# Patient Record
Sex: Female | Born: 1940 | Race: White | Hispanic: No | Marital: Married | Smoking: Never smoker
Health system: Southern US, Community
[De-identification: ages and names within clinical notes are randomized; demographics above are authoritative.]

## PROBLEM LIST (undated history)

## (undated) DIAGNOSIS — K760 Fatty (change of) liver, not elsewhere classified: Secondary | ICD-10-CM

## (undated) DIAGNOSIS — D509 Iron deficiency anemia, unspecified: Secondary | ICD-10-CM

## (undated) DIAGNOSIS — Z86018 Personal history of other benign neoplasm: Secondary | ICD-10-CM

## (undated) DIAGNOSIS — Z87442 Personal history of urinary calculi: Secondary | ICD-10-CM

## (undated) DIAGNOSIS — E119 Type 2 diabetes mellitus without complications: Secondary | ICD-10-CM

## (undated) DIAGNOSIS — Z8639 Personal history of other endocrine, nutritional and metabolic disease: Secondary | ICD-10-CM

## (undated) DIAGNOSIS — G459 Transient cerebral ischemic attack, unspecified: Secondary | ICD-10-CM

## (undated) DIAGNOSIS — I10 Essential (primary) hypertension: Secondary | ICD-10-CM

## (undated) DIAGNOSIS — E559 Vitamin D deficiency, unspecified: Secondary | ICD-10-CM

## (undated) DIAGNOSIS — H9190 Unspecified hearing loss, unspecified ear: Secondary | ICD-10-CM

## (undated) DIAGNOSIS — K269 Duodenal ulcer, unspecified as acute or chronic, without hemorrhage or perforation: Secondary | ICD-10-CM

## (undated) DIAGNOSIS — Z8619 Personal history of other infectious and parasitic diseases: Secondary | ICD-10-CM

## (undated) DIAGNOSIS — A809 Acute poliomyelitis, unspecified: Secondary | ICD-10-CM

## (undated) DIAGNOSIS — K219 Gastro-esophageal reflux disease without esophagitis: Secondary | ICD-10-CM

## (undated) DIAGNOSIS — K259 Gastric ulcer, unspecified as acute or chronic, without hemorrhage or perforation: Secondary | ICD-10-CM

## (undated) DIAGNOSIS — Z9989 Dependence on other enabling machines and devices: Secondary | ICD-10-CM

## (undated) DIAGNOSIS — J302 Other seasonal allergic rhinitis: Secondary | ICD-10-CM

## (undated) DIAGNOSIS — M6281 Muscle weakness (generalized): Secondary | ICD-10-CM

## (undated) DIAGNOSIS — Z8673 Personal history of transient ischemic attack (TIA), and cerebral infarction without residual deficits: Secondary | ICD-10-CM

## (undated) DIAGNOSIS — E039 Hypothyroidism, unspecified: Secondary | ICD-10-CM

## (undated) DIAGNOSIS — B91 Sequelae of poliomyelitis: Secondary | ICD-10-CM

## (undated) DIAGNOSIS — E785 Hyperlipidemia, unspecified: Secondary | ICD-10-CM

## (undated) DIAGNOSIS — M1990 Unspecified osteoarthritis, unspecified site: Secondary | ICD-10-CM

## (undated) DIAGNOSIS — G4733 Obstructive sleep apnea (adult) (pediatric): Secondary | ICD-10-CM

## (undated) DIAGNOSIS — K2970 Gastritis, unspecified, without bleeding: Secondary | ICD-10-CM

## (undated) DIAGNOSIS — I444 Left anterior fascicular block: Secondary | ICD-10-CM

## (undated) DIAGNOSIS — I4510 Unspecified right bundle-branch block: Secondary | ICD-10-CM

## (undated) HISTORY — DX: Fatty (change of) liver, not elsewhere classified: K76.0

## (undated) HISTORY — DX: Vitamin D deficiency, unspecified: E55.9

## (undated) HISTORY — PX: BACK SURGERY: SHX140

## (undated) HISTORY — DX: Hyperlipidemia, unspecified: E78.5

## (undated) HISTORY — PX: OTHER SURGICAL HISTORY: SHX169

## (undated) HISTORY — PX: TONSILLECTOMY: SUR1361

---

## 1964-05-10 HISTORY — PX: OVARIAN CYST SURGERY: SHX726

## 1982-05-10 HISTORY — PX: TOTAL ABDOMINAL HYSTERECTOMY W/ BILATERAL SALPINGOOPHORECTOMY: SHX83

## 1983-05-11 HISTORY — PX: CHOLECYSTECTOMY OPEN: SUR202

## 1997-05-10 HISTORY — PX: KNEE ARTHROSCOPY: SUR90

## 1998-07-14 ENCOUNTER — Ambulatory Visit (HOSPITAL_COMMUNITY): Admission: RE | Admit: 1998-07-14 | Discharge: 1998-07-14 | Payer: Self-pay | Admitting: Obstetrics & Gynecology

## 1998-11-12 ENCOUNTER — Emergency Department (HOSPITAL_COMMUNITY): Admission: EM | Admit: 1998-11-12 | Discharge: 1998-11-12 | Payer: Self-pay | Admitting: *Deleted

## 1999-08-10 ENCOUNTER — Encounter: Admission: RE | Admit: 1999-08-10 | Discharge: 1999-08-10 | Payer: Self-pay | Admitting: Obstetrics and Gynecology

## 1999-08-10 ENCOUNTER — Encounter: Payer: Self-pay | Admitting: Obstetrics and Gynecology

## 2000-03-30 ENCOUNTER — Other Ambulatory Visit: Admission: RE | Admit: 2000-03-30 | Discharge: 2000-03-30 | Payer: Self-pay | Admitting: Obstetrics and Gynecology

## 2000-11-01 ENCOUNTER — Encounter: Payer: Self-pay | Admitting: Emergency Medicine

## 2000-11-01 ENCOUNTER — Observation Stay (HOSPITAL_COMMUNITY): Admission: EM | Admit: 2000-11-01 | Discharge: 2000-11-02 | Payer: Self-pay | Admitting: Emergency Medicine

## 2000-11-17 ENCOUNTER — Encounter: Admission: RE | Admit: 2000-11-17 | Discharge: 2000-11-17 | Payer: Self-pay | Admitting: Family Medicine

## 2001-01-13 ENCOUNTER — Encounter: Admission: RE | Admit: 2001-01-13 | Discharge: 2001-01-13 | Payer: Self-pay | Admitting: Obstetrics and Gynecology

## 2001-01-13 ENCOUNTER — Encounter: Payer: Self-pay | Admitting: Obstetrics and Gynecology

## 2001-06-26 ENCOUNTER — Observation Stay (HOSPITAL_COMMUNITY): Admission: EM | Admit: 2001-06-26 | Discharge: 2001-06-27 | Payer: Self-pay | Admitting: Emergency Medicine

## 2001-06-26 ENCOUNTER — Encounter: Payer: Self-pay | Admitting: Family Medicine

## 2001-06-26 ENCOUNTER — Encounter: Payer: Self-pay | Admitting: Emergency Medicine

## 2001-10-09 ENCOUNTER — Ambulatory Visit (HOSPITAL_BASED_OUTPATIENT_CLINIC_OR_DEPARTMENT_OTHER): Admission: RE | Admit: 2001-10-09 | Discharge: 2001-10-09 | Payer: Self-pay | Admitting: *Deleted

## 2001-10-09 ENCOUNTER — Encounter: Payer: Self-pay | Admitting: Internal Medicine

## 2001-12-05 ENCOUNTER — Encounter: Payer: Self-pay | Admitting: Pulmonary Disease

## 2002-04-20 ENCOUNTER — Encounter: Payer: Self-pay | Admitting: *Deleted

## 2002-04-20 ENCOUNTER — Encounter: Admission: RE | Admit: 2002-04-20 | Discharge: 2002-04-20 | Payer: Self-pay | Admitting: *Deleted

## 2002-04-30 ENCOUNTER — Ambulatory Visit (HOSPITAL_COMMUNITY): Admission: RE | Admit: 2002-04-30 | Discharge: 2002-04-30 | Payer: Self-pay | Admitting: Gastroenterology

## 2003-06-21 ENCOUNTER — Emergency Department (HOSPITAL_COMMUNITY): Admission: EM | Admit: 2003-06-21 | Discharge: 2003-06-21 | Payer: Self-pay | Admitting: Emergency Medicine

## 2004-04-13 ENCOUNTER — Encounter: Admission: RE | Admit: 2004-04-13 | Discharge: 2004-04-13 | Payer: Self-pay | Admitting: *Deleted

## 2005-06-23 ENCOUNTER — Encounter: Admission: RE | Admit: 2005-06-23 | Discharge: 2005-06-23 | Payer: Self-pay | Admitting: Family Medicine

## 2006-05-20 ENCOUNTER — Ambulatory Visit: Payer: Self-pay | Admitting: Pulmonary Disease

## 2006-07-07 ENCOUNTER — Encounter (INDEPENDENT_AMBULATORY_CARE_PROVIDER_SITE_OTHER): Payer: Self-pay | Admitting: Specialist

## 2006-07-07 ENCOUNTER — Ambulatory Visit (HOSPITAL_BASED_OUTPATIENT_CLINIC_OR_DEPARTMENT_OTHER): Admission: RE | Admit: 2006-07-07 | Discharge: 2006-07-07 | Payer: Self-pay | Admitting: Orthopedic Surgery

## 2006-10-27 ENCOUNTER — Encounter: Admission: RE | Admit: 2006-10-27 | Discharge: 2006-10-27 | Payer: Self-pay | Admitting: Family Medicine

## 2007-05-10 ENCOUNTER — Ambulatory Visit (HOSPITAL_BASED_OUTPATIENT_CLINIC_OR_DEPARTMENT_OTHER): Admission: RE | Admit: 2007-05-10 | Discharge: 2007-05-10 | Payer: Self-pay | Admitting: Orthopedic Surgery

## 2007-05-10 HISTORY — PX: CARPAL TUNNEL RELEASE: SHX101

## 2007-06-01 DIAGNOSIS — R079 Chest pain, unspecified: Secondary | ICD-10-CM | POA: Insufficient documentation

## 2007-06-01 DIAGNOSIS — R0602 Shortness of breath: Secondary | ICD-10-CM | POA: Insufficient documentation

## 2007-06-02 ENCOUNTER — Ambulatory Visit: Payer: Self-pay | Admitting: Pulmonary Disease

## 2007-11-23 ENCOUNTER — Encounter: Admission: RE | Admit: 2007-11-23 | Discharge: 2007-11-23 | Payer: Self-pay | Admitting: Family Medicine

## 2008-04-08 ENCOUNTER — Inpatient Hospital Stay (HOSPITAL_COMMUNITY): Admission: RE | Admit: 2008-04-08 | Discharge: 2008-04-11 | Payer: Self-pay | Admitting: Orthopedic Surgery

## 2008-04-08 HISTORY — PX: TOTAL KNEE ARTHROPLASTY: SHX125

## 2008-05-06 ENCOUNTER — Encounter: Admission: RE | Admit: 2008-05-06 | Discharge: 2008-05-09 | Payer: Self-pay | Admitting: Orthopedic Surgery

## 2008-05-10 ENCOUNTER — Encounter: Admission: RE | Admit: 2008-05-10 | Discharge: 2008-06-25 | Payer: Self-pay | Admitting: Orthopedic Surgery

## 2008-05-31 ENCOUNTER — Ambulatory Visit: Payer: Self-pay | Admitting: Pulmonary Disease

## 2008-05-31 DIAGNOSIS — N83209 Unspecified ovarian cyst, unspecified side: Secondary | ICD-10-CM | POA: Insufficient documentation

## 2008-05-31 DIAGNOSIS — Z9089 Acquired absence of other organs: Secondary | ICD-10-CM | POA: Insufficient documentation

## 2008-05-31 DIAGNOSIS — E0590 Thyrotoxicosis, unspecified without thyrotoxic crisis or storm: Secondary | ICD-10-CM | POA: Insufficient documentation

## 2008-05-31 DIAGNOSIS — E785 Hyperlipidemia, unspecified: Secondary | ICD-10-CM

## 2008-05-31 DIAGNOSIS — G4733 Obstructive sleep apnea (adult) (pediatric): Secondary | ICD-10-CM | POA: Insufficient documentation

## 2008-05-31 DIAGNOSIS — E1169 Type 2 diabetes mellitus with other specified complication: Secondary | ICD-10-CM | POA: Insufficient documentation

## 2008-11-25 ENCOUNTER — Encounter: Admission: RE | Admit: 2008-11-25 | Discharge: 2008-11-25 | Payer: Self-pay | Admitting: Family Medicine

## 2009-12-18 ENCOUNTER — Encounter: Admission: RE | Admit: 2009-12-18 | Discharge: 2009-12-18 | Payer: Self-pay | Admitting: Obstetrics and Gynecology

## 2010-03-20 ENCOUNTER — Encounter: Admission: RE | Admit: 2010-03-20 | Discharge: 2010-03-20 | Payer: Self-pay | Admitting: Family Medicine

## 2010-04-17 ENCOUNTER — Emergency Department (HOSPITAL_COMMUNITY)
Admission: EM | Admit: 2010-04-17 | Discharge: 2010-04-17 | Payer: Self-pay | Source: Home / Self Care | Admitting: Emergency Medicine

## 2010-04-30 ENCOUNTER — Encounter
Admission: RE | Admit: 2010-04-30 | Discharge: 2010-04-30 | Payer: Self-pay | Source: Home / Self Care | Attending: Neurosurgery | Admitting: Neurosurgery

## 2010-05-14 ENCOUNTER — Encounter
Admission: RE | Admit: 2010-05-14 | Discharge: 2010-05-14 | Payer: Self-pay | Source: Home / Self Care | Attending: Neurosurgery | Admitting: Neurosurgery

## 2010-06-09 NOTE — Assessment & Plan Note (Signed)
Summary: f/u osa   Chief Complaint:  follow up.  History of Present Illness: the patient comes in today for follow-up of her known obstructive sleep apnea.  She has been wearing her CPAP compliantly, and has no issues with her mask or pressure.  She is sleeping well, and has good daytime alertness. the one  Current Allergies: ! * OXYCODINE ! DOXYCYCLINE      Vital Signs:  Patient Profile:   70 Years Old Female Weight:      237.38 pounds O2 Sat:      97 % O2 treatment:    Room Air Temp:     97.7 degrees F oral Pulse rate:   84 / minute BP sitting:   120 / 76  (left arm)  Vitals Entered By: Cyndia Diver LPN (June 02, 2007 10:29 AM)             Comments yearly follow up. pt states she is using her cpap every night. pt denied any complaints. meds reviewed.      Physical Exam  General:     obese female in no acute distress Nose:     no skin breakdown or pressure necrosis from a CPAP mask     Impression & Recommendations:  Problem # 1:  SLEEP APNEA (ICD-780.57) obstructive sleep apnea which seems to be well controlled on CPAP.  The patient is having no difficulties with the device, but understands that she needs to work aggressively on weight loss.  Follow-up will be in one year or sooner if problems.   Medications Added to Medication List This Visit: 1)  Vitamin E 400 Unit Caps (Vitamin e) .... Take two tabs (total of 800mg ) by mouth once daily 2)  Tricor 145 Mg Tabs (Fenofibrate) .... Take 1 tablet by mouth once a day 3)  Bayer Aspirin 325 Mg Tabs (Aspirin) .... Take 1 tablet by mouth once a day 4)  Multivitamins Tabs (Multiple vitamin) .... Take 1 tablet by mouth once a day 5)  Osteo Bi-flex Regular Strength 250-200 Mg Tabs (Glucosamine-chondroitin) .... Take 1 tablet by mouth two times a day 6)  Mucinex 600 Mg Tb12 (Guaifenesin) .... Take 1 tablet by mouth once a day 7)  Vitamin B Complex Caps (B complex vitamins) .... Take 1 tablet by mouth once a day    Patient Instructions: 1)  Please schedule a follow-up appointment in 1 year.    ]

## 2010-06-17 ENCOUNTER — Other Ambulatory Visit: Payer: Self-pay | Admitting: Neurosurgery

## 2010-06-17 DIAGNOSIS — M549 Dorsalgia, unspecified: Secondary | ICD-10-CM

## 2010-06-17 DIAGNOSIS — N320 Bladder-neck obstruction: Secondary | ICD-10-CM

## 2010-06-17 DIAGNOSIS — M5410 Radiculopathy, site unspecified: Secondary | ICD-10-CM

## 2010-06-22 ENCOUNTER — Ambulatory Visit
Admission: RE | Admit: 2010-06-22 | Discharge: 2010-06-22 | Disposition: A | Discharge status: Home / Self Care | Payer: MEDICARE | Source: Ambulatory Visit | Attending: Neurosurgery | Admitting: Neurosurgery

## 2010-06-22 DIAGNOSIS — M5410 Radiculopathy, site unspecified: Secondary | ICD-10-CM

## 2010-06-22 DIAGNOSIS — M549 Dorsalgia, unspecified: Secondary | ICD-10-CM

## 2010-06-22 DIAGNOSIS — N320 Bladder-neck obstruction: Secondary | ICD-10-CM

## 2010-07-21 LAB — COMPREHENSIVE METABOLIC PANEL
ALT: 41 U/L — ABNORMAL HIGH (ref 0–35)
AST: 22 U/L (ref 0–37)
Albumin: 3.4 g/dL — ABNORMAL LOW (ref 3.5–5.2)
Alkaline Phosphatase: 54 U/L (ref 39–117)
BUN: 25 mg/dL — ABNORMAL HIGH (ref 6–23)
CO2: 24 mEq/L (ref 19–32)
Calcium: 9.5 mg/dL (ref 8.4–10.5)
Chloride: 107 mEq/L (ref 96–112)
Creatinine, Ser: 0.9 mg/dL (ref 0.4–1.2)
GFR calc Af Amer: >60 mL/min (ref >60)
GFR calc non Af Amer: >60 mL/min (ref >60)
Glucose, Bld: 91 mg/dL (ref 70–99)
Potassium: 3.8 mEq/L (ref 3.5–5.1)
Sodium: 140 mEq/L (ref 135–145)
Total Bilirubin: 1 mg/dL (ref 0.3–1.2)
Total Protein: 5.9 g/dL — ABNORMAL LOW (ref 6.0–8.3)

## 2010-07-21 LAB — CBC
HCT: 38.1 % (ref 36.0–46.0)
Hemoglobin: 12.5 g/dL (ref 12.0–15.0)
MCH: 30.6 pg (ref 26.0–34.0)
MCHC: 32.8 g/dL (ref 30.0–36.0)
MCV: 93.4 fL (ref 78.0–100.0)
Platelets: 332 10*3/uL (ref 150–400)
RBC: 4.08 MIL/uL (ref 3.87–5.11)
RDW: 13.7 % (ref 11.5–15.5)
WBC: 21.9 10*3/uL — ABNORMAL HIGH (ref 4.0–10.5)

## 2010-07-21 LAB — URINALYSIS, ROUTINE W REFLEX MICROSCOPIC
Bilirubin Urine: NEGATIVE
Glucose, UA: NEGATIVE mg/dL
Hgb urine dipstick: NEGATIVE
Ketones, ur: NEGATIVE mg/dL
Nitrite: NEGATIVE
Protein, ur: NEGATIVE mg/dL
Specific Gravity, Urine: 1.02 (ref 1.005–1.030)
Urobilinogen, UA: 1 mg/dL (ref 0.0–1.0)
pH: 6 (ref 5.0–8.0)

## 2010-07-21 LAB — URINE MICROSCOPIC-ADD ON

## 2010-07-21 LAB — DIFFERENTIAL
Basophils Absolute: 0 10*3/uL (ref 0.0–0.1)
Basophils Relative: 0 % (ref 0–1)
Eosinophils Absolute: 0.1 10*3/uL (ref 0.0–0.7)
Eosinophils Relative: 1 % (ref 0–5)
Lymphocytes Relative: 23 % (ref 12–46)
Lymphs Abs: 5.1 10*3/uL — ABNORMAL HIGH (ref 0.7–4.0)
Monocytes Absolute: 3.2 10*3/uL — ABNORMAL HIGH (ref 0.1–1.0)
Monocytes Relative: 15 % — ABNORMAL HIGH (ref 3–12)
Neutro Abs: 13.5 10*3/uL — ABNORMAL HIGH (ref 1.7–7.7)
Neutrophils Relative %: 62 % (ref 43–77)

## 2010-07-21 LAB — LIPASE, BLOOD: Lipase: 29 U/L (ref 11–59)

## 2010-08-28 ENCOUNTER — Ambulatory Visit (HOSPITAL_COMMUNITY)
Admission: RE | Admit: 2010-08-28 | Discharge: 2010-08-28 | Disposition: A | Discharge status: Home / Self Care | Payer: Medicare Other | Source: Ambulatory Visit | Attending: Neurosurgery | Admitting: Neurosurgery

## 2010-08-28 ENCOUNTER — Encounter (HOSPITAL_COMMUNITY)
Admission: RE | Admit: 2010-08-28 | Discharge: 2010-08-28 | Disposition: A | Discharge status: Home / Self Care | Payer: Medicare Other | Source: Ambulatory Visit | Attending: Neurosurgery | Admitting: Neurosurgery

## 2010-08-28 ENCOUNTER — Other Ambulatory Visit (HOSPITAL_COMMUNITY): Payer: Self-pay | Admitting: Neurosurgery

## 2010-08-28 DIAGNOSIS — Z01818 Encounter for other preprocedural examination: Secondary | ICD-10-CM | POA: Insufficient documentation

## 2010-08-28 DIAGNOSIS — Z01811 Encounter for preprocedural respiratory examination: Secondary | ICD-10-CM

## 2010-08-28 DIAGNOSIS — Z01812 Encounter for preprocedural laboratory examination: Secondary | ICD-10-CM | POA: Insufficient documentation

## 2010-08-28 DIAGNOSIS — Z01810 Encounter for preprocedural cardiovascular examination: Secondary | ICD-10-CM | POA: Insufficient documentation

## 2010-08-28 LAB — BASIC METABOLIC PANEL
BUN: 14 mg/dL (ref 6–23)
CO2: 27 mEq/L (ref 19–32)
Calcium: 10.4 mg/dL (ref 8.4–10.5)
Chloride: 108 mEq/L (ref 96–112)
Creatinine, Ser: 0.91 mg/dL (ref 0.4–1.2)
GFR calc Af Amer: >60 mL/min (ref >60)
GFR calc non Af Amer: >60 mL/min (ref >60)
Glucose, Bld: 103 mg/dL — ABNORMAL HIGH (ref 70–99)
Potassium: 4 mEq/L (ref 3.5–5.1)
Sodium: 140 mEq/L (ref 135–145)

## 2010-08-28 LAB — CBC
HCT: 39.3 % (ref 36.0–46.0)
Hemoglobin: 13.5 g/dL (ref 12.0–15.0)
MCH: 30.6 pg (ref 26.0–34.0)
MCHC: 34.4 g/dL (ref 30.0–36.0)
MCV: 89.1 fL (ref 78.0–100.0)
Platelets: 315 10*3/uL (ref 150–400)
RBC: 4.41 MIL/uL (ref 3.87–5.11)
RDW: 13.1 % (ref 11.5–15.5)
WBC: 6.6 10*3/uL (ref 4.0–10.5)

## 2010-08-28 LAB — SURGICAL PCR SCREEN
MRSA, PCR: NEGATIVE
Staphylococcus aureus: POSITIVE — AB

## 2010-09-01 ENCOUNTER — Inpatient Hospital Stay (HOSPITAL_COMMUNITY)
Admission: RE | Admit: 2010-09-01 | Discharge: 2010-09-07 | DRG: 460 | Disposition: A | Discharge status: Home Health | Payer: Medicare Other | Source: Ambulatory Visit | Attending: Neurosurgery | Admitting: Neurosurgery

## 2010-09-01 ENCOUNTER — Inpatient Hospital Stay (HOSPITAL_COMMUNITY): Payer: Medicare Other

## 2010-09-01 DIAGNOSIS — M5137 Other intervertebral disc degeneration, lumbosacral region: Secondary | ICD-10-CM | POA: Diagnosis present

## 2010-09-01 DIAGNOSIS — M51379 Other intervertebral disc degeneration, lumbosacral region without mention of lumbar back pain or lower extremity pain: Secondary | ICD-10-CM | POA: Diagnosis present

## 2010-09-01 DIAGNOSIS — Z7982 Long term (current) use of aspirin: Secondary | ICD-10-CM

## 2010-09-01 DIAGNOSIS — G4733 Obstructive sleep apnea (adult) (pediatric): Secondary | ICD-10-CM | POA: Diagnosis present

## 2010-09-01 DIAGNOSIS — M47817 Spondylosis without myelopathy or radiculopathy, lumbosacral region: Secondary | ICD-10-CM | POA: Diagnosis present

## 2010-09-01 DIAGNOSIS — Q762 Congenital spondylolisthesis: Principal | ICD-10-CM

## 2010-09-01 DIAGNOSIS — Z79899 Other long term (current) drug therapy: Secondary | ICD-10-CM

## 2010-09-01 DIAGNOSIS — I10 Essential (primary) hypertension: Secondary | ICD-10-CM | POA: Diagnosis present

## 2010-09-01 HISTORY — PX: LUMBAR SPINE SURGERY: SHX701

## 2010-09-01 LAB — TYPE AND SCREEN
ABO/RH(D): O POS
Antibody Screen: NEGATIVE

## 2010-09-01 LAB — ABO/RH: ABO/RH(D): O POS

## 2010-09-13 NOTE — Op Note (Signed)
NAMEAKHILA, MAHNKEN              ACCOUNT NO.:  [id number]  MEDICAL RECORD NO.:  [id number]           PATIENT TYPE:  I  LOCATION:  3014                         FACILITY:  [facility]  PHYSICIAN:  Hilda Lias, M.D.   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  L3-L4 foraminal stenosis, L5-S1 foraminal stenosis.  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  L3-L4 foraminal stenosis, L5-S1 foraminal stenosis.  PROCEDURE:  L4 Gill procedure with laminectomy, facetectomy, and bilateral L4-5 foraminotomy as well L3 laminectomy with bilateral L3-L4 and L5-S1 foraminotomy, decompression of the L4 and L5 nerve root required for interbody arthrodesis with microdissection, bilateral L4-5 interbody arthrodesis with cages 12 x 22, morselized autograft with Osteocel L4-5 screws, posterolateral arthrodesis with Osteocel and bone marrow aspirate.  Microscope.  SURGEON:  Hilda Lias, MD  ASSISTANT:  Hewitt Shorts, MD  CLINICAL HISTORY:  The patient was admitted because of back pain with radiation to both legs.  The pain is getting worse, is associated with walking.  Clinically, she had failed with conservative treatment.  She has femoral stretch maneuver positive bilaterally.  X-rays show grade 1 spondylolisthesis at the level of L4-L5, foraminal narrowing at the level of 3-4 and L5-S1.  Surgery was advised and the risks were explained to her and her family.  PROCEDURE:  The patient was taken to the OR and she was positioned in prone manner.  The back was cleaned with DuraPrep.  Drapes  were applied. Midline incision was made.  We were unable to feel any bony structure because of her obesity.  Incision was carried out, and muscles were retracted laterally.  We identified the L5-S1 space and we went all the way up to the L3-L4 space.  X-rays were taken which showed that indeed we were at the level of L4-L5.  From then on, we started doing the [place] procedure with removal of the spinous process of L4, the lamina, and the facet all the way laterally to allow [place] to get into the disk space.  The patient has quite a bit of thick yellow ligament.  When we looked at the space above L4, we found that the ligament continued and we decided to proceed with laminectomy of L3 and from then on we were able to do foraminotomy of L3-L4.  While we waited for the C-arm, we proceeded with laminotomy of L5-S1 with removal of part of the yellow ligament and foraminotomy bilaterally to decompress the S1 nerve root.  This procedure was done bilaterally.  Then after we did a facetectomy, we went straight and we opened the disk space first in the right side and then the left in the left side.  Incision was made in the annulus, and using microcurette as well as the pituitary rongeur total diskectomy was accomplished.  Then, we introduced shavers at 8, 10,  and up to 12 to be able to do a total diskectomy.  At the end, we have not only good decompression of the thecal sac but also good decompression of the L4-L5 nerve root.  Then, we retracted the thecal sac on one side and the other one in the end and later in the right side.  We introduced two PEEK implant with Osteocel and autograft inside.  AP and lateral showed good position of the graft.  Then using the C-arm first in AP view and then lateral view, we probed the pedicle of L4 and L5.  Then, we aspirated bone narrow at two levels and we used the bone marrow to mix with the Osteocel and autograft.  Then, we introduced four screws of 5.5 x 45. AP  and lateral showed good position of the pedicle screws.  Then using a rod and four caps, we kept the rod in place.  Once again, the x-rays were done and we had good decompression.  We brought the microscope into the area.  We investigated the L4 and L5 nerve root and we had plenty of space.  Valsalva maneuver was done up to 50 and was negative.  Then, we went laterally and we removed the periosteum of the facet of L4-L5, also part of the takeoff of the transverse ligament.  Then, a mix of autograft, Osteocel, and bone aspirate was used for postop arthrodesis. From then on, the wound was closed with three different layers of Vicryl and the skin with Steri-Strips.  Infiltration of the muscle was done with Sensorcaine 1%.  The patient is going to go to PACU.          ______________________________ Hilda Lias, M.D.     EB/MEDQ  D:  09/01/2010  T:  09/01/2010  Job:  [id number]  Electronically Signed by Hilda Lias M.D. on 09/13/2010 08:11:48 PM

## 2010-09-13 NOTE — Discharge Summary (Signed)
  NAMEKIRSTIE, Karen Potter              ACCOUNT NO.:  [id number]  MEDICAL RECORD NO.:  [id number]           PATIENT TYPE:  I  LOCATION:  3014                         FACILITY:  [facility]  PHYSICIAN:  Hilda Lias, M.D.   DATE OF BIRTH:  [birth year]  DATE OF ADMISSION:  09/01/2010 DATE OF DISCHARGE:  09/07/2010                              DISCHARGE SUMMARY   ADMISSION DIAGNOSIS:  L4-L5 spondylolisthesis with a lumbar stenosis L3- L4 and L4-L5.  FINAL DIAGNOSIS:  L4-L5 spondylolisthesis with a lumbar stenosis L3-L4 and L4-L5.  CLINICAL HISTORY:  The patient was admitted because of back pain with radiation to both legs.  X-ray showed grade 1 spondylolisthesis at the level of L4-L5 with foraminal narrowing at the level of L3-L4 and L5-S1. Surgery was advised.  Laboratory normal.  HOSPITAL COURSE:  The patient was taken to Surgery.  L4-L5 fusion, L3 laminectomy, and L5-S1 foraminotomy was accomplished.  Today, she is feeling better.  She is ambulating.  She had no fever.  The wound was healed.  She is being discharged to be followed by me in my office.  CONDITION ON DISCHARGE:  Improvement.  MEDICATION:  Percocet and diazepam.  DIET:  Regular.  ACTIVITY:  Not to drive.  He is not to do any heavy lifting.  FOLLOWUP:  To be seen by me in 3-4 weeks.          ______________________________ Hilda Lias, M.D.     EB/MEDQ  D:  09/07/2010  T:  09/07/2010  Job:  [id number]  Electronically Signed by Hilda Lias M.D. on 09/13/2010 08:11:50 PM

## 2010-09-22 NOTE — Discharge Summary (Signed)
NAMEGILLIE, Karen Potter              ACCOUNT NO.:  [id number]   MEDICAL RECORD NO.:  [id number]          PATIENT TYPE:  INP   LOCATION:  1620                         FACILITY:  [facility]   PHYSICIAN:  Ollen Gross, M.D.    DATE OF BIRTH:  05-28-1940   DATE OF ADMISSION:  04/08/2008  DATE OF DISCHARGE:  04/11/2008                               DISCHARGE SUMMARY   ADMISSION DIAGNOSES:  1. Osteoarthritis right knee.  2. History of polio left leg.  3. Migraines.  4. History of bronchitis.  5. Hypertension.  6. History of sleep apnea, uses continuous positive airway pressure.  7. Hypercholesterolemia.  8. History of childhood jaundice.  9. Urinary incontinence.  10.Hypothyroidism.  11.Childhood illnesses of measles and mumps.   DISCHARGE DIAGNOSES:  1. Osteoarthritis right knee status post right total knee replacement      arthroplasty.  2. Postoperative hyponatremia, improving.  3. Postoperative hypokalemia, improved.  4. History of polio left leg.  5. Migraines.  6. History of bronchitis.  7. Hypertension.  8. History of sleep apnea, uses continuous positive airway pressure.  9. Hypercholesterolemia.  10.History of childhood jaundice.  11.Urinary incontinence.  12.Hypothyroidism.  13.Childhood illnesses of measles and mumps.   PROCEDURE:  April 08, 2008, right total knee.   SURGEON:  Ollen Gross, M.D.   ASSISTANT:  Jamelle Rushing, P.A.   ANESTHESIA:  General.   TIME:  41 minutes.   CONSULTATIONS:  None.   BRIEF HISTORY:  Karen Potter is a 70 year old female with end-stage  arthritis of the right knee, progressive worsening pain and dysfunction,  failed outpatient management now presents for total knee.   LABORATORY DATA:  Preop CBC showed hemoglobin 13.4, hematocrit 38.9,  white cell count 7.1, platelets 273.  Postop hemoglobin 11.9, came back  to 12.8, last noted H&H 12.4 and 36.3.  PT/PTT preop 13.8 and 27  respectively.  INR 1.0.  Serial pro-times followed  with  Coumadin  protocol.  Last PT/INR 21.1 and 1.7.  Chem panel on admission all within  normal limits.  Serial BMETs were followed.  Sodium did drop from 139 to  132, back up to 133, potassium dropped from 3.5 to 3.3and  back up to  3.8, glucose went up from 104 to 156 and back down to 148.  Preop UA:  Small leukocyte esterase, few epithelials, only 3-6 white cells, only 0-  2 red cells, rare bacteria, otherwise negative.  Blood type O+.   DIAGNOSTICS:  Chest x-ray two-view April 01, 2008:  No active  cardiopulmonary disease.   HOSPITAL COURSE:  The patient admitted to [facility] and  tolerated the procedure well, later transferred to the recovery room on  the orthopedic floor.  Started on PCA and p.o. analgesics.  Given 24  hours postop IV antibiotics.  Started on Coumadin for DVT prophylaxis.  Doing pretty well on the morning of day 1 except for some upper  abdominal pain, leg was doing fine.  Sodium and potassium were a little  low.  Had a little epigastric discomfort.  Denied any chest pain or  shortness of breath.  Patient felt it was some reflux and heartburn, so  we started her on Protonix and provided other p.r.n. meds which did  resolve the issue.  Had excellent urinary output.  Started back on home  meds.  Gave a little potassium for the low potassium level and decreased  the fluids because of  hyponatremia.  By day 2, the patient was doing  well with walking, walking greater 60 feet and then up to 120 feet.  Pain was under better control.  The sodium and potassium had both  improved.  Dressing changed, incision looked good.  Continued to meet  goals with therapy and by April 11, 2008 the sodium was back up.  Hemoglobin was stable, tolerating meds, meeting goals with therapy and  was discharged home.   DISCHARGE/PLAN:  Patient discharged home on May 08, 2008.   DISCHARGE MEDICATIONS:  1. Dilaudid.  2. Robaxin.  3. Imitrex.  4. Coumadin.   DIET:   Heart-healthy diet.   ACTIVITY:  Weightbearing as tolerated total knee protocol.  Home health  PT with home health nursing.   DISPOSITION:  Home.   CONDITION ON DISCHARGE:  Improving.      Alexzandrew L. Perkins, P.A.C.      Ollen Gross, M.D.  Electronically Signed    ALP/MEDQ  D:  05/23/2008  T:  05/23/2008  Job:  [id number]   cc:   Ollen Gross, M.D.  Fax: [phone]   S. Kyra Manges, M.D.  Fax: [phone]   Bennie Pierini, MD

## 2010-09-22 NOTE — Op Note (Signed)
Karen Potter, Karen Potter              ACCOUNT NO.:  [id number]   MEDICAL RECORD NO.:  [id number]          PATIENT TYPE:  INP   LOCATION:  0003                         FACILITY:  [facility]   PHYSICIAN:  Ollen Gross, M.D.    DATE OF BIRTH:  [birth year]   DATE OF PROCEDURE:  04/08/2008  DATE OF DISCHARGE:                               OPERATIVE REPORT   PREOPERATIVE DIAGNOSIS:  Osteoarthritis, right knee.   POSTOPERATIVE DIAGNOSIS/:  Osteoarthritis, right knee.   PROCEDURE:  Right total knee arthroplasty.   SURGEON:  Ollen Gross, M.D.   ASSISTANT:  Jamelle Rushing, P.A.   ANESTHESIA:  General with postop Marcaine pain pump.   ESTIMATED BLOOD LOSS:  Minimal.   DRAINS:  None.   TOURNIQUET TIME:  41 minutes at 300 mmHg.   COMPLICATIONS:  None.   CONDITION:  Stable to recovery.   BRIEF CLINICAL NOTE:  Ms. Trindade is a 70 year old female with end-stage  arthritis to the right knee with progressively worsening pain and  dysfunction.  She has failed nonoperative management and presents for  total knee arthroplasty.   PROCEDURE IN DETAIL:  After successful administration of general  anesthetic a tourniquet was placed high on the right thigh and the right  lower extremity prepped and draped in the usual sterile fashion.  The  extremity was wrapped in an Esmarch, the knee flexed and tourniquet  inflated to 300 mmHg.  A midline incision was made with a 10 blade  through subcutaneous tissue to the level of the extensor mechanism.  A  fresh blade was used to make a medial parapatellar arthrotomy.  The soft  tissue over the proximal medial tibia was subperiosteally elevated to  the joint line with the knife and at the semimembranous bursa with a  Cobb elevator.  The soft tissue laterally was elevated with attention  being paid to avoid the patellar tendon on the tibial tubercle.  The  patella was subluxed laterally and the knee flexed 90 degrees and the  ACL and PCL were removed.  The  drill was used to create a starting hole  in the distal femur.  The canal was thoroughly irrigated.  The 5-degree  right valgus alignment guide was placed.  Referencing off the posterior  condyle rotation was marked and the block pinned to remove 11 mm of the  distal femur.  The distal femoral resection was made with an oscillating  saw.  The sizing block was placed and a size 4 was the most appropriate.  Rotation was marked at the epicondylar axis.  A size 4 cutting block was  placed and the anterior, posterior and chamfer cuts were made.   The tibia was subluxed forward and menisci removed.  The extramedullary  tibial alignment guide was placed, referencing proximally at the medial  aspect of the tibial tubercle and distally along the second metatarsal  axis of the tibial crest.  The block was pinned to remove 10 mm of the  nondeficient lateral side.  The tibial resection was made with an  oscillating saw.  The sizing block was placed.  A size  3 was most  appropriate.  The size 3 template was placed and then proximal tibia was  prepared with the modular drill and keel punch.  The femoral preparation  was completed with the intercondylar cut for the size 4.   A size 3 mobile-bearing tibial trial and a size 4 posterior stabilized  femoral trial and a 10 mm posterior stabilized rotating platform insert  trial were placed.  With the 10 there was a tiny bit of hyperextension,  so we went to 12.5 which allowed for full extension with excellent  varus, valgus, anterior and posterior balance throughout full range of  motion.  The patella was then everted and thickness measured to be 24  mm.  Free-hand resection was taken to 14 mm, a 38 template was placed,  lug holes were drilled, trial patella was placed and it tracks normally.  The osteophytes were removed at the posterior femur with the trial  placed.  All trials were removed and the cut bone surfaces were prepared  with pulsatile lavage.   Cement was mixed and once ready for implantation  the size 3 mobile-bearing tibial tray, size 4 posterior stabilized femur  and 38 patella were cemented into place and the patella was held with a  clamp.  The trial of 12.5 insert was placed with the knee in full  extension and all extruded cement removed.  When the cement was fully  hardened then the permanent 12.5 mm posterior stabilized rotating  platform insert was placed in the tibial tray.  The wound was copiously  irrigated with saline solution and FloSeal injected on the posterior  capsule, medial and lateral gutters and suprapatellar area.  A moist  sponge was placed and the tourniquet released with a total time of 41  minutes.  The sponge was held for 2 minutes and removed.  Minimal  bleeding was encountered.  The bleeding that was encountered was stopped  with electrocautery.  The wound was again irrigated and the arthrotomy  closed with interrupted #1 PDS.  Flexion against gravity was 140  degrees.  The subcutaneous was then closed with interrupted 2-0 Vicryl  and subcuticular running 4-0 Monocryl.  The catheter for the Marcaine  pain pump was placed and the pump was initiated.  Steri-Strips and a  bulky sterile dressing were applied and then she was placed into a knee  immobilizer, awakened and transported to recovery in stable condition.      Ollen Gross, M.D.  Electronically Signed     FA/MEDQ  D:  04/08/2008  T:  04/08/2008  Job:  [id number]

## 2010-09-22 NOTE — Op Note (Signed)
Karen Potter, Karen Potter              ACCOUNT NO.:  [id number]   MEDICAL RECORD NO.:  [id number]          PATIENT TYPE:  AMB   LOCATION:  DSC                          FACILITY:  [facility]   PHYSICIAN:  Cindee Salt, M.D.       DATE OF BIRTH:  [birth year]   DATE OF PROCEDURE:  05/10/2007  DATE OF DISCHARGE:                               OPERATIVE REPORT   PREOPERATIVE DIAGNOSIS:  Carpal tunnel syndrome, right hand.   POSTOPERATIVE DIAGNOSIS:  Carpal tunnel syndrome, right hand.   OPERATION:  Decompression of right median nerve.   SURGEON:  Kuzma   ASSISTANT:  __________   ANESTHESIA:  Forearm-based IV regional.   ANESTHESIOLOGIST:  Singer   HISTORY:  The patient is a 70 year old female with a history of carpal  tunnel syndrome, EMG nerve conduction positive, that has not responded  to conservative treatment.  She has elected to undergo surgical  decompression of this.  She is aware of risks and complications  including infection; recurrence; injury to arteries, nerves, or tendons;  incomplete relief of symptoms; and dystrophy.  Questions have been  encouraged and answered.  In the Preoperative Area the patient was seen  and the extremity marked by both the patient and surgeon.  Questions  were again encouraged and answered to her satisfaction.   PROCEDURE:  The patient was brought to the Operating Room, where a  forearm-based IV regional anesthetic was carried out without difficulty.  She was prepped using DuraPrep in the supine position with the right arm  free.  A longitudinal incision was made in the palm and carried down  through subcutaneous tissue.  Bleeders were electrocauterized.  The  palmar fascia was split, and the superficial palmar arch was identified.  The flexor tendon to the ring and little finger identified tothe ulnar  side of the median nerve.  The carpal retinaculum was incised with sharp  dissection, right angle and Sewall retractor placed between the skin and  forearm fascia.  The fascia was released for approximately 1.5 cm  proximal to the wrist crease under direct vision.  The canal was  explored.  Area of compression to the nerve was apparent.  No further  lesions were identified.  The wound was irrigated and the skin closed  with interrupted 5-0 Vicryl Rapide sutures.  A sterile compressive  dressing and splint were applied to the wrist with fingers free.  The  patient tolerated the procedure well and was taken to the Recovery Room  for observation in satisfactory condition.  She is discharged home.  Return to __________ in [place] in one week on [place] [address].          ______________________________  Cindee Salt, M.D.    GK/MEDQ  D:  05/10/2007  T:  05/10/2007  Job:  [id number]

## 2010-09-25 NOTE — H&P (Signed)
NAMEJAVAEH, Karen Potter              ACCOUNT NO.:  [id number]   MEDICAL RECORD NO.:  [id number]          PATIENT TYPE:  INP   LOCATION:  NA                           FACILITY:  [facility]   PHYSICIAN:  Ollen Gross, M.D.    DATE OF BIRTH:  31-Mar-1941   DATE OF ADMISSION:  04/08/2008  DATE OF DISCHARGE:                              HISTORY & PHYSICAL   CHIEF COMPLAINT:  Right knee pain.   HISTORY OF PRESENT ILLNESS:  The patient is a 70 year old female who has  seen by Dr. Lequita Halt for ongoing right knee pain for quite some time now.  She initially had a knee scope back in 1999 per Dr. Rennis Chris.  Did well  for that for about 3 or 4 years.  For the past couple of years though  the knee has been starting to get worse.  She has been seen in followup  by Dr. 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  Risks and benefits discussed.  The patient is  subsequently admitted to the hospital.   ALLERGIES:  OXYCODONE causes itching, and questionable reaction to  DOXYCYCLINE.   CURRENT MEDICATIONS:  1. Levothyroxine 150 mcg.  2. Indapamide 25 mg.  3. Tricor 145 mg.  4. Propionate ointment.  5. Aspirin.  6. Vitamin D.  7. Estra-C.  8. Vitamin E.  9. Slow magnesium.  10.Osteo Bi-Flex.  11.Multivitamin.  12.Potassium gluconate.  13.Mucinex.   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  Elevated cholesterol, history of childhood jaundice,  urinary incontinence, hypothyroidism, childhood illnesses of measles and  mumps.   PAST SURGICAL HISTORY:  Ovarian cyst surgery, hysterectomy, gallbladder,  heel surgery secondary to polio, ankle surgery, right knee surgery.   FAMILY HISTORY:  Father deceased age 53 with lung cancer.  Mother  deceased age 12 with breathing  problems and heart disease.  Brother is  living at 65 with lung cancer.   SOCIAL HISTORY:  Married, retired nonsmoker.  No alcohol.  Has 2 sons  and 2 adopted daughters.  Husband and daughter will be assisting with  care after surgery.  She does have a living will.   REVIEW OF SYSTEMS:  GENERAL:  No fevers, chills, or night sweats.  NEURO:  There is a little bit of hearing loss, balance problems.  No  seizures, syncope, or paralysis.  RESPIRATORY:  A little bit of  shortness breath on exertion.  No shortness of breath at rest.  No  productive cough or hemoptysis.  CARDIOVASCULAR:  No chest pain, angina,  or orthopnea.  GI:  No nausea, vomiting, diarrhea, or constipation.  GU:  No dysuria, hematuria, or discharge.  MUSCULOSKELETAL:  Knee pain and  swelling.   PHYSICAL EXAMINATION:  VITAL SIGNS:  Pulse 74, respiration 16, blood  pressure 140/84.  GENERAL:  A 70 year old white female well nourished, well developed,  overweight, no acute distress.  She is alert, oriented, and cooperative,  pleasant.  Good historian.  HEENT:  Normocephalic, atraumatic.  Pupils are reactive.  Oropharynx  clear.  EOMs intact.  NECK:  Supple.  CHEST:  Clear.  HEART:  Regular rate and rhythm.  No murmur.  ABDOMEN:  Protuberant abdomen.  Bowel sounds present, nontender.  RECTAL, BREASTS, GENITALIA:  Not done, not pertinent to present illness.  EXTREMITIES:  Right knee range of motion 5-120, varus deformity, tender  more medial than lateral.  No instability.   IMPRESSION:  Osteoarthritis right knee.   PLAN:  The patient will be admitted to [facility] to undergo a  right total knee surgery, will be performed by Dr. Ollen Gross.      Alexzandrew L. Perkins, P.A.C.      Ollen Gross, M.D.  Electronically Signed    ALP/MEDQ  D:  04/07/2008  T:  04/07/2008  Job:  [id number]   cc:   S. Kyra Manges, M.D.  Fax: [phone]   Bennie Pierini, M.D.

## 2010-11-23 ENCOUNTER — Ambulatory Visit: Payer: Medicare Other | Attending: Neurosurgery | Admitting: Physical Therapy

## 2010-11-23 DIAGNOSIS — IMO0001 Reserved for inherently not codable concepts without codable children: Secondary | ICD-10-CM | POA: Insufficient documentation

## 2010-11-23 DIAGNOSIS — M545 Low back pain, unspecified: Secondary | ICD-10-CM | POA: Insufficient documentation

## 2010-11-23 DIAGNOSIS — R5381 Other malaise: Secondary | ICD-10-CM | POA: Insufficient documentation

## 2010-11-25 ENCOUNTER — Ambulatory Visit: Payer: Medicare Other | Admitting: Physical Therapy

## 2010-12-30 ENCOUNTER — Other Ambulatory Visit: Payer: Self-pay | Admitting: Family Medicine

## 2010-12-30 DIAGNOSIS — Z139 Encounter for screening, unspecified: Secondary | ICD-10-CM

## 2011-01-14 ENCOUNTER — Ambulatory Visit (HOSPITAL_COMMUNITY)
Admission: RE | Admit: 2011-01-14 | Discharge: 2011-01-14 | Disposition: A | Discharge status: Home / Self Care | Payer: Medicare Other | Source: Ambulatory Visit | Attending: Family Medicine | Admitting: Family Medicine

## 2011-01-14 DIAGNOSIS — Z1231 Encounter for screening mammogram for malignant neoplasm of breast: Secondary | ICD-10-CM | POA: Insufficient documentation

## 2011-01-14 DIAGNOSIS — Z139 Encounter for screening, unspecified: Secondary | ICD-10-CM

## 2011-02-09 LAB — TYPE AND SCREEN
ABO/RH(D): O POS
Antibody Screen: NEGATIVE

## 2011-02-09 LAB — URINALYSIS, ROUTINE W REFLEX MICROSCOPIC
Bilirubin Urine: NEGATIVE
Glucose, UA: NEGATIVE mg/dL
Hgb urine dipstick: NEGATIVE
Ketones, ur: NEGATIVE mg/dL
Nitrite: NEGATIVE
Protein, ur: NEGATIVE mg/dL
Specific Gravity, Urine: 1.017 (ref 1.005–1.030)
Urobilinogen, UA: 1 mg/dL (ref 0.0–1.0)
pH: 5.5 (ref 5.0–8.0)

## 2011-02-09 LAB — CBC
HCT: 38.9 % (ref 36.0–46.0)
Hemoglobin: 13.4 g/dL (ref 12.0–15.0)
MCHC: 34.5 g/dL (ref 30.0–36.0)
MCV: 90.1 fL (ref 78.0–100.0)
Platelets: 273 10*3/uL (ref 150–400)
RBC: 4.32 MIL/uL (ref 3.87–5.11)
RDW: 13.9 % (ref 11.5–15.5)
WBC: 7.1 10*3/uL (ref 4.0–10.5)

## 2011-02-09 LAB — COMPREHENSIVE METABOLIC PANEL
ALT: 29 U/L (ref 0–35)
AST: 30 U/L (ref 0–37)
Albumin: 3.9 g/dL (ref 3.5–5.2)
Alkaline Phosphatase: 61 U/L (ref 39–117)
BUN: 12 mg/dL (ref 6–23)
CO2: 26 mEq/L (ref 19–32)
Calcium: 10 mg/dL (ref 8.4–10.5)
Chloride: 105 mEq/L (ref 96–112)
Creatinine, Ser: 0.88 mg/dL (ref 0.4–1.2)
GFR calc Af Amer: >60 mL/min (ref >60)
GFR calc non Af Amer: >60 mL/min (ref >60)
Glucose, Bld: 104 mg/dL — ABNORMAL HIGH (ref 70–99)
Potassium: 3.5 mEq/L (ref 3.5–5.1)
Sodium: 139 mEq/L (ref 135–145)
Total Bilirubin: 0.8 mg/dL (ref 0.3–1.2)
Total Protein: 7.5 g/dL (ref 6.0–8.3)

## 2011-02-09 LAB — URINE MICROSCOPIC-ADD ON

## 2011-02-09 LAB — ABO/RH: ABO/RH(D): O POS

## 2011-02-09 LAB — PROTIME-INR
INR: 1 (ref 0.00–1.49)
Prothrombin Time: 13.8 seconds (ref 11.6–15.2)

## 2011-02-09 LAB — APTT: aPTT: 27 seconds (ref 24–37)

## 2011-02-12 LAB — CBC
HCT: 35 % — ABNORMAL LOW (ref 36.0–46.0)
HCT: 36.3 % (ref 36.0–46.0)
HCT: 36.5 % (ref 36.0–46.0)
Hemoglobin: 11.9 g/dL — ABNORMAL LOW (ref 12.0–15.0)
Hemoglobin: 12.4 g/dL (ref 12.0–15.0)
Hemoglobin: 12.8 g/dL (ref 12.0–15.0)
MCHC: 34.1 g/dL (ref 30.0–36.0)
MCHC: 34.3 g/dL (ref 30.0–36.0)
MCHC: 35 g/dL (ref 30.0–36.0)
MCV: 91.3 fL (ref 78.0–100.0)
MCV: 91.5 fL (ref 78.0–100.0)
MCV: 92.3 fL (ref 78.0–100.0)
Platelets: 235 10*3/uL (ref 150–400)
Platelets: 245 10*3/uL (ref 150–400)
Platelets: 259 10*3/uL (ref 150–400)
RBC: 3.83 MIL/uL — ABNORMAL LOW (ref 3.87–5.11)
RBC: 3.93 MIL/uL (ref 3.87–5.11)
RBC: 3.99 MIL/uL (ref 3.87–5.11)
RDW: 13.6 % (ref 11.5–15.5)
RDW: 13.8 % (ref 11.5–15.5)
RDW: 13.9 % (ref 11.5–15.5)
WBC: 12.1 10*3/uL — ABNORMAL HIGH (ref 4.0–10.5)
WBC: 12.5 10*3/uL — ABNORMAL HIGH (ref 4.0–10.5)
WBC: 13.8 10*3/uL — ABNORMAL HIGH (ref 4.0–10.5)

## 2011-02-12 LAB — POCT HEMOGLOBIN-HEMACUE
Hemoglobin: 14.5
Operator id: 112821

## 2011-02-12 LAB — PROTIME-INR
INR: 1.2 (ref 0.00–1.49)
INR: 1.6 — ABNORMAL HIGH (ref 0.00–1.49)
INR: 1.7 — ABNORMAL HIGH (ref 0.00–1.49)
Prothrombin Time: 15.7 seconds — ABNORMAL HIGH (ref 11.6–15.2)
Prothrombin Time: 19.5 seconds — ABNORMAL HIGH (ref 11.6–15.2)
Prothrombin Time: 21.1 seconds — ABNORMAL HIGH (ref 11.6–15.2)

## 2011-02-12 LAB — BASIC METABOLIC PANEL
BUN: 6 mg/dL (ref 6–23)
BUN: 7 mg/dL (ref 6–23)
BUN: 11
CO2: 25 mEq/L (ref 19–32)
CO2: 25 mEq/L (ref 19–32)
CO2: 28
Calcium: 9.2 mg/dL (ref 8.4–10.5)
Calcium: 9.2 mg/dL (ref 8.4–10.5)
Calcium: 10
Chloride: 101 mEq/L (ref 96–112)
Chloride: 98 mEq/L (ref 96–112)
Chloride: 104
Creatinine, Ser: 0.84 mg/dL (ref 0.4–1.2)
Creatinine, Ser: 0.89 mg/dL (ref 0.4–1.2)
Creatinine, Ser: 0.94
GFR calc Af Amer: >60 mL/min (ref >60)
GFR calc Af Amer: >60 mL/min (ref >60)
GFR calc Af Amer: >60
GFR calc non Af Amer: >60 mL/min (ref >60)
GFR calc non Af Amer: >60 mL/min (ref >60)
GFR calc non Af Amer: 60 — ABNORMAL LOW
Glucose, Bld: 148 mg/dL — ABNORMAL HIGH (ref 70–99)
Glucose, Bld: 156 mg/dL — ABNORMAL HIGH (ref 70–99)
Glucose, Bld: 150 — ABNORMAL HIGH
Potassium: 3.3 mEq/L — ABNORMAL LOW (ref 3.5–5.1)
Potassium: 3.8 mEq/L (ref 3.5–5.1)
Potassium: 3.4 — ABNORMAL LOW
Sodium: 132 mEq/L — ABNORMAL LOW (ref 135–145)
Sodium: 133 mEq/L — ABNORMAL LOW (ref 135–145)
Sodium: 141

## 2012-03-15 ENCOUNTER — Other Ambulatory Visit: Payer: Self-pay | Admitting: Otolaryngology

## 2012-03-15 DIAGNOSIS — R430 Anosmia: Secondary | ICD-10-CM

## 2012-03-15 DIAGNOSIS — R442 Other hallucinations: Secondary | ICD-10-CM

## 2012-03-21 ENCOUNTER — Inpatient Hospital Stay: Admission: RE | Admit: 2012-03-21 | Payer: Medicare Other | Source: Ambulatory Visit

## 2012-03-21 ENCOUNTER — Ambulatory Visit
Admission: RE | Admit: 2012-03-21 | Discharge: 2012-03-21 | Disposition: A | Discharge status: Home / Self Care | Payer: Medicare Other | Source: Ambulatory Visit | Attending: Otolaryngology | Admitting: Otolaryngology

## 2012-03-21 DIAGNOSIS — R430 Anosmia: Secondary | ICD-10-CM

## 2012-03-21 DIAGNOSIS — R442 Other hallucinations: Secondary | ICD-10-CM

## 2012-03-21 MED ORDER — GADOBENATE DIMEGLUMINE 529 MG/ML IV SOLN
20.0000 mL | Freq: Once | INTRAVENOUS | Status: AC | PRN
  Administered 2012-03-21: 20 mL via INTRAVENOUS

## 2012-04-07 ENCOUNTER — Observation Stay (HOSPITAL_COMMUNITY)
Admission: EM | Admit: 2012-04-07 | Discharge: 2012-04-08 | Disposition: A | Discharge status: Home / Self Care | Payer: Medicare Other | Attending: Emergency Medicine | Admitting: Emergency Medicine

## 2012-04-07 ENCOUNTER — Encounter (HOSPITAL_COMMUNITY): Payer: Self-pay | Admitting: Emergency Medicine

## 2012-04-07 DIAGNOSIS — Z8612 Personal history of poliomyelitis: Secondary | ICD-10-CM | POA: Insufficient documentation

## 2012-04-07 DIAGNOSIS — G43909 Migraine, unspecified, not intractable, without status migrainosus: Secondary | ICD-10-CM | POA: Insufficient documentation

## 2012-04-07 DIAGNOSIS — Z7982 Long term (current) use of aspirin: Secondary | ICD-10-CM | POA: Insufficient documentation

## 2012-04-07 DIAGNOSIS — E039 Hypothyroidism, unspecified: Secondary | ICD-10-CM | POA: Insufficient documentation

## 2012-04-07 DIAGNOSIS — I10 Essential (primary) hypertension: Secondary | ICD-10-CM | POA: Insufficient documentation

## 2012-04-07 DIAGNOSIS — G459 Transient cerebral ischemic attack, unspecified: Principal | ICD-10-CM | POA: Diagnosis present

## 2012-04-07 DIAGNOSIS — R4701 Aphasia: Secondary | ICD-10-CM | POA: Diagnosis present

## 2012-04-07 DIAGNOSIS — D649 Anemia, unspecified: Secondary | ICD-10-CM | POA: Insufficient documentation

## 2012-04-07 DIAGNOSIS — Z79899 Other long term (current) drug therapy: Secondary | ICD-10-CM | POA: Insufficient documentation

## 2012-04-07 HISTORY — DX: Essential (primary) hypertension: I10

## 2012-04-07 HISTORY — DX: Other seasonal allergic rhinitis: J30.2

## 2012-04-07 HISTORY — DX: Acute poliomyelitis, unspecified: A80.9

## 2012-04-07 HISTORY — PX: TRANSTHORACIC ECHOCARDIOGRAM: SHX275

## 2012-04-07 LAB — GLUCOSE, CAPILLARY: Glucose-Capillary: 93 mg/dL (ref 70–99)

## 2012-04-07 NOTE — ED Notes (Signed)
Pt reports she was with family and had episode around 7pm of "not being able to get words out."  Pt thinks symptoms may have lasted approx 15 minutes but she is unsure.  Reports intermittent shooting pain to L side and back of head that started after that episode.  Denies pain at present.  Reports numbness to L side of head/ear.  Grips strong and equal.  No extremity weakness. No arm drift.  Speech clear.  No facial droop.  Reports history of amnesia "many years ago."

## 2012-04-08 ENCOUNTER — Emergency Department (HOSPITAL_COMMUNITY): Payer: Medicare Other

## 2012-04-08 ENCOUNTER — Encounter (HOSPITAL_COMMUNITY): Payer: Self-pay | Admitting: Neurology

## 2012-04-08 ENCOUNTER — Observation Stay (HOSPITAL_COMMUNITY): Payer: Medicare Other

## 2012-04-08 DIAGNOSIS — R4701 Aphasia: Secondary | ICD-10-CM

## 2012-04-08 DIAGNOSIS — G459 Transient cerebral ischemic attack, unspecified: Secondary | ICD-10-CM

## 2012-04-08 LAB — DIFFERENTIAL
Basophils Absolute: 0 10*3/uL (ref 0.0–0.1)
Basophils Relative: 0 % (ref 0–1)
Eosinophils Absolute: 0.4 10*3/uL (ref 0.0–0.7)
Eosinophils Relative: 5 % (ref 0–5)
Lymphocytes Relative: 44 % (ref 12–46)
Lymphs Abs: 3.3 10*3/uL (ref 0.7–4.0)
Monocytes Absolute: 1 10*3/uL (ref 0.1–1.0)
Monocytes Relative: 14 % — ABNORMAL HIGH (ref 3–12)
Neutro Abs: 2.7 10*3/uL (ref 1.7–7.7)
Neutrophils Relative %: 37 % — ABNORMAL LOW (ref 43–77)

## 2012-04-08 LAB — RAPID URINE DRUG SCREEN, HOSP PERFORMED
Amphetamines: NOT DETECTED
Barbiturates: NOT DETECTED
Benzodiazepines: NOT DETECTED
Cocaine: NOT DETECTED
Opiates: NOT DETECTED
Tetrahydrocannabinol: NOT DETECTED

## 2012-04-08 LAB — URINALYSIS, ROUTINE W REFLEX MICROSCOPIC
Bilirubin Urine: NEGATIVE
Glucose, UA: NEGATIVE mg/dL
Hgb urine dipstick: NEGATIVE
Ketones, ur: NEGATIVE mg/dL
Leukocytes, UA: NEGATIVE
Nitrite: NEGATIVE
Protein, ur: NEGATIVE mg/dL
Specific Gravity, Urine: 1.019 (ref 1.005–1.030)
Urobilinogen, UA: 0.2 mg/dL (ref 0.0–1.0)
pH: 5.5 (ref 5.0–8.0)

## 2012-04-08 LAB — CBC
HCT: 37.7 % (ref 36.0–46.0)
Hemoglobin: 12.9 g/dL (ref 12.0–15.0)
MCH: 31.2 pg (ref 26.0–34.0)
MCHC: 34.2 g/dL (ref 30.0–36.0)
MCV: 91.1 fL (ref 78.0–100.0)
Platelets: 284 10*3/uL (ref 150–400)
RBC: 4.14 MIL/uL (ref 3.87–5.11)
RDW: 13.6 % (ref 11.5–15.5)
WBC: 7.4 10*3/uL (ref 4.0–10.5)

## 2012-04-08 LAB — PROTIME-INR
INR: 1.14 (ref 0.00–1.49)
Prothrombin Time: 14.4 seconds (ref 11.6–15.2)

## 2012-04-08 LAB — LIPID PANEL
Cholesterol: 114 mg/dL (ref 0–200)
Cholesterol: 112 mg/dL (ref 0–200)
HDL: 30 mg/dL — ABNORMAL LOW (ref >39)
HDL: 32 mg/dL — ABNORMAL LOW (ref >39)
LDL Cholesterol: 42 mg/dL (ref 0–99)
LDL Cholesterol: 47 mg/dL (ref 0–99)
Total CHOL/HDL Ratio: 3.8 RATIO
Total CHOL/HDL Ratio: 3.5 RATIO
Triglycerides: 208 mg/dL — ABNORMAL HIGH (ref <150)
Triglycerides: 165 mg/dL — ABNORMAL HIGH (ref <150)
VLDL: 42 mg/dL — ABNORMAL HIGH (ref 0–40)
VLDL: 33 mg/dL (ref 0–40)

## 2012-04-08 LAB — COMPREHENSIVE METABOLIC PANEL
ALT: 41 U/L — ABNORMAL HIGH (ref 0–35)
AST: 28 U/L (ref 0–37)
Albumin: 3.8 g/dL (ref 3.5–5.2)
Alkaline Phosphatase: 85 U/L (ref 39–117)
BUN: 19 mg/dL (ref 6–23)
CO2: 27 mEq/L (ref 19–32)
Calcium: 10.9 mg/dL — ABNORMAL HIGH (ref 8.4–10.5)
Chloride: 107 mEq/L (ref 96–112)
Creatinine, Ser: 0.79 mg/dL (ref 0.50–1.10)
GFR calc Af Amer: >90 mL/min (ref >90)
GFR calc non Af Amer: 82 mL/min — ABNORMAL LOW (ref >90)
Glucose, Bld: 101 mg/dL — ABNORMAL HIGH (ref 70–99)
Potassium: 3.8 mEq/L (ref 3.5–5.1)
Sodium: 143 mEq/L (ref 135–145)
Total Bilirubin: 0.4 mg/dL (ref 0.3–1.2)
Total Protein: 7 g/dL (ref 6.0–8.3)

## 2012-04-08 LAB — POCT I-STAT TROPONIN I: Troponin i, poc: 0 ng/mL (ref 0.00–0.08)

## 2012-04-08 LAB — POCT I-STAT, CHEM 8
BUN: 19 mg/dL (ref 6–23)
Calcium, Ion: 1.41 mmol/L — ABNORMAL HIGH (ref 1.13–1.30)
Chloride: 107 mEq/L (ref 96–112)
Creatinine, Ser: 0.9 mg/dL (ref 0.50–1.10)
Glucose, Bld: 97 mg/dL (ref 70–99)
HCT: 39 % (ref 36.0–46.0)
Hemoglobin: 13.3 g/dL (ref 12.0–15.0)
Potassium: 3.7 mEq/L (ref 3.5–5.1)
Sodium: 144 mEq/L (ref 135–145)
TCO2: 25 mmol/L (ref 0–100)

## 2012-04-08 LAB — HEMOGLOBIN A1C
Hgb A1c MFr Bld: 5.7 % — ABNORMAL HIGH (ref <5.7)
Mean Plasma Glucose: 117 mg/dL — ABNORMAL HIGH (ref <117)

## 2012-04-08 LAB — TROPONIN I: Troponin I: <0.3 ng/mL (ref <0.30)

## 2012-04-08 LAB — APTT: aPTT: 32 seconds (ref 24–37)

## 2012-04-08 MED ORDER — LEVOTHYROXINE SODIUM 175 MCG PO TABS
175.0000 ug | ORAL_TABLET | ORAL | Status: DC
  Administered 2012-04-08: 175 ug via ORAL
  Filled 2012-04-08: qty 1

## 2012-04-08 MED ORDER — PANTOPRAZOLE SODIUM 40 MG PO TBEC
40.0000 mg | DELAYED_RELEASE_TABLET | Freq: Every day | ORAL | Status: DC
  Administered 2012-04-08: 40 mg via ORAL
  Filled 2012-04-08 (×2): qty 1

## 2012-04-08 MED ORDER — GADOBENATE DIMEGLUMINE 529 MG/ML IV SOLN
20.0000 mL | Freq: Once | INTRAVENOUS | Status: AC | PRN
  Administered 2012-04-08: 20 mL via INTRAVENOUS

## 2012-04-08 MED ORDER — ASPIRIN 325 MG PO TABS
325.0000 mg | ORAL_TABLET | Freq: Every day | ORAL | Status: DC
  Administered 2012-04-08: 325 mg via ORAL
  Filled 2012-04-08: qty 1

## 2012-04-08 MED ORDER — ASPIRIN 325 MG PO TABS: 325.0000 mg | ORAL_TABLET | Freq: Every day | ORAL | Status: DC

## 2012-04-08 MED ORDER — LOSARTAN POTASSIUM 50 MG PO TABS
100.0000 mg | ORAL_TABLET | Freq: Every day | ORAL | Status: DC
  Administered 2012-04-08: 100 mg via ORAL
  Filled 2012-04-08: qty 2

## 2012-04-08 NOTE — Procedures (Signed)
History: 71 yo F with 5 minute episode of confusion  Sedation: none  Background: There is a well defined posterior dominant rhythm of 9 Hz that attenuates with eye opening. There are some rhythmic temporal theta bursts of drowsiness(RTTBD, normal variant)  Photic stimulation: Physiologic driving is present  EEG Diagnosis: 1) Normal EEG  Clinical Interpretation: This normal EEG is recorded in the waking and drowsy state. There was no seizure or seizure predisposition recorded on this study.   Ritta Slot, MD [facility] [phone]  If 7pm- 7am, please page neurology on call at [phone].

## 2012-04-08 NOTE — ED Notes (Signed)
EKG given to Dr. Lars Mage

## 2012-04-08 NOTE — Progress Notes (Signed)
VASCULAR LAB PRELIMINARY  PRELIMINARY  PRELIMINARY  PRELIMINARY  Carotid Dopplers completed.    Preliminary report:  There is no ICA stenosis.  Right vertebral artery flow not insonated.  Left vertebral artery flow is antegrade.  Karen Potter, 04/08/2012, 8:55 AM

## 2012-04-08 NOTE — Consult Note (Signed)
Reason for Consult: TIA Referring Physician: ER  Primary MD: Dr. Leodis Sias  Karen Potter is an 71 y.o. female.  HPI:   Karen Potter is a 71 year old right-handed white female with a history of migraine headaches as a child, and a history of polio affecting mainly the left leg. The patient has been seen in the past for transient episodes of confusion or amnesia. The patient had an event tonight last about 5 minutes that began around 7 PM. The patient had onset of symptoms mid-sentence, and she began to feel confused, and was unable to speak clearly. The patient recalls events during this episode. The patient does not recall any visual change, or focal numbness or weakness on the face, arms, or legs. The patient did have a sharp jabbing headache pain on the left side the head that persisted after the event was over. This also was associated with a tingling or numbing sensation on the left. The patient this point feels "wiped out", but otherwise she feels normal. The patient initially did not wish to go to the emergency room, but eventually she decided to get an evaluation. The patient had a MRI the brain within the last 2 weeks for an evaluation of anosmia, and the study was unremarkable. The patient has not had a recent vascular workup. The patient is on aspirin, but she may have forgotten to take her dose last evening. The patient is not a TPA candidate secondary to minimal deficit. NIH stroke scale score is 0.  Past Medical History  Diagnosis Date  . Amnesia   . Hypothyroid   . Hypertension   . Seasonal allergies   . Polio   . Migraine     Past Surgical History  Procedure Date  . Abdominal hysterectomy   . Cholecystectomy   . Carpal tunnel release   . Back surgery   . Knee surgery     right    Family History  Problem Relation Age of Onset  . Heart failure Mother   . Cancer Father   . Cancer Brother     Social History:  reports that she has never smoked. She does not have any  smokeless tobacco history on file. She reports that she does not drink alcohol or use illicit drugs.  Allergies:  Allergies  Allergen Reactions  . Codeine Itching  . Other Itching    Most pain meds cause itching.  When has to take pain medication, she has been instructed to take Benadryl  . Doxycycline Rash    Medications: Prior to Admission:  (Not in a hospital admission) Scheduled:   Continuous:   PRN:    Results for orders placed during the hospital encounter of 04/07/12 (from the past 48 hour(s))  GLUCOSE, CAPILLARY     Status: Normal   Collection Time   04/08/12 12:00 AM      Component Value Range Comment   Glucose-Capillary 93  70 - 99 mg/dL    Comment 1 Notify RN     PROTIME-INR     Status: Normal   Collection Time   04/08/12 12:03 AM      Component Value Range Comment   Prothrombin Time 14.4  11.6 - 15.2 seconds    INR 1.14  0.00 - 1.49   APTT     Status: Normal   Collection Time   04/08/12 12:03 AM      Component Value Range Comment   aPTT 32  24 - 37 seconds   CBC  Status: Normal   Collection Time   04/08/12 12:03 AM      Component Value Range Comment   WBC 7.4  4.0 - 10.5 K/uL    RBC 4.14  3.87 - 5.11 MIL/uL    Hemoglobin 12.9  12.0 - 15.0 g/dL    HCT 16.1  09.6 - 04.5 %    MCV 91.1  78.0 - 100.0 fL    MCH 31.2  26.0 - 34.0 pg    MCHC 34.2  30.0 - 36.0 g/dL    RDW 40.9  81.1 - 91.4 %    Platelets 284  150 - 400 K/uL   DIFFERENTIAL     Status: Abnormal   Collection Time   04/08/12 12:03 AM      Component Value Range Comment   Neutrophils Relative 37 (*) 43 - 77 %    Neutro Abs 2.7  1.7 - 7.7 K/uL    Lymphocytes Relative 44  12 - 46 %    Lymphs Abs 3.3  0.7 - 4.0 K/uL    Monocytes Relative 14 (*) 3 - 12 %    Monocytes Absolute 1.0  0.1 - 1.0 K/uL    Eosinophils Relative 5  0 - 5 %    Eosinophils Absolute 0.4  0.0 - 0.7 K/uL    Basophils Relative 0  0 - 1 %    Basophils Absolute 0.0  0.0 - 0.1 K/uL   COMPREHENSIVE METABOLIC PANEL     Status:  Abnormal   Collection Time   04/08/12 12:03 AM      Component Value Range Comment   Sodium 143  135 - 145 mEq/L    Potassium 3.8  3.5 - 5.1 mEq/L    Chloride 107  96 - 112 mEq/L    CO2 27  19 - 32 mEq/L    Glucose, Bld 101 (*) 70 - 99 mg/dL    BUN 19  6 - 23 mg/dL    Creatinine, Ser 7.82  0.50 - 1.10 mg/dL    Calcium 95.6 (*) 8.4 - 10.5 mg/dL    Total Protein 7.0  6.0 - 8.3 g/dL    Albumin 3.8  3.5 - 5.2 g/dL    AST 28  0 - 37 U/L    ALT 41 (*) 0 - 35 U/L    Alkaline Phosphatase 85  39 - 117 U/L    Total Bilirubin 0.4  0.3 - 1.2 mg/dL    GFR calc non Af Amer 82 (*) >90 mL/min    GFR calc Af Amer >90  >90 mL/min   TROPONIN I     Status: Normal   Collection Time   04/08/12 12:04 AM      Component Value Range Comment   Troponin I <0.30  <0.30 ng/mL   POCT I-STAT TROPONIN I     Status: Normal   Collection Time   04/08/12 12:16 AM      Component Value Range Comment   Troponin i, poc 0.00  0.00 - 0.08 ng/mL    Comment 3            POCT I-STAT, CHEM 8     Status: Abnormal   Collection Time   04/08/12 12:18 AM      Component Value Range Comment   Sodium 144  135 - 145 mEq/L    Potassium 3.7  3.5 - 5.1 mEq/L    Chloride 107  96 - 112 mEq/L    BUN 19  6 -  23 mg/dL    Creatinine, Ser 1.61  0.50 - 1.10 mg/dL    Glucose, Bld 97  70 - 99 mg/dL    Calcium, Ion 0.96 (*) 1.13 - 1.30 mmol/L    TCO2 25  0 - 100 mmol/L    Hemoglobin 13.3  12.0 - 15.0 g/dL    HCT 04.5  40.9 - 81.1 %   URINALYSIS, ROUTINE W REFLEX MICROSCOPIC     Status: Normal   Collection Time   04/08/12 12:45 AM      Component Value Range Comment   Color, Urine YELLOW  YELLOW    APPearance CLEAR  CLEAR    Specific Gravity, Urine 1.019  1.005 - 1.030    pH 5.5  5.0 - 8.0    Glucose, UA NEGATIVE  NEGATIVE mg/dL    Hgb urine dipstick NEGATIVE  NEGATIVE    Bilirubin Urine NEGATIVE  NEGATIVE    Ketones, ur NEGATIVE  NEGATIVE mg/dL    Protein, ur NEGATIVE  NEGATIVE mg/dL    Urobilinogen, UA 0.2  0.0 - 1.0 mg/dL     Nitrite NEGATIVE  NEGATIVE    Leukocytes, UA NEGATIVE  NEGATIVE MICROSCOPIC NOT DONE ON URINES WITH NEGATIVE PROTEIN, BLOOD, LEUKOCYTES, NITRITE, OR GLUCOSE <1000 mg/dL.    Ct Head Wo Contrast  04/08/2012  *RADIOLOGY REPORT*  Clinical Data: Left-sided posterior headache and difficulty with speech.  CT HEAD WITHOUT CONTRAST  Technique:  Contiguous axial images were obtained from the base of the skull through the vertex without contrast.  Comparison: MRI of the brain dated 03/21/2012  Findings: The brain demonstrates no evidence of hemorrhage, infarction, edema, mass effect, extra-axial fluid collection, hydrocephalus or mass lesion.  The skull is unremarkable.  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  IMPRESSION: Normal head CT.   Original Report Authenticated By: Irish Lack, M.D.     @ROS @  The patient reports a slight left-sided headache.  The patient denies shortness of breath, chest pain.  The patient denies abdominal pain, nausea or vomiting.  The patient does report a feeling of generalized fatigue or weakness.  The patient denies any dizziness, loss of consciousness.  The patient denies any focal numbness or weakness. The patient denies visual changes.  The patient denies problems controlling the bowels or the bladder.   Blood pressure 141/81, pulse 70, temperature 98.6 F (37 C), temperature source Oral, resp. rate 18, SpO2 96.00%.  Physical Examination:  General:  The patient is alert and cooperative at the time of the examination. The patient is oriented x3.  Respiratory:  Lungs fields are clear to auscultation bilaterally.  Cardiovascular:  Examination reveals a regular rate and rhythm, no obvious murmurs or rubs are noted.  Abdominal Exam:  Abdomen is soft and nontender, positive bowel sounds are noted. No organomegaly is noted.  Extremities:  Extremities are without significant edema.    Neurologic Examination  Cranial Nerves:   Facial symmetry is present. Pupils are equal, round, and reactive to light. Visual fields are full. Speech is normal, no aphasia or dysarthria is noted. Pin prick sensation on the face is symmetric and normal.  Motor Examination: Motor testing of all 4 extremities reveals normal strength of the arms and the legs. Atrophy of the left calf is seen relative to the right.  Sensory Examination: Sensory examination of the arms and legs shows normal pinprick, soft touch, and vibration sensation throughout.  Cerebellar Examination: The patient has good finger-nose-finger and heel-to-shin bilaterally. Gait was not tested.  Deep Tendon Reflexes: Deep tendon reflexes are symmetric and normal throughout. Toes are downgoing bilaterally.  Interval: Baseline (11/30 0121) Level of Consciousness (1a. ): Alert, keenly responsive (11/30 0121) LOC Questions (1b. ): Answers both questions correctly (11/30 0121) LOC Commands (1c. ): Performs both tasks correctly (11/30 0121) Best Gaze (2. ): Normal (11/30 0121) Visual (3. ): No visual loss (11/30 0121) Facial Palsy (4. ): Normal symmetrical movements (11/30 0121) Motor Arm, Left (5a. ): No drift (11/30 0121) Motor Arm, Right (5b. ): No drift (11/30 0121) Motor Leg, Left (6a. ): No drift (11/30 0121) Motor Leg, Right (6b. ): No drift (11/30 0121) Limb Ataxia (7. ): Absent (11/30 0121) Sensory (8. ): Normal, no sensory loss (11/30 0121) Best Language (9. ): No aphasia (11/30 0121) Dysarthria (10. ): Normal (11/30 0121) Inattention/Extinction: No Abnormality (11/30 0121) Total: 0  (11/30 0121)     Assessment/Plan:  1. Transient confusion, possibly aphasia  2. History of migraine  3. History of amnesia in the past  The patient has had at least 2 events in the past of amnesia, with the last event occurring 15 years ago. The patient has had a brief episode of confusion that occurred rapidly, and lasted only about 5 minutes. This episode certainly  could have represented a TIA event. The patient will require a full vascular workup. The patient will undergo an EEG study looking for possible seizures, and the possibility of a migraine event does need to be considered.  -MRI the brain -MRA of the head and neck -Carotid Doppler study -2-D echocardiogram -EEG study -Aspirin therapy -Fasting lipid profile -Stroke team will follow    Tamea Bai KEITH 04/08/2012, 1:43 AM

## 2012-04-08 NOTE — ED Notes (Signed)
Pt transported to MRI and will then be taken for EEG.

## 2012-04-08 NOTE — Progress Notes (Signed)
EEG completed at bedside 

## 2012-04-08 NOTE — ED Provider Notes (Signed)
0900 patient here from [facility] where she has been on TIA protocol through the night.  Patient had a 5 minute episode of confusion and expressive aphasia yesterday around 730 pm.  PEARL, FOV normal, MAE=, RLE larger than LLE because of polio, pmh difficulty ambulating no change in her ambulation today.  pmh of amnesia. Sensation in tact except for R thigh with pmh of numbness from back surgery 11 years ago.  MRI , echocardiogram results pending .  EEG pending in 1 hour. VSS O2 sat 97% HR 73 NSR.  Diet ordered. Patient states that she is under a lot of stress.   11am  MRI/MRA of the neck and brian show no acute process.  Stable mucous retention cyst in the lft sphenoid sinus as on last MRI.  Patient will be discharged and follow up with pcp next week.  Follow up with Dr. Ranyia Witting Hahn for EEG results.  No complaints.  Patient is ready for discharge.      Remi Haggard, NP 04/09/12 [phone]

## 2012-04-08 NOTE — ED Provider Notes (Addendum)
History     CSN: [id number]  Arrival date & time 04/07/12  2330   First MD Initiated Contact with Patient 04/07/12 2337      Chief Complaint  Patient presents with  . Stroke Symptoms    (Consider location/radiation/quality/duration/timing/severity/associated sxs/prior treatment) HPI  Patient presents to the emergency department with her husband. She states "I'm having trouble with my mind" she states this evening about 7 PM she was talking to her children and states "I couldn't make sense of anything and I was talking in circles". She states she didn't have difficulty speaking but she just could not think of what she wanted to say. She states she feels better now. She denies numbness or tingling in her extremities or in her face. She states since this started she is having some left-sided sharp shooting stabbing head pains on the left side that are not constant. She denies any visual changes. She denies nausea, vomiting, chest pain, shortness of breath. She states she had something similar in the past about 10 years ago and was diagnosed with amnesia. She states she saw Dr. Anne Hahn for this and then has seen him since then for other problems. Husband cannot give a clear description of what happened when she had the amnesia. Patient's husband also does not seem to know anything about what happened tonight. He states she seems her usual self to him right now. He also reports they have been under a lot of stress this week indicating that their car transmission when out, he states part of the house has no electricity and the electrician cannot figure out why, he also states his  grandchildren and great-grandchildren came over to stay for Thanksgiving and there were hot wires exposed in the walls and he was concerned they would get shocked.    PCP Dr Rudi Heap  Past Medical History  Diagnosis Date  . Amnesia   . Hypothyroid   . Hypertension   . Seasonal allergies   . Polio   . Migraine     "amnesia" about 10 years ago  Past Surgical History  Procedure Date  . Abdominal hysterectomy   . Cholecystectomy   . Carpal tunnel release   . Back surgery   . Knee surgery     right    Family History  Problem Relation Age of Onset  . Heart failure Mother   . Cancer Father   . Cancer Brother     History  Substance Use Topics  . Smoking status: Never Smoker   . Smokeless tobacco: Not on file  . Alcohol Use: No  Lives at home Lives with spouse  OB History    Grav Para Term Preterm Abortions TAB SAB Ect Mult Living                  Review of Systems  All other systems reviewed and are negative.    Allergies  Codeine; Other; and Doxycycline  Home Medications   Current Outpatient Rx  Name  Route  Sig  Dispense  Refill  . ASPIRIN 325 MG PO TABS   Oral   Take 325 mg by mouth daily.         Marland Kitchen DHA COMPLETE PO   Oral   Take 1 tablet by mouth daily.         Marland Kitchen FEXOFENADINE HCL 180 MG PO TABS   Oral   Take 180 mg by mouth daily as needed. For allergies         .  LEVOTHYROXINE SODIUM 150 MCG PO TABS   Oral   Take 150 mcg by mouth every other day.         Marland Kitchen LEVOTHYROXINE SODIUM 175 MCG PO TABS   Oral   Take 175 mcg by mouth every other day.         . ADULT MULTIVITAMIN W/MINERALS CH   Oral   Take 1 tablet by mouth daily. Women's One A Day         . OMEPRAZOLE 20 MG PO CPDR   Oral   Take 20 mg by mouth daily.           BP 164/84  Pulse 73  Temp 98.6 F (37 C) (Oral)  Resp 18  SpO2 96%  Vital signs normal except hypertension   Physical Exam  Nursing note and vitals reviewed. Constitutional: She is oriented to person, place, and time. She appears well-developed and well-nourished.  Non-toxic appearance. She does not appear ill. No distress.  HENT:  Head: Normocephalic and atraumatic.  Right Ear: External ear normal.  Left Ear: External ear normal.  Nose: Nose normal. No mucosal edema or rhinorrhea.  Mouth/Throat: Oropharynx  is clear and moist and mucous membranes are normal. No dental abscesses or uvula swelling.  Eyes: Conjunctivae normal and EOM are normal. Pupils are equal, round, and reactive to light.  Neck: Normal range of motion and full passive range of motion without pain. Neck supple.  Cardiovascular: Normal rate, regular rhythm and normal heart sounds.  Exam reveals no gallop and no friction rub.   No murmur heard. Pulmonary/Chest: Effort normal and breath sounds normal. No respiratory distress. She has no wheezes. She has no rhonchi. She has no rales. She exhibits no tenderness and no crepitus.  Abdominal: Soft. Normal appearance and bowel sounds are normal. She exhibits no distension. There is no tenderness. There is no rebound and no guarding.  Musculoskeletal: Normal range of motion. She exhibits no edema and no tenderness.       Moves all extremities well.   Neurological: She is alert and oriented to person, place, and time. She has normal strength. No cranial nerve deficit.       Grips are equal bilaterally, no pronator drift present, no focal motor weakness noted, no sensory loss noted, patient is alert and oriented and her speech appears to be normal. Her husband states she seems her usual self to him.  Skin: Skin is warm and intact. No rash noted. She is not diaphoretic. No erythema. No pallor.  Psychiatric: She has a normal mood and affect. Her speech is normal and behavior is normal. Her mood appears not anxious.    ED Course  Procedures (including critical care time)  01:00 pt states she feels back to normal, still getting few sharp jabs in her left head.  01:08 Dr Anne Hahn, will come see patient.   01:35 Dr Anne Hahn feels she should stay and get further evaluation.   01:39 Orders placed for TIA protocol in CDU.   07:19 Remi Haggard, PA and Dr Preston Fleeting given report.  Results for orders placed during the hospital encounter of 04/07/12  GLUCOSE, CAPILLARY      Component Value Range    Glucose-Capillary 93  70 - 99 mg/dL   Comment 1 Notify RN    PROTIME-INR      Component Value Range   Prothrombin Time 14.4  11.6 - 15.2 seconds   INR 1.14  0.00 - 1.49  APTT  Component Value Range   aPTT 32  24 - 37 seconds  CBC      Component Value Range   WBC 7.4  4.0 - 10.5 K/uL   RBC 4.14  3.87 - 5.11 MIL/uL   Hemoglobin 12.9  12.0 - 15.0 g/dL   HCT 21.3  08.6 - 57.8 %   MCV 91.1  78.0 - 100.0 fL   MCH 31.2  26.0 - 34.0 pg   MCHC 34.2  30.0 - 36.0 g/dL   RDW 46.9  62.9 - 52.8 %   Platelets 284  150 - 400 K/uL  DIFFERENTIAL      Component Value Range   Neutrophils Relative 37 (*) 43 - 77 %   Neutro Abs 2.7  1.7 - 7.7 K/uL   Lymphocytes Relative 44  12 - 46 %   Lymphs Abs 3.3  0.7 - 4.0 K/uL   Monocytes Relative 14 (*) 3 - 12 %   Monocytes Absolute 1.0  0.1 - 1.0 K/uL   Eosinophils Relative 5  0 - 5 %   Eosinophils Absolute 0.4  0.0 - 0.7 K/uL   Basophils Relative 0  0 - 1 %   Basophils Absolute 0.0  0.0 - 0.1 K/uL  COMPREHENSIVE METABOLIC PANEL      Component Value Range   Sodium 143  135 - 145 mEq/L   Potassium 3.8  3.5 - 5.1 mEq/L   Chloride 107  96 - 112 mEq/L   CO2 27  19 - 32 mEq/L   Glucose, Bld 101 (*) 70 - 99 mg/dL   BUN 19  6 - 23 mg/dL   Creatinine, Ser 4.13  0.50 - 1.10 mg/dL   Calcium 24.4 (*) 8.4 - 10.5 mg/dL   Total Protein 7.0  6.0 - 8.3 g/dL   Albumin 3.8  3.5 - 5.2 g/dL   AST 28  0 - 37 U/L   ALT 41 (*) 0 - 35 U/L   Alkaline Phosphatase 85  39 - 117 U/L   Total Bilirubin 0.4  0.3 - 1.2 mg/dL   GFR calc non Af Amer 82 (*) >90 mL/min   GFR calc Af Amer >90  >90 mL/min  TROPONIN I      Component Value Range   Troponin I <0.30  <0.30 ng/mL  URINE RAPID DRUG SCREEN (HOSP PERFORMED)      Component Value Range   Opiates NONE DETECTED  NONE DETECTED   Cocaine NONE DETECTED  NONE DETECTED   Benzodiazepines NONE DETECTED  NONE DETECTED   Amphetamines NONE DETECTED  NONE DETECTED   Tetrahydrocannabinol NONE DETECTED  NONE DETECTED    Barbiturates NONE DETECTED  NONE DETECTED  URINALYSIS, ROUTINE W REFLEX MICROSCOPIC      Component Value Range   Color, Urine YELLOW  YELLOW   APPearance CLEAR  CLEAR   Specific Gravity, Urine 1.019  1.005 - 1.030   pH 5.5  5.0 - 8.0   Glucose, UA NEGATIVE  NEGATIVE mg/dL   Hgb urine dipstick NEGATIVE  NEGATIVE   Bilirubin Urine NEGATIVE  NEGATIVE   Ketones, ur NEGATIVE  NEGATIVE mg/dL   Protein, ur NEGATIVE  NEGATIVE mg/dL   Urobilinogen, UA 0.2  0.0 - 1.0 mg/dL   Nitrite NEGATIVE  NEGATIVE   Leukocytes, UA NEGATIVE  NEGATIVE  POCT I-STAT, CHEM 8      Component Value Range   Sodium 144  135 - 145 mEq/L   Potassium 3.7  3.5 - 5.1 mEq/L  Chloride 107  96 - 112 mEq/L   BUN 19  6 - 23 mg/dL   Creatinine, Ser 3.24  0.50 - 1.10 mg/dL   Glucose, Bld 97  70 - 99 mg/dL   Calcium, Ion 4.01 (*) 1.13 - 1.30 mmol/L   TCO2 25  0 - 100 mmol/L   Hemoglobin 13.3  12.0 - 15.0 g/dL   HCT 02.7  25.3 - 66.4 %  POCT I-STAT TROPONIN I      Component Value Range   Troponin i, poc 0.00  0.00 - 0.08 ng/mL   Comment 3           LIPID PANEL      Component Value Range   Cholesterol 114  0 - 200 mg/dL   Triglycerides 403 (*) <150 mg/dL   HDL 30 (*) >47 mg/dL   Total CHOL/HDL Ratio 3.8     VLDL 42 (*) 0 - 40 mg/dL   LDL Cholesterol 42  0 - 99 mg/dL  LIPID PANEL      Component Value Range   Cholesterol 112  0 - 200 mg/dL   Triglycerides 425 (*) <150 mg/dL   HDL 32 (*) >95 mg/dL   Total CHOL/HDL Ratio 3.5     VLDL 33  0 - 40 mg/dL   LDL Cholesterol 47  0 - 99 mg/dL   Laboratory interpretation all normal except mildly elevated calcium   Ct Head Wo Contrast  04/08/2012  *RADIOLOGY REPORT*  Clinical Data: Left-sided posterior headache and difficulty with speech.  CT HEAD WITHOUT CONTRAST  Technique:  Contiguous axial images were obtained from the base of the skull through the vertex without contrast.  Comparison: MRI of the brain dated 03/21/2012  Findings: The brain demonstrates no evidence of  hemorrhage, infarction, edema, mass effect, extra-axial fluid collection, hydrocephalus or mass lesion.  The skull is unremarkable.  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  IMPRESSION: Normal head CT.   Original Report Authenticated By: Irish Lack, M.D.     Mr Laqueta Jean GL Contrast  03/21/2012  *RADIOLOGY REPORT*  Clinical Data: Anosmia.  Loss of taste  MRI HEAD WITHOUT AND WITH CONTRAST  Technique:  Multiplanar, multiecho pulse sequences of the brain and surrounding structures were obtained according to standard protocol without and with intravenous contrast  Contrast: 20mL MULTIHANCE GADOBENATE DIMEGLUMINE 529 MG/ML IV SOLN  Comparison: None.  Findings: Ventricle size is normal.  Negative for acute or chronic ischemia.  Cerebral white matter is normal.  Brainstem and cerebellum are normal.  Negative for intracranial hemorrhage or mass.  No edema is present.  No enhancing lesions are seen following contrast administration. Normal enhancement is noted.  No evidence of mass along the olfactory nerve.  The ethmoid sinuses show mild mucosal edema but no mass lesion.  Polypoid lesion in the left sphenoid sinus measures 14 x 17 mm. This has homogeneous high signal on T1 and low signal on T2.  This is most likely a mucous retention cyst containing proteinaceous secretions.  Fat could also have this appearance but would be unusual in the sinus.  IMPRESSION: No significant intracranial abnormality.  Polypoid lesion in the left sphenoid sinus most likely a mucous retention cyst with proteinaceous secretions.   Original Report Authenticated By: Janeece Riggers, M.D.      Date: 04/08/2012  Rate: 73  Rhythm: normal sinus rhythm  QRS Axis: normal  Intervals: normal  ST/T Wave abnormalities: normal  Conduction Disutrbances:right bundle branch block and left  anterior fascicular block  Narrative Interpretation:   Old EKG Reviewed: unchanged from4/20/2012      1. TIA (transient ischemic attack)      Disposition will be determined by her tests results later today in CDU.  Devoria Albe, MD, FACEP   MDM          Ward Givens, MD 04/08/12 [id number]  Ward Givens, MD 04/08/12 [phone]

## 2012-04-08 NOTE — Progress Notes (Signed)
  Echocardiogram 2D Echocardiogram has been performed.  Karen Potter 04/08/2012, 8:53 AM

## 2012-04-08 NOTE — Progress Notes (Signed)
RT Note: placed pt on Auto CPAP with full face mask. Pt tolerating well, RT and RN will continue monitor.

## 2012-04-08 NOTE — ED Notes (Signed)
Pt's husband st's pt wears cpap at night.  Respiratory notified, they're bringing her a cpap machine.

## 2012-04-11 DIAGNOSIS — R4701 Aphasia: Secondary | ICD-10-CM | POA: Diagnosis present

## 2012-04-11 DIAGNOSIS — G459 Transient cerebral ischemic attack, unspecified: Secondary | ICD-10-CM | POA: Diagnosis present

## 2012-06-12 ENCOUNTER — Other Ambulatory Visit: Payer: Self-pay | Admitting: Gastroenterology

## 2012-07-31 ENCOUNTER — Other Ambulatory Visit: Payer: Self-pay | Admitting: Family Medicine

## 2012-10-03 ENCOUNTER — Ambulatory Visit (INDEPENDENT_AMBULATORY_CARE_PROVIDER_SITE_OTHER): Payer: Medicare Other | Admitting: Family Medicine

## 2012-10-03 ENCOUNTER — Encounter: Payer: Self-pay | Admitting: Family Medicine

## 2012-10-03 VITALS — BP 133/84 | HR 78 | Temp 97.9°F | Wt 238.8 lb

## 2012-10-03 DIAGNOSIS — R739 Hyperglycemia, unspecified: Secondary | ICD-10-CM

## 2012-10-03 DIAGNOSIS — E039 Hypothyroidism, unspecified: Secondary | ICD-10-CM

## 2012-10-03 DIAGNOSIS — I10 Essential (primary) hypertension: Secondary | ICD-10-CM

## 2012-10-03 DIAGNOSIS — E669 Obesity, unspecified: Secondary | ICD-10-CM

## 2012-10-03 DIAGNOSIS — E559 Vitamin D deficiency, unspecified: Secondary | ICD-10-CM

## 2012-10-03 DIAGNOSIS — R7309 Other abnormal glucose: Secondary | ICD-10-CM

## 2012-10-03 DIAGNOSIS — E785 Hyperlipidemia, unspecified: Secondary | ICD-10-CM

## 2012-10-03 LAB — COMPLETE METABOLIC PANEL WITH GFR
ALT: 49 U/L — ABNORMAL HIGH (ref 0–35)
AST: 33 U/L (ref 0–37)
Albumin: 4.1 g/dL (ref 3.5–5.2)
Alkaline Phosphatase: 98 U/L (ref 39–117)
BUN: 15 mg/dL (ref 6–23)
CO2: 27 mEq/L (ref 19–32)
Calcium: 10.6 mg/dL — ABNORMAL HIGH (ref 8.4–10.5)
Chloride: 105 mEq/L (ref 96–112)
Creat: 0.73 mg/dL (ref 0.50–1.10)
GFR, Est African American: >89 mL/min
GFR, Est Non African American: 84 mL/min
Glucose, Bld: 112 mg/dL — ABNORMAL HIGH (ref 70–99)
Potassium: 4.5 mEq/L (ref 3.5–5.3)
Sodium: 141 mEq/L (ref 135–145)
Total Bilirubin: 0.6 mg/dL (ref 0.3–1.2)
Total Protein: 7 g/dL (ref 6.0–8.3)

## 2012-10-03 LAB — POCT GLYCOSYLATED HEMOGLOBIN (HGB A1C): Hemoglobin A1C: 6.2

## 2012-10-03 LAB — TSH: TSH: 1.515 u[IU]/mL (ref 0.350–4.500)

## 2012-10-03 NOTE — Progress Notes (Signed)
Patient ID: Karen Potter, female   DOB: 05/08/1941, 72 y.o.   MRN: [id number] SUBJECTIVE: HPI: 3 months follow up: Major problem is allergies. Memory same , saw Dr Anne Hahn.in December.no more spells since the last visit. H/o TIA.  Patient is here for follow up of hyperlipidemia/obesity/family stresses/hypertension:  denies Headache;denies Chest Pain;denies weakness;denies Shortness of Breath and orthopnea;denies Visual changes;denies palpitations;denies cough;denies pedal edema;denies symptoms of TIA or stroke;deniesClaudication symptoms. admits to Compliance with medications; denies Problems with medications.  Has book on Eat to Live, not read it. Has other books on Alzheimer's disease  PMH/PSH: reviewed/updated in Epic  SH/FH: reviewed/updated in Epic  Allergies: reviewed/updated in Epic  Medications: reviewed/updated in Epic  Immunizations: reviewed/updated in Epic  ROS: As above in the HPI. All other systems are stable or negative.  OBJECTIVE: APPEARANCE:  Patient in no acute distress.The patient appeared well nourished and normally developed. Acyanotic. Waist: VITAL SIGNS:BP 133/84  Pulse 78  Temp(Src) 97.9 F (36.6 C) (Oral)  Wt 238 lb 12.8 oz (108.319 kg)   SKIN: warm and  Dry without overt rashes, tattoos and scars  HEAD and Neck: without JVD, Head and scalp: normal Eyes:No scleral icterus. Fundi normal, eye movements normal. Ears: Auricle normal, canal normal, Tympanic membranes normal, insufflation normal. Nose: normal Throat: normal Neck & thyroid: normal  CHEST & LUNGS: Chest wall: normal Lungs: Clear  CVS: Reveals the PMI to be normally located. Regular rhythm, First and Second Heart sounds are normal,  absence of murmurs, rubs or gallops. Peripheral vasculature: Radial pulses: normal Dorsal pedis pulses: normal Posterior pulses: normal  ABDOMEN:  Appearance: normal Benign,, no organomegaly, no masses, no Abdominal Aortic enlargement. No  Guarding , no rebound. No Bruits. Bowel sounds: normal  RECTAL: N/A GU: N/A  EXTREMETIES: nonedematous. Both Femoral and Pedal pulses are normal.  MUSCULOSKELETAL:  Spine: normal Joints: intact  NEUROLOGIC: oriented to time,place and person; nonfocal. Strength is normal Sensory is normal Reflexes are normal Cranial Nerves are normal.  ASSESSMENT: HTN (hypertension) - Plan: COMPLETE METABOLIC PANEL WITH GFR  HLD (hyperlipidemia) - Plan: COMPLETE METABOLIC PANEL WITH GFR, NMR Lipoprofile with Lipids  Obesity, unspecified  Vitamin D deficiency - Plan: Vitamin D 25 hydroxy  Unspecified hypothyroidism - Plan: TSH  Hyperglycemia - Plan: POCT glycosylated hemoglobin (Hb A1C)  PLAN: Orders Placed This Encounter  Procedures  . COMPLETE METABOLIC PANEL WITH GFR  . NMR Lipoprofile with Lipids  . TSH  . Vitamin D 25 hydroxy  . POCT glycosylated hemoglobin (Hb A1C)        Dr Woodroe Mode Recommendations  Diet and Exercise discussed with patient.  For nutrition information, I recommend books:  1).Eat to Live by Dr Monico Hoar. 2).Prevent and Reverse Heart Disease by Dr Suzzette Righter. 3) Dr Katherina Right Book: Reversing Diabetes  Exercise recommendations are:  If unable to walk, then the patient can exercise in a chair 3 times a day. By flapping arms like a bird gently and raising legs outwards to the front.  If ambulatory, the patient can go for walks for 30 minutes 3 times a week. Then increase the intensity and duration as tolerated.  Goal is to try to attain exercise frequency to 5 times a week.  If applicable: Best to perform resistance exercises (machines or weights) 2 days a week and cardio type exercises 3 days per week.   RTc 3 months.  Uzma Hellmer P. [place] Charon, M.D.

## 2012-10-03 NOTE — Patient Instructions (Addendum)
      Dr Jenesa Foresta's Recommendations  Diet and Exercise discussed with patient.  For nutrition information, I recommend books:  1).Eat to Live by Dr Joel Fuhrman. 2).Prevent and Reverse Heart Disease by Dr Caldwell Esselstyn. 3) Dr Neal Barnard's Book: Reversing Diabetes  Exercise recommendations are:  If unable to walk, then the patient can exercise in a chair 3 times a day. By flapping arms like a bird gently and raising legs outwards to the front.  If ambulatory, the patient can go for walks for 30 minutes 3 times a week. Then increase the intensity and duration as tolerated.  Goal is to try to attain exercise frequency to 5 times a week.  If applicable: Best to perform resistance exercises (machines or weights) 2 days a week and cardio type exercises 3 days per week.  

## 2012-10-04 LAB — VITAMIN D 25 HYDROXY (VIT D DEFICIENCY, FRACTURES): Vit D, 25-Hydroxy: 41 ng/mL (ref 30–89)

## 2012-10-05 LAB — NMR LIPOPROFILE WITH LIPIDS
Cholesterol, Total: 129 mg/dL (ref <200)
HDL Particle Number: 28.8 umol/L — ABNORMAL LOW (ref >=30.5)
HDL Size: 8.3 nm — ABNORMAL LOW (ref >=9.2)
HDL-C: 29 mg/dL — ABNORMAL LOW (ref >=40)
LDL (calc): 47 mg/dL (ref <100)
LDL Particle Number: 510 nmol/L (ref <1000)
LDL Size: 19.7 nm — ABNORMAL LOW (ref >20.5)
LP-IR Score: 86 — ABNORMAL HIGH (ref <=45)
Large HDL-P: <1.3 umol/L — ABNORMAL LOW (ref >=4.8)
Large VLDL-P: 15.9 nmol/L — ABNORMAL HIGH (ref <=2.7)
Small LDL Particle Number: 340 nmol/L (ref <=527)
Triglycerides: 264 mg/dL — ABNORMAL HIGH (ref <150)
VLDL Size: 55.7 nm — ABNORMAL HIGH (ref <=46.6)

## 2012-10-09 ENCOUNTER — Telehealth: Payer: Self-pay | Admitting: Family Medicine

## 2012-10-10 ENCOUNTER — Encounter: Payer: Self-pay | Admitting: Family Medicine

## 2012-10-11 ENCOUNTER — Telehealth: Payer: Self-pay | Admitting: Family Medicine

## 2012-10-11 NOTE — Telephone Encounter (Signed)
Discussed with patient dyslipidemia and her lab results. Need to reduce the high glycemic foods. Limit juices and breads and pastas and white potatoes. Vegetables, high fiber low glycemic foods. Exercise daily. We had  Reduced the fenofibrate  Recently due to the very low LDLc. Discussed the delicate balance between the different particles and levels. Patient comprehends what she needs to do. Ka Flammer P. [place] Charon, M.D.

## 2012-10-11 NOTE — Telephone Encounter (Signed)
dw wong called the pt and discussed labs with her

## 2012-10-23 DIAGNOSIS — J209 Acute bronchitis, unspecified: Secondary | ICD-10-CM | POA: Insufficient documentation

## 2012-10-23 DIAGNOSIS — J45909 Unspecified asthma, uncomplicated: Secondary | ICD-10-CM | POA: Insufficient documentation

## 2012-11-02 ENCOUNTER — Encounter: Payer: Self-pay | Admitting: Family Medicine

## 2012-11-02 ENCOUNTER — Ambulatory Visit (INDEPENDENT_AMBULATORY_CARE_PROVIDER_SITE_OTHER): Payer: Medicare Other | Admitting: Family Medicine

## 2012-11-02 VITALS — BP 133/81 | HR 73 | Temp 97.2°F | Wt 237.8 lb

## 2012-11-02 DIAGNOSIS — R05 Cough: Secondary | ICD-10-CM

## 2012-11-02 DIAGNOSIS — R059 Cough, unspecified: Secondary | ICD-10-CM

## 2012-11-02 DIAGNOSIS — J209 Acute bronchitis, unspecified: Secondary | ICD-10-CM

## 2012-11-02 MED ORDER — TRIAMCINOLONE ACETONIDE 40 MG/ML IJ SUSP
60.0000 mg | Freq: Once | INTRAMUSCULAR | Status: AC
  Administered 2012-11-02: 60 mg via INTRAMUSCULAR

## 2012-11-02 MED ORDER — BENZONATATE 100 MG PO CAPS: 100.0000 mg | ORAL_CAPSULE | Freq: Two times a day (BID) | ORAL | Status: DC | PRN

## 2012-11-02 MED ORDER — AZITHROMYCIN 250 MG PO TABS: ORAL_TABLET | ORAL | Status: DC

## 2012-11-02 NOTE — Progress Notes (Signed)
Tolerated injection well. 

## 2012-11-02 NOTE — Progress Notes (Signed)
  Subjective:    Patient ID: Karen Potter, female    DOB: Aug 13, 1940, 72 y.o.   MRN: [id number]  HPI  This 72 y.o. female presents for evaluation of cough and cold sx's for over 2 weeks.  Patient states she was seen over a week ago in [place] for bronchitis and she was tx'd with zpak and medrol dose pack for bronchits sx's.  She has been using ventolin MDI for wheezing and she is better but she is coughing a lot at night time and she is still wheezing.  She has been having these bronchitis flares whenever she goes to [place].  Review of Systems    No chest pain, SOB, HA, dizziness, vision change, N/V, diarrhea, constipation, dysuria, urinary urgency or frequency, myalgias, arthralgias or rash.  Objective:   Physical Exam  Vital signs noted  Well developed well nourished female.  HEENT - Head atraumatic Normocephalic                Eyes - PERRLA, Conjuctiva - clear Sclera- Clear EOMI                Ears - EAC's Wnl TM's Wnl Gross Hearing WNL                Nose - Nares patent                 Throat - oropharanx wnl Respiratory - Lungs CTA bilateral Cardiac - RRR S1 and S2 without murmur GI - Abdomen soft Nontender and bowel sounds active x 4 Extremities - No edema. Neuro - Grossly intact.      Assessment & Plan:  Acute bronchitis - Plan: triamcinolone acetonide (KENALOG-40) injection 60 mg, azithromycin (ZITHROMAX) 250 MG tablet, benzonatate (TESSALON) 100 MG capsule.  Symbicort 160/4.5mg  2 puffs bid #1 sample given, continue ventolin MDI for rescue and follow up in 2 weeks if not better or prn. Discussed importance of pushing po fluids, mucinex otc will also be helpful.  Cough - Plan: triamcinolone acetonide (KENALOG-40) injection 60 mg, azithromycin (ZITHROMAX) 250 MG tablet, benzonatate (TESSALON) 100 MG capsule

## 2012-11-02 NOTE — Patient Instructions (Addendum)
Bronchitis Bronchitis is the body's way of reacting to injury and/or infection (inflammation) of the bronchi. Bronchi are the air tubes that extend from the windpipe into the lungs. If the inflammation becomes severe, it may cause shortness of breath. CAUSES  Inflammation may be caused by:  A virus.  Germs (bacteria).  Dust.  Allergens.  Pollutants and many other irritants. The cells lining the bronchial tree are covered with tiny hairs (cilia). These constantly beat upward, away from the lungs, toward the mouth. This keeps the lungs free of pollutants. When these cells become too irritated and are unable to do their job, mucus begins to develop. This causes the characteristic cough of bronchitis. The cough clears the lungs when the cilia are unable to do their job. Without either of these protective mechanisms, the mucus would settle in the lungs. Then you would develop pneumonia. Smoking is a common cause of bronchitis and can contribute to pneumonia. Stopping this habit is the single most important thing you can do to help yourself. TREATMENT   Your caregiver may prescribe an antibiotic if the cough is caused by bacteria. Also, medicines that open up your airways make it easier to breathe. Your caregiver may also recommend or prescribe an expectorant. It will loosen the mucus to be coughed up. Only take over-the-counter or prescription medicines for pain, discomfort, or fever as directed by your caregiver.  Removing whatever causes the problem (smoking, for example) is critical to preventing the problem from getting worse.  Cough suppressants may be prescribed for relief of cough symptoms.  Inhaled medicines may be prescribed to help with symptoms now and to help prevent problems from returning.  For those with recurrent (chronic) bronchitis, there may be a need for steroid medicines. SEEK IMMEDIATE MEDICAL CARE IF:   During treatment, you develop more pus-like mucus (purulent  sputum).  You have a fever.  Your baby is older than 3 months with a rectal temperature of 102 F (38.9 C) or higher.  Your baby is 5 months old or younger with a rectal temperature of 100.4 F (38 C) or higher.  You become progressively more ill.  You have increased difficulty breathing, wheezing, or shortness of breath. It is necessary to seek immediate medical care if you are elderly or sick from any other disease. MAKE SURE YOU:   Understand these instructions.  Will watch your condition.  Will get help right away if you are not doing well or get worse. Document Released: 04/26/2005 Document Revised: 07/19/2011 Document Reviewed: 03/05/2008 [facility] Patient Information 2014 [place], [place]. Triamcinolone injection What is this medicine? TRIAMCINOLONE (trye am SIN [place]) is a corticosteroid. It helps to reduce swelling, redness, itching, and allergic reactions. This medicine is used to treat allergies, arthritis, asthma, skin problems, and many other conditions. This medicine may be used for other purposes; ask your health care provider or pharmacist if you have questions. What should I tell my health care provider before I take this medicine? They need to know if you have any of these conditions: -depression, anxiety, or psychotic disturbances -diabetes -infection, like tuberculosis, herpes, or fungal infection -liver disease -osteoporosis -previous heart attack -seizures -stomach or intestine disease -thyroid disease -an unusual or allergic reaction to triamcinolone, corticosteroids, benzyl alcohol, other medicines, foods, dyes, or preservatives -pregnant or trying to get pregnant -breast-feeding How should I use this medicine? This medicine is injected by a health care professional. After your dose follow your doctor's instructions for your care. Contact your pediatrician regarding  the use of this medicine in children. Special care may be needed. Overdosage: If you  think you have taken too much of this medicine contact a poison control center or emergency room at once. NOTE: This medicine is only for you. Do not share this medicine with others. What if I miss a dose? This does not apply. What may interact with this medicine? Do not take this medicine with any of the following medications: -mifepristone This medicine may also interact with the following medications: -aspirin -other steroid medicines -vaccines and other immunization products This list may not describe all possible interactions. Give your health care provider a list of all the medicines, herbs, non-prescription drugs, or dietary supplements you use. Also tell them if you smoke, drink alcohol, or use illegal drugs. Some items may interact with your medicine. What should I watch for while using this medicine? Visit your doctor or health care professional for regular checks on your progress. If you are diabetic, check your blood sugar as directed. If you are taking this medicine for a long time, carry an identification card with your name, the type and dose of medicine, and your doctor's name and address. You may need to be on a special diet while taking this medicine. Talk to your doctor. Do not come in contact with people who have chickenpox or the measles while you are taking this medicine. If you do, call your doctor right away. What side effects may I notice from receiving this medicine? Side effects that you should report to your doctor or health care professional as soon as possible: -allergic reactions like skin rash, itching or hives, swelling of the face, lips, or tongue -black, tarry stools -breathing problems -changes in vision -confusion, depression, excitement, mood swings -dizziness -fever, infection, sores that do not heal -frequent passing of urine -high blood pressure -increased thirst -lumpy, thin skin at site where injected -menstrual problems -pain in back, hips,  shoulders, ribs -rounding of face -seizures -stomach pain -swelling of feet, hands -unusual bruising or red pinpoint spots on the skin -unusually weak or tired Side effects that usually do not require medical attention (report to your doctor or health care professional if they continue or are bothersome): -headache -increased sweating -trouble sleeping -unusual increased growth of hair on the face or body -upset stomach, nausea This list may not describe all possible side effects. Call your doctor for medical advice about side effects. You may report side effects to FDA at 1-800-FDA-1088. Where should I keep my medicine? Keep out of the reach of children. Store at room temperature between 15 and 30 degrees C (59 and 86 degrees F). Do not freeze. Protect from light. Throw away any unused medicine after the expiration date. NOTE: This sheet is a summary. It may not cover all possible information. If you have questions about this medicine, talk to your doctor, pharmacist, or health care provider.  2013, [employer]. (07/07/2007 4:14:48 PM)

## 2012-11-08 ENCOUNTER — Other Ambulatory Visit: Payer: Self-pay | Admitting: Family Medicine

## 2012-12-16 ENCOUNTER — Other Ambulatory Visit: Payer: Self-pay | Admitting: Nurse Practitioner

## 2012-12-21 ENCOUNTER — Other Ambulatory Visit: Payer: Self-pay

## 2012-12-21 MED ORDER — LEVOTHYROXINE SODIUM 150 MCG PO TABS: 150.0000 ug | ORAL_TABLET | Freq: Every day | ORAL | Status: DC

## 2013-01-04 ENCOUNTER — Encounter: Payer: Self-pay | Admitting: Family Medicine

## 2013-01-04 ENCOUNTER — Ambulatory Visit (INDEPENDENT_AMBULATORY_CARE_PROVIDER_SITE_OTHER): Payer: Medicare Other | Admitting: Family Medicine

## 2013-01-04 VITALS — BP 148/93 | HR 67 | Temp 97.7°F | Wt 243.2 lb

## 2013-01-04 DIAGNOSIS — E1159 Type 2 diabetes mellitus with other circulatory complications: Secondary | ICD-10-CM | POA: Insufficient documentation

## 2013-01-04 DIAGNOSIS — R7309 Other abnormal glucose: Secondary | ICD-10-CM

## 2013-01-04 DIAGNOSIS — R209 Unspecified disturbances of skin sensation: Secondary | ICD-10-CM

## 2013-01-04 DIAGNOSIS — R413 Other amnesia: Secondary | ICD-10-CM | POA: Insufficient documentation

## 2013-01-04 DIAGNOSIS — G43909 Migraine, unspecified, not intractable, without status migrainosus: Secondary | ICD-10-CM | POA: Insufficient documentation

## 2013-01-04 DIAGNOSIS — E559 Vitamin D deficiency, unspecified: Secondary | ICD-10-CM | POA: Insufficient documentation

## 2013-01-04 DIAGNOSIS — I10 Essential (primary) hypertension: Secondary | ICD-10-CM | POA: Insufficient documentation

## 2013-01-04 DIAGNOSIS — G459 Transient cerebral ischemic attack, unspecified: Secondary | ICD-10-CM

## 2013-01-04 DIAGNOSIS — R202 Paresthesia of skin: Secondary | ICD-10-CM

## 2013-01-04 DIAGNOSIS — R739 Hyperglycemia, unspecified: Secondary | ICD-10-CM | POA: Insufficient documentation

## 2013-01-04 DIAGNOSIS — I152 Hypertension secondary to endocrine disorders: Secondary | ICD-10-CM | POA: Insufficient documentation

## 2013-01-04 DIAGNOSIS — G4733 Obstructive sleep apnea (adult) (pediatric): Secondary | ICD-10-CM

## 2013-01-04 DIAGNOSIS — A809 Acute poliomyelitis, unspecified: Secondary | ICD-10-CM | POA: Insufficient documentation

## 2013-01-04 DIAGNOSIS — E785 Hyperlipidemia, unspecified: Secondary | ICD-10-CM

## 2013-01-04 DIAGNOSIS — E039 Hypothyroidism, unspecified: Secondary | ICD-10-CM

## 2013-01-04 DIAGNOSIS — J302 Other seasonal allergic rhinitis: Secondary | ICD-10-CM | POA: Insufficient documentation

## 2013-01-04 DIAGNOSIS — E669 Obesity, unspecified: Secondary | ICD-10-CM | POA: Insufficient documentation

## 2013-01-04 LAB — POCT GLYCOSYLATED HEMOGLOBIN (HGB A1C): Hemoglobin A1C: 5.7

## 2013-01-04 MED ORDER — LOSARTAN POTASSIUM 100 MG PO TABS: ORAL_TABLET | ORAL | Status: DC

## 2013-01-04 NOTE — Patient Instructions (Signed)
      Dr Zafirah Vanzee's Recommendations  For nutrition information, I recommend books:  1).Eat to Live by Dr Joel Fuhrman. 2).Prevent and Reverse Heart Disease by Dr Caldwell Esselstyn. 3) Dr Neal Barnard's Book:  Program to Reverse Diabetes  Exercise recommendations are:  If unable to walk, then the patient can exercise in a chair 3 times a day. By flapping arms like a bird gently and raising legs outwards to the front.  If ambulatory, the patient can go for walks for 30 minutes 3 times a week. Then increase the intensity and duration as tolerated.  Goal is to try to attain exercise frequency to 5 times a week.  If applicable: Best to perform resistance exercises (machines or weights) 2 days a week and cardio type exercises 3 days per week.  

## 2013-01-04 NOTE — Progress Notes (Signed)
Patient ID: Karen Potter, female   DOB: [birth year], 71 y.o.   MRN: [id number] SUBJECTIVE: CC: Chief Complaint  Patient presents with  . Follow-up    3 month follow up c/o twinges in legs     HPI: Patient is here for follow up of hypertension: denies Headache;deniesChest Pain;denies weakness;denies Shortness of Breath or Orthopnea;denies Visual changes;denies palpitations;denies cough;denies pedal edema;denies symptoms of TIA or stroke; admits to Compliance with medications. denies Problems with medications.  Breakfast: cereal,go lean, milk 2% Lunch: chicken tenders, slaw 1/2 a baked potato Supper last night 1/2 banana sandwich.  Exercise: not much.  Not following the dietary recommendations: Eat to LIve. She has however cut down on the sodas.  Past Medical History  Diagnosis Date  . Amnesia   . Hypothyroid   . Hypertension   . Seasonal allergies   . Polio   . Migraine    Past Surgical History  Procedure Laterality Date  . Abdominal hysterectomy    . Cholecystectomy    . Carpal tunnel release    . Back surgery    . Knee surgery      right   History   Social History  . Marital Status: Married    Spouse Name: N/A    Number of Children: N/A  . Years of Education: N/A   Occupational History  . Not on file.   Social History Main Topics  . Smoking status: Never Smoker   . Smokeless tobacco: Not on file  . Alcohol Use: No  . Drug Use: No  . Sexual Activity: Not on file   Other Topics Concern  . Not on file   Social History Narrative  . No narrative on file   Family History  Problem Relation Age of Onset  . Heart failure Mother   . Cancer Father   . Cancer Brother    Current Outpatient Prescriptions on File Prior to Visit  Medication Sig Dispense Refill  . Casanthranol-Docusate Sodium 30-100 MG CAPS Take by mouth.      . clobetasol ointment (TEMOVATE) 0.05 % Apply 1 application topically 2 (two) times daily.      . clotrimazole-betamethasone  (LOTRISONE) cream Apply 1 application topically 2 (two) times daily.      . Docosahexaenoic Acid (DHA COMPLETE PO) Take 1 tablet by mouth daily.      Marland Kitchen docusate sodium (COLACE) 100 MG capsule Take 100 mg by mouth 2 (two) times daily.      . fenofibrate 54 MG tablet TAKE ONE TABLET BY MOUTH ONCE DAILY  30 tablet  4  . levothyroxine (SYNTHROID, LEVOTHROID) 150 MCG tablet Take 1 tablet (150 mcg total) by mouth daily.  30 tablet  8  . levothyroxine (SYNTHROID, LEVOTHROID) 175 MCG tablet Take 175 mcg by mouth every other day.      . losartan (COZAAR) 100 MG tablet TAKE ONE TABLET BY MOUTH EVERY DAY  90 tablet  1  . Magnesium Chloride (SLOW-MAG PO) Take 1 tablet by mouth.      . Multiple Vitamin (MULTIVITAMIN WITH MINERALS) TABS Take 1 tablet by mouth daily. Women's One A Day      . omeprazole (PRILOSEC) 20 MG capsule Take 20 mg by mouth daily.      . VENTOLIN HFA 108 (90 BASE) MCG/ACT inhaler       . aspirin 325 MG tablet Take 325 mg by mouth daily.      . benzonatate (TESSALON) 100 MG capsule Take 1 capsule (100 mg total)  by mouth 2 (two) times daily as needed for cough.  20 capsule  1   No current facility-administered medications on file prior to visit.   Allergies  Allergen Reactions  . Codeine Itching  . Other Itching    Most pain meds cause itching.  When has to take pain medication, she has been instructed to take Benadryl  . Doxycycline Rash   Immunization History  Administered Date(s) Administered  . Pneumococcal Polysaccharide 02/07/2009  . Tdap 05/10/2010  . Zoster 05/10/2010   Prior to Admission medications   Medication Sig Start Date End Date Taking? Authorizing Provider  Casanthranol-Docusate Sodium 30-100 MG CAPS Take by mouth.   Yes Historical Provider, MD  clobetasol ointment (TEMOVATE) 0.05 % Apply 1 application topically 2 (two) times daily.   Yes Historical Provider, MD  clotrimazole-betamethasone (LOTRISONE) cream Apply 1 application topically 2 (two) times daily.    Yes Historical Provider, MD  Docosahexaenoic Acid (DHA COMPLETE PO) Take 1 tablet by mouth daily.   Yes Historical Provider, MD  docusate sodium (COLACE) 100 MG capsule Take 100 mg by mouth 2 (two) times daily.   Yes Historical Provider, MD  fenofibrate 54 MG tablet TAKE ONE TABLET BY MOUTH ONCE DAILY 11/08/12  Yes Ileana Ladd, MD  guaiFENesin (MUCINEX) 600 MG 12 hr tablet Take 1,200 mg by mouth.   Yes Historical Provider, MD  levothyroxine (SYNTHROID, LEVOTHROID) 150 MCG tablet Take 1 tablet (150 mcg total) by mouth daily. 12/21/12  Yes Deatra Canter, FNP  levothyroxine (SYNTHROID, LEVOTHROID) 175 MCG tablet Take 175 mcg by mouth every other day.   Yes Historical Provider, MD  losartan (COZAAR) 100 MG tablet TAKE ONE TABLET BY MOUTH EVERY DAY 07/31/12  Yes Ileana Ladd, MD  Magnesium Chloride (SLOW-MAG PO) Take 1 tablet by mouth.   Yes Historical Provider, MD  Multiple Vitamin (MULTIVITAMIN WITH MINERALS) TABS Take 1 tablet by mouth daily. Women's One A Day   Yes Historical Provider, MD  omeprazole (PRILOSEC) 20 MG capsule Take 20 mg by mouth daily.   Yes Historical Provider, MD  VENTOLIN HFA 108 (90 BASE) MCG/ACT inhaler  10/23/12  Yes Historical Provider, MD  aspirin 325 MG tablet Take 325 mg by mouth daily.    Historical Provider, MD  benzonatate (TESSALON) 100 MG capsule Take 1 capsule (100 mg total) by mouth 2 (two) times daily as needed for cough. 11/02/12   Deatra Canter, FNP     ROS: As above in the HPI. All other systems are stable or negative.  OBJECTIVE: APPEARANCE:  Patient in no acute distress.The patient appeared well nourished and normally developed. Acyanotic. Waist: VITAL SIGNS:BP 148/93  Pulse 67  Temp(Src) 97.7 F (36.5 C) (Oral)  Wt 243 lb 3.2 oz (110.315 kg) Obese WM Weight increased  SKIN: warm and  Dry without overt rashes, tattoos and scars  HEAD and Neck: without JVD, Head and scalp: normal Eyes:No scleral icterus. Fundi normal, eye movements  normal. Ears: Auricle normal, canal normal, Tympanic membranes normal, insufflation normal. Nose: normal Throat: normal Neck & thyroid: normal  CHEST & LUNGS: Chest wall: normal Lungs: Clear  CVS: Reveals the PMI to be normally located. Regular rhythm, First and Second Heart sounds are normal,  absence of murmurs, rubs or gallops. Peripheral vasculature: Radial pulses: normal Dorsal pedis pulses: normal Posterior pulses: normal  ABDOMEN:  Appearance: Obese Benign, no organomegaly, no masses, no Abdominal Aortic enlargement. No Guarding , no rebound. No Bruits. Bowel sounds: normal  RECTAL: N/A  GU: N/A  EXTREMETIES: nonedematous.  MUSCULOSKELETAL:  Spine: normal Joints: intact  NEUROLOGIC: oriented to time,place and person; nonfocal. Strength is normal Sensory is normal Reflexes are normal Cranial Nerves are normal.  ASSESSMENT: Hyperglycemia - Plan: POCT glycosylated hemoglobin (Hb A1C)  Hyperlipidemia - Plan: CMP14+EGFR, NMR, lipoprofile  HYPERTENSION - Plan: CMP14+EGFR, losartan (COZAAR) 100 MG tablet  Hypothyroid - Plan: TSH  Obesity  OBSTRUCTIVE SLEEP APNEA  TIA (transient ischemic attack)  Vitamin D deficiency - Plan: Vit D  25 hydroxy (rtn osteoporosis monitoring)   PLAN:      Dr Woodroe Mode Recommendations  For nutrition information, I recommend books:  1).Eat to Live by Dr Monico Hoar. 2).Prevent and Reverse Heart Disease by Dr Suzzette Righter. 3) Dr Katherina Right Book:  Program to Reverse Diabetes  Exercise recommendations are:  If unable to walk, then the patient can exercise in a chair 3 times a day. By flapping arms like a bird gently and raising legs outwards to the front.  If ambulatory, the patient can go for walks for 30 minutes 3 times a week. Then increase the intensity and duration as tolerated.  Goal is to try to attain exercise frequency to 5 times a week.  If applicable: Best to perform resistance exercises  (machines or weights) 2 days a week and cardio type exercises 3 days per week.   Encouraged the need to change recipes for meals to include lots of vegetables and include fruits in the diet and avoid processed foods.  Orders Placed This Encounter  Procedures  . CMP14+EGFR  . NMR, lipoprofile  . TSH  . Vit D  25 hydroxy (rtn osteoporosis monitoring)  . POCT glycosylated hemoglobin (Hb A1C)    Meds ordered this encounter  Medications  . guaiFENesin (MUCINEX) 600 MG 12 hr tablet    Sig: Take 1,200 mg by mouth.  . losartan (COZAAR) 100 MG tablet    Sig: TAKE ONE TABLET BY MOUTH EVERY DAY    Dispense:  90 tablet    Refill:  3    Return in about 3 months (around 04/06/2013) for Recheck medical problems.  Karen Potter, M.D.

## 2013-01-06 LAB — NMR, LIPOPROFILE
Cholesterol: 101 mg/dL (ref <200)
HDL Cholesterol by NMR: 38 mg/dL — ABNORMAL LOW (ref >=40)
HDL Particle Number: 35.4 umol/L (ref >=30.5)
LDL Particle Number: 580 nmol/L (ref <1000)
LDL Size: 21 nm (ref >20.5)
LDLC SERPL CALC-MCNC: 37 mg/dL (ref <100)
LP-IR Score: 79 — ABNORMAL HIGH (ref <=45)
Small LDL Particle Number: 330 nmol/L (ref <=527)
Triglycerides by NMR: 128 mg/dL (ref <150)

## 2013-01-06 LAB — CMP14+EGFR
ALT: 48 IU/L — ABNORMAL HIGH (ref 0–32)
AST: 34 IU/L (ref 0–40)
Albumin/Globulin Ratio: 1.8 (ref 1.1–2.5)
Albumin: 4.5 g/dL (ref 3.5–4.8)
Alkaline Phosphatase: 96 IU/L (ref 39–117)
BUN/Creatinine Ratio: 17 (ref 11–26)
BUN: 15 mg/dL (ref 8–27)
CO2: 24 mmol/L (ref 18–29)
Calcium: 10.7 mg/dL — ABNORMAL HIGH (ref 8.6–10.2)
Chloride: 104 mmol/L (ref 97–108)
Creatinine, Ser: 0.88 mg/dL (ref 0.57–1.00)
GFR calc Af Amer: 76 mL/min/{1.73_m2} (ref >59)
GFR calc non Af Amer: 66 mL/min/{1.73_m2} (ref >59)
Globulin, Total: 2.5 g/dL (ref 1.5–4.5)
Glucose: 111 mg/dL — ABNORMAL HIGH (ref 65–99)
Potassium: 4.4 mmol/L (ref 3.5–5.2)
Sodium: 142 mmol/L (ref 134–144)
Total Bilirubin: 0.3 mg/dL (ref 0.0–1.2)
Total Protein: 7 g/dL (ref 6.0–8.5)

## 2013-01-06 LAB — VITAMIN D 25 HYDROXY (VIT D DEFICIENCY, FRACTURES): Vit D, 25-Hydroxy: 38.9 ng/mL (ref 30.0–100.0)

## 2013-01-06 LAB — TSH: TSH: 4.81 u[IU]/mL — ABNORMAL HIGH (ref 0.450–4.500)

## 2013-01-06 LAB — VITAMIN B12: Vitamin B-12: 492 pg/mL (ref 211–946)

## 2013-01-06 LAB — FOLATE: Folate: >19.9 ng/mL (ref >3.0)

## 2013-01-10 ENCOUNTER — Other Ambulatory Visit: Payer: Self-pay | Admitting: Family Medicine

## 2013-01-10 DIAGNOSIS — E785 Hyperlipidemia, unspecified: Secondary | ICD-10-CM

## 2013-01-10 MED ORDER — FENOFIBRATE 48 MG PO TABS: ORAL_TABLET | ORAL | Status: DC

## 2013-02-08 ENCOUNTER — Other Ambulatory Visit: Payer: Self-pay | Admitting: Family Medicine

## 2013-02-08 DIAGNOSIS — Z139 Encounter for screening, unspecified: Secondary | ICD-10-CM

## 2013-02-15 ENCOUNTER — Ambulatory Visit (HOSPITAL_COMMUNITY)
Admission: RE | Admit: 2013-02-15 | Discharge: 2013-02-15 | Disposition: A | Discharge status: Home / Self Care | Payer: Medicare Other | Source: Ambulatory Visit | Attending: Family Medicine | Admitting: Family Medicine

## 2013-02-15 DIAGNOSIS — Z1231 Encounter for screening mammogram for malignant neoplasm of breast: Secondary | ICD-10-CM | POA: Insufficient documentation

## 2013-02-15 DIAGNOSIS — Z139 Encounter for screening, unspecified: Secondary | ICD-10-CM

## 2013-03-15 ENCOUNTER — Other Ambulatory Visit: Payer: Self-pay

## 2013-03-20 ENCOUNTER — Ambulatory Visit (INDEPENDENT_AMBULATORY_CARE_PROVIDER_SITE_OTHER): Payer: Medicare Other | Admitting: Family Medicine

## 2013-03-20 ENCOUNTER — Encounter: Payer: Self-pay | Admitting: Family Medicine

## 2013-03-20 VITALS — BP 140/82 | HR 85 | Temp 98.2°F | Ht 64.5 in | Wt 246.8 lb

## 2013-03-20 DIAGNOSIS — J069 Acute upper respiratory infection, unspecified: Secondary | ICD-10-CM

## 2013-03-20 MED ORDER — AZITHROMYCIN 250 MG PO TABS: ORAL_TABLET | ORAL | Status: DC

## 2013-03-20 NOTE — Patient Instructions (Signed)

## 2013-03-20 NOTE — Progress Notes (Signed)
  Subjective:    Patient ID: Karen Potter, female    DOB: 1941-02-06, 72 y.o.   MRN: [id number]  HPI  This 72 y.o. female presents for evaluation of uri sx's for over a week.  Review of Systems C/o URI sx's   No chest pain, SOB, HA, dizziness, vision change, N/V, diarrhea, constipation, dysuria, urinary urgency or frequency, myalgias, arthralgias or rash.  Objective:   Physical Exam Vital signs noted  Well developed well nourished female.  HEENT - Head atraumatic Normocephalic                Eyes - PERRLA, Conjuctiva - clear Sclera- Clear EOMI                Ears - EAC's Wnl TM's Wnl Gross Hearing WNL                Nose - Nares patent                 Throat - oropharanx wnl Respiratory - Lungs CTA bilateral Cardiac - RRR S1 and S2 without murmur GI - Abdomen soft Nontender and bowel sounds active x 4 Extremities - No edema. Neuro - Grossly intact.       Assessment & Plan:  URI, acute - Plan: azithromycin (ZITHROMAX) 250 MG tablet Push po fluids,rest, tylenol and motrin otc prn.  Deatra Canter FNP

## 2013-04-10 ENCOUNTER — Ambulatory Visit (INDEPENDENT_AMBULATORY_CARE_PROVIDER_SITE_OTHER): Payer: Medicare Other | Admitting: Family Medicine

## 2013-04-10 ENCOUNTER — Encounter: Payer: Self-pay | Admitting: Family Medicine

## 2013-04-10 VITALS — BP 159/92 | HR 82 | Temp 97.8°F | Ht 64.5 in | Wt 246.6 lb

## 2013-04-10 DIAGNOSIS — G459 Transient cerebral ischemic attack, unspecified: Secondary | ICD-10-CM

## 2013-04-10 DIAGNOSIS — G43909 Migraine, unspecified, not intractable, without status migrainosus: Secondary | ICD-10-CM

## 2013-04-10 DIAGNOSIS — R413 Other amnesia: Secondary | ICD-10-CM

## 2013-04-10 DIAGNOSIS — J309 Allergic rhinitis, unspecified: Secondary | ICD-10-CM

## 2013-04-10 DIAGNOSIS — Z9089 Acquired absence of other organs: Secondary | ICD-10-CM

## 2013-04-10 DIAGNOSIS — E785 Hyperlipidemia, unspecified: Secondary | ICD-10-CM

## 2013-04-10 DIAGNOSIS — I10 Essential (primary) hypertension: Secondary | ICD-10-CM

## 2013-04-10 DIAGNOSIS — E559 Vitamin D deficiency, unspecified: Secondary | ICD-10-CM

## 2013-04-10 DIAGNOSIS — R350 Frequency of micturition: Secondary | ICD-10-CM

## 2013-04-10 DIAGNOSIS — IMO0001 Reserved for inherently not codable concepts without codable children: Secondary | ICD-10-CM

## 2013-04-10 DIAGNOSIS — E039 Hypothyroidism, unspecified: Secondary | ICD-10-CM

## 2013-04-10 DIAGNOSIS — R7309 Other abnormal glucose: Secondary | ICD-10-CM

## 2013-04-10 DIAGNOSIS — A809 Acute poliomyelitis, unspecified: Secondary | ICD-10-CM

## 2013-04-10 DIAGNOSIS — R739 Hyperglycemia, unspecified: Secondary | ICD-10-CM

## 2013-04-10 DIAGNOSIS — E669 Obesity, unspecified: Secondary | ICD-10-CM

## 2013-04-10 DIAGNOSIS — J302 Other seasonal allergic rhinitis: Secondary | ICD-10-CM

## 2013-04-10 DIAGNOSIS — R4701 Aphasia: Secondary | ICD-10-CM

## 2013-04-10 DIAGNOSIS — G4733 Obstructive sleep apnea (adult) (pediatric): Secondary | ICD-10-CM

## 2013-04-10 LAB — POCT URINALYSIS DIPSTICK
Bilirubin, UA: NEGATIVE
Glucose, UA: NEGATIVE
Ketones, UA: NEGATIVE
Nitrite, UA: POSITIVE
Protein, UA: NEGATIVE
Spec Grav, UA: 1.01
Urobilinogen, UA: NEGATIVE
pH, UA: 6.5

## 2013-04-10 LAB — POCT GLYCOSYLATED HEMOGLOBIN (HGB A1C): Hemoglobin A1C: 5.7

## 2013-04-10 LAB — POCT UA - MICROSCOPIC ONLY
Casts, Ur, LPF, POC: NEGATIVE
Crystals, Ur, HPF, POC: NEGATIVE
Mucus, UA: NEGATIVE
Yeast, UA: NEGATIVE

## 2013-04-10 NOTE — Patient Instructions (Signed)
      Dr Magnum Lunde's Recommendations  For nutrition information, I recommend books:  1).Eat to Live by Dr Joel Fuhrman. 2).Prevent and Reverse Heart Disease by Dr Caldwell Esselstyn. 3) Dr Neal Barnard's Book:  Program to Reverse Diabetes  Exercise recommendations are:  If unable to walk, then the patient can exercise in a chair 3 times a day. By flapping arms like a bird gently and raising legs outwards to the front.  If ambulatory, the patient can go for walks for 30 minutes 3 times a week. Then increase the intensity and duration as tolerated.  Goal is to try to attain exercise frequency to 5 times a week.  If applicable: Best to perform resistance exercises (machines or weights) 2 days a week and cardio type exercises 3 days per week.  

## 2013-04-10 NOTE — Progress Notes (Signed)
Patient ID: Karen Potter, female   DOB: 15-Mar-1941, 72 y.o.   MRN: [id number] SUBJECTIVE: CC: Chief Complaint  Patient presents with  . Follow-up    3 MONTH FOLLOW UP    HPI: Patient is here for follow up of hyperlipidemia/HTN/Obesity: denies Headache;denies Chest Pain;denies weakness;denies Shortness of Breath and orthopnea;denies Visual changes;denies palpitations;denies cough;denies pedal edema;denies symptoms of TIA or stroke;deniesClaudication symptoms. admits to Compliance with medications; denies Problems with medications.  Breakfast: oatmeal or bacon/sausage and eggs., grits, gravy Lunch: pot roast, salad today dinner : Eating out tonight. Last time fish , sweet potato and salad.  Exercise: just moving around.   Past Medical History  Diagnosis Date  . Amnesia   . Polio   . Migraine   . Hyperglycemia   . Hyperlipidemia   . Hypertension   . Hypothyroid   . Seasonal allergies   . Vitamin D deficiency   . Obesity    Past Surgical History  Procedure Laterality Date  . Abdominal hysterectomy    . Cholecystectomy    . Carpal tunnel release    . Back surgery    . Knee surgery      right   History   Social History  . Marital Status: Married    Spouse Name: N/A    Number of Children: N/A  . Years of Education: N/A   Occupational History  . Not on file.   Social History Main Topics  . Smoking status: Never Smoker   . Smokeless tobacco: Not on file  . Alcohol Use: No  . Drug Use: No  . Sexual Activity: Not on file   Other Topics Concern  . Not on file   Social History Narrative  . No narrative on file   Family History  Problem Relation Age of Onset  . Heart failure Mother   . Cancer Father   . Cancer Brother    Current Outpatient Prescriptions on File Prior to Visit  Medication Sig Dispense Refill  . aspirin 325 MG tablet Take 325 mg by mouth daily.      Jennette Banker Sodium 30-100 MG CAPS Take by mouth.      . clobetasol ointment  (TEMOVATE) 0.05 % Apply 1 application topically 2 (two) times daily.      . clotrimazole-betamethasone (LOTRISONE) cream Apply 1 application topically 2 (two) times daily.      . Docosahexaenoic Acid (DHA COMPLETE PO) Take 1 tablet by mouth daily.      Marland Kitchen docusate sodium (COLACE) 100 MG capsule Take 100 mg by mouth 2 (two) times daily.      . fenofibrate (TRICOR) 48 MG tablet TAKE ONE TABLET BY MOUTH ONCE DAILY  30 tablet  4  . guaiFENesin (MUCINEX) 600 MG 12 hr tablet Take 1,200 mg by mouth.      . levothyroxine (SYNTHROID, LEVOTHROID) 175 MCG tablet Take 175 mcg by mouth every other day. 5 days a week.      . losartan (COZAAR) 100 MG tablet TAKE ONE TABLET BY MOUTH EVERY DAY  90 tablet  3  . Multiple Vitamin (MULTIVITAMIN WITH MINERALS) TABS Take 1 tablet by mouth daily. Women's One A Day      . VENTOLIN HFA 108 (90 BASE) MCG/ACT inhaler       . Magnesium Chloride (SLOW-MAG PO) Take 1 tablet by mouth.      Marland Kitchen omeprazole (PRILOSEC) 20 MG capsule Take 20 mg by mouth daily.       No current facility-administered  medications on file prior to visit.   Allergies  Allergen Reactions  . Codeine Itching  . Other Itching    Most pain meds cause itching.  When has to take pain medication, she has been instructed to take Benadryl  . Doxycycline Rash   Immunization History  Administered Date(s) Administered  . Influenza, High Dose Seasonal PF 03/20/2013  . Pneumococcal Polysaccharide-23 02/07/2009  . Tdap 05/10/2010  . Zoster 05/10/2010   Prior to Admission medications   Medication Sig Start Date End Date Taking? Authorizing Provider  aspirin 325 MG tablet Take 325 mg by mouth daily.   Yes Historical Provider, MD  benzonatate (TESSALON) 200 MG capsule Take 200 mg by mouth 3 (three) times daily as needed for cough.   Yes Historical Provider, MD  Casanthranol-Docusate Sodium 30-100 MG CAPS Take by mouth.   Yes Historical Provider, MD  Cholecalciferol (VITAMIN D3) 2000 UNITS capsule Take 2,000  Units by mouth daily.   Yes Historical Provider, MD  clobetasol ointment (TEMOVATE) 0.05 % Apply 1 application topically 2 (two) times daily.   Yes Historical Provider, MD  clotrimazole-betamethasone (LOTRISONE) cream Apply 1 application topically 2 (two) times daily.   Yes Historical Provider, MD  dextromethorphan (DELSYM) 30 MG/5ML liquid Take by mouth as needed for cough.   Yes Historical Provider, MD  diphenoxylate-atropine (LOMOTIL) 2.5-0.025 MG per tablet Take by mouth 4 (four) times daily as needed for diarrhea or loose stools.   Yes Historical Provider, MD  Docosahexaenoic Acid (DHA COMPLETE PO) Take 1 tablet by mouth daily.   Yes Historical Provider, MD  docusate sodium (COLACE) 100 MG capsule Take 100 mg by mouth 2 (two) times daily.   Yes Historical Provider, MD  fenofibrate (TRICOR) 48 MG tablet TAKE ONE TABLET BY MOUTH ONCE DAILY 01/10/13  Yes Ileana Ladd, MD  guaiFENesin (MUCINEX) 600 MG 12 hr tablet Take 1,200 mg by mouth.   Yes Historical Provider, MD  levothyroxine (SYNTHROID, LEVOTHROID) 150 MCG tablet Take 1 tablet (150 mcg total) by mouth daily. 12/21/12  Yes Deatra Canter, FNP  levothyroxine (SYNTHROID, LEVOTHROID) 175 MCG tablet Take 175 mcg by mouth every other day.   Yes Historical Provider, MD  losartan (COZAAR) 100 MG tablet TAKE ONE TABLET BY MOUTH EVERY DAY 01/04/13  Yes Ileana Ladd, MD  Multiple Vitamin (MULTIVITAMIN WITH MINERALS) TABS Take 1 tablet by mouth daily. Women's One A Day   Yes Historical Provider, MD  VENTOLIN HFA 108 (90 BASE) MCG/ACT inhaler  10/23/12  Yes Historical Provider, MD  Magnesium Chloride (SLOW-MAG PO) Take 1 tablet by mouth.    Historical Provider, MD  omeprazole (PRILOSEC) 20 MG capsule Take 20 mg by mouth daily.    Historical Provider, MD     ROS: As above in the HPI. All other systems are stable or negative.  OBJECTIVE: APPEARANCE:  Patient in no acute distress.The patient appeared well nourished and normally developed.  Acyanotic. Waist: VITAL SIGNS:BP 159/92  Pulse 82  Temp(Src) 97.8 F (36.6 C) (Oral)  Ht 5' 4.5" (1.638 m)  Wt 246 lb 9.6 oz (111.857 kg)  BMI 41.69 kg/m2  Obesity WF  SKIN: warm and  Dry without overt rashes, tattoos and scars  HEAD and Neck: without JVD, Head and scalp: normal Eyes:No scleral icterus. Fundi normal, eye movements normal. Ears: Auricle normal, canal normal, Tympanic membranes normal, insufflation normal. Nose: normal Throat: normal Neck & thyroid: normal  CHEST & LUNGS: Chest wall: normal Lungs: Clear  CVS: Reveals the PMI  to be normally located. Regular rhythm, First and Second Heart sounds are normal,  absence of murmurs, rubs or gallops. Peripheral vasculature: Radial pulses: normal Dorsal pedis pulses: normal Posterior pulses: normal  ABDOMEN:  Appearance: obese Benign, no organomegaly, no masses, no Abdominal Aortic enlargement. No Guarding , no rebound. No Bruits. Bowel sounds: normal  RECTAL: N/A GU: N/A  EXTREMETIES: nonedematous.  MUSCULOSKELETAL:  Spine: normal Joints: reduced ROM and crepitus both knees  NEUROLOGIC: oriented to time,place and person; nonfocal. Strength is normal Sensory is normal Reflexes are normal Cranial Nerves are normal.  ASSESSMENT:  Frequency - Plan: POCT urinalysis dipstick, POCT UA - Microscopic Only  Hyperglycemia - Plan: POCT glycosylated hemoglobin (Hb A1C)  Hyperlipidemia - Plan: CMP14+EGFR, NMR, lipoprofile  HYPERTENSION - Plan: CMP14+EGFR  Hypothyroid - Plan: TSH  Obesity  OBSTRUCTIVE SLEEP APNEA  TIA (transient ischemic attack)  Amnesia  Aphasia  Migraine  Polio  Vitamin D deficiency  CHOLECYSTECTOMY, HX OF  Seasonal allergies  PLAN:       Dr Woodroe Mode Recommendations  For nutrition information, I recommend books:  1).Eat to Live by Dr Monico Hoar. 2).Prevent and Reverse Heart Disease by Dr Suzzette Righter. 3) Dr Katherina Right Book:  Program to  Reverse Diabetes  Exercise recommendations are:  If unable to walk, then the patient can exercise in a chair 3 times a day. By flapping arms like a bird gently and raising legs outwards to the front.  If ambulatory, the patient can go for walks for 30 minutes 3 times a week. Then increase the intensity and duration as tolerated.  Goal is to try to attain exercise frequency to 5 times a week.  If applicable: Best to perform resistance exercises (machines or weights) 2 days a week and cardio type exercises 3 days per week.   Counseled on the need for weight loss and diet and exercise. Orders Placed This Encounter  Procedures  . CMP14+EGFR  . NMR, lipoprofile  . TSH  . POCT urinalysis dipstick  . POCT UA - Microscopic Only  . POCT glycosylated hemoglobin (Hb A1C)   Meds ordered this encounter  Medications  . benzonatate (TESSALON) 200 MG capsule    Sig: Take 200 mg by mouth 3 (three) times daily as needed for cough.  . dextromethorphan (DELSYM) 30 MG/5ML liquid    Sig: Take by mouth as needed for cough.  . diphenoxylate-atropine (LOMOTIL) 2.5-0.025 MG per tablet    Sig: Take by mouth 4 (four) times daily as needed for diarrhea or loose stools.  . Cholecalciferol (VITAMIN D3) 2000 UNITS capsule    Sig: Take 2,000 Units by mouth daily.  Marland Kitchen levothyroxine (SYNTHROID, LEVOTHROID) 150 MCG tablet    Sig: Take 150 mcg by mouth daily. Monday and Friday   Medications Discontinued During This Encounter  Medication Reason  . azithromycin (ZITHROMAX) 250 MG tablet Completed Course  . levothyroxine (SYNTHROID, LEVOTHROID) 150 MCG tablet    Return in about 4 weeks (around 05/08/2013) for recheck BP.  Criss Pallone P. Modesto Charon, M.D.

## 2013-04-12 LAB — NMR, LIPOPROFILE
Cholesterol: 112 mg/dL (ref <200)
HDL Cholesterol by NMR: 39 mg/dL — ABNORMAL LOW (ref >=40)
HDL Particle Number: 34 umol/L (ref >=30.5)
LDL Particle Number: 549 nmol/L (ref <1000)
LDL Size: 21.2 nm (ref >20.5)
LDLC SERPL CALC-MCNC: 37 mg/dL (ref <100)
LP-IR Score: 75 — ABNORMAL HIGH (ref <=45)
Small LDL Particle Number: 258 nmol/L (ref <=527)
Triglycerides by NMR: 181 mg/dL — ABNORMAL HIGH (ref <150)

## 2013-04-12 LAB — CMP14+EGFR
ALT: 53 IU/L — ABNORMAL HIGH (ref 0–32)
AST: 30 IU/L (ref 0–40)
Albumin/Globulin Ratio: 1.6 (ref 1.1–2.5)
Albumin: 4.4 g/dL (ref 3.5–4.8)
Alkaline Phosphatase: 97 IU/L (ref 39–117)
BUN/Creatinine Ratio: 21 (ref 11–26)
BUN: 15 mg/dL (ref 8–27)
CO2: 25 mmol/L (ref 18–29)
Calcium: 10.7 mg/dL — ABNORMAL HIGH (ref 8.6–10.2)
Chloride: 102 mmol/L (ref 97–108)
Creatinine, Ser: 0.73 mg/dL (ref 0.57–1.00)
GFR calc Af Amer: 96 mL/min/{1.73_m2} (ref >59)
GFR calc non Af Amer: 83 mL/min/{1.73_m2} (ref >59)
Globulin, Total: 2.7 g/dL (ref 1.5–4.5)
Glucose: 107 mg/dL — ABNORMAL HIGH (ref 65–99)
Potassium: 4.3 mmol/L (ref 3.5–5.2)
Sodium: 142 mmol/L (ref 134–144)
Total Bilirubin: 0.4 mg/dL (ref 0.0–1.2)
Total Protein: 7.1 g/dL (ref 6.0–8.5)

## 2013-04-12 LAB — TSH: TSH: 2.99 u[IU]/mL (ref 0.450–4.500)

## 2013-04-13 ENCOUNTER — Other Ambulatory Visit: Payer: Self-pay | Admitting: Family Medicine

## 2013-04-13 DIAGNOSIS — N390 Urinary tract infection, site not specified: Secondary | ICD-10-CM

## 2013-04-13 MED ORDER — CIPROFLOXACIN HCL 500 MG PO TABS: 500.0000 mg | ORAL_TABLET | Freq: Two times a day (BID) | ORAL | Status: DC

## 2013-04-13 NOTE — Progress Notes (Signed)
Quick Note:  Call Patient  Labs abnormal: Prediabetic Calcium persistently elevated UTI LDLc is too low and the triglyceride is too high  Recommendations: Needs to be on an antibiotic for UTI.ordered in Epic Needs additional labs to evaluate the elevated calcium.ordered in EPIC. Needs to be on a plant based Diet with low glycemic foods to lower the triglycerides and avoid Diabetes. Also, this will rebalance the triglycerides and the LDLc. Keep the follow up in 4 weeks.     ______

## 2013-04-30 ENCOUNTER — Telehealth: Payer: Self-pay | Admitting: *Deleted

## 2013-05-13 ENCOUNTER — Other Ambulatory Visit: Payer: Self-pay | Admitting: Family Medicine

## 2013-05-14 ENCOUNTER — Encounter: Payer: Self-pay | Admitting: Family Medicine

## 2013-05-14 ENCOUNTER — Ambulatory Visit (INDEPENDENT_AMBULATORY_CARE_PROVIDER_SITE_OTHER): Payer: Medicare Other | Admitting: Family Medicine

## 2013-05-14 VITALS — BP 147/82 | HR 84 | Temp 97.3°F | Ht 64.5 in | Wt 242.6 lb

## 2013-05-14 DIAGNOSIS — J302 Other seasonal allergic rhinitis: Secondary | ICD-10-CM

## 2013-05-14 DIAGNOSIS — R740 Nonspecific elevation of levels of transaminase and lactic acid dehydrogenase [LDH]: Secondary | ICD-10-CM

## 2013-05-14 DIAGNOSIS — R7309 Other abnormal glucose: Secondary | ICD-10-CM

## 2013-05-14 DIAGNOSIS — I10 Essential (primary) hypertension: Secondary | ICD-10-CM

## 2013-05-14 DIAGNOSIS — G43909 Migraine, unspecified, not intractable, without status migrainosus: Secondary | ICD-10-CM

## 2013-05-14 DIAGNOSIS — J309 Allergic rhinitis, unspecified: Secondary | ICD-10-CM

## 2013-05-14 DIAGNOSIS — G4733 Obstructive sleep apnea (adult) (pediatric): Secondary | ICD-10-CM

## 2013-05-14 DIAGNOSIS — R739 Hyperglycemia, unspecified: Secondary | ICD-10-CM

## 2013-05-14 DIAGNOSIS — N951 Menopausal and female climacteric states: Secondary | ICD-10-CM

## 2013-05-14 DIAGNOSIS — R232 Flushing: Secondary | ICD-10-CM | POA: Insufficient documentation

## 2013-05-14 DIAGNOSIS — Z9089 Acquired absence of other organs: Secondary | ICD-10-CM

## 2013-05-14 DIAGNOSIS — R7402 Elevation of levels of lactic acid dehydrogenase (LDH): Secondary | ICD-10-CM

## 2013-05-14 DIAGNOSIS — E785 Hyperlipidemia, unspecified: Secondary | ICD-10-CM

## 2013-05-14 DIAGNOSIS — E039 Hypothyroidism, unspecified: Secondary | ICD-10-CM

## 2013-05-14 DIAGNOSIS — R748 Abnormal levels of other serum enzymes: Secondary | ICD-10-CM | POA: Insufficient documentation

## 2013-05-14 DIAGNOSIS — R7401 Elevation of levels of liver transaminase levels: Secondary | ICD-10-CM

## 2013-05-14 DIAGNOSIS — A809 Acute poliomyelitis, unspecified: Secondary | ICD-10-CM

## 2013-05-14 DIAGNOSIS — E669 Obesity, unspecified: Secondary | ICD-10-CM

## 2013-05-14 DIAGNOSIS — G459 Transient cerebral ischemic attack, unspecified: Secondary | ICD-10-CM

## 2013-05-14 DIAGNOSIS — E559 Vitamin D deficiency, unspecified: Secondary | ICD-10-CM

## 2013-05-14 MED ORDER — METOPROLOL TARTRATE 25 MG PO TABS: 50.0000 mg | ORAL_TABLET | Freq: Two times a day (BID) | ORAL | Status: DC

## 2013-05-14 NOTE — Progress Notes (Signed)
Patient ID: Karen Potter, female   DOB: Sep 30, 1940, 73 y.o.   MRN: [id number] SUBJECTIVE: CC: Chief Complaint  Patient presents with  . Follow-up    concerned about LFT rising and her calcium level  . Medication Refill    refill all meds except thyroid 132mg  would like 90 day supply    HPI: Her for follow up of Calcium. Also, she chronically has a mildly elevated ALT. Also obese  Patient is here for follow up of hypertension: denies Headache;deniesChest Pain;denies weakness;denies Shortness of Breath or Orthopnea;denies Visual changes;denies palpitations;denies cough;denies pedal edema;denies symptoms of TIA or stroke; BPs has been [id number] 1833Psystolic admits to Compliance with medications. denies Problems with medications.  Has hot flushes and the bP goes up. She feels like rosacea flares with the BP elevation.   Past Medical History  Diagnosis Date  . Amnesia   . Polio   . Migraine   . Hyperglycemia   . Hyperlipidemia   . Hypertension   . Hypothyroid   . Seasonal allergies   . Vitamin D deficiency   . Obesity    Past Surgical History  Procedure Laterality Date  . Abdominal hysterectomy    . Cholecystectomy    . Carpal tunnel release    . Back surgery    . Knee surgery      right   History   Social History  . Marital Status: Married    Spouse Name: N/A    Number of Children: N/A  . Years of Education: N/A   Occupational History  . Not on file.   Social History Main Topics  . Smoking status: Never Smoker   . Smokeless tobacco: Not on file  . Alcohol Use: No  . Drug Use: No  . Sexual Activity: Not on file   Other Topics Concern  . Not on file   Social History Narrative  . No narrative on file   Family History  Problem Relation Age of Onset  . Heart failure Mother   . Cancer Father   . Cancer Brother    Current Outpatient Prescriptions on File Prior to Visit  Medication Sig Dispense Refill  . aspirin 325 MG tablet Take 325 mg by mouth  daily.      .Sarajane MarekSodium 30-100 MG CAPS Take by mouth.      . Cholecalciferol (VITAMIN D3) 2000 UNITS capsule Take 2,000 Units by mouth daily.      . clobetasol ointment (TEMOVATE) 08.25% Apply 1 application topically 2 (two) times daily.      . clotrimazole-betamethasone (LOTRISONE) cream Apply 1 application topically 2 (two) times daily.      . Docosahexaenoic Acid (DHA COMPLETE PO) Take 1 tablet by mouth daily.      .Marland Kitchendocusate sodium (COLACE) 100 MG capsule Take 100 mg by mouth 2 (two) times daily.      . fenofibrate (TRICOR) 48 MG tablet TAKE ONE TABLET BY MOUTH ONCE DAILY  30 tablet  4  . levothyroxine (SYNTHROID, LEVOTHROID) 150 MCG tablet Take 150 mcg by mouth daily. Monday and Friday      . levothyroxine (SYNTHROID, LEVOTHROID) 175 MCG tablet Take 175 mcg by mouth every other day. 5 days a week.      . losartan (COZAAR) 100 MG tablet TAKE ONE TABLET BY MOUTH EVERY DAY  90 tablet  3  . Magnesium Chloride (SLOW-MAG PO) Take 1 tablet by mouth.      . Multiple Vitamin (MULTIVITAMIN WITH  MINERALS) TABS Take 1 tablet by mouth daily. Women's One A Day      . omeprazole (PRILOSEC) 20 MG capsule Take 20 mg by mouth daily.      . benzonatate (TESSALON) 200 MG capsule Take 200 mg by mouth 3 (three) times daily as needed for cough.      . ciprofloxacin (CIPRO) 500 MG tablet Take 1 tablet (500 mg total) by mouth 2 (two) times daily.  20 tablet  0  . dextromethorphan (DELSYM) 30 MG/5ML liquid Take by mouth as needed for cough.      . diphenoxylate-atropine (LOMOTIL) 2.5-0.025 MG per tablet Take by mouth 4 (four) times daily as needed for diarrhea or loose stools.      Marland Kitchen guaiFENesin (MUCINEX) 600 MG 12 hr tablet Take 1,200 mg by mouth.      . VENTOLIN HFA 108 (90 BASE) MCG/ACT inhaler        No current facility-administered medications on file prior to visit.   Allergies  Allergen Reactions  . Codeine Itching  . Other Itching    Most pain meds cause itching.  When has to take  pain medication, she has been instructed to take Benadryl  . Doxycycline Rash   Immunization History  Administered Date(s) Administered  . Influenza, High Dose Seasonal PF 03/20/2013  . Pneumococcal Polysaccharide-23 02/07/2009  . Tdap 05/10/2010  . Zoster 05/10/2010   Prior to Admission medications   Medication Sig Start Date End Date Taking? Authorizing Provider  aspirin 325 MG tablet Take 325 mg by mouth daily.    Historical Provider, MD  benzonatate (TESSALON) 200 MG capsule Take 200 mg by mouth 3 (three) times daily as needed for cough.    Historical Provider, MD  Casanthranol-Docusate Sodium 30-100 MG CAPS Take by mouth.    Historical Provider, MD  Cholecalciferol (VITAMIN D3) 2000 UNITS capsule Take 2,000 Units by mouth daily.    Historical Provider, MD  ciprofloxacin (CIPRO) 500 MG tablet Take 1 tablet (500 mg total) by mouth 2 (two) times daily. 04/13/13   Vernie Shanks, MD  clobetasol ointment (TEMOVATE) 7.93 % Apply 1 application topically 2 (two) times daily.    Historical Provider, MD  clotrimazole-betamethasone (LOTRISONE) cream Apply 1 application topically 2 (two) times daily.    Historical Provider, MD  dextromethorphan (DELSYM) 30 MG/5ML liquid Take by mouth as needed for cough.    Historical Provider, MD  diphenoxylate-atropine (LOMOTIL) 2.5-0.025 MG per tablet Take by mouth 4 (four) times daily as needed for diarrhea or loose stools.    Historical Provider, MD  Docosahexaenoic Acid (DHA COMPLETE PO) Take 1 tablet by mouth daily.    Historical Provider, MD  docusate sodium (COLACE) 100 MG capsule Take 100 mg by mouth 2 (two) times daily.    Historical Provider, MD  fenofibrate (TRICOR) 48 MG tablet TAKE ONE TABLET BY MOUTH ONCE DAILY 01/10/13   Vernie Shanks, MD  guaiFENesin (MUCINEX) 600 MG 12 hr tablet Take 1,200 mg by mouth.    Historical Provider, MD  levothyroxine (SYNTHROID, LEVOTHROID) 150 MCG tablet Take 150 mcg by mouth daily. Monday and Friday 12/21/12   Lysbeth Penner, FNP  levothyroxine (SYNTHROID, LEVOTHROID) 175 MCG tablet Take 175 mcg by mouth every other day. 5 days a week.    Historical Provider, MD  losartan (COZAAR) 100 MG tablet TAKE ONE TABLET BY MOUTH EVERY DAY 01/04/13   Vernie Shanks, MD  Magnesium Chloride (SLOW-MAG PO) Take 1 tablet by mouth.  Historical Provider, MD  Multiple Vitamin (MULTIVITAMIN WITH MINERALS) TABS Take 1 tablet by mouth daily. Women's One A Day    Historical Provider, MD  omeprazole (PRILOSEC) 20 MG capsule Take 20 mg by mouth daily.    Historical Provider, MD  VENTOLIN HFA 108 (90 BASE) MCG/ACT inhaler  10/23/12   Historical Provider, MD     ROS: As above in the HPI. All other systems are stable or negative.  OBJECTIVE: APPEARANCE:  Patient in no acute distress.The patient appeared well nourished and normally developed. Acyanotic. Waist: VITAL SIGNS:BP 147/82  Pulse 84  Temp(Src) 97.3 F (36.3 C) (Oral)  Ht 5' 4.5" (1.638 m)  Wt 242 lb 9.6 oz (110.043 kg)  BMI 41.01 kg/m2  Morbidly obese WF SKIN: warm and  Dry without overt rashes, tattoos and scars  HEAD and Neck: without JVD, Head and scalp: normal Eyes:No scleral icterus. Fundi normal, eye movements normal. Ears: Auricle normal, canal normal, Tympanic membranes normal, insufflation normal. Nose: normal Throat: normal Neck & thyroid: normal  CHEST & LUNGS: Chest wall: normal Lungs: Clear  CVS: Reveals the PMI to be normally located. Regular rhythm, First and Second Heart sounds are normal,  absence of murmurs, rubs or gallops. Peripheral vasculature: Radial pulses: normal Dorsal pedis pulses: normal Posterior pulses: normal  ABDOMEN:  Appearance: Obese Benign, no organomegaly, no masses, no Abdominal Aortic enlargement. No Guarding , no rebound. No Bruits. Bowel sounds: normal  RECTAL: N/A GU: N/A  EXTREMETIES: nonedematous.  MUSCULOSKELETAL:  Spine: normal Joints: intact  NEUROLOGIC: oriented to time,place and person;  no changes Results for orders placed in visit on 04/10/13  CMP14+EGFR      Result Value Range   Glucose 107 (*) 65 - 99 mg/dL   BUN 15  8 - 27 mg/dL   Creatinine, Ser 0.73  0.57 - 1.00 mg/dL   GFR calc non Af Amer 83  >59 mL/min/1.73   GFR calc Af Amer 96  >59 mL/min/1.73   BUN/Creatinine Ratio 21  11 - 26   Sodium 142  134 - 144 mmol/L   Potassium 4.3  3.5 - 5.2 mmol/L   Chloride 102  97 - 108 mmol/L   CO2 25  18 - 29 mmol/L   Calcium 10.7 (*) 8.6 - 10.2 mg/dL   Total Protein 7.1  6.0 - 8.5 g/dL   Albumin 4.4  3.5 - 4.8 g/dL   Globulin, Total 2.7  1.5 - 4.5 g/dL   Albumin/Globulin Ratio 1.6  1.1 - 2.5   Total Bilirubin 0.4  0.0 - 1.2 mg/dL   Alkaline Phosphatase 97  39 - 117 IU/L   AST 30  0 - 40 IU/L   ALT 53 (*) 0 - 32 IU/L  NMR, LIPOPROFILE      Result Value Range   LDL Particle Number 549  <1000 nmol/L   LDLC SERPL CALC-MCNC 37  <100 mg/dL   HDL Cholesterol by NMR 39 (*) >=40 mg/dL   Triglycerides by NMR 181 (*) <150 mg/dL   Cholesterol 112  <200 mg/dL   HDL Particle Number 34.0  >=30.5 umol/L   Small LDL Particle Number 258  <=527 nmol/L   LDL Size 21.2  >20.5 nm   LP-IR Score 75 (*) <=45  TSH      Result Value Range   TSH 2.990  0.450 - 4.500 uIU/mL  POCT URINALYSIS DIPSTICK      Result Value Range   Color, UA yellow     Clarity, UA cloudy  Glucose, UA neg     Bilirubin, UA neg     Ketones, UA neg     Spec Grav, UA 1.010     Blood, UA mod     pH, UA 6.5     Protein, UA neg     Urobilinogen, UA negative     Nitrite, UA pos     Leukocytes, UA moderate (2+)    POCT UA - MICROSCOPIC ONLY      Result Value Range   WBC, Ur, HPF, POC 56-10 w/ clues     RBC, urine, microscopic 1-5     Bacteria, U Microscopic mod     Mucus, UA neg     Epithelial cells, urine per micros mod     Crystals, Ur, HPF, POC neg     Casts, Ur, LPF, POC neg     Yeast, UA neg    POCT GLYCOSYLATED HEMOGLOBIN (HGB A1C)      Result Value Range   Hemoglobin A1C 5.7        ASSESSMENT:   Hyperglycemia  Hyperlipidemia  HYPERTENSION - Plan: Metanephrines, Pheochromocyt, metoprolol (LOPRESSOR) 25 MG tablet, US Abdomen Complete  Hypothyroid  Obesity  OBSTRUCTIVE SLEEP APNEA  TIA (transient ischemic attack)  Migraine  Polio  Vitamin D deficiency  CHOLECYSTECTOMY, HX OF  Seasonal allergies  Abnormal transaminases - Plan: Hepatitis panel, acute, ANA, Ferritin, Alpha-1-Antitrypsin, Ceruloplasmin, US Abdomen Complete  Hypercalcemia - Plan: US Abdomen Complete  Hot flashes - Plan: Metanephrines, Pheochromocyt, US Abdomen Complete  PLAN:      Dr Paula Libra Recommendations  For nutrition information, I recommend books:  1).Eat to Live by Dr Excell Seltzer. 2).Prevent and Reverse Heart Disease by Dr Karl Luke. 3) Dr Janene Harvey Book:  Program to Reverse Diabetes  Exercise recommendations are:  If unable to walk, then the patient can exercise in a chair 3 times a day. By flapping arms like a bird gently and raising legs outwards to the front.  If ambulatory, the patient can go for walks for 30 minutes 3 times a week. Then increase the intensity and duration as tolerated.  Goal is to try to attain exercise frequency to 5 times a week.  If applicable: Best to perform resistance exercises (machines or weights) 2 days a week and cardio type exercises 3 days per week.  Orders Placed This Encounter  Procedures  . US Abdomen Complete    Standing Status: Future     Number of Occurrences:      Standing Expiration Date: 07/13/2014    Order Specific Question:  Reason for Exam (SYMPTOM  OR DIAGNOSIS REQUIRED)    Answer:  hot flashes with elevated BP, elevated transaminases,    Order Specific Question:  Preferred imaging location?    Answer:  [facility]  . Hepatitis panel, acute  . ANA  . Ferritin  . Alpha-1-Antitrypsin  . Ceruloplasmin  . Metanephrines, Pheochromocyt   Meds ordered this encounter  Medications  .  metoprolol (LOPRESSOR) 25 MG tablet    Sig: Take 2 tablets (50 mg total) by mouth 2 (two) times daily.    Dispense:  60 tablet    Refill:  3   There are no discontinued medications. Return in about 2 weeks (around 05/28/2013) for Recheck medical problems.  Kyrstan Gotwalt P. Jacelyn Grip, M.D.

## 2013-05-14 NOTE — Patient Instructions (Signed)
      Dr Mikko Lewellen's Recommendations  For nutrition information, I recommend books:  1).Eat to Live by Dr Joel Fuhrman. 2).Prevent and Reverse Heart Disease by Dr Caldwell Esselstyn. 3) Dr Neal Barnard's Book:  Program to Reverse Diabetes  Exercise recommendations are:  If unable to walk, then the patient can exercise in a chair 3 times a day. By flapping arms like a bird gently and raising legs outwards to the front.  If ambulatory, the patient can go for walks for 30 minutes 3 times a week. Then increase the intensity and duration as tolerated.  Goal is to try to attain exercise frequency to 5 times a week.  If applicable: Best to perform resistance exercises (machines or weights) 2 days a week and cardio type exercises 3 days per week.  

## 2013-05-15 ENCOUNTER — Other Ambulatory Visit: Payer: Medicare Other

## 2013-05-15 LAB — HEPATITIS PANEL, ACUTE
Hep A IgM: NEGATIVE
Hep B C IgM: NEGATIVE
Hep C Virus Ab: <0.1 s/co ratio (ref 0.0–0.9)
Hepatitis B Surface Ag: NEGATIVE

## 2013-05-15 LAB — FERRITIN: Ferritin: 51 ng/mL (ref 15–150)

## 2013-05-15 LAB — ANA: Anti Nuclear Antibody(ANA): NEGATIVE

## 2013-05-15 LAB — ALPHA-1-ANTITRYPSIN: A-1 Antitrypsin: 148 mg/dL (ref 90–200)

## 2013-05-15 LAB — CERULOPLASMIN: Ceruloplasmin: 22.4 mg/dL (ref 16.0–45.0)

## 2013-05-15 NOTE — Progress Notes (Signed)
PATIENT CAME IN FOR LABS ONLY 

## 2013-05-16 ENCOUNTER — Other Ambulatory Visit: Payer: Self-pay | Admitting: Family Medicine

## 2013-05-16 DIAGNOSIS — I10 Essential (primary) hypertension: Secondary | ICD-10-CM

## 2013-05-16 MED ORDER — AMLODIPINE BESYLATE 2.5 MG PO TABS: 2.5000 mg | ORAL_TABLET | Freq: Every day | ORAL | Status: DC

## 2013-05-17 ENCOUNTER — Telehealth: Payer: Self-pay | Admitting: Family Medicine

## 2013-05-17 LAB — METANEPHRINES, PHEOCHROMOCYT
Creatinine, Random U: 152.6 mg/dL (ref 15.0–278.0)
Metaneph Total, Ur: 60 ug/L
Metaneph/Creat Ratio: 0.3 UG/MG Creat (ref 0.0–1.0)
Normetanephrine, Ur: 363 ug/L

## 2013-05-17 NOTE — Telephone Encounter (Signed)
PATIENTS LABS NOT REVIEWED BY PROVIDER YET WILL CALL BACK WHEN READY

## 2013-05-19 ENCOUNTER — Encounter: Payer: Self-pay | Admitting: Family Medicine

## 2013-05-19 NOTE — Progress Notes (Signed)
Quick Note:  Call patient. Special Labs for the liver enzyme elevation were normal. No change in plans. ______

## 2013-05-21 NOTE — Progress Notes (Signed)
Quick Note:  Call patient. Labs normal.no evidence of an adrenaline tumor. No change in plan. ______

## 2013-05-22 ENCOUNTER — Ambulatory Visit (HOSPITAL_COMMUNITY)
Admission: RE | Admit: 2013-05-22 | Discharge: 2013-05-22 | Disposition: A | Discharge status: Home / Self Care | Payer: Medicare Other | Source: Ambulatory Visit | Attending: Family Medicine | Admitting: Family Medicine

## 2013-05-22 DIAGNOSIS — N281 Cyst of kidney, acquired: Secondary | ICD-10-CM | POA: Insufficient documentation

## 2013-05-22 DIAGNOSIS — R7402 Elevation of levels of lactic acid dehydrogenase (LDH): Secondary | ICD-10-CM | POA: Insufficient documentation

## 2013-05-22 DIAGNOSIS — R740 Nonspecific elevation of levels of transaminase and lactic acid dehydrogenase [LDH]: Principal | ICD-10-CM

## 2013-05-22 DIAGNOSIS — R748 Abnormal levels of other serum enzymes: Secondary | ICD-10-CM

## 2013-05-22 DIAGNOSIS — Z9089 Acquired absence of other organs: Secondary | ICD-10-CM | POA: Insufficient documentation

## 2013-05-22 DIAGNOSIS — I10 Essential (primary) hypertension: Secondary | ICD-10-CM

## 2013-05-22 DIAGNOSIS — R7401 Elevation of levels of liver transaminase levels: Secondary | ICD-10-CM | POA: Insufficient documentation

## 2013-05-22 DIAGNOSIS — R232 Flushing: Secondary | ICD-10-CM

## 2013-05-25 ENCOUNTER — Other Ambulatory Visit: Payer: Self-pay | Admitting: Family Medicine

## 2013-05-25 DIAGNOSIS — R9389 Abnormal findings on diagnostic imaging of other specified body structures: Secondary | ICD-10-CM

## 2013-06-01 ENCOUNTER — Ambulatory Visit (INDEPENDENT_AMBULATORY_CARE_PROVIDER_SITE_OTHER): Payer: Medicare Other | Admitting: Family Medicine

## 2013-06-01 ENCOUNTER — Encounter: Payer: Self-pay | Admitting: Family Medicine

## 2013-06-01 VITALS — BP 162/87 | HR 75 | Temp 97.1°F | Ht 64.0 in | Wt 247.6 lb

## 2013-06-01 DIAGNOSIS — K7689 Other specified diseases of liver: Secondary | ICD-10-CM

## 2013-06-01 DIAGNOSIS — IMO0001 Reserved for inherently not codable concepts without codable children: Secondary | ICD-10-CM

## 2013-06-01 DIAGNOSIS — E039 Hypothyroidism, unspecified: Secondary | ICD-10-CM

## 2013-06-01 DIAGNOSIS — G4733 Obstructive sleep apnea (adult) (pediatric): Secondary | ICD-10-CM

## 2013-06-01 DIAGNOSIS — K760 Fatty (change of) liver, not elsewhere classified: Secondary | ICD-10-CM | POA: Insufficient documentation

## 2013-06-01 DIAGNOSIS — E669 Obesity, unspecified: Secondary | ICD-10-CM

## 2013-06-01 DIAGNOSIS — E559 Vitamin D deficiency, unspecified: Secondary | ICD-10-CM

## 2013-06-01 DIAGNOSIS — R7309 Other abnormal glucose: Secondary | ICD-10-CM

## 2013-06-01 DIAGNOSIS — G459 Transient cerebral ischemic attack, unspecified: Secondary | ICD-10-CM

## 2013-06-01 DIAGNOSIS — R350 Frequency of micturition: Secondary | ICD-10-CM

## 2013-06-01 DIAGNOSIS — R739 Hyperglycemia, unspecified: Secondary | ICD-10-CM

## 2013-06-01 DIAGNOSIS — E785 Hyperlipidemia, unspecified: Secondary | ICD-10-CM

## 2013-06-01 DIAGNOSIS — I10 Essential (primary) hypertension: Secondary | ICD-10-CM

## 2013-06-01 LAB — POCT UA - MICROSCOPIC ONLY
Bacteria, U Microscopic: NEGATIVE
Casts, Ur, LPF, POC: NEGATIVE
Crystals, Ur, HPF, POC: NEGATIVE
Mucus, UA: NEGATIVE
RBC, urine, microscopic: NEGATIVE
Yeast, UA: NEGATIVE

## 2013-06-01 LAB — POCT URINALYSIS DIPSTICK
Bilirubin, UA: NEGATIVE
Blood, UA: NEGATIVE
Glucose, UA: NEGATIVE
Ketones, UA: NEGATIVE
Nitrite, UA: NEGATIVE
Protein, UA: NEGATIVE
Spec Grav, UA: 1.01
Urobilinogen, UA: NEGATIVE
pH, UA: 6

## 2013-06-01 NOTE — Progress Notes (Signed)
Patient ID: Karen Potter, female   DOB: [birth year], 72 y.o.   MRN: [id number] SUBJECTIVE: CC: Chief Complaint  Patient presents with  . Follow-up    2 week follow up     HPI: 1) abnormal U/S:wants to discuss this, fatty liver and dilated bilary duct. Has an appointment scheduled with Dr Watt Climes. 2) hypercalcemia: did not get the PTH done. Will need to do today 3) obesity: has not changed her diet to a plant based  Diet. Does not exercise 4) follow up on UTI .  Past Medical History  Diagnosis Date  . Amnesia   . Polio   . Migraine   . Hyperglycemia   . Hyperlipidemia   . Hypertension   . Hypothyroid   . Seasonal allergies   . Vitamin D deficiency   . Obesity   . Fatty liver    Past Surgical History  Procedure Laterality Date  . Abdominal hysterectomy    . Cholecystectomy    . Carpal tunnel release    . Back surgery    . Knee surgery      right   History   Social History  . Marital Status: Married    Spouse Name: N/A    Number of Children: N/A  . Years of Education: N/A   Occupational History  . Not on file.   Social History Main Topics  . Smoking status: Never Smoker   . Smokeless tobacco: Not on file  . Alcohol Use: No  . Drug Use: No  . Sexual Activity: Not on file   Other Topics Concern  . Not on file   Social History Narrative  . No narrative on file   Family History  Problem Relation Age of Onset  . Heart failure Mother   . Cancer Father   . Cancer Brother    Current Outpatient Prescriptions on File Prior to Visit  Medication Sig Dispense Refill  . aspirin 325 MG tablet Take 325 mg by mouth daily.      Sarajane Marek Sodium 30-100 MG CAPS Take by mouth.      . Cholecalciferol (VITAMIN D3) 2000 UNITS capsule Take 2,000 Units by mouth daily.      . clobetasol ointment (TEMOVATE) [id number] % Apply 1 application topically 2 (two) times daily.      . clotrimazole-betamethasone (LOTRISONE) cream Apply 1 application topically 2 (two) times  daily.      . diphenoxylate-atropine (LOMOTIL) 2.5-0.025 MG per tablet Take by mouth 4 (four) times daily as needed for diarrhea or loose stools.      . Docosahexaenoic Acid (DHA COMPLETE PO) Take 1 tablet by mouth daily.      Marland Kitchen docusate sodium (COLACE) 100 MG capsule Take 100 mg by mouth 2 (two) times daily.      . fenofibrate (TRICOR) 48 MG tablet TAKE ONE TABLET BY MOUTH ONCE DAILY  30 tablet  4  . guaiFENesin (MUCINEX) 600 MG 12 hr tablet Take 1,200 mg by mouth.      . levothyroxine (SYNTHROID, LEVOTHROID) 150 MCG tablet Take 150 mcg by mouth daily. Monday and Friday      . levothyroxine (SYNTHROID, LEVOTHROID) 175 MCG tablet TAKE ONE TABLET BY MOUTH EVERY DAY  30 tablet  10  . losartan (COZAAR) 100 MG tablet TAKE ONE TABLET BY MOUTH EVERY DAY  90 tablet  3  . Magnesium Chloride (SLOW-MAG PO) Take 1 tablet by mouth.      . Multiple Vitamin (MULTIVITAMIN WITH MINERALS) TABS Take  1 tablet by mouth daily. Women's One A Day      . omeprazole (PRILOSEC) 20 MG capsule Take 20 mg by mouth daily.      . VENTOLIN HFA 108 (90 BASE) MCG/ACT inhaler       . benzonatate (TESSALON) 200 MG capsule Take 200 mg by mouth 3 (three) times daily as needed for cough.      . ciprofloxacin (CIPRO) 500 MG tablet Take 1 tablet (500 mg total) by mouth 2 (two) times daily.  20 tablet  0  . dextromethorphan (DELSYM) 30 MG/5ML liquid Take by mouth as needed for cough.       No current facility-administered medications on file prior to visit.   Allergies  Allergen Reactions  . Codeine Itching  . Other Itching    Most pain meds cause itching.  When has to take pain medication, she has been instructed to take Benadryl  . Doxycycline Rash   Immunization History  Administered Date(s) Administered  . Influenza, High Dose Seasonal PF 03/20/2013  . Pneumococcal Polysaccharide-23 02/07/2009  . Tdap 05/10/2010  . Zoster 05/10/2010   Prior to Admission medications   Medication Sig Start Date End Date Taking?  Authorizing Provider  amLODipine (NORVASC) 2.5 MG tablet Take 1 tablet (2.5 mg total) by mouth daily. 05/16/13  Yes Vernie Shanks, MD  aspirin 325 MG tablet Take 325 mg by mouth daily.   Yes Historical Provider, MD  Casanthranol-Docusate Sodium 30-100 MG CAPS Take by mouth.   Yes Historical Provider, MD  Cholecalciferol (VITAMIN D3) 2000 UNITS capsule Take 2,000 Units by mouth daily.   Yes Historical Provider, MD  clobetasol ointment (TEMOVATE) 1.61 % Apply 1 application topically 2 (two) times daily.   Yes Historical Provider, MD  clotrimazole-betamethasone (LOTRISONE) cream Apply 1 application topically 2 (two) times daily.   Yes Historical Provider, MD  diphenoxylate-atropine (LOMOTIL) 2.5-0.025 MG per tablet Take by mouth 4 (four) times daily as needed for diarrhea or loose stools.   Yes Historical Provider, MD  Docosahexaenoic Acid (DHA COMPLETE PO) Take 1 tablet by mouth daily.   Yes Historical Provider, MD  docusate sodium (COLACE) 100 MG capsule Take 100 mg by mouth 2 (two) times daily.   Yes Historical Provider, MD  fenofibrate (TRICOR) 48 MG tablet TAKE ONE TABLET BY MOUTH ONCE DAILY 01/10/13  Yes Vernie Shanks, MD  guaiFENesin (MUCINEX) 600 MG 12 hr tablet Take 1,200 mg by mouth.   Yes Historical Provider, MD  levothyroxine (SYNTHROID, LEVOTHROID) 150 MCG tablet Take 150 mcg by mouth daily. Monday and Friday 12/21/12  Yes Lysbeth Penner, FNP  levothyroxine (SYNTHROID, LEVOTHROID) 175 MCG tablet TAKE ONE TABLET BY MOUTH EVERY DAY 05/13/13  Yes Vernie Shanks, MD  losartan (COZAAR) 100 MG tablet TAKE ONE TABLET BY MOUTH EVERY DAY 01/04/13  Yes Vernie Shanks, MD  Magnesium Chloride (SLOW-MAG PO) Take 1 tablet by mouth.   Yes Historical Provider, MD  metoprolol tartrate (LOPRESSOR) 25 MG tablet  05/14/13  Yes Historical Provider, MD  Multiple Vitamin (MULTIVITAMIN WITH MINERALS) TABS Take 1 tablet by mouth daily. Women's One A Day   Yes Historical Provider, MD  omeprazole (PRILOSEC) 20 MG capsule  Take 20 mg by mouth daily.   Yes Historical Provider, MD  VENTOLIN HFA 108 (90 BASE) MCG/ACT inhaler  10/23/12  Yes Historical Provider, MD  benzonatate (TESSALON) 200 MG capsule Take 200 mg by mouth 3 (three) times daily as needed for cough.    Historical Provider,  MD  ciprofloxacin (CIPRO) 500 MG tablet Take 1 tablet (500 mg total) by mouth 2 (two) times daily. 04/13/13   Vernie Shanks, MD  dextromethorphan (DELSYM) 30 MG/5ML liquid Take by mouth as needed for cough.    Historical Provider, MD     ROS: As above in the HPI. All other systems are stable or negative.  OBJECTIVE: APPEARANCE:  Patient in no acute distress.The patient appeared well nourished and normally developed. Acyanotic. Waist: VITAL SIGNS:BP 162/87  Pulse 75  Temp(Src) 97.1 F (36.2 C) (Oral)  Ht 5\' 4"  (1.626 m)  Wt 247 lb 9.6 oz (112.311 kg)  BMI 42.48 kg/m2 Morbidly obese WF   SKIN: warm and  Dry without overt rashes, tattoos and scars  HEAD and Neck: without JVD, Head and scalp: normal Eyes:No scleral icterus. Fundi normal, eye movements normal. Ears: Auricle normal, canal normal, Tympanic membranes normal, insufflation normal. Nose: normal Throat: normal Neck & thyroid: normal  CHEST & LUNGS: Chest wall: normal Lungs: Clear  CVS: Reveals the PMI to be normally located. Regular rhythm, First and Second Heart sounds are normal,  absence of murmurs, rubs or gallops. Peripheral vasculature: Radial pulses: normal Dorsal pedis pulses: normal Posterior pulses: normal  ABDOMEN:  Appearance: obese Benign, no organomegaly, no masses, no Abdominal Aortic enlargement. No Guarding , no rebound. No Bruits. Bowel sounds: normal  RECTAL: N/A GU: N/A  EXTREMETIES: nonedematous.  MUSCULOSKELETAL:  Spine: normal Joints: intact  NEUROLOGIC: oriented to time,place and person; nonfocal.  Results for orders placed in visit on 06/01/13  POCT UA - MICROSCOPIC ONLY      Result Value Range   WBC, Ur,  HPF, POC 5-7     RBC, urine, microscopic neg     Bacteria, U Microscopic neg     Mucus, UA neg     Epithelial cells, urine per micros occ     Crystals, Ur, HPF, POC neg     Casts, Ur, LPF, POC neg     Yeast, UA neg    POCT URINALYSIS DIPSTICK      Result Value Range   Color, UA yellow     Clarity, UA clear     Glucose, UA neg     Bilirubin, UA neg     Ketones, UA neg     Spec Grav, UA 1.010     Blood, UA neg     pH, UA 6.0     Protein, UA neg     Urobilinogen, UA negative     Nitrite, UA neg     Leukocytes, UA Trace      ASSESSMENT: Frequency - Plan: POCT UA - Microscopic Only, POCT urinalysis dipstick, Urine culture  Fatty liver  HTN (hypertension) - Plan: amLODipine (NORVASC) 2.5 MG tablet  Hypercalcemia - Plan: PTH, Intact and Calcium, Calcium, ionized  TIA (transient ischemic attack)  OBSTRUCTIVE SLEEP APNEA  Obesity  Hypothyroid  HYPERTENSION  Hyperlipidemia  Hyperglycemia  Vitamin D deficiency  PLAN:      Dr Paula Libra Recommendations  For nutrition information, I recommend books:  1).Eat to Live by Dr Excell Seltzer. 2).Prevent and Reverse Heart Disease by Dr Karl Luke. 3) Dr Janene Harvey Book:  Program to Reverse Diabetes  Exercise recommendations are:  If unable to walk, then the patient can exercise in a chair 3 times a day. By flapping arms like a bird gently and raising legs outwards to the front.  If ambulatory, the patient can go for walks for 30 minutes 3  times a week. Then increase the intensity and duration as tolerated.  Goal is to try to attain exercise frequency to 5 times a week.  If applicable: Best to perform resistance exercises (machines or weights) 2 days a week and cardio type exercises 3 days per week.   counselled on LTC and  Diet and exercise.  Orders Placed This Encounter  Procedures  . Urine culture  . PTH, Intact and Calcium  . Calcium, ionized  . POCT UA - Microscopic Only  . POCT urinalysis  dipstick   Meds ordered this encounter  Medications  . metoprolol tartrate (LOPRESSOR) 25 MG tablet    Sig:   . amLODipine (NORVASC) 2.5 MG tablet    Sig: Take 2 tablets (5 mg total) by mouth daily.    Dispense:  30 tablet    Refill:  5   Medications Discontinued During This Encounter  Medication Reason  . amLODipine (NORVASC) 2.5 MG tablet Reorder   Return in about 2 weeks (around 06/15/2013) for recheck BP.  Ayeisha Lindenberger P. Jacelyn Grip, M.D.

## 2013-06-01 NOTE — Patient Instructions (Signed)
      Dr Chad Tiznado's Recommendations  For nutrition information, I recommend books:  1).Eat to Live by Dr Joel Fuhrman. 2).Prevent and Reverse Heart Disease by Dr Caldwell Esselstyn. 3) Dr Neal Barnard's Book:  Program to Reverse Diabetes  Exercise recommendations are:  If unable to walk, then the patient can exercise in a chair 3 times a day. By flapping arms like a bird gently and raising legs outwards to the front.  If ambulatory, the patient can go for walks for 30 minutes 3 times a week. Then increase the intensity and duration as tolerated.  Goal is to try to attain exercise frequency to 5 times a week.  If applicable: Best to perform resistance exercises (machines or weights) 2 days a week and cardio type exercises 3 days per week.  

## 2013-06-02 ENCOUNTER — Telehealth: Payer: Self-pay | Admitting: Family Medicine

## 2013-06-03 ENCOUNTER — Other Ambulatory Visit: Payer: Self-pay | Admitting: Family Medicine

## 2013-06-03 DIAGNOSIS — I10 Essential (primary) hypertension: Secondary | ICD-10-CM

## 2013-06-03 LAB — URINE CULTURE

## 2013-06-03 MED ORDER — AMLODIPINE BESYLATE 5 MG PO TABS: 5.0000 mg | ORAL_TABLET | Freq: Every day | ORAL | Status: DC

## 2013-06-05 LAB — PTH, INTACT AND CALCIUM
Calcium: 10.4 mg/dL — ABNORMAL HIGH (ref 8.7–10.3)
PTH: 38 pg/mL (ref 15–65)

## 2013-06-05 LAB — CALCIUM, IONIZED: Calcium, Ion: 5.8 mg/dL — ABNORMAL HIGH (ref 4.5–5.6)

## 2013-06-19 ENCOUNTER — Other Ambulatory Visit: Payer: Self-pay | Admitting: Family Medicine

## 2013-06-22 ENCOUNTER — Encounter: Payer: Self-pay | Admitting: Family Medicine

## 2013-06-22 ENCOUNTER — Ambulatory Visit (INDEPENDENT_AMBULATORY_CARE_PROVIDER_SITE_OTHER): Payer: Medicare Other | Admitting: Family Medicine

## 2013-06-22 VITALS — BP 147/88 | HR 89 | Temp 97.7°F | Ht 64.0 in | Wt 249.4 lb

## 2013-06-22 DIAGNOSIS — R7309 Other abnormal glucose: Secondary | ICD-10-CM

## 2013-06-22 DIAGNOSIS — E669 Obesity, unspecified: Secondary | ICD-10-CM

## 2013-06-22 DIAGNOSIS — E785 Hyperlipidemia, unspecified: Secondary | ICD-10-CM

## 2013-06-22 DIAGNOSIS — I10 Essential (primary) hypertension: Secondary | ICD-10-CM

## 2013-06-22 DIAGNOSIS — R739 Hyperglycemia, unspecified: Secondary | ICD-10-CM

## 2013-06-22 DIAGNOSIS — E559 Vitamin D deficiency, unspecified: Secondary | ICD-10-CM

## 2013-06-22 NOTE — Progress Notes (Signed)
Patient ID: Karen Potter, female   DOB: Apr 06, 1941, 73 y.o.   MRN: [id number] SUBJECTIVE: CC: Chief Complaint  Patient presents with  . Follow-up    2 WK FOLLOW UP BP  STATES MORE SWELLING NOTED IN ANKLES    HPI: BP is better. However the increased amlodipine resulted in puffy legs. No chest pain, no SOB Hasn't commited to change diet. Eating more veggies and fruits but eating fruit sallads and some veggies with dressing.  Patient is here for follow up of hypertension: denies Headache;deniesChest Pain;denies weakness;denies Shortness of Breath or Orthopnea;denies Visual changes;denies palpitations;denies cough;  pedal edema legs puffy as stated above ;denies symptoms of TIA or stroke; admits to Compliance with medications. denies Problems with medications otherwise.   Past Medical History  Diagnosis Date  . Amnesia   . Polio   . Migraine   . Hyperglycemia   . Hyperlipidemia   . Hypertension   . Hypothyroid   . Seasonal allergies   . Vitamin D deficiency   . Obesity   . Fatty liver    Past Surgical History  Procedure Laterality Date  . Abdominal hysterectomy    . Cholecystectomy    . Carpal tunnel release    . Back surgery    . Knee surgery      right   History   Social History  . Marital Status: Married    Spouse Name: N/A    Number of Children: N/A  . Years of Education: N/A   Occupational History  . Not on file.   Social History Main Topics  . Smoking status: Never Smoker   . Smokeless tobacco: Not on file  . Alcohol Use: No  . Drug Use: No  . Sexual Activity: Not on file   Other Topics Concern  . Not on file   Social History Narrative  . No narrative on file   Family History  Problem Relation Age of Onset  . Heart failure Mother   . Cancer Father   . Cancer Brother    Current Outpatient Prescriptions on File Prior to Visit  Medication Sig Dispense Refill  . amLODipine (NORVASC) 5 MG tablet Take 1 tablet (5 mg total) by mouth daily.  30  tablet  5  . aspirin 325 MG tablet Take 325 mg by mouth daily.      . benzonatate (TESSALON) 200 MG capsule Take 200 mg by mouth 3 (three) times daily as needed for cough.      Sarajane Marek Sodium 30-100 MG CAPS Take by mouth.      . Cholecalciferol (VITAMIN D3) 2000 UNITS capsule Take 2,000 Units by mouth daily.      . clobetasol ointment (TEMOVATE) 5.27 % Apply 1 application topically 2 (two) times daily.      . clotrimazole-betamethasone (LOTRISONE) cream Apply 1 application topically 2 (two) times daily.      . diphenoxylate-atropine (LOMOTIL) 2.5-0.025 MG per tablet Take by mouth 4 (four) times daily as needed for diarrhea or loose stools.      . Docosahexaenoic Acid (DHA COMPLETE PO) Take 1 tablet by mouth daily.      Marland Kitchen docusate sodium (COLACE) 100 MG capsule Take 100 mg by mouth 2 (two) times daily.      . fenofibrate (TRICOR) 48 MG tablet TAKE ONE TABLET BY MOUTH ONCE DAILY  30 tablet  3  . guaiFENesin (MUCINEX) 600 MG 12 hr tablet Take 1,200 mg by mouth.      . levothyroxine (SYNTHROID,  LEVOTHROID) 150 MCG tablet Take 150 mcg by mouth daily. Monday and Friday      . levothyroxine (SYNTHROID, LEVOTHROID) 175 MCG tablet TAKE ONE TABLET BY MOUTH EVERY DAY  30 tablet  10  . losartan (COZAAR) 100 MG tablet TAKE ONE TABLET BY MOUTH EVERY DAY  90 tablet  3  . metoprolol tartrate (LOPRESSOR) 25 MG tablet       . omeprazole (PRILOSEC) 20 MG capsule Take 20 mg by mouth daily.      . VENTOLIN HFA 108 (90 BASE) MCG/ACT inhaler       . ciprofloxacin (CIPRO) 500 MG tablet Take 1 tablet (500 mg total) by mouth 2 (two) times daily.  20 tablet  0  . dextromethorphan (DELSYM) 30 MG/5ML liquid Take by mouth as needed for cough.      . Magnesium Chloride (SLOW-MAG PO) Take 1 tablet by mouth.      . Multiple Vitamin (MULTIVITAMIN WITH MINERALS) TABS Take 1 tablet by mouth daily. Women's One A Day       No current facility-administered medications on file prior to visit.   Allergies  Allergen  Reactions  . Codeine Itching  . Other Itching    Most pain meds cause itching.  When has to take pain medication, she has been instructed to take Benadryl  . Doxycycline Rash   Immunization History  Administered Date(s) Administered  . Influenza, High Dose Seasonal PF 03/20/2013  . Pneumococcal Polysaccharide-23 02/07/2009  . Tdap 05/10/2010  . Zoster 05/10/2010   Prior to Admission medications   Medication Sig Start Date End Date Taking? Authorizing Provider  amLODipine (NORVASC) 5 MG tablet Take 1 tablet (5 mg total) by mouth daily. 06/03/13  Yes Vernie Shanks, MD  aspirin 325 MG tablet Take 325 mg by mouth daily.   Yes Historical Provider, MD  benzonatate (TESSALON) 200 MG capsule Take 200 mg by mouth 3 (three) times daily as needed for cough.   Yes Historical Provider, MD  Casanthranol-Docusate Sodium 30-100 MG CAPS Take by mouth.   Yes Historical Provider, MD  Cholecalciferol (VITAMIN D3) 2000 UNITS capsule Take 2,000 Units by mouth daily.   Yes Historical Provider, MD  clobetasol ointment (TEMOVATE) 2.44 % Apply 1 application topically 2 (two) times daily.   Yes Historical Provider, MD  clotrimazole-betamethasone (LOTRISONE) cream Apply 1 application topically 2 (two) times daily.   Yes Historical Provider, MD  diphenoxylate-atropine (LOMOTIL) 2.5-0.025 MG per tablet Take by mouth 4 (four) times daily as needed for diarrhea or loose stools.   Yes Historical Provider, MD  Docosahexaenoic Acid (DHA COMPLETE PO) Take 1 tablet by mouth daily.   Yes Historical Provider, MD  docusate sodium (COLACE) 100 MG capsule Take 100 mg by mouth 2 (two) times daily.   Yes Historical Provider, MD  fenofibrate (TRICOR) 48 MG tablet TAKE ONE TABLET BY MOUTH ONCE DAILY 06/19/13  Yes Vernie Shanks, MD  guaiFENesin (MUCINEX) 600 MG 12 hr tablet Take 1,200 mg by mouth.   Yes Historical Provider, MD  levothyroxine (SYNTHROID, LEVOTHROID) 150 MCG tablet Take 150 mcg by mouth daily. Monday and Friday 12/21/12   Yes Lysbeth Penner, FNP  levothyroxine (SYNTHROID, LEVOTHROID) 175 MCG tablet TAKE ONE TABLET BY MOUTH EVERY DAY 05/13/13  Yes Vernie Shanks, MD  losartan (COZAAR) 100 MG tablet TAKE ONE TABLET BY MOUTH EVERY DAY 01/04/13  Yes Vernie Shanks, MD  metoprolol tartrate (LOPRESSOR) 25 MG tablet  05/14/13  Yes Historical Provider, MD  omeprazole (  PRILOSEC) 20 MG capsule Take 20 mg by mouth daily.   Yes Historical Provider, MD  VENTOLIN HFA 108 (90 BASE) MCG/ACT inhaler  10/23/12  Yes Historical Provider, MD  ciprofloxacin (CIPRO) 500 MG tablet Take 1 tablet (500 mg total) by mouth 2 (two) times daily. 04/13/13   Vernie Shanks, MD  dextromethorphan (DELSYM) 30 MG/5ML liquid Take by mouth as needed for cough.    Historical Provider, MD  Magnesium Chloride (SLOW-MAG PO) Take 1 tablet by mouth.    Historical Provider, MD  Multiple Vitamin (MULTIVITAMIN WITH MINERALS) TABS Take 1 tablet by mouth daily. Women's One A Day    Historical Provider, MD     ROS: As above in the HPI. All other systems are stable or negative.  OBJECTIVE: APPEARANCE:  Patient in no acute distress.The patient appeared well nourished and normally developed. Acyanotic. Waist: VITAL SIGNS:BP 147/88  Pulse 89  Temp(Src) 97.7 F (36.5 C) (Oral)  Ht 5\' 4"  (1.626 m)  Wt 249 lb 6.4 oz (113.127 kg)  BMI 42.79 kg/m2  Obese WF Recheck BP 140/85  Reviewed her BP readings and the vary from the 120s to 140s/ 70s to 80s  SKIN: warm and  Dry without overt rashes, tattoos and scars  HEAD and Neck: without JVD, Head and scalp: normal Eyes:No scleral icterus. Fundi normal, eye movements normal. Ears: Auricle normal, canal normal, Tympanic membranes normal, insufflation normal. Nose: normal Throat: normal Neck & thyroid: normal  CHEST & LUNGS: Chest wall: normal Lungs: Clear  CVS: Reveals the PMI to be normally located. Regular rhythm, First and Second Heart sounds are normal,  absence of murmurs, rubs or gallops. Peripheral  vasculature: Radial pulses: normal Dorsal pedis pulses: normal Posterior pulses: normal  ABDOMEN:  Appearance: morbidly obese Benign, no organomegaly, no masses, no Abdominal Aortic enlargement. No Guarding , no rebound. No Bruits. Bowel sounds: normal  RECTAL: N/A GU: N/A  EXTREMETIES: 1+edematous.  MUSCULOSKELETAL:  Spine: normal Joints: intact  NEUROLOGIC: oriented to time,place and person; nonfocal. Results for orders placed in visit on 06/01/13  URINE CULTURE      Result Value Ref Range   Urine Culture, Routine Final report     Result 1 Comment    PTH, INTACT AND CALCIUM      Result Value Ref Range   Calcium 10.4 (*) 8.7 - 10.3 mg/dL   PTH 38  15 - 65 pg/mL   PTH Comment    CALCIUM, IONIZED      Result Value Ref Range   Calcium, Ion 5.8 (*) 4.5 - 5.6 mg/dL  POCT UA - MICROSCOPIC ONLY      Result Value Ref Range   WBC, Ur, HPF, POC 5-7     RBC, urine, microscopic neg     Bacteria, U Microscopic neg     Mucus, UA neg     Epithelial cells, urine per micros occ     Crystals, Ur, HPF, POC neg     Casts, Ur, LPF, POC neg     Yeast, UA neg    POCT URINALYSIS DIPSTICK      Result Value Ref Range   Color, UA yellow     Clarity, UA clear     Glucose, UA neg     Bilirubin, UA neg     Ketones, UA neg     Spec Grav, UA 1.010     Blood, UA neg     pH, UA 6.0     Protein, UA neg  Urobilinogen, UA negative     Nitrite, UA neg     Leukocytes, UA Trace      ASSESSMENT:  HYPERTENSION  Obesity  Hyperlipidemia  Vitamin D deficiency  Hyperglycemia  Poorly motivated to diet and exercise. She has purchased the books on nutrition, looked at them but did not use them.  Makes her own recipes which are high in sugars.  PLAN:      Dr Paula Libra Recommendations  For nutrition information, I recommend books:  1).Eat to Live by Dr Excell Seltzer. 2).Prevent and Reverse Heart Disease by Dr Karl Luke. 3) Dr Janene Harvey Book:  Program to Reverse  Diabetes  Recommend that the patient makes an effort to start using the books for guidancwe.  Exercise recommendations are:  If unable to walk, then the patient can exercise in a chair 3 times a day. By flapping arms like a bird gently and raising legs outwards to the front.  If ambulatory, the patient can go for walks for 30 minutes 3 times a week. Then increase the intensity and duration as tolerated.  Goal is to try to attain exercise frequency to 5 times a week.  If applicable: Best to perform resistance exercises (machines or weights) 2 days a week and cardio type exercises 3 days per week.  Continue present medications.   No orders of the defined types were placed in this encounter.   No orders of the defined types were placed in this encounter.   There are no discontinued medications. Return in about 6 weeks (around 08/03/2013) for Recheck medical problems.  Marjorie Deprey P. Jacelyn Grip, M.D.

## 2013-06-22 NOTE — Patient Instructions (Addendum)
      Dr Paula Libra Recommendations  For nutrition information, I recommend books:  1).Eat to Live by Dr Excell Seltzer. 2).Prevent and Reverse Heart Disease by Dr Karl Luke. 3) Dr Janene Harvey Book:  Program to Reverse Diabetes   You need to read and use the books.  Exercise recommendations are:  If unable to walk, then the patient can exercise in a chair 3 times a day. By flapping arms like a bird gently and raising legs outwards to the front.  If ambulatory, the patient can go for walks for 30 minutes 3 times a week. Then increase the intensity and duration as tolerated.  Goal is to try to attain exercise frequency to 5 times a week.  If applicable: Best to perform resistance exercises (machines or weights) 2 days a week and cardio type exercises 3 days per week.

## 2013-07-13 ENCOUNTER — Other Ambulatory Visit: Payer: Self-pay | Admitting: Gastroenterology

## 2013-07-13 DIAGNOSIS — K838 Other specified diseases of biliary tract: Secondary | ICD-10-CM

## 2013-07-19 ENCOUNTER — Ambulatory Visit
Admission: RE | Admit: 2013-07-19 | Discharge: 2013-07-19 | Disposition: A | Discharge status: Home / Self Care | Payer: Medicare Other | Source: Ambulatory Visit | Attending: Gastroenterology | Admitting: Gastroenterology

## 2013-07-19 DIAGNOSIS — K838 Other specified diseases of biliary tract: Secondary | ICD-10-CM

## 2013-07-19 MED ORDER — GADOBENATE DIMEGLUMINE 529 MG/ML IV SOLN
20.0000 mL | Freq: Once | INTRAVENOUS | Status: AC | PRN
  Administered 2013-07-19: 20 mL via INTRAVENOUS

## 2013-07-25 ENCOUNTER — Telehealth: Payer: Self-pay | Admitting: Family Medicine

## 2013-07-25 NOTE — Telephone Encounter (Signed)
Vomiting and diarrhea. She is having severe diarrhea and has not tried anything otc i advised her to try some immodium and see if that helped and if not to call us back

## 2013-08-02 ENCOUNTER — Ambulatory Visit (INDEPENDENT_AMBULATORY_CARE_PROVIDER_SITE_OTHER): Payer: Medicare Other | Admitting: Family Medicine

## 2013-08-02 ENCOUNTER — Encounter: Payer: Self-pay | Admitting: Family Medicine

## 2013-08-02 VITALS — BP 151/88 | HR 69 | Temp 97.2°F | Ht 64.0 in | Wt 246.4 lb

## 2013-08-02 DIAGNOSIS — R4701 Aphasia: Secondary | ICD-10-CM

## 2013-08-02 DIAGNOSIS — E039 Hypothyroidism, unspecified: Secondary | ICD-10-CM

## 2013-08-02 DIAGNOSIS — G459 Transient cerebral ischemic attack, unspecified: Secondary | ICD-10-CM

## 2013-08-02 DIAGNOSIS — R739 Hyperglycemia, unspecified: Secondary | ICD-10-CM

## 2013-08-02 DIAGNOSIS — E669 Obesity, unspecified: Secondary | ICD-10-CM

## 2013-08-02 DIAGNOSIS — R7309 Other abnormal glucose: Secondary | ICD-10-CM

## 2013-08-02 DIAGNOSIS — E559 Vitamin D deficiency, unspecified: Secondary | ICD-10-CM

## 2013-08-02 DIAGNOSIS — K7689 Other specified diseases of liver: Secondary | ICD-10-CM

## 2013-08-02 DIAGNOSIS — G43909 Migraine, unspecified, not intractable, without status migrainosus: Secondary | ICD-10-CM

## 2013-08-02 DIAGNOSIS — A809 Acute poliomyelitis, unspecified: Secondary | ICD-10-CM

## 2013-08-02 DIAGNOSIS — G4733 Obstructive sleep apnea (adult) (pediatric): Secondary | ICD-10-CM

## 2013-08-02 DIAGNOSIS — E785 Hyperlipidemia, unspecified: Secondary | ICD-10-CM

## 2013-08-02 DIAGNOSIS — K760 Fatty (change of) liver, not elsewhere classified: Secondary | ICD-10-CM

## 2013-08-02 DIAGNOSIS — I10 Essential (primary) hypertension: Secondary | ICD-10-CM

## 2013-08-02 LAB — POCT CBC
Granulocyte percent: 50.9 %G (ref 37–80)
HCT, POC: 41.7 % (ref 37.7–47.9)
Hemoglobin: 13.3 g/dL (ref 12.2–16.2)
Lymph, poc: 2.4 (ref 0.6–3.4)
MCH, POC: 28.7 pg (ref 27–31.2)
MCHC: 31.8 g/dL (ref 31.8–35.4)
MCV: 90.2 fL (ref 80–97)
MPV: 7.9 fL (ref 0–99.8)
POC Granulocyte: 3.1 (ref 2–6.9)
POC LYMPH PERCENT: 39.4 %L (ref 10–50)
Platelet Count, POC: 327 10*3/uL (ref 142–424)
RBC: 4.6 M/uL (ref 4.04–5.48)
RDW, POC: 13.9 %
WBC: 6 10*3/uL (ref 4.6–10.2)

## 2013-08-02 NOTE — Progress Notes (Signed)
Patient ID: Karen Potter, female   DOB: 11-Oct-1940, 73 y.o.   MRN: [id number] SUBJECTIVE: CC: Chief Complaint  Patient presents with  . Follow-up    6 wk follow up c/o toes ache . dr Watt Climes wants labs ordered see note    HPI: Patient is here for follow up of hypertension/Obese/HLD: denies Headache;deniesChest Pain;denies weakness;denies Shortness of Breath or Orthopnea;denies Visual changes;denies palpitations;denies cough;denies pedal edema;denies symptoms of TIA or stroke; admits to Compliance with medications. denies Problems with medications. BPs has been 110-120/80s at home with a new BP machine.  Fatty Liver. Sees Dr Watt Climes and he requires special tests as part of her work up.  Past Medical History  Diagnosis Date  . Amnesia   . Polio   . Migraine   . Hyperglycemia   . Hyperlipidemia   . Hypertension   . Hypothyroid   . Seasonal allergies   . Vitamin D deficiency   . Obesity   . Fatty liver    Past Surgical History  Procedure Laterality Date  . Abdominal hysterectomy    . Cholecystectomy    . Carpal tunnel release    . Back surgery    . Knee surgery      right   History   Social History  . Marital Status: Married    Spouse Name: N/A    Number of Children: N/A  . Years of Education: N/A   Occupational History  . Not on file.   Social History Main Topics  . Smoking status: Never Smoker   . Smokeless tobacco: Not on file  . Alcohol Use: No  . Drug Use: No  . Sexual Activity: Not on file   Other Topics Concern  . Not on file   Social History Narrative  . No narrative on file   Family History  Problem Relation Age of Onset  . Heart failure Mother   . Cancer Father   . Cancer Brother    Current Outpatient Prescriptions on File Prior to Visit  Medication Sig Dispense Refill  . amLODipine (NORVASC) 5 MG tablet Take 1 tablet (5 mg total) by mouth daily.  30 tablet  5  . aspirin 325 MG tablet Take 325 mg by mouth daily.      . Cholecalciferol  (VITAMIN D3) 2000 UNITS capsule Take 2,000 Units by mouth daily.      . fenofibrate (TRICOR) 48 MG tablet TAKE ONE TABLET BY MOUTH ONCE DAILY  30 tablet  3  . guaiFENesin (MUCINEX) 600 MG 12 hr tablet Take 1,200 mg by mouth.      . levothyroxine (SYNTHROID, LEVOTHROID) 150 MCG tablet Take 150 mcg by mouth daily. Monday and Friday      . levothyroxine (SYNTHROID, LEVOTHROID) 175 MCG tablet TAKE ONE TABLET BY MOUTH EVERY DAY  30 tablet  10  . losartan (COZAAR) 100 MG tablet TAKE ONE TABLET BY MOUTH EVERY DAY  90 tablet  3  . metoprolol tartrate (LOPRESSOR) 25 MG tablet       . benzonatate (TESSALON) 200 MG capsule Take 200 mg by mouth 3 (three) times daily as needed for cough.      Sarajane Marek Sodium 30-100 MG CAPS Take by mouth.      . ciprofloxacin (CIPRO) 500 MG tablet Take 1 tablet (500 mg total) by mouth 2 (two) times daily.  20 tablet  0  . clobetasol ointment (TEMOVATE) 1.24 % Apply 1 application topically 2 (two) times daily.      Marland Kitchen  clotrimazole-betamethasone (LOTRISONE) cream Apply 1 application topically 2 (two) times daily.      Marland Kitchen dextromethorphan (DELSYM) 30 MG/5ML liquid Take by mouth as needed for cough.      . diphenoxylate-atropine (LOMOTIL) 2.5-0.025 MG per tablet Take by mouth 4 (four) times daily as needed for diarrhea or loose stools.      . Docosahexaenoic Acid (DHA COMPLETE PO) Take 1 tablet by mouth daily.      Marland Kitchen docusate sodium (COLACE) 100 MG capsule Take 100 mg by mouth 2 (two) times daily.      . Magnesium Chloride (SLOW-MAG PO) Take 1 tablet by mouth.      . Multiple Vitamin (MULTIVITAMIN WITH MINERALS) TABS Take 1 tablet by mouth daily. Women's One A Day      . omeprazole (PRILOSEC) 20 MG capsule Take 20 mg by mouth daily.      . VENTOLIN HFA 108 (90 BASE) MCG/ACT inhaler        No current facility-administered medications on file prior to visit.   Allergies  Allergen Reactions  . Codeine Itching  . Other Itching    Most pain meds cause itching.  When  has to take pain medication, she has been instructed to take Benadryl  . Doxycycline Rash   Immunization History  Administered Date(s) Administered  . Influenza, High Dose Seasonal PF 03/20/2013  . Pneumococcal Polysaccharide-23 02/07/2009  . Tdap 05/10/2010  . Zoster 05/10/2010   Prior to Admission medications   Medication Sig Start Date End Date Taking? Authorizing Provider  amLODipine (NORVASC) 5 MG tablet Take 1 tablet (5 mg total) by mouth daily. 06/03/13  Yes Vernie Shanks, MD  aspirin 325 MG tablet Take 325 mg by mouth daily.   Yes Historical Provider, MD  Cholecalciferol (VITAMIN D3) 2000 UNITS capsule Take 2,000 Units by mouth daily.   Yes Historical Provider, MD  fenofibrate (TRICOR) 48 MG tablet TAKE ONE TABLET BY MOUTH ONCE DAILY 06/19/13  Yes Vernie Shanks, MD  guaiFENesin (MUCINEX) 600 MG 12 hr tablet Take 1,200 mg by mouth.   Yes Historical Provider, MD  levothyroxine (SYNTHROID, LEVOTHROID) 150 MCG tablet Take 150 mcg by mouth daily. Monday and Friday 12/21/12  Yes Lysbeth Penner, FNP  levothyroxine (SYNTHROID, LEVOTHROID) 175 MCG tablet TAKE ONE TABLET BY MOUTH EVERY DAY 05/13/13  Yes Vernie Shanks, MD  losartan (COZAAR) 100 MG tablet TAKE ONE TABLET BY MOUTH EVERY DAY 01/04/13  Yes Vernie Shanks, MD  metoprolol tartrate (LOPRESSOR) 25 MG tablet  05/14/13  Yes Historical Provider, MD  benzonatate (TESSALON) 200 MG capsule Take 200 mg by mouth 3 (three) times daily as needed for cough.    Historical Provider, MD  Casanthranol-Docusate Sodium 30-100 MG CAPS Take by mouth.    Historical Provider, MD  ciprofloxacin (CIPRO) 500 MG tablet Take 1 tablet (500 mg total) by mouth 2 (two) times daily. 04/13/13   Vernie Shanks, MD  clobetasol ointment (TEMOVATE) 4.65 % Apply 1 application topically 2 (two) times daily.    Historical Provider, MD  clotrimazole-betamethasone (LOTRISONE) cream Apply 1 application topically 2 (two) times daily.    Historical Provider, MD  dextromethorphan  (DELSYM) 30 MG/5ML liquid Take by mouth as needed for cough.    Historical Provider, MD  diphenoxylate-atropine (LOMOTIL) 2.5-0.025 MG per tablet Take by mouth 4 (four) times daily as needed for diarrhea or loose stools.    Historical Provider, MD  Docosahexaenoic Acid (DHA COMPLETE PO) Take 1 tablet by mouth daily.  Historical Provider, MD  docusate sodium (COLACE) 100 MG capsule Take 100 mg by mouth 2 (two) times daily.    Historical Provider, MD  Magnesium Chloride (SLOW-MAG PO) Take 1 tablet by mouth.    Historical Provider, MD  Multiple Vitamin (MULTIVITAMIN WITH MINERALS) TABS Take 1 tablet by mouth daily. Women's One A Day    Historical Provider, MD  omeprazole (PRILOSEC) 20 MG capsule Take 20 mg by mouth daily.    Historical Provider, MD  VENTOLIN HFA 108 (90 BASE) MCG/ACT inhaler  10/23/12   Historical Provider, MD     ROS: As above in the HPI. All other systems are stable or negative.  OBJECTIVE: APPEARANCE:  Patient in no acute distress.The patient appeared well nourished and normally developed. Acyanotic. Waist: VITAL SIGNS:BP 151/88  Pulse 69  Temp(Src) 97.2 F (36.2 C) (Oral)  Ht '5\' 4"'  (1.626 m)  Wt 246 lb 6.4 oz (111.766 kg)  BMI 42.27 kg/m2  WF obese  SKIN: warm and  Dry without overt rashes, tattoos and scars  HEAD and Neck: without JVD, Head and scalp: normal Eyes:No scleral icterus. Fundi normal, eye movements normal. Ears: Auricle normal, canal normal, Tympanic membranes normal, insufflation normal. Nose: normal Throat: normal Neck & thyroid: normal  CHEST & LUNGS: Chest wall: normal Lungs: Clear  CVS: Reveals the PMI to be normally located. Regular rhythm, First and Second Heart sounds are normal,  absence of murmurs, rubs or gallops. Peripheral vasculature: Radial pulses: normal Dorsal pedis pulses: normal Posterior pulses: normal  ABDOMEN:  Appearance: obese Benign, no organomegaly, no masses, no Abdominal Aortic enlargement. No Guarding  , no rebound. No Bruits. Bowel sounds: normal  RECTAL: N/A GU: N/A  EXTREMETIES: nonedematous.  MUSCULOSKELETAL:  Spine: normal Joints: intact. Little toes medially deviated. Sensory intact. No ulcerations ,  NEUROLOGIC: oriented to time,place and person; nonfocal. Cranial Nerves are normal. Results for orders placed in visit on 08/02/13  POCT CBC      Result Value Ref Range   WBC 6.0  4.6 - 10.2 K/uL   Lymph, poc 2.4  0.6 - 3.4   POC LYMPH PERCENT 39.4  10 - 50 %L   MID (cbc)    0 - 0.9   POC MID %    0 - 12 %M   POC Granulocyte 3.1  2 - 6.9   Granulocyte percent 50.9  37 - 80 %G   RBC 4.6  4.04 - 5.48 M/uL   Hemoglobin 13.3  12.2 - 16.2 g/dL   HCT, POC 41.7  37.7 - 47.9 %   MCV 90.2  80 - 97 fL   MCH, POC 28.7  27 - 31.2 pg   MCHC 31.8  31.8 - 35.4 g/dL   RDW, POC 13.9     Platelet Count, POC 327.0  142 - 424 K/uL   MPV 7.9  0 - 99.8 fL    ASSESSMENT:  TIA (transient ischemic attack) - Plan: Protime-INR, Anti-Smooth Muscle Antibody, IGG  OBSTRUCTIVE SLEEP APNEA  Obesity  Hypothyroid - Plan: TSH  HYPERTENSION - Plan: CMP14+EGFR  Hyperlipidemia - Plan: CMP14+EGFR, Lipid panel  Hyperglycemia  Vitamin D deficiency  Polio  Migraine  Fatty liver - Plan: POCT CBC, CMP14+EGFR, Lipase, CANCELED: POCT INR, CANCELED: Anti-smooth muscle antibody, IgG  Aphasia - Plan: Protime-INR, Anti-Smooth Muscle Antibody, IGG  PLAN:  reviewed my previous recommendations.      Dr Paula Libra Recommendations  For nutrition information, I recommend books:  1).Eat to Live by Dr Excell Seltzer.  2).Prevent and Reverse Heart Disease by Dr Karl Luke. 3) Dr Janene Harvey Book:  Program to Reverse Diabetes  Exercise recommendations are:  If unable to walk, then the patient can exercise in a chair 3 times a day. By flapping arms like a bird gently and raising legs outwards to the front.  If ambulatory, the patient can go for walks for 30 minutes 3 times a week.  Then increase the intensity and duration as tolerated.  Goal is to try to attain exercise frequency to 5 times a week.  If applicable: Best to perform resistance exercises (machines or weights) 2 days a week and cardio type exercises 3 days per week.  Orders Placed This Encounter  Procedures  . CMP14+EGFR  . Lipid panel  . Lipase  . TSH  . Protime-INR  . Anti-Smooth Muscle Antibody, IGG  . POCT CBC   No orders of the defined types were placed in this encounter.   There are no discontinued medications. Return in about 3 months (around 11/02/2013) for Recheck medical problems.  Sladen Plancarte P. Jacelyn Grip, M.D.

## 2013-08-03 LAB — CMP14+EGFR
ALT: 64 IU/L — ABNORMAL HIGH (ref 0–32)
AST: 39 IU/L (ref 0–40)
Albumin/Globulin Ratio: 1.6 (ref 1.1–2.5)
Albumin: 4.4 g/dL (ref 3.5–4.8)
Alkaline Phosphatase: 118 IU/L — ABNORMAL HIGH (ref 39–117)
BUN/Creatinine Ratio: 14 (ref 11–26)
BUN: 10 mg/dL (ref 8–27)
CO2: 25 mmol/L (ref 18–29)
Calcium: 10.4 mg/dL — ABNORMAL HIGH (ref 8.7–10.3)
Chloride: 102 mmol/L (ref 97–108)
Creatinine, Ser: 0.74 mg/dL (ref 0.57–1.00)
GFR calc Af Amer: 94 mL/min/{1.73_m2} (ref >59)
GFR calc non Af Amer: 82 mL/min/{1.73_m2} (ref >59)
Globulin, Total: 2.7 g/dL (ref 1.5–4.5)
Glucose: 104 mg/dL — ABNORMAL HIGH (ref 65–99)
Potassium: 4.4 mmol/L (ref 3.5–5.2)
Sodium: 143 mmol/L (ref 134–144)
Total Bilirubin: 0.6 mg/dL (ref 0.0–1.2)
Total Protein: 7.1 g/dL (ref 6.0–8.5)

## 2013-08-03 LAB — LIPID PANEL
Chol/HDL Ratio: 3.1 ratio units (ref 0.0–4.4)
Cholesterol, Total: 105 mg/dL (ref 100–199)
HDL: 34 mg/dL — ABNORMAL LOW (ref >39)
LDL Calculated: 34 mg/dL (ref 0–99)
Triglycerides: 187 mg/dL — ABNORMAL HIGH (ref 0–149)
VLDL Cholesterol Cal: 37 mg/dL (ref 5–40)

## 2013-08-03 LAB — TSH: TSH: 6.94 u[IU]/mL — ABNORMAL HIGH (ref 0.450–4.500)

## 2013-08-03 LAB — PROTIME-INR
INR: 1.1 (ref 0.8–1.2)
Prothrombin Time: 11.3 s (ref 9.1–12.0)

## 2013-08-03 LAB — LIPASE: Lipase: 42 U/L (ref 0–59)

## 2013-08-05 ENCOUNTER — Other Ambulatory Visit: Payer: Self-pay | Admitting: Family Medicine

## 2013-08-05 DIAGNOSIS — E039 Hypothyroidism, unspecified: Secondary | ICD-10-CM

## 2013-08-06 LAB — ANTI-SMOOTH MUSCLE ANTIBODY, IGG: Smooth Muscle Ab: 6 U (ref <20)

## 2013-08-06 NOTE — Progress Notes (Signed)
Quick Note:  Call patient. Labs normal.smoothe muscle antibody test was normal. Copy to Dr Watt Climes. No change in plan. ______

## 2013-08-07 ENCOUNTER — Telehealth: Payer: Self-pay | Admitting: Family Medicine

## 2013-08-07 NOTE — Telephone Encounter (Signed)
Message copied by Waverly Ferrari on Tue Aug 07, 2013 10:21 AM ------      Message from: Vernie Shanks      Created: Mon Aug 06, 2013  5:38 PM       Call patient.      Labs normal.smoothe muscle antibody test was normal.      Copy to Dr Watt Climes.      No change in plan. ------

## 2013-08-07 NOTE — Telephone Encounter (Signed)
Patient aware.

## 2013-08-16 ENCOUNTER — Telehealth: Payer: Self-pay | Admitting: *Deleted

## 2013-08-16 ENCOUNTER — Encounter: Payer: Self-pay | Admitting: *Deleted

## 2013-08-16 NOTE — Telephone Encounter (Signed)
Spoke with pt and she states her legs very swollen and she has had them elevated . Coughing clear sputum and states no moving around much due to gets SOB and BP up.  Spoke with [facility] and advised to go to [facility] for evaluation  Pt verbalized understanding.

## 2013-08-16 NOTE — Telephone Encounter (Signed)
Patient was put on amlodipine by Dr. Jacelyn Grip and he told her that it could possibly cause swelling and her legs are really swollen today. She has not taken her Amlodipine today. Her Bp right not was 170/93. I advised patient to hold medication until she heard from one of [place]

## 2013-08-16 NOTE — Telephone Encounter (Signed)
Spoke with pt and she states she took bp 9min ago it was 148/84 and she stated she would hold off on going to hosp Pt was advised if symptoms such as shortness of breath or difficulty breathing to proceed to the nearest ER Pt was informed Dr Jacelyn Grip would be back tomorrow and would discuss with him tomorrow

## 2013-08-16 NOTE — Progress Notes (Signed)
Quick Note:  Copy of labs sent to patient ______ 

## 2013-08-17 ENCOUNTER — Ambulatory Visit (INDEPENDENT_AMBULATORY_CARE_PROVIDER_SITE_OTHER): Payer: Medicare Other

## 2013-08-17 ENCOUNTER — Encounter: Payer: Self-pay | Admitting: Family Medicine

## 2013-08-17 ENCOUNTER — Ambulatory Visit (INDEPENDENT_AMBULATORY_CARE_PROVIDER_SITE_OTHER): Payer: Medicare Other | Admitting: Family Medicine

## 2013-08-17 VITALS — BP 182/86 | HR 86 | Temp 97.7°F | Ht 64.0 in | Wt 247.8 lb

## 2013-08-17 DIAGNOSIS — R059 Cough, unspecified: Secondary | ICD-10-CM

## 2013-08-17 DIAGNOSIS — J209 Acute bronchitis, unspecified: Secondary | ICD-10-CM

## 2013-08-17 DIAGNOSIS — R05 Cough: Secondary | ICD-10-CM

## 2013-08-17 DIAGNOSIS — R0602 Shortness of breath: Secondary | ICD-10-CM

## 2013-08-17 DIAGNOSIS — I10 Essential (primary) hypertension: Secondary | ICD-10-CM

## 2013-08-17 MED ORDER — LOSARTAN POTASSIUM-HCTZ 100-12.5 MG PO TABS: 1.0000 | ORAL_TABLET | Freq: Every day | ORAL | Status: DC

## 2013-08-17 MED ORDER — METHYLPREDNISOLONE ACETATE 80 MG/ML IJ SUSP
80.0000 mg | Freq: Once | INTRAMUSCULAR | Status: AC
  Administered 2013-08-17: 80 mg via INTRAMUSCULAR

## 2013-08-17 MED ORDER — AMOXICILLIN 875 MG PO TABS: 875.0000 mg | ORAL_TABLET | Freq: Two times a day (BID) | ORAL | Status: DC

## 2013-08-17 MED ORDER — CARVEDILOL 6.25 MG PO TABS: 6.2500 mg | ORAL_TABLET | Freq: Two times a day (BID) | ORAL | Status: DC

## 2013-08-17 MED ORDER — ALBUTEROL SULFATE HFA 108 (90 BASE) MCG/ACT IN AERS: 2.0000 | INHALATION_SPRAY | RESPIRATORY_TRACT | Status: DC | PRN

## 2013-08-17 MED ORDER — METHYLPREDNISOLONE (PAK) 4 MG PO TABS: ORAL_TABLET | ORAL | Status: DC

## 2013-08-17 NOTE — Progress Notes (Signed)
   Subjective:    Patient ID: Karen Potter, female    DOB: 1940/12/26, 73 y.o.   MRN: [id number]  HPI This 73 y.o. female presents for evaluation of uri sx's and cough.   Review of Systems No chest pain, SOB, HA, dizziness, vision change, N/V, diarrhea, constipation, dysuria, urinary urgency or frequency, myalgias, arthralgias or rash.     Objective:   Physical Exam Vital signs noted  Well developed well nourished female.  HEENT - Head atraumatic Normocephalic                Eyes - PERRLA, Conjuctiva - clear Sclera- Clear EOMI                Ears - EAC's Wnl TM's Wnl Gross Hearing WNL                Throat - oropharanx wnl Respiratory - Lungs CTA bilateral Cardiac - RRR S1 and S2 without murmur GI - Abdomen soft Nontender and bowel sounds active x 4 Extremities - No edema. Neuro - Grossly intact.       Assessment & Plan:  Cough - Plan: DG Chest 2 View, albuterol (VENTOLIN HFA) 108 (90 BASE) MCG/ACT inhaler, amoxicillin (AMOXIL) 875 MG tablet, methylPREDNIsolone (MEDROL DOSPACK) 4 MG tablet, methylPREDNISolone acetate (DEPO-MEDROL) injection 80 mg, carvedilol (COREG) 6.25 MG tablet, losartan-hydrochlorothiazide (HYZAAR) 100-12.5 MG per tablet  SOB (shortness of breath) - Plan: DG Chest 2 View, albuterol (VENTOLIN HFA) 108 (90 BASE) MCG/ACT inhaler, amoxicillin (AMOXIL) 875 MG tablet, methylPREDNIsolone (MEDROL DOSPACK) 4 MG tablet, methylPREDNISolone acetate (DEPO-MEDROL) injection 80 mg, carvedilol (COREG) 6.25 MG tablet, losartan-hydrochlorothiazide (HYZAAR) 100-12.5 MG per tablet  Acute bronchitis - Plan: albuterol (VENTOLIN HFA) 108 (90 BASE) MCG/ACT inhaler, amoxicillin (AMOXIL) 875 MG tablet, methylPREDNIsolone (MEDROL DOSPACK) 4 MG tablet, methylPREDNISolone acetate (DEPO-MEDROL) injection 80 mg, carvedilol (COREG) 6.25 MG tablet, losartan-hydrochlorothiazide (HYZAAR) 100-12.5 MG per tablet  Essential hypertension, benign - Plan: carvedilol (COREG) 6.25 MG tablet,  losartan-hydrochlorothiazide (HYZAAR) 100-12.5 MG per tablet  Lysbeth Penner FNP

## 2013-08-17 NOTE — Telephone Encounter (Signed)
Pt was seen today by Dietrich Pates and evaluated

## 2013-08-20 ENCOUNTER — Telehealth: Payer: Self-pay | Admitting: Family Medicine

## 2013-08-20 NOTE — Telephone Encounter (Signed)
Appt changed per patients request

## 2013-08-31 ENCOUNTER — Ambulatory Visit: Payer: Medicare Other | Admitting: Family Medicine

## 2013-09-03 ENCOUNTER — Ambulatory Visit (INDEPENDENT_AMBULATORY_CARE_PROVIDER_SITE_OTHER): Payer: Medicare Other | Admitting: Family Medicine

## 2013-09-03 ENCOUNTER — Encounter: Payer: Self-pay | Admitting: Family Medicine

## 2013-09-03 VITALS — BP 143/84 | HR 70 | Temp 97.7°F | Ht 64.0 in | Wt 245.2 lb

## 2013-09-03 DIAGNOSIS — R7309 Other abnormal glucose: Secondary | ICD-10-CM

## 2013-09-03 DIAGNOSIS — E039 Hypothyroidism, unspecified: Secondary | ICD-10-CM

## 2013-09-03 DIAGNOSIS — J209 Acute bronchitis, unspecified: Secondary | ICD-10-CM

## 2013-09-03 DIAGNOSIS — I10 Essential (primary) hypertension: Secondary | ICD-10-CM

## 2013-09-03 DIAGNOSIS — R739 Hyperglycemia, unspecified: Secondary | ICD-10-CM

## 2013-09-03 DIAGNOSIS — E785 Hyperlipidemia, unspecified: Secondary | ICD-10-CM

## 2013-09-03 DIAGNOSIS — E669 Obesity, unspecified: Secondary | ICD-10-CM

## 2013-09-03 DIAGNOSIS — G4733 Obstructive sleep apnea (adult) (pediatric): Secondary | ICD-10-CM

## 2013-09-03 DIAGNOSIS — R062 Wheezing: Secondary | ICD-10-CM

## 2013-09-03 DIAGNOSIS — E559 Vitamin D deficiency, unspecified: Secondary | ICD-10-CM

## 2013-09-03 MED ORDER — BECLOMETHASONE DIPROPIONATE 40 MCG/ACT IN AERS: 2.0000 | INHALATION_SPRAY | Freq: Two times a day (BID) | RESPIRATORY_TRACT | Status: DC

## 2013-09-03 NOTE — Progress Notes (Signed)
Patient ID: Karen Potter, female   DOB: 1941-01-16, 73 y.o.   MRN: [id number] SUBJECTIVE: CC: Chief Complaint  Patient presents with  . Follow-up    reck bronchitis per [facility]   doing better congestion lingering   . Gastrophageal Reflux    HPI: Here for follow up on Asthmatic bronchitis. Treated 2 weeks ago and her for follow up. She feels tremendously better. Only an occasional cough and still has a residual wheeze. Doesn't want to get addicted to albuterol inhaler so she hasn't used it until she is wheezing bad which is every other day. No chest pain, no fever , no orthopnea, no exertional dyspnea.the pedal edema has resolved.    Past Medical History  Diagnosis Date  . Amnesia   . Polio   . Migraine   . Hyperglycemia   . Hyperlipidemia   . Hypertension   . Hypothyroid   . Seasonal allergies   . Vitamin D deficiency   . Obesity   . Fatty liver    Past Surgical History  Procedure Laterality Date  . Abdominal hysterectomy    . Cholecystectomy    . Carpal tunnel release    . Back surgery    . Knee surgery      right   History   Social History  . Marital Status: Married    Spouse Name: N/A    Number of Children: N/A  . Years of Education: N/A   Occupational History  . Not on file.   Social History Main Topics  . Smoking status: Never Smoker   . Smokeless tobacco: Not on file  . Alcohol Use: No  . Drug Use: No  . Sexual Activity: Not on file   Other Topics Concern  . Not on file   Social History Narrative  . No narrative on file   Family History  Problem Relation Age of Onset  . Heart failure Mother   . Cancer Father   . Cancer Brother    Current Outpatient Prescriptions on File Prior to Visit  Medication Sig Dispense Refill  . carvedilol (COREG) 6.25 MG tablet Take 1 tablet (6.25 mg total) by mouth 2 (two) times daily with a meal.  60 tablet  3  . Cholecalciferol (VITAMIN D3) 2000 UNITS capsule Take 2,000 Units by mouth daily.      .  diphenoxylate-atropine (LOMOTIL) 2.5-0.025 MG per tablet Take by mouth 4 (four) times daily as needed for diarrhea or loose stools.      . Docosahexaenoic Acid (DHA COMPLETE PO) Take 1 tablet by mouth daily.      . fenofibrate (TRICOR) 48 MG tablet TAKE ONE TABLET BY MOUTH ONCE DAILY  30 tablet  3  . guaiFENesin (MUCINEX) 600 MG 12 hr tablet Take 1,200 mg by mouth.      . levothyroxine (SYNTHROID, LEVOTHROID) 150 MCG tablet Take 150 mcg by mouth daily. Monday and Friday      . levothyroxine (SYNTHROID, LEVOTHROID) 175 MCG tablet TAKE ONE TABLET BY MOUTH EVERY DAY  30 tablet  10  . losartan-hydrochlorothiazide (HYZAAR) 100-12.5 MG per tablet Take 1 tablet by mouth daily.  90 tablet  3  . omeprazole (PRILOSEC) 20 MG capsule Take 20 mg by mouth daily.      Marland Kitchen amoxicillin (AMOXIL) 875 MG tablet Take 1 tablet (875 mg total) by mouth 2 (two) times daily.  20 tablet  0  . aspirin 325 MG tablet Take 325 mg by mouth daily.      Marland Kitchen  benzonatate (TESSALON) 200 MG capsule Take 200 mg by mouth 3 (three) times daily as needed for cough.      Sarajane Marek Sodium 30-100 MG CAPS Take by mouth.      . ciprofloxacin (CIPRO) 500 MG tablet Take 1 tablet (500 mg total) by mouth 2 (two) times daily.  20 tablet  0  . clobetasol ointment (TEMOVATE) 7.62 % Apply 1 application topically 2 (two) times daily.      . clotrimazole-betamethasone (LOTRISONE) cream Apply 1 application topically 2 (two) times daily.      Marland Kitchen dextromethorphan (DELSYM) 30 MG/5ML liquid Take by mouth as needed for cough.      . docusate sodium (COLACE) 100 MG capsule Take 100 mg by mouth 2 (two) times daily.      . Magnesium Chloride (SLOW-MAG PO) Take 1 tablet by mouth.      . methylPREDNIsolone (MEDROL DOSPACK) 4 MG tablet follow package directions  21 tablet  0  . metoprolol tartrate (LOPRESSOR) 25 MG tablet       . Multiple Vitamin (MULTIVITAMIN WITH MINERALS) TABS Take 1 tablet by mouth daily. Women's One A Day       No current  facility-administered medications on file prior to visit.   Allergies  Allergen Reactions  . Codeine Itching  . Other Itching    Most pain meds cause itching.  When has to take pain medication, she has been instructed to take Benadryl  . Doxycycline Rash   Immunization History  Administered Date(s) Administered  . Influenza, High Dose Seasonal PF 03/20/2013  . Pneumococcal Polysaccharide-23 02/07/2009  . Tdap 05/10/2010  . Zoster 05/10/2010   Prior to Admission medications   Medication Sig Start Date End Date Taking? Authorizing Provider  carvedilol (COREG) 6.25 MG tablet Take 1 tablet (6.25 mg total) by mouth 2 (two) times daily with a meal. 08/17/13  Yes Lysbeth Penner, FNP  Cholecalciferol (VITAMIN D3) 2000 UNITS capsule Take 2,000 Units by mouth daily.   Yes Historical Provider, MD  diphenoxylate-atropine (LOMOTIL) 2.5-0.025 MG per tablet Take by mouth 4 (four) times daily as needed for diarrhea or loose stools.   Yes Historical Provider, MD  Docosahexaenoic Acid (DHA COMPLETE PO) Take 1 tablet by mouth daily.   Yes Historical Provider, MD  fenofibrate (TRICOR) 48 MG tablet TAKE ONE TABLET BY MOUTH ONCE DAILY 06/19/13  Yes Vernie Shanks, MD  guaiFENesin (MUCINEX) 600 MG 12 hr tablet Take 1,200 mg by mouth.   Yes Historical Provider, MD  levothyroxine (SYNTHROID, LEVOTHROID) 150 MCG tablet Take 150 mcg by mouth daily. Monday and Friday 12/21/12  Yes Lysbeth Penner, FNP  levothyroxine (SYNTHROID, LEVOTHROID) 175 MCG tablet TAKE ONE TABLET BY MOUTH EVERY DAY 05/13/13  Yes Vernie Shanks, MD  losartan-hydrochlorothiazide (HYZAAR) 100-12.5 MG per tablet Take 1 tablet by mouth daily. 08/17/13  Yes Lysbeth Penner, FNP  omeprazole (PRILOSEC) 20 MG capsule Take 20 mg by mouth daily.   Yes Historical Provider, MD  albuterol (PROVENTIL HFA;VENTOLIN HFA) 108 (90 BASE) MCG/ACT inhaler Inhale 2 puffs into the lungs every 6 (six) hours as needed for wheezing or shortness of breath. 08/17/13    Lysbeth Penner, FNP  amoxicillin (AMOXIL) 875 MG tablet Take 1 tablet (875 mg total) by mouth 2 (two) times daily. 08/17/13   Lysbeth Penner, FNP  aspirin 325 MG tablet Take 325 mg by mouth daily.    Historical Provider, MD  beclomethasone (QVAR) 40 MCG/ACT inhaler Inhale 2 puffs into  the lungs 2 (two) times daily. 09/03/13   Vernie Shanks, MD  benzonatate (TESSALON) 200 MG capsule Take 200 mg by mouth 3 (three) times daily as needed for cough.    Historical Provider, MD  Casanthranol-Docusate Sodium 30-100 MG CAPS Take by mouth.    Historical Provider, MD  ciprofloxacin (CIPRO) 500 MG tablet Take 1 tablet (500 mg total) by mouth 2 (two) times daily. 04/13/13   Vernie Shanks, MD  clobetasol ointment (TEMOVATE) 5.63 % Apply 1 application topically 2 (two) times daily.    Historical Provider, MD  clotrimazole-betamethasone (LOTRISONE) cream Apply 1 application topically 2 (two) times daily.    Historical Provider, MD  dextromethorphan (DELSYM) 30 MG/5ML liquid Take by mouth as needed for cough.    Historical Provider, MD  docusate sodium (COLACE) 100 MG capsule Take 100 mg by mouth 2 (two) times daily.    Historical Provider, MD  Magnesium Chloride (SLOW-MAG PO) Take 1 tablet by mouth.    Historical Provider, MD  methylPREDNIsolone (MEDROL DOSPACK) 4 MG tablet follow package directions 08/17/13   Lysbeth Penner, FNP  metoprolol tartrate (LOPRESSOR) 25 MG tablet  05/14/13   Historical Provider, MD  Multiple Vitamin (MULTIVITAMIN WITH MINERALS) TABS Take 1 tablet by mouth daily. Women's One A Day    Historical Provider, MD     ROS: As above in the HPI. All other systems are stable or negative.  OBJECTIVE: APPEARANCE:  Patient in no acute distress.The patient appeared well nourished and normally developed. Acyanotic. Waist: VITAL SIGNS:BP 143/84  Pulse 70  Temp(Src) 97.7 F (36.5 C) (Oral)  Ht $R'5\' 4"'wV$  (1.626 m)  Wt 245 lb 3.2 oz (111.222 kg)  BMI 42.07 kg/m2  SpO2 98%  Short stature  Obese WF  SKIN: warm and  Dry without overt rashes, tattoos and scars  HEAD and Neck: without JVD, Head and scalp: normal Eyes:No scleral icterus. Fundi normal, eye movements normal. Ears: Auricle normal, canal normal, Tympanic membranes normal, insufflation normal. Nose: normal Throat: normal Neck & thyroid: normal  CHEST & LUNGS: Chest wall: normal Lungs: good air entry. End expiratory wheezes in the lower half of the lungs. No Rales.  CVS: Reveals the PMI to be normally located. Regular rhythm, First and Second Heart sounds are normal,  absence of murmurs, rubs or gallops. Peripheral vasculature: Radial pulses: normal Dorsal pedis pulses: normal Posterior pulses: normal  ABDOMEN:  Appearance: Obese Benign, no organomegaly, no masses, no Abdominal Aortic enlargement. No Guarding , no rebound. No Bruits. Bowel sounds: normal  RECTAL: N/A GU: N/A  EXTREMETIES: nonedematous.  MUSCULOSKELETAL:  Spine: normal Joints: intact  NEUROLOGIC: oriented to time,place and person; nonfocal. Cranial Nerves are normal. Results for orders placed in visit on 08/02/13  CMP14+EGFR      Result Value Ref Range   Glucose 104 (*) 65 - 99 mg/dL   BUN 10  8 - 27 mg/dL   Creatinine, Ser 0.74  0.57 - 1.00 mg/dL   GFR calc non Af Amer 82  >59 mL/min/1.73   GFR calc Af Amer 94  >59 mL/min/1.73   BUN/Creatinine Ratio 14  11 - 26   Sodium 143  134 - 144 mmol/L   Potassium 4.4  3.5 - 5.2 mmol/L   Chloride 102  97 - 108 mmol/L   CO2 25  18 - 29 mmol/L   Calcium 10.4 (*) 8.7 - 10.3 mg/dL   Total Protein 7.1  6.0 - 8.5 g/dL   Albumin 4.4  3.5 -  4.8 g/dL   Globulin, Total 2.7  1.5 - 4.5 g/dL   Albumin/Globulin Ratio 1.6  1.1 - 2.5   Total Bilirubin 0.6  0.0 - 1.2 mg/dL   Alkaline Phosphatase 118 (*) 39 - 117 IU/L   AST 39  0 - 40 IU/L   ALT 64 (*) 0 - 32 IU/L  LIPID PANEL      Result Value Ref Range   Cholesterol, Total 105  100 - 199 mg/dL   Triglycerides 187 (*) 0 - 149 mg/dL   HDL  34 (*) >39 mg/dL   VLDL Cholesterol Cal 37  5 - 40 mg/dL   LDL Calculated 34  0 - 99 mg/dL   Chol/HDL Ratio 3.1  0.0 - 4.4 ratio units  LIPASE      Result Value Ref Range   Lipase 42  0 - 59 U/L  TSH      Result Value Ref Range   TSH 6.940 (*) 0.450 - 4.500 uIU/mL  PROTIME-INR      Result Value Ref Range   INR 1.1  0.8 - 1.2   Prothrombin Time 11.3  9.1 - 12.0 sec  ANTI-SMOOTH MUSCLE ANTIBODY, IGG      Result Value Ref Range   Smooth Muscle Ab 6  <20 U  POCT CBC      Result Value Ref Range   WBC 6.0  4.6 - 10.2 K/uL   Lymph, poc 2.4  0.6 - 3.4   POC LYMPH PERCENT 39.4  10 - 50 %L   MID (cbc)    0 - 0.9   POC MID %    0 - 12 %M   POC Granulocyte 3.1  2 - 6.9   Granulocyte percent 50.9  37 - 80 %G   RBC 4.6  4.04 - 5.48 M/uL   Hemoglobin 13.3  12.2 - 16.2 g/dL   HCT, POC 41.7  37.7 - 47.9 %   MCV 90.2  80 - 97 fL   MCH, POC 28.7  27 - 31.2 pg   MCHC 31.8  31.8 - 35.4 g/dL   RDW, POC 13.9     Platelet Count, POC 327.0  142 - 424 K/uL   MPV 7.9  0 - 99.8 fL    ASSESSMENT:  OBSTRUCTIVE SLEEP APNEA  Obesity  Hypothyroid  HYPERTENSION  Hyperglycemia  Hyperlipidemia  Vitamin D deficiency  Acute bronchitis  Wheezing - Plan: beclomethasone (QVAR) 40 MCG/ACT inhaler  Patient experiencing allergy related asthma. Moderate intermittent. Will add a steroid inhaler  PLAN: Handout on Asthma in the AVS   No orders of the defined types were placed in this encounter.   Meds ordered this encounter  Medications  . DISCONTD: amLODipine (NORVASC) 5 MG tablet    Sig:   . albuterol (PROVENTIL HFA;VENTOLIN HFA) 108 (90 BASE) MCG/ACT inhaler    Sig: Inhale 2 puffs into the lungs every 6 (six) hours as needed for wheezing or shortness of breath.  . beclomethasone (QVAR) 40 MCG/ACT inhaler    Sig: Inhale 2 puffs into the lungs 2 (two) times daily.    Dispense:  1 Inhaler    Refill:  3   Medications Discontinued During This Encounter  Medication Reason  . amLODipine  (NORVASC) 5 MG tablet Discontinued by provider  . albuterol (VENTOLIN HFA) 108 (90 BASE) MCG/ACT inhaler    Return in about 2 months (around 11/03/2013) for Recheck medical problems. consider PFTs if indicated.compliance and use of the albuterol inhaler.  Dub Mikes  Darylene Price, M.D.

## 2013-09-03 NOTE — Patient Instructions (Signed)
Asthma, Adult Asthma is a recurring condition in which the airways tighten and narrow. Asthma can make it difficult to breathe. It can cause coughing, wheezing, and shortness of breath. Asthma episodes (also called asthma attacks) range from minor to life-threatening. Asthma cannot be cured, but medicines and lifestyle changes can help control it. CAUSES Asthma is believed to be caused by inherited (genetic) and environmental factors, but its exact cause is unknown. Asthma may be triggered by allergens, lung infections, or irritants in the air. Asthma triggers are different for each person. Common triggers include:   Animal dander.  Dust mites.  Cockroaches.  Pollen from trees or grass.  Mold.  Smoke.  Air pollutants such as dust, household cleaners, hair sprays, aerosol sprays, paint fumes, strong chemicals, or strong odors.  Cold air, weather changes, and winds (which increase molds and pollens in the air).  Strong emotional expressions such as crying or laughing hard.  Stress.  Certain medicines (such as aspirin) or types of drugs (such as beta-blockers).  Sulfites in foods and drinks. Foods and drinks that may contain sulfites include dried fruit, potato chips, and sparkling grape juice.  Infections or inflammatory conditions such as the flu, a cold, or an inflammation of the nasal membranes (rhinitis).  Gastroesophageal reflux disease (GERD).  Exercise or strenuous activity. SYMPTOMS Symptoms may occur immediately after asthma is triggered or many hours later. Symptoms include:  Wheezing.  Excessive nighttime or early morning coughing.  Frequent or severe coughing with a common cold.  Chest tightness.  Shortness of breath. DIAGNOSIS  The diagnosis of asthma is made by a review of your medical history and a physical exam. Tests may also be performed. These may include:  Lung function studies. These tests show how much air you breath in and out.  Allergy  tests.  Imaging tests such as X-rays. TREATMENT  Asthma cannot be cured, but it can usually be controlled. Treatment involves identifying and avoiding your asthma triggers. It also involves medicines. There are 2 classes of medicine used for asthma treatment:   Controller medicines. These prevent asthma symptoms from occurring. They are usually taken every day.  Reliever or rescue medicines. These quickly relieve asthma symptoms. They are used as needed and provide short-term relief. Your health care provider will help you create an asthma action plan. An asthma action plan is a written plan for managing and treating your asthma attacks. It includes a list of your asthma triggers and how they may be avoided. It also includes information on when medicines should be taken and when their dosage should be changed. An action plan may also involve the use of a device called a peak flow meter. A peak flow meter measures how well the lungs are working. It helps you monitor your condition. HOME CARE INSTRUCTIONS   Take medicine as directed by your health care provider. Speak with your health care provider if you have questions about how or when to take the medicines.  Use a peak flow meter as directed by your health care provider. Record and keep track of readings.  Understand and use the action plan to help minimize or stop an asthma attack without needing to seek medical care.  Control your home environment in the following ways to help prevent asthma attacks:  Do not smoke. Avoid being exposed to secondhand smoke.  Change your heating and air conditioning filter regularly.  Limit your use of fireplaces and wood stoves.  Get rid of pests (such as roaches and   mice) and their droppings.  Throw away plants if you see mold on them.  Clean your floors and dust regularly. Use unscented cleaning products.  Try to have someone else vacuum for you regularly. Stay out of rooms while they are being  vacuumed and for a short while afterward. If you vacuum, use a dust mask from a hardware store, a double-layered or microfilter vacuum cleaner bag, or a vacuum cleaner with a HEPA filter.  Replace carpet with wood, tile, or vinyl flooring. Carpet can trap dander and dust.  Use allergy-proof pillows, mattress covers, and box spring covers.  Wash bed sheets and blankets every week in hot water and dry them in a dryer.  Use blankets that are made of polyester or cotton.  Clean bathrooms and kitchens with bleach. If possible, have someone repaint the walls in these rooms with mold-resistant paint. Keep out of the rooms that are being cleaned and painted.  Wash hands frequently. SEEK MEDICAL CARE IF:   You have wheezing, shortness of breath, or a cough even if taking medicine to prevent attacks.  The colored mucus you cough up (sputum) is thicker than usual.  Your sputum changes from clear or white to yellow, green, gray, or bloody.  You have any problems that may be related to the medicines you are taking (such as a rash, itching, swelling, or trouble breathing).  You are using a reliever medicine more than 2 3 times per week.  Your peak flow is still at 50 79% of you personal best after following your action plan for 1 hour. SEEK IMMEDIATE MEDICAL CARE IF:   You seem to be getting worse and are unresponsive to treatment during an asthma attack.  You are short of breath even at rest.  You get short of breath when doing very little physical activity.  You have difficulty eating, drinking, or talking due to asthma symptoms.  You develop chest pain.  You develop a fast heartbeat.  You have a bluish color to your lips or fingernails.  You are lightheaded, dizzy, or faint.  Your peak flow is less than 50% of your personal best.  You have a fever or persistent symptoms for more than 2 3 days.  You have a fever and symptoms suddenly get worse. MAKE SURE YOU:   Understand these  instructions.  Will watch your condition.  Will get help right away if you are not doing well or get worse. Document Released: 04/26/2005 Document Revised: 12/27/2012 Document Reviewed: 11/23/2012 [employer] Patient Information 2014 [employer].  

## 2013-10-23 ENCOUNTER — Other Ambulatory Visit: Payer: Self-pay | Admitting: *Deleted

## 2013-10-23 MED ORDER — FENOFIBRATE 48 MG PO TABS
48.0000 mg | ORAL_TABLET | Freq: Every day | ORAL | Status: DC
Start: 2013-10-23 — End: 2014-02-19

## 2013-10-30 ENCOUNTER — Other Ambulatory Visit: Payer: Self-pay | Admitting: Family Medicine

## 2013-11-05 ENCOUNTER — Encounter: Payer: Self-pay | Admitting: Family

## 2013-11-05 ENCOUNTER — Ambulatory Visit (INDEPENDENT_AMBULATORY_CARE_PROVIDER_SITE_OTHER): Payer: Medicare Other | Admitting: Family

## 2013-11-05 VITALS — BP 142/88 | HR 68 | Temp 98.0°F | Ht 64.0 in | Wt 250.0 lb

## 2013-11-05 DIAGNOSIS — K625 Hemorrhage of anus and rectum: Secondary | ICD-10-CM

## 2013-11-05 DIAGNOSIS — E785 Hyperlipidemia, unspecified: Secondary | ICD-10-CM

## 2013-11-05 DIAGNOSIS — E559 Vitamin D deficiency, unspecified: Secondary | ICD-10-CM

## 2013-11-05 DIAGNOSIS — E038 Other specified hypothyroidism: Secondary | ICD-10-CM

## 2013-11-05 DIAGNOSIS — K219 Gastro-esophageal reflux disease without esophagitis: Secondary | ICD-10-CM | POA: Insufficient documentation

## 2013-11-05 DIAGNOSIS — I10 Essential (primary) hypertension: Secondary | ICD-10-CM

## 2013-11-05 MED ORDER — CLOTRIMAZOLE-BETAMETHASONE 1-0.05 % EX CREA: 1.0000 "application " | TOPICAL_CREAM | Freq: Two times a day (BID) | CUTANEOUS | Status: DC

## 2013-11-05 MED ORDER — LEVOTHYROXINE SODIUM 175 MCG PO TABS: ORAL_TABLET | ORAL | Status: DC

## 2013-11-05 MED ORDER — LOSARTAN POTASSIUM-HCTZ 100-25 MG PO TABS: 1.0000 | ORAL_TABLET | Freq: Every day | ORAL | Status: DC

## 2013-11-05 NOTE — Progress Notes (Signed)
Subjective:    Patient ID: Karen Potter, female    DOB: [birth year], 73 y.o.   MRN: [id number]  Rectal Bleeding  The current episode started more than 2 weeks ago. The onset was sudden. The problem occurs frequently. The problem has been unchanged. The patient is experiencing no pain. The stool is described as soft. Prior successful therapies include stool softeners. Associated symptoms include diarrhea. Pertinent negatives include no anorexia, no abdominal pain, no vomiting, no chest pain and no headaches.  Hyperlipidemia This is a chronic problem. The current episode started more than 1 year ago. The problem is controlled. Recent lipid tests were reviewed and are high. Exacerbating diseases include hypothyroidism and obesity. She has no history of diabetes. Factors aggravating her hyperlipidemia include fatty foods. Associated symptoms include shortness of breath. Pertinent negatives include no chest pain, leg pain or myalgias. Current antihyperlipidemic treatment includes fibric acid derivatives. The current treatment provides mild improvement of lipids. Risk factors for coronary artery disease include diabetes mellitus, dyslipidemia, hypertension, obesity, post-menopausal and family history.  Hypertension This is a chronic problem. The current episode started more than 1 year ago. The problem has been waxing and waning since onset. The problem is uncontrolled. Associated symptoms include shortness of breath. Pertinent negatives include no chest pain, headaches, palpitations or peripheral edema. Risk factors for coronary artery disease include diabetes mellitus, dyslipidemia, family history, obesity and post-menopausal state. Past treatments include diuretics and beta blockers. The current treatment provides moderate improvement. Hypertensive end-organ damage includes a thyroid problem. There is no history of kidney disease, CAD/MI or heart failure.  Thyroid Problem Presents for follow-up visit.  Symptoms include diarrhea and fatigue. Patient reports no anxiety, constipation, hair loss or palpitations. The symptoms have been stable. Past treatments include levothyroxine. The treatment provided significant relief. Her past medical history is significant for hyperlipidemia. There is no history of diabetes or heart failure.  Gastrophageal Reflux She reports no abdominal pain, no chest pain, no choking, no heartburn or no sore throat. This is a chronic problem. The current episode started more than 1 year ago. The problem occurs rarely. The problem has been resolved. The symptoms are aggravated by certain foods. Associated symptoms include fatigue. She has tried a PPI for the symptoms. The treatment provided significant relief.   *Pt states she is not a diabetic, but has hyperglycemia occasionally and checks her blood sugars at home.     Review of Systems  Constitutional: Positive for fatigue.  HENT: Negative.  Negative for sore throat.   Respiratory: Positive for shortness of breath. Negative for choking.   Cardiovascular: Negative.  Negative for chest pain and palpitations.  Gastrointestinal: Positive for diarrhea and hematochezia. Negative for heartburn, vomiting, abdominal pain, constipation and anorexia.  Genitourinary: Negative.   Musculoskeletal: Negative.  Negative for myalgias.  Neurological: Negative for headaches.  Hematological: Negative.   All other systems reviewed and are negative.      Objective:   Physical Exam  Vitals reviewed. Constitutional: She is oriented to person, place, and time. She appears well-developed and well-nourished. No distress.  HENT:  Head: Normocephalic and atraumatic.  Right Ear: External ear normal.  Mouth/Throat: Oropharynx is clear and moist.  Eyes: Pupils are equal, round, and reactive to light.  Neck: Normal range of motion. Neck supple. No thyromegaly present.  Cardiovascular: Normal rate, regular rhythm, normal heart sounds and intact  distal pulses.   No murmur heard. Pulmonary/Chest: Effort normal and breath sounds normal. No respiratory distress. She has no  wheezes.  Abdominal: Soft. Bowel sounds are normal. She exhibits no distension. There is no tenderness.  Musculoskeletal: Normal range of motion. She exhibits no edema and no tenderness.  Neurological: She is alert and oriented to person, place, and time. She has normal reflexes. No cranial nerve deficit.  Skin: Skin is warm and dry.  Psychiatric: She has a normal mood and affect. Her behavior is normal. Judgment and thought content normal.      BP 142/88  Pulse 68  Temp(Src) 98 F (36.7 C) (Oral)  Ht '5\' 4"'  (1.626 m)  Wt 250 lb (113.399 kg)  BMI 42.89 kg/m2     Assessment & Plan:  1. HYPERTENSION -Losartan-hydrochlorothiazide increased to 100-7m from 100-12.5 mg - CMP14+EGFR - losartan-hydrochlorothiazide (HYZAAR) 100-25 MG per tablet; Take 1 tablet by mouth daily.  Dispense: 90 tablet; Refill: 3  2. Other specified hypothyroidism - Thyroid Panel With TSH - levothyroxine (SYNTHROID, LEVOTHROID) 175 MCG tablet; TAKE ONE TABLET BY MOUTH EVERY DAY  Dispense: 90 tablet; Refill: 3  3. Hyperlipidemia - Lipid panel  4. Vitamin D deficiency - Vit D  25 hydroxy (rtn osteoporosis monitoring)  5. Gastroesophageal reflux disease without esophagitis  6. Rectal bleeding - Anemia Profile B - Ambulatory referral to Gastroenterology   Continue all meds Labs pending Health Maintenance reviewed Diet and exercise encouraged RTO 3 months  CEvelina Dun FNP

## 2013-11-05 NOTE — Patient Instructions (Signed)
Rectal Bleeding °Rectal bleeding is when blood passes out of the anus. It is usually a sign that something is wrong. It may not be serious, but it should always be evaluated. Rectal bleeding may present as bright red blood or extremely dark stools. The color may range from dark red or maroon to black (like tar). It is important that the cause of rectal bleeding be identified so treatment can be started and the problem corrected. °CAUSES  °· Hemorrhoids. These are enlarged (dilated) blood vessels or veins in the anal or rectal area. °· Fistulas. These are abnormal, burrowing channels that usually run from inside the rectum to the skin around the anus. They can bleed. °· Anal fissures. This is a tear in the tissue of the anus. Bleeding occurs with bowel movements. °· Diverticulosis. This is a condition in which pockets or sacs project from the bowel wall. Occasionally, the sacs can bleed. °· Diverticulitis. This is an infection involving diverticulosis of the colon. °· Proctitis and colitis. These are conditions in which the rectum, colon, or both, can become inflamed and pitted (ulcerated). °· Polyps and cancer. Polyps are non-cancerous (benign) growths in the colon that may bleed. Certain types of polyps turn into cancer. °· Protrusion of the rectum. Part of the rectum can project from the anus and bleed. °· Certain medicines. °· Intestinal infections. °· Blood vessel abnormalities. °HOME CARE INSTRUCTIONS °· Eat a high-fiber diet to keep your stool soft. °· Limit activity. °· Drink enough fluids to keep your urine clear or pale yellow. °· Warm baths may be useful to soothe rectal pain. °· Follow up with your caregiver as directed. °SEEK IMMEDIATE MEDICAL CARE IF: °· You develop increased bleeding. °· You have black or dark red stools. °· You vomit blood or material that looks like coffee grounds. °· You have abdominal pain or tenderness. °· You have a fever. °· You feel weak, nauseous, or you faint. °· You have  severe rectal pain or you are unable to have a bowel movement. °MAKE SURE YOU: °· Understand these instructions. °· Will watch your condition. °· Will get help right away if you are not doing well or get worse. °Document Released: 10/16/2001 Document Revised: 07/19/2011 Document Reviewed: 10/11/2010 °ExitCare® Patient Information ©2015 [employer]. This information is not intended to replace advice given to you by your health care provider. Make sure you discuss any questions you have with your health care provider. ° °

## 2013-11-06 LAB — ANEMIA PROFILE B
Basophils Absolute: 0.1 10*3/uL (ref 0.0–0.2)
Basos: 1 %
Eos: 4 %
Eosinophils Absolute: 0.2 10*3/uL (ref 0.0–0.4)
Ferritin: 67 ng/mL (ref 15–150)
Folate: 13.5 ng/mL (ref >3.0)
HCT: 40.8 % (ref 34.0–46.6)
Hemoglobin: 13.3 g/dL (ref 11.1–15.9)
Immature Grans (Abs): 0 10*3/uL (ref 0.0–0.1)
Immature Granulocytes: 0 %
Iron Saturation: 32 % (ref 15–55)
Iron: 115 ug/dL (ref 35–155)
Lymphocytes Absolute: 2.3 10*3/uL (ref 0.7–3.1)
Lymphs: 43 %
MCH: 29.7 pg (ref 26.6–33.0)
MCHC: 32.6 g/dL (ref 31.5–35.7)
MCV: 91 fL (ref 79–97)
Monocytes Absolute: 0.8 10*3/uL (ref 0.1–0.9)
Monocytes: 16 %
Neutrophils Absolute: 1.9 10*3/uL (ref 1.4–7.0)
Neutrophils Relative %: 36 %
Platelets: 276 10*3/uL (ref 150–379)
RBC: 4.48 x10E6/uL (ref 3.77–5.28)
RDW: 14.2 % (ref 12.3–15.4)
Retic Ct Pct: 1.1 % (ref 0.6–2.6)
TIBC: 360 ug/dL (ref 250–450)
UIBC: 245 ug/dL (ref 150–375)
Vitamin B-12: 298 pg/mL (ref 211–946)
WBC: 5.3 10*3/uL (ref 3.4–10.8)

## 2013-11-06 LAB — CMP14+EGFR
ALT: 56 IU/L — ABNORMAL HIGH (ref 0–32)
AST: 35 IU/L (ref 0–40)
Albumin/Globulin Ratio: 1.8 (ref 1.1–2.5)
Albumin: 4.3 g/dL (ref 3.5–4.8)
Alkaline Phosphatase: 86 IU/L (ref 39–117)
BUN/Creatinine Ratio: 17 (ref 11–26)
BUN: 14 mg/dL (ref 8–27)
CO2: 24 mmol/L (ref 18–29)
Calcium: 10.7 mg/dL — ABNORMAL HIGH (ref 8.7–10.3)
Chloride: 101 mmol/L (ref 97–108)
Creatinine, Ser: 0.84 mg/dL (ref 0.57–1.00)
GFR calc Af Amer: 81 mL/min/{1.73_m2} (ref >59)
GFR calc non Af Amer: 70 mL/min/{1.73_m2} (ref >59)
Globulin, Total: 2.4 g/dL (ref 1.5–4.5)
Glucose: 122 mg/dL — ABNORMAL HIGH (ref 65–99)
Potassium: 4.3 mmol/L (ref 3.5–5.2)
Sodium: 141 mmol/L (ref 134–144)
Total Bilirubin: 0.5 mg/dL (ref 0.0–1.2)
Total Protein: 6.7 g/dL (ref 6.0–8.5)

## 2013-11-06 LAB — LIPID PANEL
Chol/HDL Ratio: 4.3 ratio units (ref 0.0–4.4)
Cholesterol, Total: 139 mg/dL (ref 100–199)
HDL: 32 mg/dL — ABNORMAL LOW (ref >39)
LDL Calculated: 53 mg/dL (ref 0–99)
Triglycerides: 271 mg/dL — ABNORMAL HIGH (ref 0–149)
VLDL Cholesterol Cal: 54 mg/dL — ABNORMAL HIGH (ref 5–40)

## 2013-11-06 LAB — THYROID PANEL WITH TSH
Free Thyroxine Index: 2.5 (ref 1.2–4.9)
T3 Uptake Ratio: 25 % (ref 24–39)
T4, Total: 10.1 ug/dL (ref 4.5–12.0)
TSH: 1.4 u[IU]/mL (ref 0.450–4.500)

## 2013-11-06 LAB — VITAMIN D 25 HYDROXY (VIT D DEFICIENCY, FRACTURES): Vit D, 25-Hydroxy: 36.5 ng/mL (ref 30.0–100.0)

## 2013-11-07 ENCOUNTER — Other Ambulatory Visit: Payer: Self-pay | Admitting: Family

## 2013-11-14 ENCOUNTER — Telehealth: Payer: Self-pay | Admitting: Family Medicine

## 2013-11-14 NOTE — Telephone Encounter (Signed)
Her symptoms were better and she did not feel that she needed to be seen. She is having very little rectal bleeding now. She is having blood only when she wipes. She wants to know about the fish oil that Caryl Pina told her to take. She had been told in the past not to take it but she does not remember why. Please call her back about this issue. Call her cell [phone].

## 2013-11-14 NOTE — Telephone Encounter (Signed)
Please find out why the patient canceled the appointment

## 2013-12-21 ENCOUNTER — Other Ambulatory Visit: Payer: Self-pay | Admitting: Family Medicine

## 2014-02-01 ENCOUNTER — Other Ambulatory Visit: Payer: Self-pay | Admitting: Family

## 2014-02-05 ENCOUNTER — Ambulatory Visit: Payer: Medicare Other | Admitting: Family Medicine

## 2014-02-19 ENCOUNTER — Ambulatory Visit (INDEPENDENT_AMBULATORY_CARE_PROVIDER_SITE_OTHER): Payer: Medicare Other | Admitting: Family Medicine

## 2014-02-19 ENCOUNTER — Encounter: Payer: Self-pay | Admitting: Family Medicine

## 2014-02-19 VITALS — BP 129/75 | HR 66 | Temp 97.1°F | Ht 64.0 in | Wt 250.0 lb

## 2014-02-19 DIAGNOSIS — E038 Other specified hypothyroidism: Secondary | ICD-10-CM

## 2014-02-19 DIAGNOSIS — I10 Essential (primary) hypertension: Secondary | ICD-10-CM

## 2014-02-19 DIAGNOSIS — R739 Hyperglycemia, unspecified: Secondary | ICD-10-CM

## 2014-02-19 DIAGNOSIS — Z23 Encounter for immunization: Secondary | ICD-10-CM

## 2014-02-19 LAB — POCT GLYCOSYLATED HEMOGLOBIN (HGB A1C): Hemoglobin A1C: 6.4

## 2014-02-19 NOTE — Progress Notes (Signed)
   Subjective:    Patient ID: Karen Potter, female    DOB: 1941/01/03, 73 y.o.   MRN: [id number]  HPI  73 year old female here to followup hypertension hypothyroidism, sleep apnea, and hyperglycemia. Her asthma seems to come and go she does not use an inhaler on a regular basis. Current Lopressor pills include carvedilol and losartan. His single work well without side effects. Reviewing her labs from last visit, her triglycerides are elevated but she was taken off of fibratesdue to liver enzyme elevation.    Review of Systems  Respiratory: Positive for cough and shortness of breath.   Gastrointestinal: Positive for abdominal pain.  Musculoskeletal: Positive for back pain and gait problem.  All other systems reviewed and are negative.      Objective:   Physical Exam  Constitutional: She is oriented to person, place, and time. She appears well-developed and well-nourished.  HENT:  Head: Normocephalic.  Eyes: Pupils are equal, round, and reactive to light.  Cardiovascular: Normal rate and regular rhythm.   Pulmonary/Chest: Effort normal. She has wheezes.  Wheezes or upper airway as opposed to lower  Abdominal: Soft.  Musculoskeletal: Normal range of motion.  Neurological: She is alert and oriented to person, place, and time. She has normal reflexes.  Psychiatric: She has a normal mood and affect.   BP 129/75  Pulse 66  Temp(Src) 97.1 F (36.2 C) (Oral)  Ht _0  (1.626 m)  Wt 250 lb (113.399 kg)  BMI 42.89 kg/m2       Assessment & Plan:  1. Essential hypertension  - BMP8+EGFR  2. Other specified hypothyroidism   3. Hypercalcemia   4. Hyperglycemia  - POCT glycosylated hemoglobin (Hb A1C) If FBS is elevated, need to consider metformin  Wardell Honour MD

## 2014-02-19 NOTE — Progress Notes (Signed)
Subjective:    Patient ID: Karen Potter, female    DOB: 22-Sep-1940, 73 y.o.   MRN: [id number]  HPI   Patient Active Problem List   Diagnosis Date Noted  . GERD (gastroesophageal reflux disease) 11/05/2013  . Fatty liver 06/01/2013  . Hypercalcemia 05/14/2013  . Abnormal transaminases 05/14/2013  . Hot flashes 05/14/2013  . Hyperglycemia   . Hyperlipidemia   . Hypertension   . Hypothyroid   . Migraine   . Polio   . Amnesia   . Seasonal allergies   . Vitamin D deficiency   . Obesity   . Aphasia 04/11/2012  . TIA (transient ischemic attack) 04/11/2012  . 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 OBSTRUCTIVE SLEEP APNEA 05/31/2008  . HYPERTENSION 05/31/2008  . OVARIAN CYST 05/31/2008  . CHOLECYSTECTOMY, HX OF 05/31/2008  . DYSPNEA 06/01/2007  . CHEST PAIN 06/01/2007   Outpatient Encounter Prescriptions as of 02/19/2014  Medication Sig  . aspirin 325 MG tablet Take 325 mg by mouth daily.  . carvedilol (COREG) 6.25 MG tablet TAKE ONE TABLET BY MOUTH TWICE DAILY WITH A MEAL  . Cholecalciferol (VITAMIN D3) 2000 UNITS capsule Take 2,000 Units by mouth daily.  . clotrimazole-betamethasone (LOTRISONE) cream Apply 1 application topically 2 (two) times daily.  . Docosahexaenoic Acid (DHA PO) Take 1 capsule by mouth daily.  Marland Kitchen guaiFENesin (MUCINEX) 600 MG 12 hr tablet Take 600 mg by mouth 2 (two) times daily as needed.  . Lancets Misc. (ONE TOUCH SURESOFT) MISC ONE  EVERY DAY  . levothyroxine (SYNTHROID, LEVOTHROID) 150 MCG tablet Take 150 mcg by mouth once a week. Friday  . levothyroxine (SYNTHROID, LEVOTHROID) 175 MCG tablet Take 175 mcg by mouth daily before breakfast. Saturday through Thursday  . losartan-hydrochlorothiazide (HYZAAR) 100-25 MG per tablet Take 1 tablet by mouth daily.  Marland Kitchen omeprazole (PRILOSEC) 20 MG capsule Take 20 mg by mouth daily.  Glory Rosebush VERIO test strip USE ONE  EVERY DAY TO CHECK BLOOD SUGAR LEVELS  . Probiotic Product (RESTORA PO) Take 1 capsule by mouth daily.    . [DISCONTINUED] levothyroxine (SYNTHROID, LEVOTHROID) 175 MCG tablet TAKE ONE TABLET BY MOUTH EVERY DAY  . [DISCONTINUED] Multiple Vitamin (MULTIVITAMIN WITH MINERALS) TABS Take 1 tablet by mouth daily. Women's One A Day  . albuterol (PROVENTIL HFA;VENTOLIN HFA) 108 (90 BASE) MCG/ACT inhaler Inhale 2 puffs into the lungs every 6 (six) hours as needed for wheezing or shortness of breath.  . beclomethasone (QVAR) 40 MCG/ACT inhaler Inhale 2 puffs into the lungs 2 (two) times daily.  . [DISCONTINUED] Casanthranol-Docusate Sodium 30-100 MG CAPS Take by mouth.  . [DISCONTINUED] clobetasol ointment (TEMOVATE) 7.37 % Apply 1 application topically 2 (two) times daily.  . [DISCONTINUED] dextromethorphan (DELSYM) 30 MG/5ML liquid Take by mouth as needed for cough.  . [DISCONTINUED] diphenoxylate-atropine (LOMOTIL) 2.5-0.025 MG per tablet Take by mouth 4 (four) times daily as needed for diarrhea or loose stools.  . [DISCONTINUED] Docosahexaenoic Acid (DHA COMPLETE PO) Take 1 tablet by mouth daily.  . [DISCONTINUED] fenofibrate (TRICOR) 48 MG tablet Take 1 tablet (48 mg total) by mouth daily.  . [DISCONTINUED] Magnesium Chloride (SLOW-MAG PO) Take 1 tablet by mouth.  . [DISCONTINUED] metoprolol tartrate (LOPRESSOR) 25 MG tablet      Review of Systems     Objective:   Physical Exam BP 129/75  Pulse 66  Temp(Src) 97.1 F (36.2 C) (Oral)  Ht 5\' 4"  (1.626 m)  Wt 250 lb (113.399 kg)  BMI 42.89 kg/m2  Assessment & Plan:

## 2014-02-20 LAB — BMP8+EGFR
BUN/Creatinine Ratio: 20 (ref 11–26)
BUN: 16 mg/dL (ref 8–27)
CO2: 27 mmol/L (ref 18–29)
Calcium: 10.8 mg/dL — ABNORMAL HIGH (ref 8.7–10.3)
Chloride: 102 mmol/L (ref 97–108)
Creatinine, Ser: 0.82 mg/dL (ref 0.57–1.00)
GFR calc Af Amer: 83 mL/min/{1.73_m2} (ref >59)
GFR calc non Af Amer: 72 mL/min/{1.73_m2} (ref >59)
Glucose: 152 mg/dL — ABNORMAL HIGH (ref 65–99)
Potassium: 5.1 mmol/L (ref 3.5–5.2)
Sodium: 142 mmol/L (ref 134–144)

## 2014-02-23 ENCOUNTER — Other Ambulatory Visit: Payer: Self-pay | Admitting: Family

## 2014-04-02 ENCOUNTER — Encounter (HOSPITAL_COMMUNITY): Payer: Self-pay | Admitting: Emergency Medicine

## 2014-04-02 ENCOUNTER — Observation Stay (HOSPITAL_COMMUNITY)
Admission: EM | Admit: 2014-04-02 | Discharge: 2014-04-03 | Disposition: A | Discharge status: Home / Self Care | Payer: Medicare Other | Attending: Family Medicine | Admitting: Family Medicine

## 2014-04-02 ENCOUNTER — Emergency Department (HOSPITAL_COMMUNITY): Payer: Medicare Other

## 2014-04-02 DIAGNOSIS — Z7982 Long term (current) use of aspirin: Secondary | ICD-10-CM | POA: Diagnosis not present

## 2014-04-02 DIAGNOSIS — R079 Chest pain, unspecified: Secondary | ICD-10-CM | POA: Diagnosis not present

## 2014-04-02 DIAGNOSIS — G43909 Migraine, unspecified, not intractable, without status migrainosus: Secondary | ICD-10-CM | POA: Insufficient documentation

## 2014-04-02 DIAGNOSIS — Z8619 Personal history of other infectious and parasitic diseases: Secondary | ICD-10-CM | POA: Insufficient documentation

## 2014-04-02 DIAGNOSIS — Z79899 Other long term (current) drug therapy: Secondary | ICD-10-CM | POA: Insufficient documentation

## 2014-04-02 DIAGNOSIS — E669 Obesity, unspecified: Secondary | ICD-10-CM | POA: Insufficient documentation

## 2014-04-02 DIAGNOSIS — E039 Hypothyroidism, unspecified: Secondary | ICD-10-CM | POA: Diagnosis not present

## 2014-04-02 DIAGNOSIS — E559 Vitamin D deficiency, unspecified: Secondary | ICD-10-CM | POA: Insufficient documentation

## 2014-04-02 DIAGNOSIS — E785 Hyperlipidemia, unspecified: Secondary | ICD-10-CM | POA: Insufficient documentation

## 2014-04-02 DIAGNOSIS — R0602 Shortness of breath: Secondary | ICD-10-CM | POA: Insufficient documentation

## 2014-04-02 DIAGNOSIS — I10 Essential (primary) hypertension: Secondary | ICD-10-CM | POA: Insufficient documentation

## 2014-04-02 DIAGNOSIS — R05 Cough: Secondary | ICD-10-CM | POA: Insufficient documentation

## 2014-04-02 DIAGNOSIS — K760 Fatty (change of) liver, not elsewhere classified: Secondary | ICD-10-CM | POA: Insufficient documentation

## 2014-04-02 DIAGNOSIS — I152 Hypertension secondary to endocrine disorders: Secondary | ICD-10-CM | POA: Diagnosis present

## 2014-04-02 DIAGNOSIS — E1169 Type 2 diabetes mellitus with other specified complication: Secondary | ICD-10-CM | POA: Diagnosis present

## 2014-04-02 DIAGNOSIS — R739 Hyperglycemia, unspecified: Secondary | ICD-10-CM | POA: Diagnosis present

## 2014-04-02 DIAGNOSIS — R059 Cough, unspecified: Secondary | ICD-10-CM

## 2014-04-02 DIAGNOSIS — K219 Gastro-esophageal reflux disease without esophagitis: Secondary | ICD-10-CM | POA: Diagnosis present

## 2014-04-02 DIAGNOSIS — E1159 Type 2 diabetes mellitus with other circulatory complications: Secondary | ICD-10-CM | POA: Diagnosis present

## 2014-04-02 LAB — CBC WITH DIFFERENTIAL/PLATELET
Basophils Absolute: 0 10*3/uL (ref 0.0–0.1)
Basophils Relative: 0 % (ref 0–1)
Eosinophils Absolute: 0.3 10*3/uL (ref 0.0–0.7)
Eosinophils Relative: 3 % (ref 0–5)
HCT: 38.8 % (ref 36.0–46.0)
Hemoglobin: 13.2 g/dL (ref 12.0–15.0)
Lymphocytes Relative: 26 % (ref 12–46)
Lymphs Abs: 2.9 10*3/uL (ref 0.7–4.0)
MCH: 30.6 pg (ref 26.0–34.0)
MCHC: 34 g/dL (ref 30.0–36.0)
MCV: 90 fL (ref 78.0–100.0)
Monocytes Absolute: 1.5 10*3/uL — ABNORMAL HIGH (ref 0.1–1.0)
Monocytes Relative: 13 % — ABNORMAL HIGH (ref 3–12)
Neutro Abs: 6.3 10*3/uL (ref 1.7–7.7)
Neutrophils Relative %: 58 % (ref 43–77)
Platelets: 262 10*3/uL (ref 150–400)
RBC: 4.31 MIL/uL (ref 3.87–5.11)
RDW: 13.9 % (ref 11.5–15.5)
WBC: 10.9 10*3/uL — ABNORMAL HIGH (ref 4.0–10.5)

## 2014-04-02 LAB — BASIC METABOLIC PANEL
Anion gap: 12 (ref 5–15)
BUN: 15 mg/dL (ref 6–23)
CO2: 27 mEq/L (ref 19–32)
Calcium: 10.5 mg/dL (ref 8.4–10.5)
Chloride: 102 mEq/L (ref 96–112)
Creatinine, Ser: 0.81 mg/dL (ref 0.50–1.10)
GFR calc Af Amer: 82 mL/min — ABNORMAL LOW (ref >90)
GFR calc non Af Amer: 71 mL/min — ABNORMAL LOW (ref >90)
Glucose, Bld: 155 mg/dL — ABNORMAL HIGH (ref 70–99)
Potassium: 4.3 mEq/L (ref 3.7–5.3)
Sodium: 141 mEq/L (ref 137–147)

## 2014-04-02 LAB — TROPONIN I: Troponin I: <0.3 ng/mL (ref <0.30)

## 2014-04-02 LAB — D-DIMER, QUANTITATIVE (NOT AT ARMC): D-Dimer, Quant: 0.44 ug/mL-FEU (ref 0.00–0.48)

## 2014-04-02 MED ORDER — NITROGLYCERIN 2 % TD OINT
1.0000 [in_us] | TOPICAL_OINTMENT | Freq: Once | TRANSDERMAL | Status: AC
  Administered 2014-04-02: 1 [in_us] via TOPICAL
  Filled 2014-04-02: qty 1

## 2014-04-02 NOTE — ED Notes (Signed)
Pt c/o increased sob with chest pain when she takes a deep breath. Pt states she has been coughing.

## 2014-04-02 NOTE — ED Provider Notes (Signed)
CSN: [id number]     Arrival date & time 04/02/14  1949 History   First MD Initiated Contact with Patient 04/02/14 2121     Chief Complaint  Patient presents with  . Shortness of Breath  . Chest Pain     (Consider location/radiation/quality/duration/timing/severity/associated sxs/prior Treatment) HPI  This is a 73 year old female with a history of hyperlipidemia, hypertension, obesity who presents with chest pain and shortness of breath. Onset of symptoms after 4 PM. Prior to that she was feeling fine. She reports chest "discomfort" increased shortness of breath, the discomfort radiates upward.  Current pain is 3 out of 10.  She states the pain is worse when she breathes through her mouth and is somewhat pleuritic in nature and denies any association with exertion. She took a full dose aspirin at home. She reports dry cough but no fevers. Prior to 4 4 PM she felt well. Denies any recent hospitalization or surgery. She did go on a cruise early in November. She reports bilateral leg swelling and takes a diuretic. No known history of coronary artery disease but does have a history of stroke.  Past Medical History  Diagnosis Date  . Amnesia   . Polio   . Migraine   . Hyperglycemia   . Hyperlipidemia   . Hypertension   . Hypothyroid   . Seasonal allergies   . Vitamin D deficiency   . Obesity   . Fatty liver    Past Surgical History  Procedure Laterality Date  . Abdominal hysterectomy    . Cholecystectomy    . Carpal tunnel release    . Back surgery    . Knee surgery      right  . Bladder surgery     Family History  Problem Relation Age of Onset  . Heart failure Mother   . Cancer Father   . Cancer Brother    History  Substance Use Topics  . Smoking status: Never Smoker   . Smokeless tobacco: Not on file  . Alcohol Use: No   OB History    No data available     Review of Systems  Constitutional: Negative for fever.  Respiratory: Positive for cough, chest tightness and  shortness of breath.   Cardiovascular: Positive for chest pain.  Gastrointestinal: Negative for nausea, vomiting and abdominal pain.  Genitourinary: Negative for dysuria.  Musculoskeletal: Negative for back pain.  Skin: Negative for wound.  Neurological: Negative for headaches.  Psychiatric/Behavioral: Negative for confusion.  All other systems reviewed and are negative.     Allergies  Codeine; Other; and Doxycycline  Home Medications   Prior to Admission medications   Medication Sig Start Date End Date Taking? Authorizing Provider  albuterol (PROVENTIL HFA;VENTOLIN HFA) 108 (90 BASE) MCG/ACT inhaler Inhale 2 puffs into the lungs every 6 (six) hours as needed for wheezing or shortness of breath. 08/17/13   Lysbeth Penner, FNP  aspirin 325 MG tablet Take 325 mg by mouth daily.    Historical Provider, MD  beclomethasone (QVAR) 40 MCG/ACT inhaler Inhale 2 puffs into the lungs 2 (two) times daily. 09/03/13   Vernie Shanks, MD  carvedilol (COREG) 6.25 MG tablet TAKE ONE TABLET BY MOUTH TWICE DAILY WITH A MEAL 02/04/14   Sharion Balloon, FNP  carvedilol (COREG) 6.25 MG tablet TAKE ONE TABLET BY MOUTH TWICE DAILY WITH A MEAL 02/25/14   Chipper Herb, MD  Cholecalciferol (VITAMIN D3) 2000 UNITS capsule Take 2,000 Units by mouth daily.  Historical Provider, MD  clotrimazole-betamethasone (LOTRISONE) cream Apply 1 application topically 2 (two) times daily. 11/05/13   Sharion Balloon, FNP  Docosahexaenoic Acid (DHA PO) Take 1 capsule by mouth daily.    Historical Provider, MD  guaiFENesin (MUCINEX) 600 MG 12 hr tablet Take 600 mg by mouth 2 (two) times daily as needed.    Historical Provider, MD  Lancets Misc. ([place]) [place] W Moore, MD  levothyroxine (SYNTHROID, LEVOTHROID) 150 MCG tablet Take 150 mcg by mouth once a week. Friday 12/21/12   Lysbeth Penner, FNP  levothyroxine (SYNTHROID, LEVOTHROID) 175 MCG tablet Take 175 mcg by mouth daily before  breakfast. Saturday through Thursday 11/05/13   Sharion Balloon, FNP  losartan-hydrochlorothiazide (HYZAAR) 100-25 MG per tablet Take 1 tablet by mouth daily. 11/05/13   Sharion Balloon, FNP  omeprazole (PRILOSEC) 20 MG capsule Take 20 mg by mouth daily.    Historical Provider, MD  [facility] VERIO test strip USE ONE  EVERY DAY TO CHECK BLOOD SUGAR LEVELS    Chipper Herb, MD  Probiotic Product (RESTORA PO) Take 1 capsule by mouth daily.    Historical Provider, MD   BP 125/73 mmHg  Pulse 79  Temp(Src) 98.9 F (37.2 C) (Oral)  Resp 18  Ht 5' 4.5" (1.638 m)  Wt 250 lb (113.399 kg)  BMI 42.27 kg/m2  SpO2 99% Physical Exam  Constitutional: She is oriented to person, place, and time. No distress.  Obese  HENT:  Head: Normocephalic and atraumatic.  Mouth/Throat: Oropharynx is clear and moist.  Eyes: Pupils are equal, round, and reactive to light.  Neck: Neck supple.  Cardiovascular: Normal rate, regular rhythm and normal heart sounds.   No murmur heard. Pulmonary/Chest: Effort normal. No respiratory distress. She has no wheezes. She exhibits no tenderness.  Abdominal: Soft. Bowel sounds are normal. There is no tenderness. There is no rebound.  Musculoskeletal: She exhibits edema.  Trace bilateral lower extremity edema  Neurological: She is alert and oriented to person, place, and time.  Skin: Skin is warm and dry.  Psychiatric: She has a normal mood and affect.  Nursing note and vitals reviewed.   ED Course  Procedures (including critical care time) Labs Review Labs Reviewed  CBC WITH DIFFERENTIAL - Abnormal; Notable for the following:    WBC 10.9 (*)    Monocytes Relative 13 (*)    Monocytes Absolute 1.5 (*)    All other components within normal limits  BASIC METABOLIC PANEL - Abnormal; Notable for the following:    Glucose, Bld 155 (*)    GFR calc non Af Amer 71 (*)    GFR calc Af Amer 82 (*)    All other components within normal limits  TROPONIN I  D-DIMER, QUANTITATIVE     Imaging Review Dg Chest 2 View  04/02/2014   CLINICAL DATA:  Chest pain  EXAM: CHEST  2 VIEW  COMPARISON:  08/17/2013  FINDINGS: Normal heart size. No pleural effusion or edema. No airspace consolidation. Scar versus platelike atelectasis noted at the left base. There is asymmetric elevation of the right hemidiaphragm. Mild spondylosis identified within the thoracic spine.  IMPRESSION: 1. Scar versus platelike atelectasis in the left base. 2. Asymmetric elevation of the right hemidiaphragm.   Electronically Signed   By: Kerby Moors M.D.   On: 04/02/2014 21:19     EKG Interpretation EKG independently reviewed by myself: Normal sinus rhythm with a rate of  80, right bundle branch block and left anterior fascicular block. No significant change from prior.    MDM   Final diagnoses:  Chest pain  Cough    Patient presents with chest pain and shortness of breath. Onset at 4:00 this afternoon. She is taking a full dose aspirin. Chest pain shortness of breath is pleuritic in nature and somewhat atypical for ACS; however, patient does have multiple risk factors and a heart score of 4. She is nontoxic on exam. There is no reproducible tenderness.  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 X-ray shows no evidence of pneumonia. Troponin and d-dimer initially negative. Patient reports some improvement of chest pain with placement of nitroglycerin paste. Given this and her ACS risk factors, feel it is reasonable to admit for chest pain rule out.  Primary is Redge Gainer.   Merryl Hacker, MD 04/02/14 [phone]

## 2014-04-03 DIAGNOSIS — R079 Chest pain, unspecified: Secondary | ICD-10-CM

## 2014-04-03 DIAGNOSIS — E785 Hyperlipidemia, unspecified: Secondary | ICD-10-CM

## 2014-04-03 DIAGNOSIS — E039 Hypothyroidism, unspecified: Secondary | ICD-10-CM

## 2014-04-03 DIAGNOSIS — K219 Gastro-esophageal reflux disease without esophagitis: Secondary | ICD-10-CM

## 2014-04-03 DIAGNOSIS — I10 Essential (primary) hypertension: Secondary | ICD-10-CM

## 2014-04-03 DIAGNOSIS — R739 Hyperglycemia, unspecified: Secondary | ICD-10-CM

## 2014-04-03 LAB — CBC
HCT: 37.7 % (ref 36.0–46.0)
Hemoglobin: 12.8 g/dL (ref 12.0–15.0)
MCH: 30.5 pg (ref 26.0–34.0)
MCHC: 34 g/dL (ref 30.0–36.0)
MCV: 89.8 fL (ref 78.0–100.0)
Platelets: 254 10*3/uL (ref 150–400)
RBC: 4.2 MIL/uL (ref 3.87–5.11)
RDW: 13.8 % (ref 11.5–15.5)
WBC: 10.1 10*3/uL (ref 4.0–10.5)

## 2014-04-03 LAB — COMPREHENSIVE METABOLIC PANEL
ALT: 49 U/L — ABNORMAL HIGH (ref 0–35)
AST: 31 U/L (ref 0–37)
Albumin: 3.5 g/dL (ref 3.5–5.2)
Alkaline Phosphatase: 88 U/L (ref 39–117)
Anion gap: 15 (ref 5–15)
BUN: 15 mg/dL (ref 6–23)
CO2: 22 mEq/L (ref 19–32)
Calcium: 10.2 mg/dL (ref 8.4–10.5)
Chloride: 101 mEq/L (ref 96–112)
Creatinine, Ser: 0.65 mg/dL (ref 0.50–1.10)
GFR calc Af Amer: >90 mL/min (ref >90)
GFR calc non Af Amer: 87 mL/min — ABNORMAL LOW (ref >90)
Glucose, Bld: 166 mg/dL — ABNORMAL HIGH (ref 70–99)
Potassium: 3.9 mEq/L (ref 3.7–5.3)
Sodium: 138 mEq/L (ref 137–147)
Total Bilirubin: 0.5 mg/dL (ref 0.3–1.2)
Total Protein: 7 g/dL (ref 6.0–8.3)

## 2014-04-03 LAB — HEMOGLOBIN A1C
Hgb A1c MFr Bld: 7.3 % — ABNORMAL HIGH (ref <5.7)
Mean Plasma Glucose: 163 mg/dL — ABNORMAL HIGH (ref <117)

## 2014-04-03 LAB — TROPONIN I
Troponin I: <0.3 ng/mL (ref <0.30)
Troponin I: <0.3 ng/mL (ref <0.30)

## 2014-04-03 MED ORDER — LEVOTHYROXINE SODIUM 75 MCG PO TABS
175.0000 ug | ORAL_TABLET | Freq: Every day | ORAL | Status: DC
  Administered 2014-04-03: 175 ug via ORAL
  Filled 2014-04-03 (×2): qty 1

## 2014-04-03 MED ORDER — HYDROCHLOROTHIAZIDE 25 MG PO TABS
25.0000 mg | ORAL_TABLET | Freq: Every day | ORAL | Status: DC
  Administered 2014-04-03: 25 mg via ORAL
  Filled 2014-04-03: qty 1

## 2014-04-03 MED ORDER — CARVEDILOL 3.125 MG PO TABS
6.2500 mg | ORAL_TABLET | Freq: Two times a day (BID) | ORAL | Status: DC
  Administered 2014-04-03: 6.25 mg via ORAL
  Filled 2014-04-03: qty 2

## 2014-04-03 MED ORDER — ONDANSETRON HCL 4 MG/2ML IJ SOLN: 4.0000 mg | Freq: Four times a day (QID) | INTRAMUSCULAR | Status: DC | PRN

## 2014-04-03 MED ORDER — ONDANSETRON HCL 4 MG PO TABS: 4.0000 mg | ORAL_TABLET | Freq: Four times a day (QID) | ORAL | Status: DC | PRN

## 2014-04-03 MED ORDER — SODIUM CHLORIDE 0.9 % IJ SOLN
3.0000 mL | Freq: Two times a day (BID) | INTRAMUSCULAR | Status: DC
  Administered 2014-04-03: 3 mL via INTRAVENOUS

## 2014-04-03 MED ORDER — SODIUM CHLORIDE 0.9 % IV SOLN: 250.0000 mL | INTRAVENOUS | Status: DC | PRN

## 2014-04-03 MED ORDER — ACETAMINOPHEN 650 MG RE SUPP: 650.0000 mg | Freq: Four times a day (QID) | RECTAL | Status: DC | PRN

## 2014-04-03 MED ORDER — LOSARTAN POTASSIUM 50 MG PO TABS
100.0000 mg | ORAL_TABLET | Freq: Every day | ORAL | Status: DC
Start: 2014-04-03 — End: 2014-04-03
  Administered 2014-04-03: 100 mg via ORAL
  Filled 2014-04-03: qty 2

## 2014-04-03 MED ORDER — SODIUM CHLORIDE 0.9 % IJ SOLN: 3.0000 mL | INTRAMUSCULAR | Status: DC | PRN

## 2014-04-03 MED ORDER — ALBUTEROL SULFATE (2.5 MG/3ML) 0.083% IN NEBU: 3.0000 mL | INHALATION_SOLUTION | Freq: Four times a day (QID) | RESPIRATORY_TRACT | Status: DC | PRN

## 2014-04-03 MED ORDER — PANTOPRAZOLE SODIUM 40 MG PO TBEC
40.0000 mg | DELAYED_RELEASE_TABLET | Freq: Every day | ORAL | Status: DC
Start: 2014-04-03 — End: 2014-04-03
  Administered 2014-04-03: 40 mg via ORAL
  Filled 2014-04-03: qty 1

## 2014-04-03 MED ORDER — ACETAMINOPHEN 325 MG PO TABS: 650.0000 mg | ORAL_TABLET | Freq: Four times a day (QID) | ORAL | Status: DC | PRN

## 2014-04-03 MED ORDER — LOSARTAN POTASSIUM-HCTZ 100-25 MG PO TABS: 1.0000 | ORAL_TABLET | Freq: Every day | ORAL | Status: DC

## 2014-04-03 MED ORDER — ENOXAPARIN SODIUM 40 MG/0.4ML ~~LOC~~ SOLN
40.0000 mg | SUBCUTANEOUS | Status: DC
  Administered 2014-04-03: 40 mg via SUBCUTANEOUS
  Filled 2014-04-03: qty 0.4

## 2014-04-03 MED ORDER — ASPIRIN 325 MG PO TABS
325.0000 mg | ORAL_TABLET | Freq: Every day | ORAL | Status: DC
  Administered 2014-04-03: 325 mg via ORAL
  Filled 2014-04-03: qty 1

## 2014-04-03 NOTE — Discharge Summary (Signed)
Physician Discharge Summary  ATIYA YERA Potter:[id number] DOB: 09/23/1940 DOA: 04/02/2014  PCP: Redge Gainer, MD  Admit date: 04/02/2014 Discharge date: 04/03/2014  Time spent: > 35 minutes  Recommendations for Outpatient Follow-up:  1. Recommend patient follow-up with primary care physician for further evaluation recommendations as listed below. 2. Patient has hyperglycemia please evaluate further. We'll recommend diabetic diet on discharge 3. Please assess fasting lipid profile and decide whether or not patient will require statin or other lipid-lowering medication.  Discharge Diagnoses:    Discharge Condition: Stable  Diet recommendation: Diabetic diet/low carb  Filed Weights   04/02/14 1956 04/03/14 0100  Weight: 113.399 kg (250 lb) 113.8 kg (250 lb 14.1 oz)    History of present illness:  Patient is a 73 year old with history of essential hypertension, GERD, TIA on aspirin 325 mg, hypothyroidism. Who presented complaining of chest discomfort.  Hospital Course:  Principal Problem:   Chest pain - Resolved on day of discharge. -Troponins 3 negative  Active Problems:   Essential hypertension - Patient to continue home regimen low-salt diet  TIA -No new focal neurological symptoms reported. Continue aspirin, blood pressure medication for secondary stroke prevention    Hyperglycemia - Recommend diabetic diet on discharge    Hyperlipidemia - Will need fasting lipid profile    Hypothyroid - Continue home regimen    GERD (gastroesophageal reflux disease) - continue PPI  Procedures:  None  Consultations:  None  Discharge Exam: Filed Vitals:   04/03/14 1023  BP: 115/75  Pulse:   Temp:   Resp:     General: Pt in nad, alert and awake Cardiovascular: rrr, no mrg Respiratory: cta bl, no wheezes  Discharge Instructions You were cared for by a hospitalist during your hospital stay. If you have any questions about your discharge medications or the  care you received while you were in the hospital after you are discharged, you can call the unit and asked to speak with the hospitalist on call if the hospitalist that took care of you is not available. Once you are discharged, your primary care physician will handle any further medical issues. Please note that NO REFILLS for any discharge medications will be authorized once you are discharged, as it is imperative that you return to your primary care physician (or establish a relationship with a primary care physician if you do not have one) for your aftercare needs so that they can reassess your need for medications and monitor your lab values.  Discharge Instructions    Call MD for:  severe uncontrolled pain    Complete by:  As directed      Call MD for:  temperature >100.4    Complete by:  As directed      Diet - low sodium heart healthy    Complete by:  As directed      Discharge instructions    Complete by:  As directed   Please be sure to follow-up with her primary care physician for further evaluation and recommendations.     Increase activity slowly    Complete by:  As directed           Current Discharge Medication List    CONTINUE these medications which have NOT CHANGED   Details  albuterol (PROVENTIL HFA;VENTOLIN HFA) 108 (90 BASE) MCG/ACT inhaler Inhale 2 puffs into the lungs every 6 (six) hours as needed for wheezing or shortness of breath.    aspirin 325 MG tablet Take 325 mg by mouth daily.  carvedilol (COREG) 6.25 MG tablet TAKE ONE TABLET BY MOUTH TWICE DAILY WITH A MEAL Qty: 60 tablet, Refills: 0    Cholecalciferol (VITAMIN D3) 2000 UNITS capsule Take 2,000 Units by mouth daily.    clotrimazole-betamethasone (LOTRISONE) cream Apply 1 application topically 2 (two) times daily. Qty: 30 g, Refills: 11    Docosahexaenoic Acid (DHA PO) Take 1 capsule by mouth 2 (two) times daily.     guaiFENesin (MUCINEX) 600 MG 12 hr tablet Take 600 mg by mouth 2 (two) times daily  as needed.    !! levothyroxine (SYNTHROID, LEVOTHROID) 150 MCG tablet Take 150 mcg by mouth once a week. Friday    !! levothyroxine (SYNTHROID, LEVOTHROID) 175 MCG tablet Take 175 mcg by mouth daily before breakfast. Saturday through Thursday    losartan-hydrochlorothiazide (HYZAAR) 100-25 MG per tablet Take 1 tablet by mouth daily. Qty: 90 tablet, Refills: 3   Associated Diagnoses: Unspecified essential hypertension    omeprazole (PRILOSEC) 20 MG capsule Take 20 mg by mouth daily.     !! - Potential duplicate medications found. Please discuss with provider.     Allergies  Allergen Reactions  . Codeine Itching  . Other Itching    Most pain meds cause itching.  When has to take pain medication, she has been instructed to take Benadryl  . Doxycycline Rash      The results of significant diagnostics from this hospitalization (including imaging, microbiology, ancillary and laboratory) are listed below for reference.    Significant Diagnostic Studies: Dg Chest 2 View  04/02/2014   CLINICAL DATA:  Chest pain  EXAM: CHEST  2 VIEW  COMPARISON:  08/17/2013  FINDINGS: Normal heart size. No pleural effusion or edema. No airspace consolidation. Scar versus platelike atelectasis noted at the left base. There is asymmetric elevation of the right hemidiaphragm. Mild spondylosis identified within the thoracic spine.  IMPRESSION: 1. Scar versus platelike atelectasis in the left base. 2. Asymmetric elevation of the right hemidiaphragm.   Electronically Signed   By: Kerby Moors M.D.   On: 04/02/2014 21:19    Microbiology: No results found for this or any previous visit (from the past 240 hour(s)).   Labs: Basic Metabolic Panel:  Recent Labs Lab 04/02/14 2043 04/03/14 0229  NA 141 138  K 4.3 3.9  CL 102 101  CO2 27 22  GLUCOSE 155* 166*  BUN 15 15  CREATININE 0.81 0.65  CALCIUM 10.5 10.2   Liver Function Tests:  Recent Labs Lab 04/03/14 0229  AST 31  ALT 49*  ALKPHOS 88   BILITOT 0.5  PROT 7.0  ALBUMIN 3.5   No results for input(s): LIPASE, AMYLASE in the last 168 hours. No results for input(s): AMMONIA in the last 168 hours. CBC:  Recent Labs Lab 04/02/14 2043 04/03/14 0229  WBC 10.9* 10.1  NEUTROABS 6.3  --   HGB 13.2 12.8  HCT 38.8 37.7  MCV 90.0 89.8  PLT 262 254   Cardiac Enzymes:  Recent Labs Lab 04/02/14 2157 04/03/14 0229 04/03/14 0824  TROPONINI <0.30 <0.30 <0.30   BNP: BNP (last 3 results) No results for input(s): PROBNP in the last 8760 hours. CBG: No results for input(s): GLUCAP in the last 168 hours.     Signed:  Velvet Bathe  [facility] 04/03/2014, 12:02 PM

## 2014-04-03 NOTE — Progress Notes (Signed)
Patient received discharge instructions and had no further questions or concerns.  Patient's IV was removed and was clean, dry, and intact at removal.  Patient was in stable condition at discharge.  Patient was escorted to vehicle via wheelchair by patient advocate.   Patient was able to teach back about low sodium diet.

## 2014-04-03 NOTE — Care Management Note (Signed)
    Page 1 of 1   04/03/2014     11:22:08 AM CARE MANAGEMENT NOTE 04/03/2014  Patient:  Karen Potter, Karen Potter   Account Number:  [id number]  Date Initiated:  04/03/2014  Documentation initiated by:  Jolene Provost  Subjective/Objective Assessment:   Pt from home.     Action/Plan:   Pt will discharge home. No CM needs identified.   Anticipated DC Date:  04/04/2014   Anticipated DC Plan:  [place]  CM consult      Choice offered to / List presented to:             Status of service:  Completed, signed off Medicare Important Message given?   (If response is "NO", the following Medicare IM given date fields will be blank) Date Medicare IM given:   Medicare IM given by:   Date Additional Medicare IM given:   Additional Medicare IM given by:    Discharge Disposition:  HOME/SELF CARE  Per UR Regulation:    If discussed at Long Length of Stay Meetings, dates discussed:    Comments:  04/03/2014 [place], RN, MSN, [facility]

## 2014-04-03 NOTE — H&P (Signed)
PCP:   Redge Gainer, MD   Chief Complaint:  Chest pain  HPI:  73 year old female who  has a past medical history of Amnesia; Polio; Migraine; Hyperglycemia; Hyperlipidemia; Hypertension; Hypothyroid; Seasonal allergies; Vitamin D deficiency; Obesity; and Fatty liver. today came to the ED after patient started having chest pain around 4 PM yesterday. Patient says that the pain was worse on deep breathing, there was no relieving factors. Pain was radiating towards the throat. She denies shortness of breath, she does have history of sleep apnea and is using C Pap at home. She denies nausea vomiting or diarrhea. He does have a history of GERD. Currently patient has no pain. She has no history of CAD but has a history of TIA in the past.   Allergies:   Allergies  Allergen Reactions  . Codeine Itching  . Other Itching    Most pain meds cause itching.  When has to take pain medication, she has been instructed to take Benadryl  . Doxycycline Rash      Past Medical History  Diagnosis Date  . Amnesia   . Polio   . Migraine   . Hyperglycemia   . Hyperlipidemia   . Hypertension   . Hypothyroid   . Seasonal allergies   . Vitamin D deficiency   . Obesity   . Fatty liver     Past Surgical History  Procedure Laterality Date  . Abdominal hysterectomy    . Cholecystectomy    . Carpal tunnel release    . Back surgery    . Knee surgery      right  . Bladder surgery      Prior to Admission medications   Medication Sig Start Date End Date Taking? Authorizing Provider  albuterol (PROVENTIL HFA;VENTOLIN HFA) 108 (90 BASE) MCG/ACT inhaler Inhale 2 puffs into the lungs every 6 (six) hours as needed for wheezing or shortness of breath. 08/17/13   Lysbeth Penner, FNP  aspirin 325 MG tablet Take 325 mg by mouth daily.    Historical Provider, MD  beclomethasone (QVAR) 40 MCG/ACT inhaler Inhale 2 puffs into the lungs 2 (two) times daily. 09/03/13   Vernie Shanks, MD  carvedilol (COREG)  6.25 MG tablet TAKE ONE TABLET BY MOUTH TWICE DAILY WITH A MEAL 02/04/14   Sharion Balloon, FNP  carvedilol (COREG) 6.25 MG tablet TAKE ONE TABLET BY MOUTH TWICE DAILY WITH A MEAL 02/25/14   Chipper Herb, MD  Cholecalciferol (VITAMIN D3) 2000 UNITS capsule Take 2,000 Units by mouth daily.    Historical Provider, MD  clotrimazole-betamethasone (LOTRISONE) cream Apply 1 application topically 2 (two) times daily. 11/05/13   Sharion Balloon, FNP  Docosahexaenoic Acid (DHA PO) Take 1 capsule by mouth daily.    Historical Provider, MD  guaiFENesin (MUCINEX) 600 MG 12 hr tablet Take 600 mg by mouth 2 (two) times daily as needed.    Historical Provider, MD  Lancets Misc. ([place]) [place] W Moore, MD  levothyroxine (SYNTHROID, LEVOTHROID) 150 MCG tablet Take 150 mcg by mouth once a week. Friday 12/21/12   Lysbeth Penner, FNP  levothyroxine (SYNTHROID, LEVOTHROID) 175 MCG tablet Take 175 mcg by mouth daily before breakfast. Saturday through Thursday 11/05/13   Sharion Balloon, FNP  losartan-hydrochlorothiazide (HYZAAR) 100-25 MG per tablet Take 1 tablet by mouth daily. 11/05/13   Sharion Balloon, FNP  omeprazole (PRILOSEC) 20 MG capsule Take 20 mg by mouth  daily.    Historical Provider, MD  [facility] VERIO test strip USE ONE  EVERY DAY TO CHECK BLOOD SUGAR LEVELS    Chipper Herb, MD  Probiotic Product (RESTORA PO) Take 1 capsule by mouth daily.    Historical Provider, MD    Social History:  reports that she has never smoked. She does not have any smokeless tobacco history on file. She reports that she does not drink alcohol or use illicit drugs.  Family History  Problem Relation Age of Onset  . Heart failure Mother   . Cancer Father   . Cancer Brother      All the positives are listed in BOLD  Review of Systems:  HEENT: Headache, blurred vision, runny nose, sore throat Neck: Hypothyroidism, hyperthyroidism,,lymphadenopathy Chest : Shortness of breath, history of  COPD, Asthma Heart : Chest pain, history of coronary arterey disease GI:  Nausea, vomiting, diarrhea, constipation, GERD GU: Dysuria, urgency, frequency of urination, hematuria Neuro: Stroke, seizures, syncope Psych: Depression, anxiety, hallucinations   Physical Exam: Blood pressure 130/65, pulse 78, temperature 98.7 F (37.1 C), temperature source Oral, resp. rate 16, height 5\' 4"  (1.626 m), weight 113.8 kg (250 lb 14.1 oz), SpO2 96 %. Constitutional:   Patient is a well-developed and well-nourished female* in no acute distress and cooperative with exam. Head: Normocephalic and atraumatic Mouth: Mucus membranes moist Eyes: PERRL, EOMI, conjunctivae normal Neck: Supple, No Thyromegaly Cardiovascular: RRR, S1 normal, S2 normal Pulmonary/Chest: CTAB, no wheezes, rales, or rhonchi Abdominal: Soft. Non-tender, non-distended, bowel sounds are normal, no masses, organomegaly, or guarding present.  Neurological: A&O x3, Strength is normal and symmetric bilaterally, cranial nerve II-XII are grossly intact, no focal motor deficit, sensory intact to light touch bilaterally.  Extremities : No Cyanosis, Clubbing , trace edema of the lower extremities  Labs on Admission:  Basic Metabolic Panel:  Recent Labs Lab 04/02/14 2043  NA 141  K 4.3  CL 102  CO2 27  GLUCOSE 155*  BUN 15  CREATININE 0.81  CALCIUM 10.5   Liver Function Tests: No results for input(s): AST, ALT, ALKPHOS, BILITOT, PROT, ALBUMIN in the last 168 hours. No results for input(s): LIPASE, AMYLASE in the last 168 hours. No results for input(s): AMMONIA in the last 168 hours. CBC:  Recent Labs Lab 04/02/14 2043  WBC 10.9*  NEUTROABS 6.3  HGB 13.2  HCT 38.8  MCV 90.0  PLT 262   Cardiac Enzymes:  Recent Labs Lab 04/02/14 2157  TROPONINI <0.30    BNP (last 3 results) No results for input(s): PROBNP in the last 8760 hours. CBG: No results for input(s): GLUCAP in the last 168 hours.  Radiological Exams on  Admission: Dg Chest 2 View  04/02/2014   CLINICAL DATA:  Chest pain  EXAM: CHEST  2 VIEW  COMPARISON:  08/17/2013  FINDINGS: Normal heart size. No pleural effusion or edema. No airspace consolidation. Scar versus platelike atelectasis noted at the left base. There is asymmetric elevation of the right hemidiaphragm. Mild spondylosis identified within the thoracic spine.  IMPRESSION: 1. Scar versus platelike atelectasis in the left base. 2. Asymmetric elevation of the right hemidiaphragm.   Electronically Signed   By: Kerby Moors M.D.   On: 04/02/2014 21:19    EKG: . Normal sinus rhythm   Assessment/Plan Principal Problem:   Chest pain Active Problems:   Essential hypertension   Hyperglycemia   Hyperlipidemia   Hypothyroid   GERD (gastroesophageal reflux disease)  Chest pain Patient has significant risk factors  including hyperlipidemia, hypertension, obesity. Will admit the patient in telemetry and obtain serial cardiac enzymes. First set of troponin is negative in the ED.  Hyperglycemia Patient has blood glucose 155, will obtain hemoglobin A1c as patient has a history of hypoglycemia but was never formally diagnosed with diabetes mellitus.  Essential hypertension Continue home medications Hyzaar  GERD Continue PPI  DVT prophylaxis Lovenox  Code status: Patient is full code  Family discussion: No family at bedside   Time Spent on Admission: 60 minutes  [facility] Pager: [phone] 04/03/2014, 2:12 AM  If 7PM-7AM, please contact night-coverage  [email or web address]  Password [email or web address]

## 2014-04-11 ENCOUNTER — Telehealth: Payer: Self-pay | Admitting: *Deleted

## 2014-04-11 NOTE — Telephone Encounter (Signed)
Pt out of town until week of Dec, 14. Appt made for 12/18 to followup for elevated BS and elevated AIC.

## 2014-04-11 NOTE — Telephone Encounter (Signed)
Assess lipids as well per hospital note. Pt will fast for 5-6 hours before appt.

## 2014-04-11 NOTE — Telephone Encounter (Signed)
Pt needs appt for hospital  followup.

## 2014-04-26 ENCOUNTER — Ambulatory Visit (INDEPENDENT_AMBULATORY_CARE_PROVIDER_SITE_OTHER): Payer: Medicare Other | Admitting: Family Medicine

## 2014-04-26 ENCOUNTER — Encounter: Payer: Self-pay | Admitting: Family Medicine

## 2014-04-26 VITALS — BP 145/88 | HR 73 | Temp 98.1°F | Ht 64.0 in | Wt 248.6 lb

## 2014-04-26 DIAGNOSIS — E119 Type 2 diabetes mellitus without complications: Secondary | ICD-10-CM

## 2014-04-26 MED ORDER — METFORMIN HCL 500 MG PO TABS: 500.0000 mg | ORAL_TABLET | Freq: Two times a day (BID) | ORAL | Status: DC

## 2014-04-26 NOTE — Patient Instructions (Signed)

## 2014-04-26 NOTE — Progress Notes (Signed)
   Subjective:    Patient ID: Karen Potter, female    DOB: 1941/04/03, 73 y.o.   MRN: [id number]  HPI 73 year old female here as a follow-up to hospitalization for chest pain. Airway she had cardiac enzymes failed to document any injury and discharge diagnosis was reflux, despite the fact that she takes omeprazole. She is not had recurrent pain since her discharge about a month ago. In the hospital she was found to be hyperglycemic and we have noted this for some time but have not begun any treatment. She monitors her sugars at home as well since her husband is diabetic and they have been steadily increasing. She has insight as to diabetes and role of diet.    Review of Systems  Constitutional: Negative.   HENT: Negative.   Eyes: Negative.   Respiratory: Positive for chest tightness.   Cardiovascular: Negative.   Gastrointestinal: Negative.   Endocrine: Positive for polydipsia and polyuria.  Genitourinary: Negative.   Hematological: Negative.   Psychiatric/Behavioral: Negative.        Objective:   Physical Exam  Constitutional: She appears well-developed and well-nourished.  Cardiovascular: Normal rate.   Pulmonary/Chest: Effort normal and breath sounds normal.  Abdominal: Soft.    BP 145/88 mmHg  Pulse 73  Temp(Src) 98.1 F (36.7 C) (Oral)  Ht 5\' 4"  (1.626 m)  Wt 248 lb 9.6 oz (112.764 kg)  BMI 42.65 kg/m2      Assessment & Plan:  1. Type 2 diabetes mellitus without complication Initiate treatment with metformin 500 mg BID Re-check sugars at home and here in 3 months de[ending home readings  Wardell Honour MD

## 2014-05-08 ENCOUNTER — Other Ambulatory Visit: Payer: Self-pay | Admitting: Family Medicine

## 2014-06-11 DIAGNOSIS — M25561 Pain in right knee: Secondary | ICD-10-CM | POA: Diagnosis not present

## 2014-06-12 ENCOUNTER — Other Ambulatory Visit (HOSPITAL_COMMUNITY): Payer: Self-pay | Admitting: Orthopedic Surgery

## 2014-06-12 DIAGNOSIS — M25561 Pain in right knee: Secondary | ICD-10-CM

## 2014-06-13 DIAGNOSIS — G4733 Obstructive sleep apnea (adult) (pediatric): Secondary | ICD-10-CM | POA: Diagnosis not present

## 2014-06-18 ENCOUNTER — Encounter (HOSPITAL_COMMUNITY)
Admission: RE | Admit: 2014-06-18 | Discharge: 2014-06-18 | Disposition: A | Discharge status: Home / Self Care | Payer: Medicare Other | Source: Ambulatory Visit | Attending: Orthopedic Surgery | Admitting: Orthopedic Surgery

## 2014-06-18 DIAGNOSIS — M25561 Pain in right knee: Secondary | ICD-10-CM | POA: Diagnosis not present

## 2014-06-18 DIAGNOSIS — M7989 Other specified soft tissue disorders: Secondary | ICD-10-CM | POA: Diagnosis not present

## 2014-06-18 DIAGNOSIS — M25569 Pain in unspecified knee: Secondary | ICD-10-CM | POA: Diagnosis not present

## 2014-06-18 MED ORDER — TECHNETIUM TC 99M MEDRONATE IV KIT
25.0000 | PACK | Freq: Once | INTRAVENOUS | Status: AC | PRN
  Administered 2014-06-18: 25 via INTRAVENOUS

## 2014-06-27 DIAGNOSIS — Z471 Aftercare following joint replacement surgery: Secondary | ICD-10-CM | POA: Diagnosis not present

## 2014-06-27 DIAGNOSIS — Z96651 Presence of right artificial knee joint: Secondary | ICD-10-CM | POA: Diagnosis not present

## 2014-06-27 DIAGNOSIS — M25561 Pain in right knee: Secondary | ICD-10-CM | POA: Diagnosis not present

## 2014-08-05 ENCOUNTER — Encounter: Payer: Self-pay | Admitting: Family Medicine

## 2014-08-05 ENCOUNTER — Ambulatory Visit (INDEPENDENT_AMBULATORY_CARE_PROVIDER_SITE_OTHER): Payer: Medicare Other | Admitting: Family Medicine

## 2014-08-05 VITALS — BP 115/71 | HR 68 | Temp 98.3°F | Ht 64.0 in | Wt 243.0 lb

## 2014-08-05 DIAGNOSIS — E119 Type 2 diabetes mellitus without complications: Secondary | ICD-10-CM

## 2014-08-05 DIAGNOSIS — E039 Hypothyroidism, unspecified: Secondary | ICD-10-CM

## 2014-08-05 DIAGNOSIS — Z23 Encounter for immunization: Secondary | ICD-10-CM | POA: Diagnosis not present

## 2014-08-05 DIAGNOSIS — E785 Hyperlipidemia, unspecified: Secondary | ICD-10-CM

## 2014-08-05 DIAGNOSIS — E1169 Type 2 diabetes mellitus with other specified complication: Secondary | ICD-10-CM | POA: Insufficient documentation

## 2014-08-05 DIAGNOSIS — I10 Essential (primary) hypertension: Secondary | ICD-10-CM | POA: Diagnosis not present

## 2014-08-05 LAB — POCT GLYCOSYLATED HEMOGLOBIN (HGB A1C): Hemoglobin A1C: 5.9

## 2014-08-05 MED ORDER — METFORMIN HCL 500 MG PO TABS: 500.0000 mg | ORAL_TABLET | Freq: Two times a day (BID) | ORAL | Status: DC

## 2014-08-05 NOTE — Patient Instructions (Signed)

## 2014-08-05 NOTE — Progress Notes (Signed)
Subjective:    Patient ID: Karen Potter, female    DOB: February 21, 1941, 74 y.o.   MRN: [id number]  HPI 74 year old female here to follow-up her diabetes. She was placed on metformin 500 mg twice a day 3 months ago and has seen her sugars at home come down from 190 range to about 120. Her weight has also decreased about 6 pounds but she is having problems with chronic diarrhea. She says that the metformin does not seem to have affected the diarrhea. She has seen gastroenterologists in the past without specific diagnosis. It sounds like she may have diarrhea secondary to IBS.  Patient Active Problem List   Diagnosis Date Noted  . Chest pain 04/02/2014  . GERD (gastroesophageal reflux disease) 11/05/2013  . Fatty liver 06/01/2013  . Hypercalcemia 05/14/2013  . Abnormal transaminases 05/14/2013  . Hot flashes 05/14/2013  . Hyperglycemia   . Hyperlipidemia   . Hypertension   . Hypothyroid   . Migraine   . Polio   . Amnesia   . Seasonal allergies   . Vitamin D deficiency   . Obesity   . Aphasia 04/11/2012  . TIA (transient ischemic attack) 04/11/2012  . 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 OBSTRUCTIVE SLEEP APNEA 05/31/2008  . Essential hypertension 05/31/2008  . OVARIAN CYST 05/31/2008  . CHOLECYSTECTOMY, HX OF 05/31/2008  . DYSPNEA 06/01/2007  . CHEST PAIN 06/01/2007   Outpatient Encounter Prescriptions as of 08/05/2014  Medication Sig  . albuterol (PROVENTIL HFA;VENTOLIN HFA) 108 (90 BASE) MCG/ACT inhaler Inhale 2 puffs into the lungs every 6 (six) hours as needed for wheezing or shortness of breath.  Marland Kitchen aspirin 325 MG tablet Take 325 mg by mouth daily.  . carvedilol (COREG) 6.25 MG tablet TAKE ONE TABLET BY MOUTH TWICE DAILY WITH A MEAL  . Cholecalciferol (VITAMIN D3) 2000 UNITS capsule Take 2,000 Units by mouth daily.  . clotrimazole-betamethasone (LOTRISONE) cream Apply 1 application topically 2 (two) times daily.  . Docosahexaenoic Acid (DHA PO) Take 1 capsule by mouth 2 (two) times  daily.   Marland Kitchen guaiFENesin (MUCINEX) 600 MG 12 hr tablet Take 600 mg by mouth 2 (two) times daily as needed.  Marland Kitchen levothyroxine (SYNTHROID, LEVOTHROID) 150 MCG tablet Take 150 mcg by mouth once a week. Friday  . levothyroxine (SYNTHROID, LEVOTHROID) 150 MCG tablet TAKE ONE TABLET BY MOUTH ONCE DAILY  . levothyroxine (SYNTHROID, LEVOTHROID) 175 MCG tablet Take 175 mcg by mouth daily before breakfast. Saturday through Thursday  . losartan-hydrochlorothiazide (HYZAAR) 100-25 MG per tablet Take 1 tablet by mouth daily.  . metFORMIN (GLUCOPHAGE) 500 MG tablet Take 1 tablet (500 mg total) by mouth 2 (two) times daily with a meal.  . omeprazole (PRILOSEC) 20 MG capsule Take 20 mg by mouth daily.      Review of Systems  Constitutional: Negative.   Respiratory: Negative.   Cardiovascular: Negative.   Gastrointestinal: Positive for diarrhea.  Neurological: Negative.        Objective:   Physical Exam  Constitutional: She is oriented to person, place, and time. She appears well-developed and well-nourished.  Eyes: Conjunctivae and EOM are normal.  Neck: Normal range of motion. Neck supple.  Cardiovascular: Normal rate, regular rhythm and normal heart sounds.   Pulmonary/Chest: Effort normal and breath sounds normal.  Abdominal: Soft. Bowel sounds are normal.  Musculoskeletal: Normal range of motion.  Neurological: She is alert and oriented to person, place, and time. She has normal reflexes.  Skin: Skin is warm and dry.  Psychiatric: She has  a normal mood and affect. Her behavior is normal. Thought content normal.     BP 115/71 mmHg  Pulse 68  Temp(Src) 98.3 F (36.8 C) (Oral)  Ht 5\' 4"  (1.626 m)  Wt 243 lb (110.224 kg)  BMI 41.69 kg/m2         Assessment & Plan:  1. Hypothyroidism, unspecified hypothyroidism type Thyroid TSH was checked in late June and will be due 1 year  2. Essential hypertension Blood pressure is controlled on 3 drug regimen including Hyzaar, carvedilol, and  amlodipine.  3. Hyperlipidemia Lipids will be checked at next visit  4. Diabetes mellitus without complication E1Y has decreased from 7.4 - 5.9 - Microalbumin, urine - POCT glycosylated hemoglobin (Hb A1C)  Wardell Honour MD

## 2014-08-06 ENCOUNTER — Telehealth: Payer: Self-pay | Admitting: Family Medicine

## 2014-08-06 LAB — MICROALBUMIN, URINE: Microalbumin, Urine: 27.6 ug/mL — ABNORMAL HIGH (ref 0.0–17.0)

## 2014-08-06 NOTE — Telephone Encounter (Signed)
Pt aware of Hgb A1c results

## 2014-08-06 NOTE — Telephone Encounter (Signed)
-----   Message from Wardell Honour, MD sent at 08/06/2014  8:02 AM EDT ----- Hemoglobin A1c is excellent plan I would continue whatever she is doing

## 2014-09-23 ENCOUNTER — Other Ambulatory Visit: Payer: Self-pay | Admitting: Family Medicine

## 2014-09-30 ENCOUNTER — Telehealth: Payer: Self-pay | Admitting: Family Medicine

## 2014-10-01 MED ORDER — GLIMEPIRIDE 2 MG PO TABS: 2.0000 mg | ORAL_TABLET | Freq: Every day | ORAL | Status: DC

## 2014-10-01 NOTE — Telephone Encounter (Signed)
Patient is taking with food so I am going to go ahead and send in rx for amaryl as Dr. Sabra Heck stated. Patient aware

## 2014-10-01 NOTE — Telephone Encounter (Signed)
Patient says that the diarrhea has gotten worse since starting the metformin.

## 2014-10-01 NOTE — Telephone Encounter (Signed)
First be sure she is taking metformin with food if she is an still having diarrhea discontinue metformin and begin Amaryl 2 mg daily

## 2014-10-13 DIAGNOSIS — Z90710 Acquired absence of both cervix and uterus: Secondary | ICD-10-CM | POA: Diagnosis not present

## 2014-10-13 DIAGNOSIS — Z79899 Other long term (current) drug therapy: Secondary | ICD-10-CM | POA: Diagnosis not present

## 2014-10-13 DIAGNOSIS — N201 Calculus of ureter: Secondary | ICD-10-CM | POA: Diagnosis not present

## 2014-10-13 DIAGNOSIS — N23 Unspecified renal colic: Secondary | ICD-10-CM | POA: Diagnosis not present

## 2014-10-13 DIAGNOSIS — Z9889 Other specified postprocedural states: Secondary | ICD-10-CM | POA: Diagnosis not present

## 2014-10-13 DIAGNOSIS — E039 Hypothyroidism, unspecified: Secondary | ICD-10-CM | POA: Diagnosis not present

## 2014-10-13 DIAGNOSIS — Z8612 Personal history of poliomyelitis: Secondary | ICD-10-CM | POA: Diagnosis not present

## 2014-10-13 DIAGNOSIS — E119 Type 2 diabetes mellitus without complications: Secondary | ICD-10-CM | POA: Diagnosis not present

## 2014-10-13 DIAGNOSIS — Z8673 Personal history of transient ischemic attack (TIA), and cerebral infarction without residual deficits: Secondary | ICD-10-CM | POA: Diagnosis not present

## 2014-10-13 DIAGNOSIS — Z6841 Body Mass Index (BMI) 40.0 and over, adult: Secondary | ICD-10-CM | POA: Diagnosis not present

## 2014-10-13 DIAGNOSIS — N1330 Unspecified hydronephrosis: Secondary | ICD-10-CM | POA: Diagnosis not present

## 2014-10-13 DIAGNOSIS — N134 Hydroureter: Secondary | ICD-10-CM | POA: Diagnosis not present

## 2014-10-13 DIAGNOSIS — Z9089 Acquired absence of other organs: Secondary | ICD-10-CM | POA: Diagnosis not present

## 2014-10-13 DIAGNOSIS — R1030 Lower abdominal pain, unspecified: Secondary | ICD-10-CM | POA: Diagnosis not present

## 2014-10-13 DIAGNOSIS — Z96651 Presence of right artificial knee joint: Secondary | ICD-10-CM | POA: Diagnosis not present

## 2014-10-13 DIAGNOSIS — I10 Essential (primary) hypertension: Secondary | ICD-10-CM | POA: Diagnosis not present

## 2014-10-13 DIAGNOSIS — Z888 Allergy status to other drugs, medicaments and biological substances status: Secondary | ICD-10-CM | POA: Diagnosis not present

## 2014-10-13 DIAGNOSIS — M1990 Unspecified osteoarthritis, unspecified site: Secondary | ICD-10-CM | POA: Diagnosis not present

## 2014-10-13 DIAGNOSIS — G4733 Obstructive sleep apnea (adult) (pediatric): Secondary | ICD-10-CM | POA: Diagnosis not present

## 2014-10-13 DIAGNOSIS — E785 Hyperlipidemia, unspecified: Secondary | ICD-10-CM | POA: Diagnosis not present

## 2014-10-13 DIAGNOSIS — D72829 Elevated white blood cell count, unspecified: Secondary | ICD-10-CM | POA: Diagnosis not present

## 2014-10-13 DIAGNOSIS — Z801 Family history of malignant neoplasm of trachea, bronchus and lung: Secondary | ICD-10-CM | POA: Diagnosis not present

## 2014-10-13 DIAGNOSIS — N132 Hydronephrosis with renal and ureteral calculous obstruction: Secondary | ICD-10-CM | POA: Diagnosis not present

## 2014-10-13 DIAGNOSIS — Z9049 Acquired absence of other specified parts of digestive tract: Secondary | ICD-10-CM | POA: Diagnosis not present

## 2014-10-13 DIAGNOSIS — Z8709 Personal history of other diseases of the respiratory system: Secondary | ICD-10-CM | POA: Diagnosis not present

## 2014-10-13 DIAGNOSIS — R1031 Right lower quadrant pain: Secondary | ICD-10-CM | POA: Diagnosis not present

## 2014-10-13 DIAGNOSIS — Z825 Family history of asthma and other chronic lower respiratory diseases: Secondary | ICD-10-CM | POA: Diagnosis not present

## 2014-10-13 DIAGNOSIS — Z881 Allergy status to other antibiotic agents status: Secondary | ICD-10-CM | POA: Diagnosis not present

## 2014-10-14 DIAGNOSIS — I10 Essential (primary) hypertension: Secondary | ICD-10-CM | POA: Diagnosis not present

## 2014-10-14 DIAGNOSIS — N23 Unspecified renal colic: Secondary | ICD-10-CM | POA: Diagnosis not present

## 2014-10-14 DIAGNOSIS — N201 Calculus of ureter: Secondary | ICD-10-CM | POA: Diagnosis not present

## 2014-10-14 DIAGNOSIS — N390 Urinary tract infection, site not specified: Secondary | ICD-10-CM | POA: Diagnosis not present

## 2014-10-14 DIAGNOSIS — E119 Type 2 diabetes mellitus without complications: Secondary | ICD-10-CM | POA: Diagnosis not present

## 2014-10-14 DIAGNOSIS — D72829 Elevated white blood cell count, unspecified: Secondary | ICD-10-CM | POA: Diagnosis not present

## 2014-10-14 DIAGNOSIS — N1330 Unspecified hydronephrosis: Secondary | ICD-10-CM | POA: Diagnosis not present

## 2014-10-14 DIAGNOSIS — E780 Pure hypercholesterolemia: Secondary | ICD-10-CM | POA: Diagnosis not present

## 2014-10-14 DIAGNOSIS — N134 Hydroureter: Secondary | ICD-10-CM | POA: Diagnosis not present

## 2014-10-14 DIAGNOSIS — N200 Calculus of kidney: Secondary | ICD-10-CM | POA: Diagnosis not present

## 2014-10-15 ENCOUNTER — Telehealth: Payer: Self-pay | Admitting: Family Medicine

## 2014-10-15 DIAGNOSIS — N132 Hydronephrosis with renal and ureteral calculous obstruction: Secondary | ICD-10-CM | POA: Diagnosis not present

## 2014-10-15 DIAGNOSIS — N200 Calculus of kidney: Secondary | ICD-10-CM | POA: Diagnosis not present

## 2014-10-15 DIAGNOSIS — I10 Essential (primary) hypertension: Secondary | ICD-10-CM | POA: Diagnosis not present

## 2014-10-15 DIAGNOSIS — D72829 Elevated white blood cell count, unspecified: Secondary | ICD-10-CM | POA: Diagnosis not present

## 2014-10-15 NOTE — Telephone Encounter (Signed)
Please set up with [facility]

## 2014-10-15 NOTE — Telephone Encounter (Signed)
Please review and advise.

## 2014-10-16 NOTE — Telephone Encounter (Signed)
Pt notified appt will be set up with [facility]

## 2014-10-23 DIAGNOSIS — R3915 Urgency of urination: Secondary | ICD-10-CM | POA: Diagnosis not present

## 2014-10-23 DIAGNOSIS — N201 Calculus of ureter: Secondary | ICD-10-CM | POA: Diagnosis not present

## 2014-10-29 ENCOUNTER — Other Ambulatory Visit: Payer: Self-pay

## 2014-10-29 DIAGNOSIS — Z1231 Encounter for screening mammogram for malignant neoplasm of breast: Secondary | ICD-10-CM

## 2014-11-04 ENCOUNTER — Other Ambulatory Visit: Payer: Self-pay

## 2014-11-07 DIAGNOSIS — E119 Type 2 diabetes mellitus without complications: Secondary | ICD-10-CM | POA: Diagnosis not present

## 2014-11-07 LAB — HM DIABETES EYE EXAM

## 2014-11-08 ENCOUNTER — Other Ambulatory Visit: Payer: Self-pay | Admitting: Family

## 2014-11-08 NOTE — Telephone Encounter (Signed)
Last seen 08/05/14 Dr Sabra Heck  Last thyroid 11/05/13

## 2014-11-16 ENCOUNTER — Other Ambulatory Visit: Payer: Self-pay | Admitting: Family Medicine

## 2014-11-16 ENCOUNTER — Other Ambulatory Visit: Payer: Self-pay | Admitting: Family

## 2014-11-22 ENCOUNTER — Encounter: Payer: Self-pay | Admitting: Family Medicine

## 2014-11-26 DIAGNOSIS — N201 Calculus of ureter: Secondary | ICD-10-CM | POA: Diagnosis not present

## 2014-11-26 DIAGNOSIS — N200 Calculus of kidney: Secondary | ICD-10-CM | POA: Diagnosis not present

## 2014-11-28 ENCOUNTER — Ambulatory Visit
Admission: RE | Admit: 2014-11-28 | Discharge: 2014-11-28 | Disposition: A | Discharge status: Home / Self Care | Payer: Medicare Other | Source: Ambulatory Visit

## 2014-11-28 DIAGNOSIS — Z1231 Encounter for screening mammogram for malignant neoplasm of breast: Secondary | ICD-10-CM

## 2014-12-02 DIAGNOSIS — N200 Calculus of kidney: Secondary | ICD-10-CM | POA: Diagnosis not present

## 2014-12-31 ENCOUNTER — Telehealth: Payer: Self-pay | Admitting: Family Medicine

## 2014-12-31 MED ORDER — GLUCOSE BLOOD VI STRP: ORAL_STRIP | Status: DC

## 2014-12-31 MED ORDER — ONETOUCH LANCETS MISC: Status: DC

## 2014-12-31 NOTE — Telephone Encounter (Signed)
Patient has lost her BS machine. A new meter an rx has been given.

## 2015-01-10 ENCOUNTER — Telehealth: Payer: Self-pay | Admitting: Family Medicine

## 2015-01-10 ENCOUNTER — Other Ambulatory Visit: Payer: Self-pay | Admitting: Surgery

## 2015-01-10 DIAGNOSIS — E213 Hyperparathyroidism, unspecified: Secondary | ICD-10-CM

## 2015-01-10 NOTE — Telephone Encounter (Signed)
Pt to hav eDEXA before her next appointment with Dr. Sabra Heck

## 2015-01-17 ENCOUNTER — Other Ambulatory Visit: Payer: Self-pay | Admitting: Family

## 2015-01-17 ENCOUNTER — Telehealth (HOSPITAL_COMMUNITY): Payer: Self-pay | Admitting: Surgery

## 2015-01-17 MED ORDER — CARVEDILOL 6.25 MG PO TABS: 6.2500 mg | ORAL_TABLET | Freq: Two times a day (BID) | ORAL | Status: DC

## 2015-01-20 ENCOUNTER — Encounter (HOSPITAL_COMMUNITY): Payer: Medicare Other

## 2015-01-28 ENCOUNTER — Other Ambulatory Visit: Payer: Self-pay | Admitting: Surgery

## 2015-01-28 ENCOUNTER — Encounter (HOSPITAL_COMMUNITY): Payer: Medicare Other

## 2015-01-28 ENCOUNTER — Encounter (HOSPITAL_COMMUNITY)
Admission: RE | Admit: 2015-01-28 | Discharge: 2015-01-28 | Disposition: A | Discharge status: Home / Self Care | Payer: Medicare Other | Source: Ambulatory Visit | Attending: Surgery | Admitting: Surgery

## 2015-01-28 DIAGNOSIS — E213 Hyperparathyroidism, unspecified: Secondary | ICD-10-CM | POA: Insufficient documentation

## 2015-01-28 MED ORDER — TECHNETIUM TC 99M SESTAMIBI - CARDIOLITE
25.1000 | Freq: Once | INTRAVENOUS | Status: AC | PRN
  Administered 2015-01-28: 25.1 via INTRAVENOUS

## 2015-01-29 ENCOUNTER — Encounter: Payer: Self-pay | Admitting: Family Medicine

## 2015-01-29 ENCOUNTER — Other Ambulatory Visit: Payer: Self-pay | Admitting: Family Medicine

## 2015-01-29 DIAGNOSIS — Z780 Asymptomatic menopausal state: Secondary | ICD-10-CM

## 2015-01-29 NOTE — Telephone Encounter (Signed)
Spoke with pt and let her know that we would do this before appointment and it was documented on her appointment notes, She understands now

## 2015-02-03 ENCOUNTER — Other Ambulatory Visit: Payer: Self-pay | Admitting: Family Medicine

## 2015-02-04 ENCOUNTER — Other Ambulatory Visit: Payer: Self-pay | Admitting: Surgery

## 2015-02-04 DIAGNOSIS — D351 Benign neoplasm of parathyroid gland: Secondary | ICD-10-CM

## 2015-02-06 ENCOUNTER — Other Ambulatory Visit: Payer: Self-pay

## 2015-02-06 ENCOUNTER — Ambulatory Visit (INDEPENDENT_AMBULATORY_CARE_PROVIDER_SITE_OTHER): Payer: Medicare Other | Admitting: Family Medicine

## 2015-02-06 ENCOUNTER — Ambulatory Visit (INDEPENDENT_AMBULATORY_CARE_PROVIDER_SITE_OTHER): Payer: Medicare Other

## 2015-02-06 ENCOUNTER — Encounter: Payer: Self-pay | Admitting: Family Medicine

## 2015-02-06 VITALS — BP 113/71 | HR 70 | Temp 97.7°F | Ht 64.0 in | Wt 240.0 lb

## 2015-02-06 DIAGNOSIS — E119 Type 2 diabetes mellitus without complications: Secondary | ICD-10-CM | POA: Diagnosis not present

## 2015-02-06 DIAGNOSIS — I10 Essential (primary) hypertension: Secondary | ICD-10-CM | POA: Diagnosis not present

## 2015-02-06 DIAGNOSIS — Z780 Asymptomatic menopausal state: Secondary | ICD-10-CM | POA: Diagnosis not present

## 2015-02-06 DIAGNOSIS — E785 Hyperlipidemia, unspecified: Secondary | ICD-10-CM | POA: Diagnosis not present

## 2015-02-06 DIAGNOSIS — K219 Gastro-esophageal reflux disease without esophagitis: Secondary | ICD-10-CM | POA: Diagnosis not present

## 2015-02-06 DIAGNOSIS — Z23 Encounter for immunization: Secondary | ICD-10-CM | POA: Diagnosis not present

## 2015-02-06 DIAGNOSIS — E559 Vitamin D deficiency, unspecified: Secondary | ICD-10-CM

## 2015-02-06 DIAGNOSIS — E039 Hypothyroidism, unspecified: Secondary | ICD-10-CM

## 2015-02-06 LAB — POCT GLYCOSYLATED HEMOGLOBIN (HGB A1C): Hemoglobin A1C: 6.1

## 2015-02-06 MED ORDER — AMITRIPTYLINE HCL 25 MG PO TABS: 25.0000 mg | ORAL_TABLET | Freq: Every day | ORAL | Status: DC

## 2015-02-06 NOTE — Patient Instructions (Signed)
Medicare Annual Wellness Visit  [facility] and the medical providers at [facility] strive to bring you the best medical care.  In doing so we not only want to address your current medical conditions and concerns but also to detect new conditions early and prevent illness, disease and health-related problems.    Medicare offers a yearly Wellness Visit which allows our clinical staff to assess your need for preventative services including immunizations, lifestyle education, counseling to decrease risk of preventable diseases and screening for fall risk and other medical concerns.    This visit is provided free of charge (no copay) for all Medicare recipients. The clinical pharmacists at [facility] have begun to conduct these Wellness Visits which will also include a thorough review of all your medications.    As you primary medical provider recommend that you make an appointment for your Annual Wellness Visit if you have not done so already this year.  You may set up this appointment before you leave today or you may call back ([phone]) and schedule an appointment.  Please make sure when you call that you mention that you are scheduling your Annual Wellness Visit with the clinical pharmacist so that the appointment may be made for the proper length of time.     Continue current medications. Continue good therapeutic lifestyle changes which include good diet and exercise. Fall precautions discussed with patient. If an FOBT was given today- please return it to our front desk. If you are over 50 years old - you may need Prevnar 13 or the adult Pneumonia vaccine.  **Flu shots will be available soon--- please call and schedule a FLU-CLINIC appointment**  After your visit with us today you will receive a survey in the mail or online from [employer] regarding your care with us. Please take a moment to fill this out. Your feedback is  very important to us as you can help us better understand your patient needs as well as improve your experience and satisfaction. WE CARE ABOUT YOU!!!   **Please join us SEPT.[email or web address], 2016 from 5:00 to 7:00pm for our OPEN HOUSE! Come out and meet our NEW providers**  

## 2015-02-06 NOTE — Progress Notes (Signed)
Subjective:    Patient ID: Karen Potter, female    DOB: April 26, 1941, 74 y.o.   MRN: [id number]  HPI Pt here for follow up and management of chronic medical problems which includes hypertension, hyperlipidemia, hypothyroid, and diabetes. She is taking medications regularly. She is seeing urology for kidney stones and has ultrasound scheduled tomorrow kidney stones are thought to be related to her hypercalcemia which has a chronic problem. Neurology has also set her to general surgery to workup parathyroid disease.  Today she also has some back pain with radiation to her leg on the right. She has a previous history of back pain and disc surgery at L4-5 level. The pain in her leg now radiates to the anterior thigh and groin and does not go down to the foot. It is worse with straight leg raising. When asked she says the pain is more in her leg that her back.  At her last visit her A1c was 5.9 and she had been doing better since increasing her diabetic meds. She has gained 10 pounds since that visit she denies any burning or tingling in the feet. She denies chest pain       Patient Active Problem List   Diagnosis Date Noted  . Diabetes mellitus without complication   . Chest pain 04/02/2014  . GERD (gastroesophageal reflux disease) 11/05/2013  . Fatty liver 06/01/2013  . Hypercalcemia 05/14/2013  . Abnormal transaminases 05/14/2013  . Hot flashes 05/14/2013  . Hyperglycemia   . Hyperlipidemia   . Hypertension   . Hypothyroid   . Migraine   . Polio   . Amnesia   . Seasonal allergies   . Vitamin D deficiency   . Obesity   . Aphasia 04/11/2012  . TIA (transient ischemic attack) 04/11/2012  . 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 OBSTRUCTIVE SLEEP APNEA 05/31/2008  . Essential hypertension 05/31/2008  . OVARIAN CYST 05/31/2008  . CHOLECYSTECTOMY, HX OF 05/31/2008  . DYSPNEA 06/01/2007  . CHEST PAIN 06/01/2007   Outpatient Encounter Prescriptions as of 02/06/2015  Medication Sig  .  aspirin 325 MG tablet Take 325 mg by mouth daily.  . calcium citrate-vitamin D (CITRACAL+D) 315-200 MG-UNIT tablet Take 1 tablet by mouth daily.  . carvedilol (COREG) 6.25 MG tablet Take 1 tablet (6.25 mg total) by mouth 2 (two) times daily with a meal.  . Cholecalciferol (VITAMIN D3) 2000 UNITS capsule Take 2,000 Units by mouth daily.  . clotrimazole-betamethasone (LOTRISONE) cream Apply 1 application topically 2 (two) times daily.  . Docosahexaenoic Acid (DHA PO) Take 1 capsule by mouth 2 (two) times daily.   Marland Kitchen glimepiride (AMARYL) 2 MG tablet TAKE ONE TABLET BY MOUTH ONCE DAILY BEFORE BREAKFAST  . glucose blood test strip Test BS 1 time a day  . guaiFENesin (MUCINEX) 600 MG 12 hr tablet Take 600 mg by mouth 2 (two) times daily as needed.  Marland Kitchen levothyroxine (SYNTHROID, LEVOTHROID) 150 MCG tablet TAKE ONE TABLET BY MOUTH ONCE DAILY  . levothyroxine (SYNTHROID, LEVOTHROID) 175 MCG tablet TAKE ONE TABLET BY MOUTH ONCE DAILY  . losartan-hydrochlorothiazide (HYZAAR) 100-25 MG per tablet Take 1 tablet by mouth daily.  Marland Kitchen omeprazole (PRILOSEC) 20 MG capsule Take 20 mg by mouth daily.  Marland Kitchen ONE TOUCH LANCETS MISC Patient to check BS once daily  . [DISCONTINUED] albuterol (PROVENTIL HFA;VENTOLIN HFA) 108 (90 BASE) MCG/ACT inhaler Inhale 2 puffs into the lungs every 6 (six) hours as needed for wheezing or shortness of breath.  . [DISCONTINUED] carvedilol (COREG) 6.25 MG tablet  TAKE ONE TABLET BY MOUTH TWICE DAILY WITH MEALS  . [DISCONTINUED] levothyroxine (SYNTHROID, LEVOTHROID) 150 MCG tablet Take 150 mcg by mouth once a week. Friday  . [DISCONTINUED] levothyroxine (SYNTHROID, LEVOTHROID) 175 MCG tablet Take 175 mcg by mouth daily before breakfast. Saturday through Thursday   No facility-administered encounter medications on file as of 02/06/2015.     Review of Systems  Constitutional: Negative.   HENT: Negative.   Eyes: Negative.   Respiratory: Negative.   Cardiovascular: Negative.   Gastrointestinal:  Negative.   Endocrine: Negative.   Genitourinary: Negative.        Kidney stones  Musculoskeletal: Positive for back pain and arthralgias (right leg pain and hip    and    left lower leg pain and redness recently).  Skin: Negative.   Allergic/Immunologic: Negative.   Neurological: Negative.   Hematological: Negative.   Psychiatric/Behavioral: Negative.        Depression        Objective:   Physical Exam  Constitutional: She is oriented to person, place, and time. She appears well-developed and well-nourished.  Cardiovascular: Normal rate, regular rhythm and normal heart sounds.   Pulmonary/Chest: Effort normal and breath sounds normal.  Musculoskeletal:  Back: She does have pain with straight leg raising on the right. I cannot check reflexes and she's had knee replacement on that side.  Neurological: She is alert and oriented to person, place, and time.     BP 113/71 mmHg  Pulse 70  Temp(Src) 97.7 F (36.5 C) (Oral)  Ht '5\' 4"'  (1.626 m)  Wt 240 lb (108.863 kg)  BMI 41.18 kg/m2      Assessment & Plan:  1. Hypothyroidism, unspecified hypothyroidism type TSH was normal 1 year ago but needs to be checked today - TSH  2. Hyperlipidemia LDL was at goal. That also will be checked today - Lipid panel  3. Essential hypertension Blood pressure is good at 113/71. She takes carvedilol for blood pressure. We may need to look at getting her on an ace or arb - CMP14+EGFR  4. Type 2 diabetes mellitus without complication Last O6Z was 5.9 6 months ago. Based on her weight gain and other history I would expect it may have, gone up. Did add amitriptyline 25 mg for her neuropathy which I think is probably related more to her back and possibly disc disease. - POCT glycosylated hemoglobin (Hb A1C)  5. Vitamin D deficiency Vitamin D level not check today  6. Gastroesophageal reflux disease without esophagitis No complaints related to reflux  Wardell Honour MD

## 2015-02-07 ENCOUNTER — Ambulatory Visit
Admission: RE | Admit: 2015-02-07 | Discharge: 2015-02-07 | Disposition: A | Discharge status: Home / Self Care | Payer: Medicare Other | Source: Ambulatory Visit | Attending: Surgery | Admitting: Surgery

## 2015-02-07 DIAGNOSIS — E041 Nontoxic single thyroid nodule: Secondary | ICD-10-CM | POA: Diagnosis not present

## 2015-02-07 LAB — CMP14+EGFR
ALT: 52 IU/L — ABNORMAL HIGH (ref 0–32)
AST: 36 IU/L (ref 0–40)
Albumin/Globulin Ratio: 1.3 (ref 1.1–2.5)
Albumin: 4 g/dL (ref 3.5–4.8)
Alkaline Phosphatase: 77 IU/L (ref 39–117)
BUN/Creatinine Ratio: 21 (ref 11–26)
BUN: 14 mg/dL (ref 8–27)
Bilirubin Total: 0.3 mg/dL (ref 0.0–1.2)
CO2: 24 mmol/L (ref 18–29)
Calcium: 10.8 mg/dL — ABNORMAL HIGH (ref 8.7–10.3)
Chloride: 101 mmol/L (ref 97–108)
Creatinine, Ser: 0.66 mg/dL (ref 0.57–1.00)
GFR calc Af Amer: 101 mL/min/{1.73_m2} (ref >59)
GFR calc non Af Amer: 88 mL/min/{1.73_m2} (ref >59)
Globulin, Total: 3.2 g/dL (ref 1.5–4.5)
Glucose: 118 mg/dL — ABNORMAL HIGH (ref 65–99)
Potassium: 4.2 mmol/L (ref 3.5–5.2)
Sodium: 141 mmol/L (ref 134–144)
Total Protein: 7.2 g/dL (ref 6.0–8.5)

## 2015-02-07 LAB — TSH: TSH: 0.773 u[IU]/mL (ref 0.450–4.500)

## 2015-02-07 LAB — LIPID PANEL
Chol/HDL Ratio: 4.3 ratio units (ref 0.0–4.4)
Cholesterol, Total: 129 mg/dL (ref 100–199)
HDL: 30 mg/dL — ABNORMAL LOW (ref >39)
LDL Calculated: 53 mg/dL (ref 0–99)
Triglycerides: 231 mg/dL — ABNORMAL HIGH (ref 0–149)
VLDL Cholesterol Cal: 46 mg/dL — ABNORMAL HIGH (ref 5–40)

## 2015-02-10 ENCOUNTER — Other Ambulatory Visit: Payer: Self-pay | Admitting: Family

## 2015-02-10 ENCOUNTER — Encounter: Payer: Self-pay | Admitting: Family Medicine

## 2015-02-10 ENCOUNTER — Other Ambulatory Visit: Payer: Self-pay | Admitting: Nurse Practitioner

## 2015-02-12 NOTE — Progress Notes (Signed)
Patient has been informed of results. 

## 2015-02-17 ENCOUNTER — Ambulatory Visit: Payer: Self-pay | Admitting: Surgery

## 2015-02-21 ENCOUNTER — Other Ambulatory Visit: Payer: Self-pay | Admitting: *Deleted

## 2015-02-21 ENCOUNTER — Encounter: Payer: Self-pay | Admitting: *Deleted

## 2015-02-25 ENCOUNTER — Other Ambulatory Visit: Payer: Self-pay | Admitting: Family Medicine

## 2015-02-28 ENCOUNTER — Other Ambulatory Visit: Payer: Self-pay | Admitting: Family Medicine

## 2015-04-25 NOTE — Patient Instructions (Addendum)
Karen Potter  04/25/2015   Your procedure is scheduled on: 05-06-15  Report to [facility] Main  Entrance take [facility]  elevators to 3rd floor to  [place] at 5:30 AM.  Call this number if you have problems the morning of surgery [phone]   Remember: ONLY 1 PERSON MAY GO WITH YOU TO SHORT STAY TO GET  READY MORNING OF [place].  Do not eat food or drink liquids :After Midnight.               Bring in mask and tubing from c-pap machine     Take these medicines the morning of surgery with A SIP OF WATER: carvedilol (coreg), Levothyroxine (Synthroid), Omeprazole (Prilosec) DO NOT TAKE ANY DIABETIC MEDICATIONS DAY OF YOUR SURGERY                               You may not have any metal on your body including hair pins and              piercings  Do not wear jewelry, make-up, lotions, powders or perfumes, deodorant             Do not wear nail polish.  Do not shave  48 hours prior to surgery.            Do not bring valuables to the hospital. [place].  Contacts, dentures or bridgework may not be worn into surgery.  Leave suitcase in the car. After surgery it may be brought to your room.      Special Instructions: coughing and deep breathing exercises, leg exercises              Please read over the following fact sheets you were given: _____________________________________________________________________             [facility] [place] - Preparing for Surgery Before surgery, you can play an important role.  Because skin is not sterile, your skin needs to be as free of germs as possible.  You can reduce the number of germs on your skin by washing with CHG (chlorahexidine gluconate) soap before surgery.  CHG is an antiseptic cleaner which kills germs and bonds with the skin to continue killing germs even after washing. Please DO NOT use if you have an allergy to CHG or antibacterial soaps.  If your  skin becomes reddened/irritated stop using the CHG and inform your nurse when you arrive at Short Stay. Do not shave (including legs and underarms) for at least 48 hours prior to the first CHG shower.  You may shave your face/neck. Please follow these instructions carefully:  1.  Shower with CHG Soap the night before surgery and the  morning of Surgery.  2.  If you choose to wash your hair, wash your hair first as usual with your  normal  shampoo.  3.  After you shampoo, rinse your hair and body thoroughly to remove the  shampoo.                           4.  Use CHG as you would any other liquid soap.  You can apply chg directly  to the skin and wash  Gently with a scrungie or clean washcloth.  5.  Apply the CHG Soap to your body ONLY FROM THE NECK DOWN.   Do not use on face/ open                           Wound or open sores. Avoid contact with eyes, ears mouth and genitals (private parts).                       Wash face,  Genitals (private parts) with your normal soap.             6.  Wash thoroughly, paying special attention to the area where your surgery  will be performed.  7.  Thoroughly rinse your body with warm water from the neck down.  8.  DO NOT shower/wash with your normal soap after using and rinsing off  the CHG Soap.                9.  Pat yourself dry with a clean towel.            10.  Wear clean pajamas.            11.  Place clean sheets on your bed the night of your first shower and do not  sleep with pets. Day of Surgery : Do not apply any lotions/deodorants the morning of surgery.  Please wear clean clothes to the hospital/surgery center.  FAILURE TO FOLLOW THESE INSTRUCTIONS MAY RESULT IN THE CANCELLATION OF YOUR SURGERY PATIENT SIGNATURE_________________________________  NURSE SIGNATURE__________________________________  ________________________________________________________________________

## 2015-04-26 ENCOUNTER — Other Ambulatory Visit: Payer: Self-pay | Admitting: Family Medicine

## 2015-04-28 ENCOUNTER — Encounter (HOSPITAL_COMMUNITY)
Admission: RE | Admit: 2015-04-28 | Discharge: 2015-04-28 | Disposition: A | Discharge status: Home / Self Care | Payer: Medicare Other | Source: Ambulatory Visit | Attending: Surgery | Admitting: Surgery

## 2015-04-28 ENCOUNTER — Encounter (HOSPITAL_COMMUNITY): Payer: Self-pay

## 2015-04-28 DIAGNOSIS — Z01812 Encounter for preprocedural laboratory examination: Secondary | ICD-10-CM | POA: Diagnosis not present

## 2015-04-28 DIAGNOSIS — Z01810 Encounter for preprocedural cardiovascular examination: Secondary | ICD-10-CM | POA: Insufficient documentation

## 2015-04-28 DIAGNOSIS — E210 Primary hyperparathyroidism: Secondary | ICD-10-CM | POA: Diagnosis not present

## 2015-04-28 HISTORY — DX: Transient cerebral ischemic attack, unspecified: G45.9

## 2015-04-28 HISTORY — DX: Gastro-esophageal reflux disease without esophagitis: K21.9

## 2015-04-28 LAB — CBC
HCT: 39.7 % (ref 36.0–46.0)
Hemoglobin: 13 g/dL (ref 12.0–15.0)
MCH: 30.2 pg (ref 26.0–34.0)
MCHC: 32.7 g/dL (ref 30.0–36.0)
MCV: 92.1 fL (ref 78.0–100.0)
Platelets: 263 10*3/uL (ref 150–400)
RBC: 4.31 MIL/uL (ref 3.87–5.11)
RDW: 13.8 % (ref 11.5–15.5)
WBC: 7.3 10*3/uL (ref 4.0–10.5)

## 2015-04-28 LAB — BASIC METABOLIC PANEL
Anion gap: 5 (ref 5–15)
BUN: 18 mg/dL (ref 6–20)
CO2: 28 mmol/L (ref 22–32)
Calcium: 10.2 mg/dL (ref 8.9–10.3)
Chloride: 108 mmol/L (ref 101–111)
Creatinine, Ser: 0.84 mg/dL (ref 0.44–1.00)
GFR calc Af Amer: >60 mL/min (ref >60)
GFR calc non Af Amer: >60 mL/min (ref >60)
Glucose, Bld: 146 mg/dL — ABNORMAL HIGH (ref 65–99)
Potassium: 4 mmol/L (ref 3.5–5.1)
Sodium: 141 mmol/L (ref 135–145)

## 2015-04-28 NOTE — Progress Notes (Signed)
04-28-15 - Showed Dr. Smith Robert (anesthesia) pts. EKG from 03-23-14 ([facility]) and EKG from preop appt. (04-28-15).  Blood pressure today was 118/58, pt. With hx. Of TIA.  Pt. With no cardiac symptoms today.  No further instructions given.

## 2015-04-28 NOTE — Progress Notes (Addendum)
02-06-15 - LOV - Dr. Sabra Heck (fam.med.) - EPIC  04-02-14- EKG - EPIC

## 2015-04-29 LAB — HEMOGLOBIN A1C
Hgb A1c MFr Bld: 6.3 % — ABNORMAL HIGH (ref 4.8–5.6)
Mean Plasma Glucose: 134 mg/dL

## 2015-04-29 NOTE — Progress Notes (Signed)
HgA1C done 04/28/15 routed via EPIC to Dr Harlow Asa.

## 2015-05-05 ENCOUNTER — Encounter (HOSPITAL_COMMUNITY): Payer: Self-pay | Admitting: Surgery

## 2015-05-05 DIAGNOSIS — K759 Inflammatory liver disease, unspecified: Secondary | ICD-10-CM | POA: Diagnosis not present

## 2015-05-05 DIAGNOSIS — N200 Calculus of kidney: Secondary | ICD-10-CM | POA: Diagnosis not present

## 2015-05-05 DIAGNOSIS — E210 Primary hyperparathyroidism: Secondary | ICD-10-CM | POA: Diagnosis not present

## 2015-05-05 DIAGNOSIS — E119 Type 2 diabetes mellitus without complications: Secondary | ICD-10-CM | POA: Diagnosis not present

## 2015-05-05 DIAGNOSIS — Z8673 Personal history of transient ischemic attack (TIA), and cerebral infarction without residual deficits: Secondary | ICD-10-CM | POA: Diagnosis not present

## 2015-05-05 DIAGNOSIS — G4730 Sleep apnea, unspecified: Secondary | ICD-10-CM | POA: Diagnosis not present

## 2015-05-05 DIAGNOSIS — Z7984 Long term (current) use of oral hypoglycemic drugs: Secondary | ICD-10-CM | POA: Diagnosis not present

## 2015-05-05 DIAGNOSIS — Z79899 Other long term (current) drug therapy: Secondary | ICD-10-CM | POA: Diagnosis not present

## 2015-05-05 DIAGNOSIS — I10 Essential (primary) hypertension: Secondary | ICD-10-CM | POA: Diagnosis not present

## 2015-05-05 DIAGNOSIS — K219 Gastro-esophageal reflux disease without esophagitis: Secondary | ICD-10-CM | POA: Diagnosis not present

## 2015-05-05 DIAGNOSIS — E039 Hypothyroidism, unspecified: Secondary | ICD-10-CM | POA: Diagnosis not present

## 2015-05-05 DIAGNOSIS — J45909 Unspecified asthma, uncomplicated: Secondary | ICD-10-CM | POA: Diagnosis not present

## 2015-05-05 NOTE — H&P (Signed)
General Surgery [facility]  Jari Sportsman DOB: 17-Jun-1940 Married / Language: English / Race: White Female  History of Present Illness  The patient is a 74 year old female who presents with a parathyroid neoplasm. Patient is referred by Dr. Rosie Fate for evaluation of hypercalcemia and suspected primary hyperparathyroidism. Patient has recently developed nephrolithiasis. She also notes chronic fatigue. Patient has laboratory studies dating back to 2008 showing serum calcium levels ranging from 10.0-10.8. Intact PTH levels ranged from 38-43. Patient has had a bone density scan which does not show osteoporosis. Patient is referred for possible primary hyperparathyroidism. Patient has had no prior head or neck surgery. She does have hypothyroidism and takes levothyroxine daily. There is no family history of parathyroid disease and no family history of other endocrine neoplasms.  Other Problems Asthma Back Pain Cerebrovascular Accident Chest pain Cholelithiasis Diabetes Mellitus Gastroesophageal Reflux Disease General anesthesia - complications Hepatitis High blood pressure Kidney Stone Migraine Headache Oophorectomy Bilateral. Sleep Apnea Thyroid Disease  Past Surgical History Colon Polyp Removal - Colonoscopy Foot Surgery Left. Gallbladder Surgery - Open Hysterectomy (not due to cancer) - Complete Knee Surgery Left. Spinal Surgery - Lower Back Tonsillectomy  Diagnostic Studies History Colonoscopy 1-5 years ago Mammogram within last year Pap Smear >5 years ago  Allergies Codeine Phosphate *ANALGESICS - OPIOID* Doxycycline *DERMATOLOGICALS*  Medication History Levothyroxine Sodium (150MCG Tablet, Oral) Active. Levothyroxine Sodium (175MCG Tablet, Oral) Active. Losartan Potassium-HCTZ (100-25MG  Tablet, Oral) Active. MetFORMIN HCl (500MG  Tablet, Oral) Active. Omeprazole (20MG  Capsule DR, Oral)  Active. Glimepiride (2MG  Tablet, Oral) Active. Carvedilol (6.25MG  Tablet, Oral) Active. OneTouch Verio (In Vitro) Active. OneTouch Delica Lancets [id number] Active. Medications Reconciled  Social History Caffeine use Carbonated beverages. No alcohol use No drug use Tobacco use Never smoker.  Family History Alcohol Abuse Daughter, Father. Bleeding disorder Mother. Breast Cancer Family Members In [place], Father. Colon Cancer Family Members In General. Diabetes Mellitus Son. Heart Disease Mother. Heart disease in female family member before age 28 Respiratory Condition Mother.  Pregnancy / Birth History Age at menarche 89 years. Gravida 2 Maternal age 73-25 Para 2  Review of Systems Skin Present- New Lesions and Rash. Not Present- Change in Wart/Mole, Dryness, Hives, Jaundice, Non-Healing Wounds and Ulcer. HEENT Present- Hearing Loss and Wears glasses/contact lenses. Not Present- Earache, Hoarseness, Nose Bleed, Oral Ulcers, Ringing in the Ears, Seasonal Allergies, Sinus Pain, Sore Throat, Visual Disturbances and Yellow Eyes. Respiratory Present- Difficulty Breathing. Not Present- Bloody sputum, Chronic Cough, Snoring and Wheezing. Breast Not Present- Breast Mass, Breast Pain, Nipple Discharge and Skin Changes. Cardiovascular Present- Shortness of Breath and Swelling of Extremities. Not Present- Chest Pain, Difficulty Breathing Lying Down, Leg Cramps, Palpitations and Rapid Heart Rate. Gastrointestinal Present- Bloating, Chronic diarrhea and Excessive gas. Not Present- Abdominal Pain, Bloody Stool, Change in Bowel Habits, Constipation, Difficulty Swallowing, Gets full quickly at meals, Hemorrhoids, Indigestion, Nausea, Rectal Pain and Vomiting. Musculoskeletal Present- Back Pain and Joint Stiffness. Not Present- Joint Pain, Muscle Pain, Muscle Weakness and Swelling of Extremities. Neurological Present- Trouble walking. Not Present- Decreased Memory,  Fainting, Headaches, Numbness, Seizures, Tingling, Tremor and Weakness.  Vitals Weight: 238 lb Height: 64in Body Surface Area: 2.21 m Body Mass Index: 40.85 kg/m  Temp.: 72F(Temporal)  Pulse: 78 (Regular)  BP: 130/80 (Sitting, Left Arm, Standard)  Physical Exam   General - appears comfortable, no distress; not diaphorectic  HEENT - normocephalic; sclerae clear, gaze conjugate; mucous membranes moist, dentition good; voice normal  Neck - symmetric on extension;  no palpable anterior or posterior cervical adenopathy; no palpable masses in the thyroid bed  Chest - clear bilaterally without rhonchi, rales, or wheeze  Cor - regular rhythm with normal rate; no significant murmur  Ext - non-tender without significant edema or lymphedema  Neuro - grossly intact; no tremor  Assessment & Plan  HYPERCALCEMIA (275.42  E83.52)  Patient presents with hypercalcemia. Calcium levels have ranged from 10.0-10.8 dating back to 2008. Intact PTH levels have ranged from 38-43 but may be inappropriately in the upper range of normal given her serum calcium level.  I would like to repeat her calcium level and intact PTH level at [facility]. We will also obtain a 25-hydroxy vitamin D level and a 24-hour urine collection for calcium. Once the studies are completed, I will contact the patient with the results.  If the studies are indicative of primary hyperparathyroidism, we will then proceed with imaging modalities beginning with a nuclear medicine parathyroid scan.  Patient and I discussed the strategy. I provided her with written literature on parathyroid disease to review at home. She agrees to proceed.  Nuclear scan negative for adenoma.  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  Plan right inferior minimally invasive parathyroidectomy.  The risks and benefits of the procedure have been discussed at length with the patient.  The patient understands the proposed procedure,  potential alternative treatments, and the course of recovery to be expected.  All of the patient's questions have been answered at this time.  The patient wishes to proceed with surgery.  Earnstine Regal, MD, [facility] Office: [phone]

## 2015-05-05 NOTE — Anesthesia Preprocedure Evaluation (Addendum)
Anesthesia Evaluation  Patient identified by MRN, date of birth, ID band Patient awake, Patient confused and Patient unresponsive    Reviewed: Allergy & Precautions, NPO status , Patient's Chart, lab work & pertinent test results  Airway Mallampati: II  TM Distance: >3 FB Neck ROM: Full    Dental  (+) Missing,    Pulmonary sleep apnea and Continuous Positive Airway Pressure Ventilation ,    breath sounds clear to auscultation       Cardiovascular hypertension, Pt. on medications  Rhythm:Regular Rate:Normal     Neuro/Psych  Headaches, TIAnegative psych ROS   GI/Hepatic GERD  Medicated,(+) Hepatitis -, Unspecified  Endo/Other  diabetesHypothyroidism   Renal/GU   negative genitourinary   Musculoskeletal negative musculoskeletal ROS (+)   Abdominal   Peds negative pediatric ROS (+)  Hematology negative hematology ROS (+)   Anesthesia Other Findings   Reproductive/Obstetrics negative OB ROS                            Lab Results  Component Value Date   WBC 7.3 04/28/2015   HGB 13.0 04/28/2015   HCT 39.7 04/28/2015   MCV 92.1 04/28/2015   PLT 263 04/28/2015   Lab Results  Component Value Date   CREATININE 0.84 04/28/2015   BUN 18 04/28/2015   NA 141 04/28/2015   K 4.0 04/28/2015   CL 108 04/28/2015   CO2 28 04/28/2015   Lab Results  Component Value Date   INR 1.1 08/02/2013   INR 1.14 04/08/2012   INR 1.7* 04/11/2008   04/2015: EKG: NSR, RBBB  Anesthesia Physical Anesthesia Plan  ASA: III  Anesthesia Plan: General   Post-op Pain Management:    Induction: Intravenous  Airway Management Planned: Oral ETT  Additional Equipment:   Intra-op Plan:   Post-operative Plan: Extubation in OR  Informed Consent: I have reviewed the patients History and Physical, chart, labs and discussed the procedure including the risks, benefits and alternatives for the proposed anesthesia  with the patient or authorized representative who has indicated his/her understanding and acceptance.   Dental advisory given  Plan Discussed with: CRNA  Anesthesia Plan Comments:         Anesthesia Quick Evaluation

## 2015-05-06 ENCOUNTER — Observation Stay (HOSPITAL_COMMUNITY)
Admission: RE | Admit: 2015-05-06 | Discharge: 2015-05-07 | Disposition: A | Discharge status: Home / Self Care | Payer: Medicare Other | Source: Ambulatory Visit | Attending: Surgery | Admitting: Surgery

## 2015-05-06 ENCOUNTER — Ambulatory Visit (HOSPITAL_COMMUNITY): Payer: Medicare Other | Admitting: Certified Registered Nurse Anesthetist

## 2015-05-06 ENCOUNTER — Encounter (HOSPITAL_COMMUNITY): Payer: Self-pay | Admitting: *Deleted

## 2015-05-06 ENCOUNTER — Encounter (HOSPITAL_COMMUNITY)
Admission: RE | Disposition: A | Discharge status: Home / Self Care | Payer: Self-pay | Source: Ambulatory Visit | Attending: Surgery

## 2015-05-06 DIAGNOSIS — E669 Obesity, unspecified: Secondary | ICD-10-CM | POA: Diagnosis not present

## 2015-05-06 DIAGNOSIS — N200 Calculus of kidney: Secondary | ICD-10-CM | POA: Diagnosis not present

## 2015-05-06 DIAGNOSIS — E210 Primary hyperparathyroidism: Principal | ICD-10-CM | POA: Insufficient documentation

## 2015-05-06 DIAGNOSIS — E039 Hypothyroidism, unspecified: Secondary | ICD-10-CM | POA: Insufficient documentation

## 2015-05-06 DIAGNOSIS — E119 Type 2 diabetes mellitus without complications: Secondary | ICD-10-CM | POA: Insufficient documentation

## 2015-05-06 DIAGNOSIS — I10 Essential (primary) hypertension: Secondary | ICD-10-CM | POA: Diagnosis not present

## 2015-05-06 DIAGNOSIS — Z79899 Other long term (current) drug therapy: Secondary | ICD-10-CM | POA: Diagnosis not present

## 2015-05-06 DIAGNOSIS — G4733 Obstructive sleep apnea (adult) (pediatric): Secondary | ICD-10-CM | POA: Diagnosis not present

## 2015-05-06 DIAGNOSIS — Z7984 Long term (current) use of oral hypoglycemic drugs: Secondary | ICD-10-CM | POA: Insufficient documentation

## 2015-05-06 DIAGNOSIS — K759 Inflammatory liver disease, unspecified: Secondary | ICD-10-CM | POA: Insufficient documentation

## 2015-05-06 DIAGNOSIS — Z8673 Personal history of transient ischemic attack (TIA), and cerebral infarction without residual deficits: Secondary | ICD-10-CM | POA: Insufficient documentation

## 2015-05-06 DIAGNOSIS — K219 Gastro-esophageal reflux disease without esophagitis: Secondary | ICD-10-CM | POA: Insufficient documentation

## 2015-05-06 DIAGNOSIS — J45909 Unspecified asthma, uncomplicated: Secondary | ICD-10-CM | POA: Diagnosis not present

## 2015-05-06 DIAGNOSIS — G4730 Sleep apnea, unspecified: Secondary | ICD-10-CM | POA: Insufficient documentation

## 2015-05-06 HISTORY — PX: PARATHYROIDECTOMY: SHX19

## 2015-05-06 LAB — GLUCOSE, CAPILLARY
Glucose-Capillary: 115 mg/dL — ABNORMAL HIGH (ref 65–99)
Glucose-Capillary: 136 mg/dL — ABNORMAL HIGH (ref 65–99)
Glucose-Capillary: 155 mg/dL — ABNORMAL HIGH (ref 65–99)
Glucose-Capillary: 162 mg/dL — ABNORMAL HIGH (ref 65–99)
Glucose-Capillary: 177 mg/dL — ABNORMAL HIGH (ref 65–99)
Glucose-Capillary: 171 mg/dL — ABNORMAL HIGH (ref 65–99)

## 2015-05-06 SURGERY — PARATHYROIDECTOMY
Anesthesia: General

## 2015-05-06 MED ORDER — BUPIVACAINE HCL 0.25 % IJ SOLN: INTRAMUSCULAR | Status: DC | PRN

## 2015-05-06 MED ORDER — ACETAMINOPHEN 650 MG RE SUPP: 650.0000 mg | Freq: Four times a day (QID) | RECTAL | Status: DC | PRN

## 2015-05-06 MED ORDER — SUGAMMADEX SODIUM 200 MG/2ML IV SOLN
INTRAVENOUS | Status: AC
  Filled 2015-05-06: qty 2

## 2015-05-06 MED ORDER — HYDROMORPHONE HCL 1 MG/ML IJ SOLN
1.0000 mg | INTRAMUSCULAR | Status: DC | PRN
  Administered 2015-05-06: 1 mg via INTRAVENOUS
  Filled 2015-05-06: qty 1

## 2015-05-06 MED ORDER — ACETAMINOPHEN 325 MG PO TABS
650.0000 mg | ORAL_TABLET | Freq: Four times a day (QID) | ORAL | Status: DC | PRN
  Administered 2015-05-07: 650 mg via ORAL
  Filled 2015-05-06: qty 2

## 2015-05-06 MED ORDER — FENTANYL CITRATE (PF) 250 MCG/5ML IJ SOLN
INTRAMUSCULAR | Status: AC
  Filled 2015-05-06: qty 5

## 2015-05-06 MED ORDER — LIDOCAINE HCL (CARDIAC) 20 MG/ML IV SOLN
INTRAVENOUS | Status: DC | PRN
  Administered 2015-05-06: 100 mg via INTRAVENOUS

## 2015-05-06 MED ORDER — EPHEDRINE SULFATE 50 MG/ML IJ SOLN
INTRAMUSCULAR | Status: AC
  Filled 2015-05-06: qty 1

## 2015-05-06 MED ORDER — ONDANSETRON HCL 4 MG/2ML IJ SOLN
INTRAMUSCULAR | Status: DC | PRN
  Administered 2015-05-06: 4 mg via INTRAVENOUS

## 2015-05-06 MED ORDER — ONDANSETRON HCL 4 MG/2ML IJ SOLN: 4.0000 mg | Freq: Four times a day (QID) | INTRAMUSCULAR | Status: DC | PRN

## 2015-05-06 MED ORDER — 0.9 % SODIUM CHLORIDE (POUR BTL) OPTIME
TOPICAL | Status: DC | PRN
  Administered 2015-05-06: 100 mL

## 2015-05-06 MED ORDER — SUCCINYLCHOLINE CHLORIDE 20 MG/ML IJ SOLN
INTRAMUSCULAR | Status: DC | PRN
  Administered 2015-05-06: 140 mg via INTRAVENOUS
  Administered 2015-05-06: 60 mg via INTRAVENOUS

## 2015-05-06 MED ORDER — PROPOFOL 10 MG/ML IV BOLUS
INTRAVENOUS | Status: AC
  Filled 2015-05-06: qty 20

## 2015-05-06 MED ORDER — EPHEDRINE SULFATE 50 MG/ML IJ SOLN
INTRAMUSCULAR | Status: DC | PRN
  Administered 2015-05-06: 10 mg via INTRAVENOUS
  Administered 2015-05-06: 5 mg via INTRAVENOUS

## 2015-05-06 MED ORDER — LIDOCAINE HCL (CARDIAC) 20 MG/ML IV SOLN
INTRAVENOUS | Status: AC
  Filled 2015-05-06: qty 5

## 2015-05-06 MED ORDER — PROPOFOL 10 MG/ML IV BOLUS
INTRAVENOUS | Status: DC | PRN
  Administered 2015-05-06: 30 mg via INTRAVENOUS
  Administered 2015-05-06: 170 mg via INTRAVENOUS

## 2015-05-06 MED ORDER — LACTATED RINGERS IV SOLN
INTRAVENOUS | Status: DC | PRN
  Administered 2015-05-06 (×2): via INTRAVENOUS

## 2015-05-06 MED ORDER — FENTANYL CITRATE (PF) 100 MCG/2ML IJ SOLN: 25.0000 ug | INTRAMUSCULAR | Status: DC | PRN

## 2015-05-06 MED ORDER — ONDANSETRON HCL 4 MG/2ML IJ SOLN
INTRAMUSCULAR | Status: AC
  Filled 2015-05-06: qty 2

## 2015-05-06 MED ORDER — BUPIVACAINE HCL (PF) 0.25 % IJ SOLN
INTRAMUSCULAR | Status: AC
  Filled 2015-05-06: qty 30

## 2015-05-06 MED ORDER — MEPERIDINE HCL 50 MG/ML IJ SOLN: 6.2500 mg | INTRAMUSCULAR | Status: DC | PRN

## 2015-05-06 MED ORDER — PANTOPRAZOLE SODIUM 40 MG PO TBEC
40.0000 mg | DELAYED_RELEASE_TABLET | Freq: Every day | ORAL | Status: DC
  Filled 2015-05-06: qty 1

## 2015-05-06 MED ORDER — SODIUM CHLORIDE 0.9 % IJ SOLN
INTRAMUSCULAR | Status: AC
  Filled 2015-05-06: qty 10

## 2015-05-06 MED ORDER — ONDANSETRON 4 MG PO TBDP: 4.0000 mg | ORAL_TABLET | Freq: Four times a day (QID) | ORAL | Status: DC | PRN

## 2015-05-06 MED ORDER — DEXAMETHASONE SODIUM PHOSPHATE 10 MG/ML IJ SOLN
INTRAMUSCULAR | Status: DC | PRN
  Administered 2015-05-06: 10 mg via INTRAVENOUS

## 2015-05-06 MED ORDER — FENTANYL CITRATE (PF) 100 MCG/2ML IJ SOLN
INTRAMUSCULAR | Status: DC | PRN
  Administered 2015-05-06 (×4): 50 ug via INTRAVENOUS

## 2015-05-06 MED ORDER — LACTATED RINGERS IV SOLN: INTRAVENOUS | Status: DC

## 2015-05-06 MED ORDER — SUGAMMADEX SODIUM 200 MG/2ML IV SOLN
INTRAVENOUS | Status: DC | PRN
  Administered 2015-05-06: 200 mg via INTRAVENOUS

## 2015-05-06 MED ORDER — CARVEDILOL 6.25 MG PO TABS
6.2500 mg | ORAL_TABLET | Freq: Two times a day (BID) | ORAL | Status: DC
  Administered 2015-05-06 – 2015-05-07 (×2): 6.25 mg via ORAL
  Filled 2015-05-06 (×4): qty 1

## 2015-05-06 MED ORDER — CEFAZOLIN SODIUM-DEXTROSE 2-3 GM-% IV SOLR
2.0000 g | INTRAVENOUS | Status: AC
  Administered 2015-05-06: 2 g via INTRAVENOUS

## 2015-05-06 MED ORDER — LOSARTAN POTASSIUM-HCTZ 100-25 MG PO TABS: 1.0000 | ORAL_TABLET | Freq: Every day | ORAL | Status: DC

## 2015-05-06 MED ORDER — HYDROCHLOROTHIAZIDE 25 MG PO TABS
25.0000 mg | ORAL_TABLET | Freq: Every day | ORAL | Status: DC
  Administered 2015-05-06: 25 mg via ORAL
  Filled 2015-05-06 (×2): qty 1

## 2015-05-06 MED ORDER — KCL IN DEXTROSE-NACL 20-5-0.45 MEQ/L-%-% IV SOLN
INTRAVENOUS | Status: DC
  Administered 2015-05-06 – 2015-05-07 (×2): via INTRAVENOUS
  Filled 2015-05-06 (×2): qty 1000

## 2015-05-06 MED ORDER — ROCURONIUM BROMIDE 100 MG/10ML IV SOLN
INTRAVENOUS | Status: DC | PRN
  Administered 2015-05-06: 40 mg via INTRAVENOUS

## 2015-05-06 MED ORDER — CEFAZOLIN SODIUM-DEXTROSE 2-3 GM-% IV SOLR
INTRAVENOUS | Status: AC
  Filled 2015-05-06: qty 50

## 2015-05-06 MED ORDER — HYDROCODONE-ACETAMINOPHEN 5-325 MG PO TABS: 1.0000 | ORAL_TABLET | ORAL | Status: DC | PRN

## 2015-05-06 MED ORDER — GLIMEPIRIDE 2 MG PO TABS
2.0000 mg | ORAL_TABLET | Freq: Every day | ORAL | Status: DC
  Administered 2015-05-07: 2 mg via ORAL
  Filled 2015-05-06 (×2): qty 1

## 2015-05-06 MED ORDER — DEXAMETHASONE SODIUM PHOSPHATE 10 MG/ML IJ SOLN
INTRAMUSCULAR | Status: AC
  Filled 2015-05-06: qty 1

## 2015-05-06 MED ORDER — GLYCOPYRROLATE 0.2 MG/ML IJ SOLN
INTRAMUSCULAR | Status: AC
  Filled 2015-05-06: qty 3

## 2015-05-06 MED ORDER — INSULIN ASPART 100 UNIT/ML ~~LOC~~ SOLN
0.0000 [IU] | Freq: Three times a day (TID) | SUBCUTANEOUS | Status: DC
  Administered 2015-05-06: 3 [IU] via SUBCUTANEOUS
  Administered 2015-05-07: 2 [IU] via SUBCUTANEOUS

## 2015-05-06 MED ORDER — NEOSTIGMINE METHYLSULFATE 10 MG/10ML IV SOLN
INTRAVENOUS | Status: AC
  Filled 2015-05-06: qty 1

## 2015-05-06 MED ORDER — PHENYLEPHRINE 40 MCG/ML (10ML) SYRINGE FOR IV PUSH (FOR BLOOD PRESSURE SUPPORT)
PREFILLED_SYRINGE | INTRAVENOUS | Status: AC
  Filled 2015-05-06: qty 10

## 2015-05-06 MED ORDER — LOSARTAN POTASSIUM 50 MG PO TABS
100.0000 mg | ORAL_TABLET | Freq: Every day | ORAL | Status: DC
  Administered 2015-05-06: 100 mg via ORAL
  Filled 2015-05-06 (×2): qty 2

## 2015-05-06 MED ORDER — LEVOTHYROXINE SODIUM 150 MCG PO TABS
150.0000 ug | ORAL_TABLET | Freq: Every day | ORAL | Status: DC
  Administered 2015-05-06 – 2015-05-07 (×2): 150 ug via ORAL
  Filled 2015-05-06 (×3): qty 1

## 2015-05-06 MED ORDER — PHENYLEPHRINE HCL 10 MG/ML IJ SOLN
INTRAMUSCULAR | Status: DC | PRN
  Administered 2015-05-06: 40 ug via INTRAVENOUS
  Administered 2015-05-06 (×3): 80 ug via INTRAVENOUS

## 2015-05-06 MED ORDER — ROCURONIUM BROMIDE 100 MG/10ML IV SOLN
INTRAVENOUS | Status: AC
  Filled 2015-05-06: qty 1

## 2015-05-06 MED ORDER — ATROPINE SULFATE 0.4 MG/ML IJ SOLN
INTRAMUSCULAR | Status: AC
  Filled 2015-05-06: qty 1

## 2015-05-06 SURGICAL SUPPLY — 38 items
APL SKNCLS STERI-STRIP NONHPOA (GAUZE/BANDAGES/DRESSINGS) ×1
ATTRACTOMAT 16X20 MAGNETIC DRP (DRAPES) ×2 IMPLANT
BENZOIN TINCTURE PRP APPL 2/3 (GAUZE/BANDAGES/DRESSINGS) ×1 IMPLANT
BLADE HEX COATED 2.75 (ELECTRODE) ×2 IMPLANT
BLADE SURG 15 STRL LF DISP TIS (BLADE) ×1 IMPLANT
BLADE SURG 15 STRL SS (BLADE) ×2
CHLORAPREP W/TINT 26ML (MISCELLANEOUS) ×2 IMPLANT
CLIP TI MEDIUM 6 (CLIP) ×3 IMPLANT
CLIP TI WIDE RED SMALL 6 (CLIP) ×4 IMPLANT
COVER SURGICAL LIGHT HANDLE (MISCELLANEOUS) ×2 IMPLANT
DRAPE LAPAROTOMY T 98X78 PEDS (DRAPES) ×2 IMPLANT
DRESSING SURGICEL FIBRLLR 1X2 (HEMOSTASIS) ×1 IMPLANT
DRSG SURGICEL FIBRILLAR 1X2 (HEMOSTASIS) ×2
ELECT PENCIL ROCKER SW 15FT (MISCELLANEOUS) ×2 IMPLANT
ELECT REM PT RETURN 9FT ADLT (ELECTROSURGICAL) ×2
ELECTRODE REM PT RTRN 9FT ADLT (ELECTROSURGICAL) ×1 IMPLANT
GAUZE SPONGE 4X4 12PLY STRL (GAUZE/BANDAGES/DRESSINGS) ×1 IMPLANT
GAUZE SPONGE 4X4 16PLY XRAY LF (GAUZE/BANDAGES/DRESSINGS) ×2 IMPLANT
GLOVE SURG ORTHO 8.0 STRL STRW (GLOVE) ×2 IMPLANT
GOWN STRL REUS W/TWL XL LVL3 (GOWN DISPOSABLE) ×6 IMPLANT
KIT BASIN OR (CUSTOM PROCEDURE TRAY) ×2 IMPLANT
LIQUID BAND (GAUZE/BANDAGES/DRESSINGS) IMPLANT
NDL HYPO 25X1 1.5 SAFETY (NEEDLE) ×1 IMPLANT
NEEDLE HYPO 25X1 1.5 SAFETY (NEEDLE) ×2 IMPLANT
PACK BASIC VI WITH GOWN DISP (CUSTOM PROCEDURE TRAY) ×2 IMPLANT
STAPLER VISISTAT 35W (STAPLE) ×2 IMPLANT
STRIP CLOSURE SKIN 1/2X4 (GAUZE/BANDAGES/DRESSINGS) ×1 IMPLANT
SUT MNCRL AB 4-0 PS2 18 (SUTURE) ×2 IMPLANT
SUT SILK 2 0 (SUTURE)
SUT SILK 2-0 18XBRD TIE 12 (SUTURE) IMPLANT
SUT SILK 3 0 (SUTURE)
SUT SILK 3-0 18XBRD TIE 12 (SUTURE) IMPLANT
SUT VIC AB 3-0 SH 18 (SUTURE) ×3 IMPLANT
SYR BULB IRRIGATION 50ML (SYRINGE) ×2 IMPLANT
SYR CONTROL 10ML LL (SYRINGE) ×2 IMPLANT
TOWEL OR 17X26 10 PK STRL BLUE (TOWEL DISPOSABLE) ×2 IMPLANT
TOWEL OR NON WOVEN STRL DISP B (DISPOSABLE) ×2 IMPLANT
YANKAUER SUCT BULB TIP 10FT TU (MISCELLANEOUS) ×2 IMPLANT

## 2015-05-06 NOTE — Progress Notes (Signed)
RT placed patient on CPAP. Patient home setting is 14 cmH2O. Sterile water added to water chamber for humidification. Patient is tolerating well. RT will continue to monitor.

## 2015-05-06 NOTE — Brief Op Note (Signed)
05/06/2015  9:13 AM  PATIENT:  Karen Potter  74 y.o. female  PRE-OPERATIVE DIAGNOSIS:  Primary Hyperparathyroidism  POST-OPERATIVE DIAGNOSIS:  Primary Hyperparathyroidism  PROCEDURE:  Neck exploration and left superior parathyroidectomy  SURGEON:  Surgeon(s) and Role:    * Armandina Gemma, MD - Primary  ANESTHESIA:   general  EBL:  Total I/O In: 1000 [I.V.:1000] Out: -   BLOOD ADMINISTERED:none  DRAINS: none   LOCAL MEDICATIONS USED:  NONE  SPECIMEN:  Excision  DISPOSITION OF SPECIMEN:  PATHOLOGY  COUNTS:  YES  TOURNIQUET:  * No tourniquets in log *  DICTATION: .Other Dictation: Dictation Number [id number]  PLAN OF CARE: Admit for overnight observation  PATIENT DISPOSITION:  PACU - hemodynamically stable.   Delay start of Pharmacological VTE agent (>24hrs) due to surgical blood loss or risk of bleeding: yes  Earnstine Regal, MD, [facility] Office: [phone]

## 2015-05-06 NOTE — Anesthesia Procedure Notes (Signed)
Procedure Name: Intubation Date/Time: 05/06/2015 7:33 AM Performed by: Montel Clock Pre-anesthesia Checklist: Patient identified, Emergency Drugs available, Suction available, Patient being monitored and Timeout performed Patient Re-evaluated:Patient Re-evaluated prior to inductionOxygen Delivery Method: Circle system utilized Preoxygenation: Pre-oxygenation with 100% oxygen Intubation Type: IV induction Ventilation: Oral airway inserted - appropriate to patient size, Mask ventilation with difficulty and Two handed mask ventilation required Laryngoscope Size: Glidescope and 4 Grade View: Grade I Tube type: Oral Tube size: 7.5 mm Number of attempts: 3 Airway Equipment and Method: Stylet and Video-laryngoscopy Placement Confirmation: ETT inserted through vocal cords under direct vision,  positive ETCO2 and breath sounds checked- equal and bilateral Secured at: 21 cm Tube secured with: Tape Dental Injury: Teeth and Oropharynx as per pre-operative assessment  Comments: Attempt x1 MAC 3, grade 3 view, unable to pass ETT. Attempt x2 MAC3, unable to pass bougie. Attempt x 3 with Glidescope 4 blade, grade 1 view, ETT passed easily through cords. Required 2 hand mask ventilation.

## 2015-05-06 NOTE — Op Note (Signed)
NAMEBREEZIE, Karen Potter              ACCOUNT NO.:  [id number]  MEDICAL RECORD NO.:  [id number]  LOCATION:  44                         FACILITY:  [facility]  PHYSICIAN:  Earnstine Regal, MD      DATE OF BIRTH:  Dec 05, 1940  DATE OF PROCEDURE:  05/06/2015                              OPERATIVE REPORT   PREOPERATIVE DIAGNOSIS:  Primary hyperparathyroidism.  POSTOPERATIVE DIAGNOSIS:  Primary hyperparathyroidism.  PROCEDURE:  Neck exploration with left superior parathyroidectomy.  SURGEON:  Earnstine Regal, MD, FACS  ANESTHESIA:  General.  ESTIMATED BLOOD LOSS:  Minimal.  PREPARATION:  ChloraPrep.  COMPLICATIONS:  None.  INDICATIONS:  The patient is a 74 year old female, referred by Dr. Nicolette Bang from Urology for hypercalcemia and suspected primary hyperparathyroidism.  The patient has a history of nephrolithiasis. Calcium levels have been elevated dating back to 2008 with the highest level being 10.8.  Intact PTH levels ranged from 38 to 43.  The patient underwent nuclear medicine parathyroid scan, which failed to identify a parathyroid adenoma.  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  The patient now comes to Surgery for exploration.  BODY OF REPORT:  Procedure was done in OR #2 at the [facility].  The patient was brought to the operating room, placed in supine position on the operating room table.  Following administration of general anesthesia, the patient was positioned and then prepped and draped in the usual aseptic fashion.  After ascertaining that an adequate level of anesthesia had been achieved, a right anterior neck incision was made with a #15 blade.  Dissection was carried through subcutaneous tissues.  Platysma was divided with the electrocautery.  Skin flaps were developed circumferentially and a Weitlaner retractor placed for exposure.  Strap muscles were incised in the midline and reflected  to the right exposing the inferior pole of the right thyroid lobe.  Right thyroid lobe was small and atrophic. Dissection allows for mobilization of the right lobe.  The thyroid thymic tract was explored and there was no evidence of adenoma. Ultrasound suggests the adenoma to be present behind the right carotid artery.  Palpation in this area does show a nodular mass measuring approximately 1.5 cm in size.  This was explored.  A fatty lobule of tissue was dissected out.  Vascular tributaries were divided between Ligaclips and the nodule was excised in its entirety.  It was sectioned on the table, but appears to be mainly adipose tissue or possibly a lymph node.  It is submitted to Pathology for frozen section, which confirms adipose tissue with some lymphocytic infiltrate.  There was no parathyroid tissue present.  Decision was made to proceed with full neck exploration.  Incision was extended across the midline to the left.  Subplatysmal flaps were elevated cephalad and caudad, and a [place]ner self-retaining retractor was placed for exposure.  Strap muscles were incised in the midline. Right lobe was then completely mobilized and right superior pole completely dissected out.  Dissection was carried back to the spine. The esophagus was dissected out.  Recurrent laryngeal nerve was preserved.  Exploration reveals no sign of parathyroid adenoma on the right side.  Dry pack was  placed in the right neck.  Next, we turned our attention to the left neck.  Left thyroid lobe was also atrophic.  It was gently mobilized.  Exploration inferiorly in the thyrothymic tract shows no evidence of parathyroid adenoma.  The lateral neck is mobilized down to the spine.  Exploration posterior to the superior pole reveals an enlarged parathyroid gland measuring approximately 1 cm in diameter.  This was gently dissected out. Vascular structures divided between small Ligaclips.  The gland was excised.  It was  enlarged.  It has the morphologic appearance of a small adenoma.  It was submitted to Pathology where the pathologist confirms parathyroid tissue, which appears hypercellular and weighs 325 mg.  Further neck exploration was unrevealing.  Good hemostasis was obtained throughout the neck.  Neck was irrigated with warm saline and evacuated. Fibrillar was placed throughout the operative field.  Strap muscles were reapproximated in the midline with interrupted 3-0 Vicryl sutures. Platysma was closed with interrupted 3-0 Vicryl sutures.  Skin was closed with a running 4-0 Monocryl subcuticular suture.  Wound was washed and dried, and benzoin and Steri-Strips were applied.  Sterile dressings were applied.  The patient was awakened from anesthesia and brought to the recovery room.  The patient tolerated the procedure well.  Earnstine Regal, MD, [facility] Office: [phone]   TMG/MEDQ  D:  05/06/2015  T:  05/06/2015  Job:  [id number]  cc:   Nicolette Bang, MD [facility]

## 2015-05-06 NOTE — Anesthesia Postprocedure Evaluation (Signed)
Anesthesia Post Note  Patient: Karen Potter  Procedure(s) Performed: Procedure(s) (LRB): PARATHYROIDECTOMY (N/A)  Patient location during evaluation: PACU Anesthesia Type: General Level of consciousness: awake and alert Pain management: pain level controlled Vital Signs Assessment: post-procedure vital signs reviewed and stable Respiratory status: spontaneous breathing, nonlabored ventilation, respiratory function stable and patient connected to nasal cannula oxygen Cardiovascular status: blood pressure returned to baseline and stable Postop Assessment: no signs of nausea or vomiting Anesthetic complications: no    Last Vitals:  Filed Vitals:   05/06/15 1130 05/06/15 1230  BP: 129/62 127/70  Pulse: 83 84  Temp: 36.8 C 36.4 C  Resp: 18 16    Last Pain:  Filed Vitals:   05/06/15 1240  PainSc: 0-No pain                 Effie Berkshire

## 2015-05-06 NOTE — Interval H&P Note (Signed)
History and Physical Interval Note:  05/06/2015 6:59 AM  Karen Potter  has presented today for surgery, with the diagnosis of Primary Hyperparathyroidism.  The various methods of treatment have been discussed with the patient and family. After consideration of risks, benefits and other options for treatment, the patient has consented to    Procedure(s): PARATHYROIDECTOMY (N/A) as a surgical intervention .    The patient's history has been reviewed, patient examined, no change in status, stable for surgery.  I have reviewed the patient's chart and labs.  Questions were answered to the patient's satisfaction.    Earnstine Regal, MD, [facility] Office: [place]

## 2015-05-06 NOTE — Transfer of Care (Signed)
Immediate Anesthesia Transfer of Care Note  Patient: Karen Potter  Procedure(s) Performed: Procedure(s): PARATHYROIDECTOMY (N/A)  Patient Location: PACU  Anesthesia Type:General  Level of Consciousness:  sedated, patient cooperative and responds to stimulation  Airway & Oxygen Therapy:Patient Spontanous Breathing and Patient connected to face mask oxgen  Post-op Assessment:  Report given to PACU RN and Post -op Vital signs reviewed and stable  Post vital signs:  Reviewed and stable  Last Vitals:  Filed Vitals:   05/06/15 0527  BP: 115/60  Pulse: 68  Temp: 36.4 C  Resp: 18    Complications: No apparent anesthesia complications

## 2015-05-07 DIAGNOSIS — K759 Inflammatory liver disease, unspecified: Secondary | ICD-10-CM | POA: Diagnosis not present

## 2015-05-07 DIAGNOSIS — K219 Gastro-esophageal reflux disease without esophagitis: Secondary | ICD-10-CM | POA: Diagnosis not present

## 2015-05-07 DIAGNOSIS — E039 Hypothyroidism, unspecified: Secondary | ICD-10-CM | POA: Diagnosis not present

## 2015-05-07 DIAGNOSIS — G4730 Sleep apnea, unspecified: Secondary | ICD-10-CM | POA: Diagnosis not present

## 2015-05-07 DIAGNOSIS — E210 Primary hyperparathyroidism: Secondary | ICD-10-CM | POA: Diagnosis not present

## 2015-05-07 DIAGNOSIS — Z79899 Other long term (current) drug therapy: Secondary | ICD-10-CM | POA: Diagnosis not present

## 2015-05-07 DIAGNOSIS — E119 Type 2 diabetes mellitus without complications: Secondary | ICD-10-CM | POA: Diagnosis not present

## 2015-05-07 DIAGNOSIS — J45909 Unspecified asthma, uncomplicated: Secondary | ICD-10-CM | POA: Diagnosis not present

## 2015-05-07 DIAGNOSIS — I10 Essential (primary) hypertension: Secondary | ICD-10-CM | POA: Diagnosis not present

## 2015-05-07 DIAGNOSIS — Z8673 Personal history of transient ischemic attack (TIA), and cerebral infarction without residual deficits: Secondary | ICD-10-CM | POA: Diagnosis not present

## 2015-05-07 DIAGNOSIS — Z7984 Long term (current) use of oral hypoglycemic drugs: Secondary | ICD-10-CM | POA: Diagnosis not present

## 2015-05-07 DIAGNOSIS — N200 Calculus of kidney: Secondary | ICD-10-CM | POA: Diagnosis not present

## 2015-05-07 LAB — BASIC METABOLIC PANEL
Anion gap: 11 (ref 5–15)
BUN: 19 mg/dL (ref 6–20)
CO2: 26 mmol/L (ref 22–32)
Calcium: 9 mg/dL (ref 8.9–10.3)
Chloride: 102 mmol/L (ref 101–111)
Creatinine, Ser: 0.82 mg/dL (ref 0.44–1.00)
GFR calc Af Amer: >60 mL/min (ref >60)
GFR calc non Af Amer: >60 mL/min (ref >60)
Glucose, Bld: 140 mg/dL — ABNORMAL HIGH (ref 65–99)
Potassium: 3.9 mmol/L (ref 3.5–5.1)
Sodium: 139 mmol/L (ref 135–145)

## 2015-05-07 LAB — GLUCOSE, CAPILLARY: Glucose-Capillary: 150 mg/dL — ABNORMAL HIGH (ref 65–99)

## 2015-05-07 MED ORDER — HYDROCODONE-ACETAMINOPHEN 5-325 MG PO TABS: 1.0000 | ORAL_TABLET | ORAL | Status: DC | PRN

## 2015-05-07 NOTE — Progress Notes (Signed)
Patient alert and oriented, with pain controlled. Patient given discharge instructions and prescriptions. All questions answered. MD called to clarify orders for synthroid order. Continue as prescribed at home.

## 2015-05-07 NOTE — Care Management Note (Signed)
Case Management Note  Patient Details  Name: Karen Potter MRN: [id number] Date of Birth: 1940/08/02  Subjective/Objective:     S/p neck exploration and parathyroidectomy               Action/Plan: Discharge planning, no HH needs identified  Expected Discharge Date:  05/07/15               Expected Discharge Plan:  Home/Self Care  In-House Referral:  NA  Discharge planning Services  CM Consult  Post Acute Care Choice:  NA Choice offered to:  NA  DME Arranged:  N/A DME Agency:  NA  HH Arranged:  NA HH Agency:  NA  Status of Service:  Completed, signed off  Medicare Important Message Given:    Date Medicare IM Given:    Medicare IM give by:    Date Additional Medicare IM Given:    Additional Medicare Important Message give by:     If discussed at [place] of Stay Meetings, dates discussed:    Additional Comments:  Guadalupe Maple, RN 05/07/2015, 11:04 AM

## 2015-05-07 NOTE — Discharge Summary (Signed)
Physician Discharge Summary [facility]  Patient ID: Karen Potter MRN: [id number] DOB/AGE: April 03, 1941 74 y.o.  Admit date: 05/06/2015 Discharge date: 05/07/2015  Admission Diagnoses:  Primary hyperparathyroidism  Discharge Diagnoses:  Principal Problem:   Hyperparathyroidism, primary (Early) Active Problems:   Primary hyperparathyroidism [facility])   Discharged Condition: good  Hospital Course: Patient was admitted for observation following parathyroid surgery.  Post op course was uncomplicated.  Pain was well controlled.  Tolerated diet.  Post op calcium level on morning following surgery was 9.0 mg/dl.  Patient was prepared for discharge home on POD#1.  Consults: None  Treatments: surgery: neck exploration and parathyroidectomy  Discharge Exam: Blood pressure 113/62, pulse 66, temperature 98 F (36.7 C), temperature source Oral, resp. rate 16, height 5' 4.5" (1.638 m), weight 111.131 kg (245 lb), SpO2 96 %. HEENT - clear Neck - wound dry and intact; mild STS; voice slightly hoarse  Disposition: Home  Discharge Instructions    Apply dressing    Complete by:  As directed   Apply light gauze dressing to wound before discharge home today.     Diet - low sodium heart healthy    Complete by:  As directed      Discharge instructions    Complete by:  As directed   [facility]  THYROID & PARATHYROID SURGERY:  POST-OP INSTRUCTIONS  Always review your discharge instruction sheet from the facility where your surgery was performed.  A prescription for pain medication may be given to you upon discharge.  Take your pain medication as prescribed.  If narcotic pain medicine is not needed, then you may take acetaminophen (Tylenol) or ibuprofen (Advil) as needed.  Take your usually prescribed medications unless otherwise directed.  If you need a refill on your pain medication, please contact your pharmacy. They will contact our office to request  authorization.  Prescriptions will not be processed by our office after 5 pm or on weekends.  Start with a light diet upon arrival home, such as soup and crackers or toast.  Be sure to drink plenty of fluids daily.  Resume your normal diet the day after surgery.  Most patients will experience some swelling and bruising on the chest and neck area.  Ice packs will help.  Swelling and bruising can take several days to resolve.   It is common to experience some constipation after surgery.  Increasing fluid intake and taking a stool softener will usually help or prevent this problem.  A mild laxative (Milk of Magnesia or Miralax) should be taken according to package directions if there has been no bowel movement after 48 hours.  You have steri-strips and a gauze dressing over your incision.  You may remove the gauze bandage on the second day after surgery, and you may shower at that time.  Leave your steri-strips (small skin tapes) in place directly over the incision.  These strips should remain on the skin for 5-7 days and then be removed.  You may get them wet in the shower and pat them dry.  You may resume regular (light) daily activities beginning the next day - such as daily self-care, walking, climbing stairs - gradually increasing activities as tolerated.  You may have sexual intercourse when it is comfortable.  Refrain from any heavy lifting or straining until approved by your doctor.  You may drive when you no longer are taking prescription pain medication, you can comfortably wear a seatbelt, and you can safely maneuver your  car and apply brakes.  You should see your doctor in the office for a follow-up appointment approximately two to three weeks after your surgery.  Make sure that you call for this appointment within a day or two after you arrive home to insure a convenient appointment time.  WHEN TO CALL YOUR DOCTOR: -- Fever greater than 101.5 -- Inability to urinate -- Nausea and/or  vomiting - persistent -- Extreme swelling or bruising -- Continued bleeding from incision -- Increased pain, redness, or drainage from the incision -- Difficulty swallowing or breathing -- Muscle cramping or spasms -- Numbness or tingling in hands or around lips  The clinic staff is available to answer your questions during regular business hours.  Please don't hesitate to call and ask to speak to one of the nurses if you have concerns.  Earnstine Regal, MD, [facility] Office: [phone]  Website: [email or web address]     Ice pack    Complete by:  As directed      Increase activity slowly    Complete by:  As directed      Remove dressing in 24 hours    Complete by:  As directed             Medication List    TAKE these medications        aspirin EC 325 MG tablet  Take 325 mg by mouth daily.     CALCIUM + D PO  Take 1-2 tablets by mouth 2 (two) times daily. Take 2 tablets in the morning and 1 tablet at night     carvedilol 6.25 MG tablet  Commonly known as:  COREG  TAKE ONE TABLET BY MOUTH TWICE DAILY WITH A MEAL     clobetasol cream 0.05 %  Commonly known as:  TEMOVATE  Apply 1 application topically daily as needed (for skinning of skin). app to vaginal area     clotrimazole-betamethasone cream  Commonly known as:  LOTRISONE  Apply 1 application topically 2 (two) times daily.     DHA ALGAL-900 PO  Take 1 tablet by mouth 2 (two) times daily.     FISH OIL TRIPLE STRENGTH 1400 MG Caps  Take 1,400 mg by mouth daily.     glimepiride 2 MG tablet  Commonly known as:  AMARYL  TAKE ONE TABLET BY MOUTH ONCE DAILY BEFORE BREAKFAST     glucose blood test strip  Test BS 1 time a day     guaiFENesin 600 MG 12 hr tablet  Commonly known as:  MUCINEX  Take 600 mg by mouth 2 (two) times daily as needed for cough or to loosen phlegm.     HYDROcodone-acetaminophen 5-325 MG tablet  Commonly known as:   NORCO/VICODIN  Take 1-2 tablets by mouth every 4 (four) hours as needed for moderate pain.     levothyroxine 150 MCG tablet  Commonly known as:  SYNTHROID, LEVOTHROID  TAKE ONE TABLET BY MOUTH ONCE DAILY     levothyroxine 175 MCG tablet  Commonly known as:  SYNTHROID, LEVOTHROID  TAKE ONE TABLET BY MOUTH ONCE DAILY     losartan-hydrochlorothiazide 100-25 MG tablet  Commonly known as:  HYZAAR  TAKE ONE TABLET BY MOUTH ONCE DAILY     Magnesium 250 MG Tabs  Take 250 mg by mouth daily.     omeprazole 20 MG capsule  Commonly known as:  PRILOSEC  Take 20 mg by mouth daily.     ONE  TOUCH LANCETS Misc  Patient to check BS once daily     Vitamin D3 2000 units capsule  Take 2,000 Units by mouth daily.           Follow-up Information    Follow up with Earnstine Regal, MD. Schedule an appointment as soon as possible for a visit in 3 weeks.   Specialty:  General Surgery   Why:  For wound re-check   Contact information:   [place] [address] [phone]       Earnstine Regal, MD, [facility] Office: [phone]   Signed: Earnstine Regal 05/07/2015, 7:48 AM

## 2015-05-07 NOTE — Care Management Obs Status (Signed)
[facility] NOTIFICATION   Patient Details  Name: ALVERTA MCGORTY MRN: [id number] Date of Birth: [birth year]   Medicare Observation Status Notification Given:  Yes    Guadalupe Maple, RN 05/07/2015, 11:10 AM

## 2015-05-08 ENCOUNTER — Other Ambulatory Visit (INDEPENDENT_AMBULATORY_CARE_PROVIDER_SITE_OTHER): Payer: Medicare Other

## 2015-05-08 NOTE — Progress Notes (Signed)
Lab only 

## 2015-05-09 LAB — CALCIUM: Calcium: 8.9 mg/dL (ref 8.7–10.3)

## 2015-05-13 ENCOUNTER — Telehealth: Payer: Self-pay | Admitting: Family Medicine

## 2015-05-13 NOTE — Telephone Encounter (Signed)
It is hard to diagnose a rash over the phone. It is also difficult to tell if she has a strep throat without checking her. Tell her to take extra strength Tylenol every 4-6 hours if she is not better tomorrow have her seen tomorrow by a provider

## 2015-05-13 NOTE — Telephone Encounter (Signed)
As per note

## 2015-05-13 NOTE — Telephone Encounter (Signed)
Patient given appointment for tomorrow with Dettinger.

## 2015-05-13 NOTE — Telephone Encounter (Signed)
Since surgery her hands and feet have been really cold, she has had a rash since Friday and a sore throat. She has not taken any medications since surgery. She stated that last night she was having some chest discomfort. She states that she took an ASA and went to bed and the pain went to bed. She is not having any chest pain at this time. Please advise. Patient states that she called surgeons office and they stated to follow up with [place]. We have no available appointments left and would like to see what you recommend.

## 2015-05-14 ENCOUNTER — Encounter: Payer: Self-pay | Admitting: Family Medicine

## 2015-05-14 ENCOUNTER — Ambulatory Visit (INDEPENDENT_AMBULATORY_CARE_PROVIDER_SITE_OTHER): Payer: Medicare Other | Admitting: Family Medicine

## 2015-05-14 VITALS — BP 114/71 | HR 67 | Temp 97.0°F | Ht 64.5 in | Wt 238.6 lb

## 2015-05-14 DIAGNOSIS — L259 Unspecified contact dermatitis, unspecified cause: Secondary | ICD-10-CM

## 2015-05-14 DIAGNOSIS — J029 Acute pharyngitis, unspecified: Secondary | ICD-10-CM | POA: Diagnosis not present

## 2015-05-14 LAB — POCT RAPID STREP A (OFFICE): Rapid Strep A Screen: NEGATIVE

## 2015-05-14 MED ORDER — TRIAMCINOLONE ACETONIDE 0.1 % EX CREA: 1.0000 "application " | TOPICAL_CREAM | Freq: Two times a day (BID) | CUTANEOUS | Status: DC

## 2015-05-14 MED ORDER — PHENOL-GLYCERIN 1.5-33 % MT LIQD: 1.0000 | Freq: Three times a day (TID) | OROMUCOSAL | Status: DC | PRN

## 2015-05-14 NOTE — Progress Notes (Signed)
BP 114/71 mmHg  Pulse 67  Temp(Src) 97 F (36.1 C) (Oral)  Ht 5' 4.5" (1.638 m)  Wt 238 lb 9.6 oz (108.228 kg)  BMI 40.34 kg/m2   Subjective:    Patient ID: Karen Potter, female    DOB: November 26, 1940, 75 y.o.   MRN: [id number]  HPI: Karen Potter is a 75 y.o. female presenting on 05/14/2015 for Sore Throat and Rash on back and shoulders   HPI Sore throat Patient has been having a sore throat for the past week. She denies any cough, fevers, chills, nasal congestion, sinus congestion or postnasal drainage. She just did have a surgical procedure about a week ago with a removed one of her parathyroid glands when she was intubated. She denies any chest congestion or wheezing or shortness of breath.  Rash Also for the past week she has had a rash on the back of her neck and upper part of her shoulders and upper back that is small fine bumps. The rash has not traveled anywhere else. The rash has not changed or worsened but also not improved over the past week. The rash was more pruritic at first but is less so now. There is no drainage from the rash or pain associated with it.  Relevant past medical, surgical, family and social history reviewed and updated as indicated. Interim medical history since our last visit reviewed. Allergies and medications reviewed and updated.  Review of Systems  Constitutional: Negative for fever and chills.  HENT: Positive for sore throat. Negative for congestion, ear discharge, ear pain, mouth sores, postnasal drip, rhinorrhea and sinus pressure.   Eyes: Negative for redness and visual disturbance.  Respiratory: Negative for cough, chest tightness, shortness of breath and wheezing.   Cardiovascular: Negative for chest pain, palpitations and leg swelling.  Genitourinary: Negative for dysuria and difficulty urinating.  Musculoskeletal: Negative for back pain and gait problem.  Skin: Positive for rash.  Neurological: Negative for light-headedness and  headaches.  Psychiatric/Behavioral: Negative for behavioral problems and agitation.  All other systems reviewed and are negative.   Per HPI unless specifically indicated above     Medication List       This list is accurate as of: 05/14/15 10:33 AM.  Always use your most recent med list.               aspirin EC 325 MG tablet  Take 325 mg by mouth daily.     CALCIUM + D PO  Take 1-2 tablets by mouth 2 (two) times daily. Take 2 tablets in the morning and 1 tablet at night     carvedilol 6.25 MG tablet  Commonly known as:  COREG  TAKE ONE TABLET BY MOUTH TWICE DAILY WITH A MEAL     clobetasol cream 0.05 %  Commonly known as:  TEMOVATE  Apply 1 application topically daily as needed (for skinning of skin). app to vaginal area     clotrimazole-betamethasone cream  Commonly known as:  LOTRISONE  Apply 1 application topically 2 (two) times daily.     DHA ALGAL-900 PO  Take 1 tablet by mouth 2 (two) times daily.     FISH OIL TRIPLE STRENGTH 1400 MG Caps  Take 1,400 mg by mouth daily.     glimepiride 2 MG tablet  Commonly known as:  AMARYL  TAKE ONE TABLET BY MOUTH ONCE DAILY BEFORE BREAKFAST     glucose blood test strip  Test BS 1 time a day  guaiFENesin 600 MG 12 hr tablet  Commonly known as:  MUCINEX  Take 600 mg by mouth 2 (two) times daily as needed for cough or to loosen phlegm.     HYDROcodone-acetaminophen 5-325 MG tablet  Commonly known as:  NORCO/VICODIN  Take 1-2 tablets by mouth every 4 (four) hours as needed for moderate pain.     levothyroxine 150 MCG tablet  Commonly known as:  SYNTHROID, LEVOTHROID  TAKE ONE TABLET BY MOUTH ONCE DAILY     levothyroxine 175 MCG tablet  Commonly known as:  SYNTHROID, LEVOTHROID  TAKE ONE TABLET BY MOUTH ONCE DAILY     losartan-hydrochlorothiazide 100-25 MG tablet  Commonly known as:  HYZAAR  TAKE ONE TABLET BY MOUTH ONCE DAILY     Magnesium 250 MG Tabs  Take 250 mg by mouth daily.     ONE TOUCH LANCETS  Misc  Patient to check BS once daily     Phenol-Glycerin 1.5-33 % Liqd  Commonly known as:  CHLORASEPTIC MAX SORE THROAT  Use as directed 1 spray in the mouth or throat 3 (three) times daily as needed.     triamcinolone cream 0.1 %  Commonly known as:  KENALOG  Apply 1 application topically 2 (two) times daily.     Vitamin D3 2000 units capsule  Take 2,000 Units by mouth daily.           Objective:    BP 114/71 mmHg  Pulse 67  Temp(Src) 97 F (36.1 C) (Oral)  Ht 5' 4.5" (1.638 m)  Wt 238 lb 9.6 oz (108.228 kg)  BMI 40.34 kg/m2  Wt Readings from Last 3 Encounters:  05/14/15 238 lb 9.6 oz (108.228 kg)  05/06/15 245 lb (111.131 kg)  04/28/15 245 lb (111.131 kg)    Physical Exam  Constitutional: She is oriented to person, place, and time. She appears well-developed and well-nourished. No distress.  HENT:  Right Ear: Tympanic membrane and ear canal normal.  Left Ear: Tympanic membrane and ear canal normal.  Nose: Right sinus exhibits no maxillary sinus tenderness and no frontal sinus tenderness. Left sinus exhibits maxillary sinus tenderness. Left sinus exhibits no frontal sinus tenderness.  Mouth/Throat: Uvula is midline and mucous membranes are normal. Posterior oropharyngeal edema present. No oropharyngeal exudate, posterior oropharyngeal erythema or tonsillar abscesses.  Eyes: Conjunctivae and EOM are normal. Pupils are equal, round, and reactive to light.  Neck: Neck supple. No thyromegaly present.  Cardiovascular: Normal rate, regular rhythm, normal heart sounds and intact distal pulses.   No murmur heard. Pulmonary/Chest: Effort normal and breath sounds normal. No respiratory distress. She has no wheezes.  Musculoskeletal: Normal range of motion. She exhibits no edema or tenderness.  Lymphadenopathy:    She has no cervical adenopathy.  Neurological: She is alert and oriented to person, place, and time. Coordination normal.  Skin: Skin is warm and dry. No rash noted.  She is not diaphoretic.  Psychiatric: She has a normal mood and affect. Her behavior is normal.  Nursing note and vitals reviewed.   Results for orders placed or performed in visit on 05/14/15  POCT rapid strep A  Result Value Ref Range   Rapid Strep A Screen Negative Negative      Assessment & Plan:   Problem List Items Addressed This Visit    None    Visit Diagnoses    Sore throat    -  Primary    Strep is negative, I think from tube that was in her throat  for surgery, use Chloraseptic Spray and should improve in a week or so    Relevant Orders    POCT rapid strep A (Completed)    Contact dermatitis        Likely came in contact with something through the surgery just in her neck region. Treat with triamcinolone    Relevant Medications    triamcinolone cream (KENALOG) 0.1 %        Follow up plan: Return if symptoms worsen or fail to improve.  Counseling provided for all of the vaccine components Orders Placed This Encounter  Procedures  . POCT rapid strep A    Caryl Pina, MD [facility] 05/14/2015, 10:33 AM

## 2015-05-15 ENCOUNTER — Other Ambulatory Visit: Payer: Self-pay

## 2015-05-15 MED ORDER — OMEPRAZOLE 20 MG PO CPDR: 20.0000 mg | DELAYED_RELEASE_CAPSULE | Freq: Every day | ORAL | Status: DC

## 2015-05-15 NOTE — Telephone Encounter (Signed)
Last seen 05/14/15 Dr Dettinger  This med not on EPIC list

## 2015-05-23 ENCOUNTER — Encounter: Payer: Self-pay | Admitting: Family Medicine

## 2015-05-23 ENCOUNTER — Ambulatory Visit (INDEPENDENT_AMBULATORY_CARE_PROVIDER_SITE_OTHER): Payer: Medicare Other | Admitting: Family Medicine

## 2015-05-23 VITALS — BP 105/59 | HR 72 | Temp 97.4°F | Ht 64.5 in | Wt 243.0 lb

## 2015-05-23 DIAGNOSIS — J209 Acute bronchitis, unspecified: Secondary | ICD-10-CM | POA: Diagnosis not present

## 2015-05-23 MED ORDER — BUDESONIDE 32 MCG/ACT NA SUSP: 2.0000 | Freq: Every day | NASAL | Status: DC

## 2015-05-23 MED ORDER — ALBUTEROL SULFATE HFA 108 (90 BASE) MCG/ACT IN AERS: 2.0000 | INHALATION_SPRAY | Freq: Four times a day (QID) | RESPIRATORY_TRACT | Status: DC | PRN

## 2015-05-23 MED ORDER — AZITHROMYCIN 250 MG PO TABS
ORAL_TABLET | ORAL | Status: DC
Start: 2015-05-23 — End: 2015-06-12

## 2015-05-23 NOTE — Progress Notes (Signed)
BP 105/59 mmHg  Pulse 72  Temp(Src) 97.4 F (36.3 C) (Oral)  Ht 5' 4.5" (1.638 m)  Wt 243 lb (110.224 kg)  BMI 41.08 kg/m2   Subjective:    Patient ID: Karen Potter, female    DOB: 11-06-1940, 75 y.o.   MRN: [id number]  HPI: Karen Potter is a 75 y.o. female presenting on 05/23/2015 for Cough and Nasal Congestion   HPI Cough and nasal congestion Patient has been having 2 days of cough and nasal congestion and postnasal drainage and sinus pressure. Her cough is nonproductive and she denies any fevers or chills or shortness of breath or wheezing. She has tried over-the-counter Delsym and Mucinex but does not feel like they're working just yet. Her basic complaint is a dry cough that she is having that is been very persistent for her.  Relevant past medical, surgical, family and social history reviewed and updated as indicated. Interim medical history since our last visit reviewed. Allergies and medications reviewed and updated.  Review of Systems  Constitutional: Negative for fever and chills.  HENT: Positive for congestion, postnasal drip, rhinorrhea, sinus pressure and sore throat. Negative for ear discharge, ear pain and sneezing.   Eyes: Negative for pain, redness and visual disturbance.  Respiratory: Positive for cough. Negative for chest tightness and shortness of breath.   Cardiovascular: Negative for chest pain and leg swelling.  Genitourinary: Negative for dysuria and difficulty urinating.  Musculoskeletal: Negative for back pain and gait problem.  Skin: Negative for rash.  Neurological: Negative for light-headedness and headaches.  Psychiatric/Behavioral: Negative for behavioral problems and agitation.  All other systems reviewed and are negative.   Per HPI unless specifically indicated above     Medication List       This list is accurate as of: 05/23/15 12:49 PM.  Always use your most recent med list.               albuterol 108 (90 Base) MCG/ACT  inhaler  Commonly known as:  PROVENTIL HFA;VENTOLIN HFA  Inhale 2 puffs into the lungs every 6 (six) hours as needed for wheezing or shortness of breath.     aspirin EC 325 MG tablet  Take 325 mg by mouth daily.     azithromycin 250 MG tablet  Commonly known as:  ZITHROMAX  Take 2 the first day and then one each day after.     budesonide 32 MCG/ACT nasal spray  Commonly known as:  RHINOCORT AQUA  Place 2 sprays into both nostrils daily.     CALCIUM + D PO  Take 1-2 tablets by mouth 2 (two) times daily. Take 2 tablets in the morning and 1 tablet at night     carvedilol 6.25 MG tablet  Commonly known as:  COREG  TAKE ONE TABLET BY MOUTH TWICE DAILY WITH A MEAL     clobetasol cream 0.05 %  Commonly known as:  TEMOVATE  Apply 1 application topically daily as needed (for skinning of skin). app to vaginal area     clotrimazole-betamethasone cream  Commonly known as:  LOTRISONE  Apply 1 application topically 2 (two) times daily.     DHA ALGAL-900 PO  Take 1 tablet by mouth 2 (two) times daily.     FISH OIL TRIPLE STRENGTH 1400 MG Caps  Take 1,400 mg by mouth daily.     glimepiride 2 MG tablet  Commonly known as:  AMARYL  TAKE ONE TABLET BY MOUTH ONCE DAILY BEFORE BREAKFAST  glucose blood test strip  Test BS 1 time a day     guaiFENesin 600 MG 12 hr tablet  Commonly known as:  MUCINEX  Take 600 mg by mouth 2 (two) times daily as needed for cough or to loosen phlegm.     HYDROcodone-acetaminophen 5-325 MG tablet  Commonly known as:  NORCO/VICODIN  Take 1-2 tablets by mouth every 4 (four) hours as needed for moderate pain.     levothyroxine 150 MCG tablet  Commonly known as:  SYNTHROID, LEVOTHROID  TAKE ONE TABLET BY MOUTH ONCE DAILY     levothyroxine 175 MCG tablet  Commonly known as:  SYNTHROID, LEVOTHROID  TAKE ONE TABLET BY MOUTH ONCE DAILY     losartan-hydrochlorothiazide 100-25 MG tablet  Commonly known as:  HYZAAR  TAKE ONE TABLET BY MOUTH ONCE DAILY      Magnesium 250 MG Tabs  Take 250 mg by mouth daily.     omeprazole 20 MG capsule  Commonly known as:  PRILOSEC  Take 1 capsule (20 mg total) by mouth daily.     ONE TOUCH LANCETS Misc  Patient to check BS once daily     Phenol-Glycerin 1.5-33 % Liqd  Commonly known as:  CHLORASEPTIC MAX SORE THROAT  Use as directed 1 spray in the mouth or throat 3 (three) times daily as needed.     triamcinolone cream 0.1 %  Commonly known as:  KENALOG  Apply 1 application topically 2 (two) times daily.     Vitamin D3 2000 units capsule  Take 2,000 Units by mouth daily.           Objective:    BP 105/59 mmHg  Pulse 72  Temp(Src) 97.4 F (36.3 C) (Oral)  Ht 5' 4.5" (1.638 m)  Wt 243 lb (110.224 kg)  BMI 41.08 kg/m2  Wt Readings from Last 3 Encounters:  05/23/15 243 lb (110.224 kg)  05/14/15 238 lb 9.6 oz (108.228 kg)  05/06/15 245 lb (111.131 kg)    Physical Exam  Constitutional: She is oriented to person, place, and time. She appears well-developed and well-nourished. No distress.  HENT:  Right Ear: Tympanic membrane, external ear and ear canal normal.  Left Ear: Tympanic membrane, external ear and ear canal normal.  Nose: Mucosal edema and rhinorrhea present. No epistaxis. Right sinus exhibits no maxillary sinus tenderness and no frontal sinus tenderness. Left sinus exhibits no maxillary sinus tenderness and no frontal sinus tenderness.  Mouth/Throat: Uvula is midline and mucous membranes are normal. Posterior oropharyngeal edema and posterior oropharyngeal erythema present. No oropharyngeal exudate or tonsillar abscesses.  Eyes: Conjunctivae and EOM are normal.  Neck: Neck supple. No thyromegaly present.  Cardiovascular: Normal rate, regular rhythm, normal heart sounds and intact distal pulses.   No murmur heard. Pulmonary/Chest: Effort normal and breath sounds normal. No respiratory distress. She has no wheezes.  Musculoskeletal: Normal range of motion. She exhibits no edema or  tenderness.  Lymphadenopathy:    She has no cervical adenopathy.  Neurological: She is alert and oriented to person, place, and time. Coordination normal.  Skin: Skin is warm and dry. No rash noted. She is not diaphoretic.  Psychiatric: She has a normal mood and affect. Her behavior is normal.  Nursing note and vitals reviewed.   Results for orders placed or performed in visit on 05/14/15  POCT rapid strep A  Result Value Ref Range   Rapid Strep A Screen Negative Negative      Assessment & Plan:   Problem  List Items Addressed This Visit    None    Visit Diagnoses    Acute bronchitis, unspecified organism    -  Primary    Patient will try Flonase and albuterol inhaler and Mucinex and antihistamine first if not better in 2 or 3 days then will pick up azithromycin    Relevant Medications    budesonide (RHINOCORT AQUA) 32 MCG/ACT nasal spray    albuterol (PROVENTIL HFA;VENTOLIN HFA) 108 (90 Base) MCG/ACT inhaler    azithromycin (ZITHROMAX) 250 MG tablet        Follow up plan: Return if symptoms worsen or fail to improve.  Counseling provided for all of the vaccine components No orders of the defined types were placed in this encounter.    Caryl Pina, MD [facility] 05/23/2015, 12:49 PM

## 2015-05-25 ENCOUNTER — Other Ambulatory Visit: Payer: Self-pay | Admitting: Family Medicine

## 2015-05-27 ENCOUNTER — Other Ambulatory Visit: Payer: Medicare Other

## 2015-05-28 LAB — CALCIUM: Calcium: 9.6 mg/dL (ref 8.7–10.3)

## 2015-05-28 LAB — PARATHYROID HORMONE, INTACT (NO CA): PTH: 14 pg/mL — ABNORMAL LOW (ref 15–65)

## 2015-05-29 ENCOUNTER — Telehealth: Payer: Self-pay

## 2015-05-29 MED ORDER — ALBUTEROL SULFATE HFA 108 (90 BASE) MCG/ACT IN AERS: 2.0000 | INHALATION_SPRAY | Freq: Four times a day (QID) | RESPIRATORY_TRACT | Status: DC | PRN

## 2015-05-29 NOTE — Telephone Encounter (Signed)
Patient aware.

## 2015-05-29 NOTE — Telephone Encounter (Signed)
Go ahead and send the patient pro-air instead of Proventil, I don't get why they don't automatically substitute. The Rhinocort or budesonide is over-the-counter so if her insurance doesn't pay for it she can pick it up over-the-counter. Caryl Pina, MD [facility] 05/29/2015, 11:23 AM

## 2015-05-29 NOTE — Telephone Encounter (Signed)
Insurance denied prior authorization for Proventil HFA  Must first try and fail Proair HFA or [place] denied prior authorization for Budesonide  Denied due to not being supported by [employer] and Drug administration for your medical condition

## 2015-06-02 DIAGNOSIS — N200 Calculus of kidney: Secondary | ICD-10-CM | POA: Diagnosis not present

## 2015-06-02 DIAGNOSIS — R3915 Urgency of urination: Secondary | ICD-10-CM | POA: Diagnosis not present

## 2015-06-02 DIAGNOSIS — Z0000 Encounter for general adult medical examination without abnormal findings: Secondary | ICD-10-CM | POA: Diagnosis not present

## 2015-06-12 ENCOUNTER — Encounter: Payer: Self-pay | Admitting: Family Medicine

## 2015-06-12 ENCOUNTER — Ambulatory Visit (INDEPENDENT_AMBULATORY_CARE_PROVIDER_SITE_OTHER): Payer: Medicare Other | Admitting: Family Medicine

## 2015-06-12 VITALS — BP 115/66 | HR 69 | Temp 97.2°F | Ht 64.5 in | Wt 244.0 lb

## 2015-06-12 DIAGNOSIS — E119 Type 2 diabetes mellitus without complications: Secondary | ICD-10-CM | POA: Diagnosis not present

## 2015-06-12 DIAGNOSIS — E559 Vitamin D deficiency, unspecified: Secondary | ICD-10-CM

## 2015-06-12 DIAGNOSIS — E785 Hyperlipidemia, unspecified: Secondary | ICD-10-CM

## 2015-06-12 DIAGNOSIS — K219 Gastro-esophageal reflux disease without esophagitis: Secondary | ICD-10-CM

## 2015-06-12 DIAGNOSIS — I10 Essential (primary) hypertension: Secondary | ICD-10-CM

## 2015-06-12 DIAGNOSIS — E039 Hypothyroidism, unspecified: Secondary | ICD-10-CM | POA: Diagnosis not present

## 2015-06-12 LAB — POCT GLYCOSYLATED HEMOGLOBIN (HGB A1C): Hemoglobin A1C: 6.1

## 2015-06-12 MED ORDER — OMEPRAZOLE 20 MG PO CPDR: 20.0000 mg | DELAYED_RELEASE_CAPSULE | Freq: Every day | ORAL | Status: DC

## 2015-06-12 NOTE — Progress Notes (Signed)
Subjective:    Patient ID: Karen Potter, female    DOB: Jan 19, 1941, 75 y.o.   MRN: [id number]  HPI Pt here for follow up and management of chronic medical problems which includes diabetes, hypothyroid, and hyperlipidemia. She is taking medications regularly. She had surgery to remove her parathyroid glands a little over one month ago. Her calcium and PTH have normalized. She is having some swallowing difficulties that seems to be improving. She had exploration of her neck to locate parathyroid glands and I suspect that has something to do with her dysphagia. It is improving by her history. We reviewed her medicines and I think she probably could leave off magnesium and possibly calcium. There've been some recent reports a calcium supplements may contribute to cardiac coronary calcifications and it might be better to get calcium through food intake rather than supplements.      Patient Active Problem List   Diagnosis Date Noted  . Primary hyperparathyroidism ([place]) 05/06/2015  . Hyperparathyroidism, primary ([place]) 05/05/2015  . Diabetes mellitus without complication ([place])   . Chest pain 04/02/2014  . GERD (gastroesophageal reflux disease) 11/05/2013  . Fatty liver 06/01/2013  . Hypercalcemia 05/14/2013  . Abnormal transaminases 05/14/2013  . Hot flashes 05/14/2013  . Hyperglycemia   . Hyperlipidemia   . Hypertension   . Hypothyroid   . Migraine   . Polio   . Amnesia   . Seasonal allergies   . Vitamin D deficiency   . Obesity   . Aphasia 04/11/2012  . TIA (transient ischemic attack) 04/11/2012  . 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 OBSTRUCTIVE SLEEP APNEA 05/31/2008  . Essential hypertension 05/31/2008  . OVARIAN CYST 05/31/2008  . CHOLECYSTECTOMY, HX OF 05/31/2008  . DYSPNEA 06/01/2007  . CHEST PAIN 06/01/2007   Outpatient Encounter Prescriptions as of 06/12/2015  Medication Sig  . albuterol (PROVENTIL HFA;VENTOLIN HFA) 108 (90 Base) MCG/ACT inhaler Inhale 2 puffs into the  lungs every 6 (six) hours as needed for wheezing or shortness of breath.  Marland Kitchen aspirin EC 325 MG tablet Take 325 mg by mouth daily.  . budesonide (RHINOCORT AQUA) 32 MCG/ACT nasal spray Place 2 sprays into both nostrils daily.  . Calcium Citrate-Vitamin D (CALCIUM + D PO) Take 1-2 tablets by mouth 2 (two) times daily. Take 2 tablets in the morning and 1 tablet at night  . carvedilol (COREG) 6.25 MG tablet TAKE ONE TABLET BY MOUTH TWICE DAILY WITH  A  MEAL  . Cholecalciferol (VITAMIN D3) 2000 UNITS capsule Take 2,000 Units by mouth daily.  . clobetasol cream (TEMOVATE) 2.54 % Apply 1 application topically daily as needed (for skinning of skin). app to vaginal area  . clotrimazole-betamethasone (LOTRISONE) cream Apply 1 application topically 2 (two) times daily. (Patient taking differently: Apply 1 application topically 3 (three) times daily as needed (for yeast infection; apply under breast and groin areas). )  . Docosahexaenoic Acid (DHA ALGAL-900 PO) Take 1 tablet by mouth 2 (two) times daily.  Marland Kitchen glimepiride (AMARYL) 2 MG tablet TAKE ONE TABLET BY MOUTH ONCE DAILY BEFORE BREAKFAST  . glucose blood test strip Test BS 1 time a day  . guaiFENesin (MUCINEX) 600 MG 12 hr tablet Take 600 mg by mouth 2 (two) times daily as needed for cough or to loosen phlegm.   Marland Kitchen HYDROcodone-acetaminophen (NORCO/VICODIN) 5-325 MG tablet Take 1-2 tablets by mouth every 4 (four) hours as needed for moderate pain.  Marland Kitchen levothyroxine (SYNTHROID, LEVOTHROID) 150 MCG tablet TAKE ONE TABLET BY MOUTH ONCE DAILY (Patient  taking differently: TAKE ONE TABLET BY MOUTH ONCE DAILY on Fridays)  . levothyroxine (SYNTHROID, LEVOTHROID) 175 MCG tablet TAKE ONE TABLET BY MOUTH ONCE DAILY (Patient taking differently: TAKE ONE TABLET BY MOUTH ONCE DAILY Sunday - Thursday (take 160mg on Friday))  . losartan-hydrochlorothiazide (HYZAAR) 100-25 MG tablet TAKE ONE TABLET BY MOUTH ONCE DAILY  . Magnesium 250 MG TABS Take 250 mg by mouth daily.   .  Omega-3 Fatty Acids (FISH OIL TRIPLE STRENGTH) 1400 MG CAPS Take 1,400 mg by mouth daily.   .Marland Kitchenomeprazole (PRILOSEC) 20 MG capsule Take 1 capsule (20 mg total) by mouth daily.  .Marland KitchenONE TOUCH LANCETS MISC Patient to check BS once daily  . [DISCONTINUED] azithromycin (ZITHROMAX) 250 MG tablet Take 2 the first day and then one each day after.  . [DISCONTINUED] Phenol-Glycerin (CHLORASEPTIC MAX SORE THROAT) 1.5-33 % LIQD Use as directed 1 spray in the mouth or throat 3 (three) times daily as needed.  . [DISCONTINUED] triamcinolone cream (KENALOG) 0.1 % Apply 1 application topically 2 (two) times daily.   No facility-administered encounter medications on file as of 06/12/2015.      Review of Systems  Constitutional: Negative.   HENT: Negative.   Eyes: Negative.   Respiratory: Negative.   Cardiovascular: Negative.   Gastrointestinal: Negative.   Endocrine: Negative.   Genitourinary: Negative.   Musculoskeletal: Negative.   Skin: Negative.   Allergic/Immunologic: Negative.   Neurological: Negative.   Hematological: Negative.   Psychiatric/Behavioral: Negative.        Objective:   Physical Exam  Constitutional: She is oriented to person, place, and time. She appears well-developed and well-nourished.  Cardiovascular: Normal rate and regular rhythm.   Pulmonary/Chest: Effort normal and breath sounds normal.  Neurological: She is alert and oriented to person, place, and time.  Psychiatric: She has a normal mood and affect. Her behavior is normal.   BP 115/66 mmHg  Pulse 69  Temp(Src) 97.2 F (36.2 C) (Oral)  Ht 5' 4.5" (1.638 m)  Wt 244 lb (110.678 kg)  BMI 41.25 kg/m2        Assessment & Plan:  1. Vitamin D deficiency She is to continue taking just routine supplements  2. Essential hypertension Pressure is well managed at 115/66 on carvedilol and losartan - CMP14+EGFR  3. Gastroesophageal reflux disease without esophagitis She has seen GI before. Problems postoperatively  seem to be improving  4. Type 2 diabetes mellitus without complication, without long-term current use of insulin (HCC) A1c was good at 6.3. Will wait until next visit to recheck - POCT glycosylated hemoglobin (Hb A1C) - CMP14+EGFR  5. Hypothyroidism, unspecified hypothyroidism type She is on 2 different thyroid supplements need to monitor TSH - TSH  6. Hyperlipidemia Pins were at goal in September. - Lipid panel \\Elma Limas MLoleta ChanceMD

## 2015-06-12 NOTE — Patient Instructions (Signed)
Medicare Annual Wellness Visit  [place] and the medical providers at [facility] strive to bring you the best medical care.  In doing so we not only want to address your current medical conditions and concerns but also to detect new conditions early and prevent illness, disease and health-related problems.    Medicare offers a yearly Wellness Visit which allows our clinical staff to assess your need for preventative services including immunizations, lifestyle education, counseling to decrease risk of preventable diseases and screening for fall risk and other medical concerns.    This visit is provided free of charge (no copay) for all Medicare recipients. The clinical pharmacists at [facility] have begun to conduct these Wellness Visits which will also include a thorough review of all your medications.    As you primary medical provider recommend that you make an appointment for your Annual Wellness Visit if you have not done so already this year.  You may set up this appointment before you leave today or you may call back ([phone]) and schedule an appointment.  Please make sure when you call that you mention that you are scheduling your Annual Wellness Visit with the clinical pharmacist so that the appointment may be made for the proper length of time.     Continue current medications. Continue good therapeutic lifestyle changes which include good diet and exercise. Fall precautions discussed with patient. If an FOBT was given today- please return it to our front desk. If you are over 50 years old - you may need Prevnar 13 or the adult Pneumonia vaccine.  **Flu shots are available--- please call and schedule a FLU-CLINIC appointment**  After your visit with us today you will receive a survey in the mail or online from [employer] regarding your care with us. Please take a moment to fill this out. Your feedback is very  important to us as you can help us better understand your patient needs as well as improve your experience and satisfaction. WE CARE ABOUT YOU!!!    

## 2015-06-13 LAB — CMP14+EGFR
ALT: 42 IU/L — ABNORMAL HIGH (ref 0–32)
AST: 31 IU/L (ref 0–40)
Albumin/Globulin Ratio: 1.3 (ref 1.1–2.5)
Albumin: 3.9 g/dL (ref 3.5–4.8)
Alkaline Phosphatase: 80 IU/L (ref 39–117)
BUN/Creatinine Ratio: 20 (ref 11–26)
BUN: 15 mg/dL (ref 8–27)
Bilirubin Total: 0.3 mg/dL (ref 0.0–1.2)
CO2: 22 mmol/L (ref 18–29)
Calcium: 9.5 mg/dL (ref 8.7–10.3)
Chloride: 99 mmol/L (ref 96–106)
Creatinine, Ser: 0.76 mg/dL (ref 0.57–1.00)
GFR calc Af Amer: 90 mL/min/{1.73_m2} (ref >59)
GFR calc non Af Amer: 78 mL/min/{1.73_m2} (ref >59)
Globulin, Total: 3 g/dL (ref 1.5–4.5)
Glucose: 140 mg/dL — ABNORMAL HIGH (ref 65–99)
Potassium: 4 mmol/L (ref 3.5–5.2)
Sodium: 140 mmol/L (ref 134–144)
Total Protein: 6.9 g/dL (ref 6.0–8.5)

## 2015-06-13 LAB — TSH: TSH: 0.847 u[IU]/mL (ref 0.450–4.500)

## 2015-06-13 LAB — LIPID PANEL
Chol/HDL Ratio: 5.8 ratio units — ABNORMAL HIGH (ref 0.0–4.4)
Cholesterol, Total: 161 mg/dL (ref 100–199)
HDL: 28 mg/dL — ABNORMAL LOW (ref >39)
Triglycerides: 555 mg/dL (ref 0–149)

## 2015-08-16 ENCOUNTER — Other Ambulatory Visit: Payer: Self-pay | Admitting: Family Medicine

## 2015-08-27 ENCOUNTER — Telehealth: Payer: Self-pay | Admitting: Family Medicine

## 2015-08-27 NOTE — Telephone Encounter (Signed)
Patient called stating tat she has had multiple black stool with red blood when wiping for 5 days.  Appt made for tomorrow 4/20

## 2015-08-28 ENCOUNTER — Ambulatory Visit (INDEPENDENT_AMBULATORY_CARE_PROVIDER_SITE_OTHER): Payer: Medicare Other | Admitting: Family

## 2015-08-28 ENCOUNTER — Encounter: Payer: Self-pay | Admitting: Family

## 2015-08-28 VITALS — BP 122/72 | HR 80 | Temp 98.8°F | Ht 64.5 in | Wt 248.0 lb

## 2015-08-28 DIAGNOSIS — R5383 Other fatigue: Secondary | ICD-10-CM

## 2015-08-28 DIAGNOSIS — R195 Other fecal abnormalities: Secondary | ICD-10-CM | POA: Diagnosis not present

## 2015-08-28 LAB — FINGERSTICK HEMOGLOBIN: Hemoglobin: 8.8 g/dL — ABNORMAL LOW (ref 11.1–15.9)

## 2015-08-28 NOTE — Patient Instructions (Signed)
Fatigue  Fatigue is feeling tired all of the time, a lack of energy, or a lack of motivation. Occasional or mild fatigue is often a normal response to activity or life in general. However, long-lasting (chronic) or extreme fatigue may indicate an underlying medical condition.  HOME CARE INSTRUCTIONS   Watch your fatigue for any changes. The following actions may help to lessen any discomfort you are feeling:  · Talk to your health care provider about how much sleep you need each night. Try to get the required amount every night.  · Take medicines only as directed by your health care provider.  · Eat a healthy and nutritious diet. Ask your health care provider if you need help changing your diet.  · Drink enough fluid to keep your urine clear or pale yellow.  · Practice ways of relaxing, such as yoga, meditation, massage therapy, or acupuncture.  · Exercise regularly.    · Change situations that cause you stress. Try to keep your work and personal routine reasonable.  · Do not abuse illegal drugs.  · Limit alcohol intake to no more than 1 drink per day for nonpregnant women and 2 drinks per day for men. One drink equals 12 ounces of beer, 5 ounces of wine, or 1½ ounces of hard liquor.  · Take a multivitamin, if directed by your health care provider.  SEEK MEDICAL CARE IF:   · Your fatigue does not get better.  · You have a fever.    · You have unintentional weight loss or gain.  · You have headaches.    · You have difficulty:      Falling asleep.    Sleeping throughout the night.  · You feel angry, guilty, anxious, or sad.     · You are unable to have a bowel movement (constipation).    · You skin is dry.     · Your legs or another part of your body is swollen.    SEEK IMMEDIATE MEDICAL CARE IF:   · You feel confused.    · Your vision is blurry.  · You feel faint or pass out.    · You have a severe headache.    · You have severe abdominal, pelvic, or back pain.    · You have chest pain, shortness of breath, or an  irregular or fast heartbeat.    · You are unable to urinate or you urinate less than normal.    · You develop abnormal bleeding, such as bleeding from the rectum, vagina, nose, lungs, or nipples.  · You vomit blood.     · You have thoughts about harming yourself or committing suicide.    · You are worried that you might harm someone else.       This information is not intended to replace advice given to you by your health care provider. Make sure you discuss any questions you have with your health care provider.     Document Released: 02/21/2007 Document Revised: 05/17/2014 Document Reviewed: 08/28/2013  Elsevier Interactive Patient Education ©2016 [employer] Inc[employer]

## 2015-08-28 NOTE — Progress Notes (Signed)
   Subjective:    Patient ID: Karen Potter, female    DOB: May 19, 1940, 75 y.o.   MRN: [id number]  HPI Pt presents to the office today with dark/black stools for the last week. PT states she thought this was related to eating "black jelly beans". Pt states she is very fatigue and weak, and SOB. Pt denies any chest pain, or hematuria.    Review of Systems  Constitutional: Positive for fatigue.  Respiratory: Positive for shortness of breath.   All other systems reviewed and are negative.      Objective:   Physical Exam  Constitutional: She is oriented to person, place, and time. She appears well-developed and well-nourished. No distress.  HENT:  Head: Normocephalic.  Eyes: Pupils are equal, round, and reactive to light.  Neck: Normal range of motion. Neck supple. No thyromegaly present.  Cardiovascular: Normal rate, regular rhythm, normal heart sounds and intact distal pulses.   No murmur heard. Pulmonary/Chest: Effort normal and breath sounds normal. No respiratory distress. She has no wheezes.  Abdominal: Soft. Bowel sounds are normal. She exhibits no distension. There is no tenderness.  Musculoskeletal: Normal range of motion. She exhibits no edema or tenderness.  Neurological: She is alert and oriented to person, place, and time.  Skin: Skin is warm and dry.  Psychiatric: She has a normal mood and affect. Her behavior is normal. Judgment and thought content normal.  Vitals reviewed.   BP 122/72 mmHg  Pulse 80  Temp(Src) 98.8 F (37.1 C) (Oral)  Ht 5' 4.5" (1.638 m)  Wt 248 lb (112.492 kg)  BMI 41.93 kg/m2       Assessment & Plan:  1. Dark stools -Labs pending -Increase fluid - Anemia Profile B - Fingerstick Hemoglobin - Fecal occult blood, imunochemical; Future  2. Other fatigue - Anemia Profile B - Fingerstick Hemoglobin - Fecal occult blood, imunochemical; Future - Thyroid Panel With TSH  Evelina Dun, FNP

## 2015-08-29 ENCOUNTER — Encounter (HOSPITAL_COMMUNITY): Payer: Self-pay

## 2015-08-29 ENCOUNTER — Encounter (HOSPITAL_COMMUNITY)
Admission: EM | Disposition: A | Discharge status: Home / Self Care | Payer: Self-pay | Source: Home / Self Care | Attending: Emergency Medicine

## 2015-08-29 ENCOUNTER — Emergency Department (HOSPITAL_COMMUNITY)
Admission: EM | Admit: 2015-08-29 | Discharge: 2015-08-29 | Disposition: A | Discharge status: Home / Self Care | Payer: Medicare Other | Attending: Emergency Medicine | Admitting: Emergency Medicine

## 2015-08-29 DIAGNOSIS — Z6841 Body Mass Index (BMI) 40.0 and over, adult: Secondary | ICD-10-CM | POA: Diagnosis not present

## 2015-08-29 DIAGNOSIS — E669 Obesity, unspecified: Secondary | ICD-10-CM | POA: Diagnosis not present

## 2015-08-29 DIAGNOSIS — Z8612 Personal history of poliomyelitis: Secondary | ICD-10-CM | POA: Insufficient documentation

## 2015-08-29 DIAGNOSIS — G4730 Sleep apnea, unspecified: Secondary | ICD-10-CM | POA: Insufficient documentation

## 2015-08-29 DIAGNOSIS — K269 Duodenal ulcer, unspecified as acute or chronic, without hemorrhage or perforation: Secondary | ICD-10-CM | POA: Diagnosis not present

## 2015-08-29 DIAGNOSIS — E785 Hyperlipidemia, unspecified: Secondary | ICD-10-CM | POA: Insufficient documentation

## 2015-08-29 DIAGNOSIS — K219 Gastro-esophageal reflux disease without esophagitis: Secondary | ICD-10-CM | POA: Insufficient documentation

## 2015-08-29 DIAGNOSIS — E119 Type 2 diabetes mellitus without complications: Secondary | ICD-10-CM | POA: Diagnosis not present

## 2015-08-29 DIAGNOSIS — E039 Hypothyroidism, unspecified: Secondary | ICD-10-CM | POA: Diagnosis not present

## 2015-08-29 DIAGNOSIS — E559 Vitamin D deficiency, unspecified: Secondary | ICD-10-CM | POA: Insufficient documentation

## 2015-08-29 DIAGNOSIS — K760 Fatty (change of) liver, not elsewhere classified: Secondary | ICD-10-CM | POA: Diagnosis not present

## 2015-08-29 DIAGNOSIS — K3189 Other diseases of stomach and duodenum: Secondary | ICD-10-CM | POA: Diagnosis not present

## 2015-08-29 DIAGNOSIS — K259 Gastric ulcer, unspecified as acute or chronic, without hemorrhage or perforation: Secondary | ICD-10-CM | POA: Diagnosis not present

## 2015-08-29 DIAGNOSIS — Z8619 Personal history of other infectious and parasitic diseases: Secondary | ICD-10-CM | POA: Insufficient documentation

## 2015-08-29 DIAGNOSIS — K921 Melena: Secondary | ICD-10-CM | POA: Diagnosis present

## 2015-08-29 DIAGNOSIS — I10 Essential (primary) hypertension: Secondary | ICD-10-CM | POA: Diagnosis not present

## 2015-08-29 DIAGNOSIS — G43909 Migraine, unspecified, not intractable, without status migrainosus: Secondary | ICD-10-CM | POA: Diagnosis not present

## 2015-08-29 DIAGNOSIS — K2960 Other gastritis without bleeding: Secondary | ICD-10-CM | POA: Insufficient documentation

## 2015-08-29 DIAGNOSIS — K922 Gastrointestinal hemorrhage, unspecified: Secondary | ICD-10-CM

## 2015-08-29 DIAGNOSIS — Z8673 Personal history of transient ischemic attack (TIA), and cerebral infarction without residual deficits: Secondary | ICD-10-CM | POA: Diagnosis not present

## 2015-08-29 DIAGNOSIS — Z7982 Long term (current) use of aspirin: Secondary | ICD-10-CM | POA: Diagnosis not present

## 2015-08-29 HISTORY — PX: ESOPHAGOGASTRODUODENOSCOPY: SHX5428

## 2015-08-29 LAB — ANEMIA PROFILE B
Basophils Absolute: 0 10*3/uL (ref 0.0–0.2)
Basos: 0 %
EOS (ABSOLUTE): 0.3 10*3/uL (ref 0.0–0.4)
Eos: 3 %
Ferritin: 37 ng/mL (ref 15–150)
Folate: 6.7 ng/mL (ref >3.0)
Hematocrit: 25.7 % — ABNORMAL LOW (ref 34.0–46.6)
Hemoglobin: 8.8 g/dL — CL (ref 11.1–15.9)
Immature Grans (Abs): 0 10*3/uL (ref 0.0–0.1)
Immature Granulocytes: 1 %
Iron Saturation: 11 % — ABNORMAL LOW (ref 15–55)
Iron: 36 ug/dL (ref 27–139)
Lymphocytes Absolute: 3 10*3/uL (ref 0.7–3.1)
Lymphs: 41 %
MCH: 30.8 pg (ref 26.6–33.0)
MCHC: 34.2 g/dL (ref 31.5–35.7)
MCV: 90 fL (ref 79–97)
Monocytes Absolute: 1 10*3/uL — ABNORMAL HIGH (ref 0.1–0.9)
Monocytes: 13 %
Neutrophils Absolute: 3.2 10*3/uL (ref 1.4–7.0)
Neutrophils: 42 %
Platelets: 317 10*3/uL (ref 150–379)
RBC: 2.86 x10E6/uL — ABNORMAL LOW (ref 3.77–5.28)
RDW: 14.9 % (ref 12.3–15.4)
Retic Ct Pct: 4.3 % — ABNORMAL HIGH (ref 0.6–2.6)
Total Iron Binding Capacity: 326 ug/dL (ref 250–450)
UIBC: 290 ug/dL (ref 118–369)
Vitamin B-12: 262 pg/mL (ref 211–946)
WBC: 7.5 10*3/uL (ref 3.4–10.8)

## 2015-08-29 LAB — CBC WITH DIFFERENTIAL/PLATELET
Basophils Absolute: 0 10*3/uL (ref 0.0–0.1)
Basophils Relative: 0 %
Eosinophils Absolute: 0.2 10*3/uL (ref 0.0–0.7)
Eosinophils Relative: 3 %
HCT: 26.2 % — ABNORMAL LOW (ref 36.0–46.0)
Hemoglobin: 9 g/dL — ABNORMAL LOW (ref 12.0–15.0)
Lymphocytes Relative: 40 %
Lymphs Abs: 2.6 10*3/uL (ref 0.7–4.0)
MCH: 30.9 pg (ref 26.0–34.0)
MCHC: 34.4 g/dL (ref 30.0–36.0)
MCV: 90 fL (ref 78.0–100.0)
Monocytes Absolute: 0.8 10*3/uL (ref 0.1–1.0)
Monocytes Relative: 12 %
Neutro Abs: 3 10*3/uL (ref 1.7–7.7)
Neutrophils Relative %: 46 %
Platelets: 317 10*3/uL (ref 150–400)
RBC: 2.91 MIL/uL — ABNORMAL LOW (ref 3.87–5.11)
RDW: 14.8 % (ref 11.5–15.5)
WBC: 6.7 10*3/uL (ref 4.0–10.5)

## 2015-08-29 LAB — SAMPLE TO BLOOD BANK

## 2015-08-29 LAB — BASIC METABOLIC PANEL
Anion gap: 9 (ref 5–15)
BUN: 17 mg/dL (ref 6–20)
CO2: 25 mmol/L (ref 22–32)
Calcium: 9 mg/dL (ref 8.9–10.3)
Chloride: 108 mmol/L (ref 101–111)
Creatinine, Ser: 0.82 mg/dL (ref 0.44–1.00)
GFR calc Af Amer: >60 mL/min (ref >60)
GFR calc non Af Amer: >60 mL/min (ref >60)
Glucose, Bld: 120 mg/dL — ABNORMAL HIGH (ref 65–99)
Potassium: 3.7 mmol/L (ref 3.5–5.1)
Sodium: 142 mmol/L (ref 135–145)

## 2015-08-29 LAB — POC OCCULT BLOOD, ED: Fecal Occult Bld: NEGATIVE

## 2015-08-29 LAB — THYROID PANEL WITH TSH
Free Thyroxine Index: 2.8 (ref 1.2–4.9)
T3 Uptake Ratio: 26 % (ref 24–39)
T4, Total: 10.7 ug/dL (ref 4.5–12.0)
TSH: 0.738 u[IU]/mL (ref 0.450–4.500)

## 2015-08-29 SURGERY — EGD (ESOPHAGOGASTRODUODENOSCOPY)
Anesthesia: Moderate Sedation

## 2015-08-29 MED ORDER — MIDAZOLAM HCL 5 MG/5ML IJ SOLN
INTRAMUSCULAR | Status: DC | PRN
  Administered 2015-08-29 (×2): 2 mg via INTRAVENOUS

## 2015-08-29 MED ORDER — DIPHENHYDRAMINE HCL 50 MG/ML IJ SOLN
INTRAMUSCULAR | Status: DC | PRN
  Administered 2015-08-29: 12.5 mg via INTRAVENOUS

## 2015-08-29 MED ORDER — MEPERIDINE HCL 100 MG/ML IJ SOLN
INTRAMUSCULAR | Status: DC
Start: 2015-08-29 — End: 2015-08-29
  Filled 2015-08-29: qty 2

## 2015-08-29 MED ORDER — OMEPRAZOLE 20 MG PO CPDR: 20.0000 mg | DELAYED_RELEASE_CAPSULE | Freq: Two times a day (BID) | ORAL | Status: DC

## 2015-08-29 MED ORDER — STERILE WATER FOR IRRIGATION IR SOLN
Status: DC | PRN
  Administered 2015-08-29: 15:00:00

## 2015-08-29 MED ORDER — SODIUM CHLORIDE 0.9 % IV SOLN
80.0000 mg | Freq: Once | INTRAVENOUS | Status: AC
  Administered 2015-08-29: 80 mg via INTRAVENOUS
  Filled 2015-08-29: qty 80

## 2015-08-29 MED ORDER — LIDOCAINE VISCOUS 2 % MT SOLN
OROMUCOSAL | Status: AC
  Filled 2015-08-29: qty 15

## 2015-08-29 MED ORDER — MEPERIDINE HCL 100 MG/ML IJ SOLN
INTRAMUSCULAR | Status: DC | PRN
  Administered 2015-08-29 (×2): 25 mg via INTRAVENOUS

## 2015-08-29 MED ORDER — DIPHENHYDRAMINE HCL 50 MG/ML IJ SOLN
INTRAMUSCULAR | Status: AC
  Filled 2015-08-29: qty 1

## 2015-08-29 MED ORDER — SODIUM CHLORIDE 0.9 % IV SOLN
INTRAVENOUS | Status: DC | PRN
  Administered 2015-08-29: 1000 mL via INTRAVENOUS

## 2015-08-29 MED ORDER — SODIUM CHLORIDE 0.9 % IV SOLN: INTRAVENOUS | Status: DC

## 2015-08-29 MED ORDER — MIDAZOLAM HCL 5 MG/5ML IJ SOLN
INTRAMUSCULAR | Status: AC
  Filled 2015-08-29: qty 10

## 2015-08-29 NOTE — ED Notes (Signed)
Pt reports has been having dark stools x 1 week.  Pt saw pcp yesterday and was told her hemoglobin was 8.8.  Pt reports feels very weak all over.

## 2015-08-29 NOTE — Consult Note (Addendum)
Referring Provider: No ref. provider found Primary Care Physician:  Wardell Honour, MD Primary Gastroenterologist:  Barney Drain  Reason for Consultation:  melena   Impression: PT CAME TO ED WITH DROP IN Hb ASSOCIATED WITH BLACK TARRY STOOLS LIKELY DUE TO PUD, LESS LIKELY AVM OR DIEULAFOY'S LESION.  Plan: 1. EGD TODAY.DISCUSSED PROCEDURE, BENEFITS, & RISKS: < 1% chance of medication reaction, PERFORATION, OR bleeding. 2. PROTONIX 80 MG IV NOW 3. TYPE AND SCREEN NOW      HPI:  PT TOOK ASA FOR YEARS DUE TO SPELLS OF AMNESIA. ON ASA 325 MG DAILY. STOPPED PRILOSEC BECAUSE SHE HEARD IT COULD CAUSE PROBLEMS. SHE STARTED SEEING LARGE AMOUNT OF BLACK TARRY STOOLS LAST WEEK. ASSOCIATED WITH FATIGUE AND LETHARGY. LAST SAW BLACK CLOTS THIS AM. HAD MILD EPIGASTRIC PAIN LAST WEEK AND TOOK TWO PRILOSEC. [place] PL[place] IN THE [place]. PT DENIES FEVER, CHILLS, HEMATOCHEZIA, HEMATEMESIS, nausea, vomiting, diarrhea, CHEST PAIN, SHORTNESS OF BREATH, CHANGE IN BOWEL IN HABITS, constipation, problems swallowing, problems with sedation, OR heartburn. Last meal yesterday. Last TCS YEARS AGO.   Past Medical History  Diagnosis Date  . Amnesia   . Polio   . Migraine   . Hyperglycemia   . Hyperlipidemia   . Hypertension   . Hypothyroid   . Seasonal allergies   . Vitamin D deficiency   . Obesity   . Fatty liver   . Diabetes mellitus without complication ([place])   . Sleep apnea     uses c-pap machine since 2003  . TIA (transient ischemic attack)   . Shortness of breath dyspnea   . Chronic kidney disease     kidney stones  . GERD (gastroesophageal reflux disease)   . 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 Hepatitis     hx of Hep B    Past Surgical History  Procedure Laterality Date  . Abdominal hysterectomy    . Cholecystectomy    . Carpal tunnel release    . Back surgery    . Knee surgery      right  . Bladder surgery    . Ovarian cyst;  polio (3 surgeries)    .  Parathyroidectomy N/A 05/06/2015    Procedure: PARATHYROIDECTOMY;  Surgeon: Armandina Gemma, MD;  Location: [facility];  Service: General;  Laterality: N/A;    Prior to Admission medications   Medication Sig Start Date End Date Taking? Authorizing Provider  albuterol (PROVENTIL HFA;VENTOLIN HFA) 108 (90 Base) MCG/ACT inhaler Inhale 2 puffs into the lungs every 6 (six) hours as needed for wheezing or shortness of breath. 05/29/15  Yes Fransisca Kaufmann Dettinger, MD  aspirin EC 325 MG tablet Take 325 mg by mouth daily.   Yes Historical Provider, MD  budesonide (RHINOCORT AQUA) 32 MCG/ACT nasal spray Place 2 sprays into both nostrils daily. 05/23/15  Yes Fransisca Kaufmann Dettinger, MD  carvedilol (COREG) 6.25 MG tablet TAKE ONE TABLET BY MOUTH TWICE DAILY WITH  A  MEAL 05/26/15  Yes Wardell Honour, MD  Cholecalciferol (VITAMIN D3) 2000 UNITS capsule Take 2,000 Units by mouth daily.   Yes Historical Provider, MD  clobetasol cream (TEMOVATE) [id number] % Apply 1 application topically daily as needed (for skinning of skin). Reported on 08/28/2015   Yes Historical Provider, MD  clotrimazole-betamethasone (LOTRISONE) cream Apply 1 application topically 2 (two) times daily. 11/05/13  Yes Sharion Balloon, FNP  Docosahexaenoic Acid (DHA ALGAL-900 PO) Take 1 tablet by mouth 2 (two) times  daily.   Yes Historical Provider, MD  glimepiride (AMARYL) 2 MG tablet TAKE ONE TABLET BY MOUTH ONCE DAILY BEFORE BREAKFAST 08/18/15  Yes Wardell Honour, MD  glucose blood test strip Test BS 1 time a day 12/31/14  Yes Wardell Honour, MD  guaiFENesin (MUCINEX) 600 MG 12 hr tablet Take 600 mg by mouth 2 (two) times daily as needed for cough or to loosen phlegm.    Yes Historical Provider, MD  levothyroxine (SYNTHROID, LEVOTHROID) 150 MCG tablet TAKE ONE TABLET BY MOUTH ONCE DAILY Patient taking differently: TAKE ONE TABLET BY MOUTH ONCE DAILY on Fridays 05/08/14  Yes Wardell Honour, MD  levothyroxine (SYNTHROID, LEVOTHROID) 175 MCG tablet TAKE ONE TABLET  BY MOUTH ONCE DAILY Patient taking differently: TAKE ONE TABLET BY MOUTH ONCE DAILY Sunday - Thursday (take 110mcg on Friday) 02/10/15  Yes Wardell Honour, MD  losartan-hydrochlorothiazide (HYZAAR) 100-25 MG tablet TAKE ONE TABLET BY MOUTH ONCE DAILY 08/18/15  Yes Wardell Honour, MD  Omega-3 Fatty Acids (FISH OIL TRIPLE STRENGTH) 1400 MG CAPS Take 1,400 mg by mouth daily.    Yes Historical Provider, MD  omeprazole (PRILOSEC) 20 MG capsule Take 1 capsule (20 mg total) by mouth daily. Patient taking differently: Take 20 mg by mouth daily. Patient takes several a month 06/12/15  Yes Wardell Honour, MD  ONE TOUCH LANCETS MISC Patient to check BS once daily 12/31/14  Yes Wardell Honour, MD    No current facility-administered medications for this encounter.   Current Outpatient Prescriptions  Medication Sig Dispense Refill  . albuterol (PROVENTIL HFA;VENTOLIN HFA) 108 (90 Base) MCG/ACT inhaler Inhale 2 puffs into the lungs every 6 (six) hours as needed for wheezing or shortness of breath. 1 Inhaler 0  . aspirin EC 325 MG tablet Take 325 mg by mouth daily.    . budesonide (RHINOCORT AQUA) 32 MCG/ACT nasal spray Place 2 sprays into both nostrils daily. 8.6 mL 2  . carvedilol (COREG) 6.25 MG tablet TAKE ONE TABLET BY MOUTH TWICE DAILY WITH  A  MEAL 60 tablet 2  . Cholecalciferol (VITAMIN D3) 2000 UNITS capsule Take 2,000 Units by mouth daily.    . clobetasol cream (TEMOVATE) [id number] % Apply 1 application topically daily as needed (for skinning of skin). Reported on 08/28/2015    . clotrimazole-betamethasone (LOTRISONE) cream Apply 1 application topically 2 (two) times daily. 30 g 11  . Docosahexaenoic Acid (DHA ALGAL-900 PO) Take 1 tablet by mouth 2 (two) times daily.    Marland Kitchen glimepiride (AMARYL) 2 MG tablet TAKE ONE TABLET BY MOUTH ONCE DAILY BEFORE BREAKFAST 30 tablet 0  . glucose blood test strip Test BS 1 time a day 100 each 12  . guaiFENesin (MUCINEX) 600 MG 12 hr tablet Take 600 mg by mouth 2 (two)  times daily as needed for cough or to loosen phlegm.     Marland Kitchen levothyroxine (SYNTHROID, LEVOTHROID) 150 MCG tablet TAKE ONE TABLET BY MOUTH ONCE DAILY (Patient taking differently: TAKE ONE TABLET BY MOUTH ONCE DAILY on Fridays) 30 tablet 5  . levothyroxine (SYNTHROID, LEVOTHROID) 175 MCG tablet TAKE ONE TABLET BY MOUTH ONCE DAILY (Patient taking differently: TAKE ONE TABLET BY MOUTH ONCE DAILY Sunday - Thursday (take 149mcg on Friday)) 90 tablet 3  . losartan-hydrochlorothiazide (HYZAAR) 100-25 MG tablet TAKE ONE TABLET BY MOUTH ONCE DAILY 90 tablet 0  . Omega-3 Fatty Acids (FISH OIL TRIPLE STRENGTH) 1400 MG CAPS Take 1,400 mg by mouth daily.     Marland Kitchen omeprazole (  PRILOSEC) 20 MG capsule Take 1 capsule (20 mg total) by mouth daily. (Patient taking differently: Take 20 mg by mouth daily. Patient takes several a month) 90 capsule 3  . ONE TOUCH LANCETS MISC Patient to check BS once daily 200 each 0    Allergies as of 08/29/2015 - Review Complete 08/29/2015  Allergen Reaction Noted  . Codeine Itching 06/22/2010  . Other Itching 04/08/2012  . Doxycycline Rash 06/01/2007    Family History  Problem Relation Age of Onset  . Heart failure Mother   . Cancer Father   . Cancer Brother      Social History   Social History  . Marital Status: Married    Spouse Name: N/A  . Number of Children: N/A  . Years of Education: N/A   Occupational History  . Not on file.   Social History Main Topics  . Smoking status: Never Smoker   . Smokeless tobacco: Never Used  . Alcohol Use: No  . Drug Use: No  . Sexual Activity: Not on file   Other Topics Concern  . Not on file   Social History Narrative    Review of Systems: PER HPI OTHERWISE ALL SYSTEMS ARE NEGATIVE.   Vitals: Blood pressure 111/60, pulse 73, temperature 98.1 F (36.7 C), temperature source Oral, resp. rate 16, height 5\' 4"  (1.626 m), weight 245 lb (111.131 kg), SpO2 99 %.  Physical Exam: General:   Alert,  Well-developed,  well-nourished, pleasant and cooperative in NAD Head:  Normocephalic and atraumatic. Eyes:  Sclera clear, no icterus.   Conjunctiva pink. Mouth:  No lesions, dentition normal. Neck:  Supple; no masses. Lungs:  Clear throughout to auscultation.   No wheezes. No acute distress. Heart:  Regular rate and rhythm; no murmurs, clicks, rubs,  or gallops. Abdomen:  Soft, nontender and nondistended. No masses noted. Normal bowel sounds, without guarding, and without rebound.   Msk:  Symmetrical without gross deformities. Normal posture. Extremities:  Without edema. Neurologic:  Alert and  oriented x4;  grossly normal neurologically. Cervical Nodes:  No significant cervical adenopathy. Psych:  Alert and cooperative. Normal mood and affect.  Lab Results:  Recent Labs  08/28/15 1449 08/29/15 1142  WBC 7.5 6.7  HGB  --  9.0*  HCT 25.7* 26.2*  PLT 317 317   BMET  Recent Labs  08/29/15 1142  NA 142  K 3.7  CL 108  CO2 25  GLUCOSE 120*  BUN 17  CREATININE 0.82  CALCIUM 9.0   LFT No results for input(s): PROT, ALBUMIN, AST, ALT, ALKPHOS, BILITOT, BILIDIR, IBILI in the last 72 hours.   Studies/Results: NONE     Sandi Fields  08/29/2015, 1:14 PM

## 2015-08-29 NOTE — ED Notes (Signed)
MD at bedside. 

## 2015-08-29 NOTE — Discharge Instructions (Signed)
YOUR upper ABDOMINAL PAIN AND BLACK STOOLS ARE DUE TO ULCERS FROM ASPIRIN. YOU HAVE ulcers in your stomach AMD SMALL BOWEL. I BIOPSIED YOUR STOMACH.   STRICTLY AVOID ASPIRIN, BC/GOODY POWDERS, IBUPROFEN/MOTRIN, OR NAPROXEN/ALEVE FOR 7 DAYS BECAUSE YOU HAVE A ULCERS IN YOUR STOMACH.  USE TYLENOL AS NEEDED FOR PAIN.   CONTINUE OMEPRAZOLE.  INCREASE TO TWICE DAILY FOR 3 MOS THE ONCE DAILY FOR AS LONG AS YOU TAKE ASPIRIN.  FOLLOW A LOW FAT DIET. SEE INFO BELOW.  REPEAT UPPER ENDOSCOPY IN 3 MOS.   FOLLOW UP IN 4 MOS.     UPPER ENDOSCOPY AFTER CARE Read the instructions outlined below and refer to this sheet in the next week. These discharge instructions provide you with general information on caring for yourself after you leave the hospital. While your treatment has been planned according to the most current medical practices available, unavoidable complications occasionally occur. If you have any problems or questions after discharge, call DR. Najib Colmenares, [phone].  ACTIVITY  You may resume your regular activity, but move at a slower pace for the next 24 hours.   Take frequent rest periods for the next 24 hours.   Walking will help get rid of the air and reduce the bloated feeling in your belly (abdomen).   No driving for 24 hours (because of the medicine (anesthesia) used during the test).   You may shower.   Do not sign any important legal documents or operate any machinery for 24 hours (because of the anesthesia used during the test).    NUTRITION  Drink plenty of fluids.   You may resume your normal diet as instructed by your doctor.   Begin with a light meal and progress to your normal diet. Heavy or fried foods are harder to digest and may make you feel sick to your stomach (nauseated).   Avoid alcoholic beverages for 24 hours or as instructed.    MEDICATIONS  You may resume your normal medications.   WHAT YOU CAN EXPECT TODAY  Some feelings of bloating in the  abdomen.   Passage of more gas than usual.    IF YOU HAD A BIOPSY TAKEN DURING THE UPPER ENDOSCOPY:    Eat a soft diet IF YOU HAVE NAUSEA, BLOATING, ABDOMINAL PAIN, OR VOMITING.    FINDING OUT THE RESULTS OF YOUR TEST Not all test results are available during your visit. DR. Oneida Alar WILL CALL YOU WITHIN 14 DAYS OF YOUR PROCEDUE WITH YOUR RESULTS. Do not assume everything is normal if you have not heard from DR. Koby Hartfield, CALL HER OFFICE AT [phone].  SEEK IMMEDIATE MEDICAL ATTENTION AND CALL THE OFFICE: [phone] IF:  You have more than a spotting of blood in your stool.   Your belly is swollen (abdominal distention).   You are nauseated or vomiting.   You have a temperature over 101F.   You have abdominal pain or discomfort that is severe or gets worse throughout the day.   Ulcer Disease (Peptic Ulcer, Gastric Ulcer, Duodenal Ulcer) You have an ulcer. This may be in your stomach (gastric ulcer) or in the first part of your small bowel, the duodenum (duodenal ulcer). An ulcer is a break in the lining of the stomach or duodenum. The ulcer causes erosion into the deeper tissue.  CAUSES The stomach has a lining to protect itself from the acid that digests food. The lining can be damaged in two main ways:  The Helico Pylori bacteria (H. Pyolori) can infect the lining of  the stomach and cause ulcers.   Nonsteroidal, anti-inflammatory medications (NSAIDS) can cause gastric ulcerations.   Smoking tobacco can increase the acid in the stomach. This can lead to ulcers, and will impair healing of ulcers.    SYMPTOMS The problems (symptoms) of ulcer disease are usually a burning or gnawing of the mid-upper belly (abdomen). This is often worse on an empty stomach and may get better with food. This may be associated with feeling sick to your stomach (nausea), bloating, and vomiting.  HOME CARE INSTRUCTIONS   Avoid tobacco, alcohol, and caffeine. Tobacco use will decrease and  slow the rates of healing.   Avoid foods that seem to aggravate or cause discomfort.    Low-Fat Diet BREADS, CEREALS, PASTA, RICE, DRIED PEAS, AND BEANS These products are high in carbohydrates and most are low in fat. Therefore, they can be increased in the diet as substitutes for fatty foods. They too, however, contain calories and should not be eaten in excess. Cereals can be eaten for snacks as well as for breakfast.   FRUITS AND VEGETABLES It is good to eat fruits and vegetables. Besides being sources of fiber, both are rich in vitamins and some minerals. They help you get the daily allowances of these nutrients. Fruits and vegetables can be used for snacks and desserts.  MEATS Limit lean meat, chicken, [place], and fish to no more than 6 ounces per day. Beef, Pork, and Lamb Use lean cuts of beef, pork, and lamb. Lean cuts include:  Extra-lean ground beef.  Arm roast.  Sirloin tip.  Center-cut ham.  Round steak.  Loin chops.  Rump roast.  Tenderloin.  Trim all fat off the outside of meats before cooking. It is not necessary to severely decrease the intake of red meat, but lean choices should be made. Lean meat is rich in protein and contains a highly absorbable form of iron. Premenopausal women, in particular, should avoid reducing lean red meat because this could increase the risk for low red blood cells (iron-deficiency anemia).  Chicken and [place] These are good sources of protein. The fat of poultry can be reduced by removing the skin and underlying fat layers before cooking. Chicken and [place] can be substituted for lean red meat in the diet. Poultry should not be fried or covered with high-fat sauces. Fish and Shellfish Fish is a good source of protein. Shellfish contain cholesterol, but they usually are low in saturated fatty acids. The preparation of fish is important. Like chicken and [place], they should not be fried or covered with high-fat sauces. EGGS Egg whites contain  no fat or cholesterol. They can be eaten often. Try 1 to 2 egg whites instead of whole eggs in recipes or use egg substitutes that do not contain yolk. MILK AND DAIRY PRODUCTS Use skim or 1% milk instead of 2% or whole milk. Decrease whole milk, natural, and processed cheeses. Use nonfat or low-fat (2%) cottage cheese or low-fat cheeses made from vegetable oils. Choose nonfat or low-fat (1 to 2%) yogurt. Experiment with evaporated skim milk in recipes that call for heavy cream. Substitute low-fat yogurt or low-fat cottage cheese for sour cream in dips and salad dressings. Have at least 2 servings of low-fat dairy products, such as 2 glasses of skim (or 1%) milk each day to help get your daily calcium intake. FATS AND OILS Reduce the total intake of fats, especially saturated fat. Butterfat, lard, and beef fats are high in saturated fat and cholesterol. These should be  avoided as much as possible. Vegetable fats do not contain cholesterol, but certain vegetable fats, such as coconut oil, palm oil, and palm kernel oil are very high in saturated fats. These should be limited. These fats are often used in bakery goods, processed foods, popcorn, oils, and nondairy creamers. Vegetable shortenings and some peanut butters contain hydrogenated oils, which are also saturated fats. Read the labels on these foods and check for saturated vegetable oils. Unsaturated vegetable oils and fats do not raise blood cholesterol. However, they should be limited because they are fats and are high in calories. Total fat should still be limited to 30% of your daily caloric intake. Desirable liquid vegetable oils are corn oil, cottonseed oil, olive oil, canola oil, safflower oil, soybean oil, and sunflower oil. Peanut oil is not as good, but small amounts are acceptable. Buy a heart-healthy tub margarine that has no partially hydrogenated oils in the ingredients. Mayonnaise and salad dressings often are made from unsaturated fats, but  they should also be limited because of their high calorie and fat content. Seeds, nuts, peanut butter, olives, and avocados are high in fat, but the fat is mainly the unsaturated type. These foods should be limited mainly to avoid excess calories and fat. OTHER EATING TIPS Snacks  Most sweets should be limited as snacks. They tend to be rich in calories and fats, and their caloric content outweighs their nutritional value. Some good choices in snacks are graham crackers, melba toast, soda crackers, bagels (no egg), English muffins, fruits, and vegetables. These snacks are preferable to snack crackers, [place] fries, TORTILLA CHIPS, and POTATO chips. Popcorn should be air-popped or cooked in small amounts of liquid vegetable oil. Desserts Eat fruit, low-fat yogurt, and fruit ices instead of pastries, cake, and cookies. Sherbet, angel food cake, gelatin dessert, frozen low-fat yogurt, or other frozen products that do not contain saturated fat (pure fruit juice bars, frozen ice pops) are also acceptable.  COOKING METHODS Choose those methods that use little or no fat. They include: Poaching.  Braising.  Steaming.  Grilling.  Baking.  Stir-frying.  Broiling.  Microwaving.  Foods can be cooked in a nonstick pan without added fat, or use a nonfat cooking spray in regular cookware. Limit fried foods and avoid frying in saturated fat. Add moisture to lean meats by using water, broth, cooking wines, and other nonfat or low-fat sauces along with the cooking methods mentioned above. Soups and stews should be chilled after cooking. The fat that forms on top after a few hours in the refrigerator should be skimmed off. When preparing meals, avoid using excess salt. Salt can contribute to raising blood pressure in some people.  EATING AWAY FROM HOME Order entres, potatoes, and vegetables without sauces or butter. When meat exceeds the size of a deck of cards (3 to 4 ounces), the rest can be taken home for  another meal. Choose vegetable or fruit salads and ask for low-calorie salad dressings to be served on the side. Use dressings sparingly. Limit high-fat toppings, such as bacon, crumbled eggs, cheese, sunflower seeds, and olives. Ask for heart-healthy tub margarine instead of butter.

## 2015-08-29 NOTE — ED Provider Notes (Signed)
CSN: [id number]     Arrival date & time 08/29/15  1104 History  By signing my name below, I, Karen Potter, attest that this documentation has been prepared under the direction and in the presence of Karen Fraise, MD. Electronically Signed: Terressa Potter, ED Scribe. 08/29/2015. 11:48 AM  Chief Complaint  Patient presents with  . GI Bleeding   The history is provided by the patient. No language interpreter was used.   PCP: Wardell Honour, MD HPI Comments: Karen Potter is a 75 y.o. female, with PMHx noted below, who presents to the Emergency Department complaining of acute dark/black onset one week ago. Associated Sx include generalized weakness and nausea. Pt notes she had a BM this morning that was mostly brown stool with some black stool mixed in. Pt reports she was seen for the same by [facility] yesterday whereby labs were completed that showed hemoglobin of 8.8; pt denies a guaiac test was completed during her visit. Pt denies vomiting, abd pain, chest pain, back pain, cough (mild cough at baseline). Pt denies chronic blood thinner use. Pt denies excessive ibuprofen or aspirin use (notes she takes a daily 325mg  tablet of aspirin at baseline). Pt denies taking iron supplements.   Past Medical History  Diagnosis Date  . Amnesia   . Polio   . Migraine   . Hyperglycemia   . Hyperlipidemia   . Hypertension   . Hypothyroid   . Seasonal allergies   . Vitamin D deficiency   . Obesity   . Fatty liver   . Diabetes mellitus without complication ([place])   . Sleep apnea     uses c-pap machine since 2003  . TIA (transient ischemic attack)   . Shortness of breath dyspnea   . Chronic kidney disease     kidney stones  . GERD (gastroesophageal reflux disease)   . 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 Hepatitis     hx of Hep B   Past Surgical History  Procedure Laterality Date  . Abdominal hysterectomy    . Cholecystectomy    . Carpal tunnel release     . Back surgery    . Knee surgery      right  . Bladder surgery    . Ovarian cyst;  polio (3 surgeries)    . Parathyroidectomy N/A 05/06/2015    Procedure: PARATHYROIDECTOMY;  Surgeon: Armandina Gemma, MD;  Location: [facility];  Service: General;  Laterality: N/A;   Family History  Problem Relation Age of Onset  . Heart failure Mother   . Cancer Father   . Cancer Brother    Social History  Substance Use Topics  . Smoking status: Never Smoker   . Smokeless tobacco: Never Used  . Alcohol Use: No   OB History    No data available     Review of Systems  Respiratory: Negative for cough (intermittent cough at baseline).   Cardiovascular: Negative for chest pain.  Gastrointestinal: Positive for nausea and blood in stool. Negative for vomiting and abdominal pain.  Musculoskeletal: Negative for back pain.  Neurological: Positive for weakness.  Hematological: Does not bruise/bleed easily.  All other systems reviewed and are negative.     Allergies  Codeine; Other; and Doxycycline  Home Medications   Prior to Admission medications   Medication Sig Start Date End Date Taking? Authorizing Provider  albuterol (PROVENTIL HFA;VENTOLIN HFA) 108 (90 Base) MCG/ACT inhaler Inhale 2 puffs into the lungs  every 6 (six) hours as needed for wheezing or shortness of breath. 05/29/15   Fransisca Kaufmann Dettinger, MD  aspirin EC 325 MG tablet Take 325 mg by mouth daily.    Historical Provider, MD  budesonide (RHINOCORT AQUA) 32 MCG/ACT nasal spray Place 2 sprays into both nostrils daily. 05/23/15   Fransisca Kaufmann Dettinger, MD  Calcium Citrate-Vitamin D (CALCIUM + D PO) Take 1-2 tablets by mouth 2 (two) times daily. Take 2 tablets in the morning and 1 tablet at night    Historical Provider, MD  carvedilol (COREG) 6.25 MG tablet TAKE ONE TABLET BY MOUTH TWICE DAILY WITH  A  MEAL 05/26/15   Wardell Honour, MD  Cholecalciferol (VITAMIN D3) 2000 UNITS capsule Take 2,000 Units by mouth daily.    Historical Provider, MD   clobetasol cream (TEMOVATE) [id number] % Apply 1 application topically daily as needed (for skinning of skin). Reported on 08/28/2015    Historical Provider, MD  clotrimazole-betamethasone (LOTRISONE) cream Apply 1 application topically 2 (two) times daily. 11/05/13   Sharion Balloon, FNP  Docosahexaenoic Acid (DHA ALGAL-900 PO) Take 1 tablet by mouth 2 (two) times daily.    Historical Provider, MD  glimepiride (AMARYL) 2 MG tablet TAKE ONE TABLET BY MOUTH ONCE DAILY BEFORE BREAKFAST 08/18/15   Wardell Honour, MD  glucose blood test strip Test BS 1 time a day 12/31/14   Wardell Honour, MD  guaiFENesin (MUCINEX) 600 MG 12 hr tablet Take 600 mg by mouth 2 (two) times daily as needed for cough or to loosen phlegm.     Historical Provider, MD  levothyroxine (SYNTHROID, LEVOTHROID) 150 MCG tablet TAKE ONE TABLET BY MOUTH ONCE DAILY Patient taking differently: TAKE ONE TABLET BY MOUTH ONCE DAILY on Fridays 05/08/14   Wardell Honour, MD  levothyroxine (SYNTHROID, LEVOTHROID) 175 MCG tablet TAKE ONE TABLET BY MOUTH ONCE DAILY Patient taking differently: TAKE ONE TABLET BY MOUTH ONCE DAILY Sunday - Thursday (take 167mcg on Friday) 02/10/15   Wardell Honour, MD  losartan-hydrochlorothiazide (HYZAAR) 100-25 MG tablet TAKE ONE TABLET BY MOUTH ONCE DAILY 08/18/15   Wardell Honour, MD  Omega-3 Fatty Acids (FISH OIL TRIPLE STRENGTH) 1400 MG CAPS Take 1,400 mg by mouth daily.     Historical Provider, MD  omeprazole (PRILOSEC) 20 MG capsule Take 1 capsule (20 mg total) by mouth daily. Patient taking differently: Take 20 mg by mouth daily. Patient takes several a month 06/12/15   Wardell Honour, MD  ONE TOUCH LANCETS MISC Patient to check BS once daily 12/31/14   Wardell Honour, MD   Triage Vitals: BP 111/60 mmHg  Pulse 73  Temp(Src) 98.1 F (36.7 C) (Oral)  Resp 16  Ht 5\' 4"  (1.626 m)  Wt 245 lb (111.131 kg)  BMI 42.03 kg/m2  SpO2 99% Physical Exam CONSTITUTIONAL: Well developed/well nourished HEAD:  Normocephalic/atraumatic EYES: EOMI/PERRL ENMT: Mucous membranes moist NECK: supple no meningeal signs SPINE/BACK:entire spine nontender CV: S1/S2 noted, no murmurs/rubs/gallops noted LUNGS: Lungs are clear to auscultation bilaterally, no apparent distress ABDOMEN: soft, nontender, no rebound or guarding, bowel sounds noted throughout abdomen GU: rectal stool color brown, no melena or blood, hemoccult negative, nurse chaperone present  NEURO: Pt is awake/alert/appropriate, moves all extremitiesx4.  No facial droop.   EXTREMITIES: pulses normal/equal, full ROM SKIN: warm, color normal PSYCH: no abnormalities of mood noted, alert and oriented to situation  ED Course  Procedures  DIAGNOSTIC STUDIES: Oxygen Saturation is 99% on ra, nl by my  interpretation.    COORDINATION OF CARE: 11:36 AM: Discussed treatment plan which includes rectal exam, labs with pt at bedside; patient verbalizes understanding and agrees with treatment plan.  Labs Review Labs Reviewed  BASIC METABOLIC PANEL - Abnormal; Notable for the following:    Glucose, Bld 120 (*)    All other components within normal limits  CBC WITH DIFFERENTIAL/PLATELET - Abnormal; Notable for the following:    RBC 2.91 (*)    Hemoglobin 9.0 (*)    HCT 26.2 (*)    All other components within normal limits  POC OCCULT BLOOD, ED  SAMPLE TO BLOOD BANK  TYPE AND SCREEN    I have personally reviewed and evaluated these lab results as part of my medical decision-making.  1:47 PM Pt stable No acute drop in HGB Seen by Dr Oneida Alar with GI She will take patient to endoscopy  MDM   Final diagnoses:  Gastrointestinal hemorrhage, unspecified gastritis, unspecified gastrointestinal hemorrhage type    Nursing notes including past medical history and social history reviewed and considered in documentation Labs/vital reviewed myself and considered during evaluation    I personally performed the services described in this documentation,  which was scribed in my presence. The recorded information has been reviewed and is accurate.       Karen Fraise, MD 08/29/15 [phone]

## 2015-08-29 NOTE — Op Note (Signed)
[facility] Patient Name: Karen Potter Procedure Date: 08/29/2015 2:19 PM MRN: [id number] Date of Birth: [birth year] Attending MD: Barney Drain , MD CSN: [id number] Age: 75 Admit Type: Outpatient Procedure:                Upper GI endoscopy Indications:              Melena-taking asa without a ppt becasue she heard                            it caused harm. Providers:                Barney Drain, MD, Janeece Riggers, RN, Isabella Stalling,                            Technician Referring MD:              Medicines:                Diphenhydramine 12.5 mg IV, Meperidine 50 mg IV,                            Midazolam 4 mg IV Complications:            No immediate complications. Estimated Blood Loss:     Estimated blood loss was minimal. Procedure:                Pre-Anesthesia Assessment:                           - Prior to the procedure, a History and Physical                            was performed, and patient medications and                            allergies were reviewed. The patient's tolerance of                            previous anesthesia was also reviewed. The risks                            and benefits of the procedure and the sedation                            options and risks were discussed with the patient.                            All questions were answered, and informed consent                            was obtained. Prior Anticoagulants: The patient has                            taken aspirin, last dose was day of procedure. ASA  Grade Assessment: II - A patient with mild systemic                            disease. After reviewing the risks and benefits,                            the patient was deemed in satisfactory condition to                            undergo the procedure.                           After obtaining informed consent, the endoscope was                            passed under direct vision. Throughout the                          procedure, the patient's blood pressure, pulse, and                            oxygen saturations were monitored continuously. The                            [id number]) scope was introduced through the                            mouth, and advanced to the second part of duodenum.                            The upper GI endoscopy was accomplished with ease.                            The patient tolerated the procedure well. Scope In: 2:55:10 PM Scope Out: 3:05:04 PM Total Procedure Duration: 0 hours 9 minutes 54 seconds  Findings:      The examined esophagus was normal.      Many non-bleeding cratered gastric ulcers with no stigmata of bleeding       were found in the prepyloric region of the stomach. Biopsies were taken       with a cold forceps for Helicobacter pylori testing.      One non-bleeding cratered duodenal ulcer with a clean ulcer base       (Forrest Class III) was found in the first portion of the duodenum. The       lesion was 6 mm in largest dimension.      GI BLEED DUE TO GASTRIC AND DUODENAL ULCER(S) Impression:               - Normal esophagus.                           - Non-bleeding gastric ulcers with no stigmata of                            bleeding. Biopsied.                           -  One non-bleeding duodenal ulcer with a clean                            ulcer base (Forrest Class III). Moderate Sedation:      Moderate (conscious) sedation was administered by the endoscopy nurse       and supervised by the endoscopist. The following parameters were       monitored: oxygen saturation, heart rate, blood pressure, and response       to care. Total physician intraservice time was 22 minutes. Recommendation:           - Patient has a contact number available for                            emergencies. The signs and symptoms of potential                            delayed complications were discussed with the                             patient. Return to normal activities tomorrow.                            Written discharge instructions were provided to the                            patient.                           - Low fat diet.                           - No aspirin, ibuprofen, naproxen, or other                            non-steroidal anti-inflammatory drugs for 7 days                            after biopsy.                           - Use Prilosec (omeprazole) 20 mg PO BID.                           - Await pathology results.                           - Repeat upper endoscopy in 3 months for                            surveillance of ulcers.                           - Return to GI office in 4 months. Procedure Code(s):        --- Professional ---  [id number], Esophagogastroduodenoscopy, flexible,                            transoral; with biopsy, single or multiple                           G0500, Moderate sedation services provided by the                            same physician or other qualified health care                            professional performing a gastrointestinal                            endoscopic service that sedation supports,                            requiring the presence of an independent trained                            observer to assist in the monitoring of the                            patient's level of consciousness and physiological                            status; initial 15 minutes of intra-service time;                            patient age 90 years or older (additional time may                            be reported with [phone], as appropriate) Diagnosis Code(s):        --- Professional ---                           K25.9, Gastric ulcer, unspecified as acute or                            chronic, without hemorrhage or perforation                           K26.9, Duodenal ulcer, unspecified as acute or                            chronic, without  hemorrhage or perforation                           K92.1, Melena (includes Hematochezia) CPT copyright 2016 American [employer] Association. All rights reserved. The codes documented in this report are preliminary and upon coder review may  be revised to meet current compliance requirements. Barney Drain, MD Barney Drain, MD 08/29/2015 3:22:13 PM This report has been signed electronically. Number of Addenda: 0

## 2015-09-01 LAB — CBG MONITORING, ED: Glucose-Capillary: 82 mg/dL (ref 65–99)

## 2015-09-01 LAB — TYPE AND SCREEN
ABO/RH(D): O POS
Antibody Screen: NEGATIVE

## 2015-09-09 ENCOUNTER — Other Ambulatory Visit: Payer: Self-pay | Admitting: Family Medicine

## 2015-09-12 ENCOUNTER — Encounter (HOSPITAL_COMMUNITY): Payer: Self-pay | Admitting: Gastroenterology

## 2015-10-15 ENCOUNTER — Encounter: Payer: Self-pay | Admitting: Gastroenterology

## 2015-10-16 ENCOUNTER — Encounter: Payer: Self-pay | Admitting: Family Medicine

## 2015-10-16 ENCOUNTER — Ambulatory Visit (INDEPENDENT_AMBULATORY_CARE_PROVIDER_SITE_OTHER): Payer: Medicare Other | Admitting: Family Medicine

## 2015-10-16 VITALS — BP 120/78 | HR 71 | Temp 97.8°F | Ht 64.5 in | Wt 248.6 lb

## 2015-10-16 DIAGNOSIS — K264 Chronic or unspecified duodenal ulcer with hemorrhage: Secondary | ICD-10-CM | POA: Diagnosis not present

## 2015-10-16 DIAGNOSIS — I10 Essential (primary) hypertension: Secondary | ICD-10-CM | POA: Diagnosis not present

## 2015-10-16 DIAGNOSIS — E785 Hyperlipidemia, unspecified: Secondary | ICD-10-CM

## 2015-10-16 DIAGNOSIS — E119 Type 2 diabetes mellitus without complications: Secondary | ICD-10-CM | POA: Diagnosis not present

## 2015-10-16 DIAGNOSIS — R0602 Shortness of breath: Secondary | ICD-10-CM

## 2015-10-16 DIAGNOSIS — N200 Calculus of kidney: Secondary | ICD-10-CM | POA: Diagnosis not present

## 2015-10-16 LAB — BAYER DCA HB A1C WAIVED: HB A1C (BAYER DCA - WAIVED): 6.2 % (ref <7.0)

## 2015-10-16 MED ORDER — GLIMEPIRIDE 2 MG PO TABS: ORAL_TABLET | ORAL | Status: DC

## 2015-10-16 MED ORDER — LEVOTHYROXINE SODIUM 175 MCG PO TABS: ORAL_TABLET | ORAL | Status: DC

## 2015-10-16 MED ORDER — LOSARTAN POTASSIUM-HCTZ 100-25 MG PO TABS: 1.0000 | ORAL_TABLET | Freq: Every day | ORAL | Status: DC

## 2015-10-16 NOTE — Progress Notes (Signed)
Subjective:    Patient ID: Karen Potter, female    DOB: [birth year], 75 y.o.   MRN: [id number]  HPI 75 year old female here to follow-up diabetes hypercalcemia lipids. Her chief complaint today is malaise and fatigue. She is unable to do a lot of what she would like to do. She is in the process of trying to get some tomatoes planted but energy or lack of prohibits her from doing that. It is hard for her to declare if it is more a breathing problem or weakness problem. She has no history of COPD. Diabetes has been pretty well controlled with last A1c of 6.1. Today A1c is 6.2. Blood pressures are well managed with losartan and carvedilol. We could possibly reduce the dose of carvedilol as regards her energy levels.  Patient Active Problem List   Diagnosis Date Noted  . Bleeding gastrointestinal   . Primary hyperparathyroidism ([place]) 05/06/2015  . Hyperparathyroidism, primary ([place]) 05/05/2015  . Diabetes mellitus without complication ([place])   . Chest pain 04/02/2014  . GERD (gastroesophageal reflux disease) 11/05/2013  . Fatty liver 06/01/2013  . Hypercalcemia 05/14/2013  . Abnormal transaminases 05/14/2013  . Hot flashes 05/14/2013  . Hyperglycemia   . Hyperlipidemia   . Hypertension   . Hypothyroid   . Migraine   . Polio   . Amnesia   . Seasonal allergies   . Vitamin D deficiency   . Obesity   . Aphasia 04/11/2012  . TIA (transient ischemic attack) 04/11/2012  . 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 OBSTRUCTIVE SLEEP APNEA 05/31/2008  . Essential hypertension 05/31/2008  . OVARIAN CYST 05/31/2008  . CHOLECYSTECTOMY, HX OF 05/31/2008  . DYSPNEA 06/01/2007  . CHEST PAIN 06/01/2007   Outpatient Encounter Prescriptions as of 10/16/2015  Medication Sig  . aspirin 325 MG tablet Take 325 mg by mouth daily.  . budesonide (RHINOCORT AQUA) 32 MCG/ACT nasal spray Place 2 sprays into both nostrils daily.  . carvedilol (COREG) 6.25 MG tablet TAKE ONE TABLET BY MOUTH TWICE DAILY WITH  A  MEAL  .  Cholecalciferol (VITAMIN D3) 2000 UNITS capsule Take 2,000 Units by mouth daily.  . clobetasol cream (TEMOVATE) 2.59 % Apply 1 application topically daily as needed (for skinning of skin). Reported on 08/28/2015  . clotrimazole-betamethasone (LOTRISONE) cream Apply 1 application topically 2 (two) times daily.  . Docosahexaenoic Acid (DHA ALGAL-900 PO) Take 1 tablet by mouth 2 (two) times daily.  Marland Kitchen glimepiride (AMARYL) 2 MG tablet TAKE ONE TABLET BY MOUTH ONCE DAILY BEFORE BREAKFAST  . glucose blood test strip Test BS 1 time a day  . levothyroxine (SYNTHROID, LEVOTHROID) 150 MCG tablet TAKE ONE TABLET BY MOUTH ONCE DAILY  . levothyroxine (SYNTHROID, LEVOTHROID) 175 MCG tablet TAKE ONE TABLET BY MOUTH ONCE DAILY (Patient taking differently: TAKE ONE TABLET BY MOUTH ONCE DAILY Sunday - Thursday (take 190mg on Friday))  . losartan-hydrochlorothiazide (HYZAAR) 100-25 MG tablet TAKE ONE TABLET BY MOUTH ONCE DAILY  . Omega-3 Fatty Acids (FISH OIL TRIPLE STRENGTH) 1400 MG CAPS Take 1,400 mg by mouth daily.   .Marland Kitchenomeprazole (PRILOSEC) 20 MG capsule Take 1 capsule (20 mg total) by mouth 2 (two) times daily before a meal.  . ONE TOUCH LANCETS MISC Patient to check BS once daily  . albuterol (PROVENTIL HFA;VENTOLIN HFA) 108 (90 Base) MCG/ACT inhaler Inhale 2 puffs into the lungs every 6 (six) hours as needed for wheezing or shortness of breath. (Patient not taking: Reported on 10/16/2015)  . guaiFENesin (MUCINEX) 600 MG 12 hr tablet  Take 600 mg by mouth 2 (two) times daily as needed for cough or to loosen phlegm. Reported on 10/16/2015   No facility-administered encounter medications on file as of 10/16/2015.      Review of Systems  Constitutional: Positive for activity change and fatigue.  Respiratory: Positive for shortness of breath.   Cardiovascular: Negative.   Neurological: Positive for weakness.  Psychiatric/Behavioral: Negative.        Objective:   Physical Exam  Constitutional: She is oriented to  person, place, and time. She appears well-developed and well-nourished.  Cardiovascular: Normal rate, regular rhythm and normal heart sounds.  Exam reveals no gallop.   Pulmonary/Chest: Effort normal and breath sounds normal.  Pulmonary function studies show FEV1 of 75% consistent with COPD  Neurological: She is alert and oriented to person, place, and time.   BP 120/78 mmHg  Pulse 71  Temp(Src) 97.8 F (36.6 C) (Oral)  Ht 5' 4.5" (1.638 m)  Wt 248 lb 9.6 oz (112.764 kg)  BMI 42.03 kg/m2        Assessment & Plan:  1. Diabetes mellitus without complication ([place]) C continues to show good control. Continue with glimepiride as before - Bayer DCA Hb A1c Waived - Microalbumin / creatinine urine ratio  2. Essential hypertension Blood pressures are good. Consider reducing the dose of carvedilol - CMP14+EGFR  3. Hyperlipidemia Last LDL nine months ago showed 53 with total cholesterol of 161 - Lipid panel  4. SOB (shortness of breath) She may have some early COPD based on FEV1 today. This would seem to correspond to a GOL DOB 2  Will empirically try inhaler. Given anoro sample - CBC with Differential/Platelet - PR BREATHING CAPACITY TEST  5. Gastrointestinal hemorrhage associated with duodenal ulcer Hemoglobin had gotten down to 9. Need to follow-up today as this may be part of her weakness and fatigability  Wardell Honour MD

## 2015-10-17 ENCOUNTER — Other Ambulatory Visit: Payer: Self-pay | Admitting: Urology

## 2015-10-17 LAB — CBC WITH DIFFERENTIAL/PLATELET
Basophils Absolute: 0 10*3/uL (ref 0.0–0.2)
Basos: 1 %
EOS (ABSOLUTE): 0.3 10*3/uL (ref 0.0–0.4)
Eos: 4 %
Hematocrit: 33.1 % — ABNORMAL LOW (ref 34.0–46.6)
Hemoglobin: 10.3 g/dL — ABNORMAL LOW (ref 11.1–15.9)
Immature Grans (Abs): 0 10*3/uL (ref 0.0–0.1)
Immature Granulocytes: 0 %
Lymphocytes Absolute: 3.1 10*3/uL (ref 0.7–3.1)
Lymphs: 44 %
MCH: 26.4 pg — ABNORMAL LOW (ref 26.6–33.0)
MCHC: 31.1 g/dL — ABNORMAL LOW (ref 31.5–35.7)
MCV: 85 fL (ref 79–97)
Monocytes Absolute: 1 10*3/uL — ABNORMAL HIGH (ref 0.1–0.9)
Monocytes: 14 %
Neutrophils Absolute: 2.6 10*3/uL (ref 1.4–7.0)
Neutrophils: 37 %
Platelets: 362 10*3/uL (ref 150–379)
RBC: 3.9 x10E6/uL (ref 3.77–5.28)
RDW: 15.6 % — ABNORMAL HIGH (ref 12.3–15.4)
WBC: 7 10*3/uL (ref 3.4–10.8)

## 2015-10-17 LAB — CMP14+EGFR
ALT: 30 IU/L (ref 0–32)
AST: 28 IU/L (ref 0–40)
Albumin/Globulin Ratio: 1.2 (ref 1.2–2.2)
Albumin: 4.1 g/dL (ref 3.5–4.8)
Alkaline Phosphatase: 82 IU/L (ref 39–117)
BUN/Creatinine Ratio: 21 (ref 12–28)
BUN: 18 mg/dL (ref 8–27)
Bilirubin Total: 0.3 mg/dL (ref 0.0–1.2)
CO2: 24 mmol/L (ref 18–29)
Calcium: 9.2 mg/dL (ref 8.7–10.3)
Chloride: 102 mmol/L (ref 96–106)
Creatinine, Ser: 0.87 mg/dL (ref 0.57–1.00)
GFR calc Af Amer: 76 mL/min/{1.73_m2} (ref >59)
GFR calc non Af Amer: 66 mL/min/{1.73_m2} (ref >59)
Globulin, Total: 3.3 g/dL (ref 1.5–4.5)
Glucose: 132 mg/dL — ABNORMAL HIGH (ref 65–99)
Potassium: 4 mmol/L (ref 3.5–5.2)
Sodium: 142 mmol/L (ref 134–144)
Total Protein: 7.4 g/dL (ref 6.0–8.5)

## 2015-10-17 LAB — LIPID PANEL
Chol/HDL Ratio: 4.2 ratio units (ref 0.0–4.4)
Cholesterol, Total: 129 mg/dL (ref 100–199)
HDL: 31 mg/dL — ABNORMAL LOW (ref >39)
LDL Calculated: 51 mg/dL (ref 0–99)
Triglycerides: 236 mg/dL — ABNORMAL HIGH (ref 0–149)
VLDL Cholesterol Cal: 47 mg/dL — ABNORMAL HIGH (ref 5–40)

## 2015-10-17 LAB — MICROALBUMIN / CREATININE URINE RATIO
Creatinine, Urine: 116.5 mg/dL
MICROALB/CREAT RATIO: 6.2 mg/g creat (ref 0.0–30.0)
Microalbumin, Urine: 7.2 ug/mL

## 2015-11-05 ENCOUNTER — Encounter: Payer: Self-pay | Admitting: Gastroenterology

## 2015-11-06 ENCOUNTER — Encounter (HOSPITAL_BASED_OUTPATIENT_CLINIC_OR_DEPARTMENT_OTHER): Payer: Self-pay | Admitting: *Deleted

## 2015-11-06 DIAGNOSIS — H35032 Hypertensive retinopathy, left eye: Secondary | ICD-10-CM | POA: Diagnosis not present

## 2015-11-06 DIAGNOSIS — E119 Type 2 diabetes mellitus without complications: Secondary | ICD-10-CM | POA: Diagnosis not present

## 2015-11-06 LAB — HM DIABETES EYE EXAM

## 2015-11-06 NOTE — Progress Notes (Signed)
NPO AFTER MN.  ARRIVE AT 0945.  NEEDS ISTAT 8 .   CURRENT EKG IN CHART AND EPIC.  WILL TAKE AM MEDS DOS W/ SIPS OF WATER WITH EXCEPTION NO HYZAAR/ AMARYL.

## 2015-11-12 ENCOUNTER — Telehealth: Payer: Self-pay | Admitting: General Practice

## 2015-11-12 NOTE — Telephone Encounter (Signed)
-----   Message from Cesar Chavez sent at 11/12/2015  2:48 PM EDT ----- PATIENT CALLED INQUIRING ABOUT HER RECORDS SHE SIGNED A RELEASE FOR.  THEY WERE TO GO TO ANOTHER PHYSICIAN AND IN THE SYSTEM I SEE THE RELEASE WHERE TAMARA GOT IT. THE PATIENT CALLED [facility] AND SHE   STATED THAT THEY DID NOT KNOW WHO THE PATIENT WAS AND DID NOT KNOW ANYTHING ABOUT OUR OFFICE.

## 2015-11-12 NOTE — Telephone Encounter (Signed)
I tried to call the patient, however I was unable to reach her. We need to see if the patient has an appt with Dr. Watt Climes if so we can fax the records to his office directly so we can ensure good continuity of care.  Beckie Salts please ask the patient

## 2015-11-13 NOTE — Telephone Encounter (Signed)
SPOKE TO PATIENT AND SHE STATED THAT SHE DOES NOT HAVE AN APPOINTMENT YET WITH DR. Watt Climes.  HIS OFFICE IS WAITING ON HER PAPERS FROM THIS OFFICE BEFORE SCHEDULING.

## 2015-11-13 NOTE — Telephone Encounter (Signed)
PRINTED THE OP NOTE AND THE PATIENT WILL PICK UP HERE AND TAKE WITH HER.

## 2015-11-17 ENCOUNTER — Ambulatory Visit (HOSPITAL_BASED_OUTPATIENT_CLINIC_OR_DEPARTMENT_OTHER): Payer: Medicare Other | Admitting: Anesthesiology

## 2015-11-17 ENCOUNTER — Encounter (HOSPITAL_BASED_OUTPATIENT_CLINIC_OR_DEPARTMENT_OTHER): Payer: Self-pay | Admitting: *Deleted

## 2015-11-17 ENCOUNTER — Ambulatory Visit (HOSPITAL_BASED_OUTPATIENT_CLINIC_OR_DEPARTMENT_OTHER)
Admission: RE | Admit: 2015-11-17 | Discharge: 2015-11-17 | Disposition: A | Discharge status: Home / Self Care | Payer: Medicare Other | Source: Ambulatory Visit | Attending: Urology | Admitting: Urology

## 2015-11-17 ENCOUNTER — Encounter (HOSPITAL_BASED_OUTPATIENT_CLINIC_OR_DEPARTMENT_OTHER)
Admission: RE | Disposition: A | Discharge status: Home / Self Care | Payer: Self-pay | Source: Ambulatory Visit | Attending: Urology

## 2015-11-17 DIAGNOSIS — B91 Sequelae of poliomyelitis: Secondary | ICD-10-CM | POA: Diagnosis not present

## 2015-11-17 DIAGNOSIS — K219 Gastro-esophageal reflux disease without esophagitis: Secondary | ICD-10-CM | POA: Diagnosis not present

## 2015-11-17 DIAGNOSIS — Z87442 Personal history of urinary calculi: Secondary | ICD-10-CM | POA: Diagnosis not present

## 2015-11-17 DIAGNOSIS — Z7984 Long term (current) use of oral hypoglycemic drugs: Secondary | ICD-10-CM | POA: Diagnosis not present

## 2015-11-17 DIAGNOSIS — N200 Calculus of kidney: Secondary | ICD-10-CM | POA: Insufficient documentation

## 2015-11-17 DIAGNOSIS — Z7982 Long term (current) use of aspirin: Secondary | ICD-10-CM | POA: Insufficient documentation

## 2015-11-17 DIAGNOSIS — Z8673 Personal history of transient ischemic attack (TIA), and cerebral infarction without residual deficits: Secondary | ICD-10-CM | POA: Insufficient documentation

## 2015-11-17 DIAGNOSIS — I10 Essential (primary) hypertension: Secondary | ICD-10-CM | POA: Insufficient documentation

## 2015-11-17 DIAGNOSIS — D509 Iron deficiency anemia, unspecified: Secondary | ICD-10-CM | POA: Insufficient documentation

## 2015-11-17 DIAGNOSIS — E119 Type 2 diabetes mellitus without complications: Secondary | ICD-10-CM | POA: Insufficient documentation

## 2015-11-17 DIAGNOSIS — E039 Hypothyroidism, unspecified: Secondary | ICD-10-CM | POA: Diagnosis not present

## 2015-11-17 DIAGNOSIS — G4733 Obstructive sleep apnea (adult) (pediatric): Secondary | ICD-10-CM | POA: Diagnosis not present

## 2015-11-17 DIAGNOSIS — J449 Chronic obstructive pulmonary disease, unspecified: Secondary | ICD-10-CM | POA: Diagnosis not present

## 2015-11-17 DIAGNOSIS — Z79899 Other long term (current) drug therapy: Secondary | ICD-10-CM | POA: Diagnosis not present

## 2015-11-17 HISTORY — DX: Obstructive sleep apnea (adult) (pediatric): G47.33

## 2015-11-17 HISTORY — DX: Type 2 diabetes mellitus without complications: E11.9

## 2015-11-17 HISTORY — DX: Left anterior fascicular block: I44.4

## 2015-11-17 HISTORY — DX: Sequelae of poliomyelitis: M62.81

## 2015-11-17 HISTORY — DX: Hypothyroidism, unspecified: E03.9

## 2015-11-17 HISTORY — DX: Unspecified hearing loss, unspecified ear: H91.90

## 2015-11-17 HISTORY — DX: Personal history of other endocrine, nutritional and metabolic disease: Z86.39

## 2015-11-17 HISTORY — DX: Personal history of transient ischemic attack (TIA), and cerebral infarction without residual deficits: Z86.73

## 2015-11-17 HISTORY — PX: CYSTOSCOPY/RETROGRADE/URETEROSCOPY/STONE EXTRACTION WITH BASKET: SHX5317

## 2015-11-17 HISTORY — DX: Personal history of other infectious and parasitic diseases: Z86.19

## 2015-11-17 HISTORY — DX: Iron deficiency anemia, unspecified: D50.9

## 2015-11-17 HISTORY — DX: Gastric ulcer, unspecified as acute or chronic, without hemorrhage or perforation: K25.9

## 2015-11-17 HISTORY — DX: Duodenal ulcer, unspecified as acute or chronic, without hemorrhage or perforation: K26.9

## 2015-11-17 HISTORY — PX: HOLMIUM LASER APPLICATION: SHX5852

## 2015-11-17 HISTORY — DX: Unspecified right bundle-branch block: I45.10

## 2015-11-17 HISTORY — DX: Obstructive sleep apnea (adult) (pediatric): Z99.89

## 2015-11-17 HISTORY — DX: Gastritis, unspecified, without bleeding: K29.70

## 2015-11-17 HISTORY — DX: Personal history of urinary calculi: Z87.442

## 2015-11-17 HISTORY — DX: Unspecified osteoarthritis, unspecified site: M19.90

## 2015-11-17 HISTORY — DX: Sequelae of poliomyelitis: B91

## 2015-11-17 HISTORY — DX: Personal history of other benign neoplasm: Z86.018

## 2015-11-17 LAB — POCT I-STAT, CHEM 8
BUN: 17 mg/dL (ref 6–20)
Calcium, Ion: 1.24 mmol/L — ABNORMAL HIGH (ref 1.12–1.23)
Chloride: 104 mmol/L (ref 101–111)
Creatinine, Ser: 0.8 mg/dL (ref 0.44–1.00)
Glucose, Bld: 114 mg/dL — ABNORMAL HIGH (ref 65–99)
HCT: 40 % (ref 36.0–46.0)
Hemoglobin: 13.6 g/dL (ref 12.0–15.0)
Potassium: 3.9 mmol/L (ref 3.5–5.1)
Sodium: 144 mmol/L (ref 135–145)
TCO2: 26 mmol/L (ref 0–100)

## 2015-11-17 LAB — GLUCOSE, CAPILLARY: Glucose-Capillary: 109 mg/dL — ABNORMAL HIGH (ref 65–99)

## 2015-11-17 SURGERY — CYSTOSCOPY, WITH CALCULUS REMOVAL USING BASKET
Anesthesia: General | Laterality: Right

## 2015-11-17 MED ORDER — OXYCODONE-ACETAMINOPHEN 5-325 MG PO TABS
ORAL_TABLET | ORAL | Status: AC
Start: 2015-11-17 — End: 2015-11-17
  Filled 2015-11-17: qty 1

## 2015-11-17 MED ORDER — DEXAMETHASONE SODIUM PHOSPHATE 10 MG/ML IJ SOLN
INTRAMUSCULAR | Status: DC | PRN
  Administered 2015-11-17: 4 mg via INTRAVENOUS

## 2015-11-17 MED ORDER — CEFAZOLIN SODIUM-DEXTROSE 2-4 GM/100ML-% IV SOLN
INTRAVENOUS | Status: AC
  Filled 2015-11-17: qty 100

## 2015-11-17 MED ORDER — FENTANYL CITRATE (PF) 100 MCG/2ML IJ SOLN
25.0000 ug | INTRAMUSCULAR | Status: DC | PRN
  Filled 2015-11-17: qty 1

## 2015-11-17 MED ORDER — PROPOFOL 10 MG/ML IV BOLUS
INTRAVENOUS | Status: DC | PRN
  Administered 2015-11-17: 200 mg via INTRAVENOUS

## 2015-11-17 MED ORDER — DIPHENHYDRAMINE HCL 25 MG PO CAPS
ORAL_CAPSULE | ORAL | Status: AC
Start: 2015-11-17 — End: 2015-11-17
  Filled 2015-11-17: qty 1

## 2015-11-17 MED ORDER — LIDOCAINE HCL (CARDIAC) 20 MG/ML IV SOLN
INTRAVENOUS | Status: DC | PRN
  Administered 2015-11-17: 30 mg via INTRAVENOUS

## 2015-11-17 MED ORDER — OXYCODONE-ACETAMINOPHEN 5-325 MG PO TABS
1.0000 | ORAL_TABLET | ORAL | Status: DC | PRN
  Administered 2015-11-17: 1 via ORAL
  Filled 2015-11-17: qty 1

## 2015-11-17 MED ORDER — SODIUM CHLORIDE 0.9 % IR SOLN
Status: DC | PRN
  Administered 2015-11-17: 3000 mL via INTRAVESICAL
  Administered 2015-11-17: 1000 mL via INTRAVESICAL

## 2015-11-17 MED ORDER — LACTATED RINGERS IV SOLN
INTRAVENOUS | Status: DC
  Administered 2015-11-17: 11:00:00 via INTRAVENOUS
  Filled 2015-11-17: qty 1000

## 2015-11-17 MED ORDER — SULFAMETHOXAZOLE-TRIMETHOPRIM 800-160 MG PO TABS: 1.0000 | ORAL_TABLET | Freq: Two times a day (BID) | ORAL | Status: DC

## 2015-11-17 MED ORDER — ONDANSETRON HCL 4 MG/2ML IJ SOLN
INTRAMUSCULAR | Status: DC | PRN
  Administered 2015-11-17: 4 mg via INTRAVENOUS

## 2015-11-17 MED ORDER — OXYCODONE-ACETAMINOPHEN 5-325 MG PO TABS: 1.0000 | ORAL_TABLET | ORAL | Status: DC | PRN

## 2015-11-17 MED ORDER — DIAZEPAM 5 MG PO TABS
ORAL_TABLET | ORAL | Status: AC
  Filled 2015-11-17: qty 1

## 2015-11-17 MED ORDER — FENTANYL CITRATE (PF) 100 MCG/2ML IJ SOLN
INTRAMUSCULAR | Status: DC | PRN
  Administered 2015-11-17: 100 ug via INTRAVENOUS

## 2015-11-17 MED ORDER — PROMETHAZINE HCL 25 MG/ML IJ SOLN
6.2500 mg | INTRAMUSCULAR | Status: DC | PRN
  Filled 2015-11-17: qty 1

## 2015-11-17 MED ORDER — BELLADONNA ALKALOIDS-OPIUM 16.2-60 MG RE SUPP
1.0000 | Freq: Every day | RECTAL | Status: DC
  Administered 2015-11-17: 1 via RECTAL
  Filled 2015-11-17: qty 1

## 2015-11-17 MED ORDER — CEFAZOLIN IN D5W 1 GM/50ML IV SOLN
1.0000 g | INTRAVENOUS | Status: DC
  Filled 2015-11-17: qty 50

## 2015-11-17 MED ORDER — DIATRIZOATE MEGLUMINE 30 % UR SOLN
URETHRAL | Status: DC | PRN
  Administered 2015-11-17: 10 mL via URETHRAL

## 2015-11-17 MED ORDER — DIAZEPAM 5 MG PO TABS
5.0000 mg | ORAL_TABLET | Freq: Once | ORAL | Status: AC
  Administered 2015-11-17: 5 mg via ORAL
  Filled 2015-11-17: qty 1

## 2015-11-17 MED ORDER — PHENYLEPHRINE HCL 10 MG/ML IJ SOLN
INTRAMUSCULAR | Status: DC | PRN
  Administered 2015-11-17: 40 ug via INTRAVENOUS

## 2015-11-17 MED ORDER — DIPHENHYDRAMINE HCL 25 MG PO CAPS
25.0000 mg | ORAL_CAPSULE | Freq: Once | ORAL | Status: AC
  Administered 2015-11-17: 25 mg via ORAL
  Filled 2015-11-17: qty 1

## 2015-11-17 MED ORDER — BELLADONNA ALKALOIDS-OPIUM 16.2-60 MG RE SUPP
RECTAL | Status: AC
  Filled 2015-11-17: qty 1

## 2015-11-17 MED ORDER — STERILE WATER FOR IRRIGATION IR SOLN
Status: DC | PRN
  Administered 2015-11-17: 500 mL

## 2015-11-17 MED ORDER — CEFAZOLIN SODIUM-DEXTROSE 2-4 GM/100ML-% IV SOLN
2.0000 g | INTRAVENOUS | Status: AC
  Administered 2015-11-17: 2 g via INTRAVENOUS
  Filled 2015-11-17: qty 100

## 2015-11-17 MED FILL — OXYCODONE/APAP 5/325MG: 5-325 | 3 days supply | Qty: 15 | Fill #0

## 2015-11-17 MED FILL — SULFAMETHOXAZOLE-TMP DS TAB: 800-160 | 3 days supply | Qty: 6 | Fill #0

## 2015-11-17 SURGICAL SUPPLY — 32 items
BAG DRAIN URO-CYSTO SKYTR STRL (DRAIN) ×2 IMPLANT
BAG DRN UROCATH (DRAIN) ×1
BASKET DAKOTA 1.9FR 11X120 (BASKET) IMPLANT
BASKET LASER NITINOL 1.9FR (BASKET) IMPLANT
BASKET ZERO TIP NITINOL 2.4FR (BASKET) IMPLANT
BSKT STON RTRVL 120 1.9FR (BASKET)
BSKT STON RTRVL ZERO TP 2.4FR (BASKET)
CANISTER SUCT LVC 12 LTR MEDI- (MISCELLANEOUS) IMPLANT
CATH INTERMIT  6FR 70CM (CATHETERS) IMPLANT
CLOTH BEACON ORANGE TIMEOUT ST (SAFETY) ×2 IMPLANT
EVACUATOR MICROVAS BLADDER (UROLOGICAL SUPPLIES) IMPLANT
FIBER LASER FLEXIVA 1000 (UROLOGICAL SUPPLIES) IMPLANT
FIBER LASER FLEXIVA 200 (UROLOGICAL SUPPLIES) ×1 IMPLANT
FIBER LASER FLEXIVA 365 (UROLOGICAL SUPPLIES) IMPLANT
FIBER LASER FLEXIVA 550 (UROLOGICAL SUPPLIES) IMPLANT
FIBER LASER TRAC TIP (UROLOGICAL SUPPLIES) IMPLANT
GLOVE BIO SURGEON STRL SZ8 (GLOVE) ×2 IMPLANT
GOWN STRL REUS W/ TWL LRG LVL3 (GOWN DISPOSABLE) ×1 IMPLANT
GOWN STRL REUS W/ TWL XL LVL3 (GOWN DISPOSABLE) ×1 IMPLANT
GOWN STRL REUS W/TWL LRG LVL3 (GOWN DISPOSABLE) ×2
GOWN STRL REUS W/TWL XL LVL3 (GOWN DISPOSABLE) ×2
GUIDEWIRE ANG ZIPWIRE 038X150 (WIRE) ×2 IMPLANT
GUIDEWIRE STR DUAL SENSOR (WIRE) ×1 IMPLANT
IV NS IRRIG 3000ML ARTHROMATIC (IV SOLUTION) ×2 IMPLANT
KIT ROOM TURNOVER WOR (KITS) ×2 IMPLANT
MANIFOLD NEPTUNE II (INSTRUMENTS) ×1 IMPLANT
PACK CYSTO (CUSTOM PROCEDURE TRAY) ×2 IMPLANT
SHEATH ACCESS URETERAL 38CM (SHEATH) ×1 IMPLANT
STENT URET 6FRX26 CONTOUR (STENTS) ×1 IMPLANT
SYRINGE 10CC LL (SYRINGE) ×2 IMPLANT
TUBE CONNECTING 12X1/4 (SUCTIONS) ×1 IMPLANT
TUBE FEEDING 8FR 16IN STR KANG (MISCELLANEOUS) IMPLANT

## 2015-11-17 NOTE — Progress Notes (Signed)
C/o sharp cramping pain r flank 7/10. Pt takes 1 tablet benadryl w all narcotics.  Dr. Gifford Shave paged and informed of hx OSA w CPAP, pt uses benadryl w all narcotics.  Voiced concern re: dosage of benadryl (s/p anesthesia ) at home w percocet 5/325.  Orders obtained.  Pt aware that she will need to stay 1 hr longer .  Pt and spouse verbalized their understanding.

## 2015-11-17 NOTE — Discharge Instructions (Signed)
Post Anesthesia Home Care Instructions  Activity: Get plenty of rest for the remainder of the day. A responsible adult should stay with you for 24 hours following the procedure.  For the next 24 hours, DO NOT: -Drive a car -Advertising copywriter -Drink alcoholic beverages -Take any medication unless instructed by your physician -Make any legal decisions or sign important papers.  Meals: Start with liquid foods such as gelatin or soup. Progress to regular foods as tolerated. Avoid greasy, spicy, heavy foods. If nausea and/or vomiting occur, drink only clear liquids until the nausea and/or vomiting subsides. Call your physician if vomiting continues.  Special Instructions/Symptoms: Your throat may feel dry or sore from the anesthesia or the breathing tube placed in your throat during surgery. If this causes discomfort, gargle with warm salt water. The discomfort should disappear within 24 hours.  If you had a scopolamine patch placed behind your ear for the management of post- operative nausea and/or vomiting:  1. The medication in the patch is effective for 72 hours, after which it should be removed.  Wrap patch in a tissue and discard in the trash. Wash hands thoroughly with soap and water. 2. You may remove the patch earlier than 72 hours if you experience unpleasant side effects which may include dry mouth, dizziness or visual disturbances. 3. Avoid touching the patch. Wash your hands with soap and water after contact with the patch.   [facility] [phone] Post Ureteroscopy With or Without Stent Instructions  Definitions:  Ureter: The duct that transports urine from the kidney to the bladder. Stent:   A plastic hollow tube that is placed into the ureter, from the kidney to the bladder to prevent the ureter from swelling shut.  You may remove the stent Friday, 11/21/2015  GENERAL INSTRUCTIONS:  Despite the fact that no skin incisions were used, the area around  the ureter and bladder is raw and irritated. The stent is a foreign body which will further irritate the bladder wall. This irritation is manifested by increased frequency of urination, both day and night, and by an increase in the urge to urinate. In some, the urge to urinate is present almost always. Sometimes the urge is strong enough that you may not be able to stop yourself from urinating. The only real cure is to remove the stent and then give time for the bladder wall to heal which can't be done until the danger of the ureter swelling shut has passed, which varies.  You may see some blood in your urine while the stent is in place and a few days afterwards. Do not be alarmed, even if the urine was clear for a while. Get off your feet and drink lots of fluids until clearing occurs. If you start to pass clots or don't improve, call us.  DIET: You may return to your normal diet immediately. Because of the raw surface of your bladder, alcohol, spicy foods, acid type foods and drinks with caffeine may cause irritation or frequency and should be used in moderation. To keep your urine flowing freely and to avoid constipation, drink plenty of fluids during the day ( 8-10 glasses ). Tip: Avoid cranberry juice because it is very acidic.  ACTIVITY: Your physical activity doesn't need to be restricted. However, if you are very active, you may see some blood in your urine. We suggest that you reduce your activity under these circumstances until the bleeding has stopped.  BOWELS: It is important to keep your bowels regular  during the postoperative period. Straining with bowel movements can cause bleeding. A bowel movement every other day is reasonable. Use a mild laxative if needed, such as Milk of Magnesia 2-3 tablespoons, or 2 Dulcolax tablets. Call if you continue to have problems. If you have been taking narcotics for pain, before, during or after your surgery, you may be constipated. Take a laxative if  necessary.   MEDICATION: You should resume your pre-surgery medications unless told not to. In addition you will often be given an antibiotic to prevent infection. These should be taken as prescribed until the bottles are finished unless you are having an unusual reaction to one of the drugs.  PROBLEMS YOU SHOULD REPORT TO [place]:  Fevers over 100.5 Fahrenheit.  Heavy bleeding, or clots ( See above notes about blood in urine ).  Inability to urinate.  Drug reactions ( hives, rash, nausea, vomiting, diarrhea ).  Severe burning or pain with urination that is not improving.  FOLLOW-UP: You will need a follow-up appointment to monitor your progress. Call for this appointment at the number listed above. Usually the first appointment will be about three to fourteen days after your surgery.

## 2015-11-17 NOTE — Anesthesia Preprocedure Evaluation (Signed)
Anesthesia Evaluation  Patient identified by MRN, date of birth, ID band Patient awake, Patient confused and Patient unresponsive    Reviewed: Allergy & Precautions, NPO status , Patient's Chart, lab work & pertinent test results  Airway Mallampati: II  TM Distance: >3 FB Neck ROM: Full    Dental  (+) Missing,    Pulmonary sleep apnea and Continuous Positive Airway Pressure Ventilation ,    breath sounds clear to auscultation       Cardiovascular hypertension, Pt. on medications  Rhythm:Regular Rate:Normal     Neuro/Psych  Headaches, TIAnegative psych ROS   GI/Hepatic GERD  Medicated,(+) Hepatitis -, Unspecified  Endo/Other  diabetesHypothyroidism   Renal/GU   negative genitourinary   Musculoskeletal negative musculoskeletal ROS (+)   Abdominal   Peds negative pediatric ROS (+)  Hematology negative hematology ROS (+)   Anesthesia Other Findings   Reproductive/Obstetrics negative OB ROS                             Lab Results  Component Value Date   WBC 7.0 10/16/2015   HGB 9.0* 08/29/2015   HCT 33.1* 10/16/2015   MCV 85 10/16/2015   PLT 362 10/16/2015   Lab Results  Component Value Date   CREATININE 0.87 10/16/2015   BUN 18 10/16/2015   NA 142 10/16/2015   K 4.0 10/16/2015   CL 102 10/16/2015   CO2 24 10/16/2015   Lab Results  Component Value Date   INR 1.1 08/02/2013   INR 1.14 04/08/2012   INR 1.7* 04/11/2008   04/2015: EKG: NSR, RBBB  Anesthesia Physical  Anesthesia Plan  ASA: III  Anesthesia Plan: General   Post-op Pain Management:    Induction: Intravenous  Airway Management Planned: LMA  Additional Equipment:   Intra-op Plan:   Post-operative Plan: Extubation in OR  Informed Consent: I have reviewed the patients History and Physical, chart, labs and discussed the procedure including the risks, benefits and alternatives for the proposed anesthesia  with the patient or authorized representative who has indicated his/her understanding and acceptance.   Dental advisory given  Plan Discussed with: CRNA  Anesthesia Plan Comments:         Anesthesia Quick Evaluation

## 2015-11-17 NOTE — Transfer of Care (Signed)
Immediate Anesthesia Transfer of Care Note  Patient: JACOBA BELKEN  Procedure(s) Performed: Procedure(s): CYSTOSCOPY/RETROGRADE PYELOGRAM RIGHT/DIAGNOSTIC RIGHT URETEROSCOPY/LASER RENAL CALCIFICATION/RIGHT STENT PLACEMENT (Right) HOLMIUM LASER APPLICATION (Right)  Patient Location: PACU  Anesthesia Type:General  Level of Consciousness: awake, alert , oriented and patient cooperative  Airway & Oxygen Therapy: Patient Spontanous Breathing and Patient connected to nasal cannula oxygen  Post-op Assessment: Report given to RN and Post -op Vital signs reviewed and stable  Post vital signs: Reviewed and stable  Last Vitals:  Filed Vitals:   11/17/15 0957 11/17/15 1224  BP: 139/70 130/75  Pulse: 67 74  Temp: 36.8 C 36.8 C  Resp: 16 15    Last Pain: There were no vitals filed for this visit.    Patients Stated Pain Goal: 7 ([id number])  Complications: No apparent anesthesia complications

## 2015-11-17 NOTE — Brief Op Note (Signed)
11/17/2015  12:09 PM  PATIENT:  Karen Potter  75 y.o. female  PRE-OPERATIVE DIAGNOSIS:  RIGHT RENAL CALCULI  POST-OPERATIVE DIAGNOSIS:  RIGHT RENAL CALCULI  PROCEDURE:  Procedure(s): CYSTOSCOPY/RETROGRADE/URETEROSCOPY/STONE EXTRACTION WITH BASKET (Right) HOLMIUM LASER APPLICATION (Right)  SURGEON:  Surgeon(s) and Role:    * Cleon Gustin, MD - Primary  PHYSICIAN ASSISTANT:   ASSISTANTS: none   ANESTHESIA:   general  EBL:     BLOOD ADMINISTERED:none  DRAINS: right 6x26 JJ ureteral stent with tether  LOCAL MEDICATIONS USED:  NONE  SPECIMEN:  No Specimen  DISPOSITION OF SPECIMEN:  NA  COUNTS:  YES  TOURNIQUET:  * No tourniquets in log *  DICTATION: .Note written in EPIC  PLAN OF CARE: Discharge to home after PACU  PATIENT DISPOSITION:  PACU - hemodynamically stable.   Delay start of Pharmacological VTE agent (>24hrs) due to surgical blood loss or risk of bleeding: not applicable

## 2015-11-17 NOTE — Anesthesia Procedure Notes (Signed)
Procedure Name: LMA Insertion Date/Time: 11/17/2015 11:37 AM Performed by: Gilmer Mor R Pre-anesthesia Checklist: Patient identified, Patient being monitored, Emergency Drugs available, Timeout performed and Suction available Patient Re-evaluated:Patient Re-evaluated prior to inductionOxygen Delivery Method: Circle System Utilized Preoxygenation: Pre-oxygenation with 100% oxygen Intubation Type: IV induction Ventilation: Mask ventilation without difficulty LMA: LMA inserted LMA Size: 4.0 Number of attempts: 1 Placement Confirmation: positive ETCO2 and breath sounds checked- equal and bilateral

## 2015-11-17 NOTE — Anesthesia Postprocedure Evaluation (Signed)
Anesthesia Post Note  Patient: Karen Potter  Procedure(s) Performed: Procedure(s) (LRB): CYSTOSCOPY/RETROGRADE PYELOGRAM RIGHT/DIAGNOSTIC RIGHT URETEROSCOPY/LASER RENAL CALCIFICATION/RIGHT STENT PLACEMENT (Right) HOLMIUM LASER APPLICATION (Right)  Patient location during evaluation: PACU Anesthesia Type: General Level of consciousness: awake and alert Pain management: pain level controlled Vital Signs Assessment: post-procedure vital signs reviewed and stable Respiratory status: spontaneous breathing, nonlabored ventilation, respiratory function stable and patient connected to nasal cannula oxygen Cardiovascular status: blood pressure returned to baseline and stable Postop Assessment: no signs of nausea or vomiting Anesthetic complications: no    Last Vitals:  Filed Vitals:   11/17/15 1300 11/17/15 1315  BP: 136/61 138/75  Pulse: 75 74  Temp:    Resp: 17 17    Last Pain: There were no vitals filed for this visit.               Tiajuana Amass

## 2015-11-17 NOTE — H&P (Signed)
Urology Admission H&P  Chief Complaint: right flank pain  History of Present Illness:  Karen Potter is a 75yo with a hx of nephrolothiasis here for right URS. She hs intermittent right flank pain. She denies any LUTS  Past Medical History  Diagnosis Date  . 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 Hyperlipidemia   . Hypertension   . Seasonal allergies   . Vitamin D deficiency   . Fatty liver   . GERD (gastroesophageal reflux disease)   . Post-polio limb muscle weakness     left leg  w/ 3 repair surgery's  . OSA on CPAP     severe per study 2003  . History of TIA (transient ischemic attack)     03-23-2014  w/ episode temporay amnesia  . Hypothyroidism   . Type 2 diabetes mellitus ([place])   . Gastric ulcer without hemorrhage or perforation     08-29-2015 per EGD report  . Duodenal ulcer without hemorrhage or perforation     08-29-2015 per EGD reort  . History of kidney stones   . Renal calculi     right side  . OA (osteoarthritis)   . Gastritis     per EGD 08-29-2015  . RBBB (right bundle branch block)   . LAFB (left anterior fascicular block)   . COPD, mild ([place])   . Dyspnea on exertion   . 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 Wears glasses   . Hearing loss     no hearing aids  . Iron deficiency anemia   . History of benign parathyroid tumor     s/p  left superior parathyroidecotmy  05-06-2015   Past Surgical History  Procedure Laterality Date  . Carpal tunnel release Right 05-10-2007  . Parathyroidectomy N/A 05/06/2015    Procedure: PARATHYROIDECTOMY;  Surgeon: Armandina Gemma, MD;  Location: [facility];  Service: General;  Laterality: N/A;   left superior  . Esophagogastroduodenoscopy N/A 08/29/2015    Procedure: ESOPHAGOGASTRODUODENOSCOPY (EGD);  Surgeon: Danie Binder, MD;  Location: [facility];  Service: Endoscopy;  Laterality: N/A;  . Ovarian cyst surgery Right  1966  . Total abdominal hysterectomy w/ bilateral salpingoophorectomy  1984  . Total knee arthroplasty Right 04-08-2008  . Knee arthroscopy Right 1999  . Lumbar spine surgery  09-01-2010    L4 -L5 fusion,  L3 laminectomy,  L5 - S1 foraminotomy  . Transthoracic echocardiogram  04-07-2012    grade 1 diastolic function,  ef [id number]  mild AV calcification without stenosis/  trivial MR  . Tonsillectomy  as child  . Cholecystectomy open  1985    and Appendectomy  . Left heel repair surgery  1954;  1985;  1995    polio  . Cysto/  right ureteroscopic stone extracton/  stent placement  06/ 2016   in [place] Medications:  Prescriptions prior to admission  Medication Sig Dispense Refill Last Dose  . aspirin 325 MG tablet Take 325 mg by mouth every evening.    Past Month at Unknown time  . carvedilol (COREG) 6.25 MG tablet TAKE ONE TABLET BY MOUTH TWICE DAILY WITH  A  MEAL 60 tablet 5 11/17/2015 at 0730  . Cholecalciferol (VITAMIN D3) 2000 UNITS capsule Take 2,000 Units by mouth daily.   Taking  .  clotrimazole-betamethasone (LOTRISONE) cream Apply 1 application topically 2 (two) times daily. (Patient taking differently: Apply 1 application topically 2 (two) times daily as needed. ) 30 g 11 Past Month at Unknown time  . Docosahexaenoic Acid (DHA ALGAL-900 PO) Take 1 tablet by mouth 2 (two) times daily.   11/17/2015 at 0730  . ferrous sulfate 325 (65 FE) MG EC tablet Take 325 mg by mouth 2 (two) times daily.   11/16/2015 at Unknown time  . glimepiride (AMARYL) 2 MG tablet TAKE ONE TABLET BY MOUTH ONCE DAILY BEFORE BREAKFAST 90 tablet 3 11/16/2015 at Unknown time  . glucose blood test strip Test BS 1 time a day 100 each 12 11/16/2015 at Unknown time  . levothyroxine (SYNTHROID, LEVOTHROID) 150 MCG tablet TAKE ONE TABLET BY MOUTH ONCE DAILY (Patient taking differently: TAKE ONE TABLET BY MOUTH ONCE DAILY-  takes only on friday  in am) 30 tablet 11 Past Week at Unknown time  . levothyroxine (SYNTHROID,  LEVOTHROID) 175 MCG tablet TAKE ONE TABLET BY MOUTH ONCE DAILY Sunday - Thursday (take 146mcg on Friday) (Patient taking differently: Take 175 mcg by mouth daily before breakfast. TAKE ONE TABLET BY MOUTH ONCE DAILY Saturday - Thursday (take 135mcg on Friday)) 90 tablet 3 11/17/2015 at 0730  . losartan-hydrochlorothiazide (HYZAAR) 100-25 MG tablet Take 1 tablet by mouth daily. (Patient taking differently: Take 1 tablet by mouth every morning. ) 90 tablet 3 11/16/2015 at Unknown time  . Omega-3 Fatty Acids (FISH OIL TRIPLE STRENGTH) 1400 MG CAPS Take 1,400 mg by mouth daily.    Past Month at Unknown time  . omeprazole (PRILOSEC) 20 MG capsule Take 1 capsule (20 mg total) by mouth 2 (two) times daily before a meal. 180 capsule 3 11/17/2015 at 0730  . ONE TOUCH LANCETS MISC Patient to check BS once daily 200 each 0 11/16/2015 at Unknown time  . umeclidinium-vilanterol (ANORO ELLIPTA) 62.5-25 MCG/INH AEPB Inhale 1 puff into the lungs every morning.   Past Week at Unknown time  . budesonide (RHINOCORT AQUA) 32 MCG/ACT nasal spray Place 2 sprays into both nostrils daily. (Patient taking differently: Place 2 sprays into both nostrils daily as needed. ) 8.6 mL 2 More than a month at Unknown time  . clobetasol cream (TEMOVATE) AB-123456789 % Apply 1 application topically daily as needed (for skinning of skin). Reported on 08/28/2015   More than a month at Unknown time  . guaiFENesin (MUCINEX) 600 MG 12 hr tablet Take 600 mg by mouth 2 (two) times daily as needed for cough or to loosen phlegm. Reported on 10/16/2015   More than a month at Unknown time   Allergies:  Allergies  Allergen Reactions  . Codeine Itching  . Dilaudid [Hydromorphone] Other (See Comments)    Mouth blisters  . Other Itching    Most pain meds cause itching.  When has to take pain medication, she has been instructed to take Benadryl  . Doxycycline Rash    Family History  Problem Relation Age of Onset  . Heart failure Mother   . Cancer Father   .  Cancer Brother    Social History:  reports that she has never smoked. She has never used smokeless tobacco. She reports that she does not drink alcohol or use illicit drugs.  Review of Systems  Genitourinary: Positive for flank pain.  All other systems reviewed and are negative.   Physical Exam:  Vital signs in last 24 hours: Temp:  [98.3 F (36.8 C)] 98.3 F (36.8 C) (  07/10 0957) Pulse Rate:  [67] 67 (07/10 0957) Resp:  [16] 16 (07/10 0957) BP: (139)/(70) 139/70 mmHg (07/10 0957) SpO2:  [97 %] 97 % (07/10 0957) Weight:  [113.172 kg (249 lb 8 oz)] 113.172 kg (249 lb 8 oz) (07/10 0957) Physical Exam  Constitutional: She is oriented to person, place, and time. She appears well-developed and well-nourished.  HENT:  Head: Normocephalic and atraumatic.  Eyes: EOM are normal. Pupils are equal, round, and reactive to light.  Neck: Normal range of motion. No thyromegaly present.  Cardiovascular: Normal rate and regular rhythm.   Respiratory: Effort normal. No respiratory distress.  GI: Soft. She exhibits no distension.  Musculoskeletal: Normal range of motion.  Neurological: She is alert and oriented to person, place, and time.  Skin: Skin is warm and dry.  Psychiatric: She has a normal mood and affect. Her behavior is normal. Judgment and thought content normal.    Laboratory Data:  Results for orders placed or performed during the hospital encounter of 11/17/15 (from the past 24 hour(s))  I-STAT, chem 8     Status: Abnormal   Collection Time: 11/17/15 10:49 AM  Result Value Ref Range   Sodium 144 135 - 145 mmol/L   Potassium 3.9 3.5 - 5.1 mmol/L   Chloride 104 101 - 111 mmol/L   BUN 17 6 - 20 mg/dL   Creatinine, Ser 0.80 0.44 - 1.00 mg/dL   Glucose, Bld 114 (H) 65 - 99 mg/dL   Calcium, Ion 1.24 (H) 1.12 - 1.23 mmol/L   TCO2 26 0 - 100 mmol/L   Hemoglobin 13.6 12.0 - 15.0 g/dL   HCT 40.0 36.0 - 46.0 %   No results found for this or any previous visit (from the past 240  hour(s)). Creatinine:  Recent Labs  11/17/15 1049  CREATININE 0.80   Baseline Creatinine: 0.8  Impression/Assessment:  74yo with right renal calculi  Plan:  The risks/beenfits/alternatives to right ureteroscopic stone extraction was explained to the patient and she understands and wishes to proceed with surgery  Nicolette Bang 11/17/2015, 11:22 AM

## 2015-11-20 ENCOUNTER — Telehealth: Payer: Self-pay | Admitting: Gastroenterology

## 2015-11-20 NOTE — Telephone Encounter (Signed)
Patient called stating that she had gotten a recall letter to schedule an appointment, but she has been seeing a physician in  for several years that can treat the same thing so she is going to continue going there.  But if she decides she is no longer happy with them she wants to see Dr. Oneida Alar again because she really liked her.  Should I take her off the recall list for future procedures and appointments? Please advise.

## 2015-11-21 ENCOUNTER — Encounter (HOSPITAL_BASED_OUTPATIENT_CLINIC_OR_DEPARTMENT_OTHER): Payer: Self-pay | Admitting: Urology

## 2015-11-25 ENCOUNTER — Encounter: Payer: Self-pay | Admitting: Family Medicine

## 2015-11-25 NOTE — Op Note (Signed)
Preoperative diagnosis: Right renal stone  Postoperative diagnosis: Same  Procedure: 1 cystoscopy 2. right retrograde pyelography 3.  Intraoperative fluoroscopy, under one hour, with interpretation 4.  Right ureteroscopic stone manipulation with laser lithotripsy 5.  Right 6 x 26 JJ stent placement  Attending: Rosie Fate  Anesthesia: General  Estimated blood loss: None  Drains: Right 6 x 26 JJ ureteral stent with tether  Specimens: none  Antibiotics: ancef  Findings: Right upper pole calculus. No hydronephrosis. No masses/lesions in the bladder. Ureteral orifices in normal anatomic location.  Indications: Patient is a 75 year old female with a history of right renal stone and who has intermittent right flank pain.  After discussing treatment options, she decided proceed with right ureteroscopic stone manipulation.  Procedure her in detail: The patient was brought to the operating room and a brief timeout was done to ensure correct patient, correct procedure, correct site.  General anesthesia was administered patient was placed in dorsal lithotomy position.  Her genitalia was then prepped and draped in usual sterile fashion.  A rigid 34 French cystoscope was passed in the urethra and the bladder.  Bladder was inspected free masses or lesions.  the right ureteral orifices were in the normal orthotopic locations.  a 6 french ureteral catheter was then instilled into the right ureter orifice.  a gentle retrograde was obtained and findings noted above.  we then placed a zip wire through the ureteral catheter and advanced up to the renal pelvis.  we then removed the cystoscope and cannulated the right ureteral orifice with a semirigid ureteroscope.  No stone was found in the ureter. Once we reached the UPJ a sensor wire was advanced in to the renal pelvis. We then removed the ureteroscope and advanced a flexible ureteroscope over the sensor wire. We encountered the stone in the upper pole.   using using a 200 nm laser fiber and dusted. we then placed a 6 x 26 double-j ureteral stent over the original zip wire. We removed the wire and good coil was noted in the the renal pelvis under fluoroscopy and the bladder under direct vision.     the bladder was then drained and this concluded the procedure which was well tolerated by patient.  Complications: None  Condition: Stable, extubated, transferred to PACU  Plan: Patient is to be discharged home as to follow-up in one week. She is to remove her stent by pulling the tether in 48 hours

## 2015-11-25 NOTE — Telephone Encounter (Signed)
REVIEWED. AGREE. PT SEES DR. MAGOD IN [place]. CANCEL AL FUTURE APPTS.

## 2015-12-02 ENCOUNTER — Encounter: Payer: Self-pay | Admitting: Family Medicine

## 2015-12-04 DIAGNOSIS — N200 Calculus of kidney: Secondary | ICD-10-CM | POA: Diagnosis not present

## 2015-12-05 DIAGNOSIS — K250 Acute gastric ulcer with hemorrhage: Secondary | ICD-10-CM | POA: Diagnosis not present

## 2015-12-05 DIAGNOSIS — K921 Melena: Secondary | ICD-10-CM | POA: Diagnosis not present

## 2015-12-18 DIAGNOSIS — K921 Melena: Secondary | ICD-10-CM | POA: Diagnosis not present

## 2015-12-19 ENCOUNTER — Other Ambulatory Visit: Payer: Self-pay | Admitting: Gastroenterology

## 2015-12-19 DIAGNOSIS — K253 Acute gastric ulcer without hemorrhage or perforation: Secondary | ICD-10-CM | POA: Diagnosis not present

## 2015-12-19 DIAGNOSIS — K259 Gastric ulcer, unspecified as acute or chronic, without hemorrhage or perforation: Secondary | ICD-10-CM | POA: Diagnosis not present

## 2015-12-19 DIAGNOSIS — K3189 Other diseases of stomach and duodenum: Secondary | ICD-10-CM | POA: Diagnosis not present

## 2015-12-19 DIAGNOSIS — K449 Diaphragmatic hernia without obstruction or gangrene: Secondary | ICD-10-CM | POA: Diagnosis not present

## 2015-12-21 ENCOUNTER — Other Ambulatory Visit: Payer: Self-pay | Admitting: Family

## 2016-02-10 ENCOUNTER — Other Ambulatory Visit: Payer: Self-pay | Admitting: Family Medicine

## 2016-02-10 DIAGNOSIS — Z1231 Encounter for screening mammogram for malignant neoplasm of breast: Secondary | ICD-10-CM

## 2016-02-19 ENCOUNTER — Ambulatory Visit: Payer: Medicare Other | Admitting: Family Medicine

## 2016-02-23 ENCOUNTER — Ambulatory Visit
Admission: RE | Admit: 2016-02-23 | Discharge: 2016-02-23 | Disposition: A | Discharge status: Home / Self Care | Payer: Medicare Other | Source: Ambulatory Visit | Attending: Family Medicine | Admitting: Family Medicine

## 2016-02-23 DIAGNOSIS — Z1231 Encounter for screening mammogram for malignant neoplasm of breast: Secondary | ICD-10-CM | POA: Diagnosis not present

## 2016-02-24 ENCOUNTER — Encounter: Payer: Self-pay | Admitting: Family Medicine

## 2016-02-24 ENCOUNTER — Ambulatory Visit (INDEPENDENT_AMBULATORY_CARE_PROVIDER_SITE_OTHER): Payer: Medicare Other | Admitting: Family Medicine

## 2016-02-24 VITALS — BP 123/72 | HR 68 | Temp 97.5°F | Ht 64.5 in | Wt 249.0 lb

## 2016-02-24 DIAGNOSIS — K219 Gastro-esophageal reflux disease without esophagitis: Secondary | ICD-10-CM

## 2016-02-24 DIAGNOSIS — E559 Vitamin D deficiency, unspecified: Secondary | ICD-10-CM | POA: Diagnosis not present

## 2016-02-24 DIAGNOSIS — E7800 Pure hypercholesterolemia, unspecified: Secondary | ICD-10-CM

## 2016-02-24 DIAGNOSIS — E119 Type 2 diabetes mellitus without complications: Secondary | ICD-10-CM

## 2016-02-24 DIAGNOSIS — Z23 Encounter for immunization: Secondary | ICD-10-CM | POA: Diagnosis not present

## 2016-02-24 DIAGNOSIS — I10 Essential (primary) hypertension: Secondary | ICD-10-CM

## 2016-02-24 LAB — BAYER DCA HB A1C WAIVED: HB A1C (BAYER DCA - WAIVED): 6.3 % (ref <7.0)

## 2016-02-24 NOTE — Progress Notes (Signed)
Subjective:    Patient ID: Karen Potter, female    DOB: [birth year], 75 y.o.   MRN: [id number]  HPI Pt here for follow up and management of chronic medical problems which includes diabetes and hyperlipidemia. She is taking medications regularly. Her concern today is right shoulder pain. There is been no injury or trauma. The pain is intermittent. Since her last visit her malaise and fatigue has improved. She does complain of some chronic cough. She denies postnasal drainage. She is on PPI once a day.   Patient Active Problem List   Diagnosis Date Noted  . Bleeding gastrointestinal   . Primary hyperparathyroidism ([place]) 05/06/2015  . Hyperparathyroidism, primary ([place]) 05/05/2015  . Diabetes mellitus without complication ([place])   . Chest pain 04/02/2014  . GERD (gastroesophageal reflux disease) 11/05/2013  . Fatty liver 06/01/2013  . Hypercalcemia 05/14/2013  . Abnormal transaminases 05/14/2013  . Hot flashes 05/14/2013  . Hyperglycemia   . Hyperlipidemia   . Hypertension   . Hypothyroid   . Migraine   . Polio   . Amnesia   . Seasonal allergies   . Vitamin D deficiency   . Obesity   . Aphasia 04/11/2012  . TIA (transient ischemic attack) 04/11/2012  . 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 OBSTRUCTIVE SLEEP APNEA 05/31/2008  . Essential hypertension 05/31/2008  . OVARIAN CYST 05/31/2008  . CHOLECYSTECTOMY, HX OF 05/31/2008  . DYSPNEA 06/01/2007  . CHEST PAIN 06/01/2007   Outpatient Encounter Prescriptions as of 02/24/2016  Medication Sig  . aspirin 325 MG tablet Take 325 mg by mouth every evening.   . carvedilol (COREG) 6.25 MG tablet TAKE ONE TABLET BY MOUTH TWICE DAILY WITH  A  MEAL  . Cholecalciferol (VITAMIN D3) 2000 UNITS capsule Take 2,000 Units by mouth daily.  . clobetasol cream (TEMOVATE) AB-123456789 % Apply 1 application topically daily as needed (for skinning of skin). Reported on 08/28/2015  . clotrimazole-betamethasone (LOTRISONE) cream APPLY TOPICALLY TWICE DAILY  .  Docosahexaenoic Acid (DHA ALGAL-900 PO) Take 1 tablet by mouth 2 (two) times daily.  . ferrous sulfate 325 (65 FE) MG EC tablet Take 325 mg by mouth daily with breakfast.   . glimepiride (AMARYL) 2 MG tablet TAKE ONE TABLET BY MOUTH ONCE DAILY BEFORE BREAKFAST  . glucose blood test strip Test BS 1 time a day  . levothyroxine (SYNTHROID, LEVOTHROID) 150 MCG tablet TAKE ONE TABLET BY MOUTH ONCE DAILY (Patient taking differently: TAKE ONE TABLET BY MOUTH ONCE DAILY-  takes only on friday  in am)  . levothyroxine (SYNTHROID, LEVOTHROID) 175 MCG tablet TAKE ONE TABLET BY MOUTH ONCE DAILY Sunday - Thursday (take 162mcg on Friday) (Patient taking differently: Take 175 mcg by mouth daily before breakfast. TAKE ONE TABLET BY MOUTH ONCE DAILY Saturday - Thursday (take 122mcg on Friday))  . losartan-hydrochlorothiazide (HYZAAR) 100-25 MG tablet Take 1 tablet by mouth daily. (Patient taking differently: Take 1 tablet by mouth every morning. )  . Omega-3 Fatty Acids (FISH OIL TRIPLE STRENGTH) 1400 MG CAPS Take 1,400 mg by mouth daily.   Marland Kitchen omeprazole (PRILOSEC) 20 MG capsule Take 1 capsule (20 mg total) by mouth 2 (two) times daily before a meal.  . ONE TOUCH LANCETS MISC Patient to check BS once daily  . guaiFENesin (MUCINEX) 600 MG 12 hr tablet Take 600 mg by mouth 2 (two) times daily as needed for cough or to loosen phlegm. Reported on 10/16/2015  . oxyCODONE-acetaminophen (ROXICET) 5-325 MG tablet Take 1 tablet by mouth every 4 (four)  hours as needed for severe pain. (Patient not taking: Reported on 02/24/2016)  . umeclidinium-vilanterol (ANORO ELLIPTA) 62.5-25 MCG/INH AEPB Inhale 1 puff into the lungs every morning.  . [DISCONTINUED] budesonide (RHINOCORT AQUA) 32 MCG/ACT nasal spray Place 2 sprays into both nostrils daily. (Patient not taking: Reported on 02/24/2016)  . [DISCONTINUED] sulfamethoxazole-trimethoprim (BACTRIM DS,SEPTRA DS) 800-160 MG tablet Take 1 tablet by mouth 2 (two) times daily.   No  facility-administered encounter medications on file as of 02/24/2016.       Review of Systems  Constitutional: Negative.   HENT: Negative.   Eyes: Negative.   Respiratory: Negative.   Cardiovascular: Negative.   Gastrointestinal: Negative.   Endocrine: Negative.   Genitourinary: Negative.   Musculoskeletal: Positive for arthralgias (right shoulder at times).  Skin: Negative.   Allergic/Immunologic: Negative.   Neurological: Negative.   Hematological: Negative.   Psychiatric/Behavioral: Negative.        Objective:   Physical Exam  Constitutional: She is oriented to person, place, and time. She appears well-developed and well-nourished.  HENT:  Head: Normocephalic.  Mouth/Throat: Oropharynx is clear and moist.  Eyes: Pupils are equal, round, and reactive to light.  Cardiovascular: Normal rate and normal heart sounds.   Pulmonary/Chest: Effort normal and breath sounds normal.  Musculoskeletal:  Right shoulder: Normal range of motion and strength there is some tenderness just inferior to the acromion injection with Depo-Medrol and Marcaine given in this area of maximal tenderness. This is probably consistent with some bursitis.  Neurological: She is alert and oriented to person, place, and time.   BP 123/72 (BP Location: Left Arm)   Pulse 68   Temp 97.5 F (36.4 C) (Oral)   Ht 5' 4.5" (1.638 m)   Wt 249 lb (112.9 kg)   BMI 42.08 kg/m         Assessment & Plan:  1. Pure hypercholesterolemia Lipids were last assessed 4 months ago and were at goal   2. Diabetes mellitus without complication (HCC) 123456 was 6. 6 months ago - Bayer DCA Hb A1c Waived  3. Essential hypertension Blood pressure well controlled 123/72  4. Vitamin D deficiency   5. Gastroesophageal reflux disease without esophagitis Having been on 2 per day previously She is down to 1 omeprazole per day  6. Encounter for immunization  - Flu vaccine HIGH DOSE PF  Wardell Honour MD

## 2016-02-24 NOTE — Patient Instructions (Signed)
Medicare Annual Wellness Visit  [facility] and the medical providers at [facility] strive to bring you the best medical care.  In doing so we not only want to address your current medical conditions and concerns but also to detect new conditions early and prevent illness, disease and health-related problems.    Medicare offers a yearly Wellness Visit which allows our clinical staff to assess your need for preventative services including immunizations, lifestyle education, counseling to decrease risk of preventable diseases and screening for fall risk and other medical concerns.    This visit is provided free of charge (no copay) for all Medicare recipients. The clinical pharmacists at [facility] have begun to conduct these Wellness Visits which will also include a thorough review of all your medications.    As you primary medical provider recommend that you make an appointment for your Annual Wellness Visit if you have not done so already this year.  You may set up this appointment before you leave today or you may call back ([phone]) and schedule an appointment.  Please make sure when you call that you mention that you are scheduling your Annual Wellness Visit with the clinical pharmacist so that the appointment may be made for the proper length of time.     Continue current medications. Continue good therapeutic lifestyle changes which include good diet and exercise. Fall precautions discussed with patient. If an FOBT was given today- please return it to our front desk. If you are over 50 years old - you may need Prevnar 13 or the adult Pneumonia vaccine.  **Flu shots are available--- please call and schedule a FLU-CLINIC appointment**  After your visit with us today you will receive a survey in the mail or online from [employer] regarding your care with us. Please take a moment to fill this out. Your feedback is very  important to us as you can help us better understand your patient needs as well as improve your experience and satisfaction. WE CARE ABOUT YOU!!!    

## 2016-03-21 ENCOUNTER — Other Ambulatory Visit: Payer: Self-pay | Admitting: Family

## 2016-04-09 DIAGNOSIS — K921 Melena: Secondary | ICD-10-CM | POA: Diagnosis not present

## 2016-04-09 DIAGNOSIS — A09 Infectious gastroenteritis and colitis, unspecified: Secondary | ICD-10-CM | POA: Diagnosis not present

## 2016-05-22 DIAGNOSIS — G4733 Obstructive sleep apnea (adult) (pediatric): Secondary | ICD-10-CM | POA: Diagnosis not present

## 2016-06-29 ENCOUNTER — Ambulatory Visit: Payer: Medicare Other | Admitting: Family Medicine

## 2016-07-09 ENCOUNTER — Ambulatory Visit: Payer: Medicare Other | Admitting: Family Medicine

## 2016-07-09 ENCOUNTER — Encounter: Payer: Self-pay | Admitting: Family Medicine

## 2016-07-09 NOTE — Telephone Encounter (Signed)
Tell pt. To try taking glimepiride 1/2 tab daily instead of the whole pill

## 2016-07-13 ENCOUNTER — Other Ambulatory Visit: Payer: Self-pay | Admitting: Family Medicine

## 2016-07-16 ENCOUNTER — Ambulatory Visit (INDEPENDENT_AMBULATORY_CARE_PROVIDER_SITE_OTHER): Payer: Medicare Other | Admitting: Family

## 2016-07-16 ENCOUNTER — Encounter: Payer: Self-pay | Admitting: Family

## 2016-07-16 ENCOUNTER — Ambulatory Visit (INDEPENDENT_AMBULATORY_CARE_PROVIDER_SITE_OTHER): Payer: Medicare Other

## 2016-07-16 VITALS — BP 136/80 | HR 64 | Temp 97.0°F | Ht 64.5 in | Wt 249.4 lb

## 2016-07-16 DIAGNOSIS — M25551 Pain in right hip: Secondary | ICD-10-CM

## 2016-07-16 DIAGNOSIS — M5441 Lumbago with sciatica, right side: Secondary | ICD-10-CM

## 2016-07-16 MED ORDER — KETOROLAC TROMETHAMINE 60 MG/2ML IM SOLN
30.0000 mg | Freq: Once | INTRAMUSCULAR | Status: AC
  Administered 2016-07-16: 30 mg via INTRAMUSCULAR

## 2016-07-16 MED ORDER — METHYLPREDNISOLONE ACETATE 80 MG/ML IJ SUSP
40.0000 mg | Freq: Once | INTRAMUSCULAR | Status: AC
  Administered 2016-07-16: 40 mg via INTRAMUSCULAR

## 2016-07-16 NOTE — Progress Notes (Addendum)
   Subjective:    Patient ID: Karen Potter, female    DOB: 07-26-1940, 76 y.o.   MRN: [id number]  HPI PT presents to the office today right groin/hip, leg, and back pain that started last week that has become worse. Pt denies any injury that she is aware of and denies any urinary symptoms. The pain is aching intermittent of 5 out 10. Pt states the pain is 0 when siting, but worsens when she stands and the pain is a 10 if she bends down to pick up something.    Review of Systems  Musculoskeletal: Positive for arthralgias, back pain and joint swelling.  All other systems reviewed and are negative.      Objective:   Physical Exam  Constitutional: She is oriented to person, place, and time. She appears well-developed and well-nourished. No distress.  HENT:  Head: Normocephalic and atraumatic.  Eyes: Pupils are equal, round, and reactive to light.  Neck: Normal range of motion. Neck supple. No thyromegaly present.  Cardiovascular: Normal rate, regular rhythm, normal heart sounds and intact distal pulses.   No murmur heard. Pulmonary/Chest: Effort normal and breath sounds normal. No respiratory distress. She has no wheezes.  Abdominal: Soft. Bowel sounds are normal. She exhibits no distension. There is no tenderness.  Musculoskeletal: Normal range of motion. She exhibits no edema or tenderness.  Full ROM, pain with right hip with external rotation and standing   Neurological: She is alert and oriented to person, place, and time.  Skin: Skin is warm and dry.  Psychiatric: She has a normal mood and affect. Her behavior is normal. Judgment and thought content normal.  Vitals reviewed.     BP 136/80   Pulse 64   Temp 97 F (36.1 C) (Oral)   Ht 5' 4.5" (1.638 m)   Wt 249 lb 6.4 oz (113.1 kg)   BMI 42.15 kg/m        Assessment & Plan:  1. Acute right-sided low back pain with right-sided sciatica - ketorolac (TORADOL) injection 30 mg; Inject 1 mL (30 mg total) into the muscle  once. - methylPREDNISolone acetate (DEPO-MEDROL) injection 40 mg; Inject 0.5 mLs (40 mg total) into the muscle once. - DG HIP UNILAT W OR W/O PELVIS 2-3 VIEWS RIGHT; Future  2. Right hip pain - ketorolac (TORADOL) injection 30 mg; Inject 1 mL (30 mg total) into the muscle once. - methylPREDNISolone acetate (DEPO-MEDROL) injection 40 mg; Inject 0.5 mLs (40 mg total) into the muscle once. - DG HIP UNILAT W OR W/O PELVIS 2-3 VIEWS RIGHT; Future  Rest Ice and heat ROM exercises discussed Strict low carb diet RTO prn   Evelina Dun, FNP

## 2016-07-16 NOTE — Patient Instructions (Signed)
Sciatica Rehab Ask your health care provider which exercises are safe for you. Do exercises exactly as told by your health care provider and adjust them as directed. It is normal to feel mild stretching, pulling, tightness, or discomfort as you do these exercises, but you should stop right away if you feel sudden pain or your pain gets worse.Do not begin these exercises until told by your health care provider. Stretching and range of motion exercises These exercises warm up your muscles and joints and improve the movement and flexibility of your hips and your back. These exercises also help to relieve pain, numbness, and tingling. Exercise A: Sciatic nerve glide  1. Sit in a chair with your head facing down toward your chest. Place your hands behind your back. Let your shoulders slump forward. 2. Slowly straighten one of your knees while you tilt your head back as if you are looking toward the ceiling. Only straighten your leg as far as you can without making your symptoms worse. 3. Hold for __________ seconds. 4. Slowly return to the starting position. 5. Repeat with your other leg. Repeat __________ times. Complete this exercise __________ times a day. Exercise B: Knee to chest with hip adduction and internal rotation    1. Lie on your back on a firm surface with both legs straight. 2. Bend one of your knees and move it up toward your chest until you feel a gentle stretch in your lower back and buttock. Then, move your knee toward the shoulder that is on the opposite side from your leg. ? Hold your leg in this position by holding onto the front of your knee. 3. Hold for __________ seconds. 4. Slowly return to the starting position. 5. Repeat with your other leg. Repeat __________ times. Complete this exercise __________ times a day. Exercise C: Prone extension on elbows    1. Lie on your abdomen on a firm surface. A bed may be too soft for this exercise. 2. Prop yourself up on your  elbows. 3. Use your arms to help lift your chest up until you feel a gentle stretch in your abdomen and your lower back. ? This will place some of your body weight on your elbows. If this is uncomfortable, try stacking pillows under your chest. ? Your hips should stay down, against the surface that you are lying on. Keep your hip and back muscles relaxed. 4. Hold for __________ seconds. 5. Slowly relax your upper body and return to the starting position. Repeat __________ times. Complete this exercise __________ times a day. Strengthening exercises These exercises build strength and endurance in your back. Endurance is the ability to use your muscles for a long time, even after they get tired. Exercise D: Pelvic tilt  1. Lie on your back on a firm surface. Bend your knees and keep your feet flat. 2. Tense your abdominal muscles. Tip your pelvis up toward the ceiling and flatten your lower back into the floor. ? To help with this exercise, you may place a small towel under your lower back and try to push your back into the towel. 3. Hold for __________ seconds. 4. Let your muscles relax completely before you repeat this exercise. Repeat __________ times. Complete this exercise __________ times a day. Exercise E: Alternating arm and leg raises    1. Get on your hands and knees on a firm surface. If you are on a hard floor, you may want to use padding to cushion your knees, such as   an exercise mat. 2. Line up your arms and legs. Your hands should be below your shoulders, and your knees should be below your hips. 3. Lift your left leg behind you. At the same time, raise your right arm and straighten it in front of you. ? Do not lift your leg higher than your hip. ? Do not lift your arm higher than your shoulder. ? Keep your abdominal and back muscles tight. ? Keep your hips facing the ground. ? Do not arch your back. ? Keep your balance carefully, and do not hold your breath. 4. Hold for  __________ seconds. 5. Slowly return to the starting position and repeat with your right leg and your left arm. Repeat __________ times. Complete this exercise __________ times a day. Posture and body mechanics    Body mechanics refers to the movements and positions of your body while you do your daily activities. Posture is part of body mechanics. Good posture and healthy body mechanics can help to relieve stress in your body's tissues and joints. Good posture means that your spine is in its natural S-curve position (your spine is neutral), your shoulders are pulled back slightly, and your head is not tipped forward. The following are general guidelines for applying improved posture and body mechanics to your everyday activities. Standing     When standing, keep your spine neutral and your feet about hip-width apart. Keep a slight bend in your knees. Your ears, shoulders, and hips should line up.  When you do a task in which you stand in one place for a long time, place one foot up on a stable object that is 2-4 inches (5-10 cm) high, such as a footstool. This helps keep your spine neutral. Sitting     When sitting, keep your spine neutral and keep your feet flat on the floor. Use a footrest, if necessary, and keep your thighs parallel to the floor. Avoid rounding your shoulders, and avoid tilting your head forward.  When working at a desk or a computer, keep your desk at a height where your hands are slightly lower than your elbows. Slide your chair under your desk so you are close enough to maintain good posture.  When working at a computer, place your monitor at a height where you are looking straight ahead and you do not have to tilt your head forward or downward to look at the screen. Resting     When lying down and resting, avoid positions that are most painful for you.  If you have pain with activities such as sitting, bending, stooping, or squatting (flexion-based  activities), lie in a position in which your body does not bend very much. For example, avoid curling up on your side with your arms and knees near your chest (fetal position).  If you have pain with activities such as standing for a long time or reaching with your arms (extension-based activities), lie with your spine in a neutral position and bend your knees slightly. Try the following positions: ? Lying on your side with a pillow between your knees. ? Lying on your back with a pillow under your knees. Lifting     When lifting objects, keep your feet at least shoulder-width apart and tighten your abdominal muscles.  Bend your knees and hips and keep your spine neutral. It is important to lift using the strength of your legs, not your back. Do not lock your knees straight out.  Always ask for help to lift heavy   or awkward objects. This information is not intended to replace advice given to you by your health care provider. Make sure you discuss any questions you have with your health care provider. Document Released: 04/26/2005 Document Revised: 01/01/2016 Document Reviewed: 01/10/2015 Elsevier Interactive Patient Education  2017 [employer]          

## 2016-07-22 ENCOUNTER — Ambulatory Visit: Payer: Medicare Other | Admitting: Family Medicine

## 2016-07-23 ENCOUNTER — Encounter: Payer: Self-pay | Admitting: Family Medicine

## 2016-07-23 ENCOUNTER — Ambulatory Visit (INDEPENDENT_AMBULATORY_CARE_PROVIDER_SITE_OTHER): Payer: Medicare Other | Admitting: Family Medicine

## 2016-07-23 VITALS — BP 117/57 | HR 63 | Temp 97.4°F | Ht 64.5 in | Wt 243.0 lb

## 2016-07-23 DIAGNOSIS — E7800 Pure hypercholesterolemia, unspecified: Secondary | ICD-10-CM | POA: Diagnosis not present

## 2016-07-23 DIAGNOSIS — E119 Type 2 diabetes mellitus without complications: Secondary | ICD-10-CM

## 2016-07-23 DIAGNOSIS — K219 Gastro-esophageal reflux disease without esophagitis: Secondary | ICD-10-CM

## 2016-07-23 DIAGNOSIS — I10 Essential (primary) hypertension: Secondary | ICD-10-CM | POA: Diagnosis not present

## 2016-07-23 DIAGNOSIS — E559 Vitamin D deficiency, unspecified: Secondary | ICD-10-CM | POA: Diagnosis not present

## 2016-07-23 LAB — BAYER DCA HB A1C WAIVED: HB A1C (BAYER DCA - WAIVED): 6.4 % (ref <7.0)

## 2016-07-23 NOTE — Patient Instructions (Signed)
Medicare Annual Wellness Visit   and the medical providers at [facility] strive to bring you the best medical care.  In doing so we not only want to address your current medical conditions and concerns but also to detect new conditions early and prevent illness, disease and health-related problems.    Medicare offers a yearly Wellness Visit which allows our clinical staff to assess your need for preventative services including immunizations, lifestyle education, counseling to decrease risk of preventable diseases and screening for fall risk and other medical concerns.    This visit is provided free of charge (no copay) for all Medicare recipients. The clinical pharmacists at [facility] have begun to conduct these Wellness Visits which will also include a thorough review of all your medications.    As you primary medical provider recommend that you make an appointment for your Annual Wellness Visit if you have not done so already this year.  You may set up this appointment before you leave today or you may call back ([phone]) and schedule an appointment.  Please make sure when you call that you mention that you are scheduling your Annual Wellness Visit with the clinical pharmacist so that the appointment may be made for the proper length of time.     Continue current medications. Continue good therapeutic lifestyle changes which include good diet and exercise. Fall precautions discussed with patient. If an FOBT was given today- please return it to our front desk. If you are over 50 years old - you may need Prevnar 13 or the adult Pneumonia vaccine.  **Flu shots are available--- please call and schedule a FLU-CLINIC appointment**  After your visit with us today you will receive a survey in the mail or online from [employer] regarding your care with us. Please take a moment to fill this out. Your feedback is very  important to us as you can help us better understand your patient needs as well as improve your experience and satisfaction. WE CARE ABOUT YOU!!!    

## 2016-07-23 NOTE — Progress Notes (Signed)
 Subjective:    Patient ID: Karen Potter, female    DOB: 04/11/1941, 75 y.o.   MRN: [id number]  HPI Pt here for follow up and management of chronic medical problems which includes hyperlipidemia and diabetes. She is taking medication regularly. Patient has been having some sugars in the 60s and A few below 60 on a sulfonylurea. She has had the dose and she may need to happen again if sugars continue to low. At the same time she is unable diet to lose weight.    Patient Active Problem List   Diagnosis Date Noted  . Bleeding gastrointestinal   . Primary hyperparathyroidism (HCC) 05/06/2015  . Hyperparathyroidism, primary (HCC) 05/05/2015  . Diabetes mellitus without complication (HCC)   . Chest pain 04/02/2014  . GERD (gastroesophageal reflux disease) 11/05/2013  . Fatty liver 06/01/2013  . Hypercalcemia 05/14/2013  . Abnormal transaminases 05/14/2013  . Hot flashes 05/14/2013  . Hyperglycemia   . Hyperlipidemia   . Hypertension   . Hypothyroid   . Migraine   . Polio   . Amnesia   . Seasonal allergies   . Vitamin D deficiency   . Obesity   . Aphasia 04/11/2012  . TIA (transient ischemic attack) 04/11/2012  . 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 OBSTRUCTIVE SLEEP APNEA 05/31/2008  . Essential hypertension 05/31/2008  . OVARIAN CYST 05/31/2008  . CHOLECYSTECTOMY, HX OF 05/31/2008  . DYSPNEA 06/01/2007  . CHEST PAIN 06/01/2007   Outpatient Encounter Prescriptions as of 07/23/2016  Medication Sig  . aspirin 325 MG tablet Take 325 mg by mouth every evening.   . carvedilol (COREG) 6.25 MG tablet TAKE ONE TABLET BY MOUTH TWICE DAILY WITH MEALS  . Cholecalciferol (VITAMIN D3) 2000 UNITS capsule Take 2,000 Units by mouth daily.  . clobetasol cream (TEMOVATE) 0.05 % Apply 1 application topically daily as needed (for skinning of skin). Reported on 08/28/2015  . clotrimazole-betamethasone (LOTRISONE) cream APPLY TOPICALLY TWICE DAILY  . Docosahexaenoic Acid (DHA ALGAL-900 PO) Take 1  tablet by mouth 2 (two) times daily.  . ferrous sulfate 325 (65 FE) MG EC tablet Take 325 mg by mouth daily with breakfast.   . glimepiride (AMARYL) 2 MG tablet Take 1 mg by mouth daily with breakfast.  . glucose blood test strip Test BS 1 time a day  . guaiFENesin (MUCINEX) 600 MG 12 hr tablet Take 600 mg by mouth 2 (two) times daily as needed for cough or to loosen phlegm. Reported on 10/16/2015  . levothyroxine (SYNTHROID, LEVOTHROID) 150 MCG tablet TAKE ONE TABLET BY MOUTH ONCE DAILY (Patient taking differently: TAKE ONE TABLET BY MOUTH ONCE DAILY-  takes only on friday  in am)  . levothyroxine (SYNTHROID, LEVOTHROID) 175 MCG tablet TAKE ONE TABLET BY MOUTH ONCE DAILY Sunday - Thursday (take 150mcg on Friday) (Patient taking differently: Take 175 mcg by mouth daily before breakfast. TAKE ONE TABLET BY MOUTH ONCE DAILY Saturday - Thursday (take 150mcg on Friday))  . losartan-hydrochlorothiazide (HYZAAR) 100-25 MG tablet Take 1 tablet by mouth daily. (Patient taking differently: Take 1 tablet by mouth every morning. )  . Omega-3 Fatty Acids (FISH OIL TRIPLE STRENGTH) 1400 MG CAPS Take 1,400 mg by mouth daily.   . omeprazole (PRILOSEC) 20 MG capsule Take 1 capsule (20 mg total) by mouth 2 (two) times daily before a meal.  . ONE TOUCH LANCETS MISC Patient to check BS once daily  . umeclidinium-vilanterol (ANORO ELLIPTA) 62.5-25 MCG/INH AEPB Inhale 1 puff into the lungs every morning.  . [DISCONTINUED]   glimepiride (AMARYL) 2 MG tablet TAKE ONE TABLET BY MOUTH ONCE DAILY BEFORE BREAKFAST (Patient taking differently: Take 1 mg by mouth daily with breakfast. TAKE ONE TABLET BY MOUTH ONCE DAILY BEFORE BREAKFAST)   No facility-administered encounter medications on file as of 07/23/2016.       Review of Systems  Constitutional: Negative.   HENT: Negative.   Eyes: Negative.   Respiratory: Negative.   Cardiovascular: Negative.   Gastrointestinal: Negative.   Endocrine: Negative.   Genitourinary:  Negative.   Musculoskeletal: Negative.   Skin: Negative.        Skin lesions - face  Allergic/Immunologic: Negative.   Neurological: Negative.   Hematological: Negative.   Psychiatric/Behavioral: Negative.        Objective:   Physical Exam  Constitutional: She is oriented to person, place, and time. She appears well-developed and well-nourished.  Cardiovascular: Normal rate, regular rhythm and normal heart sounds.   Pulmonary/Chest: Effort normal and breath sounds normal.  Neurological: She is alert and oriented to person, place, and time.  Psychiatric: She has a normal mood and affect. Her behavior is normal.   BP (!) 117/57 (BP Location: Left Arm)   Pulse 63   Temp 97.4 F (36.3 C) (Oral)   Ht 5' 4.5" (1.638 m)   Wt 243 lb (110.2 kg)   BMI 41.07 kg/m         Assessment & Plan:  1. Pure hypercholesterolemia Lipids have been well controlled in past - Lipid panel  2. Diabetes mellitus without complication (HCC) Last Z6X was 6.2 anticipate level today being in the same range - Bayer DCA Hb A1c Waived  3. Essential hypertension Blood pressure is good at 117/57 - CMP14+EGFR  4. Vitamin D deficiency Did not see previous level vitamin D in the last year  67. Gastroesophageal reflux disease without esophagitis Symptoms controlled with omeprazole continue same  Wardell Honour MD

## 2016-07-24 LAB — CMP14+EGFR
ALT: 29 IU/L (ref 0–32)
AST: 21 IU/L (ref 0–40)
Albumin/Globulin Ratio: 1.2 (ref 1.2–2.2)
Albumin: 4.2 g/dL (ref 3.5–4.8)
Alkaline Phosphatase: 82 IU/L (ref 39–117)
BUN/Creatinine Ratio: 25 (ref 12–28)
BUN: 23 mg/dL (ref 8–27)
Bilirubin Total: 0.4 mg/dL (ref 0.0–1.2)
CO2: 25 mmol/L (ref 18–29)
Calcium: 9.4 mg/dL (ref 8.7–10.3)
Chloride: 98 mmol/L (ref 96–106)
Creatinine, Ser: 0.91 mg/dL (ref 0.57–1.00)
GFR calc Af Amer: 72 mL/min/{1.73_m2} (ref >59)
GFR calc non Af Amer: 62 mL/min/{1.73_m2} (ref >59)
Globulin, Total: 3.4 g/dL (ref 1.5–4.5)
Glucose: 97 mg/dL (ref 65–99)
Potassium: 4.3 mmol/L (ref 3.5–5.2)
Sodium: 140 mmol/L (ref 134–144)
Total Protein: 7.6 g/dL (ref 6.0–8.5)

## 2016-07-24 LAB — LIPID PANEL
Chol/HDL Ratio: 3.2 ratio units (ref 0.0–4.4)
Cholesterol, Total: 114 mg/dL (ref 100–199)
HDL: 36 mg/dL — ABNORMAL LOW (ref >39)
LDL Calculated: 53 mg/dL (ref 0–99)
Triglycerides: 123 mg/dL (ref 0–149)
VLDL Cholesterol Cal: 25 mg/dL (ref 5–40)

## 2016-10-11 ENCOUNTER — Other Ambulatory Visit: Payer: Self-pay | Admitting: Family Medicine

## 2016-10-14 DIAGNOSIS — H5203 Hypermetropia, bilateral: Secondary | ICD-10-CM | POA: Diagnosis not present

## 2016-10-14 DIAGNOSIS — E119 Type 2 diabetes mellitus without complications: Secondary | ICD-10-CM | POA: Diagnosis not present

## 2016-10-28 ENCOUNTER — Ambulatory Visit (INDEPENDENT_AMBULATORY_CARE_PROVIDER_SITE_OTHER): Payer: Medicare Other | Admitting: Family Medicine

## 2016-10-28 ENCOUNTER — Encounter: Payer: Self-pay | Admitting: Family Medicine

## 2016-10-28 ENCOUNTER — Ambulatory Visit (INDEPENDENT_AMBULATORY_CARE_PROVIDER_SITE_OTHER): Payer: Medicare Other

## 2016-10-28 VITALS — BP 107/72 | HR 71 | Temp 97.7°F | Ht 64.5 in | Wt 250.0 lb

## 2016-10-28 DIAGNOSIS — I10 Essential (primary) hypertension: Secondary | ICD-10-CM | POA: Diagnosis not present

## 2016-10-28 DIAGNOSIS — Z862 Personal history of diseases of the blood and blood-forming organs and certain disorders involving the immune mechanism: Secondary | ICD-10-CM

## 2016-10-28 DIAGNOSIS — E039 Hypothyroidism, unspecified: Secondary | ICD-10-CM | POA: Diagnosis not present

## 2016-10-28 DIAGNOSIS — R2242 Localized swelling, mass and lump, left lower limb: Secondary | ICD-10-CM | POA: Diagnosis not present

## 2016-10-28 DIAGNOSIS — E559 Vitamin D deficiency, unspecified: Secondary | ICD-10-CM

## 2016-10-28 DIAGNOSIS — E119 Type 2 diabetes mellitus without complications: Secondary | ICD-10-CM

## 2016-10-28 DIAGNOSIS — E7800 Pure hypercholesterolemia, unspecified: Secondary | ICD-10-CM | POA: Diagnosis not present

## 2016-10-28 LAB — BAYER DCA HB A1C WAIVED: HB A1C (BAYER DCA - WAIVED): 5.7 % (ref <7.0)

## 2016-10-28 NOTE — Progress Notes (Signed)
BP 107/72   Pulse 71   Temp 97.7 F (36.5 C) (Oral)   Ht 5' 4.5" (1.638 m)   Wt 250 lb (113.4 kg)   BMI 42.25 kg/m    Subjective:    Patient ID: Karen Potter, female    DOB: Dec 30, 1940, 76 y.o.   MRN: [id number]  HPI: Karen Potter is a 76 y.o. female presenting on 10/28/2016 for Hyperlipidemia (Dr Sabra Heck pt here today for routine follow up of her chronic medical conditions and also c/o lower left leg "bulging")   HPI Hypertension Patient is currently on Losartan-hydrochlorothiazide and Coreg, and their blood pressure today is 107/72. Patient denies any lightheadedness or dizziness. Patient denies headaches, blurred vision, chest pains, shortness of breath, or weakness. Denies any side effects from medication and is content with current medication.   Hyperlipidemia Patient is coming in for recheck of his hyperlipidemia. The patient is currently taking omega-3. They deny any issues with myalgias or history of liver damage from it. They deny any focal numbness or weakness or chest pain.   Type 2 diabetes mellitus Patient comes in today for recheck of his diabetes. Patient has been currently taking glimepiride 1 mg. Patient is currently on an ACE inhibitor/ARB. Patient has seen an ophthalmologist this year. Patient denies any issues with their feet.   Hypothyroidism recheck Patient is coming in for thyroid recheck today as well. They deny any issues with hair changes or heat or cold problems or diarrhea or constipation. They deny any chest pain or palpitations. They are currently on levothyroxine 150 micrograms   Left leg lump Patient has a small nodule that has developed on the back of her left leg just below her calf muscle and above her ankle. She says that it is in the same place that she's had a previous 2 surgeries that were about 20 years ago. They said one of them could've been developed into a precancerous nodule but then she hasn't had any issues for until the past month.  In the past month this nodule is started developed over the back of her left leg. She says it's not painful or tender. There is no redness or warmth but just this hard lump on the back of her leg that moves easily.  Relevant past medical, surgical, family and social history reviewed and updated as indicated. Interim medical history since our last visit reviewed. Allergies and medications reviewed and updated.  Review of Systems  Constitutional: Negative for chills and fever.  HENT: Negative for congestion, ear discharge and ear pain.   Eyes: Negative for redness and visual disturbance.  Respiratory: Negative for chest tightness and shortness of breath.   Cardiovascular: Negative for chest pain and leg swelling.  Genitourinary: Negative for difficulty urinating and dysuria.  Musculoskeletal: Negative for back pain and gait problem.  Skin: Negative for color change and rash.  Neurological: Negative for dizziness, weakness, light-headedness, numbness and headaches.  Psychiatric/Behavioral: Negative for agitation and behavioral problems.  All other systems reviewed and are negative.   Per HPI unless specifically indicated above     Objective:    BP 107/72   Pulse 71   Temp 97.7 F (36.5 C) (Oral)   Ht 5' 4.5" (1.638 m)   Wt 250 lb (113.4 kg)   BMI 42.25 kg/m   Wt Readings from Last 3 Encounters:  10/28/16 250 lb (113.4 kg)  07/23/16 243 lb (110.2 kg)  07/16/16 249 lb 6.4 oz (113.1 kg)    Physical  Exam  Constitutional: She is oriented to person, place, and time. She appears well-developed and well-nourished. No distress.  Eyes: Conjunctivae are normal.  Neck: Neck supple. No thyromegaly present.  Cardiovascular: Normal rate, regular rhythm, normal heart sounds and intact distal pulses.   No murmur heard. Pulmonary/Chest: Effort normal and breath sounds normal. No respiratory distress. She has no wheezes. She has no rales.  Musculoskeletal: Normal range of motion. She exhibits  no edema or tenderness.       Right lower leg: She exhibits deformity. She exhibits no tenderness.       Legs: Lymphadenopathy:    She has no cervical adenopathy.  Neurological: She is alert and oriented to person, place, and time. Coordination normal.  Skin: Skin is warm and dry. No rash noted. She is not diaphoretic.  Psychiatric: She has a normal mood and affect. Her behavior is normal.  Nursing note and vitals reviewed.   Left tib-fib x-ray: Await final read from radiology    Assessment & Plan:   Problem List Items Addressed This Visit      Cardiovascular and Mediastinum   Essential hypertension     Endocrine   Hypothyroid   Relevant Orders   TSH   Diabetes mellitus without complication ([place]) - Primary   Relevant Orders   Bayer DCA Hb A1c Waived     Other   Hyperlipidemia   Vitamin D deficiency   Morbid obesity ([place])    Other Visit Diagnoses    Leg mass, left       Relevant Orders   DG Tibia/Fibula Left   History of anemia       Relevant Orders   CBC with Differential/Platelet      If x-ray shows nothing on the leg mass then may consider ultrasound or MRI.  Follow up plan: Return in about 3 months (around 01/28/2017), or if symptoms worsen or fail to improve, for Recheck diabetes.  Counseling provided for all of the vaccine components Orders Placed This Encounter  Procedures  . DG Tibia/Fibula Left  . Bayer DCA Hb A1c Waived  . TSH  . CBC with Differential/Platelet    Caryl Pina, MD [facility] 10/28/2016, 10:28 AM

## 2016-10-28 NOTE — Progress Notes (Unsigned)
X-ray showed soft tissue calcification recommended MRI, ordered MRI for patient Caryl Pina, MD [facility] 10/28/2016, 11:43 AM

## 2016-10-29 LAB — CBC WITH DIFFERENTIAL/PLATELET
Basophils Absolute: 0 10*3/uL (ref 0.0–0.2)
Basos: 0 %
EOS (ABSOLUTE): 0.2 10*3/uL (ref 0.0–0.4)
Eos: 3 %
Hematocrit: 39.6 % (ref 34.0–46.6)
Hemoglobin: 13.2 g/dL (ref 11.1–15.9)
Immature Grans (Abs): 0 10*3/uL (ref 0.0–0.1)
Immature Granulocytes: 0 %
Lymphocytes Absolute: 2.5 10*3/uL (ref 0.7–3.1)
Lymphs: 40 %
MCH: 30.6 pg (ref 26.6–33.0)
MCHC: 33.3 g/dL (ref 31.5–35.7)
MCV: 92 fL (ref 79–97)
Monocytes Absolute: 0.9 10*3/uL (ref 0.1–0.9)
Monocytes: 15 %
Neutrophils Absolute: 2.5 10*3/uL (ref 1.4–7.0)
Neutrophils: 42 %
Platelets: 264 10*3/uL (ref 150–379)
RBC: 4.32 x10E6/uL (ref 3.77–5.28)
RDW: 14.1 % (ref 12.3–15.4)
WBC: 6.2 10*3/uL (ref 3.4–10.8)

## 2016-10-29 LAB — TSH: TSH: 0.284 u[IU]/mL — ABNORMAL LOW (ref 0.450–4.500)

## 2016-10-29 MED ORDER — LEVOTHYROXINE SODIUM 150 MCG PO TABS: 150.0000 ug | ORAL_TABLET | Freq: Every day | ORAL | 1 refills | Status: DC

## 2016-10-29 NOTE — Addendum Note (Signed)
Addended by: Karle Plumber on: 10/29/2016 08:23 AM   Modules accepted: Orders

## 2016-11-18 ENCOUNTER — Ambulatory Visit (HOSPITAL_COMMUNITY)
Admission: RE | Admit: 2016-11-18 | Discharge: 2016-11-18 | Disposition: A | Discharge status: Home / Self Care | Payer: Medicare Other | Source: Ambulatory Visit | Attending: Family Medicine | Admitting: Family Medicine

## 2016-11-18 DIAGNOSIS — R2242 Localized swelling, mass and lump, left lower limb: Secondary | ICD-10-CM | POA: Insufficient documentation

## 2016-11-18 DIAGNOSIS — M7989 Other specified soft tissue disorders: Secondary | ICD-10-CM | POA: Diagnosis not present

## 2016-11-19 ENCOUNTER — Telehealth: Payer: Self-pay | Admitting: Family Medicine

## 2016-11-19 DIAGNOSIS — R2242 Localized swelling, mass and lump, left lower limb: Secondary | ICD-10-CM

## 2016-11-19 DIAGNOSIS — G4733 Obstructive sleep apnea (adult) (pediatric): Secondary | ICD-10-CM | POA: Diagnosis not present

## 2016-11-19 NOTE — Telephone Encounter (Signed)
Pt aware of results Referral placed to [facility]

## 2016-11-30 ENCOUNTER — Other Ambulatory Visit: Payer: Self-pay | Admitting: Family Medicine

## 2017-01-17 DIAGNOSIS — G8929 Other chronic pain: Secondary | ICD-10-CM | POA: Diagnosis not present

## 2017-01-17 DIAGNOSIS — M545 Low back pain: Secondary | ICD-10-CM | POA: Diagnosis not present

## 2017-01-17 DIAGNOSIS — M7662 Achilles tendinitis, left leg: Secondary | ICD-10-CM | POA: Diagnosis not present

## 2017-01-19 ENCOUNTER — Encounter: Payer: Self-pay | Admitting: Family Medicine

## 2017-01-19 ENCOUNTER — Ambulatory Visit (INDEPENDENT_AMBULATORY_CARE_PROVIDER_SITE_OTHER): Payer: Medicare Other

## 2017-01-19 ENCOUNTER — Ambulatory Visit (INDEPENDENT_AMBULATORY_CARE_PROVIDER_SITE_OTHER): Payer: Medicare Other | Admitting: Family Medicine

## 2017-01-19 VITALS — BP 129/75 | HR 56 | Temp 97.5°F | Ht 64.5 in | Wt 255.0 lb

## 2017-01-19 DIAGNOSIS — M545 Low back pain, unspecified: Secondary | ICD-10-CM

## 2017-01-19 DIAGNOSIS — R10A2 Flank pain, left side: Secondary | ICD-10-CM

## 2017-01-19 DIAGNOSIS — R109 Unspecified abdominal pain: Secondary | ICD-10-CM

## 2017-01-19 LAB — URINALYSIS
Bilirubin, UA: NEGATIVE
Glucose, UA: NEGATIVE
Ketones, UA: NEGATIVE
Nitrite, UA: NEGATIVE
Protein, UA: NEGATIVE
RBC, UA: NEGATIVE
Specific Gravity, UA: 1.01 (ref 1.005–1.030)
Urobilinogen, Ur: 0.2 mg/dL (ref 0.2–1.0)
pH, UA: 5.5 (ref 5.0–7.5)

## 2017-01-19 MED ORDER — TRAMADOL HCL 50 MG PO TABS: ORAL_TABLET | ORAL | 0 refills | Status: DC

## 2017-01-19 MED ORDER — BETAMETHASONE SOD PHOS & ACET 6 (3-3) MG/ML IJ SUSP
6.0000 mg | Freq: Once | INTRAMUSCULAR | Status: AC
  Administered 2017-01-19: 6 mg via INTRAMUSCULAR

## 2017-01-19 MED ORDER — DICLOFENAC SODIUM 75 MG PO TBEC: 75.0000 mg | DELAYED_RELEASE_TABLET | Freq: Two times a day (BID) | ORAL | 2 refills | Status: DC

## 2017-01-19 MED ORDER — CYCLOBENZAPRINE HCL 10 MG PO TABS: 10.0000 mg | ORAL_TABLET | Freq: Three times a day (TID) | ORAL | 1 refills | Status: DC | PRN

## 2017-01-19 NOTE — Progress Notes (Signed)
Chief Complaint  Patient presents with  . Back Pain    pt here today c/o left lower back pain that radiates to her left side    HPI  Patient presents today for back pain that isnear the left SI joint. It has been there for several days. Fairly sudden onset but no known injury. Intensity is 8/10. Pain is a dull ache. She denies dysuria. No frequency of urination.  PMH: Smoking status noted ROS: Per HPI  Objective: BP 129/75   Pulse (!) 56   Temp (!) 97.5 F (36.4 C) (Oral)   Ht 5' 4.5" (1.638 m)   Wt 255 lb (115.7 kg)   BMI 43.09 kg/m  Gen: NAD, alert, cooperative with exam HEENT: NCAT, EOMI, PERRL CV: RRR, good S1/S2, no murmur Resp: CTABL, no wheezes, non-labored Abd: SNTND, BS present, no guarding or organomegaly Ext: No edema, warm. Tender left SI region. 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 Acute left flank pain   2. Lumbar spine pain     Meds ordered this encounter  Medications  . betamethasone acetate-betamethasone sodium phosphate (CELESTONE) injection 6 mg  . diclofenac (VOLTAREN) 75 MG EC tablet    Sig: Take 1 tablet (75 mg total) by mouth 2 (two) times daily. For muscle and  Joint pain    Dispense:  60 tablet    Refill:  2  . traMADol (ULTRAM) 50 MG tablet    Sig: One or two four times a day as needed for pain    Dispense:  24 tablet    Refill:  0  . cyclobenzaprine (FLEXERIL) 10 MG tablet    Sig: Take 1 tablet (10 mg total) by mouth 3 (three) times daily as needed for muscle spasms.    Dispense:  90 tablet    Refill:  1    Orders Placed This Encounter  Procedures  . Urine Culture  . DG Lumbar Spine 2-3 Views    Standing Status:   Future    Number of Occurrences:   1    Standing Expiration Date:   03/21/2018    Order Specific Question:   Reason for Exam (SYMPTOM  OR DIAGNOSIS REQUIRED)    Answer:   back pain   Order Specific Question:   Preferred imaging location?    Answer:   Internal  . Urinalysis    Follow up as needed.  Claretta Fraise, MD

## 2017-01-20 ENCOUNTER — Other Ambulatory Visit: Payer: Self-pay | Admitting: Family Medicine

## 2017-01-20 LAB — URINE CULTURE

## 2017-02-03 ENCOUNTER — Ambulatory Visit (INDEPENDENT_AMBULATORY_CARE_PROVIDER_SITE_OTHER): Payer: Medicare Other | Admitting: Family Medicine

## 2017-02-03 ENCOUNTER — Ambulatory Visit: Payer: Medicare Other | Admitting: Family Medicine

## 2017-02-03 ENCOUNTER — Encounter: Payer: Self-pay | Admitting: Family Medicine

## 2017-02-03 VITALS — BP 133/74 | HR 67 | Temp 97.1°F | Ht 64.5 in | Wt 254.0 lb

## 2017-02-03 DIAGNOSIS — I10 Essential (primary) hypertension: Secondary | ICD-10-CM

## 2017-02-03 DIAGNOSIS — E039 Hypothyroidism, unspecified: Secondary | ICD-10-CM

## 2017-02-03 DIAGNOSIS — E7800 Pure hypercholesterolemia, unspecified: Secondary | ICD-10-CM | POA: Diagnosis not present

## 2017-02-03 DIAGNOSIS — E119 Type 2 diabetes mellitus without complications: Secondary | ICD-10-CM | POA: Diagnosis not present

## 2017-02-03 LAB — BAYER DCA HB A1C WAIVED: HB A1C (BAYER DCA - WAIVED): 6.6 % (ref <7.0)

## 2017-02-03 MED ORDER — CARVEDILOL 6.25 MG PO TABS: 6.2500 mg | ORAL_TABLET | Freq: Two times a day (BID) | ORAL | 1 refills | Status: DC

## 2017-02-03 MED ORDER — CLOBETASOL PROPIONATE 0.05 % EX OINT: 1.0000 "application " | TOPICAL_OINTMENT | Freq: Two times a day (BID) | CUTANEOUS | 3 refills | Status: DC

## 2017-02-03 NOTE — Progress Notes (Signed)
BP 133/74   Pulse 67   Temp (!) 97.1 F (36.2 C) (Oral)   Ht 5' 4.5" (1.638 m)   Wt 254 lb (115.2 kg)   BMI 42.93 kg/m    Subjective:    Patient ID: Karen Potter, female    DOB: [birth year], 76 y.o.   MRN: [id number]  HPI: Karen Potter is a 76 y.o. female presenting on 02/03/2017 for Hypothyroidism (follow up); Hyperlipidemia; Diabetes; Hypertension; and Refill clobetasol Propionate cream (uses sporadically)   HPI Hypothyroidism recheck Patient is coming in for thyroid recheck today as well. They deny any issues with hair changes or heat or cold problems or diarrhea or constipation. They deny any chest pain or palpitations. They are currently on levothyroxine 180mcrograms   Hyperlipidemia Patient is coming in for recheck of his hyperlipidemia. The patient is currently taking fish oil. They deny any issues with myalgias or history of liver damage from it. They deny any focal numbness or weakness or chest pain.  Hypertension Patient is currently on losartan-hydrochlorothiazide and carvedilol, and their blood pressure today is 133/74. Patient denies any lightheadedness or dizziness. Patient denies headaches, blurred vision, chest pains, shortness of breath, or weakness. Denies any side effects from medication and is content with current medication.  Type 2 diabetes mellitus Patient comes in today for recheck of his diabetes. Patient has been currently taking diet control. Patient is currently on an ACE inhibitor/ARB. Patient has seen an ophthalmologist this year. Patient denies any issues with their feet.   Relevant past medical, surgical, family and social history reviewed and updated as indicated. Interim medical history since our last visit reviewed. Allergies and medications reviewed and updated.  Review of Systems  Constitutional: Negative for chills and fever.  Eyes: Negative for visual disturbance.  Respiratory: Negative for chest tightness and shortness of breath.     Cardiovascular: Negative for chest pain and leg swelling.  Endocrine: Negative for cold intolerance and heat intolerance.  Genitourinary: Negative for difficulty urinating and dysuria.  Musculoskeletal: Negative for back pain and gait problem.  Skin: Negative for rash.  Neurological: Negative for dizziness, light-headedness and headaches.  Psychiatric/Behavioral: Negative for agitation and behavioral problems.  All other systems reviewed and are negative.   Per HPI unless specifically indicated above   Allergies as of 02/03/2017      Reactions   Codeine Itching   Dilaudid [hydromorphone] Other (See Comments)   Mouth blisters   Other Itching   Most pain meds cause itching.  When has to take pain medication, she has been instructed to take Benadryl   Doxycycline Rash      Medication List       Accurate as of 02/03/17 10:46 AM. Always use your most recent med list.          aspirin 325 MG tablet Take 325 mg by mouth every evening.   carvedilol 6.25 MG tablet Commonly known as:  COREG Take 1 tablet (6.25 mg total) by mouth 2 (two) times daily with a meal.   clobetasol ointment 0.05 % Commonly known as:  TEMOVATE Apply 1 application topically 2 (two) times daily.   clotrimazole-betamethasone cream Commonly known as:  LOTRISONE APPLY TOPICALLY TWICE DAILY   cyclobenzaprine 10 MG tablet Commonly known as:  FLEXERIL Take 1 tablet (10 mg total) by mouth 3 (three) times daily as needed for muscle spasms.   diclofenac 75 MG EC tablet Commonly known as:  VOLTAREN Take 1 tablet (75 mg total) by mouth 2 (  two) times daily. For muscle and  Joint pain   FISH OIL TRIPLE STRENGTH 1400 MG Caps Take 1,400 mg by mouth daily.   glimepiride 2 MG tablet Commonly known as:  AMARYL TAKE ONE TABLET BY MOUTH ONCE DAILY BEFORE BREAKFAST   glucose blood test strip Test BS 1 time a day   guaiFENesin 600 MG 12 hr tablet Commonly known as:  MUCINEX Take 600 mg by mouth 2 (two) times  daily as needed for cough or to loosen phlegm. Reported on 10/16/2015   levothyroxine 150 MCG tablet Commonly known as:  SYNTHROID, LEVOTHROID Take 1 tablet (150 mcg total) by mouth daily.   losartan-hydrochlorothiazide 100-25 MG tablet Commonly known as:  HYZAAR TAKE ONE TABLET BY MOUTH ONCE DAILY   omeprazole 20 MG capsule Commonly known as:  PRILOSEC Take 1 capsule (20 mg total) by mouth 2 (two) times daily before a meal.   ONE TOUCH LANCETS Misc Patient to check BS once daily   traMADol 50 MG tablet Commonly known as:  ULTRAM One or two four times a day as needed for pain   Vitamin D3 2000 units capsule Take 2,000 Units by mouth daily.            Discharge Care Instructions        Start     Ordered   02/03/17 0000  clobetasol ointment (TEMOVATE) 0.05 %  2 times daily     02/03/17 1036   02/03/17 0000  carvedilol (COREG) 6.25 MG tablet  2 times daily with meals    Comments:  Please consider 90 day supplies to promote better adherence   02/03/17 1036   02/03/17 0000  Lipid panel     02/03/17 1036   02/03/17 0000  CMP14+EGFR     02/03/17 1036   02/03/17 0000  Bayer DCA Hb A1c Waived     02/03/17 1036   02/03/17 0000  TSH     02/03/17 1036         Objective:    BP 133/74   Pulse 67   Temp (!) 97.1 F (36.2 C) (Oral)   Ht 5' 4.5" (1.638 m)   Wt 254 lb (115.2 kg)   BMI 42.93 kg/m   Wt Readings from Last 3 Encounters:  02/03/17 254 lb (115.2 kg)  01/19/17 255 lb (115.7 kg)  10/28/16 250 lb (113.4 kg)    Physical Exam  Constitutional: She is oriented to person, place, and time. She appears well-developed and well-nourished. No distress.  Eyes: Conjunctivae are normal.  Neck: Neck supple. No thyromegaly present.  Cardiovascular: Normal rate, regular rhythm, normal heart sounds and intact distal pulses.   No murmur heard. Pulmonary/Chest: Effort normal and breath sounds normal. No respiratory distress. She has no wheezes. She has no rales.    Musculoskeletal: Normal range of motion. She exhibits no edema or tenderness.  Lymphadenopathy:    She has no cervical adenopathy.  Neurological: She is alert and oriented to person, place, and time. Coordination normal.  Skin: Skin is warm and dry. No rash noted. She is not diaphoretic.  Psychiatric: She has a normal mood and affect. Her behavior is normal.  Nursing note and vitals reviewed.   Results for orders placed or performed in visit on 01/19/17  Urine Culture  Result Value Ref Range   Urine Culture, Routine Final report    Organism ID, Bacteria Comment   Urinalysis  Result Value Ref Range   Specific Gravity, UA 1.010 1.005 - 1.030  pH, UA 5.5 5.0 - 7.5   Color, UA Yellow Yellow   Appearance Ur Clear Clear   Leukocytes, UA Trace (A) Negative   Protein, UA Negative Negative/Trace   Glucose, UA Negative Negative   Ketones, UA Negative Negative   RBC, UA Negative Negative   Bilirubin, UA Negative Negative   Urobilinogen, Ur 0.2 0.2 - 1.0 mg/dL   Nitrite, UA Negative Negative      Assessment & Plan:   Problem List Items Addressed This Visit      Cardiovascular and Mediastinum   Essential hypertension   Relevant Medications   carvedilol (COREG) 6.25 MG tablet   Other Relevant Orders   CMP14+EGFR     Endocrine   Hypothyroid - Primary   Relevant Medications   carvedilol (COREG) 6.25 MG tablet   Other Relevant Orders   TSH   Diabetes mellitus without complication (HCC)   Relevant Orders   CMP14+EGFR   Bayer DCA Hb A1c Waived     Other   Hyperlipidemia   Relevant Medications   carvedilol (COREG) 6.25 MG tablet   Other Relevant Orders   Lipid panel   Morbid obesity ([place])   Relevant Orders   Lipid panel       Follow up plan: Return in about 3 months (around 05/05/2017), or if symptoms worsen or fail to improve, for Thyroid and diabetes.  Counseling provided for all of the vaccine components Orders Placed This Encounter  Procedures  . Lipid panel   . CMP14+EGFR  . Bayer DCA Hb A1c Waived  . TSH    Caryl Pina, MD [facility] 02/03/2017, 10:46 AM

## 2017-02-04 LAB — CMP14+EGFR
ALT: 27 IU/L (ref 0–32)
AST: 17 IU/L (ref 0–40)
Albumin/Globulin Ratio: 1.2 (ref 1.2–2.2)
Albumin: 3.9 g/dL (ref 3.5–4.8)
Alkaline Phosphatase: 74 IU/L (ref 39–117)
BUN/Creatinine Ratio: 27 (ref 12–28)
BUN: 23 mg/dL (ref 8–27)
Bilirubin Total: 0.4 mg/dL (ref 0.0–1.2)
CO2: 26 mmol/L (ref 20–29)
Calcium: 9.1 mg/dL (ref 8.7–10.3)
Chloride: 103 mmol/L (ref 96–106)
Creatinine, Ser: 0.85 mg/dL (ref 0.57–1.00)
GFR calc Af Amer: 78 mL/min/{1.73_m2} (ref >59)
GFR calc non Af Amer: 67 mL/min/{1.73_m2} (ref >59)
Globulin, Total: 3.3 g/dL (ref 1.5–4.5)
Glucose: 111 mg/dL — ABNORMAL HIGH (ref 65–99)
Potassium: 4.8 mmol/L (ref 3.5–5.2)
Sodium: 143 mmol/L (ref 134–144)
Total Protein: 7.2 g/dL (ref 6.0–8.5)

## 2017-02-04 LAB — LIPID PANEL
Chol/HDL Ratio: 4 ratio (ref 0.0–4.4)
Cholesterol, Total: 139 mg/dL (ref 100–199)
HDL: 35 mg/dL — ABNORMAL LOW (ref >39)
LDL Calculated: 61 mg/dL (ref 0–99)
Triglycerides: 214 mg/dL — ABNORMAL HIGH (ref 0–149)
VLDL Cholesterol Cal: 43 mg/dL — ABNORMAL HIGH (ref 5–40)

## 2017-02-04 LAB — TSH: TSH: 1.85 u[IU]/mL (ref 0.450–4.500)

## 2017-04-06 ENCOUNTER — Other Ambulatory Visit: Payer: Self-pay | Admitting: Family Medicine

## 2017-05-06 ENCOUNTER — Ambulatory Visit (INDEPENDENT_AMBULATORY_CARE_PROVIDER_SITE_OTHER): Payer: Medicare Other | Admitting: Family Medicine

## 2017-05-06 ENCOUNTER — Ambulatory Visit (INDEPENDENT_AMBULATORY_CARE_PROVIDER_SITE_OTHER): Payer: Medicare Other

## 2017-05-06 ENCOUNTER — Encounter: Payer: Self-pay | Admitting: Family Medicine

## 2017-05-06 VITALS — BP 138/82 | HR 66 | Temp 97.6°F | Ht 64.5 in | Wt 252.0 lb

## 2017-05-06 DIAGNOSIS — E1159 Type 2 diabetes mellitus with other circulatory complications: Secondary | ICD-10-CM | POA: Diagnosis not present

## 2017-05-06 DIAGNOSIS — K219 Gastro-esophageal reflux disease without esophagitis: Secondary | ICD-10-CM

## 2017-05-06 DIAGNOSIS — I10 Essential (primary) hypertension: Secondary | ICD-10-CM

## 2017-05-06 DIAGNOSIS — R0602 Shortness of breath: Secondary | ICD-10-CM

## 2017-05-06 DIAGNOSIS — I152 Hypertension secondary to endocrine disorders: Secondary | ICD-10-CM

## 2017-05-06 DIAGNOSIS — E119 Type 2 diabetes mellitus without complications: Secondary | ICD-10-CM | POA: Diagnosis not present

## 2017-05-06 DIAGNOSIS — E1169 Type 2 diabetes mellitus with other specified complication: Secondary | ICD-10-CM | POA: Diagnosis not present

## 2017-05-06 DIAGNOSIS — E039 Hypothyroidism, unspecified: Secondary | ICD-10-CM | POA: Diagnosis not present

## 2017-05-06 DIAGNOSIS — E785 Hyperlipidemia, unspecified: Secondary | ICD-10-CM

## 2017-05-06 LAB — BAYER DCA HB A1C WAIVED: HB A1C (BAYER DCA - WAIVED): 6.3 % (ref <7.0)

## 2017-05-06 MED ORDER — ALBUTEROL SULFATE HFA 108 (90 BASE) MCG/ACT IN AERS: 2.0000 | INHALATION_SPRAY | Freq: Four times a day (QID) | RESPIRATORY_TRACT | 0 refills | Status: DC | PRN

## 2017-05-06 MED ORDER — PREDNISONE 20 MG PO TABS: ORAL_TABLET | ORAL | 0 refills | Status: DC

## 2017-05-06 NOTE — Progress Notes (Signed)
BP 138/82   Pulse 66   Temp 97.6 F (36.4 C) (Oral)   Ht 5' 4.5" (1.638 m)   Wt 252 lb (114.3 kg)   SpO2 97%   BMI 42.59 kg/m    Subjective:    Patient ID: Karen Potter, female    DOB: 1940-07-08, 76 y.o.   MRN: [id number]  HPI: Karen Potter is a 76 y.o. female presenting on 05/06/2017 for Hypothyroidism (3 mo); Hyperlipidemia; Diabetes; Hypertension; and Shortness of breath, episodes of hands and feet feeling cold (feels like she gets short of breath with activity)   HPI Hypothyroidism recheck Patient is coming in for thyroid recheck today as well. They deny any issues with hair changes or heat or cold problems or diarrhea or constipation. They deny any chest pain or palpitations. They are currently on levothyroxine 116mcrograms   GERD Patient is currently on Prilosec.  She denies any major symptoms or abdominal pain or belching or burping. She denies any blood in her stool or lightheadedness or dizziness.   Type 2 diabetes mellitus Patient comes in today for recheck of his diabetes. Patient has been currently taking no medication and we are monitoring for now on diet. Patient is currently on an ACE inhibitor/ARB. Patient has not seen an ophthalmologist this year. Patient denies any issues with their feet.   Hyperlipidemia Patient is coming in for recheck of his hyperlipidemia. The patient is currently taking fish oil. They deny any issues with myalgias or history of liver damage from it. They deny any focal numbness or weakness or chest pain.   Hypertension Patient is currently on carvedilol and losartan-hydrochlorothiazide, and their blood pressure today is 138/82. Patient denies any lightheadedness or dizziness. Patient denies headaches, blurred vision, chest pains, or weakness. Denies any side effects from medication and is content with current medication.   Shortness of breath on exertion Patient comes in complaining of feeling increasingly short of breath on  exertion even with short distances and smaller activities.  She also complains of even carrying her pocketbook which is heavy will make her increasingly short of breath.  She denies any shortness of breath or wheezing at rest.  She says she has been having some coughing but denies any fevers or chills or congestion or sinus pressure or drainage.  Patient denies any chest pain or swelling her legs.  Patient does not appear to be up and weight is actually down 3 pounds since last time we saw her 3 months ago.  Shortness of breath is been going on for at least a few weeks and maybe even a month and a half.  Relevant past medical, surgical, family and social history reviewed and updated as indicated. Interim medical history since our last visit reviewed. Allergies and medications reviewed and updated.  Review of Systems  Constitutional: Negative for chills and fever.  HENT: Negative for congestion, ear discharge, ear pain, sinus pressure, sinus pain, sneezing and sore throat.   Eyes: Negative for visual disturbance.  Respiratory: Positive for cough and shortness of breath. Negative for chest tightness and wheezing.   Cardiovascular: Negative for chest pain and leg swelling.  Musculoskeletal: Negative for back pain and gait problem.  Skin: Negative for rash.  Neurological: Negative for light-headedness and headaches.  Psychiatric/Behavioral: Negative for agitation and behavioral problems.  All other systems reviewed and are negative.   Per HPI unless specifically indicated above        Objective:    BP 138/82   Pulse  66   Temp 97.6 F (36.4 C) (Oral)   Ht 5' 4.5" (1.638 m)   Wt 252 lb (114.3 kg)   SpO2 97%   BMI 42.59 kg/m   Wt Readings from Last 3 Encounters:  05/06/17 252 lb (114.3 kg)  02/03/17 254 lb (115.2 kg)  01/19/17 255 lb (115.7 kg)    Physical Exam  Constitutional: She is oriented to person, place, and time. She appears well-developed and well-nourished. No distress.    HENT:  Right Ear: External ear normal.  Left Ear: External ear normal.  Mouth/Throat: Oropharynx is clear and moist. No oropharyngeal exudate.  Eyes: Conjunctivae are normal.  Neck: Neck supple. No thyromegaly present.  Cardiovascular: Normal rate, regular rhythm, normal heart sounds and intact distal pulses.  No murmur heard. Pulmonary/Chest: Effort normal. No respiratory distress. She has no wheezes. She has rales in the right lower field and the left lower field.  Musculoskeletal: Normal range of motion. She exhibits no edema.  Lymphadenopathy:    She has no cervical adenopathy.  Neurological: She is alert and oriented to person, place, and time. Coordination normal.  Skin: Skin is warm and dry. No rash noted. She is not diaphoretic.  Psychiatric: She has a normal mood and affect. Her behavior is normal.  Nursing note and vitals reviewed.  Chest x-ray: Morbidly obese female with hypoventilated lungs.  No acute cardiopulmonary abnormality.  Await final read from radiologist    Assessment & Plan:   Problem List Items Addressed This Visit      Cardiovascular and Mediastinum   Hypertension associated with diabetes ([place])   Relevant Orders   CMP14+EGFR     Digestive   GERD (gastroesophageal reflux disease) (Chronic)     Endocrine   Hyperlipidemia associated with type 2 diabetes mellitus ([place])   Relevant Orders   Lipid panel   Hypothyroid - Primary   Relevant Orders   TSH   Diabetes mellitus without complication ([place])   Relevant Orders   Bayer DCA Hb A1c Waived   CMP14+EGFR    Other Visit Diagnoses    Shortness of breath on exertion       Relevant Medications   predniSONE (DELTASONE) 20 MG tablet   albuterol (PROVENTIL HFA;VENTOLIN HFA) 108 (90 Base) MCG/ACT inhaler   Other Relevant Orders   DG Chest 2 View       Follow up plan: Return in about 3 months (around 08/04/2017), or if symptoms worsen or fail to improve, for Diabetes and hypertension  recheck.  Counseling provided for all of the vaccine components Orders Placed This Encounter  Procedures  . DG Chest 2 View  . TSH  . Bayer DCA Hb A1c Waived  . CMP14+EGFR  . Lipid panel    Caryl Pina, MD [facility] 05/06/2017, 11:13 AM

## 2017-05-07 LAB — CMP14+EGFR
ALT: 35 IU/L — ABNORMAL HIGH (ref 0–32)
AST: 25 IU/L (ref 0–40)
Albumin/Globulin Ratio: 1.4 (ref 1.2–2.2)
Albumin: 4.2 g/dL (ref 3.5–4.8)
Alkaline Phosphatase: 72 IU/L (ref 39–117)
BUN/Creatinine Ratio: 18 (ref 12–28)
BUN: 17 mg/dL (ref 8–27)
Bilirubin Total: 0.5 mg/dL (ref 0.0–1.2)
CO2: 23 mmol/L (ref 20–29)
Calcium: 9.8 mg/dL (ref 8.7–10.3)
Chloride: 99 mmol/L (ref 96–106)
Creatinine, Ser: 0.92 mg/dL (ref 0.57–1.00)
GFR calc Af Amer: 70 mL/min/{1.73_m2} (ref >59)
GFR calc non Af Amer: 61 mL/min/{1.73_m2} (ref >59)
Globulin, Total: 3.1 g/dL (ref 1.5–4.5)
Glucose: 133 mg/dL — ABNORMAL HIGH (ref 65–99)
Potassium: 4.2 mmol/L (ref 3.5–5.2)
Sodium: 141 mmol/L (ref 134–144)
Total Protein: 7.3 g/dL (ref 6.0–8.5)

## 2017-05-07 LAB — LIPID PANEL
Chol/HDL Ratio: 4.9 ratio — ABNORMAL HIGH (ref 0.0–4.4)
Cholesterol, Total: 151 mg/dL (ref 100–199)
HDL: 31 mg/dL — ABNORMAL LOW (ref >39)
LDL Calculated: 59 mg/dL (ref 0–99)
Triglycerides: 305 mg/dL — ABNORMAL HIGH (ref 0–149)
VLDL Cholesterol Cal: 61 mg/dL — ABNORMAL HIGH (ref 5–40)

## 2017-05-07 LAB — TSH: TSH: 1.9 u[IU]/mL (ref 0.450–4.500)

## 2017-05-11 ENCOUNTER — Other Ambulatory Visit: Payer: Self-pay

## 2017-05-11 MED ORDER — ICOSAPENT ETHYL 1 G PO CAPS: 2.0000 g | ORAL_CAPSULE | Freq: Two times a day (BID) | ORAL | 2 refills | Status: DC

## 2017-05-17 ENCOUNTER — Other Ambulatory Visit: Payer: Self-pay | Admitting: Family Medicine

## 2017-05-19 ENCOUNTER — Other Ambulatory Visit: Payer: Self-pay | Admitting: Family Medicine

## 2017-05-19 DIAGNOSIS — Z1231 Encounter for screening mammogram for malignant neoplasm of breast: Secondary | ICD-10-CM

## 2017-06-06 ENCOUNTER — Ambulatory Visit
Admission: RE | Admit: 2017-06-06 | Discharge: 2017-06-06 | Disposition: A | Discharge status: Home / Self Care | Payer: Medicare Other | Source: Ambulatory Visit | Attending: Family Medicine | Admitting: Family Medicine

## 2017-06-06 DIAGNOSIS — Z1231 Encounter for screening mammogram for malignant neoplasm of breast: Secondary | ICD-10-CM

## 2017-06-12 ENCOUNTER — Other Ambulatory Visit: Payer: Self-pay | Admitting: Family Medicine

## 2017-06-27 DIAGNOSIS — L237 Allergic contact dermatitis due to plants, except food: Secondary | ICD-10-CM | POA: Diagnosis not present

## 2017-07-05 ENCOUNTER — Ambulatory Visit (INDEPENDENT_AMBULATORY_CARE_PROVIDER_SITE_OTHER): Payer: Medicare Other | Admitting: Family Medicine

## 2017-07-05 ENCOUNTER — Encounter: Payer: Self-pay | Admitting: Family Medicine

## 2017-07-05 VITALS — BP 107/75 | HR 75 | Temp 99.4°F | Ht 64.5 in | Wt 253.0 lb

## 2017-07-05 DIAGNOSIS — R3129 Other microscopic hematuria: Secondary | ICD-10-CM | POA: Diagnosis not present

## 2017-07-05 DIAGNOSIS — R300 Dysuria: Secondary | ICD-10-CM

## 2017-07-05 DIAGNOSIS — N3001 Acute cystitis with hematuria: Secondary | ICD-10-CM

## 2017-07-05 LAB — URINALYSIS
Bilirubin, UA: NEGATIVE
Glucose, UA: NEGATIVE
Ketones, UA: NEGATIVE
Nitrite, UA: NEGATIVE
Protein, UA: NEGATIVE
Specific Gravity, UA: 1.02 (ref 1.005–1.030)
Urobilinogen, Ur: 0.2 mg/dL (ref 0.2–1.0)
pH, UA: 6 (ref 5.0–7.5)

## 2017-07-05 MED ORDER — SULFAMETHOXAZOLE-TRIMETHOPRIM 800-160 MG PO TABS: 1.0000 | ORAL_TABLET | Freq: Two times a day (BID) | ORAL | 0 refills | Status: DC

## 2017-07-05 NOTE — Progress Notes (Signed)
Subjective:  Patient ID: Karen Potter, female    DOB: 06/29/1940  Age: 77 y.o. MRN: [id number]  CC: Dysuria (pt here today c/o dysuria and notices blood when wiping after urinating and loose stools)   HPI Karen Potter presents for onset 2 days ago of dysuria and suprapubic pain.  She is noted some blood on the toilet paper when she wipes.  This morning she had some fecal retention.  She sat on the commode for about 15 minutes and nothing happened.  However, she went back a couple of hours later and had profuse return with some watery component noted at the time.  She has had no fever chills or sweats.  No flank pain or nausea.  Depression screen [facility] 2/9 05/06/2017 02/03/2017 10/28/2016  Decreased Interest 0 0 0  Down, Depressed, Hopeless 0 0 0  PHQ - 2 Score 0 0 0  Altered sleeping - - -  Tired, decreased energy - - -  Change in appetite - - -  Feeling bad or failure about yourself  - - -  Trouble concentrating - - -  Moving slowly or fidgety/restless - - -  Suicidal thoughts - - -  PHQ-9 Score - - -  Difficult doing work/chores - - -    History Karen Potter has a past medical history of COPD, mild ([place]), Duodenal ulcer without hemorrhage or perforation, Dyspnea on exertion, Fatty liver, Gastric ulcer without hemorrhage or perforation, Gastritis, GERD (gastroesophageal reflux disease), Hearing loss, History of benign parathyroid tumor, 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   She has a past surgical history that includes Carpal tunnel release (Right, 05-10-2007); Parathyroidectomy (N/A, 05/06/2015);  Esophagogastroduodenoscopy (N/A, 08/29/2015); Ovarian cyst surgery (Right, 1966); Total abdominal hysterectomy w/ bilateral salpingoophorectomy (1984); Total knee arthroplasty (Right, 04-08-2008); Knee arthroscopy (Right, 1999); Lumbar spine surgery (09-01-2010); transthoracic echocardiogram (04-07-2012); Tonsillectomy (as child); Cholecystectomy open (1985); LEFT HEEL REPAIR SURGERY (1954;  1985;  1995); CYSTO/  RIGHT URETEROSCOPIC STONE EXTRACTON/  STENT PLACEMENT (06/ 2016   in [place]); Cystoscopy/retrograde/ureteroscopy/stone extraction with basket (Right, 11/17/2015); and Holmium laser application (Right, 2/77/4128).   Her family history includes Cancer in her brother and father; Heart failure in her mother.She reports that  has never smoked. she has never used smokeless tobacco. She reports that she does not drink alcohol or use drugs.    ROS Review of Systems  Constitutional: Negative for chills, diaphoresis and fever.  HENT: Negative for congestion.   Eyes: Negative for visual disturbance.  Respiratory: Negative for cough and shortness of breath.   Cardiovascular: Negative for chest pain and palpitations.  Gastrointestinal: Positive for abdominal pain and diarrhea. Negative for constipation and nausea.  Genitourinary: Positive for dysuria, frequency, hematuria and urgency. Negative for decreased urine volume, flank pain, menstrual problem, pelvic pain, vaginal discharge and vaginal pain.  Musculoskeletal: Negative for arthralgias and joint swelling.  Skin: Negative for rash.  Neurological: Negative for dizziness and numbness.    Objective:  BP 107/75   Pulse 75   Temp 99.4 F (37.4 C) (Oral)   Ht 5' 4.5" (1.638 m)   Wt 253 lb (114.8 kg)   BMI 42.76 kg/m   BP Readings from Last 3 Encounters:  07/05/17 107/75  05/06/17 138/82  02/03/17 133/74    Wt Readings from Last 3 Encounters:  07/05/17 253 lb (114.8 kg)  05/06/17 252 lb (114.3 kg)  02/03/17 254 lb (115.2 kg)      Physical Exam  Constitutional: She is oriented to person, place, and time. She appears well-developed and well-nourished.  HENT:  Head: Normocephalic and atraumatic.  Cardiovascular: Normal rate and regular rhythm.  No murmur heard. Pulmonary/Chest: Effort normal and breath sounds normal.  Abdominal: Soft. Bowel sounds are normal. She exhibits no mass. There is no tenderness. There is no rebound and no guarding.  Musculoskeletal: She exhibits no tenderness.  Neurological: She is alert and oriented to person, place, and time.  Skin: Skin is warm and dry.  Psychiatric: She has a normal mood and affect. Her behavior is normal.      Assessment & Plan:   Karen Potter was seen today for dysuria.  Diagnoses and all orders for this visit:  Other microscopic hematuria  Dysuria -     Urinalysis -     Urine Culture  Acute cystitis with hematuria  Other orders -     sulfamethoxazole-trimethoprim (BACTRIM DS,SEPTRA DS) 800-160 MG tablet; Take 1 tablet by mouth 2 (two) times daily.       I have discontinued Karen Potter predniSONE and Icosapent Ethyl. I am also having her start on sulfamethoxazole-trimethoprim. Additionally, I am having her maintain her Vitamin D3, glucose blood, ONE TOUCH LANCETS, FISH OIL TRIPLE STRENGTH, omeprazole, aspirin, clobetasol ointment, carvedilol, clotrimazole-betamethasone, Magnesium, loratadine-pseudoephedrine, albuterol, levothyroxine, and losartan-hydrochlorothiazide.  Allergies as of 07/05/2017      Reactions   Codeine Itching   Dilaudid [hydromorphone] Other (See Comments)   Mouth blisters   Other Itching   Most pain meds cause itching.  When has to take pain medication, she has been instructed to take Benadryl   Doxycycline Rash      Medication List        Accurate as of 07/05/17  4:48 PM. Always use your most recent med list.          albuterol 108 (90 Base) MCG/ACT inhaler Commonly known as:  PROVENTIL HFA;VENTOLIN HFA Inhale  2 puffs into the lungs every 6 (six) hours as needed for wheezing or shortness of breath.   aspirin 325 MG tablet Take 325 mg by mouth every evening.   carvedilol 6.25 MG tablet Commonly known as:  COREG Take 1 tablet (6.25 mg total) by mouth 2 (two) times daily with a meal.   clobetasol ointment 0.05 % Commonly known as:  TEMOVATE Apply 1 application topically 2 (two) times daily.   clotrimazole-betamethasone cream Commonly known as:  LOTRISONE APPLY  CREAM TOPICALLY TWICE DAILY   FISH OIL TRIPLE STRENGTH 1400 MG Caps Take 1,400 mg by mouth 3 (three) times daily.   glucose blood test strip Test BS 1 time a day   levothyroxine 150 MCG tablet Commonly known as:  SYNTHROID, LEVOTHROID TAKE 1 TABLET BY MOUTH ONCE DAILY   loratadine-pseudoephedrine 10-240 MG 24 hr tablet Commonly known as:  CLARITIN-D 24-hour Take 1 tablet by mouth daily.   losartan-hydrochlorothiazide 100-25 MG tablet Commonly known as:  HYZAAR TAKE 1 TABLET BY MOUTH ONCE DAILY   Magnesium 500 MG Tabs Take 500 mg by mouth daily.   omeprazole 20 MG capsule Commonly known as:  PRILOSEC Take 1 capsule (20 mg total) by mouth 2 (two) times daily before a meal.   ONE TOUCH LANCETS Misc Patient to check BS  once daily   sulfamethoxazole-trimethoprim 800-160 MG tablet Commonly known as:  BACTRIM DS,SEPTRA DS Take 1 tablet by mouth 2 (two) times daily.   Vitamin D3 2000 units capsule Take 2,000 Units by mouth daily.        Follow-up: Return if symptoms worsen or fail to improve.  Claretta Fraise, M.D.

## 2017-07-06 LAB — URINE CULTURE

## 2017-08-04 ENCOUNTER — Ambulatory Visit (INDEPENDENT_AMBULATORY_CARE_PROVIDER_SITE_OTHER): Payer: Medicare Other | Admitting: Family Medicine

## 2017-08-04 ENCOUNTER — Encounter: Payer: Self-pay | Admitting: Family Medicine

## 2017-08-04 DIAGNOSIS — E1159 Type 2 diabetes mellitus with other circulatory complications: Secondary | ICD-10-CM | POA: Diagnosis not present

## 2017-08-04 DIAGNOSIS — E559 Vitamin D deficiency, unspecified: Secondary | ICD-10-CM | POA: Diagnosis not present

## 2017-08-04 DIAGNOSIS — E1169 Type 2 diabetes mellitus with other specified complication: Secondary | ICD-10-CM | POA: Diagnosis not present

## 2017-08-04 DIAGNOSIS — E210 Primary hyperparathyroidism: Secondary | ICD-10-CM | POA: Diagnosis not present

## 2017-08-04 DIAGNOSIS — T466X5A Adverse effect of antihyperlipidemic and antiarteriosclerotic drugs, initial encounter: Secondary | ICD-10-CM | POA: Diagnosis not present

## 2017-08-04 DIAGNOSIS — K219 Gastro-esophageal reflux disease without esophagitis: Secondary | ICD-10-CM | POA: Diagnosis not present

## 2017-08-04 DIAGNOSIS — I10 Essential (primary) hypertension: Secondary | ICD-10-CM | POA: Diagnosis not present

## 2017-08-04 DIAGNOSIS — M609 Myositis, unspecified: Secondary | ICD-10-CM | POA: Diagnosis not present

## 2017-08-04 DIAGNOSIS — E039 Hypothyroidism, unspecified: Secondary | ICD-10-CM | POA: Diagnosis not present

## 2017-08-04 DIAGNOSIS — I152 Hypertension secondary to endocrine disorders: Secondary | ICD-10-CM

## 2017-08-04 DIAGNOSIS — E785 Hyperlipidemia, unspecified: Secondary | ICD-10-CM | POA: Diagnosis not present

## 2017-08-04 LAB — BAYER DCA HB A1C WAIVED: HB A1C (BAYER DCA - WAIVED): 7.1 % — ABNORMAL HIGH (ref <7.0)

## 2017-08-04 NOTE — Progress Notes (Signed)
BP 135/81   Pulse 70   Temp (!) 97.5 F (36.4 C) (Oral)   Ht 5' 4.5" (1.638 m)   Wt 258 lb (117 kg)   BMI 43.60 kg/m    Subjective:    Patient ID: Karen Potter, female    DOB: Apr 08, 1941, 77 y.o.   MRN: [id number]  HPI: Karen Potter is a 77 y.o. female presenting on 08/04/2017 for Hypothyroidism (3 mo); Diabetes (recently noticing cold feet and hands, numbness in 1st and 2nd digit of feet); Hyperlipidemia; Hypertension; and Excessive gas   HPI Hypothyroidism recheck Patient is coming in for thyroid recheck today as well. They deny any issues with hair changes or heat or cold problems or diarrhea or constipation. They deny any chest pain or palpitations. They are currently on levothyroxine 150 micrograms   Hypertension Patient is currently on Coreg and losartan-hydrochlorothiazide, and their blood pressure today is 135/81. Patient denies any lightheadedness or dizziness. Patient denies headaches, blurred vision, chest pains, shortness of breath, or weakness. Denies any side effects from medication and is content with current medication.   Hyperlipidemia Patient is coming in for recheck of his hyperlipidemia. The patient is currently taking omega 3 the patient has been intolerant of statins previously. They deny any issues with myalgias or history of liver damage from it. They deny any focal numbness or weakness or chest pain.   Type 2 diabetes mellitus Patient comes in today for recheck of his diabetes. Patient has been currently taking no medication and is on diet control for now. Patient is currently on an ACE inhibitor/ARB. Patient has not seen an ophthalmologist this year. Patient denies any issues with their feet.  Has some early numbness on first and second toes of both feet  GERD Patient is currently on omeprazole.  She denies any major symptoms or abdominal pain or belching or burping. She denies any blood in her stool or lightheadedness or dizziness.   Vitamin D and  hyperparathyroidism, blood check.  Relevant past medical, surgical, family and social history reviewed and updated as indicated. Interim medical history since our last visit reviewed. Allergies and medications reviewed and updated.  Review of Systems  Constitutional: Negative for chills and fever.  HENT: Negative for congestion, ear discharge and ear pain.   Eyes: Negative for redness and visual disturbance.  Respiratory: Negative for chest tightness and shortness of breath.   Cardiovascular: Negative for chest pain and leg swelling.  Gastrointestinal: Negative for abdominal pain.  Musculoskeletal: Negative for back pain and gait problem.  Skin: Negative for rash.  Neurological: Negative for light-headedness and headaches.  Psychiatric/Behavioral: Negative for agitation and behavioral problems.  All other systems reviewed and are negative.   Per HPI unless specifically indicated above   Allergies as of 08/04/2017      Reactions   Codeine Itching   Dilaudid [hydromorphone] Other (See Comments)   Mouth blisters   Other Itching   Most pain meds cause itching.  When has to take pain medication, she has been instructed to take Benadryl   Doxycycline Rash      Medication List        Accurate as of 08/04/17 11:28 AM. Always use your most recent med list.          albuterol 108 (90 Base) MCG/ACT inhaler Commonly known as:  PROVENTIL HFA;VENTOLIN HFA Inhale 2 puffs into the lungs every 6 (six) hours as needed for wheezing or shortness of breath.   aspirin 325 MG tablet  Take 325 mg by mouth every evening.   carvedilol 6.25 MG tablet Commonly known as:  COREG Take 1 tablet (6.25 mg total) by mouth 2 (two) times daily with a meal.   clobetasol ointment 0.05 % Commonly known as:  TEMOVATE Apply 1 application topically 2 (two) times daily.   clotrimazole-betamethasone cream Commonly known as:  LOTRISONE APPLY  CREAM TOPICALLY TWICE DAILY   FISH OIL TRIPLE STRENGTH 1400 MG  Caps Take 1,400 mg by mouth 3 (three) times daily.   glucose blood test strip Test BS 1 time a day   levothyroxine 150 MCG tablet Commonly known as:  SYNTHROID, LEVOTHROID TAKE 1 TABLET BY MOUTH ONCE DAILY   losartan-hydrochlorothiazide 100-25 MG tablet Commonly known as:  HYZAAR TAKE 1 TABLET BY MOUTH ONCE DAILY   Magnesium 500 MG Tabs Take 500 mg by mouth daily.   omeprazole 20 MG capsule Commonly known as:  PRILOSEC Take 1 capsule (20 mg total) by mouth 2 (two) times daily before a meal.   ONE TOUCH LANCETS Misc Patient to check BS once daily   sulfamethoxazole-trimethoprim 800-160 MG tablet Commonly known as:  BACTRIM DS,SEPTRA DS Take 1 tablet by mouth 2 (two) times daily.   Vitamin D3 2000 units capsule Take 2,000 Units by mouth daily.          Objective:    BP 135/81   Pulse 70   Temp (!) 97.5 F (36.4 C) (Oral)   Ht 5' 4.5" (1.638 m)   Wt 258 lb (117 kg)   BMI 43.60 kg/m   Wt Readings from Last 3 Encounters:  08/04/17 258 lb (117 kg)  07/05/17 253 lb (114.8 kg)  05/06/17 252 lb (114.3 kg)    Physical Exam  Constitutional: She is oriented to person, place, and time. She appears well-developed and well-nourished. No distress.  Eyes: Conjunctivae are normal.  Neck: Neck supple. No thyromegaly present.  Cardiovascular: Normal rate, regular rhythm, normal heart sounds and intact distal pulses.  No murmur heard. Pulmonary/Chest: Effort normal and breath sounds normal. No respiratory distress. She has no wheezes.  Musculoskeletal: Normal range of motion. She exhibits no edema or tenderness.  Lymphadenopathy:    She has no cervical adenopathy.  Neurological: She is alert and oriented to person, place, and time. Coordination normal.  Skin: Skin is warm and dry. No rash noted. She is not diaphoretic.  Psychiatric: She has a normal mood and affect. Her behavior is normal.  Nursing note and vitals reviewed.       Assessment & Plan:   Problem List  Items Addressed This Visit      Cardiovascular and Mediastinum   Hypertension associated with diabetes ([place])   Relevant Orders   CMP14+EGFR (Completed)     Digestive   GERD (gastroesophageal reflux disease) (Chronic)   Relevant Orders   CBC with Differential/Platelet (Completed)     Endocrine   Hyperlipidemia associated with type 2 diabetes mellitus ([place])   Relevant Orders   Lipid panel (Completed)   Hypothyroid   Relevant Orders   TSH (Completed)   Type 2 diabetes mellitus with other specified complication ([place])   Relevant Orders   Bayer DCA Hb A1c Waived (Completed)   CMP14+EGFR (Completed)   Microalbumin / creatinine urine ratio (Completed)   Primary hyperparathyroidism ([place])     Musculoskeletal and Integument   Statin-induced myositis     Other   Vitamin D deficiency   Morbid obesity ([place]) - Primary   Relevant Orders   Lipid  panel (Completed)       Follow up plan: Return in about 3 months (around 11/04/2017), or if symptoms worsen or fail to improve, for Type 2 diabetes, also needs annual wellness visit.  Counseling provided for all of the vaccine components Orders Placed This Encounter  Procedures  . Bayer DCA Hb A1c Waived  . CMP14+EGFR  . CBC with Differential/Platelet  . Lipid panel  . TSH    Caryl Pina, MD [facility] 08/04/2017, 11:28 AM

## 2017-08-05 LAB — LIPID PANEL
Chol/HDL Ratio: 3.7 ratio (ref 0.0–4.4)
Cholesterol, Total: 148 mg/dL (ref 100–199)
HDL: 40 mg/dL (ref >39)
LDL Calculated: 74 mg/dL (ref 0–99)
Triglycerides: 171 mg/dL — ABNORMAL HIGH (ref 0–149)
VLDL Cholesterol Cal: 34 mg/dL (ref 5–40)

## 2017-08-05 LAB — CBC WITH DIFFERENTIAL/PLATELET
Basophils Absolute: 0 10*3/uL (ref 0.0–0.2)
Basos: 1 %
EOS (ABSOLUTE): 0.2 10*3/uL (ref 0.0–0.4)
Eos: 3 %
Hematocrit: 39.6 % (ref 34.0–46.6)
Hemoglobin: 13.5 g/dL (ref 11.1–15.9)
Immature Grans (Abs): 0 10*3/uL (ref 0.0–0.1)
Immature Granulocytes: 0 %
Lymphocytes Absolute: 2.5 10*3/uL (ref 0.7–3.1)
Lymphs: 36 %
MCH: 30.4 pg (ref 26.6–33.0)
MCHC: 34.1 g/dL (ref 31.5–35.7)
MCV: 89 fL (ref 79–97)
Monocytes Absolute: 0.8 10*3/uL (ref 0.1–0.9)
Monocytes: 12 %
Neutrophils Absolute: 3.3 10*3/uL (ref 1.4–7.0)
Neutrophils: 48 %
Platelets: 288 10*3/uL (ref 150–379)
RBC: 4.44 x10E6/uL (ref 3.77–5.28)
RDW: 14.4 % (ref 12.3–15.4)
WBC: 6.9 10*3/uL (ref 3.4–10.8)

## 2017-08-05 LAB — TSH: TSH: 2.81 u[IU]/mL (ref 0.450–4.500)

## 2017-08-05 LAB — CMP14+EGFR
ALT: 34 IU/L — ABNORMAL HIGH (ref 0–32)
AST: 25 IU/L (ref 0–40)
Albumin/Globulin Ratio: 1.4 (ref 1.2–2.2)
Albumin: 4.1 g/dL (ref 3.5–4.8)
Alkaline Phosphatase: 69 IU/L (ref 39–117)
BUN/Creatinine Ratio: 15 (ref 12–28)
BUN: 14 mg/dL (ref 8–27)
Bilirubin Total: 0.4 mg/dL (ref 0.0–1.2)
CO2: 26 mmol/L (ref 20–29)
Calcium: 9.1 mg/dL (ref 8.7–10.3)
Chloride: 101 mmol/L (ref 96–106)
Creatinine, Ser: 0.94 mg/dL (ref 0.57–1.00)
GFR calc Af Amer: 69 mL/min/{1.73_m2} (ref >59)
GFR calc non Af Amer: 60 mL/min/{1.73_m2} (ref >59)
Globulin, Total: 2.9 g/dL (ref 1.5–4.5)
Glucose: 113 mg/dL — ABNORMAL HIGH (ref 65–99)
Potassium: 4.4 mmol/L (ref 3.5–5.2)
Sodium: 142 mmol/L (ref 134–144)
Total Protein: 7 g/dL (ref 6.0–8.5)

## 2017-08-05 LAB — MICROALBUMIN / CREATININE URINE RATIO
Creatinine, Urine: 70.1 mg/dL
Microalb/Creat Ratio: <4.3 mg/g creat (ref 0.0–30.0)
Microalbumin, Urine: <3 ug/mL

## 2017-08-09 DIAGNOSIS — M609 Myositis, unspecified: Secondary | ICD-10-CM | POA: Insufficient documentation

## 2017-08-09 DIAGNOSIS — T466X5A Adverse effect of antihyperlipidemic and antiarteriosclerotic drugs, initial encounter: Secondary | ICD-10-CM

## 2017-08-23 DIAGNOSIS — D229 Melanocytic nevi, unspecified: Secondary | ICD-10-CM | POA: Diagnosis not present

## 2017-08-23 DIAGNOSIS — L821 Other seborrheic keratosis: Secondary | ICD-10-CM | POA: Diagnosis not present

## 2017-08-23 DIAGNOSIS — L923 Foreign body granuloma of the skin and subcutaneous tissue: Secondary | ICD-10-CM | POA: Diagnosis not present

## 2017-08-23 DIAGNOSIS — L570 Actinic keratosis: Secondary | ICD-10-CM | POA: Diagnosis not present

## 2017-08-23 DIAGNOSIS — L720 Epidermal cyst: Secondary | ICD-10-CM | POA: Diagnosis not present

## 2017-08-29 ENCOUNTER — Ambulatory Visit (INDEPENDENT_AMBULATORY_CARE_PROVIDER_SITE_OTHER): Payer: Medicare Other

## 2017-08-29 ENCOUNTER — Encounter: Payer: Self-pay | Admitting: *Deleted

## 2017-08-29 ENCOUNTER — Ambulatory Visit (INDEPENDENT_AMBULATORY_CARE_PROVIDER_SITE_OTHER): Payer: Medicare Other | Admitting: *Deleted

## 2017-08-29 VITALS — BP 111/64 | HR 71 | Ht 63.0 in | Wt 259.0 lb

## 2017-08-29 DIAGNOSIS — Z780 Asymptomatic menopausal state: Secondary | ICD-10-CM

## 2017-08-29 DIAGNOSIS — Z0000 Encounter for general adult medical examination without abnormal findings: Secondary | ICD-10-CM | POA: Diagnosis not present

## 2017-08-29 DIAGNOSIS — M85832 Other specified disorders of bone density and structure, left forearm: Secondary | ICD-10-CM | POA: Diagnosis not present

## 2017-08-29 NOTE — Patient Instructions (Addendum)
Schedule appointment with Dr. Watt Climes to discuss gastrointestinal issues.  Try to include mostly lean proteins, vegetables, fruits and whole grains in moderation in your diet.   Thank you for coming in for your Annual Wellness Visit today!   Preventive Care 76 Years and Older, Female Preventive care refers to lifestyle choices and visits with your health care provider that can promote health and wellness. What does preventive care include?  A yearly physical exam. This is also called an annual well check.  Dental exams once or twice a year.  Routine eye exams. Ask your health care provider how often you should have your eyes checked.  Personal lifestyle choices, including: ? Daily care of your teeth and gums. ? Regular physical activity. ? Eating a healthy diet. ? Avoiding tobacco and drug use. ? Limiting alcohol use. ? Practicing safe sex. ? Taking low-dose aspirin every day. ? Taking vitamin and mineral supplements as recommended by your health care provider. What happens during an annual well check? The services and screenings done by your health care provider during your annual well check will depend on your age, overall health, lifestyle risk factors, and family history of disease. Counseling Your health care provider may ask you questions about your:  Alcohol use.  Tobacco use.  Drug use.  Emotional well-being.  Home and relationship well-being.  Sexual activity.  Eating habits.  History of falls.  Memory and ability to understand (cognition).  Work and work Statistician.  Reproductive health.  Screening You may have the following tests or measurements:  Height, weight, and BMI.  Blood pressure.  Lipid and cholesterol levels. These may be checked every 5 years, or more frequently if you are over 27 years old.  Skin check.  Lung cancer screening. You may have this screening every year starting at age 37 if you have a 30-pack-year history of smoking  and currently smoke or have quit within the past 15 years.  Fecal occult blood test (FOBT) of the stool. You may have this test every year starting at age 23.  Flexible sigmoidoscopy or colonoscopy. You may have a sigmoidoscopy every 5 years or a colonoscopy every 10 years starting at age 10.  Hepatitis C blood test.  Hepatitis B blood test.  Sexually transmitted disease (STD) testing.  Diabetes screening. This is done by checking your blood sugar (glucose) after you have not eaten for a while (fasting). You may have this done every 1-3 years.  Bone density scan. This is done to screen for osteoporosis. You may have this done starting at age 33.  Mammogram. This may be done every 1-2 years. Talk to your health care provider about how often you should have regular mammograms.  Talk with your health care provider about your test results, treatment options, and if necessary, the need for more tests. Vaccines Your health care provider may recommend certain vaccines, such as:  Influenza vaccine. This is recommended every year.  Tetanus, diphtheria, and acellular pertussis (Tdap, Td) vaccine. You may need a Td booster every 10 years.  Varicella vaccine. You may need this if you have not been vaccinated.  Zoster vaccine. You may need this after age 16.  Measles, mumps, and rubella (MMR) vaccine. You may need at least one dose of MMR if you were born in 1957 or later. You may also need a second dose.  Pneumococcal 13-valent conjugate (PCV13) vaccine. One dose is recommended after age 72.  Pneumococcal polysaccharide (PPSV23) vaccine. One dose is recommended after age  65.  Meningococcal vaccine. You may need this if you have certain conditions.  Hepatitis A vaccine. You may need this if you have certain conditions or if you travel or work in places where you may be exposed to hepatitis A.  Hepatitis B vaccine. You may need this if you have certain conditions or if you travel or work in  places where you may be exposed to hepatitis B.  Haemophilus influenzae type b (Hib) vaccine. You may need this if you have certain conditions.  Talk to your health care provider about which screenings and vaccines you need and how often you need them. This information is not intended to replace advice given to you by your health care provider. Make sure you discuss any questions you have with your health care provider. Document Released: 05/23/2015 Document Revised: 01/14/2016 Document Reviewed: 02/25/2015 Elsevier Interactive Patient Education  [employer].

## 2017-08-30 NOTE — Progress Notes (Signed)
Subjective:   Karen Potter is a 77 y.o. female who presents for an Initial Medicare Annual Wellness Visit.  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  She enjoys working on genealogy projects and volunteering at her church.  She lives with her husband.  They have 2 biological children, and 2 adopted children, 5 grandchildren and 7 great grandchildren.  She feels her health this year is worse than last year because her energy level is decreased.  She reports no hospitalizations, ER visits or surgeries in the past year.  She does complain of feeling bloated and like she has to go to have a bowel movement most of the time.    Review of Systems    All negative today       Objective:    Today's Vitals   08/29/17 1338  BP: 111/64  Pulse: 71  Weight: 259 lb (117.5 kg)  Height: 5\' 3"  (1.6 m)  PainSc: 0-No pain   Body mass index is 45.88 kg/m.  Advanced Directives 08/29/2017 11/17/2015 08/29/2015 08/29/2015 05/06/2015 05/06/2015 04/28/2015  Does Patient Have a Medical Advance Directive? Yes Yes No;Yes No Yes - Yes  Type of Advance Directive Living will Living will Living will - Living will - Living will  Does patient want to make changes to medical advance directive? No - Patient declined - - - No - Patient declined - No - Patient declined  Copy of Healthcare Power of Attorney in Chart? - No - copy requested No - copy requested - No - copy requested (No Data) No - copy requested  Would patient like information on creating a medical advance directive? - - No - patient declined information - - - -    Current Medications (verified) Outpatient Encounter Medications as of 08/29/2017  Medication Sig  . albuterol (PROVENTIL HFA;VENTOLIN HFA) 108 (90 Base) MCG/ACT inhaler Inhale 2 puffs into the lungs every 6 (six) hours as needed for wheezing or shortness of breath.  Marland Kitchen aspirin 325 MG tablet Take 325 mg by mouth every evening.   . carvedilol (COREG) 6.25 MG  tablet Take 1 tablet (6.25 mg total) by mouth 2 (two) times daily with a meal.  . Cholecalciferol (VITAMIN D3) 2000 UNITS capsule Take 2,000 Units by mouth daily.  . clobetasol ointment (TEMOVATE) 1.85 % Apply 1 application topically 2 (two) times daily.  . clotrimazole-betamethasone (LOTRISONE) cream APPLY  CREAM TOPICALLY TWICE DAILY  . levothyroxine (SYNTHROID, LEVOTHROID) 150 MCG tablet TAKE 1 TABLET BY MOUTH ONCE DAILY  . losartan-hydrochlorothiazide (HYZAAR) 100-25 MG tablet TAKE 1 TABLET BY MOUTH ONCE DAILY  . Magnesium 500 MG TABS Take 500 mg by mouth daily.  . Omega-3 Fatty Acids (FISH OIL TRIPLE STRENGTH) 1400 MG CAPS Take 1,400 mg by mouth 4 (four) times daily.   Marland Kitchen omeprazole (PRILOSEC) 20 MG capsule Take 1 capsule (20 mg total) by mouth 2 (two) times daily before a meal.  . glucose blood test strip Test BS 1 time a day (Patient not taking: Reported on 08/29/2017)  . ONE TOUCH LANCETS MISC Patient to check BS once daily (Patient not taking: Reported on 08/29/2017)  . [DISCONTINUED] sulfamethoxazole-trimethoprim (BACTRIM DS,SEPTRA DS) 800-160 MG tablet Take 1 tablet by mouth 2 (two) times daily.   No facility-administered encounter medications on file as of 08/29/2017.     Allergies (verified) Codeine; Dilaudid [hydromorphone]; Other; and Doxycycline   History: Past Medical History:  Diagnosis Date  . COPD, mild ([place])   .  Duodenal ulcer without hemorrhage or perforation    08-29-2015 per EGD reort  . Dyspnea on exertion   . Fatty liver   . Gastric ulcer without hemorrhage or perforation    08-29-2015 per EGD report  . Gastritis    per EGD 08-29-2015  . GERD (gastroesophageal reflux disease)   . Hearing loss    no hearing aids  . 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   age 59--  pt states was quarantined   no treatment ;  per pt no symptoms or issues since  . History of kidney stones   .  History of TIA (transient ischemic attack)    03-23-2014  w/ episode temporay amnesia  . Hyperlipidemia   . Hypertension   . Hypothyroidism   . Iron deficiency anemia   . LAFB (left anterior fascicular block)   . OA (osteoarthritis)   . OSA on CPAP    severe per study 2003  . 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 RBBB (right bundle branch block)   . Renal calculi    right side  . Seasonal allergies   . Type 2 diabetes mellitus ([place])   . Vitamin D deficiency   . Wears glasses    Past Surgical History:  Procedure Laterality Date  . CARPAL TUNNEL RELEASE Right 05-10-2007  . CHOLECYSTECTOMY OPEN  1985   and Appendectomy  . CYSTO/  RIGHT URETEROSCOPIC STONE EXTRACTON/  STENT PLACEMENT  06/ 2016   in [place]  . CYSTOSCOPY/RETROGRADE/URETEROSCOPY/STONE EXTRACTION WITH BASKET Right 11/17/2015   Procedure: CYSTOSCOPY/RETROGRADE PYELOGRAM RIGHT/DIAGNOSTIC RIGHT URETEROSCOPY/LASER RENAL CALCIFICATION/RIGHT STENT PLACEMENT;  Surgeon: Cleon Gustin, MD;  Location: [facility];  Service: Urology;  Laterality: Right;  . ESOPHAGOGASTRODUODENOSCOPY N/A 08/29/2015   Procedure: ESOPHAGOGASTRODUODENOSCOPY (EGD);  Surgeon: Danie Binder, MD;  Location[facility];  Service: Endoscopy;  Laterality: N/A;  . HOLMIUM LASER APPLICATION Right 5/78/4696   Procedure: HOLMIUM LASER APPLICATION;  Surgeon: Cleon Gustin, MD;  Location: [facility];  Service: Urology;  Laterality: Right;  . KNEE ARTHROSCOPY Right 1999  . LEFT HEEL REPAIR SURGERY  1954;  1985;  1995   polio  . LUMBAR SPINE SURGERY  09-01-2010   L4 -L5 fusion,  L3 laminectomy,  L5 - S1 foraminotomy  . OVARIAN CYST SURGERY Right 1966  . PARATHYROIDECTOMY N/A 05/06/2015   Procedure: PARATHYROIDECTOMY;  Surgeon: Armandina Gemma, MD;  Location: [facility];  Service: General;  Laterality: N/A;   left superior  .  TONSILLECTOMY  as child  . TOTAL ABDOMINAL HYSTERECTOMY W/ BILATERAL SALPINGOOPHORECTOMY  1984  . TOTAL KNEE ARTHROPLASTY Right 04-08-2008  . TRANSTHORACIC ECHOCARDIOGRAM  04-07-2012   grade 1 diastolic function,  ef 29-52%/  mild AV calcification without stenosis/  trivial MR   Family History  Problem Relation Age of Onset  . Heart failure Mother   . Cancer Father   . Cancer Brother    Social History   Socioeconomic History  . Marital status: Married    Spouse name: Not on file  . Number of children: Not on file  . Years of education: Not on file  . Highest education level: Not on file  Occupational History  . Not on file  Social Needs  .  Financial resource strain: Not on file  . Food insecurity:    Worry: Not on file    Inability: Not on file  . Transportation needs:    Medical: Not on file    Non-medical: Not on file  Tobacco Use  . Smoking status: Never Smoker  . Smokeless tobacco: Never Used  Substance and Sexual Activity  . Alcohol use: No  . Drug use: No  . Sexual activity: Not on file  Lifestyle  . Physical activity:    Days per week: Not on file    Minutes per session: Not on file  . Stress: Not on file  Relationships  . Social connections:    Talks on phone: Not on file    Gets together: Not on file    Attends religious service: Not on file    Active member of club or organization: Not on file    Attends meetings of clubs or organizations: Not on file    Relationship status: Not on file  Other Topics Concern  . Not on file  Social History Narrative  . Not on file    Tobacco Counseling Counseling given: No   Clinical Intake:     Pain Score: 0-No pain                  Activities of Daily Living In your present state of health, do you have any difficulty performing the following activities: 08/29/2017  Hearing? Y  Comment significantly decreased hearing left ear, has been evaluated by augiology   Vision? Y  Comment problems with  vision when looking at computer  Difficulty concentrating or making decisions? N  Walking or climbing stairs? Y  Comment due to fatigue   Dressing or bathing? N  Doing errands, shopping? N  Preparing Food and eating ? N  Using the Toilet? N  In the past six months, have you accidently leaked urine? Y  Do you have problems with loss of bowel control? Y  Comment has had urgency since having cholecystectomy  Managing your Medications? N  Managing your Finances? N  Housekeeping or managing your Housekeeping? N  Some recent data might be hidden     Immunizations and Health Maintenance Immunization History  Administered Date(s) Administered  . Influenza, High Dose Seasonal PF 03/20/2013, 02/24/2016  . Influenza,inj,Quad PF,6+ Mos 02/19/2014, 02/06/2015  . Pneumococcal Conjugate-13 08/05/2014  . Pneumococcal Polysaccharide-23 02/07/2009  . Tdap 05/10/2010  . Zoster 05/10/2010   Health Maintenance Due  Topic Date Due  . OPHTHALMOLOGY EXAM  11/05/2016  . COLONOSCOPY  06/17/2017   Patient states she has had an eye exam at Dr. Ammie Ferrier office within the last year.  Called their office and requested records to be faxed. Patient had colonoscopy performed by Dr. Watt Climes 06/2012.  States she received a letter in the mail recently stating that she does not need to have repeated for 5 more years. Requested colonoscopy results from Dr. Perley Jain office from 2014, will confirm with their office if patient needs colonoscopy now or in 5 years.     Patient Care Team: Dettinger, Fransisca Kaufmann, MD as PCP - General (Family Medicine) Magod,Marc, MD (Gastroenterology) Nicolette Bang, MD (Urology)     Assessment:   This is a routine wellness examination for Christ[place].  Hearing/Vision screen Patient states she has significantly decreased hearing in her left ear, and has been evaluated by audiology.  She did not get hearing aids, does not wish to have another referral at this time. States  vision with  glasses is good except when she looks at a computer screen for prolonged periods of time.  Dietary issues and exercise activities discussed: Current Exercise Habits: The patient does not participate in regular exercise at present, Exercise limited by: orthopedic condition(s)  Goals    . Patient Stated     She wants to improve her gastrointestinal health.  States she feels very bloated with significant flatulence, and has the sensation that she needs to have a bowel movement most of the time.  She is a patient of Dr. Watt Climes and her goal is to be evaluated by him and find out what is going on.  Advised her to schedule appointment with him as soon as possible for this evaluation.        Patient states she usually eats 2 meals per day and snacks as needed.  She states she and her husband eat out frequently because they are on the go most days.  Advised a diet of mostly lean proteins, vegetables, and whole grains and fruits in moderation.   Depression Screen PHQ 2/9 Scores 08/30/2017 08/04/2017 08/04/2017 05/06/2017 02/03/2017 10/28/2016 07/23/2016  PHQ - 2 Score 0 0 0 0 0 0 0  PHQ- 9 Score - - - - - - -    Fall Risk Fall Risk  08/30/2017 08/04/2017 05/06/2017 02/03/2017 10/28/2016  Falls in the past year? Yes Yes No No No  Number falls in past yr: 1 1 - - -  Injury with Fall? No No - - -  Risk Factor Category  - - - - -  Risk for fall due to : Impaired mobility - - - -  Follow up Education provided;Falls prevention discussed - - - -    Is the patient's home free of loose throw rugs in walkways, pet beds, electrical cords, etc?   yes      Grab bars in the bathroom? no      Handrails on the stairs?   yes      Adequate lighting?   yes    Cognitive Function: MMSE - Mini Mental State Exam 08/29/2017  Orientation to time 5  Orientation to Place 5  Registration 3  Attention/ Calculation 5  Recall 3  Language- name 2 objects 2  Language- repeat 1  Language- follow 3 step command 3  Language-  read & follow direction 1  Write a sentence 1  Copy design 1  Total score 30        Screening Tests Health Maintenance  Topic Date Due  . OPHTHALMOLOGY EXAM  11/05/2016  . COLONOSCOPY  06/17/2017  . INFLUENZA VACCINE  12/08/2017  . HEMOGLOBIN A1C  02/04/2018  . MAMMOGRAM  06/06/2018  . FOOT EXAM  08/05/2018  . TETANUS/TDAP  05/10/2020  . DEXA SCAN  Completed  . PNA vac Low Risk Adult  Completed    Qualifies for Shingles Vaccine? Yes, patient placed on list to call when additional vaccine arrives, currently on back order.  Cancer Screenings: Lung: Low Dose CT Chest recommended if Age 12-80 years, 30 pack-year currently smoking OR have quit w/in 15years. Patient does not qualify. Breast: Up to date on Mammogram? Yes   Up to date of Bone Density/Dexa? Yes Colorectal: up to date per patient      Plan:   Schedule appointment with Dr. Watt Climes to discuss gastrointestinal issues. Try to include mostly lean proteins, vegetables, fruits and whole grains in moderation in your diet.    I have personally reviewed  and noted the following in the patient's chart:   . Medical and social history . Use of alcohol, tobacco or illicit drugs  . Current medications and supplements . Functional ability and status . Nutritional status . Physical activity . Advanced directives . List of other physicians . Hospitalizations, surgeries, and ER visits in previous 12 months . Vitals . Screenings to include cognitive, depression, and falls . Referrals and appointments  In addition, I have reviewed and discussed with patient certain preventive protocols, quality metrics, and best practice recommendations. A written personalized care plan for preventive services as well as general preventive health recommendations were provided to patient.     Denyce Robert, RN   08/30/2017

## 2017-09-02 DIAGNOSIS — K257 Chronic gastric ulcer without hemorrhage or perforation: Secondary | ICD-10-CM | POA: Diagnosis not present

## 2017-09-02 DIAGNOSIS — R197 Diarrhea, unspecified: Secondary | ICD-10-CM | POA: Diagnosis not present

## 2017-09-05 ENCOUNTER — Encounter: Payer: Self-pay | Admitting: Physician Assistant

## 2017-09-05 ENCOUNTER — Ambulatory Visit (INDEPENDENT_AMBULATORY_CARE_PROVIDER_SITE_OTHER): Payer: Medicare Other | Admitting: Physician Assistant

## 2017-09-05 VITALS — BP 117/72 | HR 70 | Temp 98.6°F | Ht 63.0 in | Wt 260.4 lb

## 2017-09-05 DIAGNOSIS — B9689 Other specified bacterial agents as the cause of diseases classified elsewhere: Secondary | ICD-10-CM | POA: Diagnosis not present

## 2017-09-05 DIAGNOSIS — J208 Acute bronchitis due to other specified organisms: Secondary | ICD-10-CM

## 2017-09-05 MED ORDER — BENZONATATE 200 MG PO CAPS: 200.0000 mg | ORAL_CAPSULE | Freq: Two times a day (BID) | ORAL | 0 refills | Status: DC | PRN

## 2017-09-05 MED ORDER — CEFDINIR 300 MG PO CAPS: 300.0000 mg | ORAL_CAPSULE | Freq: Two times a day (BID) | ORAL | 0 refills | Status: DC

## 2017-09-05 MED ORDER — PREDNISONE 10 MG (21) PO TBPK: ORAL_TABLET | ORAL | 0 refills | Status: DC

## 2017-09-05 NOTE — Progress Notes (Signed)
Patient has appointment with PA at Dr. Perley Jain office on 09/09/17.

## 2017-09-05 NOTE — Progress Notes (Signed)
BP 117/72   Pulse 70   Temp 98.6 F (37 C) (Oral)   Ht 5\' 3"  (1.6 m)   Wt 260 lb 6.4 oz (118.1 kg)   BMI 46.13 kg/m    Subjective:    Patient ID: Karen Potter, female    DOB: 1940-07-24, 77 y.o.   MRN: [id number]  HPI: Karen Potter is a 77 y.o. female presenting on 09/05/2017 for Cough  Patient with several days of progressing upper respiratory and bronchial symptoms. Initially there was more upper respiratory congestion. This progressed to having significant cough that is productive throughout the day and severe at night. There is occasional wheezing after coughing. Sometimes there is slight dyspnea on exertion. It is productive mucus that is yellow in color. Denies any blood.   Past Medical History:  Diagnosis Date  . COPD, mild ([place])   . Duodenal ulcer without hemorrhage or perforation    08-29-2015 per EGD reort  . Dyspnea on exertion   . Fatty liver   . Gastric ulcer without hemorrhage or perforation    08-29-2015 per EGD report  . Gastritis    per EGD 08-29-2015  . GERD (gastroesophageal reflux disease)   . Hearing loss    no hearing aids  . 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   age 61--  pt states was quarantined   no treatment ;  per pt no symptoms or issues since  . History of kidney stones   . History of TIA (transient ischemic attack)    03-23-2014  w/ episode temporay amnesia  . Hyperlipidemia   . Hypertension   . Hypothyroidism   . Iron deficiency anemia   . LAFB (left anterior fascicular block)   . OA (osteoarthritis)   . OSA on CPAP    severe per study 2003  . 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 RBBB (right bundle branch block)   . Renal calculi    right side  . Seasonal allergies   . Type 2 diabetes mellitus ([place])   . Vitamin D deficiency   . Wears glasses     Relevant past medical, surgical, family and social history reviewed and updated as indicated. Interim medical history since our last visit reviewed. Allergies and medications reviewed and updated. DATA REVIEWED: CHART IN EPIC  Family History reviewed for pertinent findings.  Review of Systems  Constitutional: Positive for chills and fatigue. Negative for activity change, appetite change and fever.  HENT: Positive for congestion, postnasal drip and sore throat.   Eyes: Negative.   Respiratory: Positive for cough and wheezing. Negative for shortness of breath.   Cardiovascular: Negative.  Negative for chest pain, palpitations and leg swelling.  Gastrointestinal: Negative.   Genitourinary: Negative.   Musculoskeletal: Negative.   Skin: Negative.   Neurological: Positive for headaches.    Allergies as of 09/05/2017      Reactions   Codeine Itching   Dilaudid [hydromorphone] Other (See Comments)   Mouth blisters   Other Itching   Most pain meds cause itching.  When has to take pain medication, she has been instructed to take Benadryl   Doxycycline Rash      Medication List        Accurate as of 09/05/17  3:45 PM. Always use your most recent med list.          albuterol 108 (90 Base) MCG/ACT inhaler Commonly known as:  PROVENTIL HFA;VENTOLIN HFA Inhale 2 puffs into the lungs every 6 (six) hours as needed for wheezing or shortness of breath.   aspirin 325 MG tablet Take 325 mg by mouth every evening.   benzonatate 200 MG capsule Commonly known as:  TESSALON Take 1 capsule (200 mg total) by mouth 2 (two) times daily as needed for cough.   carvedilol 6.25 MG tablet Commonly known as:  COREG Take 1 tablet (6.25 mg total) by mouth 2 (two) times daily with a meal.   cefdinir 300 MG capsule Commonly known as:  OMNICEF Take 1 capsule (300 mg total) by mouth 2 (two) times daily. 1 po BID   clobetasol ointment 0.05 % Commonly known as:  TEMOVATE Apply 1 application  topically 2 (two) times daily.   clotrimazole-betamethasone cream Commonly known as:  LOTRISONE APPLY  CREAM TOPICALLY TWICE DAILY   FISH OIL TRIPLE STRENGTH 1400 MG Caps Take 1,400 mg by mouth 4 (four) times daily.   glucose blood test strip Test BS 1 time a day   levothyroxine 150 MCG tablet Commonly known as:  SYNTHROID, LEVOTHROID TAKE 1 TABLET BY MOUTH ONCE DAILY   losartan-hydrochlorothiazide 100-25 MG tablet Commonly known as:  HYZAAR TAKE 1 TABLET BY MOUTH ONCE DAILY   Magnesium 500 MG Tabs Take 500 mg by mouth daily.   omeprazole 20 MG capsule Commonly known as:  PRILOSEC Take 1 capsule (20 mg total) by mouth 2 (two) times daily before a meal.   ONE TOUCH LANCETS Misc Patient to check BS once daily   predniSONE 10 MG (21) Tbpk tablet Commonly known as:  STERAPRED UNI-PAK 21 TAB Take as directed   Vitamin D3 2000 units capsule Take 2,000 Units by mouth daily.          Objective:    BP 117/72   Pulse 70   Temp 98.6 F (37 C) (Oral)   Ht 5\' 3"  (1.6 m)   Wt 260 lb 6.4 oz (118.1 kg)   BMI 46.13 kg/m   Allergies  Allergen Reactions  . Codeine Itching  . Dilaudid [Hydromorphone] Other (See Comments)    Mouth blisters  . Other Itching    Most pain meds cause itching.  When has to take pain medication, she has been instructed to take Benadryl  . Doxycycline Rash    Wt Readings from Last 3 Encounters:  09/05/17 260 lb 6.4 oz (118.1 kg)  08/29/17 259 lb (117.5 kg)  08/04/17 258 lb (117 kg)    Physical Exam  Constitutional: She is oriented to person, place, and time. She appears well-developed and well-nourished.  HENT:  Head: Normocephalic and atraumatic.  Right Ear: There is drainage and tenderness.  Left Ear: There is drainage and tenderness.  Nose: Mucosal edema and rhinorrhea present. Right sinus exhibits no maxillary sinus tenderness and no frontal sinus tenderness. Left sinus exhibits no maxillary sinus tenderness and no frontal sinus  tenderness.  Mouth/Throat: Oropharyngeal exudate and posterior oropharyngeal erythema present.  Eyes: Pupils are equal, round, and reactive to light. Conjunctivae and EOM are normal.  Neck: Normal range of motion. Neck supple.  Cardiovascular: Normal rate, regular rhythm, normal heart sounds and intact distal pulses.  Pulmonary/Chest: Effort normal. She has wheezes in the right upper field and the left upper field.  Abdominal: Soft. Bowel sounds are normal.  Neurological:  She is alert and oriented to person, place, and time. She has normal reflexes.  Skin: Skin is warm and dry. No rash noted.  Psychiatric: She has a normal mood and affect. Her behavior is normal. Judgment and thought content normal.        Assessment & Plan:   1. Acute bacterial bronchitis - predniSONE (STERAPRED UNI-PAK 21 TAB) 10 MG (21) TBPK tablet; Take as directed  Dispense: 21 tablet; Refill: 0 - cefdinir (OMNICEF) 300 MG capsule; Take 1 capsule (300 mg total) by mouth 2 (two) times daily. 1 po BID  Dispense: 20 capsule; Refill: 0 - benzonatate (TESSALON) 200 MG capsule; Take 1 capsule (200 mg total) by mouth 2 (two) times daily as needed for cough.  Dispense: 20 capsule; Refill: 0   Continue all other maintenance medications as listed above.  Follow up plan: No follow-ups on file.  Educational handout given for [place] PA-C [place] [address]  [place], [place] [address] [phone]   09/05/2017, 3:45 PM

## 2017-09-06 DIAGNOSIS — R197 Diarrhea, unspecified: Secondary | ICD-10-CM | POA: Diagnosis not present

## 2017-09-07 ENCOUNTER — Other Ambulatory Visit: Payer: Self-pay | Admitting: Physician Assistant

## 2017-09-07 ENCOUNTER — Other Ambulatory Visit: Payer: Self-pay | Admitting: Family Medicine

## 2017-09-07 DIAGNOSIS — B9689 Other specified bacterial agents as the cause of diseases classified elsewhere: Secondary | ICD-10-CM

## 2017-09-07 DIAGNOSIS — J208 Acute bronchitis due to other specified organisms: Principal | ICD-10-CM

## 2017-09-07 NOTE — Telephone Encounter (Signed)
Last seen 09/05/17

## 2017-09-13 ENCOUNTER — Telehealth: Payer: Self-pay | Admitting: Family Medicine

## 2017-09-13 NOTE — Telephone Encounter (Signed)
Pt is out of town Pt has bilateral eye redness with matting Pt would like RX sent to [facility] in [place]

## 2017-09-13 NOTE — Telephone Encounter (Signed)
Is there redness to the whites of the eyes like a conjunctivitis? Or is there itching and swelling like allergies?

## 2017-09-13 NOTE — Telephone Encounter (Signed)
Pt has redness in conjunctivae with green drainage causing matting

## 2017-09-14 ENCOUNTER — Other Ambulatory Visit: Payer: Self-pay | Admitting: Physician Assistant

## 2017-09-14 MED ORDER — ERYTHROMYCIN 5 MG/GM OP OINT: 1.0000 "application " | TOPICAL_OINTMENT | Freq: Three times a day (TID) | OPHTHALMIC | 0 refills | Status: DC

## 2017-09-14 NOTE — Telephone Encounter (Signed)
sent 

## 2017-09-14 NOTE — Telephone Encounter (Signed)
Rx pending- please review and make sure rx is correct and send

## 2017-09-14 NOTE — Telephone Encounter (Signed)
I tried to find [facility] and there was not one. Erythromycin Ophthalmic ointment #3.5 g Apply small amount in lower lid TID

## 2017-09-19 ENCOUNTER — Telehealth: Payer: Self-pay | Admitting: Family Medicine

## 2017-09-19 MED ORDER — AZITHROMYCIN 250 MG PO TABS: ORAL_TABLET | ORAL | 0 refills | Status: DC

## 2017-09-19 NOTE — Telephone Encounter (Signed)
Sent in a zpak for the patient

## 2017-09-19 NOTE — Telephone Encounter (Signed)
Patient notified

## 2017-09-19 NOTE — Telephone Encounter (Signed)
Green congestion  Cough No fever  [facility] [employer]

## 2017-09-29 ENCOUNTER — Ambulatory Visit (INDEPENDENT_AMBULATORY_CARE_PROVIDER_SITE_OTHER): Payer: Medicare Other

## 2017-09-29 ENCOUNTER — Encounter: Payer: Self-pay | Admitting: Family Medicine

## 2017-09-29 ENCOUNTER — Ambulatory Visit (INDEPENDENT_AMBULATORY_CARE_PROVIDER_SITE_OTHER): Payer: Medicare Other | Admitting: Family Medicine

## 2017-09-29 VITALS — BP 133/81 | HR 77 | Temp 98.0°F | Ht 63.0 in | Wt 263.0 lb

## 2017-09-29 DIAGNOSIS — J189 Pneumonia, unspecified organism: Secondary | ICD-10-CM

## 2017-09-29 DIAGNOSIS — I517 Cardiomegaly: Secondary | ICD-10-CM | POA: Diagnosis not present

## 2017-09-29 DIAGNOSIS — R05 Cough: Secondary | ICD-10-CM | POA: Diagnosis not present

## 2017-09-29 DIAGNOSIS — R0602 Shortness of breath: Secondary | ICD-10-CM | POA: Diagnosis not present

## 2017-09-29 MED ORDER — METHYLPREDNISOLONE ACETATE 80 MG/ML IJ SUSP
80.0000 mg | Freq: Once | INTRAMUSCULAR | Status: AC
Start: 2017-09-29 — End: 2017-09-29
  Administered 2017-09-29: 80 mg via INTRAMUSCULAR

## 2017-09-29 MED ORDER — CEFTRIAXONE SODIUM 1 G IJ SOLR
1.0000 g | Freq: Once | INTRAMUSCULAR | Status: AC
  Administered 2017-09-29: 1 g via INTRAMUSCULAR

## 2017-09-29 MED ORDER — AMOXICILLIN-POT CLAVULANATE 875-125 MG PO TABS: 1.0000 | ORAL_TABLET | Freq: Two times a day (BID) | ORAL | 0 refills | Status: DC

## 2017-09-29 MED ORDER — PREDNISONE 20 MG PO TABS
ORAL_TABLET | ORAL | 0 refills | Status: DC
Start: 2017-09-29 — End: 2017-10-31

## 2017-09-29 MED ORDER — CETIRIZINE HCL 10 MG PO TABS: 10.0000 mg | ORAL_TABLET | Freq: Every day | ORAL | 11 refills | Status: DC

## 2017-09-29 NOTE — Progress Notes (Signed)
BP 133/81   Pulse 77   Temp 98 F (36.7 C) (Oral)   Ht 5\' 3"  (1.6 m)   Wt 263 lb (119.3 kg)   BMI 46.59 kg/m    Subjective:    Patient ID: Karen Potter, female    DOB: 1940-08-22, 77 y.o.   MRN: [id number]  HPI: Karen Potter is a 77 y.o. female presenting on 09/29/2017 for Cough, shortness of breath, wheezing (have not improved) and Eyes red, burning (given erythromycin ointment, has not helped)   HPI Cough and shortness of breath and wheezing She has been having cough and shortness of breath and wheezing for which she was seen a couple weeks ago by her office and she says she just has not improved at all.  She was given 2 rounds of antibiotics and a course of steroids and a shot of steroids while in the office and she did not really feel like any of it made a difference.  She was also given albuterol to help try and break it up and she has been taking Mucinex and does not feel like these are making a significant difference.  She denies any fevers or chills she does have both shortness of breath and wheezing and a cough where she just feels like things are stuck down in her lungs and she cannot get them up.  She has been using erythromycin ointment for eyes which she feels are burning but she does not have any matting or drainage like she had previously.  She denies any sick contacts that she knows of.  Relevant past medical, surgical, family and social history reviewed and updated as indicated. Interim medical history since our last visit reviewed. Allergies and medications reviewed and updated.  Review of Systems  Constitutional: Negative for chills and fever.  HENT: Positive for congestion and postnasal drip. Negative for ear discharge, ear pain, rhinorrhea, sinus pressure, sneezing and sore throat.   Eyes: Negative for pain, redness and visual disturbance.  Respiratory: Positive for cough, chest tightness, shortness of breath and wheezing.   Cardiovascular: Positive for leg  swelling (Little bit of swelling on her left leg). Negative for chest pain and palpitations.  Genitourinary: Negative for difficulty urinating and dysuria.  Musculoskeletal: Negative for back pain and gait problem.  Skin: Negative for rash.  Neurological: Negative for light-headedness and headaches.  Psychiatric/Behavioral: Negative for agitation and behavioral problems.  All other systems reviewed and are negative.   Per HPI unless specifically indicated above   Allergies as of 09/29/2017      Reactions   Codeine Itching   Dilaudid [hydromorphone] Other (See Comments)   Mouth blisters   Other Itching   Most pain meds cause itching.  When has to take pain medication, she has been instructed to take Benadryl   Doxycycline Rash      Medication List        Accurate as of 09/29/17  4:53 PM. Always use your most recent med list.          albuterol 108 (90 Base) MCG/ACT inhaler Commonly known as:  PROVENTIL HFA;VENTOLIN HFA Inhale 2 puffs into the lungs every 6 (six) hours as needed for wheezing or shortness of breath.   amoxicillin-clavulanate 875-125 MG tablet Commonly known as:  AUGMENTIN Take 1 tablet by mouth 2 (two) times daily.   aspirin 325 MG tablet Take 325 mg by mouth every evening.   benzonatate 200 MG capsule Commonly known as:  TESSALON TAKE 1 CAPSULE  BY MOUTH TWICE DAILY AS NEEDED FOR COUGH   carvedilol 6.25 MG tablet Commonly known as:  COREG TAKE 1 TABLET BY MOUTH TWICE DAILY WITH MEALS   clobetasol ointment 0.05 % Commonly known as:  TEMOVATE Apply 1 application topically 2 (two) times daily.   clotrimazole-betamethasone cream Commonly known as:  LOTRISONE APPLY  CREAM TOPICALLY TWICE DAILY   erythromycin ophthalmic ointment Commonly known as:  ROMYCIN Place 1 application into both eyes 3 (three) times daily. Apply small amount in lower lid TID   FISH OIL TRIPLE STRENGTH 1400 MG Caps Take 1,400 mg by mouth 4 (four) times daily.   levothyroxine  150 MCG tablet Commonly known as:  SYNTHROID, LEVOTHROID TAKE 1 TABLET BY MOUTH ONCE DAILY   losartan-hydrochlorothiazide 100-25 MG tablet Commonly known as:  HYZAAR TAKE 1 TABLET BY MOUTH ONCE DAILY   Magnesium 500 MG Tabs Take 500 mg by mouth daily.   omeprazole 20 MG capsule Commonly known as:  PRILOSEC Take 1 capsule (20 mg total) by mouth 2 (two) times daily before a meal.   ONE TOUCH LANCETS Misc Patient to check BS once daily   predniSONE 20 MG tablet Commonly known as:  DELTASONE Take 3 tabs daily for 1 week, then 2 tabs daily for week 2, then 1 tab daily for week 3.   Vitamin D3 2000 units capsule Take 2,000 Units by mouth daily.          Objective:    BP 133/81   Pulse 77   Temp 98 F (36.7 C) (Oral)   Ht 5\' 3"  (1.6 m)   Wt 263 lb (119.3 kg)   BMI 46.59 kg/m   Wt Readings from Last 3 Encounters:  09/29/17 263 lb (119.3 kg)  09/05/17 260 lb 6.4 oz (118.1 kg)  08/29/17 259 lb (117.5 kg)    Physical Exam  Constitutional: She is oriented to person, place, and time. She appears well-developed and well-nourished. No distress.  HENT:  Right Ear: Tympanic membrane, external ear and ear canal normal.  Left Ear: Tympanic membrane, external ear and ear canal normal.  Nose: No mucosal edema or rhinorrhea. No epistaxis. Right sinus exhibits no maxillary sinus tenderness and no frontal sinus tenderness. Left sinus exhibits no maxillary sinus tenderness and no frontal sinus tenderness.  Mouth/Throat: Uvula is midline and mucous membranes are normal. No oropharyngeal exudate, posterior oropharyngeal edema, posterior oropharyngeal erythema or tonsillar abscesses.  Mallampati score 3  Eyes: Pupils are equal, round, and reactive to light. Conjunctivae and EOM are normal.  Cardiovascular: Normal rate, regular rhythm, normal heart sounds and intact distal pulses.  No murmur heard. Pulmonary/Chest: Effort normal. No accessory muscle usage. No apnea, no tachypnea and no  bradypnea. No respiratory distress. She has decreased breath sounds (Very poor inspiratory and air movement). She has no wheezes. She has no rhonchi. She has no rales.  Musculoskeletal: Normal range of motion. She exhibits no edema or tenderness.  Neurological: She is alert and oriented to person, place, and time. Coordination normal.  Skin: Skin is warm and dry. No rash noted. She is not diaphoretic.  Psychiatric: She has a normal mood and affect. Her behavior is normal.  Nursing note and vitals reviewed.   Chest x-ray: Very poor inspiration, no signs of acute cardiopulmonary abnormality, await final read from radiology.  Patient does appear to have an enlarged heart on x-ray    Assessment & Plan:   Problem List Items Addressed This Visit    None  Visit Diagnoses    Atypical pneumonia    -  Primary   Relevant Medications   predniSONE (DELTASONE) 20 MG tablet   amoxicillin-clavulanate (AUGMENTIN) 875-125 MG tablet   methylPREDNISolone acetate (DEPO-MEDROL) injection 80 mg   cefTRIAXone (ROCEPHIN) injection 1 g (Completed)   cetirizine (ZYRTEC) 10 MG tablet   Other Relevant Orders   DG Chest 2 View   Enlarged heart       Patient's heart is enlarged on x-ray, will do echocardiogram   Relevant Orders   ECHOCARDIOGRAM COMPLETE     We will get echocardiogram because of enlarged heart on x-ray, will treat like atypical pneumonia.  Follow up plan: Return in about 2 weeks (around 10/13/2017), or if symptoms worsen or fail to improve, for Recheck breathing and heart.  Counseling provided for all of the vaccine components Orders Placed This Encounter  Procedures  . DG Chest 2 View  . ECHOCARDIOGRAM COMPLETE    Caryl Pina, MD [facility] 09/29/2017, 4:53 PM

## 2017-09-30 ENCOUNTER — Telehealth: Payer: Self-pay | Admitting: Family Medicine

## 2017-09-30 DIAGNOSIS — K649 Unspecified hemorrhoids: Secondary | ICD-10-CM | POA: Diagnosis not present

## 2017-09-30 DIAGNOSIS — K5900 Constipation, unspecified: Secondary | ICD-10-CM | POA: Diagnosis not present

## 2017-09-30 DIAGNOSIS — Z800 Family history of malignant neoplasm of digestive organs: Secondary | ICD-10-CM | POA: Diagnosis not present

## 2017-09-30 DIAGNOSIS — K9089 Other intestinal malabsorption: Secondary | ICD-10-CM | POA: Diagnosis not present

## 2017-09-30 DIAGNOSIS — K625 Hemorrhage of anus and rectum: Secondary | ICD-10-CM | POA: Diagnosis not present

## 2017-09-30 NOTE — Telephone Encounter (Signed)
Karen Potter spoke with pt

## 2017-10-04 ENCOUNTER — Ambulatory Visit (HOSPITAL_COMMUNITY)
Admission: RE | Admit: 2017-10-04 | Discharge: 2017-10-04 | Disposition: A | Discharge status: Home / Self Care | Payer: Medicare Other | Source: Ambulatory Visit | Attending: Family Medicine | Admitting: Family Medicine

## 2017-10-04 DIAGNOSIS — K219 Gastro-esophageal reflux disease without esophagitis: Secondary | ICD-10-CM | POA: Insufficient documentation

## 2017-10-04 DIAGNOSIS — G4733 Obstructive sleep apnea (adult) (pediatric): Secondary | ICD-10-CM | POA: Insufficient documentation

## 2017-10-04 DIAGNOSIS — I313 Pericardial effusion (noninflammatory): Secondary | ICD-10-CM | POA: Diagnosis not present

## 2017-10-04 DIAGNOSIS — I10 Essential (primary) hypertension: Secondary | ICD-10-CM | POA: Diagnosis not present

## 2017-10-04 DIAGNOSIS — I517 Cardiomegaly: Secondary | ICD-10-CM

## 2017-10-04 DIAGNOSIS — E119 Type 2 diabetes mellitus without complications: Secondary | ICD-10-CM | POA: Diagnosis not present

## 2017-10-04 DIAGNOSIS — Z6841 Body Mass Index (BMI) 40.0 and over, adult: Secondary | ICD-10-CM | POA: Insufficient documentation

## 2017-10-04 DIAGNOSIS — R0600 Dyspnea, unspecified: Secondary | ICD-10-CM | POA: Diagnosis not present

## 2017-10-04 DIAGNOSIS — E785 Hyperlipidemia, unspecified: Secondary | ICD-10-CM | POA: Diagnosis not present

## 2017-10-04 NOTE — Progress Notes (Signed)
*  PRELIMINARY RESULTS* Echocardiogram 2D Echocardiogram has been performed.  Samuel Germany 10/04/2017, 9:24 AM

## 2017-10-06 ENCOUNTER — Telehealth: Payer: Self-pay | Admitting: Family Medicine

## 2017-10-06 MED ORDER — FUROSEMIDE 20 MG PO TABS: 20.0000 mg | ORAL_TABLET | Freq: Every day | ORAL | 0 refills | Status: DC

## 2017-10-06 NOTE — Telephone Encounter (Signed)
I sent in Lasix for her, have her use it as needed

## 2017-10-06 NOTE — Telephone Encounter (Signed)
Pt aware.

## 2017-10-06 NOTE — Telephone Encounter (Signed)
Spoke with pt and she states she spoke with someone yesterday about her echocardiogram and that they were going to send in a diuretic for the fluid in her lungs? I can't find it in the chart. Please advise?

## 2017-10-14 DIAGNOSIS — D126 Benign neoplasm of colon, unspecified: Secondary | ICD-10-CM | POA: Diagnosis not present

## 2017-10-14 DIAGNOSIS — Z86010 Personal history of colonic polyps: Secondary | ICD-10-CM | POA: Diagnosis not present

## 2017-10-18 DIAGNOSIS — D126 Benign neoplasm of colon, unspecified: Secondary | ICD-10-CM | POA: Diagnosis not present

## 2017-10-31 ENCOUNTER — Ambulatory Visit (INDEPENDENT_AMBULATORY_CARE_PROVIDER_SITE_OTHER): Payer: Medicare Other | Admitting: Family Medicine

## 2017-10-31 ENCOUNTER — Encounter: Payer: Self-pay | Admitting: Family Medicine

## 2017-10-31 VITALS — BP 129/79 | HR 70 | Temp 97.6°F | Ht 63.0 in | Wt 258.0 lb

## 2017-10-31 DIAGNOSIS — L237 Allergic contact dermatitis due to plants, except food: Secondary | ICD-10-CM

## 2017-10-31 DIAGNOSIS — L03032 Cellulitis of left toe: Secondary | ICD-10-CM | POA: Diagnosis not present

## 2017-10-31 MED ORDER — CEPHALEXIN 500 MG PO CAPS: 500.0000 mg | ORAL_CAPSULE | Freq: Four times a day (QID) | ORAL | 0 refills | Status: AC

## 2017-10-31 MED ORDER — TRIAMCINOLONE ACETONIDE 0.1 % EX CREA: TOPICAL_CREAM | CUTANEOUS | 0 refills | Status: DC

## 2017-10-31 NOTE — Patient Instructions (Signed)
Rash A rash is a change in the color of the skin. A rash can also change the way your skin feels. There are many different conditions and factors that can cause a rash. Follow these instructions at home: Pay attention to any changes in your symptoms. Follow these instructions to help with your condition: Medicine Take or apply over-the-counter and prescription medicines only as told by your doctor. These may include:  Corticosteroid cream.  Anti-itch lotions.  Oral antihistamines.  Skin Care  Put cool compresses on the affected areas.  Try taking a bath with: ? Epsom salts. Follow the instructions on the packaging. You can get these at your local pharmacy or grocery store. ? Baking soda. Pour a small amount into the bath as told by your doctor. ? Colloidal oatmeal. Follow the instructions on the packaging. You can get this at your local pharmacy or grocery store.  Try putting baking soda paste onto your skin. Stir water into baking soda until it gets like a paste.  Do not scratch or rub your skin.  Avoid covering the rash. Make sure the rash is exposed to air as much as possible. General instructions  Avoid hot showers or baths, which can make itching worse. A cold shower may help.  Avoid scented soaps, detergents, and perfumes. Use gentle soaps, detergents, perfumes, and other cosmetic products.  Avoid anything that causes your rash. Keep a journal to help track what causes your rash. Write down: ? What you eat. ? What cosmetic products you use. ? What you drink. ? What you wear. This includes jewelry.  Keep all follow-up visits as told by your doctor. This is important. Contact a doctor if:  You sweat at night.  You lose weight.  You pee (urinate) more than normal.  You feel weak.  You throw up (vomit).  Your skin or the whites of your eyes look yellow (jaundice).  Your skin: ? Tingles. ? Is numb.  Your rash: ? Does not go away after a few days. ? Gets  worse.  You are: ? More thirsty than normal. ? More tired than normal.  You have: ? New symptoms. ? Pain in your belly (abdomen). ? A fever. ? Watery poop (diarrhea). Get help right away if:  Your rash covers all or most of your body. The rash may or may not be painful.  You have blisters that: ? Are on top of the rash. ? Grow larger. ? Grow together. ? Are painful. ? Are inside your nose or mouth.  You have a rash that: ? Looks like purple pinprick-sized spots all over your body. ? Has a "bull's eye" or looks like a target. ? Is red and painful, causes your skin to peel, and is not from being in the sun too long. This information is not intended to replace advice given to you by your health care provider. Make sure you discuss any questions you have with your health care provider. Document Released: 10/13/2007 Document Revised: 10/02/2015 Document Reviewed: 09/11/2014 Elsevier Interactive Patient Education  2018 [place] is an infection of the skin. It happens near a fingernail or toenail. It may cause pain and swelling around the nail. Usually, it is not serious and it clears up with treatment. Follow these instructions at home:  Soak the fingers or toes in warm water as told by your doctor. You may be told to do this for 20 minutes, 2-3 times a day.  Keep the area dry when you  are not soaking it.  Take medicines only as told by your doctor.  If you were given an antibiotic medicine, finish all of it even if you start to feel better.  Keep the affected area clean.  Do not try to drain a fluid-filled bump yourself.  Wear rubber gloves when putting your hands in water.  Wear gloves if your hands might touch cleaners or chemicals.  Follow your doctor's instructions about: ? Wound care. ? Bandage (dressing) changes and removal. Contact a doctor if:  Your symptoms get worse or do not improve.  You have a fever or chills.  You have  redness spreading from the affected area.  You have more fluid, blood, or pus coming from the affected area.  Your finger or knuckle is swollen or is hard to move. This information is not intended to replace advice given to you by your health care provider. Make sure you discuss any questions you have with your health care provider. Document Released: 04/14/2009 Document Revised: 10/02/2015 Document Reviewed: 04/03/2014 Elsevier Interactive Patient Education  [employer].

## 2017-10-31 NOTE — Progress Notes (Signed)
Subjective: CC: knots on arms PCP: Dettinger, Fransisca Kaufmann, MD Karen Potter is a 77 y.o. female presenting to clinic today for:  1. Rash Patient reports that she developed knots/rash on bilateral upper extremities after working in the garden.  She notes that the areas are itchy.  She has been using a topical cream that was given to her by [facility] for itching and coconut oil with little improvement in symptoms.  She denies rash elsewhere.  Does not recall coming in contact with any poison oak or poison ivy.  2.  Toe infection Patient reports that her left great toe has been oozing pus over the last couple of days.  Denies any fevers, chills.  No preceding injury.  She thinks that the cuticle got irritated as a result of the shoes that she was wearing in the garden.  Past medical history is significant for type 2 diabetes.  She notes that her blood sugars have been elevated into the 200s over the last couple of weeks.  She resumed use of Amaryl and plans to follow-up with her PCP on Monday with regards to diabetes.   ROS: Per HPI  Allergies  Allergen Reactions  . Codeine Itching  . Dilaudid [Hydromorphone] Other (See Comments)    Mouth blisters  . Other Itching    Most pain meds cause itching.  When has to take pain medication, she has been instructed to take Benadryl  . Doxycycline Rash   Past Medical History:  Diagnosis Date  . COPD, mild ([place])   . Duodenal ulcer without hemorrhage or perforation    08-29-2015 per EGD reort  . Dyspnea on exertion   . Fatty liver   . Gastric ulcer without hemorrhage or perforation    08-29-2015 per EGD report  . Gastritis    per EGD 08-29-2015  . GERD (gastroesophageal reflux disease)   . Hearing loss    no hearing aids  . 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 History  of kidney stones   . History of TIA (transient ischemic attack)    03-23-2014  w/ episode temporay amnesia  . Hyperlipidemia   . Hypertension   . Hypothyroidism   . Iron deficiency anemia   . LAFB (left anterior fascicular block)   . OA (osteoarthritis)   . OSA on CPAP    severe per study 2003  . 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 RBBB (right bundle branch block)   . Renal calculi    right side  . Seasonal allergies   . Type 2 diabetes mellitus ([place])   . Vitamin D deficiency   . Wears glasses     Current Outpatient Medications:  .  albuterol (PROVENTIL HFA;VENTOLIN HFA) 108 (90 Base) MCG/ACT inhaler, Inhale 2 puffs into the lungs every 6 (six) hours as needed for wheezing or shortness of breath., Disp: 1 Inhaler, Rfl: 0 .  aspirin 325 MG tablet, Take 325 mg by mouth every evening. , Disp: , Rfl:  .  carvedilol (COREG) 6.25 MG tablet, TAKE 1 TABLET BY MOUTH TWICE DAILY WITH MEALS, Disp: 180 tablet, Rfl: 1 .  cetirizine (ZYRTEC) 10 MG tablet, Take 1 tablet (10 mg total) by mouth daily., Disp: 30 tablet, Rfl: 11 .  Cholecalciferol (VITAMIN D3) 2000 UNITS capsule, Take 2,000 Units by mouth daily., Disp: , Rfl:  .  clobetasol ointment (TEMOVATE) 4.40 %, Apply 1 application topically 2 (two) times daily., Disp: 30 g, Rfl: 3 .  clotrimazole-betamethasone (LOTRISONE) cream, APPLY  CREAM TOPICALLY TWICE DAILY, Disp: 45 g, Rfl: 1 .  erythromycin (ROMYCIN) ophthalmic ointment, Place 1 application into both eyes 3 (three) times daily. Apply small amount in lower lid TID, Disp: 3.5 g, Rfl: 0 .  furosemide (LASIX) 20 MG tablet, Take 1 tablet (20 mg total) by mouth daily., Disp: 30 tablet, Rfl: 0 .  glimepiride (AMARYL) 2 MG tablet, Take 2 mg by mouth daily with breakfast., Disp: , Rfl:  .  levothyroxine (SYNTHROID, LEVOTHROID) 150 MCG tablet, TAKE 1 TABLET BY MOUTH ONCE DAILY, Disp: 90 tablet, Rfl:  3 .  losartan-hydrochlorothiazide (HYZAAR) 100-25 MG tablet, TAKE 1 TABLET BY MOUTH ONCE DAILY, Disp: 90 tablet, Rfl: 1 .  Magnesium 500 MG TABS, Take 500 mg by mouth daily., Disp: , Rfl:  .  Omega-3 Fatty Acids (FISH OIL TRIPLE STRENGTH) 1400 MG CAPS, Take 1,400 mg by mouth 4 (four) times daily. , Disp: , Rfl:  .  omeprazole (PRILOSEC) 20 MG capsule, Take 1 capsule (20 mg total) by mouth 2 (two) times daily before a meal., Disp: 180 capsule, Rfl: 3 .  ONE TOUCH LANCETS MISC, Patient to check BS once daily, Disp: 200 each, Rfl: 0 Social History   Socioeconomic History  . Marital status: Married    Spouse name: Not on file  . Number of children: Not on file  . Years of education: Not on file  . Highest education level: Not on file  Occupational History  . Not on file  Social Needs  . Financial resource strain: Not on file  . Food insecurity:    Worry: Not on file    Inability: Not on file  . Transportation needs:    Medical: Not on file    Non-medical: Not on file  Tobacco Use  . Smoking status: Never Smoker  . Smokeless tobacco: Never Used  Substance and Sexual Activity  . Alcohol use: No  . Drug use: No  . Sexual activity: Not on file  Lifestyle  . Physical activity:    Days per week: Not on file    Minutes per session: Not on file  . Stress: Not on file  Relationships  . Social connections:    Talks on phone: Not on file    Gets together: Not on file    Attends religious service: Not on file    Active member of club or organization: Not on file    Attends meetings of clubs or organizations: Not on file    Relationship status: Not on file  . Intimate partner violence:    Fear of current or ex partner: Not on file    Emotionally abused: Not on file    Physically abused: Not on file    Forced sexual activity: Not on file  Other Topics Concern  . Not on file  Social History Narrative  . Not on file   Family History  Problem Relation Age of Onset  . Heart failure  Mother   . Cancer Father   . Cancer Brother     Objective: Office vital signs reviewed. BP 129/79   Pulse 70   Temp  97.6 F (36.4 C)   Ht 5\' 3"  (1.6 m)   Wt 329 lb (117 kg)   BMI 45.70 kg/m   Physical Examination:  General: Awake, alert, well nourished, nontoxic, No acute distress Pulm: normal work of breathing on room air Extremities: warm, well perfused, No edema, cyanosis or clubbing; +2 pulses bilaterally MSK: normal gait and normal station Skin: Bilateral upper extremities with wheal formations.  Left cuticle of great toe with erythema, inflammation and purulent discharge draining.  No joint erythema or swelling.  Assessment/ Plan: 77 y.o. female   1. Allergic contact dermatitis due to plants, except food Rash on upper extremities appears to be a contact dermatitis, possibly due to plants given recent gardening.  Oral corticosteroid considered.  However, patient with active infection left great toe and her blood sugars have been running in the 200s.  Therefore, topical corticosteroid prescribed.  She may apply twice daily to the affected areas for the next 7 days.  She has follow-up with her PCP in 7 days.  2. Paronychia of great toe, left Actively draining purulent discharge.  Patient is afebrile.  However, she is seeing a rise in her blood sugars.  Given concomitant type 2 diabetes, she was prescribed Keflex p.o. 4 times daily for the next 5 days.  Home care instructions were reviewed with the patient.  Reasons for return discussed.  She will follow-up as needed.  Meds ordered this encounter  Medications  . cephALEXin (KEFLEX) 500 MG capsule    Sig: Take 1 capsule (500 mg total) by mouth 4 (four) times daily for 5 days.    Dispense:  20 capsule    Refill:  0  . triamcinolone cream (KENALOG) 0.1 %    Sig: Apply to rash on arms twice daily x7 days    Dispense:  80 g    Refill:  0     Karen Mallette Windell Moulding, DO [place] [phone]

## 2017-11-07 ENCOUNTER — Encounter: Payer: Self-pay | Admitting: Family Medicine

## 2017-11-07 ENCOUNTER — Ambulatory Visit (INDEPENDENT_AMBULATORY_CARE_PROVIDER_SITE_OTHER): Payer: Medicare Other | Admitting: Family Medicine

## 2017-11-07 ENCOUNTER — Ambulatory Visit (INDEPENDENT_AMBULATORY_CARE_PROVIDER_SITE_OTHER): Payer: Medicare Other

## 2017-11-07 VITALS — BP 154/98 | HR 74 | Temp 97.8°F | Ht 63.0 in | Wt 258.0 lb

## 2017-11-07 DIAGNOSIS — I152 Hypertension secondary to endocrine disorders: Secondary | ICD-10-CM

## 2017-11-07 DIAGNOSIS — R609 Edema, unspecified: Secondary | ICD-10-CM

## 2017-11-07 DIAGNOSIS — E1169 Type 2 diabetes mellitus with other specified complication: Secondary | ICD-10-CM

## 2017-11-07 DIAGNOSIS — G4733 Obstructive sleep apnea (adult) (pediatric): Secondary | ICD-10-CM

## 2017-11-07 DIAGNOSIS — E785 Hyperlipidemia, unspecified: Secondary | ICD-10-CM | POA: Diagnosis not present

## 2017-11-07 DIAGNOSIS — I10 Essential (primary) hypertension: Secondary | ICD-10-CM

## 2017-11-07 DIAGNOSIS — R0602 Shortness of breath: Secondary | ICD-10-CM

## 2017-11-07 DIAGNOSIS — E1159 Type 2 diabetes mellitus with other circulatory complications: Secondary | ICD-10-CM | POA: Diagnosis not present

## 2017-11-07 DIAGNOSIS — K219 Gastro-esophageal reflux disease without esophagitis: Secondary | ICD-10-CM

## 2017-11-07 DIAGNOSIS — E039 Hypothyroidism, unspecified: Secondary | ICD-10-CM | POA: Diagnosis not present

## 2017-11-07 DIAGNOSIS — I517 Cardiomegaly: Secondary | ICD-10-CM

## 2017-11-07 LAB — BAYER DCA HB A1C WAIVED: HB A1C (BAYER DCA - WAIVED): 7.3 % — ABNORMAL HIGH (ref <7.0)

## 2017-11-07 MED ORDER — FUROSEMIDE 20 MG PO TABS: 20.0000 mg | ORAL_TABLET | Freq: Every day | ORAL | 1 refills | Status: DC | PRN

## 2017-11-07 NOTE — Progress Notes (Signed)
BP (!) 154/98   Pulse 74   Temp 97.8 F (36.6 C) (Oral)   Ht '5\' 3"'  (1.6 m)   Wt 258 lb (117 kg)   BMI 45.70 kg/m    Subjective:    Patient ID: Karen Potter, female    DOB: 03/19/1941, 77 y.o.   MRN: [id number]  HPI: Karen Potter is a 77 y.o. female presenting on 11/07/2017 for Diabetes; Hypothyroidism; Hyperlipidemia; and Hypertension   HPI Type 2 diabetes mellitus Patient comes in today for recheck of his diabetes. Patient has been currently taking glimepiride. Patient is currently on an ACE inhibitor/ARB. Patient has not seen an ophthalmologist this year. Patient denies any issues with their feet.  Patient does have hypertension and cholesterol and obesity  Hypothyroidism recheck Patient is coming in for thyroid recheck today as well. They deny any issues with hair changes or heat or cold problems or diarrhea or constipation. They deny any chest pain or palpitations. They are currently on levothyroxine 172mcrograms   Hypertension Patient is currently on losartan-hydrochlorothiazide, and their blood pressure today is 154/98. Patient denies any lightheadedness or dizziness. Patient denies headaches, blurred vision, chest pains, shortness of breath, or weakness. Denies any side effects from medication and is content with current medication.   Hyperlipidemia Patient is coming in for recheck of his hyperlipidemia. The patient is currently taking omega-3's. They deny any issues with myalgias or history of liver damage from it. They deny any focal numbness or weakness or chest pain.   GERD Patient is currently on omeprazole.  She denies any major symptoms or abdominal pain or belching or burping. She denies any blood in her stool or lightheadedness or dizziness.   Shortness of breath and swelling in her lower legs Patient is coming in today for continued shortness of breath and swelling in the lower legs.  She does not feel like she is gotten better since last time she was seen  here at our office.  Last time she had an x-ray was ordered for an echocardiogram which did not show any major causes for this possible shortness of breath or swelling.  She did have some signs on the x-ray of pericardial effusion that is why she was sent.  The echocardiogram.  She is still feeling short of breath and feels tight in her chest and feels like she just cannot catch her breath easily.  She denies any significant coughing or fevers or chills.  She denies any significant wheezing.  She says she cannot even go short distances without getting short of breath now.  Relevant past medical, surgical, family and social history reviewed and updated as indicated. Interim medical history since our last visit reviewed. Allergies and medications reviewed and updated.  Review of Systems  Constitutional: Negative for chills and fever.  Eyes: Negative for visual disturbance.  Respiratory: Positive for shortness of breath. Negative for cough, chest tightness and wheezing.   Cardiovascular: Positive for leg swelling. Negative for chest pain.  Musculoskeletal: Negative for back pain and gait problem.  Skin: Negative for rash.  Neurological: Negative for light-headedness and headaches.  Psychiatric/Behavioral: Negative for agitation and behavioral problems.  All other systems reviewed and are negative.   Per HPI unless specifically indicated above   Allergies as of 11/07/2017      Reactions   Codeine Itching   Dilaudid [hydromorphone] Other (See Comments)   Mouth blisters   Other Itching   Most pain meds cause itching.  When has to take  pain medication, she has been instructed to take Benadryl   Doxycycline Rash      Medication List        Accurate as of 11/07/17  9:58 AM. Always use your most recent med list.          albuterol 108 (90 Base) MCG/ACT inhaler Commonly known as:  PROVENTIL HFA;VENTOLIN HFA Inhale 2 puffs into the lungs every 6 (six) hours as needed for wheezing or shortness  of breath.   aspirin 325 MG tablet Take 325 mg by mouth every evening.   carvedilol 6.25 MG tablet Commonly known as:  COREG TAKE 1 TABLET BY MOUTH TWICE DAILY WITH MEALS   cetirizine 10 MG tablet Commonly known as:  ZYRTEC Take 1 tablet (10 mg total) by mouth daily.   clobetasol ointment 0.05 % Commonly known as:  TEMOVATE Apply 1 application topically 2 (two) times daily.   clotrimazole-betamethasone cream Commonly known as:  LOTRISONE APPLY  CREAM TOPICALLY TWICE DAILY   FISH OIL TRIPLE STRENGTH 1400 MG Caps Take 1,400 mg by mouth 4 (four) times daily.   furosemide 20 MG tablet Commonly known as:  LASIX Take 1 tablet (20 mg total) by mouth daily as needed.   glimepiride 2 MG tablet Commonly known as:  AMARYL Take 2 mg by mouth daily with breakfast.   levothyroxine 150 MCG tablet Commonly known as:  SYNTHROID, LEVOTHROID TAKE 1 TABLET BY MOUTH ONCE DAILY   losartan-hydrochlorothiazide 100-25 MG tablet Commonly known as:  HYZAAR TAKE 1 TABLET BY MOUTH ONCE DAILY   Magnesium 500 MG Tabs Take 500 mg by mouth daily.   omeprazole 20 MG capsule Commonly known as:  PRILOSEC Take 1 capsule (20 mg total) by mouth 2 (two) times daily before a meal.   ONE TOUCH LANCETS Misc Patient to check BS once daily   triamcinolone cream 0.1 % Commonly known as:  KENALOG Apply to rash on arms twice daily x7 days   Vitamin D3 2000 units capsule Take 2,000 Units by mouth daily.          Objective:    BP (!) 154/98   Pulse 74   Temp 97.8 F (36.6 C) (Oral)   Ht '5\' 3"'  (1.6 m)   Wt 258 lb (117 kg)   BMI 45.70 kg/m   Wt Readings from Last 3 Encounters:  11/07/17 258 lb (117 kg)  10/31/17 258 lb (117 kg)  09/29/17 263 lb (119.3 kg)    Physical Exam  Constitutional: She is oriented to person, place, and time. She appears well-developed and well-nourished. No distress.  Eyes: Conjunctivae are normal.  Neck: Neck supple. No thyromegaly present.  Cardiovascular: Normal  rate, regular rhythm, normal heart sounds and intact distal pulses.  No murmur heard. Pulmonary/Chest: Effort normal and breath sounds normal. No respiratory distress. She has no wheezes. She has no rales.  Musculoskeletal: Normal range of motion. She exhibits edema (2+ bilateral).  Lymphadenopathy:    She has no cervical adenopathy.  Neurological: She is alert and oriented to person, place, and time. Coordination normal.  Skin: Skin is warm and dry. No rash noted. She is not diaphoretic.  Psychiatric: She has a normal mood and affect. Her behavior is normal.  Nursing note and vitals reviewed.  Chest x-ray: Shows enlarged cardiac silhouette, does not specifically compared to previous but it sounds like there is seen and is more than the previous.    Assessment & Plan:   Problem List Items Addressed This Visit  Cardiovascular and Mediastinum   Hypertension associated with diabetes ([place])   Relevant Medications   furosemide (LASIX) 20 MG tablet     Respiratory   OBSTRUCTIVE SLEEP APNEA   Relevant Orders   Ambulatory referral to Sleep Studies     Digestive   GERD (gastroesophageal reflux disease) (Chronic)     Endocrine   Hyperlipidemia associated with type 2 diabetes mellitus (HCC)   Relevant Medications   furosemide (LASIX) 20 MG tablet   Hypothyroid   Relevant Orders   TSH (Completed)   Type 2 diabetes mellitus with other specified complication (HCC) - Primary   Relevant Orders   Bayer DCA Hb A1c Waived (Completed)   BMP8+EGFR     Other   DYSPNEA   Relevant Medications   furosemide (LASIX) 20 MG tablet   Other Relevant Orders   BMP8+EGFR   DG Chest 2 View (Completed)   Morbid obesity ([place])    Other Visit Diagnoses    Peripheral edema          Patient says her shortness of breath felt a lot better when she was on the Lasix for 5 days but she feels like the swelling and shortness of breath came right back afterwards.  She is wondering if she can take it more  frequently.  Follow up plan: Return in about 3 months (around 02/07/2018), or if symptoms worsen or fail to improve, for Recheck diabetes and hyper tension and thyroid.  Counseling provided for all of the vaccine components Orders Placed This Encounter  Procedures  . DG Chest 2 View  . Bayer DCA Hb A1c Waived  . TSH  . BMP8+EGFR  . Ambulatory referral to Sleep Studies    Caryl Pina, MD [facility] 11/07/2017, 9:58 AM

## 2017-11-07 NOTE — Patient Instructions (Signed)
Patient needs to return in 4 weeks for lab recheck going to start using furosemide more frequently.

## 2017-11-08 LAB — TSH: TSH: 2.25 u[IU]/mL (ref 0.450–4.500)

## 2017-11-11 ENCOUNTER — Telehealth: Payer: Self-pay | Admitting: Family Medicine

## 2017-11-11 NOTE — Telephone Encounter (Signed)
Refer to lab note °

## 2017-11-18 DIAGNOSIS — H52223 Regular astigmatism, bilateral: Secondary | ICD-10-CM | POA: Diagnosis not present

## 2017-11-18 DIAGNOSIS — E119 Type 2 diabetes mellitus without complications: Secondary | ICD-10-CM | POA: Diagnosis not present

## 2017-11-28 ENCOUNTER — Telehealth: Payer: Self-pay | Admitting: Family Medicine

## 2017-11-28 ENCOUNTER — Other Ambulatory Visit: Payer: Medicare Other

## 2017-11-28 DIAGNOSIS — R0602 Shortness of breath: Secondary | ICD-10-CM

## 2017-11-28 DIAGNOSIS — E1169 Type 2 diabetes mellitus with other specified complication: Secondary | ICD-10-CM | POA: Diagnosis not present

## 2017-11-28 NOTE — Telephone Encounter (Signed)
Patient aware she does not need to fast.

## 2017-11-29 LAB — BMP8+EGFR
BUN/Creatinine Ratio: 21 (ref 12–28)
BUN: 16 mg/dL (ref 8–27)
CO2: 27 mmol/L (ref 20–29)
Calcium: 9.3 mg/dL (ref 8.7–10.3)
Chloride: 102 mmol/L (ref 96–106)
Creatinine, Ser: 0.78 mg/dL (ref 0.57–1.00)
GFR calc Af Amer: 86 mL/min/{1.73_m2} (ref >59)
GFR calc non Af Amer: 75 mL/min/{1.73_m2} (ref >59)
Glucose: 123 mg/dL — ABNORMAL HIGH (ref 65–99)
Potassium: 4.1 mmol/L (ref 3.5–5.2)
Sodium: 145 mmol/L — ABNORMAL HIGH (ref 134–144)

## 2017-12-01 ENCOUNTER — Other Ambulatory Visit: Payer: Self-pay | Admitting: Gastroenterology

## 2017-12-01 ENCOUNTER — Ambulatory Visit
Admission: RE | Admit: 2017-12-01 | Discharge: 2017-12-01 | Disposition: A | Discharge status: Home / Self Care | Payer: Medicare Other | Source: Ambulatory Visit | Attending: Gastroenterology | Admitting: Gastroenterology

## 2017-12-01 DIAGNOSIS — K5900 Constipation, unspecified: Secondary | ICD-10-CM

## 2017-12-01 DIAGNOSIS — R109 Unspecified abdominal pain: Secondary | ICD-10-CM | POA: Diagnosis not present

## 2017-12-24 ENCOUNTER — Other Ambulatory Visit: Payer: Self-pay | Admitting: Family Medicine

## 2018-01-10 ENCOUNTER — Ambulatory Visit: Payer: Medicare Other | Admitting: Neurology

## 2018-01-10 VITALS — BP 148/79 | HR 83 | Ht 64.5 in | Wt 258.5 lb

## 2018-01-10 DIAGNOSIS — R351 Nocturia: Secondary | ICD-10-CM | POA: Diagnosis not present

## 2018-01-10 DIAGNOSIS — G4733 Obstructive sleep apnea (adult) (pediatric): Secondary | ICD-10-CM | POA: Diagnosis not present

## 2018-01-10 DIAGNOSIS — G4719 Other hypersomnia: Secondary | ICD-10-CM | POA: Diagnosis not present

## 2018-01-10 DIAGNOSIS — Z9989 Dependence on other enabling machines and devices: Secondary | ICD-10-CM

## 2018-01-10 DIAGNOSIS — Z6841 Body Mass Index (BMI) 40.0 and over, adult: Secondary | ICD-10-CM

## 2018-01-10 DIAGNOSIS — R635 Abnormal weight gain: Secondary | ICD-10-CM

## 2018-01-10 NOTE — Progress Notes (Signed)
Subjective:    Patient ID: Karen Potter is a 77 y.o. female.  HPI     [name], MD, PhD [facility] [address], [address] [place], [place] [address]   Dear Dr. Warrick Parisian,   I saw your patient, [name], upon your kind request, in my clinic today for initial consultation of her sleep disorder, in particular, reevaluation of her prior diagnosis of obstructive sleep apnea. The patient is accompanied by her husband today. As you know, [name] is a 77 year old right-handed woman with an underlying complex medical history of type 2 diabetes, right bundle branch block, history of polio, osteoarthritis, hypothyroidism, hypertension, hyperlipidemia, history of hepatitis B, history of TIA, reflux disease, seasonal allergies, peripheral edema, type 2 diabetes, and morbid obesity with BMI of over 40, who was previously diagnosed with obstructive sleep apnea and placed on CPAP therapy. She had sleep study testing in June 2003 with severe obstructive sleep apnea diagnosed with a baseline AHI of 92 per hour. She has been on CPAP. A compliance download was not possible today as she has an older machine. I reviewed your office note from 11/07/2017. Her Epworth sleepiness score is 15 out of 24, fatigue score is 60 out of 63. She complains of mouth dryness. 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 They have 4 children, 2 are adopted. She is a nonsmoker and does not typically utilize alcohol, she drinks caffeine in the form of soda, 2 to 3 bottles per day on average. She has had SOB with minimal exertion since April. She has been on antibiotics and has had a steroid shot. She has had chest x-ray. Her chest x-ray from 11/07/2017 showed mildly enlarged cardiac silhouette with mild bibasilar atelectasis. She reports being fully compliant with CPAP therapy, pressure is at 16, she does not like the ramp feature. She uses  humidification and nasal mask which seems to fit well. She would not be opposed to retesting for sleep apnea. She does not feel that her shortness of breath during the day is related to her sleep apnea as she is fully compliant with treatment. Nevertheless, her Epworth sleepiness score is elevated. She also reports not always making enough time for sleep. She goes to bed around 2 and rise time varies. Sometimes she is up by 6 if she needs to and sometimes she may sleep until 10. She has nocturia about once per average night and denies morning headaches. She does not have a close family member with sleep apnea but some distant cousins. She had exposure to secondhand smoke when she was a child and also before her husband quit smoking. She had a tonsillectomy as a child. Her weight at the time of her sleep study in 2003 was 220, current weight of 258.  Her Past Medical History Is Significant For: Past Medical History:  Diagnosis Date  . COPD, mild ([place])   . Duodenal ulcer without hemorrhage or perforation    08-29-2015 per EGD reort  . Dyspnea on exertion   . Fatty liver   . Gastric ulcer without hemorrhage or perforation    08-29-2015 per EGD report  . Gastritis    per EGD 08-29-2015  . GERD (gastroesophageal reflux disease)   . Hearing loss    no hearing aids  . 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 History of hepatitis B ?  B   age 53--  pt states was quarantined   no treatment ;  per pt no symptoms or issues since  . History of kidney stones   . History of TIA (transient ischemic attack)    03-23-2014  w/ episode temporay amnesia  . Hyperlipidemia   . Hypertension   . Hypothyroidism   . Iron deficiency anemia   . LAFB (left anterior fascicular block)   . OA (osteoarthritis)   . OSA on CPAP    severe per study 2003  . 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 Post-polio limb muscle weakness    left leg  w/ 3  repair surgery's  . RBBB (right bundle branch block)   . Renal calculi    right side  . Seasonal allergies   . Type 2 diabetes mellitus ([place])   . Vitamin D deficiency   . Wears glasses     Her Past Surgical History Is Significant For: Past Surgical History:  Procedure Laterality Date  . CARPAL TUNNEL RELEASE Right 05-10-2007  . CHOLECYSTECTOMY OPEN  1985   and Appendectomy  . CYSTO/  RIGHT URETEROSCOPIC STONE EXTRACTON/  STENT PLACEMENT  06/ 2016   in [place]  . CYSTOSCOPY/RETROGRADE/URETEROSCOPY/STONE EXTRACTION WITH BASKET Right 11/17/2015   Procedure: CYSTOSCOPY/RETROGRADE PYELOGRAM RIGHT/DIAGNOSTIC RIGHT URETEROSCOPY/LASER RENAL CALCIFICATION/RIGHT STENT PLACEMENT;  Surgeon: Cleon Gustin, MD;  Location: [facility];  Service: Urology;  Laterality: Right;  . ESOPHAGOGASTRODUODENOSCOPY N/A 08/29/2015   Procedure: ESOPHAGOGASTRODUODENOSCOPY (EGD);  Surgeon: Danie Binder, MD;  Location: [facility];  Service: Endoscopy;  Laterality: N/A;  . HOLMIUM LASER APPLICATION Right 1/44/3154   Procedure: HOLMIUM LASER APPLICATION;  Surgeon: Cleon Gustin, MD;  Location: [facility];  Service: Urology;  Laterality: Right;  . KNEE ARTHROSCOPY Right 1999  . LEFT HEEL REPAIR SURGERY  1954;  1985;  1995   polio  . LUMBAR SPINE SURGERY  09-01-2010   L4 -L5 fusion,  L3 laminectomy,  L5 - S1 foraminotomy  . OVARIAN CYST SURGERY Right 1966  . PARATHYROIDECTOMY N/A 05/06/2015   Procedure: PARATHYROIDECTOMY;  Surgeon: Armandina Gemma, MD;  Location: [facility];  Service: General;  Laterality: N/A;   left superior  . TONSILLECTOMY  as child  . TOTAL ABDOMINAL HYSTERECTOMY W/ BILATERAL SALPINGOOPHORECTOMY  1984  . TOTAL KNEE ARTHROPLASTY Right 04-08-2008  . TRANSTHORACIC ECHOCARDIOGRAM  04-07-2012   grade 1 diastolic function,  ef 00-86%/  mild AV calcification without stenosis/  trivial MR    Her Family History Is Significant For: Family History  Problem Relation  Age of Onset  . Heart failure Mother   . Cancer Father   . Cancer Brother     Her Social History Is Significant For: Social History   Socioeconomic History  . Marital status: Married    Spouse name: Not on file  . Number of children: Not on file  . Years of education: Not on file  . Highest education level: Not on file  Occupational History  . Not on file  Social Needs  . Financial resource strain: Not on file  . Food insecurity:    Worry: Not on file    Inability: Not on file  . Transportation needs:    Medical: Not on file    Non-medical: Not on file  Tobacco Use  . Smoking status: Never Smoker  . Smokeless tobacco: Never Used  Substance and Sexual Activity  . Alcohol  use: No  . Drug use: No  . Sexual activity: Not on file  Lifestyle  . Physical activity:    Days per week: Not on file    Minutes per session: Not on file  . Stress: Not on file  Relationships  . Social connections:    Talks on phone: Not on file    Gets together: Not on file    Attends religious service: Not on file    Active member of club or organization: Not on file    Attends meetings of clubs or organizations: Not on file    Relationship status: Not on file  Other Topics Concern  . Not on file  Social History Narrative  . Not on file    Her Allergies Are:  Allergies  Allergen Reactions  . Codeine Itching  . Dilaudid [Hydromorphone] Other (See Comments)    Mouth blisters  . Other Itching    Most pain meds cause itching.  When has to take pain medication, she has been instructed to take Benadryl  . Doxycycline Rash  :   Her Current Medications Are:  Outpatient Encounter Medications as of 01/10/2018  Medication Sig  . aspirin 325 MG tablet Take 325 mg by mouth every evening.   . calcium carbonate (CALCIUM 600) 600 MG TABS tablet Take 600 mg by mouth 2 (two) times daily with a meal.  . carvedilol (COREG) 6.25 MG tablet TAKE 1 TABLET BY MOUTH TWICE DAILY WITH MEALS  . cetirizine  (ZYRTEC) 10 MG tablet Take 1 tablet (10 mg total) by mouth daily.  . Cholecalciferol (VITAMIN D3) 2000 UNITS capsule Take 2,000 Units by mouth 2 (two) times daily.   . clobetasol ointment (TEMOVATE) 4.03 % Apply 1 application topically as needed.  . clotrimazole-betamethasone (LOTRISONE) cream Apply 1 application topically as needed.  . furosemide (LASIX) 20 MG tablet Take 20 mg by mouth every other day.  Marland Kitchen glimepiride (AMARYL) 2 MG tablet Take 2 mg by mouth daily with breakfast.  . levothyroxine (SYNTHROID, LEVOTHROID) 150 MCG tablet TAKE 1 TABLET BY MOUTH ONCE DAILY  . losartan-hydrochlorothiazide (HYZAAR) 100-25 MG tablet TAKE 1 TABLET BY MOUTH ONCE DAILY  . Magnesium 250 MG TABS Take 250 mg by mouth 2 (two) times daily.  . Omega-3 Fatty Acids (FISH OIL TRIPLE STRENGTH) 1400 MG CAPS Take 1,400 mg by mouth 4 (four) times daily.   Marland Kitchen omeprazole (PRILOSEC) 20 MG capsule Take 1 capsule (20 mg total) by mouth 2 (two) times daily before a meal.  . ONE TOUCH LANCETS MISC Patient to check BS once daily  . [DISCONTINUED] albuterol (PROVENTIL HFA;VENTOLIN HFA) 108 (90 Base) MCG/ACT inhaler Inhale 2 puffs into the lungs every 6 (six) hours as needed for wheezing or shortness of breath.  . [DISCONTINUED] clobetasol ointment (TEMOVATE) 4.74 % Apply 1 application topically 2 (two) times daily.  . [DISCONTINUED] clotrimazole-betamethasone (LOTRISONE) cream APPLY  CREAM TOPICALLY TWICE DAILY  . [DISCONTINUED] furosemide (LASIX) 20 MG tablet Take 1 tablet (20 mg total) by mouth daily as needed.  . [DISCONTINUED] Magnesium 500 MG TABS Take 500 mg by mouth daily.  . [DISCONTINUED] triamcinolone cream (KENALOG) 0.1 % Apply to rash on arms twice daily x7 days   No facility-administered encounter medications on file as of 01/10/2018.   :  Review of Systems:  Out of a complete 14 point review of systems, all are reviewed and negative with the exception of these symptoms as listed below: Review of Systems   Neurological:  Pt here with her husband as a new patient. Pt mentioned that she has been using her current CPAP machine for 15 years. Pt uses [employer]. Unable to download due to the model of the CPAP.   Epworth Sleepiness Scale 0= would never doze 1= slight chance of dozing 2= moderate chance of dozing 3= high chance of dozing  Sitting and reading:2 Watching TV:3 Sitting inactive in a public place (ex. Theater or meeting):2 As a passenger in a car for an hour without a break:3 Lying down to rest in the afternoon:3 Sitting and talking to someone:0 Sitting quietly after lunch (no alcohol):2 In a car, while stopped in traffic:0 Total:15     Objective:  Neurological Exam  Physical Exam Physical Examination:   Vitals:   01/10/18 1328  BP: (!) 148/79  Pulse: 83    General Examination: The patient is a very pleasant 77 y.o. female in no acute distress. She appears well-developed and well-nourished and well groomed.   HEENT: Normocephalic, atraumatic, pupils are equal, round and reactive to light and accommodation. corrective eyeglasses in place. Extraocular tracking is unremarkable. Hearing is grossly intact. Face is symmetric with normal facial animation, speech is without dysarthria. Slightly hoarse sounding. She has no lip, neck or jaw tremor, neck circumference is 18 inches. Airway examination reveals moderate mouth dryness and moderate airway crowding secondary to thicker soft palate and smaller airway entry. Tonsils are absent. Mallampati is class III. Tongue protrudes centrally and palate elevates symmetrically.   Chest: Clear to auscultation without wheezing, rhonchi or crackles noted.  Heart: S1+S2+0, regular and normal without murmurs, rubs or gallops noted.   Abdomen: Soft, non-tender and non-distended with normal bowel sounds appreciated on auscultation.  Extremities: There is trace pitting edema in the distal lower extremities bilaterally. Left distal leg is  smaller in caliber. She reports having had polio as a child on the left side.  Skin: Warm and dry without trophic changes noted.  Musculoskeletal: exam reveals no obvious joint deformities, tenderness or joint swelling or erythema.   Neurologically:  Mental status: The patient is awake, alert and oriented in all 4 spheres. Her immediate and remote memory, attention, language skills and fund of knowledge are appropriate. There is no evidence of aphasia, agnosia, apraxia or anomia. Speech is clear with normal prosody and enunciation. Thought process is linear. Mood is normal and affect is normal.  Cranial nerves II - XII are as described above under HEENT exam. In addition: shoulder shrug is normal with equal shoulder height noted. Motor exam: Normal bulk (except L distal leg), strength and tone is noted. There is no drift, tremor or rebound. Romberg is negative. Fine motor skills and coordination: grossly intact.  Cerebellar testing: No dysmetria or intention tremor on finger.  Sensory exam: intact to light touch.  Gait, station and balance: She stands with mild difficulty and pushes herself up. She stands naturally slightly wide-based. She walks without a limp.  Assessment and Plan:  In summary, PAULETT KAUFHOLD is a very pleasant 77 y.o.-year old female with an underlying complex medical history of type 2 diabetes, right bundle branch block, history of polio, osteoarthritis, hypothyroidism, hypertension, hyperlipidemia, history of hepatitis B, history of TIA, reflux disease, seasonal allergies, peripheral edema, type 2 diabetes, and morbid obesity with BMI of over 40, who presents for evaluation of her obstructive sleep apnea. She was diagnosed with severe sleep apnea over 15 years ago. She has been on CPAP therapy and reports being compliant with treatment. Her pressure  has been 16 cm via nasal mask. She has in the past few months developed daytime shortness of breath and had workup for this. She  would benefit from reevaluation of her sleep apnea and upgrading her equipment. She did have significant weight gain in the past few years.  I had a long chat with the patient about my findings and the diagnosis of OSA, its prognosis and treatment options. We talked about medical treatments, surgical interventions and non-pharmacological approaches. I explained in particular the risks and ramifications of untreated moderate to severe OSA, especially with respect to developing cardiovascular disease down the Road, including congestive heart failure, difficult to treat hypertension, cardiac arrhythmias, or stroke. Even type 2 diabetes has, in part, been linked to untreated OSA. Symptoms of untreated OSA include daytime sleepiness, memory problems, mood irritability and mood disorder such as depression and anxiety, lack of energy, as well as recurrent headaches, especially morning headaches. We talked about trying to maintain a healthy lifestyle in general, as well as the importance of weight control. I encouraged the patient to eat healthy, exercise daily and keep well hydrated, to keep a scheduled bedtime and wake time routine, to not skip any meals and eat healthy snacks in between meals. I advised the patient not to drive when feeling sleepy. I recommended the following at this time: sleep study with potential positive airway pressure titration. (We will score hypopneas at 4%).   I explained the importance of being compliant with PAP treatment, not only for insurance purposes but primarily to improve Her symptoms, and for the patient's long term health benefit, including to reduce Her cardiovascular risks. I encouraged her to make enough time for sleep and stay better hydrated with water, reduce her cola intake/soda. I answered all their questions today and the patient and her husband were in agreement. I would like to see her back after the sleep study is completed and encouraged them to call with any interim  questions, concerns, problems or updates.   Thank you very much for allowing me to participate in the care of this nice patient. If I can be of any further assistance to you please do not hesitate to call me at [phone].  Sincerely,   [name], MD, PhD

## 2018-01-10 NOTE — Patient Instructions (Signed)

## 2018-01-16 ENCOUNTER — Ambulatory Visit (INDEPENDENT_AMBULATORY_CARE_PROVIDER_SITE_OTHER): Payer: Medicare Other | Admitting: Neurology

## 2018-01-16 DIAGNOSIS — G4733 Obstructive sleep apnea (adult) (pediatric): Secondary | ICD-10-CM

## 2018-01-16 DIAGNOSIS — G4720 Circadian rhythm sleep disorder, unspecified type: Secondary | ICD-10-CM

## 2018-01-16 DIAGNOSIS — Z6841 Body Mass Index (BMI) 40.0 and over, adult: Secondary | ICD-10-CM

## 2018-01-16 DIAGNOSIS — R351 Nocturia: Secondary | ICD-10-CM

## 2018-01-16 DIAGNOSIS — Z9989 Dependence on other enabling machines and devices: Secondary | ICD-10-CM

## 2018-01-16 DIAGNOSIS — R635 Abnormal weight gain: Secondary | ICD-10-CM

## 2018-01-16 DIAGNOSIS — R9431 Abnormal electrocardiogram [ECG] [EKG]: Secondary | ICD-10-CM

## 2018-01-16 DIAGNOSIS — G4719 Other hypersomnia: Secondary | ICD-10-CM

## 2018-01-16 NOTE — Progress Notes (Signed)
Cardiology Office Note   Date:  01/18/2018   ID:  Karen Potter 1941/01/22, MRN [id number]  PCP:  Dettinger, Fransisca Kaufmann, MD  Cardiologist:   No primary care provider on file. Referring:  Dettinger, Fransisca Kaufmann, MD  Chief Complaint  Patient presents with  . Shortness of Breath      History of Present Illness: Karen Potter is a 77 y.o. female who presents for evaluation of shortness of breath.  The patient did have an echo in May with a small pericardial effusion but normal EF.  This was done to evaluate cardiomegaly and cough.   She been having a cough since April and was treated with antibiotics and steroids and actually improved.  However, somewhere around May she started having increasing shortness of breath.  She is always had some dyspnea and walks around the grocery store leaning on a cart.  However, she is now short of breath walking up the stairs in her home and can only make it halfway up at times without being breathless.  She is short of breath walking short distance on level ground.  She wears CPAP at night and does not describe PND or orthopnea.  She thinks she is had a little left leg swelling but she is not reporting any weight gain or abdominal swelling.  She does have baseline morbid obesity.  She denies any chest pressure, neck or arm discomfort.  She is not having any new palpitations, presyncope or syncope.  Of note she had the echocardiogram with a small pericardial effusion but no significant valvular abnormalities and a normal ejection fraction.  She had a follow-up chest x-ray a couple of months later that suggested increased cardiac silhouette size but there was no vascular congestion reported.   Past Medical History:  Diagnosis Date  . Duodenal ulcer without hemorrhage or perforation    08-29-2015 per EGD reort  . Fatty liver   . Gastric ulcer without hemorrhage or perforation    08-29-2015 per EGD report  . Gastritis    per EGD 08-29-2015  . GERD  (gastroesophageal reflux disease)   . Hearing loss    no hearing aids  . 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 History of kidney stones   . History of TIA (transient ischemic attack)    03-23-2014  w/ episode temporay amnesia  . Hyperlipidemia   . Hypertension   . Hypothyroidism   . Iron deficiency anemia   . LAFB (left anterior fascicular block)   . OA (osteoarthritis)   . OSA on CPAP    severe per study 2003  . 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 RBBB (right bundle branch block)   . Seasonal allergies   . Type 2 diabetes mellitus ([place])   . Vitamin D deficiency     Past Surgical History:  Procedure Laterality Date  . CARPAL TUNNEL RELEASE Right 05-10-2007  . CHOLECYSTECTOMY OPEN  1985   and Appendectomy  . CYSTO/  RIGHT URETEROSCOPIC STONE EXTRACTON/  STENT PLACEMENT  06/ 2016   in [place]  . CYSTOSCOPY/RETROGRADE/URETEROSCOPY/STONE EXTRACTION WITH BASKET Right 11/17/2015  Procedure: CYSTOSCOPY/RETROGRADE PYELOGRAM RIGHT/DIAGNOSTIC RIGHT URETEROSCOPY/LASER RENAL CALCIFICATION/RIGHT STENT PLACEMENT;  Surgeon: Cleon Gustin, MD;  Location: [facility];  Service: Urology;  Laterality: Right;  . ESOPHAGOGASTRODUODENOSCOPY N/A 08/29/2015   Procedure: ESOPHAGOGASTRODUODENOSCOPY (EGD);  Surgeon: Danie Binder, MD;  [facility];  Service: Endoscopy;  Laterality: N/A;  . HOLMIUM LASER APPLICATION Right 11/26/9468   Procedure: HOLMIUM LASER APPLICATION;  Surgeon: Cleon Gustin, MD;  Location: [facility];  Service: Urology;  Laterality: Right;  . KNEE ARTHROSCOPY Right 1999  . LEFT HEEL REPAIR SURGERY  1954;  1985;  1995   polio  . LUMBAR SPINE SURGERY  09-01-2010    L4 -L5 fusion,  L3 laminectomy,  L5 - S1 foraminotomy  . OVARIAN CYST SURGERY Right 1966  . PARATHYROIDECTOMY N/A 05/06/2015   Procedure: PARATHYROIDECTOMY;  Surgeon: Armandina Gemma, MD;  Location: [facility];  Service: General;  Laterality: N/A;   left superior  . TONSILLECTOMY  as child  . TOTAL ABDOMINAL HYSTERECTOMY W/ BILATERAL SALPINGOOPHORECTOMY  1984  . TOTAL KNEE ARTHROPLASTY Right 04-08-2008  . TRANSTHORACIC ECHOCARDIOGRAM  04-07-2012   grade 1 diastolic function,  ef 96-28%/  mild AV calcification without stenosis/  trivial MR     Current Outpatient Medications  Medication Sig Dispense Refill  . aspirin 325 MG tablet Take 325 mg by mouth every evening.     . calcium carbonate (CALCIUM 600) 600 MG TABS tablet Take 600 mg by mouth 2 (two) times daily with a meal.    . carvedilol (COREG) 6.25 MG tablet TAKE 1 TABLET BY MOUTH TWICE DAILY WITH MEALS 180 tablet 1  . Cholecalciferol (VITAMIN D3) 2000 UNITS capsule Take 2,000 Units by mouth 2 (two) times daily.     . clobetasol ointment (TEMOVATE) 3.66 % Apply 1 application topically as needed.    . clotrimazole-betamethasone (LOTRISONE) cream Apply 1 application topically as needed.    . furosemide (LASIX) 20 MG tablet Take 20 mg by mouth every other day.    Marland Kitchen glimepiride (AMARYL) 2 MG tablet Take 2 mg by mouth daily with breakfast.    . hydrocortisone (ANUSOL-HC) 2.5 % rectal cream Place 1 application rectally 2 (two) times daily.    Marland Kitchen levothyroxine (SYNTHROID, LEVOTHROID) 150 MCG tablet TAKE 1 TABLET BY MOUTH ONCE DAILY 90 tablet 3  . losartan-hydrochlorothiazide (HYZAAR) 100-25 MG tablet TAKE 1 TABLET BY MOUTH ONCE DAILY 90 tablet 1  . Magnesium 250 MG TABS Take 250 mg by mouth 2 (two) times daily.    . Omega-3 Fatty Acids (FISH OIL TRIPLE STRENGTH) 1400 MG CAPS Take 1,400 mg by mouth 4 (four) times daily.     Marland Kitchen omeprazole (PRILOSEC) 20 MG capsule Take 1 capsule (20 mg total) by mouth 2 (two) times daily before a meal. 180 capsule 3  .  cetirizine (ZYRTEC) 10 MG tablet Take 1 tablet (10 mg total) by mouth daily. (Patient not taking: Reported on 01/18/2018) 30 tablet 11  . ONE TOUCH LANCETS MISC Patient to check BS once daily 200 each 0   No current facility-administered medications for this visit.     Allergies:   Codeine; Dilaudid [hydromorphone]; Other; and Doxycycline    Social History:  The patient  reports that she has never smoked. She has never used smokeless tobacco. She reports that she does not drink alcohol or use drugs.   Family History:  The patient's family history includes Cancer in her brother and father; Heart failure in her mother.  ROS:  Please see the history of present illness.   Otherwise, review of systems are positive for none.   All other systems are reviewed and negative.    PHYSICAL EXAM: VS:  BP 130/78   Pulse 76   Ht 5\' 4"  (1.626 m)   Wt 257 lb (116.6 kg)   BMI 44.11 kg/m  , BMI Body mass index is 44.11 kg/m. GENERAL:  Well appearing HEENT:  Pupils equal round and reactive, fundi not visualized, oral mucosa unremarkable NECK:  No jugular venous distention, waveform within normal limits, carotid upstroke brisk and symmetric, no bruits, no thyromegaly LYMPHATICS:  No cervical, inguinal adenopathy LUNGS:  Clear to auscultation bilaterally BACK:  No CVA tenderness CHEST:  Unremarkable HEART:  PMI not displaced or sustained,S1 and S2 within normal limits, no S3, no S4, no clicks, no rubs, no murmurs ABD:  Flat, positive bowel sounds normal in frequency in pitch, no bruits, no rebound, no guarding, no midline pulsatile mass, no hepatomegaly, no splenomegaly EXT:  2 plus pulses throughout, no edema, no cyanosis no clubbing SKIN:  No rashes no nodules NEURO:  Cranial nerves II through XII grossly intact, motor grossly intact throughout PSYCH:  Cognitively intact, oriented to person place and time    EKG:  EKG is ordered today. The ekg ordered today demonstrates sinus rhythm with short  PR interval, right bundle branch block, left anterior fascicular block   Recent Labs: 08/04/2017: ALT 34; Hemoglobin 13.5; Platelets 288 11/07/2017: TSH 2.250 11/28/2017: BUN 16; Creatinine, Ser 0.78; Potassium 4.1; Sodium 145    Lipid Panel    Component Value Date/Time   CHOL 148 08/04/2017 1133   CHOL 129 10/03/2012 1310   TRIG 171 (H) 08/04/2017 1133   TRIG 181 (H) 04/10/2013 1206   TRIG 264 (H) 10/03/2012 1310   HDL 40 08/04/2017 1133   HDL 39 (L) 04/10/2013 1206   HDL 29 (L) 10/03/2012 1310   CHOLHDL 3.7 08/04/2017 1133   CHOLHDL 3.5 04/08/2012 0426   VLDL 33 04/08/2012 0426   LDLCALC 74 08/04/2017 1133   LDLCALC 37 04/10/2013 1206   LDLCALC 47 10/03/2012 1310      Wt Readings from Last 3 Encounters:  01/18/18 257 lb (116.6 kg)  01/10/18 258 lb 8 oz (117.3 kg)  11/07/17 258 lb (117 kg)      Other studies Reviewed: Additional studies/ records that were reviewed today include:   Echocardiogram.  Images personally reviewed.. Review of the above records demonstrates:  Please see elsewhere in the note.     ASSESSMENT AND PLAN:  PERICARDIAL EFFUSION: Given the dyspnea and the increase in cardiac silhouette she will need a repeat echocardiogram simply to quantify the amount of pericardial effusion.  She will also need a BNP level.  DOE: Walking her around the office today she was noticeably short of breath with upper airway wheezing.  However, her oxygen saturation stayed in the mid to high 90s.  Her heart rate responded to the pulse going up to 100.  Her breathlessness and wheezing with proportion to objective findings.  I will have a low threshold for perfusion imaging based on the results of the above.   Provided that she has no arrhythmic, pericardial effusion, ischemic or heart failure diagnosis then I would consider primary pulmonary work-up with the default diagnosis being weight and deconditioning which is a possibility.  ABNORMAL EKG:   Based on the results of the  above I will consider a Holter monitor to judge chronotropic competence  although she seemed to demonstrate this in the office and I doubt that bradycardia arrhythmias contributing to her symptoms.  Current medicines are reviewed at length with the patient today.  The patient does not have concerns regarding medicines.  The following changes have been made:  no change  Labs/ tests ordered today include:   Orders Placed This Encounter  Procedures  . Pro b natriuretic peptide  . EKG 12-Lead  . ECHOCARDIOGRAM COMPLETE     Disposition:   FU with me after the above testing.      Signed, Minus Breeding, MD  01/18/2018 12:05 PM    [place]

## 2018-01-18 ENCOUNTER — Encounter: Payer: Self-pay | Admitting: Cardiology

## 2018-01-18 ENCOUNTER — Ambulatory Visit: Payer: Medicare Other | Admitting: Cardiology

## 2018-01-18 ENCOUNTER — Other Ambulatory Visit: Payer: Medicare Other

## 2018-01-18 VITALS — BP 130/78 | HR 76 | Ht 64.0 in | Wt 257.0 lb

## 2018-01-18 DIAGNOSIS — R9431 Abnormal electrocardiogram [ECG] [EKG]: Secondary | ICD-10-CM | POA: Diagnosis not present

## 2018-01-18 DIAGNOSIS — R0602 Shortness of breath: Secondary | ICD-10-CM

## 2018-01-18 NOTE — Patient Instructions (Signed)
Medication Instructions:  The current medical regimen is effective;  continue present plan and medications.  Labwork: Please have blood work today at [facility] (pro-BNP)  Testing/Procedures: Your physician has requested that you have an echocardiogram. Echocardiography is a painless test that uses sound waves to create images of your heart. It provides your doctor with information about the size and shape of your heart and how well your heart's chambers and valves are working. This procedure takes approximately one hour. There are no restrictions for this procedure.  Follow-Up: Follow up after the above testing has been completed.  If you need a refill on your cardiac medications before your next appointment, please call your pharmacy.  Thank you for choosing [facility]!!

## 2018-01-19 ENCOUNTER — Telehealth: Payer: Self-pay

## 2018-01-19 LAB — PRO B NATRIURETIC PEPTIDE: NT-Pro BNP: 68 pg/mL (ref 0–738)

## 2018-01-19 NOTE — Progress Notes (Signed)
Patient referred by Dr. Warrick Parisian, seen by me on 01/10/18, diagnostic PSG on 01/16/18.   Please call and notify the patient that the recent sleep study showed severe obstructive sleep apnea (she did not sleep well - not used to sleeping without CPAP in years). I recommend treatment with CPAP, but she did not have a split study, as she slept so poorly, therefore, we need a repeat sleep study for proper PAP titration and mask fitting and correct monitoring of the oxygen saturations. At least, she can sleep with CPAP that night! I will ask the tech to start at a pressure that is comfortable, not too low. Please explain to patient. I have placed an order in the chart. Thanks.  [name], MD, PhD [facility])

## 2018-01-19 NOTE — Telephone Encounter (Signed)
Pt returned my call. I advised pt that Dr. Rexene Alberts reviewed their sleep study results and found that pthas severe osa but did not sleep well and recommends that pt be treated with a cpap. Dr. Rexene Alberts recommends that pt return for a repeat sleep study in order to properly titrate the cpap and ensure a good mask fit. Pt is agreeable to returning for a titration study. I advised pt that our sleep lab will file with pt's insurance and call pt to schedule the sleep study when we hear back from the pt's insurance regarding coverage of this sleep study. Pt verbalized understanding of results. Pt had no questions at this time but was encouraged to call back if questions arise.  Pt is very concerned about the cost of the next sleep study and the cost of a new cpap. I asked her to let us know if she decides that she cannot complete the next sleep study. Pt verbalized understanding.

## 2018-01-19 NOTE — Addendum Note (Signed)
Addended by: [name] on: 01/19/2018 09:07 AM   Modules accepted: Orders

## 2018-01-19 NOTE — Telephone Encounter (Signed)
-----   Message from [name], MD sent at 01/19/2018  9:07 AM EDT ----- Patient referred by Dr. Warrick Parisian, seen by me on 01/10/18, diagnostic PSG on 01/16/18.   Please call and notify the patient that the recent sleep study showed severe obstructive sleep apnea (she did not sleep well - not used to sleeping without CPAP in years). I recommend treatment with CPAP, but she did not have a split study, as she slept so poorly, therefore, we need a repeat sleep study for proper PAP titration and mask fitting and correct monitoring of the oxygen saturations. At least, she can sleep with CPAP that night! I will ask the tech to start at a pressure that is comfortable, not too low. Please explain to patient. I have placed an order in the chart. Thanks.  [name], MD, PhD [facility])

## 2018-01-19 NOTE — Telephone Encounter (Signed)
I called pt to discuss her sleep study results. No answer, no VM set up. Will try again later.

## 2018-01-19 NOTE — Procedures (Signed)
PATIENT'S NAME:  Karen Potter, Karen Potter DOB:      03-25-1941      MR#:    [id number]     DATE OF RECORDING: 01/16/2018 REFERRING M.D.:  Caryl Pina, MD Study Performed:   Baseline Polysomnogram HISTORY: 77 year old woman with a history of type 2 diabetes, right bundle branch block, history of polio, osteoarthritis, hypothyroidism, hypertension, hyperlipidemia, history of hepatitis B, history of TIA, reflux disease, seasonal allergies, peripheral edema, type 2 diabetes, and morbid obesity with BMI of over 40, who was previously diagnosed with obstructive sleep apnea and placed on CPAP therapy. She needs re-evaluation (after over 15 years) and new equipment. Her Epworth sleepiness score is 15 out of 24. The patient's weight 258 pounds with a height of 64 (inches), resulting in a BMI of 43.5 kg/m2. The patient's neck circumference measured 18 inches.  CURRENT MEDICATIONS: Asipirn, Calcium carbonate, Coreg, Zyrtec, Vitamin D3, Temovate, Lotrisone, Lasix, Amaryl, Synthroid, Hyzaar, Magnesium, Omega-3, Prilosec, Proventil,.   PROCEDURE:  This is a multichannel digital polysomnogram utilizing the Somnostar 11.2 system.  Electrodes and sensors were applied and monitored per AASM Specifications.   EEG, EOG, Chin and Limb EMG, were sampled at 200 Hz.  ECG, Snore and Nasal Pressure, Thermal Airflow, Respiratory Effort, CPAP Flow and Pressure, Oximetry was sampled at 50 Hz. Digital video and audio were recorded.      BASELINE STUDY  Lights Out was at 22:01 and Lights On at 05:01.  Total recording time (TRT) was 420 minutes, with a total sleep time (TST) of 170.5 minutes.   The patient's sleep latency was 267 minutes, which is markedly delayed. She was restless and reported difficulty sleep without her CPAP, as she is not used to sleeping without it. REM sleep was absent. The sleep efficiency was 40.6%, which is markedly reduced.     SLEEP ARCHITECTURE: WASO (Wake after sleep onset) was 234.5 minutes with moderate to  severe sleep fragmentation noted.  There were 23 minutes in Stage N1, 114 minutes Stage N2, 33.5 minutes Stage N3 and 0 minutes in Stage REM.  The percentage of Stage N1 was 13.5%, which is increased, Stage N2 was 66.9%, which is increased, Stage N3 was 19.6%, and Stage R (REM sleep) was absent. The arousals were noted as: 30 were spontaneous, 11 were associated with PLMs, 34 were associated with respiratory events.   RESPIRATORY ANALYSIS:  There were a total of 86 respiratory events:  22 obstructive apneas, 0 central apneas and 0 mixed apneas with a total of 22 apneas and an apnea index (AI) of 7.7 /hour. There were 64 hypopneas with a hypopnea index of 22.5 /hour. The patient also had 0 respiratory event related arousals (RERAs).      The total APNEA/HYPOPNEA INDEX (AHI) was 30.3 /hour and the total RESPIRATORY DISTURBANCE INDEX was 30.3 /hour.  0 events occurred in REM sleep and 128 events in NREM. The REM AHI was n/a, versus a non-REM AHI of 30.3. The patient spent 65.5 minutes of total sleep time in the supine position and 105 minutes in non-supine. The supine AHI was 32.9 versus a non-supine AHI of 28.5.  OXYGEN SATURATION & C02:  The Wake baseline 02 saturation was 93%, with the lowest being 86%. Time spent below 89% saturation equaled 16 minutes.    PERIODIC LIMB MOVEMENTS: The patient had a total of 16 Periodic Limb Movements.  The Periodic Limb Movement (PLM) index was 5.6 and the PLM Arousal index was 3.9/hour.  Audio and video analysis did not  show any abnormal or unusual movements, behaviors, phonations or vocalizations. The patient took 1 bathroom break. Moderate snoring was noted. The EKG showed occasional PVCs, but was otherwise in keeping with normal sinus rhythm (NSR).  Post-study, the patient indicated that sleep was worse than usual.   IMPRESSION: 1. Obstructive Sleep Apnea (OSA) 2. Dysfunctions associated with sleep stages or arousals from sleep  3. Non-specific abnormal  EKG  RECOMMENDATIONS: 1. This study demonstrates severe obstructive sleep apnea, with a total AHI of 30.3/hour, and O2 nadir of 86%. The absence of REM sleep likely underestimated her AHI and O2 nadir. Treatment with positive airway pressure in the form of CPAP is recommended. This will require a full night titration study to optimize therapy. Other treatment options may include avoidance of supine sleep position along with weight loss, upper airway or jaw surgery in selected patients or the use of an oral appliance in certain patients. ENT evaluation and/or consultation with a maxillofacial surgeon or dentist may be feasible in some instances. Please note that untreated obstructive sleep apnea may carry additional perioperative morbidity. Patients with significant obstructive sleep apnea should receive perioperative PAP therapy and the surgeons and particularly the anesthesiologist should be informed of the diagnosis and the severity of the sleep disordered breathing. 2. This study shows significant sleep fragmentation and abnormal sleep stage percentages; these are nonspecific findings and per se do not signify an intrinsic sleep disorder or a cause for the patient's sleep-related symptoms. Causes include (but are not limited to) the first night effect of the sleep study, circadian rhythm disturbances, medication effect or an underlying mood disorder or medical problem. 3. The study showed occasional PVCs on single lead EKG; clinical correlation is recommended and consultation with cardiology may be feasible.   4. The patient should be cautioned not to drive, work at heights, or operate dangerous or heavy equipment when tired or sleepy. Review and reiteration of good sleep hygiene measures should be pursued with any patient. 5. The patient will be seen in follow-up in the sleep clinic at [facility] for discussion of the test results, symptom and treatment compliance review, further management strategies, etc. The  referring provider will be notified of the test results.  I certify that I have reviewed the entire raw data recording prior to the issuance of this report in accordance with the Standards of Accreditation of the American Academy of Sleep Medicine (AASM)  [name], MD, PhD Diplomat, American Board of Neurology and Sleep Medicine (Neurology and Sleep Medicine)

## 2018-01-24 ENCOUNTER — Ambulatory Visit (HOSPITAL_COMMUNITY)
Admission: RE | Admit: 2018-01-24 | Discharge: 2018-01-24 | Disposition: A | Discharge status: Home / Self Care | Payer: Medicare Other | Source: Ambulatory Visit | Attending: Cardiology | Admitting: Cardiology

## 2018-01-24 DIAGNOSIS — R0602 Shortness of breath: Secondary | ICD-10-CM | POA: Diagnosis not present

## 2018-01-24 DIAGNOSIS — R0609 Other forms of dyspnea: Secondary | ICD-10-CM | POA: Insufficient documentation

## 2018-01-24 NOTE — Progress Notes (Signed)
*  PRELIMINARY RESULTS* Echocardiogram 2D Echocardiogram has been performed.  Karen Potter 01/24/2018, 4:06 PM

## 2018-01-27 ENCOUNTER — Encounter: Payer: Self-pay | Admitting: Family Medicine

## 2018-01-27 ENCOUNTER — Telehealth: Payer: Self-pay | Admitting: Cardiology

## 2018-01-27 ENCOUNTER — Ambulatory Visit (INDEPENDENT_AMBULATORY_CARE_PROVIDER_SITE_OTHER): Payer: Medicare Other | Admitting: Family Medicine

## 2018-01-27 VITALS — BP 136/83 | HR 74 | Temp 98.0°F | Ht 64.0 in | Wt 258.0 lb

## 2018-01-27 DIAGNOSIS — H906 Mixed conductive and sensorineural hearing loss, bilateral: Secondary | ICD-10-CM | POA: Diagnosis not present

## 2018-01-27 DIAGNOSIS — H6593 Unspecified nonsuppurative otitis media, bilateral: Secondary | ICD-10-CM

## 2018-01-27 MED ORDER — CETIRIZINE HCL 10 MG PO TABS: 10.0000 mg | ORAL_TABLET | Freq: Every day | ORAL | 11 refills | Status: DC

## 2018-01-27 MED ORDER — FLUTICASONE PROPIONATE 50 MCG/ACT NA SUSP: 2.0000 | Freq: Every day | NASAL | 6 refills | Status: DC

## 2018-01-27 NOTE — Progress Notes (Signed)
Subjective:    Patient ID: Karen Potter, female    DOB: 1940-06-22, 77 y.o.   MRN: [id number]  Chief Complaint:  Ears feel muffled, has had some roaring in ears   HPI: Karen Potter is a 77 y.o. female presenting on 01/27/2018 for Ears feel muffled, has had some roaring in ears  Pt presents today with decreased hearing in her left ear and muffled hearing in her right ear. Pt states she has had decreased hearing in her left ear for 20 years. States she did see audiology in the past but did not follow up. Pt states the muffled hearing in her right ear started today. Pt reports she woke up and noticed it. States it sounds hollow at times. Pt denies fever, chills, rhinorrhea, sinus pressure, post nasal drainage, or cough. She reports she is unsure of when she last took her Zyrtec. She has not tried other remedies.   1. Otitis media with effusion, bilateral   2. Mixed conductive and sensorineural hearing loss of both ears      Relevant past medical, surgical, family and social history reviewed and updated as indicated. Interim medical history since our last visit reviewed. Allergies and medications reviewed and updated. DATA REVIEWED: CHART IN EPIC  Family History reviewed for pertinent findings.  Past Medical History:  Diagnosis Date  . Duodenal ulcer without hemorrhage or perforation    08-29-2015 per EGD reort  . Fatty liver   . Gastric ulcer without hemorrhage or perforation    08-29-2015 per EGD report  . Gastritis    per EGD 08-29-2015  . GERD (gastroesophageal reflux disease)   . Hearing loss    no hearing aids  . 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 History of kidney stones   . History of TIA (transient ischemic attack)    03-23-2014  w/ episode temporay amnesia  . Hyperlipidemia   .  Hypertension   . Hypothyroidism   . Iron deficiency anemia   . LAFB (left anterior fascicular block)   . OA (osteoarthritis)   . OSA on CPAP    severe per study 2003  . 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 RBBB (right bundle branch block)   . Seasonal allergies   . Type 2 diabetes mellitus ([place])   . Vitamin D deficiency     Past Surgical History:  Procedure Laterality Date  . CARPAL TUNNEL RELEASE Right 05-10-2007  . CHOLECYSTECTOMY OPEN  1985   and Appendectomy  . CYSTO/  RIGHT URETEROSCOPIC STONE EXTRACTON/  STENT PLACEMENT  06/ 2016   in [place]  . CYSTOSCOPY/RETROGRADE/URETEROSCOPY/STONE EXTRACTION WITH BASKET Right 11/17/2015   Procedure: CYSTOSCOPY/RETROGRADE PYELOGRAM RIGHT/DIAGNOSTIC RIGHT URETEROSCOPY/LASER RENAL CALCIFICATION/RIGHT STENT PLACEMENT;  Surgeon: Cleon Gustin, MD;  Location: [facility];  Service: Urology;  Laterality: Right;  . ESOPHAGOGASTRODUODENOSCOPY N/A 08/29/2015   Procedure: ESOPHAGOGASTRODUODENOSCOPY (EGD);  Surgeon: Danie Binder, MD;  Location: [facility];  Service: Endoscopy;  Laterality: N/A;  . HOLMIUM LASER APPLICATION Right 4/31/5400   Procedure: HOLMIUM LASER APPLICATION;  Surgeon: Cleon Gustin, MD;  Location: [facility];  Service: Urology;  Laterality: Right;  . KNEE ARTHROSCOPY Right 1999  . LEFT HEEL REPAIR SURGERY  1954;  1985;  1995   polio  . LUMBAR SPINE SURGERY  09-01-2010   L4 -L5 fusion,  L3 laminectomy,  L5 - S1 foraminotomy  . OVARIAN CYST SURGERY Right 1966  . PARATHYROIDECTOMY N/A 05/06/2015   Procedure: PARATHYROIDECTOMY;  Surgeon: Armandina Gemma, MD;  Location: [facility];  Service: General;  Laterality: N/A;   left superior  . TONSILLECTOMY  as child  . TOTAL ABDOMINAL HYSTERECTOMY W/ BILATERAL SALPINGOOPHORECTOMY  1984  . TOTAL KNEE ARTHROPLASTY Right 04-08-2008  . TRANSTHORACIC  ECHOCARDIOGRAM  04-07-2012   grade 1 diastolic function,  ef 46-27%/  mild AV calcification without stenosis/  trivial MR    Social History   Socioeconomic History  . Marital status: Married    Spouse name: Not on file  . Number of children: Not on file  . Years of education: Not on file  . Highest education level: Not on file  Occupational History  . Not on file  Social Needs  . Financial resource strain: Not on file  . Food insecurity:    Worry: Not on file    Inability: Not on file  . Transportation needs:    Medical: Not on file    Non-medical: Not on file  Tobacco Use  . Smoking status: Never Smoker  . Smokeless tobacco: Never Used  Substance and Sexual Activity  . Alcohol use: No  . Drug use: No  . Sexual activity: Not on file  Lifestyle  . Physical activity:    Days per week: Not on file    Minutes per session: Not on file  . Stress: Not on file  Relationships  . Social connections:    Talks on phone: Not on file    Gets together: Not on file    Attends religious service: Not on file    Active member of club or organization: Not on file    Attends meetings of clubs or organizations: Not on file    Relationship status: Not on file  . Intimate partner violence:    Fear of current or ex partner: Not on file    Emotionally abused: Not on file    Physically abused: Not on file    Forced sexual activity: Not on file  Other Topics Concern  . Not on file  Social History Narrative   Lives with husband.  Four children.      Outpatient Encounter Medications as of 01/27/2018  Medication Sig  . aspirin 325 MG tablet Take 325 mg by mouth every evening.   . calcium carbonate (CALCIUM 600) 600 MG TABS tablet Take 600 mg by mouth 2 (two) times daily with a meal.  . carvedilol (COREG) 6.25 MG tablet TAKE 1 TABLET BY MOUTH TWICE DAILY WITH MEALS  . Cholecalciferol (VITAMIN D3) 2000 UNITS capsule Take 2,000 Units by mouth 2 (two) times daily.   . clobetasol ointment  (TEMOVATE) 0.35 % Apply 1 application topically as needed.  . clotrimazole-betamethasone (LOTRISONE) cream Apply 1 application topically as needed.  . furosemide (LASIX) 20 MG tablet Take 20 mg by mouth every other day.  Marland Kitchen glimepiride (AMARYL) 2 MG tablet Take 2 mg by mouth daily with breakfast.  . hydrocortisone (ANUSOL-HC) 2.5 % rectal cream Place 1 application rectally 2 (two) times daily.  Marland Kitchen levothyroxine (SYNTHROID, LEVOTHROID) 150 MCG tablet TAKE 1 TABLET BY MOUTH ONCE DAILY  . losartan-hydrochlorothiazide (  HYZAAR) 100-25 MG tablet TAKE 1 TABLET BY MOUTH ONCE DAILY  . Magnesium 250 MG TABS Take 250 mg by mouth 2 (two) times daily.  . Omega-3 Fatty Acids (FISH OIL TRIPLE STRENGTH) 1400 MG CAPS Take 1,400 mg by mouth 4 (four) times daily.   Marland Kitchen omeprazole (PRILOSEC) 20 MG capsule Take 1 capsule (20 mg total) by mouth 2 (two) times daily before a meal.  . ONE TOUCH LANCETS MISC Patient to check BS once daily  . cetirizine (ZYRTEC) 10 MG tablet Take 1 tablet (10 mg total) by mouth daily.  . fluticasone (FLONASE) 50 MCG/ACT nasal spray Place 2 sprays into both nostrils daily.  . [DISCONTINUED] cetirizine (ZYRTEC) 10 MG tablet Take 1 tablet (10 mg total) by mouth daily. (Patient not taking: Reported on 01/18/2018)   No facility-administered encounter medications on file as of 01/27/2018.     Allergies  Allergen Reactions  . Codeine Itching  . Dilaudid [Hydromorphone] Other (See Comments)    Mouth blisters  . Other Itching    Most pain meds cause itching.  When has to take pain medication, she has been instructed to take Benadryl  . Doxycycline Rash    Review of Systems  Constitutional: Negative for chills, fatigue and fever.  HENT: Negative for congestion, ear discharge, ear pain, postnasal drip, rhinorrhea, sinus pressure, sinus pain and tinnitus.        Decreased hearing  Respiratory: Negative for cough and shortness of breath.   Neurological: Negative for dizziness, light-headedness  and headaches.  All other systems reviewed and are negative.       Objective:    BP 136/83   Pulse 74   Temp 98 F (36.7 C) (Oral)   Ht 5\' 4"  (1.626 m)   Wt 258 lb (117 kg)   BMI 44.29 kg/m    Wt Readings from Last 3 Encounters:  01/27/18 258 lb (117 kg)  01/18/18 257 lb (116.6 kg)  01/10/18 258 lb 8 oz (117.3 kg)    Physical Exam  Constitutional: She is oriented to person, place, and time. She appears well-developed and well-nourished. No distress.  HENT:  Head: Normocephalic and atraumatic.  Right Ear: External ear and ear canal normal. Tympanic membrane is not injected, not perforated, not erythematous, not retracted and not bulging. A middle ear effusion is present. Decreased hearing is noted.  Left Ear: External ear and ear canal normal. Tympanic membrane is not injected, not perforated, not erythematous, not retracted and not bulging. A middle ear effusion is present. Decreased hearing is noted.  Weber: lateralization to right ear Rinne: Left BC>AC, Right AC>BC  Eyes: Pupils are equal, round, and reactive to light. Conjunctivae are normal.  Neck: Normal range of motion. Neck supple. No JVD present. No tracheal deviation present. No thyromegaly present.  Cardiovascular: Normal rate, regular rhythm and normal heart sounds.  Pulmonary/Chest: Effort normal and breath sounds normal.  Lymphadenopathy:    She has no cervical adenopathy.  Neurological: She is alert and oriented to person, place, and time.  Skin: Skin is warm and dry. Capillary refill takes less than 2 seconds.  Psychiatric: She has a normal mood and affect. Her behavior is normal. Judgment and thought content normal.  Nursing note and vitals reviewed.       Assessment & Plan:   1. Otitis media with effusion, bilateral Flonase and Zyrtec as prescribed. Avoid second hand smoke.  - cetirizine (ZYRTEC) 10 MG tablet; Take 1 tablet (10 mg total) by mouth daily.  Dispense:  30 tablet; Refill: 11 - fluticasone  (FLONASE) 50 MCG/ACT nasal spray; Place 2 sprays into both nostrils daily.  Dispense: 16 g; Refill: 6  2. Mixed conductive and sensorineural hearing loss of both ears Follow up with audiology, referral made.  - Ambulatory referral to Audiology   Continue all other maintenance medications.  Follow up plan: Return in about 4 weeks (around 02/24/2018), or if symptoms worsen or fail to improve.  Educational handout given for hearing loss, otitis media with effusion   The above assessment and management plan was discussed with the patient. The patient verbalized understanding of and has agreed to the management plan. Patient is aware to call the clinic if symptoms persist or worsen. Patient is aware when to return to the clinic for a follow-up visit. Patient educated on when it is appropriate to go to the emergency department.   Monia Pouch, FNP-C [facility] [phone]

## 2018-01-27 NOTE — Patient Instructions (Signed)
Hearing Loss Hearing loss is a partial or total loss of the ability to hear. This can be temporary or permanent, and it can happen in one or both ears. Hearing loss may be referred to as deafness. Medical care is necessary to treat hearing loss properly and to prevent the condition from getting worse. Your hearing may partially or completely come back, depending on what caused your hearing loss and how severe it is. In some cases, hearing loss is permanent. What are the causes? Common causes of hearing loss include:  Too much wax in the ear canal.  Infection of the ear canal or middle ear.  Fluid in the middle ear.  Injury to the ear or surrounding area.  An object stuck in the ear.  Prolonged exposure to loud sounds, such as music.  Less common causes of hearing loss include:  Tumors in the ear.  Viral or bacterial infections, such as meningitis.  A hole in the eardrum (perforated eardrum).  Problems with the hearing nerve that sends signals between the brain and the ear.  Certain medicines.  What are the signs or symptoms? Symptoms of this condition may include:  Difficulty telling the difference between sounds.  Difficulty following a conversation when there is background noise.  Lack of response to sounds in your environment. This may be most noticeable when you do not respond to startling sounds.  Needing to turn up the volume on the television, radio, etc.  Ringing in the ears.  Dizziness.  Pain in the ears.  How is this diagnosed? This condition is diagnosed based on a physical exam and a hearing test (audiometry). The audiometry test will be performed by a hearing specialist (audiologist). You may also be referred to an ear, nose, and throat (ENT) specialist (otolaryngologist). How is this treated? Treatment for recent onset of hearing loss may include:  Ear wax removal.  Being prescribed medicines to prevent infection (antibiotics).  Being prescribed  medicines to reduce inflammation (corticosteroids).  Follow these instructions at home:  If you were prescribed an antibiotic medicine, take it as told by your health care provider. Do not stop taking the antibiotic even if you start to feel better.  Take over-the-counter and prescription medicines only as told by your health care provider.  Avoid loud noises.  Return to your normal activities as told by your health care provider. Ask your health care provider what activities are safe for you.  Keep all follow-up visits as told by your health care provider. This is important. Contact a health care provider if:  You feel dizzy.  You develop new symptoms.  You vomit or feel nauseous.  You have a fever. Get help right away if:  You develop sudden changes in your vision.  You have severe ear pain.  You have new or increased weakness.  You have a severe headache. This information is not intended to replace advice given to you by your health care provider. Make sure you discuss any questions you have with your health care provider. Document Released: 04/26/2005 Document Revised: 10/02/2015 Document Reviewed: 09/11/2014 Elsevier Interactive Patient Education  2018 [employer].   Otitis Media With Effusion Otitis media with effusion (OME) occurs when there is inflammation of the middle ear and fluid in the middle ear space. There are no signs and symptoms of infection. The middle ear space contains air and the bones for hearing. Air in the middle ear space helps to transmit sound to the brain. OME is a common  condition in children, and it often occurs after an ear infection. This condition may be present for several weeks or longer after an ear infection. Most cases of this condition get better on their own. What are the causes? OME is caused by a blockage of the eustachian tube in one or both ears. These tubes drain fluid in the ears to the back of the nose (nasopharynx). If the  tissue in the tube swells up (edema), the tube closes. This prevents fluid from draining. Blockage can be caused by:  Ear infections.  Colds and other upper respiratory infections.  Allergies.  Irritants, such as tobacco smoke.  Enlarged adenoids. The adenoids are areas of soft tissue located high in the back of the throat, behind the nose and the roof of the mouth. They are part of the body's natural defense (immune) system.  A mass in the nasopharynx.  Damage to the ear caused by pressure changes (barotrauma).  What increases the risk? Your child is more likely to develop this condition if:  He or she has repeated ear and sinus infections.  He or she has allergies.  He or she is exposed to tobacco smoke.  He or she attends daycare.  He or she is not breastfed.  What are the signs or symptoms? Symptoms of this condition may not be obvious. Sometimes this condition does not have any symptoms, or symptoms may overlap with those of a cold or upper respiratory tract illness. Symptoms of this condition include:  Temporary hearing loss.  A feeling of fullness in the ear without pain.  Irritability or agitation.  Balance (vestibular) problems.  As a result of hearing loss, your child may:  Listen to the TV at a loud volume.  Not respond to questions.  Ask "What?" often when spoken to.  Mistake or confuse one sound or word for another.  Perform poorly at school.  Have a poor attention span.  Become agitated or irritated easily.  How is this diagnosed? This condition is diagnosed with an ear exam. Your child's health care provider will look inside your child's ear with an instrument (otoscope) to check for redness, swelling, and fluid. Other tests may be done, including:  A test to check the movement of the eardrum (pneumatic otoscopy). This is done by squeezing a small amount of air into the ear.  A test that changes air pressure in the middle ear to check how  well the eardrum moves and to see if the eustachian tube is working (tympanogram).  Hearing test (audiogram). This test involves playing tones at different pitches to see if your child can hear each tone.  How is this treated? Treatment for this condition depends on the cause. In many cases, the fluid goes away on its own. In some cases, your child may need a procedure to create a hole in the eardrum to allow fluid to drain (myringotomy) and to insert small drainage tubes (tympanostomy tubes) into the eardrums. These tubes help to drain fluid and prevent infection. This procedure may be recommended if:  OME does not get better over several months.  Your child has many ear infections within several months.  Your child has noticeable hearing loss.  Your child has problems with speech and language development.  Surgery may also be done to remove the adenoids (adenoidectomy). Follow these instructions at home:  Give over-the-counter and prescription medicines only as told by your child's health care provider.  Keep children away from any tobacco smoke.  Keep all follow-up visits as told by your child's health care provider. This is important. How is this prevented?  Keep your child's vaccinations up to date. Make sure your child gets all recommended vaccinations, including a pneumonia and flu vaccine.  Encourage hand washing. Your child should wash his or her hands often with soap and water. If there is no soap and water, he or she should use hand sanitizer.  Avoid exposing your child to tobacco smoke.  Breastfeed your baby, if possible. Babies who are breastfed as long as possible are less likely to develop this condition. Contact a health care provider if:  Your child's hearing does not get better after 3 months.  Your child's hearing is worse.  Your child has ear pain.  Your child has a fever.  Your child has drainage from the ear.  Your child is dizzy.  Your child has a  lump on his or her neck. Get help right away if:  Your child has bleeding from the nose.  Your child cannot move part of her or his face.  Your child has trouble breathing.  Your child cannot smell.  Your child develops severe congestion.  Your child develops weakness.  Your child who is younger than 3 months has a temperature of 100F (38C) or higher. Summary  Otitis media with effusion (OME) occurs when there is inflammation of the middle ear and fluid in the middle ear space.  This condition is caused by blockage of one or both eustachian tubes, which drain fluid in the ears to the back of the nose.  Symptoms of this condition can include temporary hearing loss, a feeling of fullness in the ear, irritability or agitation, and balance (vestibular) problems. Sometimes, there are no symptoms.  This condition is diagnosed with an ear exam and tests, such as pneumatic otoscopy, tympanogram, and audiogram.  Treatment for this condition depends on the cause. In many cases, the fluid goes away on its own. This information is not intended to replace advice given to you by your health care provider. Make sure you discuss any questions you have with your health care provider. Document Released: 07/17/2003 Document Revised: 03/18/2016 Document Reviewed: 03/18/2016 Elsevier Interactive Patient Education  2017 [employer].

## 2018-01-27 NOTE — Telephone Encounter (Signed)
Follow Up:    Pt wants to know if her Echo results are ready from Tuesday?

## 2018-01-27 NOTE — Telephone Encounter (Signed)
Spoke to patient and made aware awaiting MD review.  Advised would call with results once reviewed.    Patient verbalized understanding.

## 2018-02-15 ENCOUNTER — Telehealth: Payer: Self-pay | Admitting: Cardiology

## 2018-02-15 NOTE — Telephone Encounter (Signed)
New Message   Patient is calling because she recently had a sleep study and she wants Dr. Percival Spanish to know that the information should be available on my chart.

## 2018-02-15 NOTE — Telephone Encounter (Signed)
Returned call and made patient aware results are in chart and would make Dr. Percival Spanish aware this was completed and he can review if he would like.    Patient aware.   Per chart review: study completed 9/9 with evidence of severe OSA

## 2018-02-16 NOTE — Telephone Encounter (Signed)
I reviewed and I see that they want her to follow up with CPAP treatment.  I agree

## 2018-02-17 NOTE — Telephone Encounter (Signed)
Spoke with pt letting her know about Dr Percival Spanish recommendation, pt voice understanding and thanks.Karen Potter

## 2018-02-20 ENCOUNTER — Ambulatory Visit (INDEPENDENT_AMBULATORY_CARE_PROVIDER_SITE_OTHER): Payer: Medicare Other | Admitting: Neurology

## 2018-02-20 ENCOUNTER — Other Ambulatory Visit: Payer: Self-pay | Admitting: Family Medicine

## 2018-02-20 DIAGNOSIS — R351 Nocturia: Secondary | ICD-10-CM

## 2018-02-20 DIAGNOSIS — R9431 Abnormal electrocardiogram [ECG] [EKG]: Secondary | ICD-10-CM

## 2018-02-20 DIAGNOSIS — G4733 Obstructive sleep apnea (adult) (pediatric): Secondary | ICD-10-CM | POA: Diagnosis not present

## 2018-02-20 DIAGNOSIS — G4720 Circadian rhythm sleep disorder, unspecified type: Secondary | ICD-10-CM

## 2018-02-20 DIAGNOSIS — Z6841 Body Mass Index (BMI) 40.0 and over, adult: Secondary | ICD-10-CM

## 2018-02-20 DIAGNOSIS — R635 Abnormal weight gain: Secondary | ICD-10-CM

## 2018-02-20 DIAGNOSIS — G4719 Other hypersomnia: Secondary | ICD-10-CM

## 2018-02-20 NOTE — Progress Notes (Deleted)
Cardiology Office Note   Date:  02/20/2018   ID:  Semiah, Konczal 1941-04-29, MRN [id number]  PCP:  Dettinger, Fransisca Kaufmann, MD  Cardiologist:   No primary care provider on file. Referring:  Dettinger, Fransisca Kaufmann, MD  No chief complaint on file.     History of Present Illness: Karen Potter is a 77 y.o. female who presents for evaluation of shortness of breath.  The patient did have an echo in May with a small pericardial effusion but normal EF.  This was done to evaluate cardiomegaly and cough.   She been having a cough since April and was treated with antibiotics and steroids and actually improved.  However, somewhere around May she started having increasing shortness of breath.   Follow up May because of cardiomegaly on CXR demonstrated no pericardial effusion.   BNP was normal.  ***   ***     She is always had some dyspnea and walks around the grocery store leaning on a cart.  However, she is now short of breath walking up the stairs in her home and can only make it halfway up at times without being breathless.  She is short of breath walking short distance on level ground.  She wears CPAP at night and does not describe PND or orthopnea.  She thinks she is had a little left leg swelling but she is not reporting any weight gain or abdominal swelling.  She does have baseline morbid obesity.  She denies any chest pressure, neck or arm discomfort.  She is not having any new palpitations, presyncope or syncope.  Of note she had the echocardiogram with a small pericardial effusion but no significant valvular abnormalities and a normal ejection fraction.  She had a follow-up chest x-ray a couple of months later that suggested increased cardiac silhouette size but there was no vascular congestion reported.   Past Medical History:  Diagnosis Date  . Duodenal ulcer without hemorrhage or perforation    08-29-2015 per EGD reort  . Fatty liver   . Gastric ulcer without hemorrhage or  perforation    08-29-2015 per EGD report  . Gastritis    per EGD 08-29-2015  . GERD (gastroesophageal reflux disease)   . Hearing loss    no hearing aids  . 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 History of kidney stones   . History of TIA (transient ischemic attack)    03-23-2014  w/ episode temporay amnesia  . Hyperlipidemia   . Hypertension   . Hypothyroidism   . Iron deficiency anemia   . LAFB (left anterior fascicular block)   . OA (osteoarthritis)   . OSA on CPAP    severe per study 2003  . 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 RBBB (right bundle branch block)   . Seasonal allergies   . Type 2 diabetes mellitus ([place])   . Vitamin D deficiency     Past Surgical History:  Procedure Laterality Date  . CARPAL TUNNEL RELEASE Right 05-10-2007  . CHOLECYSTECTOMY OPEN  1985   and Appendectomy  . CYSTO/  RIGHT URETEROSCOPIC  STONE EXTRACTON/  STENT PLACEMENT  06/ 2016   in [place]  . CYSTOSCOPY/RETROGRADE/URETEROSCOPY/STONE EXTRACTION WITH BASKET Right 11/17/2015   Procedure: CYSTOSCOPY/RETROGRADE PYELOGRAM RIGHT/DIAGNOSTIC RIGHT URETEROSCOPY/LASER RENAL CALCIFICATION/RIGHT STENT PLACEMENT;  Surgeon: Cleon Gustin, MD;  Location: [facility];  Service: Urology;  Laterality: Right;  . ESOPHAGOGASTRODUODENOSCOPY N/A 08/29/2015   Procedure: ESOPHAGOGASTRODUODENOSCOPY (EGD);  Surgeon: Danie Binder, MD;  Location: [facility];  Service: Endoscopy;  Laterality: N/A;  . HOLMIUM LASER APPLICATION Right 9/70/2637   Procedure: HOLMIUM LASER APPLICATION;  Surgeon: Cleon Gustin, MD;  Location: [facility];  Service: Urology;  Laterality: Right;  . KNEE ARTHROSCOPY Right 1999  . LEFT  HEEL REPAIR SURGERY  1954;  1985;  1995   polio  . LUMBAR SPINE SURGERY  09-01-2010   L4 -L5 fusion,  L3 laminectomy,  L5 - S1 foraminotomy  . OVARIAN CYST SURGERY Right 1966  . PARATHYROIDECTOMY N/A 05/06/2015   Procedure: PARATHYROIDECTOMY;  Surgeon: Armandina Gemma, MD;  Location: [facility];  Service: General;  Laterality: N/A;   left superior  . TONSILLECTOMY  as child  . TOTAL ABDOMINAL HYSTERECTOMY W/ BILATERAL SALPINGOOPHORECTOMY  1984  . TOTAL KNEE ARTHROPLASTY Right 04-08-2008  . TRANSTHORACIC ECHOCARDIOGRAM  04-07-2012   grade 1 diastolic function,  ef 85-88%/  mild AV calcification without stenosis/  trivial MR     Current Outpatient Medications  Medication Sig Dispense Refill  . aspirin 325 MG tablet Take 325 mg by mouth every evening.     . calcium carbonate (CALCIUM 600) 600 MG TABS tablet Take 600 mg by mouth 2 (two) times daily with a meal.    . carvedilol (COREG) 6.25 MG tablet TAKE 1 TABLET BY MOUTH TWICE DAILY WITH MEALS 180 tablet 1  . cetirizine (ZYRTEC) 10 MG tablet Take 1 tablet (10 mg total) by mouth daily. 30 tablet 11  . Cholecalciferol (VITAMIN D3) 2000 UNITS capsule Take 2,000 Units by mouth 2 (two) times daily.     . clobetasol ointment (TEMOVATE) 5.02 % Apply 1 application topically as needed.    . clotrimazole-betamethasone (LOTRISONE) cream Apply 1 application topically as needed.    . fluticasone (FLONASE) 50 MCG/ACT nasal spray Place 2 sprays into both nostrils daily. 16 g 6  . furosemide (LASIX) 20 MG tablet Take 20 mg by mouth every other day.    Marland Kitchen glimepiride (AMARYL) 2 MG tablet Take 2 mg by mouth daily with breakfast.    . glimepiride (AMARYL) 2 MG tablet TAKE ONE TABLET BY MOUTH ONCE DAILY BEFORE BREAKFAST 90 tablet 0  . hydrocortisone (ANUSOL-HC) 2.5 % rectal cream Place 1 application rectally 2 (two) times daily.    Marland Kitchen levothyroxine (SYNTHROID, LEVOTHROID) 150 MCG tablet TAKE 1 TABLET BY MOUTH ONCE DAILY 90 tablet 3  . losartan-hydrochlorothiazide  (HYZAAR) 100-25 MG tablet TAKE 1 TABLET BY MOUTH ONCE DAILY 90 tablet 1  . Magnesium 250 MG TABS Take 250 mg by mouth 2 (two) times daily.    . Omega-3 Fatty Acids (FISH OIL TRIPLE STRENGTH) 1400 MG CAPS Take 1,400 mg by mouth 4 (four) times daily.     Marland Kitchen omeprazole (PRILOSEC) 20 MG capsule Take 1 capsule (20 mg total) by mouth 2 (two) times daily before a meal. 180 capsule 3  . ONE TOUCH LANCETS MISC Patient to check BS once daily 200 each 0   No current facility-administered medications for this visit.     Allergies:   Codeine; Dilaudid [hydromorphone]; Other; and Doxycycline  ROS:  Please see the history of present illness.   Otherwise, review of systems are positive for ***.   All other systems are reviewed and negative.    PHYSICAL EXAM: VS:  There were no vitals taken for this visit. , BMI There is no height or weight on file to calculate BMI. GENERAL:  Well appearing NECK:  No jugular venous distention, waveform within normal limits, carotid upstroke brisk and symmetric, no bruits, no thyromegaly LUNGS:  Clear to auscultation bilaterally CHEST:  Unremarkable HEART:  PMI not displaced or sustained,S1 and S2 within normal limits, no S3, no S4, no clicks, no rubs, *** murmurs ABD:  Flat, positive bowel sounds normal in frequency in pitch, no bruits, no rebound, no guarding, no midline pulsatile mass, no hepatomegaly, no splenomegaly EXT:  2 plus pulses throughout, no edema, no cyanosis no clubbing    ***GENERAL:  Well appearing HEENT:  Pupils equal round and reactive, fundi not visualized, oral mucosa unremarkable NECK:  No jugular venous distention, waveform within normal limits, carotid upstroke brisk and symmetric, no bruits, no thyromegaly LYMPHATICS:  No cervical, inguinal adenopathy LUNGS:  Clear to auscultation bilaterally BACK:  No CVA tenderness CHEST:  Unremarkable HEART:  PMI not displaced or sustained,S1 and S2 within normal limits, no S3, no S4, no clicks, no  rubs, no murmurs ABD:  Flat, positive bowel sounds normal in frequency in pitch, no bruits, no rebound, no guarding, no midline pulsatile mass, no hepatomegaly, no splenomegaly EXT:  2 plus pulses throughout, no edema, no cyanosis no clubbing SKIN:  No rashes no nodules NEURO:  Cranial nerves II through XII grossly intact, motor grossly intact throughout PSYCH:  Cognitively intact, oriented to person place and time    EKG:  EKG is *** ordered today. ***   Recent Labs: 08/04/2017: ALT 34; Hemoglobin 13.5; Platelets 288 11/07/2017: TSH 2.250 11/28/2017: BUN 16; Creatinine, Ser 0.78; Potassium 4.1; Sodium 145 01/18/2018: NT-Pro BNP 68    Lipid Panel    Component Value Date/Time   CHOL 148 08/04/2017 1133   CHOL 129 10/03/2012 1310   TRIG 171 (H) 08/04/2017 1133   TRIG 181 (H) 04/10/2013 1206   TRIG 264 (H) 10/03/2012 1310   HDL 40 08/04/2017 1133   HDL 39 (L) 04/10/2013 1206   HDL 29 (L) 10/03/2012 1310   CHOLHDL 3.7 08/04/2017 1133   CHOLHDL 3.5 04/08/2012 0426   VLDL 33 04/08/2012 0426   LDLCALC 74 08/04/2017 1133   LDLCALC 37 04/10/2013 1206   LDLCALC 47 10/03/2012 1310      Wt Readings from Last 3 Encounters:  01/27/18 258 lb (117 kg)  01/18/18 257 lb (116.6 kg)  01/10/18 258 lb 8 oz (117.3 kg)      Other studies Reviewed: Additional studies/ records that were reviewed today include:   *** Review of the above records demonstrates:   ***   ASSESSMENT AND PLAN:  PERICARDIAL EFFUSION:  There was no evidence of this on follow up echo. ***  Given the dyspnea and the increase in cardiac silhouette she will need a repeat echocardiogram simply to quantify the amount of pericardial effusion.  She will also need a BNP level.  DOE:  ***  Walking her around the office today she was noticeably short of breath with upper airway wheezing.  However, her oxygen saturation stayed in the mid to high 90s.  Her heart rate responded to the pulse going up to 100.  Her breathlessness and  wheezing with proportion to objective  findings.  I will have a low threshold for perfusion imaging based on the results of the above.   Provided that she has no arrhythmic, pericardial effusion, ischemic or heart failure diagnosis then I would consider primary pulmonary work-up with the default diagnosis being weight and deconditioning which is a possibility.  ABNORMAL EKG:   ***  Based on the results of the above I will consider a Holter monitor to judge chronotropic competence although she seemed to demonstrate this in the office and I doubt that bradycardia arrhythmias contributing to her symptoms.  Current medicines are reviewed at length with the patient today.  The patient does not have concerns regarding medicines.  The following changes have been made:  *** Labs/ tests ordered today include:   ***  No orders of the defined types were placed in this encounter.    Disposition:   FU with me ***   Signed, Minus Breeding, MD  02/20/2018 9:32 PM    [facility]

## 2018-02-22 ENCOUNTER — Telehealth: Payer: Self-pay | Admitting: *Deleted

## 2018-02-22 ENCOUNTER — Other Ambulatory Visit: Payer: Self-pay

## 2018-02-22 ENCOUNTER — Ambulatory Visit: Payer: Medicare Other | Admitting: Cardiology

## 2018-02-22 ENCOUNTER — Inpatient Hospital Stay (HOSPITAL_COMMUNITY)
Admission: EM | Admit: 2018-02-22 | Discharge: 2018-02-27 | DRG: 872 | Disposition: A | Discharge status: Home / Self Care | Payer: Medicare Other | Attending: Internal Medicine | Admitting: Internal Medicine

## 2018-02-22 ENCOUNTER — Encounter (HOSPITAL_COMMUNITY): Payer: Self-pay

## 2018-02-22 ENCOUNTER — Emergency Department (HOSPITAL_COMMUNITY): Payer: Medicare Other

## 2018-02-22 DIAGNOSIS — E860 Dehydration: Secondary | ICD-10-CM | POA: Diagnosis not present

## 2018-02-22 DIAGNOSIS — K760 Fatty (change of) liver, not elsewhere classified: Secondary | ICD-10-CM | POA: Diagnosis present

## 2018-02-22 DIAGNOSIS — K219 Gastro-esophageal reflux disease without esophagitis: Secondary | ICD-10-CM | POA: Diagnosis present

## 2018-02-22 DIAGNOSIS — N179 Acute kidney failure, unspecified: Secondary | ICD-10-CM

## 2018-02-22 DIAGNOSIS — Z7982 Long term (current) use of aspirin: Secondary | ICD-10-CM | POA: Diagnosis not present

## 2018-02-22 DIAGNOSIS — Z9049 Acquired absence of other specified parts of digestive tract: Secondary | ICD-10-CM | POA: Diagnosis not present

## 2018-02-22 DIAGNOSIS — Z794 Long term (current) use of insulin: Secondary | ICD-10-CM | POA: Diagnosis not present

## 2018-02-22 DIAGNOSIS — E876 Hypokalemia: Secondary | ICD-10-CM | POA: Diagnosis not present

## 2018-02-22 DIAGNOSIS — E119 Type 2 diabetes mellitus without complications: Secondary | ICD-10-CM | POA: Diagnosis present

## 2018-02-22 DIAGNOSIS — Z8673 Personal history of transient ischemic attack (TIA), and cerebral infarction without residual deficits: Secondary | ICD-10-CM

## 2018-02-22 DIAGNOSIS — Z6841 Body Mass Index (BMI) 40.0 and over, adult: Secondary | ICD-10-CM

## 2018-02-22 DIAGNOSIS — R531 Weakness: Secondary | ICD-10-CM | POA: Diagnosis not present

## 2018-02-22 DIAGNOSIS — A020 Salmonella enteritis: Secondary | ICD-10-CM | POA: Diagnosis present

## 2018-02-22 DIAGNOSIS — A029 Salmonella infection, unspecified: Secondary | ICD-10-CM

## 2018-02-22 DIAGNOSIS — Z96651 Presence of right artificial knee joint: Secondary | ICD-10-CM | POA: Diagnosis present

## 2018-02-22 DIAGNOSIS — N898 Other specified noninflammatory disorders of vagina: Secondary | ICD-10-CM | POA: Diagnosis present

## 2018-02-22 DIAGNOSIS — R0989 Other specified symptoms and signs involving the circulatory and respiratory systems: Secondary | ICD-10-CM | POA: Diagnosis not present

## 2018-02-22 DIAGNOSIS — E785 Hyperlipidemia, unspecified: Secondary | ICD-10-CM | POA: Diagnosis present

## 2018-02-22 DIAGNOSIS — N390 Urinary tract infection, site not specified: Secondary | ICD-10-CM | POA: Diagnosis present

## 2018-02-22 DIAGNOSIS — Z7951 Long term (current) use of inhaled steroids: Secondary | ICD-10-CM

## 2018-02-22 DIAGNOSIS — Z7984 Long term (current) use of oral hypoglycemic drugs: Secondary | ICD-10-CM | POA: Diagnosis not present

## 2018-02-22 DIAGNOSIS — Z90710 Acquired absence of both cervix and uterus: Secondary | ICD-10-CM

## 2018-02-22 DIAGNOSIS — G4733 Obstructive sleep apnea (adult) (pediatric): Secondary | ICD-10-CM | POA: Diagnosis present

## 2018-02-22 DIAGNOSIS — I10 Essential (primary) hypertension: Secondary | ICD-10-CM | POA: Diagnosis not present

## 2018-02-22 DIAGNOSIS — H9190 Unspecified hearing loss, unspecified ear: Secondary | ICD-10-CM | POA: Diagnosis not present

## 2018-02-22 DIAGNOSIS — E1165 Type 2 diabetes mellitus with hyperglycemia: Secondary | ICD-10-CM | POA: Diagnosis not present

## 2018-02-22 DIAGNOSIS — Z79899 Other long term (current) drug therapy: Secondary | ICD-10-CM

## 2018-02-22 DIAGNOSIS — R338 Other retention of urine: Secondary | ICD-10-CM

## 2018-02-22 DIAGNOSIS — A419 Sepsis, unspecified organism: Secondary | ICD-10-CM | POA: Diagnosis not present

## 2018-02-22 DIAGNOSIS — E890 Postprocedural hypothyroidism: Secondary | ICD-10-CM | POA: Diagnosis not present

## 2018-02-22 DIAGNOSIS — R1110 Vomiting, unspecified: Secondary | ICD-10-CM | POA: Diagnosis not present

## 2018-02-22 DIAGNOSIS — R6520 Severe sepsis without septic shock: Secondary | ICD-10-CM | POA: Diagnosis not present

## 2018-02-22 DIAGNOSIS — Z79890 Hormone replacement therapy (postmenopausal): Secondary | ICD-10-CM

## 2018-02-22 DIAGNOSIS — E66813 Obesity, class 3: Secondary | ICD-10-CM

## 2018-02-22 LAB — CBG MONITORING, ED: Glucose-Capillary: 144 mg/dL — ABNORMAL HIGH (ref 70–99)

## 2018-02-22 MED ORDER — SODIUM CHLORIDE 0.9 % IV BOLUS (SEPSIS)
1000.0000 mL | Freq: Once | INTRAVENOUS | Status: AC
  Administered 2018-02-22: 1000 mL via INTRAVENOUS

## 2018-02-22 MED ORDER — ONDANSETRON 4 MG PO TBDP: 4.0000 mg | ORAL_TABLET | Freq: Three times a day (TID) | ORAL | 0 refills | Status: DC | PRN

## 2018-02-22 MED ORDER — ONDANSETRON HCL 4 MG/2ML IJ SOLN
4.0000 mg | Freq: Once | INTRAMUSCULAR | Status: AC
  Administered 2018-02-22: 4 mg via INTRAVENOUS
  Filled 2018-02-22: qty 2

## 2018-02-22 NOTE — Telephone Encounter (Signed)
Sent zofran,

## 2018-02-22 NOTE — Telephone Encounter (Signed)
She has severe diarrhea and has been throwing up since last night.  She has taken two doses of imodium with out relief.She is trying to drink to stay hydrated but keeps throwing up.  Can not try to come in because it is so bad.   Could you send in something to help with the nausea?   [facility], [place],  please

## 2018-02-22 NOTE — ED Triage Notes (Signed)
Pt from home tonight with vomiting and diarrhea since Monday.  Pt states she is weak all over, no one sided weakness or slurring of speech.  Pt denies pain.

## 2018-02-22 NOTE — Telephone Encounter (Signed)
Aware.  Script was sent in.

## 2018-02-22 NOTE — ED Provider Notes (Signed)
[facility] EMERGENCY DEPARTMENT Provider Note   CSN: [id number] Arrival date & time: 02/22/18  2247     History   Chief Complaint Chief Complaint  Patient presents with  . Emesis  . Diarrhea    HPI Karen Potter is a 77 y.o. female.  The history is provided by the patient and the spouse.  Emesis   This is a new problem. The current episode started 2 days ago. The problem has been rapidly worsening. The emesis has an appearance of stomach contents. There has been no fever. Associated symptoms include chills, diarrhea and headaches. Pertinent negatives include no abdominal pain and no fever.  Diarrhea   This is a new problem. The current episode started 2 days ago. The problem has been rapidly worsening. The stool consistency is described as watery. Associated symptoms include vomiting, chills and headaches. Pertinent negatives include no abdominal pain. She has tried anti-motility drugs for the symptoms. The treatment provided no relief.  Pt with History of hypertension/hyperlipidemia presents with vomiting/diarrhea. Episodes began about 2 days ago and then rapidly worsening.  There is no blood in stool or vomit.  She feels dehydrated and generalized weakness.  No syncope.  She reports headache, but denies any chest pain or abdominal pain Her and her husband just got back from [place].  No foreign travel.  No recent antibiotics, no recent admissions to hospital She has Tried Imodium without improvement.  Past Medical History:  Diagnosis Date  . Duodenal ulcer without hemorrhage or perforation    08-29-2015 per EGD reort  . Fatty liver   . Gastric ulcer without hemorrhage or perforation    08-29-2015 per EGD report  . Gastritis    per EGD 08-29-2015  . GERD (gastroesophageal reflux disease)   . Hearing loss    no hearing aids  . 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 History of kidney stones   . History of TIA (transient ischemic attack)    03-23-2014  w/ episode temporay amnesia  . Hyperlipidemia   . Hypertension   . Hypothyroidism   . Iron deficiency anemia   . LAFB (left anterior fascicular block)   . OA (osteoarthritis)   . OSA on CPAP    severe per study 2003  . 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 RBBB (right bundle branch block)   . Seasonal allergies   . Type 2 diabetes mellitus ([place])   . Vitamin D deficiency     Patient Active Problem List   Diagnosis Date Noted  . Mixed conductive and sensorineural hearing loss of both ears 01/27/2018  . 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 Morbid obesity ([place]) 10/28/2016  . Primary hyperparathyroidism ([place]) 05/06/2015  . Hyperparathyroidism, primary ([place]) 05/05/2015  . Type 2 diabetes mellitus with other specified complication ([place])   . GERD (gastroesophageal reflux disease) 11/05/2013  . Fatty liver 06/01/2013  . Hypercalcemia 05/14/2013  . Abnormal transaminases 05/14/2013  . Hot flashes 05/14/2013  . Hypertension associated with diabetes ([place])   . Hypothyroid   . Migraine   . Polio   . Amnesia   . Seasonal  allergies   . Vitamin D deficiency   . Aphasia 04/11/2012  . OBSTRUCTIVE SLEEP APNEA 05/31/2008  . Hyperlipidemia associated with type 2 diabetes mellitus ([place]) 05/31/2008  . OVARIAN CYST 05/31/2008  . CHOLECYSTECTOMY, HX OF 05/31/2008  . DYSPNEA 06/01/2007    Past Surgical History:  Procedure Laterality Date  . CARPAL TUNNEL RELEASE Right 05-10-2007  . CHOLECYSTECTOMY OPEN  1985   and Appendectomy  . CYSTO/  RIGHT URETEROSCOPIC STONE EXTRACTON/  STENT PLACEMENT  06/ 2016   in [place]  . CYSTOSCOPY/RETROGRADE/URETEROSCOPY/STONE EXTRACTION WITH BASKET Right 11/17/2015   Procedure: CYSTOSCOPY/RETROGRADE  PYELOGRAM RIGHT/DIAGNOSTIC RIGHT URETEROSCOPY/LASER RENAL CALCIFICATION/RIGHT STENT PLACEMENT;  Surgeon: Cleon Gustin, MD;  Location[facility];  Service: Urology;  Laterality: Right;  . ESOPHAGOGASTRODUODENOSCOPY N/A 08/29/2015   Procedure: ESOPHAGOGASTRODUODENOSCOPY (EGD);  Surgeon: Danie Binder, MD;  [facility];  Service: Endoscopy;  Laterality: N/A;  . HOLMIUM LASER APPLICATION Right 05/15/2692   Procedure: HOLMIUM LASER APPLICATION;  Surgeon: Cleon Gustin, MD;  Location: [facility];  Service: Urology;  Laterality: Right;  . KNEE ARTHROSCOPY Right 1999  . LEFT HEEL REPAIR SURGERY  1954;  1985;  1995   polio  . LUMBAR SPINE SURGERY  09-01-2010   L4 -L5 fusion,  L3 laminectomy,  L5 - S1 foraminotomy  . OVARIAN CYST SURGERY Right 1966  . PARATHYROIDECTOMY N/A 05/06/2015   Procedure: PARATHYROIDECTOMY;  Surgeon: Armandina Gemma, MD;  Location: [facility];  Service: General;  Laterality: N/A;   left superior  . TONSILLECTOMY  as child  . TOTAL ABDOMINAL HYSTERECTOMY W/ BILATERAL SALPINGOOPHORECTOMY  1984  . TOTAL KNEE ARTHROPLASTY Right 04-08-2008  . TRANSTHORACIC ECHOCARDIOGRAM  04-07-2012   grade 1 diastolic function,  ef 85-46%/  mild AV calcification without stenosis/  trivial MR     OB History   None      Home Medications    Prior to Admission medications   Medication Sig Start Date End Date Taking? Authorizing Provider  aspirin 325 MG tablet Take 325 mg by mouth every evening.     [provider]  calcium carbonate (CALCIUM 600) 600 MG TABS tablet Take 600 mg by mouth 2 (two) times daily with a meal.    [provider]  carvedilol (COREG) 6.25 MG tablet TAKE 1 TABLET BY MOUTH TWICE DAILY WITH MEALS 09/07/17   Dettinger, Fransisca Kaufmann, MD  cetirizine (ZYRTEC) 10 MG tablet Take 1 tablet (10 mg total) by mouth daily. 01/27/18   Baruch Gouty, FNP  Cholecalciferol (VITAMIN D3) 2000 UNITS capsule Take 2,000 Units by mouth  2 (two) times daily.     [provider]  clobetasol ointment (TEMOVATE) 2.70 % Apply 1 application topically as needed.    [provider]  clotrimazole-betamethasone (LOTRISONE) cream Apply 1 application topically as needed.    [provider]  fluticasone (FLONASE) 50 MCG/ACT nasal spray Place 2 sprays into both nostrils daily. 01/27/18   Baruch Gouty, FNP  furosemide (LASIX) 20 MG tablet Take 20 mg by mouth every other day.    [provider]  glimepiride (AMARYL) 2 MG tablet Take 2 mg by mouth daily with breakfast.    [provider]  glimepiride (AMARYL) 2 MG tablet TAKE ONE TABLET BY MOUTH ONCE DAILY BEFORE BREAKFAST 02/20/18   Dettinger, Fransisca Kaufmann, MD  hydrocortisone (ANUSOL-HC) 2.5 % rectal cream Place 1 application rectally 2 (two) times daily.    [provider]  levothyroxine (SYNTHROID, LEVOTHROID) 150  MCG tablet TAKE 1 TABLET BY MOUTH ONCE DAILY 05/17/17   Dettinger, Fransisca Kaufmann, MD  losartan-hydrochlorothiazide (HYZAAR) 100-25 MG tablet TAKE 1 TABLET BY MOUTH ONCE DAILY 12/26/17   Dettinger, Fransisca Kaufmann, MD  Magnesium 250 MG TABS Take 250 mg by mouth 2 (two) times daily.    [provider]  Omega-3 Fatty Acids (FISH OIL TRIPLE STRENGTH) 1400 MG CAPS Take 1,400 mg by mouth 4 (four) times daily.     [provider]  omeprazole (PRILOSEC) 20 MG capsule Take 1 capsule (20 mg total) by mouth 2 (two) times daily before a meal. 08/29/15   Fields, Sandi L, MD  ondansetron (ZOFRAN ODT) 4 MG disintegrating tablet Take 1 tablet (4 mg total) by mouth every 8 (eight) hours as needed for nausea or vomiting. 02/22/18   Dettinger, Fransisca Kaufmann, MD  [place] Patient to check BS once daily 12/31/14   Wardell Honour, MD    Family History Family History  Problem Relation Age of Onset  . Heart failure Mother        No details.  No MI  . Cancer Father        Lung  . Cancer Brother        Lung    Social History Social  History   Tobacco Use  . Smoking status: Never Smoker  . Smokeless tobacco: Never Used  Substance Use Topics  . Alcohol use: No  . Drug use: No     Allergies   Codeine; Dilaudid [hydromorphone]; Other; and Doxycycline   Review of Systems Review of Systems  Constitutional: Positive for chills and fatigue. Negative for fever.  Cardiovascular: Negative for chest pain.  Gastrointestinal: Positive for diarrhea and vomiting. Negative for abdominal pain.  Neurological: Positive for weakness and headaches.  All other systems reviewed and are negative.    Physical Exam Updated Vital Signs BP (!) 103/59   Pulse 86   Temp (!) 103 F (39.4 C) (Rectal)   Resp (!) 27   Ht 1.626 m (5\' 4" )   Wt 113.4 kg   SpO2 91%   BMI 42.91 kg/m   Physical Exam CONSTITUTIONAL: Elderly, uncomfortable appearing HEAD: Normocephalic/atraumatic EYES: EOMI/PERRL no icterus ENMT: Mucous membranes dry NECK: supple no meningeal signs SPINE/BACK:entire spine nontender CV: S1/S2 noted, no murmurs/rubs/gallops noted, tachycardic LUNGS: Lungs are clear to auscultation bilaterally, no apparent distress ABDOMEN: soft, nontender, no rebound or guarding, bowel sounds noted throughout abdomen GU:no cva tenderness NEURO: Pt is awake/alert/appropriate, moves all extremitiesx4.  No facial droop.  No arm/leg drift EXTREMITIES: pulses normal/equal, full ROM SKIN: warm, color normal PSYCH: no abnormalities of mood noted, alert and oriented to situation   ED Treatments / Results  Labs (all labs ordered are listed, but only abnormal results are displayed) Labs Reviewed  COMPREHENSIVE METABOLIC PANEL - Abnormal; Notable for the following components:      Result Value   Sodium 134 (*)    Potassium 3.3 (*)    CO2 20 (*)    Glucose, Bld 143 (*)    BUN 25 (*)    Creatinine, Ser 1.31 (*)    Calcium 8.7 (*)    GFR calc non Af Amer 38 (*)    GFR calc Af Amer 45 (*)    All other components within normal limits    CBC WITH DIFFERENTIAL/PLATELET - Abnormal; Notable for the following components:   WBC 22.6 (*)    Neutro Abs 18.9 (*)    Monocytes Absolute  1.8 (*)    Abs Immature Granulocytes 0.11 (*)    All other components within normal limits  CBG MONITORING, ED - Abnormal; Notable for the following components:   Glucose-Capillary 144 (*)    All other components within normal limits  CULTURE, BLOOD (ROUTINE X 2)  CULTURE, BLOOD (ROUTINE X 2)  URINE CULTURE  C DIFFICILE QUICK SCREEN W PCR REFLEX  URINALYSIS, ROUTINE W REFLEX MICROSCOPIC  I-STAT CG4 LACTIC ACID, ED  I-STAT CG4 LACTIC ACID, ED    EKG EKG Interpretation  Date/Time:  Thursday February 23 2018 01:40:20 EDT Ventricular Rate:  104 PR Interval:    QRS Duration: 152 QT Interval:  370 QTC Calculation: 487 R Axis:   -73 Text Interpretation:  Sinus tachycardia Multiple ventricular premature complexes RBBB and LAFB Probable left ventricular hypertrophy No significant change since last tracing Confirmed by [name] ([id number]) on 02/23/2018 2:01:04 AM   Radiology Dg Chest Port 1 View  Result Date: 02/23/2018 CLINICAL DATA:  Vomiting, diarrhea and weakness. EXAM: PORTABLE CHEST 1 VIEW COMPARISON:  11/07/2017 FINDINGS: Low lung volumes with mild crowding of interstitial lung markings and central vascular congestion. Heart is top-normal in size. Nonaneurysmal appearing thoracic aorta. Osteoarthritis of the included shoulders. IMPRESSION: Low lung volumes with crowding of interstitial markings and mild pulmonary vascular congestion. Electronically Signed   By: Ashley Royalty M.D.   On: 02/23/2018 00:09    Procedures Procedures   CRITICAL CARE Performed by: Sharyon Cable Total critical care time: 40 minutes Critical care time was exclusive of separately billable procedures and treating other patients. Critical care was necessary to treat or prevent imminent or life-threatening deterioration. Critical care was time spent  personally by me on the following activities: development of treatment plan with patient and/or surrogate as well as nursing, discussions with consultants, evaluation of patient's response to treatment, examination of patient, obtaining history from patient or surrogate, ordering and performing treatments and interventions, ordering and review of laboratory studies, ordering and review of radiographic studies, pulse oximetry and re-evaluation of patient's condition. Patient with fever/tachycardia sepsis requiring IV fluids and admission Medications Ordered in ED Medications  metroNIDAZOLE (FLAGYL) IVPB 500 mg (has no administration in time range)  vancomycin (VANCOCIN) IVPB 1000 mg/200 mL premix (1,000 mg Intravenous New Bag/Given 02/23/18 0113)  vancomycin (VANCOCIN) IVPB 1000 mg/200 mL premix (has no administration in time range)  sodium chloride 0.9 % bolus 1,000 mL (0 mLs Intravenous Stopped 02/23/18 0045)  ondansetron (ZOFRAN) injection 4 mg (4 mg Intravenous Given 02/22/18 2339)  sodium chloride 0.9 % bolus 1,000 mL (0 mLs Intravenous Stopped 02/23/18 0045)  lactated ringers bolus 1,000 mL (1,000 mLs Intravenous New Bag/Given 02/23/18 0049)    And  lactated ringers bolus 800 mL (800 mLs Intravenous New Bag/Given 02/23/18 0052)  ceFEPIme (MAXIPIME) 2 g in sodium chloride 0.9 % 100 mL IVPB (0 g Intravenous Stopped 02/23/18 0159)  acetaminophen (TYLENOL) tablet 650 mg (650 mg Oral Given 02/23/18 0043)     Initial Impression / Assessment and Plan / ED Course  I have reviewed the triage vital signs and the nursing notes.  Pertinent labs  results that were available during my care of the patient were reviewed by me and considered in my medical decision making (see chart for details).     11:24 PM Patient presents with significant episodes of vomiting/diarrhea.  She appears very dehydrated and tachycardic.  IV fluids have been ordered.  She has no focal abdominal tenderness at this  time. 12:13  AM Patient found to be febrile, tachycardic. Code sepsis called.  Chest x-ray reviewed. BMI greater than 35, she is already received 2 L fluid, Another 1000 of IV fluid has been ordered 12:36 AM Awake and alert this time.  Blood pressure is remained above 100.  MAPs are greater than 65. She denies any abdominal pain.  There is no focal abdominal tenderness. She would need to be admitted. 2:01 AM Lactate less than 2.  Patient has been stabilized in the emergency department.  Discussed with Dr. Darrick Meigs for admission Final Clinical Impressions(s) / ED Diagnoses   Final diagnoses:  Sepsis without acute organ dysfunction, due to unspecified organism [facility])  Dehydration  AKI (acute kidney injury) [facility])    ED Discharge Orders    None       [name], MD 02/23/18 0201

## 2018-02-23 ENCOUNTER — Inpatient Hospital Stay (HOSPITAL_COMMUNITY): Payer: Medicare Other

## 2018-02-23 ENCOUNTER — Ambulatory Visit: Payer: Medicare Other | Admitting: Family Medicine

## 2018-02-23 DIAGNOSIS — Z6841 Body Mass Index (BMI) 40.0 and over, adult: Secondary | ICD-10-CM | POA: Diagnosis not present

## 2018-02-23 DIAGNOSIS — N179 Acute kidney failure, unspecified: Secondary | ICD-10-CM

## 2018-02-23 DIAGNOSIS — E860 Dehydration: Secondary | ICD-10-CM | POA: Diagnosis not present

## 2018-02-23 DIAGNOSIS — E876 Hypokalemia: Secondary | ICD-10-CM | POA: Diagnosis present

## 2018-02-23 DIAGNOSIS — Z96651 Presence of right artificial knee joint: Secondary | ICD-10-CM | POA: Diagnosis present

## 2018-02-23 DIAGNOSIS — N898 Other specified noninflammatory disorders of vagina: Secondary | ICD-10-CM | POA: Diagnosis present

## 2018-02-23 DIAGNOSIS — A020 Salmonella enteritis: Secondary | ICD-10-CM | POA: Diagnosis present

## 2018-02-23 DIAGNOSIS — E119 Type 2 diabetes mellitus without complications: Secondary | ICD-10-CM | POA: Diagnosis present

## 2018-02-23 DIAGNOSIS — H9190 Unspecified hearing loss, unspecified ear: Secondary | ICD-10-CM | POA: Diagnosis present

## 2018-02-23 DIAGNOSIS — G4733 Obstructive sleep apnea (adult) (pediatric): Secondary | ICD-10-CM | POA: Diagnosis present

## 2018-02-23 DIAGNOSIS — A419 Sepsis, unspecified organism: Principal | ICD-10-CM

## 2018-02-23 DIAGNOSIS — N390 Urinary tract infection, site not specified: Secondary | ICD-10-CM | POA: Diagnosis present

## 2018-02-23 DIAGNOSIS — R338 Other retention of urine: Secondary | ICD-10-CM | POA: Diagnosis not present

## 2018-02-23 DIAGNOSIS — Z79899 Other long term (current) drug therapy: Secondary | ICD-10-CM | POA: Diagnosis not present

## 2018-02-23 DIAGNOSIS — R0989 Other specified symptoms and signs involving the circulatory and respiratory systems: Secondary | ICD-10-CM | POA: Diagnosis not present

## 2018-02-23 DIAGNOSIS — I10 Essential (primary) hypertension: Secondary | ICD-10-CM | POA: Diagnosis not present

## 2018-02-23 DIAGNOSIS — Z7982 Long term (current) use of aspirin: Secondary | ICD-10-CM | POA: Diagnosis not present

## 2018-02-23 DIAGNOSIS — K760 Fatty (change of) liver, not elsewhere classified: Secondary | ICD-10-CM | POA: Diagnosis present

## 2018-02-23 DIAGNOSIS — Z8673 Personal history of transient ischemic attack (TIA), and cerebral infarction without residual deficits: Secondary | ICD-10-CM | POA: Diagnosis not present

## 2018-02-23 DIAGNOSIS — E890 Postprocedural hypothyroidism: Secondary | ICD-10-CM | POA: Diagnosis present

## 2018-02-23 DIAGNOSIS — R197 Diarrhea, unspecified: Secondary | ICD-10-CM | POA: Diagnosis not present

## 2018-02-23 DIAGNOSIS — R1110 Vomiting, unspecified: Secondary | ICD-10-CM | POA: Diagnosis not present

## 2018-02-23 DIAGNOSIS — Z7951 Long term (current) use of inhaled steroids: Secondary | ICD-10-CM | POA: Diagnosis not present

## 2018-02-23 DIAGNOSIS — Z79890 Hormone replacement therapy (postmenopausal): Secondary | ICD-10-CM | POA: Diagnosis not present

## 2018-02-23 DIAGNOSIS — Z9049 Acquired absence of other specified parts of digestive tract: Secondary | ICD-10-CM | POA: Diagnosis not present

## 2018-02-23 DIAGNOSIS — Z7984 Long term (current) use of oral hypoglycemic drugs: Secondary | ICD-10-CM | POA: Diagnosis not present

## 2018-02-23 DIAGNOSIS — E785 Hyperlipidemia, unspecified: Secondary | ICD-10-CM | POA: Diagnosis present

## 2018-02-23 LAB — URINALYSIS, ROUTINE W REFLEX MICROSCOPIC
Bilirubin Urine: NEGATIVE
Glucose, UA: NEGATIVE mg/dL
Ketones, ur: NEGATIVE mg/dL
Nitrite: POSITIVE — AB
Protein, ur: 30 mg/dL — AB
Specific Gravity, Urine: 1.023 (ref 1.005–1.030)
pH: 5 (ref 5.0–8.0)

## 2018-02-23 LAB — COMPREHENSIVE METABOLIC PANEL
ALT: 25 U/L (ref 0–44)
ALT: 29 U/L (ref 0–44)
AST: 19 U/L (ref 15–41)
AST: 26 U/L (ref 15–41)
Albumin: 3 g/dL — ABNORMAL LOW (ref 3.5–5.0)
Albumin: 3.8 g/dL (ref 3.5–5.0)
Alkaline Phosphatase: 38 U/L (ref 38–126)
Alkaline Phosphatase: 48 U/L (ref 38–126)
Anion gap: 13 (ref 5–15)
Anion gap: 9 (ref 5–15)
BUN: 24 mg/dL — ABNORMAL HIGH (ref 8–23)
BUN: 25 mg/dL — ABNORMAL HIGH (ref 8–23)
CO2: 20 mmol/L — ABNORMAL LOW (ref 22–32)
CO2: 21 mmol/L — ABNORMAL LOW (ref 22–32)
Calcium: 8.1 mg/dL — ABNORMAL LOW (ref 8.9–10.3)
Calcium: 8.7 mg/dL — ABNORMAL LOW (ref 8.9–10.3)
Chloride: 101 mmol/L (ref 98–111)
Chloride: 105 mmol/L (ref 98–111)
Creatinine, Ser: 1.2 mg/dL — ABNORMAL HIGH (ref 0.44–1.00)
Creatinine, Ser: 1.31 mg/dL — ABNORMAL HIGH (ref 0.44–1.00)
GFR calc Af Amer: 45 mL/min — ABNORMAL LOW (ref >60)
GFR calc Af Amer: 50 mL/min — ABNORMAL LOW (ref >60)
GFR calc non Af Amer: 38 mL/min — ABNORMAL LOW (ref >60)
GFR calc non Af Amer: 43 mL/min — ABNORMAL LOW (ref >60)
Glucose, Bld: 143 mg/dL — ABNORMAL HIGH (ref 70–99)
Glucose, Bld: 166 mg/dL — ABNORMAL HIGH (ref 70–99)
Potassium: 2.8 mmol/L — ABNORMAL LOW (ref 3.5–5.1)
Potassium: 3.3 mmol/L — ABNORMAL LOW (ref 3.5–5.1)
Sodium: 134 mmol/L — ABNORMAL LOW (ref 135–145)
Sodium: 135 mmol/L (ref 135–145)
Total Bilirubin: 0.7 mg/dL (ref 0.3–1.2)
Total Bilirubin: 1 mg/dL (ref 0.3–1.2)
Total Protein: 6.6 g/dL (ref 6.5–8.1)
Total Protein: 8.1 g/dL (ref 6.5–8.1)

## 2018-02-23 LAB — C DIFFICILE QUICK SCREEN W PCR REFLEX
C Diff antigen: INVALID — AB
C Diff interpretation: INVALID
C Diff toxin: INVALID — AB

## 2018-02-23 LAB — CBC WITH DIFFERENTIAL/PLATELET
Abs Immature Granulocytes: 0.11 10*3/uL — ABNORMAL HIGH (ref 0.00–0.07)
Basophils Absolute: 0.1 10*3/uL (ref 0.0–0.1)
Basophils Relative: 0 %
Eosinophils Absolute: 0.2 10*3/uL (ref 0.0–0.5)
Eosinophils Relative: 1 %
HCT: 44.3 % (ref 36.0–46.0)
Hemoglobin: 14.1 g/dL (ref 12.0–15.0)
Immature Granulocytes: 1 %
Lymphocytes Relative: 7 %
Lymphs Abs: 1.6 10*3/uL (ref 0.7–4.0)
MCH: 29.7 pg (ref 26.0–34.0)
MCHC: 31.8 g/dL (ref 30.0–36.0)
MCV: 93.5 fL (ref 80.0–100.0)
Monocytes Absolute: 1.8 10*3/uL — ABNORMAL HIGH (ref 0.1–1.0)
Monocytes Relative: 8 %
Neutro Abs: 18.9 10*3/uL — ABNORMAL HIGH (ref 1.7–7.7)
Neutrophils Relative %: 83 %
Platelets: 246 10*3/uL (ref 150–400)
RBC: 4.74 MIL/uL (ref 3.87–5.11)
RDW: 13.9 % (ref 11.5–15.5)
WBC: 22.6 10*3/uL — ABNORMAL HIGH (ref 4.0–10.5)
nRBC: 0 % (ref 0.0–0.2)

## 2018-02-23 LAB — HEMOGLOBIN A1C
Hgb A1c MFr Bld: 6.5 % — ABNORMAL HIGH (ref 4.8–5.6)
Mean Plasma Glucose: 139.85 mg/dL

## 2018-02-23 LAB — CLOSTRIDIUM DIFFICILE BY PCR, REFLEXED: Toxigenic C. Difficile by PCR: NEGATIVE

## 2018-02-23 LAB — GLUCOSE, CAPILLARY: Glucose-Capillary: 98 mg/dL (ref 70–99)

## 2018-02-23 LAB — I-STAT CG4 LACTIC ACID, ED: Lactic Acid, Venous: 1.73 mmol/L (ref 0.5–1.9)

## 2018-02-23 LAB — CBG MONITORING, ED
Glucose-Capillary: 132 mg/dL — ABNORMAL HIGH (ref 70–99)
Glucose-Capillary: 130 mg/dL — ABNORMAL HIGH (ref 70–99)

## 2018-02-23 MED ORDER — METRONIDAZOLE IN NACL 5-0.79 MG/ML-% IV SOLN
500.0000 mg | Freq: Three times a day (TID) | INTRAVENOUS | Status: DC
  Administered 2018-02-23 – 2018-02-25 (×9): 500 mg via INTRAVENOUS
  Filled 2018-02-23 (×10): qty 100

## 2018-02-23 MED ORDER — LACTATED RINGERS IV BOLUS (SEPSIS)
1000.0000 mL | Freq: Once | INTRAVENOUS | Status: AC
  Administered 2018-02-23: 1000 mL via INTRAVENOUS

## 2018-02-23 MED ORDER — VANCOMYCIN HCL 10 G IV SOLR
1250.0000 mg | INTRAVENOUS | Status: DC
  Administered 2018-02-24: 1250 mg via INTRAVENOUS
  Filled 2018-02-23 (×4): qty 1250

## 2018-02-23 MED ORDER — POTASSIUM CHLORIDE CRYS ER 20 MEQ PO TBCR
40.0000 meq | EXTENDED_RELEASE_TABLET | ORAL | Status: AC
  Administered 2018-02-23 (×3): 40 meq via ORAL
  Filled 2018-02-23 (×3): qty 2

## 2018-02-23 MED ORDER — SODIUM CHLORIDE 0.9 % IV SOLN
2.0000 g | Freq: Once | INTRAVENOUS | Status: AC
  Administered 2018-02-23: 2 g via INTRAVENOUS
  Filled 2018-02-23: qty 2

## 2018-02-23 MED ORDER — SODIUM CHLORIDE 0.9 % IV SOLN
2.0000 g | Freq: Two times a day (BID) | INTRAVENOUS | Status: DC
  Administered 2018-02-23 – 2018-02-25 (×5): 2 g via INTRAVENOUS
  Filled 2018-02-23 (×9): qty 2

## 2018-02-23 MED ORDER — ACETAMINOPHEN 325 MG PO TABS
650.0000 mg | ORAL_TABLET | Freq: Once | ORAL | Status: AC
  Administered 2018-02-23: 650 mg via ORAL
  Filled 2018-02-23: qty 2

## 2018-02-23 MED ORDER — POTASSIUM CHLORIDE 10 MEQ/100ML IV SOLN
10.0000 meq | Freq: Once | INTRAVENOUS | Status: AC
  Administered 2018-02-23: 10 meq via INTRAVENOUS
  Filled 2018-02-23: qty 100

## 2018-02-23 MED ORDER — ACETAMINOPHEN 650 MG RE SUPP: 650.0000 mg | Freq: Four times a day (QID) | RECTAL | Status: DC | PRN

## 2018-02-23 MED ORDER — IOHEXOL 300 MG/ML  SOLN
75.0000 mL | Freq: Once | INTRAMUSCULAR | Status: AC | PRN
  Administered 2018-02-23: 75 mL via INTRAVENOUS

## 2018-02-23 MED ORDER — LEVOTHYROXINE SODIUM 75 MCG PO TABS
150.0000 ug | ORAL_TABLET | Freq: Every day | ORAL | Status: DC
  Administered 2018-02-23 – 2018-02-27 (×5): 150 ug via ORAL
  Filled 2018-02-23 (×4): qty 2
  Filled 2018-02-23: qty 3

## 2018-02-23 MED ORDER — ENOXAPARIN SODIUM 40 MG/0.4ML ~~LOC~~ SOLN
40.0000 mg | SUBCUTANEOUS | Status: DC
  Administered 2018-02-23 – 2018-02-27 (×5): 40 mg via SUBCUTANEOUS
  Filled 2018-02-23 (×5): qty 0.4

## 2018-02-23 MED ORDER — INSULIN ASPART 100 UNIT/ML ~~LOC~~ SOLN
0.0000 [IU] | Freq: Four times a day (QID) | SUBCUTANEOUS | Status: DC
  Administered 2018-02-23 – 2018-02-24 (×3): 1 [IU] via SUBCUTANEOUS
  Filled 2018-02-23: qty 1

## 2018-02-23 MED ORDER — PNEUMOCOCCAL VAC POLYVALENT 25 MCG/0.5ML IJ INJ: 0.5000 mL | INJECTION | INTRAMUSCULAR | Status: DC

## 2018-02-23 MED ORDER — POTASSIUM CHLORIDE 10 MEQ/100ML IV SOLN
10.0000 meq | INTRAVENOUS | Status: AC
  Administered 2018-02-23 (×3): 10 meq via INTRAVENOUS
  Filled 2018-02-23 (×3): qty 100

## 2018-02-23 MED ORDER — INFLUENZA VAC SPLIT HIGH-DOSE 0.5 ML IM SUSY
0.5000 mL | PREFILLED_SYRINGE | INTRAMUSCULAR | Status: DC
  Filled 2018-02-23: qty 0.5

## 2018-02-23 MED ORDER — SODIUM CHLORIDE 0.9 % IV SOLN
INTRAVENOUS | Status: DC
  Administered 2018-02-23 – 2018-02-27 (×7): via INTRAVENOUS

## 2018-02-23 MED ORDER — ACETAMINOPHEN 325 MG PO TABS
650.0000 mg | ORAL_TABLET | Freq: Four times a day (QID) | ORAL | Status: DC | PRN
  Administered 2018-02-23: 650 mg via ORAL
  Filled 2018-02-23: qty 2

## 2018-02-23 MED ORDER — LACTATED RINGERS IV BOLUS (SEPSIS)
800.0000 mL | Freq: Once | INTRAVENOUS | Status: AC
  Administered 2018-02-23: 800 mL via INTRAVENOUS

## 2018-02-23 MED ORDER — VANCOMYCIN HCL IN DEXTROSE 1-5 GM/200ML-% IV SOLN
1000.0000 mg | Freq: Once | INTRAVENOUS | Status: AC
  Administered 2018-02-23: 1000 mg via INTRAVENOUS
  Filled 2018-02-23: qty 200

## 2018-02-23 NOTE — ED Notes (Addendum)
In and out cath done at 00:08 with specimens collected. Verbal Dr. Darrick Meigs

## 2018-02-23 NOTE — ED Notes (Signed)
Patient has had a total of 1 liter of normal saline before the sepsis protocol.

## 2018-02-23 NOTE — ED Notes (Signed)
Bladder scan- 564 mL °

## 2018-02-23 NOTE — Progress Notes (Signed)
ANTIBIOTIC CONSULT NOTE-Preliminary  Pharmacy Consult for vancomycin and Cefepime Indication: Sepsis  Allergies  Allergen Reactions  . Codeine Itching  . Dilaudid [Hydromorphone] Other (See Comments)    Mouth blisters  . Other Itching    Most pain meds cause itching.  When has to take pain medication, she has been instructed to take Benadryl  . Doxycycline Rash    Patient Measurements: Height: 5\' 4"  (162.6 cm) Weight: 250 lb (113.4 kg) IBW/kg (Calculated) : 54.7  Vital Signs: Temp: 103 F (39.4 C) (10/16 2358) Temp Source: Rectal (10/16 2358) BP: 103/59 (10/16 2300) Pulse Rate: 86 (10/17 0000)  Labs: Recent Labs    02/22/18 2338  WBC 22.6*  HGB 14.1  PLT 246  CREATININE 1.31*    Estimated Creatinine Clearance: 45.1 mL/min (A) (by C-G formula based on SCr of 1.31 mg/dL (H)).  No results for input(s): VANCOTROUGH, VANCOPEAK, VANCORANDOM, GENTTROUGH, GENTPEAK, GENTRANDOM, TOBRATROUGH, TOBRAPEAK, TOBRARND, AMIKACINPEAK, AMIKACINTROU, AMIKACIN in the last 72 hours.   Microbiology: Recent Results (from the past 720 hour(s))  Blood Culture (routine x 2)     Status: None (Preliminary result)   Collection Time: 02/23/18 12:13 AM  Result Value Ref Range Status   Specimen Description BLOOD LEFT HAND  Final   Special Requests   Final    BOTTLES DRAWN AEROBIC AND ANAEROBIC Blood Culture adequate volume Performed at [facility], [address], [place], [place] [address]    Culture PENDING  Incomplete   Report Status PENDING  Incomplete  Blood Culture (routine x 2)     Status: None (Preliminary result)   Collection Time: 02/23/18 12:47 AM  Result Value Ref Range Status   Specimen Description BLOOD LEFT HAND  Final   Special Requests   Final    BOTTLES DRAWN AEROBIC ONLY Blood Culture results may not be optimal due to an inadequate volume of blood received in culture bottles Performed at [facility], [address], [place], [place] [address]    Culture PENDING   Incomplete   Report Status PENDING  Incomplete    Medical History: Past Medical History:  Diagnosis Date  . Duodenal ulcer without hemorrhage or perforation    08-29-2015 per EGD reort  . Fatty liver   . Gastric ulcer without hemorrhage or perforation    08-29-2015 per EGD report  . Gastritis    per EGD 08-29-2015  . GERD (gastroesophageal reflux disease)   . Hearing loss    no hearing aids  . 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   age 26--  pt states was quarantined   no treatment ;  per pt no symptoms or issues since  . History of kidney stones   . History of TIA (transient ischemic attack)    03-23-2014  w/ episode temporay amnesia  . Hyperlipidemia   . Hypertension   . Hypothyroidism   . Iron deficiency anemia   . LAFB (left anterior fascicular block)   . OA (osteoarthritis)   . OSA on CPAP    severe per study 2003  . Polio    age 87  -- residual left leg with repair surgery x3  . 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 RBBB (right bundle branch block)   . Seasonal allergies   . Type 2 diabetes mellitus ([place])   . Vitamin D deficiency  Medications:  Cefepime 2 Gm IV x 1 dose ordered by the EDP Metronidazole 500 mg IV ordered by the EDP Vancomycin 1000 mg IV x 1 dose ordered by the EDP  Assessment: 77 yo female with 2 day hx of vomiting/diarrhea seen in the ED with tachycardia, chills, fever and elevated WBCs. Pharmacy has been consulted for vancomycin dosing.  Goal of Therapy:  Vancomycin troughs 15-20 mcg/ml Eradicate infection  Plan:  Preliminary review of pertinent patient information completed.  Protocol will be initiated with one additional dose of 1000 mg IV vancomycin for a total loading dose of 2000 mg IV.  Forestine Na clinical pharmacist will complete review during morning rounds to assess patient and finalize treatment regimen if  needed.  Norberto Sorenson, [facility] 02/23/2018,1:47 AM

## 2018-02-23 NOTE — Progress Notes (Addendum)
Pharmacy Antibiotic Note  Karen Potter is a 77 y.o. female admitted on 02/22/2018 with infection-unknown source.  Pharmacy has been consulted for Vancomycin and Cefepime dosing.  Plan: Vancomycin 2000 mg IV x 1 dose. Vancomycin 1250 mg IV every 24 hours.  Goal trough 15-20 mcg/mL.  Cefepime 2000 mg IV every 12 hours. Monitor labs, c/s, and vanco trough as indicated.  Height: 5\' 4"  (162.6 cm) Weight: 250 lb (113.4 kg) IBW/kg (Calculated) : 54.7  Temp (24hrs), Avg:100.9 F (38.3 C), Min:98.7 F (37.1 C), Max:103 F (39.4 C)  Recent Labs  Lab 02/22/18 2338 02/23/18 0156 02/23/18 0456  WBC 22.6*  --   --   CREATININE 1.31*  --  1.20*  LATICACIDVEN  --  1.73  --     Estimated Creatinine Clearance: 49.2 mL/min (A) (by C-G formula based on SCr of 1.2 mg/dL (H)).    Allergies  Allergen Reactions  . Codeine Itching  . Dilaudid [Hydromorphone] Other (See Comments)    Mouth blisters  . Other Itching    Most pain meds cause itching.  When has to take pain medication, she has been instructed to take Benadryl  . Doxycycline Rash    Antimicrobials this admission: Vanco 10/17 >>  Cefepime 10/17 >>  Flagyl 10/17 >>  Microbiology results: 10/17 BCx: ngtd 10/17 UCx: pending   10/17 C Diff PCR: pending  Thank you for allowing pharmacy to be a part of this patient's care.   Margot Ables, PharmD Clinical Pharmacist 02/23/2018 7:52 AM

## 2018-02-23 NOTE — Progress Notes (Signed)
Patient seen and examined.  Admitted after midnight secondary to ongoing diarrhea, dehydration, acute kidney injury and hypokalemia.  Patient has met sepsis criteria in the setting of acute enterocolitis.  She has been colder extensively with broad-spectrum antibiotics initially and C. difficile by PCR has been ordered.  Patient continued to have acute diarrhea and her potassium is 2.8.  Fever has started to come down and her blood pressure stabilizing.  No nausea, no vomiting.  Vision diet will be advanced to full liquid, for now follow culture results and continue broad-spectrum antibiotics.  Continue IV fluid resuscitation and supportive care.  Patient experienced episode of urinary retention with 560 mL appreciated on bladder scan; in and out cath was provided. Please refer to H&P written by Dr. Darrick Meigs for further info/details on admission.  Barton Dubois MD [phone]

## 2018-02-23 NOTE — ED Notes (Signed)
Pharmacy to bring flagyl to ED for administration.

## 2018-02-23 NOTE — ED Notes (Signed)
Patient had total linen change and was cleaned up a total of 3 times during the shift with pads changed along with brief.

## 2018-02-23 NOTE — H&P (Addendum)
TRH H&P    Patient Demographics:    Karen Potter, is a 77 y.o. female  MRN: [id number]  DOB - Aug 01, 1940  Admit Date - 02/22/2018  Referring MD/NP/PA: Dr. Christy Gentles  Outpatient Primary MD for the patient is Dettinger, Fransisca Kaufmann, MD  Patient coming from: Home  Chief complaint-diarrhea, vomiting   HPI:    Karen Potter  is a 77 y.o. female, with history of hypothyroidism, hypertension, diabetes mellitus, hyperparathyroidism, morbid obesity, obstructive sleep apnea diarrhea and vomiting.  Patient says that she has had diarrhea for past 2 days which did not get better off even after she took Imodium.  This morning she started vomiting.  She denies abdominal pain.  No chest pain or shortness of breath.  No dysuria urgency frequency of urination. She has history of seizures but is not on antiseizure medication, last seizure was many years ago No previous history of stroke No history of MI In the ED patient had temperature of 103 F, was tachycardic and tachypneic.  Code sepsis was initiated and patient started on IV vancomycin, cefepime, Flagyl.    Review of systems:     All other systems reviewed and are negative.   With Past History of the following :    Past Medical History:  Diagnosis Date  . Duodenal ulcer without hemorrhage or perforation    08-29-2015 per EGD reort  . Fatty liver   . Gastric ulcer without hemorrhage or perforation    08-29-2015 per EGD report  . Gastritis    per EGD 08-29-2015  . GERD (gastroesophageal reflux disease)   . Hearing loss    no hearing aids  . 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 History of kidney stones   . History of TIA (transient ischemic attack)    03-23-2014  w/ episode temporay  amnesia  . Hyperlipidemia   . Hypertension   . Hypothyroidism   . Iron deficiency anemia   . LAFB (left anterior fascicular block)   . OA (osteoarthritis)   . OSA on CPAP    severe per study 2003  . 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 RBBB (right bundle branch block)   . Seasonal allergies   . Type 2 diabetes mellitus ([place])   . Vitamin D deficiency       Past Surgical History:  Procedure Laterality Date  . CARPAL TUNNEL RELEASE Right 05-10-2007  . CHOLECYSTECTOMY OPEN  1985   and Appendectomy  . CYSTO/  RIGHT URETEROSCOPIC STONE EXTRACTON/  STENT PLACEMENT  06/ 2016   in [place]  . CYSTOSCOPY/RETROGRADE/URETEROSCOPY/STONE EXTRACTION WITH BASKET Right 11/17/2015   Procedure: CYSTOSCOPY/RETROGRADE PYELOGRAM RIGHT/DIAGNOSTIC RIGHT URETEROSCOPY/LASER RENAL CALCIFICATION/RIGHT STENT PLACEMENT;  Surgeon: Cleon Gustin, MD;  Location[facility];  Service: Urology;  Laterality: Right;  . ESOPHAGOGASTRODUODENOSCOPY N/A 08/29/2015   Procedure: ESOPHAGOGASTRODUODENOSCOPY (EGD);  Surgeon: Danie Binder, MD;  Location[facility];  Service: Endoscopy;  Laterality: N/A;  . HOLMIUM LASER APPLICATION Right 0/35/5974   Procedure: HOLMIUM LASER APPLICATION;  Surgeon: Cleon Gustin, MD;  Location: [facility];  Service: Urology;  Laterality: Right;  . KNEE ARTHROSCOPY Right 1999  . LEFT HEEL REPAIR SURGERY  1954;  1985;  1995   polio  . LUMBAR SPINE SURGERY  09-01-2010   L4 -L5 fusion,  L3 laminectomy,  L5 - S1 foraminotomy  . OVARIAN CYST SURGERY Right 1966  . PARATHYROIDECTOMY N/A 05/06/2015   Procedure: PARATHYROIDECTOMY;  Surgeon: Armandina Gemma, MD;  Location: [facility];  Service: General;  Laterality: N/A;   left superior  . TONSILLECTOMY  as child  . TOTAL ABDOMINAL HYSTERECTOMY W/ BILATERAL SALPINGOOPHORECTOMY  1984  . TOTAL KNEE ARTHROPLASTY Right  04-08-2008  . TRANSTHORACIC ECHOCARDIOGRAM  04-07-2012   grade 1 diastolic function,  ef 16-38%/  mild AV calcification without stenosis/  trivial MR      Social History:      Social History   Tobacco Use  . Smoking status: Never Smoker  . Smokeless tobacco: Never Used  Substance Use Topics  . Alcohol use: No       Family History :     Family History  Problem Relation Age of Onset  . Heart failure Mother        No details.  No MI  . Cancer Father        Lung  . Cancer Brother        Lung      Home Medications:   Prior to Admission medications   Medication Sig Start Date End Date Taking? Authorizing Provider  aspirin 325 MG tablet Take 325 mg by mouth every evening.     [provider]  calcium carbonate (CALCIUM 600) 600 MG TABS tablet Take 600 mg by mouth 2 (two) times daily with a meal.    [provider]  carvedilol (COREG) 6.25 MG tablet TAKE 1 TABLET BY MOUTH TWICE DAILY WITH MEALS 09/07/17   Dettinger, Fransisca Kaufmann, MD  cetirizine (ZYRTEC) 10 MG tablet Take 1 tablet (10 mg total) by mouth daily. 01/27/18   Baruch Gouty, FNP  Cholecalciferol (VITAMIN D3) 2000 UNITS capsule Take 2,000 Units by mouth 2 (two) times daily.     [provider]  clobetasol ointment (TEMOVATE) 4.53 % Apply 1 application topically as needed.    [provider]  clotrimazole-betamethasone (LOTRISONE) cream Apply 1 application topically as needed.    [provider]  fluticasone (FLONASE) 50 MCG/ACT nasal spray Place 2 sprays into both nostrils daily. 01/27/18   Baruch Gouty, FNP  furosemide (LASIX) 20 MG tablet Take 20 mg by mouth every other day.    [provider]  glimepiride (AMARYL) 2 MG tablet Take 2 mg by mouth daily with breakfast.    [provider]  glimepiride (AMARYL) 2 MG tablet TAKE ONE TABLET BY MOUTH ONCE DAILY BEFORE BREAKFAST 02/20/18   Dettinger, Fransisca Kaufmann, MD  hydrocortisone (ANUSOL-HC) 2.5 % rectal cream Place  1 application rectally 2 (two) times daily.    [provider]  levothyroxine (SYNTHROID, LEVOTHROID) 150 MCG tablet TAKE 1 TABLET BY MOUTH ONCE DAILY 05/17/17   Dettinger, Fransisca Kaufmann, MD  losartan-hydrochlorothiazide [facility])  100-25 MG tablet TAKE 1 TABLET BY MOUTH ONCE DAILY 12/26/17   Dettinger, Fransisca Kaufmann, MD  Magnesium 250 MG TABS Take 250 mg by mouth 2 (two) times daily.    [provider]  Omega-3 Fatty Acids (FISH OIL TRIPLE STRENGTH) 1400 MG CAPS Take 1,400 mg by mouth 4 (four) times daily.     [provider]  omeprazole (PRILOSEC) 20 MG capsule Take 1 capsule (20 mg total) by mouth 2 (two) times daily before a meal. 08/29/15   Fields, Sandi L, MD  ondansetron (ZOFRAN ODT) 4 MG disintegrating tablet Take 1 tablet (4 mg total) by mouth every 8 (eight) hours as needed for nausea or vomiting. 02/22/18   Dettinger, Fransisca Kaufmann, MD  [place] Patient to check BS once daily 12/31/14   Wardell Honour, MD     Allergies:     Allergies  Allergen Reactions  . Codeine Itching  . Dilaudid [Hydromorphone] Other (See Comments)    Mouth blisters  . Other Itching    Most pain meds cause itching.  When has to take pain medication, she has been instructed to take Benadryl  . Doxycycline Rash     Physical Exam:   Vitals  Blood pressure (!) 103/59, pulse 86, temperature (!) 103 F (39.4 C), temperature source Rectal, resp. rate (!) 27, height '5\' 4"'  (1.626 m), weight 113.4 kg, SpO2 91 %.  1.  General: Appears in mild distress  2. Psychiatric:  Intact judgement and  insight, awake alert, oriented x 3.  3. Neurologic: No focal neurological deficits, all cranial nerves intact.Strength 5/5 all 4 extremities, sensation intact all 4 extremities, plantars down going.  4. Eyes :  anicteric sclerae, moist conjunctivae with no lid lag. PERRLA.  5. ENMT:  Oropharynx clear with moist mucous membranes and good dentition  6. Neck:  supple, no cervical  lymphadenopathy appriciated, No thyromegaly  7. Respiratory : Normal respiratory effort, good air movement bilaterally,clear to  auscultation bilaterally  8. Cardiovascular : RRR, no gallops, rubs or murmurs, no leg edema  9. Gastrointestinal:  Positive bowel sounds, abdomen soft, non-tender to palpation,no hepatosplenomegaly, no rigidity or guarding       10. Skin:  No cyanosis, normal texture and turgor, no rash, lesions or ulcers  11.Musculoskeletal:  Good muscle tone,  joints appear normal , no effusions,  normal range of motion    Data Review:    CBC Recent Labs  Lab 02/22/18 2338  WBC 22.6*  HGB 14.1  HCT 44.3  PLT 246  MCV 93.5  MCH 29.7  MCHC 31.8  RDW 13.9  LYMPHSABS 1.6  MONOABS 1.8*  EOSABS 0.2  BASOSABS 0.1   ------------------------------------------------------------------------------------------------------------------  Results for orders placed or performed during the hospital encounter of 02/22/18 (from the past 48 hour(s))  CBG monitoring, ED     Status: Abnormal   Collection Time: 02/22/18 11:08 PM  Result Value Ref Range   Glucose-Capillary 144 (H) 70 - 99 mg/dL  Comprehensive metabolic panel     Status: Abnormal   Collection Time: 02/22/18 11:38 PM  Result Value Ref Range   Sodium 134 (L) 135 - 145 mmol/L   Potassium 3.3 (L) 3.5 - 5.1 mmol/L   Chloride 101 98 - 111 mmol/L   CO2 20 (L) 22 - 32 mmol/L   Glucose, Bld 143 (H) 70 - 99 mg/dL   BUN 25 (H) 8 - 23 mg/dL   Creatinine, Ser 1.31 (H) 0.44 - 1.00 mg/dL  Calcium 8.7 (L) 8.9 - 10.3 mg/dL   Total Protein 8.1 6.5 - 8.1 g/dL   Albumin 3.8 3.5 - 5.0 g/dL   AST 26 15 - 41 U/L   ALT 29 0 - 44 U/L   Alkaline Phosphatase 48 38 - 126 U/L   Total Bilirubin 1.0 0.3 - 1.2 mg/dL   GFR calc non Af Amer 38 (L) >60 mL/min   GFR calc Af Amer 45 (L) >60 mL/min    Comment: (NOTE) The eGFR has been calculated using the CKD EPI equation. This calculation has not been validated in all clinical  situations. eGFR's persistently <60 mL/min signify possible Chronic Kidney Disease.    Anion gap 13 5 - 15    Comment: Performed at [facility], [address], [place], [place] [address]  CBC with Differential/Platelet     Status: Abnormal   Collection Time: 02/22/18 11:38 PM  Result Value Ref Range   WBC 22.6 (H) 4.0 - 10.5 K/uL   RBC 4.74 3.87 - 5.11 MIL/uL   Hemoglobin 14.1 12.0 - 15.0 g/dL   HCT 44.3 36.0 - 46.0 %   MCV 93.5 80.0 - 100.0 fL   MCH 29.7 26.0 - 34.0 pg   MCHC 31.8 30.0 - 36.0 g/dL   RDW 13.9 11.5 - 15.5 %   Platelets 246 150 - 400 K/uL   nRBC 0.0 0.0 - 0.2 %   Neutrophils Relative % 83 %   Neutro Abs 18.9 (H) 1.7 - 7.7 K/uL   Lymphocytes Relative 7 %   Lymphs Abs 1.6 0.7 - 4.0 K/uL   Monocytes Relative 8 %   Monocytes Absolute 1.8 (H) 0.1 - 1.0 K/uL   Eosinophils Relative 1 %   Eosinophils Absolute 0.2 0.0 - 0.5 K/uL   Basophils Relative 0 %   Basophils Absolute 0.1 0.0 - 0.1 K/uL   WBC Morphology MILD LEFT SHIFT (1-5% METAS, OCC MYELO, OCC BANDS)    Immature Granulocytes 1 %   Abs Immature Granulocytes 0.11 (H) 0.00 - 0.07 K/uL    Comment: Performed at [facility], [address] [place], [place] [address]  Blood Culture (routine x 2)     Status: None (Preliminary result)   Collection Time: 02/23/18 12:13 AM  Result Value Ref Range   Specimen Description BLOOD LEFT HAND    Special Requests      BOTTLES DRAWN AEROBIC AND ANAEROBIC Blood Culture adequate volume Performed at [facility], [address], [place], [place] [address]    Culture PENDING    Report Status PENDING   Blood Culture (routine x 2)     Status: None (Preliminary result)   Collection Time: 02/23/18 12:47 AM  Result Value Ref Range   Specimen Description BLOOD LEFT HAND    Special Requests      BOTTLES DRAWN AEROBIC ONLY Blood Culture results may not be optimal due to an inadequate volume of blood received in culture bottles Performed at [facility], [address],  [place], [place] [address]    Culture PENDING    Report Status PENDING   I-Stat CG4 Lactic Acid, ED  (not at  [facility])     Status: None   Collection Time: 02/23/18  1:56 AM  Result Value Ref Range   Lactic Acid, Venous 1.73 0.5 - 1.9 mmol/L    Chemistries  Recent Labs  Lab 02/22/18 2338  NA 134*  K 3.3*  CL 101  CO2 20*  GLUCOSE 143*  BUN 25*  CREATININE  1.31*  CALCIUM 8.7*  AST 26  ALT 29  ALKPHOS 48  BILITOT 1.0   ------------------------------------------------------------------------------------------------------------------  ------------------------------------------------------------------------------------------------------------------ GFR: Estimated Creatinine Clearance: 45.1 mL/min (A) (by C-G formula based on SCr of 1.31 mg/dL (H)). Liver Function Tests: Recent Labs  Lab 02/22/18 2338  AST 26  ALT 29  ALKPHOS 48  BILITOT 1.0  PROT 8.1  ALBUMIN 3.8   No results for input(s): LIPASE, AMYLASE in the last 168 hours. No results for input(s): AMMONIA in the last 168 hours. Coagulation Profile: No results for input(s): INR, PROTIME in the last 168 hours. Cardiac Enzymes: No results for input(s): CKTOTAL, CKMB, CKMBINDEX, TROPONINI in the last 168 hours. BNP (last 3 results) Recent Labs    01/18/18 1146  PROBNP 68   HbA1C: No results for input(s): HGBA1C in the last 72 hours. CBG: Recent Labs  Lab 02/22/18 2308  GLUCAP 144*   Lipid Profile: No results for input(s): CHOL, HDL, LDLCALC, TRIG, CHOLHDL, LDLDIRECT in the last 72 hours. Thyroid Function Tests: No results for input(s): TSH, T4TOTAL, FREET4, T3FREE, THYROIDAB in the last 72 hours. Anemia Panel: No results for input(s): VITAMINB12, FOLATE, FERRITIN, TIBC, IRON, RETICCTPCT in the last 72 hours.  --------------------------------------------------------------------------------------------------------------- Urine analysis:    Component Value Date/Time   COLORURINE YELLOW 04/08/2012 0045    APPEARANCEUR Clear 07/05/2017 1552   LABSPEC 1.019 04/08/2012 0045   PHURINE 5.5 04/08/2012 0045   GLUCOSEU Negative 07/05/2017 1552   HGBUR NEGATIVE 04/08/2012 0045   BILIRUBINUR Negative 07/05/2017 1552   KETONESUR NEGATIVE 04/08/2012 0045   PROTEINUR Negative 07/05/2017 1552   PROTEINUR NEGATIVE 04/08/2012 0045   UROBILINOGEN negative 06/01/2013 1142   UROBILINOGEN 0.2 04/08/2012 0045   NITRITE Negative 07/05/2017 1552   NITRITE NEGATIVE 04/08/2012 0045   LEUKOCYTESUR 2+ (A) 07/05/2017 1552      Imaging Results:    Dg Chest Port 1 View  Result Date: 02/23/2018 CLINICAL DATA:  Vomiting, diarrhea and weakness. EXAM: PORTABLE CHEST 1 VIEW COMPARISON:  11/07/2017 FINDINGS: Low lung volumes with mild crowding of interstitial lung markings and central vascular congestion. Heart is top-normal in size. Nonaneurysmal appearing thoracic aorta. Osteoarthritis of the included shoulders. IMPRESSION: Low lung volumes with crowding of interstitial markings and mild pulmonary vascular congestion. Electronically Signed   By: Ashley Royalty M.D.   On: 02/23/2018 00:09    My personal review of EKG: Rhythm NSR   Assessment & Plan:    Active Problems:   Sepsis ([place])   1. Sepsis-patient presented with sepsis physiology with temperature 103 F, sinus tachycardia, tachypnea with ongoing vomiting and diarrhea.  WBC 22,000 with left shift on differential.  Blood cultures x2 and urine culture have been obtained.  Lactic acid is 1.73.  Will obtain C. difficile PCR.  Also obtain CT of abdomen and pelvis.  We will continue with empiric antibiotics vancomycin, cefepime, Flagyl.  2. Diarrhea-unclear etiology, patient denies taking any antibiotics recently.  Will check stool for C. difficile PCR.  Continue aggressive IV hydration with normal saline.  CT abdomen/pelvis has been ordered as above.  Continue Zofran as needed for vomiting.  3. Diabetes mellitus-hold oral hypoglycemic agents, will start sliding scale  insulin with NovoLog.  Check CBG every 6 hours.  4. Hypothyroidism-continue Synthroid  5. Hypertension-hold antihypertensive medications, including Coreg, Hyzaar, Lasix 6. Acute kidney injury-mild, patient's creatinine is 1.31, patient received IV fluid bolus in the ED.  Continue IV normal saline at 100 mL/h.  Follow renal function in a.m.   DVT Prophylaxis-  Lovenox   AM Labs Ordered, also please review Full Orders  Family Communication: Admission, patients condition and plan of care including tests being ordered have been discussed with the patient and her daughter at bedside who indicate understanding and agree with the plan and Code Status.  Code Status:  Full code  Admission status: Inpatient: Based on patients clinical presentation and evaluation of above clinical data, I have made determination that patient meets Inpatient criteria at this time.  Patient presenting with sepsis physiology, started on broad-spectrum IV antibiotics.  Continues diarrhea, requiring IV fluid hydration with normal saline.  Time spent in minutes : 60 minutes   Oswald Hillock M.D on 02/23/2018 at 2:14 AM  Between 7am to 7pm - Pager - [phone]. After 7pm go to [email or web address] - password [facility]  [facility] - Office  [phone]

## 2018-02-24 DIAGNOSIS — E1165 Type 2 diabetes mellitus with hyperglycemia: Secondary | ICD-10-CM

## 2018-02-24 DIAGNOSIS — Z794 Long term (current) use of insulin: Secondary | ICD-10-CM

## 2018-02-24 DIAGNOSIS — R338 Other retention of urine: Secondary | ICD-10-CM

## 2018-02-24 LAB — GLUCOSE, CAPILLARY
Glucose-Capillary: 100 mg/dL — ABNORMAL HIGH (ref 70–99)
Glucose-Capillary: 108 mg/dL — ABNORMAL HIGH (ref 70–99)
Glucose-Capillary: 129 mg/dL — ABNORMAL HIGH (ref 70–99)
Glucose-Capillary: 92 mg/dL (ref 70–99)

## 2018-02-24 LAB — BASIC METABOLIC PANEL
Anion gap: 8 (ref 5–15)
BUN: 18 mg/dL (ref 8–23)
CO2: 20 mmol/L — ABNORMAL LOW (ref 22–32)
Calcium: 7.9 mg/dL — ABNORMAL LOW (ref 8.9–10.3)
Chloride: 109 mmol/L (ref 98–111)
Creatinine, Ser: 0.89 mg/dL (ref 0.44–1.00)
GFR calc Af Amer: >60 mL/min (ref >60)
GFR calc non Af Amer: >60 mL/min (ref >60)
Glucose, Bld: 128 mg/dL — ABNORMAL HIGH (ref 70–99)
Potassium: 3.3 mmol/L — ABNORMAL LOW (ref 3.5–5.1)
Sodium: 137 mmol/L (ref 135–145)

## 2018-02-24 LAB — MRSA PCR SCREENING: MRSA by PCR: NEGATIVE

## 2018-02-24 LAB — CREATININE, SERUM
Creatinine, Ser: 0.94 mg/dL (ref 0.44–1.00)
GFR calc Af Amer: >60 mL/min (ref >60)
GFR calc non Af Amer: 57 mL/min — ABNORMAL LOW (ref >60)

## 2018-02-24 MED ORDER — CLOBETASOL PROPIONATE 0.05 % EX OINT
TOPICAL_OINTMENT | Freq: Two times a day (BID) | CUTANEOUS | Status: DC
  Administered 2018-02-24 – 2018-02-27 (×7): via TOPICAL
  Filled 2018-02-24: qty 15

## 2018-02-24 MED ORDER — CLOBETASOL PROPIONATE 0.05 % EX CREA
TOPICAL_CREAM | Freq: Two times a day (BID) | CUTANEOUS | Status: DC
  Filled 2018-02-24: qty 15

## 2018-02-24 MED ORDER — FAMOTIDINE 20 MG PO TABS
20.0000 mg | ORAL_TABLET | Freq: Every day | ORAL | Status: DC
  Administered 2018-02-24: 20 mg via ORAL
  Filled 2018-02-24: qty 1

## 2018-02-24 MED ORDER — POTASSIUM CHLORIDE CRYS ER 20 MEQ PO TBCR
40.0000 meq | EXTENDED_RELEASE_TABLET | ORAL | Status: AC
  Administered 2018-02-24 – 2018-02-25 (×3): 40 meq via ORAL
  Filled 2018-02-24 (×3): qty 2

## 2018-02-24 NOTE — Progress Notes (Signed)
PROGRESS NOTE    Karen Potter  ZJI:[id number] DOB: 09/09/1940 DOA: 02/22/2018 PCP: Dettinger, Fransisca Kaufmann, MD     Brief Narrative:  77 y.o. female, with history of hypothyroidism, hypertension, diabetes mellitus, hyperparathyroidism, morbid obesity, obstructive sleep apnea diarrhea and vomiting.  Patient says that she has had diarrhea for past 2 days which did not get better off even after she took Imodium.  This morning she started vomiting.  She denies abdominal pain.  No chest pain or shortness of breath.  No dysuria urgency frequency of urination. She has history of seizures but is not on antiseizure medication, last seizure was many years ago No previous history of stroke No history of MI In the ED patient had temperature of 103 F, was tachycardic and tachypneic.  Code sepsis was initiated and patient started on IV vancomycin, cefepime, Flagyl.   Assessment & Plan: 1-Sepsis [facility]): in the setting of enterocolitis and possible UTI. -culture results pending  -C. Diff neg and MRSA PCR neg -will continue flagyl and cefepime  -continue IVF's -patient is afebrile -continue supportive care and continue full liquid diet   2-urinary retention -in the setting of acute infection -will place foley for now -follow response and discontinue as soon as possible  3-type 2 diabetes -continue SSI -A1C 6.5 -continue holding oral hypoglycemic regimen   4-vaginal sclerosis -will resume the use of clobetasol BID  5-hypothyroidism  -continue synthroid   6-hypokalemia: in the setting of diarrhea  -continue repletion as needed  -K 3.3 today  7-GERD -will add pepcid   8-AKI: in the setting of dehydration continue use of nephrotoxic agents and presumed UTI -continue IV antibiotics -continue IVF's -continue holding nephrotoxic agents  DVT prophylaxis: Lovenox Code Status: Full Family Communication: daughter at bedside  Disposition Plan: remains in stepdown for now. Continue  aggressive electrolytes repletion, continue IVF's, follow cultures results. Stop vancomycin in the setting of neg MRSA PCR. Follow clinical response.  Consultants:   None  Procedures:   See below for x-ray reports   Antimicrobials:  Anti-infectives (From admission, onward)   Start     Dose/Rate Route Frequency Ordered Stop   02/24/18 0100  vancomycin (VANCOCIN) 1,250 mg in sodium chloride 0.9 % 250 mL IVPB  Status:  Discontinued     1,250 mg 166.7 mL/hr over 90 Minutes Intravenous Every 24 hours 02/23/18 0750 02/24/18 1803   02/23/18 1200  ceFEPIme (MAXIPIME) 2 g in sodium chloride 0.9 % 100 mL IVPB     2 g 200 mL/hr over 30 Minutes Intravenous Every 12 hours 02/23/18 0747     02/23/18 0045  vancomycin (VANCOCIN) IVPB 1000 mg/200 mL premix     1,000 mg 200 mL/hr over 60 Minutes Intravenous  Once 02/23/18 0031 02/23/18 0425   02/23/18 0015  ceFEPIme (MAXIPIME) 2 g in sodium chloride 0.9 % 100 mL IVPB     2 g 200 mL/hr over 30 Minutes Intravenous  Once 02/23/18 0011 02/23/18 0159   02/23/18 0015  metroNIDAZOLE (FLAGYL) IVPB 500 mg     500 mg 100 mL/hr over 60 Minutes Intravenous Every 8 hours 02/23/18 0011     02/23/18 0015  vancomycin (VANCOCIN) IVPB 1000 mg/200 mL premix     1,000 mg 200 mL/hr over 60 Minutes Intravenous  Once 02/23/18 0011 02/23/18 0213     Subjective: Afebrile, overall feeling somewhat better. Still with ongoing diarrhea and also complaining of urinary retention. MRSA PCR neg.  Objective: Vitals:   02/24/18 1400 02/24/18 1500 02/24/18 1600  02/24/18 1700  BP: 119/80 123/81 111/78 109/82  Pulse: 90 78 81 73  Resp: (!) 21 (!) 21  17  Temp:   98.8 F (37.1 C)   TempSrc:   Oral   SpO2: 95% 95% 94% 93%  Weight:      Height:        Intake/Output Summary (Last 24 hours) at 02/24/2018 1804 Last data filed at 02/24/2018 1719 Gross per 24 hour  Intake 3111.13 ml  Output 1000 ml  Net 2111.13 ml   Filed Weights   02/22/18 2259 02/23/18 1550 02/24/18  0446  Weight: 113.4 kg 115.2 kg 117.4 kg    Examination: General exam: Alert, awake, oriented x 3; no CP, no nausea, no vomiting, reports mild abd discomfort. Still with ongoing diarrhea.  Respiratory system: Clear to auscultation. Respiratory effort normal. Cardiovascular system:RRR. No murmurs, rubs, gallops. Gastrointestinal system: Abdomen is distended, soft and just mildly tender on mid right side. No organomegaly or masses felt. Normal bowel sounds heard. Central nervous system: Alert and oriented. No focal neurological deficits. Extremities: No C/C/E, +pedal pulses Skin: No rashes, no petechiae. Psychiatry: Judgement and insight appear normal. Mood & affect appropriate.    Data Reviewed: I have personally reviewed following labs and imaging studies  CBC: Recent Labs  Lab 02/22/18 2338  WBC 22.6*  NEUTROABS 18.9*  HGB 14.1  HCT 44.3  MCV 93.5  PLT 025   Basic Metabolic Panel: Recent Labs  Lab 02/22/18 2338 02/23/18 0456 02/24/18 0412  NA 134* 135 137  K 3.3* 2.8* 3.3*  CL 101 105 109  CO2 20* 21* 20*  GLUCOSE 143* 166* 128*  BUN 25* 24* 18  CREATININE 1.31* 1.20* 0.89  0.94  CALCIUM 8.7* 8.1* 7.9*   GFR: Estimated Creatinine Clearance: 64.1 mL/min (by C-G formula based on SCr of 0.94 mg/dL).   Liver Function Tests: Recent Labs  Lab 02/22/18 2338 02/23/18 0456  AST 26 19  ALT 29 25  ALKPHOS 48 38  BILITOT 1.0 0.7  PROT 8.1 6.6  ALBUMIN 3.8 3.0*   BNP (last 3 results) Recent Labs    01/18/18 1146  PROBNP 68   HbA1C: Recent Labs    02/23/18 0456  HGBA1C 6.5*   CBG: Recent Labs  Lab 02/23/18 1130 02/23/18 1816 02/24/18 0000 02/24/18 0801 02/24/18 1601  GLUCAP 130* 98 100* 108* 129*   Urine analysis:    Component Value Date/Time   COLORURINE AMBER (A) 02/23/2018 0008   APPEARANCEUR HAZY (A) 02/23/2018 0008   APPEARANCEUR Clear 07/05/2017 1552   LABSPEC 1.023 02/23/2018 0008   PHURINE 5.0 02/23/2018 0008   GLUCOSEU NEGATIVE  02/23/2018 0008   HGBUR SMALL (A) 02/23/2018 0008   BILIRUBINUR NEGATIVE 02/23/2018 0008   BILIRUBINUR Negative 07/05/2017 1552   KETONESUR NEGATIVE 02/23/2018 0008   PROTEINUR 30 (A) 02/23/2018 0008   UROBILINOGEN negative 06/01/2013 1142   UROBILINOGEN 0.2 04/08/2012 0045   NITRITE POSITIVE (A) 02/23/2018 0008   LEUKOCYTESUR TRACE (A) 02/23/2018 0008   LEUKOCYTESUR 2+ (A) 07/05/2017 1552    Recent Results (from the past 240 hour(s))  Blood Culture (routine x 2)     Status: None (Preliminary result)   Collection Time: 02/23/18 12:13 AM  Result Value Ref Range Status   Specimen Description BLOOD LEFT HAND  Final   Special Requests   Final    BOTTLES DRAWN AEROBIC AND ANAEROBIC Blood Culture adequate volume   Culture   Final    NO GROWTH 1 DAY Performed at  [facility], [address], [place], [place] [address]    Report Status PENDING  Incomplete  Blood Culture (routine x 2)     Status: None (Preliminary result)   Collection Time: 02/23/18 12:47 AM  Result Value Ref Range Status   Specimen Description BLOOD LEFT HAND  Final   Special Requests   Final    BOTTLES DRAWN AEROBIC ONLY Blood Culture results may not be optimal due to an inadequate volume of blood received in culture bottles   Culture   Final    NO GROWTH 1 DAY Performed at [facility], [address] [place], [place] [address]    Report Status PENDING  Incomplete  C difficile quick scan w PCR reflex     Status: Abnormal   Collection Time: 02/23/18  8:51 AM  Result Value Ref Range Status   C Diff antigen (A) NEGATIVE Final    INVALID, POSSIBLE SAMPLE INTEGRITY COMPROMISED, TEST WAS REPEATED   C Diff toxin (A) NEGATIVE Final    INVALID, POSSIBLE SAMPLE INTEGRITY COMPROMISED, TEST WAS REPEATED   C Diff interpretation   Final    INVALID, POSSIBLE SAMPLE INTEGRITY COMPROMISED, TEST WAS REPEATED    Comment: PLEASE REFER TO CDPCR ON ACC [id number] FOR RESULTS. Performed at [facility], [address] [place],  [place] [address]   C. Diff by PCR, Reflexed     Status: None   Collection Time: 02/23/18  8:51 AM  Result Value Ref Range Status   Toxigenic C. Difficile by PCR NEGATIVE NEGATIVE Final    Comment: Patient is colonized with non toxigenic C. difficile. May not need treatment unless significant symptoms are present. Performed at [facility], [place] [address], [place], [place] [address]   MRSA PCR Screening     Status: None   Collection Time: 02/23/18  3:37 PM  Result Value Ref Range Status   MRSA by PCR NEGATIVE NEGATIVE Final    Comment:        The GeneXpert MRSA Assay (FDA approved for NASAL specimens only), is one component of a comprehensive MRSA colonization surveillance program. It is not intended to diagnose MRSA infection nor to guide or monitor treatment for MRSA infections. Performed at [facility], [address], [place], [place] [address]     Radiology Studies: Ct Abdomen Pelvis W Contrast  Result Date: 02/23/2018 CLINICAL DATA:  Vomiting and diarrhea since Monday. Weakness and slurring of speech. EXAM: CT ABDOMEN AND PELVIS WITH CONTRAST TECHNIQUE: Multidetector CT imaging of the abdomen and pelvis was performed using the standard protocol following bolus administration of intravenous contrast. CONTRAST:  1mL OMNIPAQUE IOHEXOL 300 MG/ML  SOLN COMPARISON:  MRI abdomen 07/19/2013 FINDINGS: Lower chest: Dependent atelectasis in the lung bases. Coronary artery calcifications. Hepatobiliary: Diffuse fatty infiltration of the liver. No focal liver lesions. Gallbladder is surgically absent. Mild extrahepatic bile duct dilatation is within normal range for postoperative patient. Pancreas: Unremarkable. No pancreatic ductal dilatation or surrounding inflammatory changes. Spleen: Normal in size without focal abnormality. Adrenals/Urinary Tract: Adrenal glands are unremarkable. Kidneys are normal, without renal calculi, focal lesion, or hydronephrosis. Bladder is unremarkable.  Stomach/Bowel: Stomach, small bowel, and colon are not abnormally distended. Stomach is decompressed. Fluid throughout the small bowel and colon with mild colonic wall thickening. Changes suggest enterocolitis. Diverticulosis of the sigmoid colon without evidence of diverticulitis. Appendix is not identified. Vascular/Lymphatic: Aortic atherosclerosis. No enlarged abdominal or pelvic lymph nodes. Reproductive: Status post hysterectomy. No adnexal masses. Other: No abdominal wall hernia or  abnormality. No abdominopelvic ascites. Musculoskeletal: Degenerative changes in the lumbar spine. Normal alignment. Postoperative posterior fixation of L4-L5. IMPRESSION: Fluid throughout the small bowel and colon with mild colonic wall thickening suggesting enterocolitis. Diffuse fatty infiltration of the liver. Aortic Atherosclerosis (ICD10-I70.0). Electronically Signed   By: Lucienne Capers M.D.   On: 02/23/2018 03:54   Dg Chest Port 1 View  Result Date: 02/23/2018 CLINICAL DATA:  Vomiting, diarrhea and weakness. EXAM: PORTABLE CHEST 1 VIEW COMPARISON:  11/07/2017 FINDINGS: Low lung volumes with mild crowding of interstitial lung markings and central vascular congestion. Heart is top-normal in size. Nonaneurysmal appearing thoracic aorta. Osteoarthritis of the included shoulders. IMPRESSION: Low lung volumes with crowding of interstitial markings and mild pulmonary vascular congestion. Electronically Signed   By: Ashley Royalty M.D.   On: 02/23/2018 00:09   Scheduled Meds: . clobetasol ointment   Topical BID  . enoxaparin (LOVENOX) injection  40 mg Subcutaneous Q24H  . Influenza vac split quadrivalent PF  0.5 mL Intramuscular Tomorrow-1000  . insulin aspart  0-9 Units Subcutaneous Q6H  . levothyroxine  150 mcg Oral Daily  . pneumococcal 23 valent vaccine  0.5 mL Intramuscular Tomorrow-1000   Continuous Infusions: . sodium chloride 100 mL/hr at 02/24/18 0804  . ceFEPime (MAXIPIME) IV 2 g (02/24/18 1328)  .  metronidazole 500 mg (02/24/18 1719)     LOS: 1 day    Time spent: 35 minutes. Greater than 50% of this time was spent in direct contact with the patient, coordinating care and discussing relevant ongoing clinical issues, including findings of neg C. Diff, pending GI panel and overall response to current treatment, with improvement in her lactic acid level, renal function and electrolytes. Patient continue to have ongoing diarrhea and is also having acute urinary retention.      Barton Dubois, MD [facility] Pager [phone]  If 7PM-7AM, please contact night-coverage [email or web address] Password [email or web address] 02/24/2018, 6:04 PM

## 2018-02-25 LAB — CBC
HCT: 41.3 % (ref 36.0–46.0)
Hemoglobin: 13 g/dL (ref 12.0–15.0)
MCH: 29 pg (ref 26.0–34.0)
MCHC: 31.5 g/dL (ref 30.0–36.0)
MCV: 92.2 fL (ref 80.0–100.0)
Platelets: 227 10*3/uL (ref 150–400)
RBC: 4.48 MIL/uL (ref 3.87–5.11)
RDW: 14.2 % (ref 11.5–15.5)
WBC: 11 10*3/uL — ABNORMAL HIGH (ref 4.0–10.5)
nRBC: 0 % (ref 0.0–0.2)

## 2018-02-25 LAB — GASTROINTESTINAL PANEL BY PCR, STOOL (REPLACES STOOL CULTURE)

## 2018-02-25 LAB — GLUCOSE, CAPILLARY
Glucose-Capillary: 119 mg/dL — ABNORMAL HIGH (ref 70–99)
Glucose-Capillary: 132 mg/dL — ABNORMAL HIGH (ref 70–99)
Glucose-Capillary: 99 mg/dL (ref 70–99)
Glucose-Capillary: 118 mg/dL — ABNORMAL HIGH (ref 70–99)

## 2018-02-25 LAB — BASIC METABOLIC PANEL
Anion gap: 6 (ref 5–15)
BUN: 15 mg/dL (ref 8–23)
CO2: 21 mmol/L — ABNORMAL LOW (ref 22–32)
Calcium: 8.2 mg/dL — ABNORMAL LOW (ref 8.9–10.3)
Chloride: 113 mmol/L — ABNORMAL HIGH (ref 98–111)
Creatinine, Ser: 0.77 mg/dL (ref 0.44–1.00)
GFR calc Af Amer: >60 mL/min (ref >60)
GFR calc non Af Amer: >60 mL/min (ref >60)
Glucose, Bld: 126 mg/dL — ABNORMAL HIGH (ref 70–99)
Potassium: 3.7 mmol/L (ref 3.5–5.1)
Sodium: 140 mmol/L (ref 135–145)

## 2018-02-25 LAB — URINE CULTURE: Culture: NO GROWTH

## 2018-02-25 LAB — MAGNESIUM: Magnesium: 2.1 mg/dL (ref 1.7–2.4)

## 2018-02-25 MED ORDER — PANTOPRAZOLE SODIUM 40 MG PO TBEC
40.0000 mg | DELAYED_RELEASE_TABLET | Freq: Every day | ORAL | Status: DC
  Administered 2018-02-25 – 2018-02-27 (×3): 40 mg via ORAL
  Filled 2018-02-25 (×3): qty 1

## 2018-02-25 MED ORDER — DICYCLOMINE HCL 10 MG PO CAPS
10.0000 mg | ORAL_CAPSULE | Freq: Three times a day (TID) | ORAL | Status: DC | PRN
  Administered 2018-02-25: 10 mg via ORAL
  Filled 2018-02-25: qty 1

## 2018-02-25 MED ORDER — SIMETHICONE 40 MG/0.6ML PO SUSP
40.0000 mg | Freq: Four times a day (QID) | ORAL | Status: DC | PRN
  Filled 2018-02-25: qty 0.6

## 2018-02-25 MED ORDER — CIPROFLOXACIN IN D5W 400 MG/200ML IV SOLN
400.0000 mg | Freq: Two times a day (BID) | INTRAVENOUS | Status: DC
  Administered 2018-02-26 – 2018-02-27 (×4): 400 mg via INTRAVENOUS
  Filled 2018-02-25 (×4): qty 200

## 2018-02-25 MED ORDER — INSULIN ASPART 100 UNIT/ML ~~LOC~~ SOLN
0.0000 [IU] | Freq: Three times a day (TID) | SUBCUTANEOUS | Status: DC
  Administered 2018-02-25 – 2018-02-27 (×4): 1 [IU] via SUBCUTANEOUS

## 2018-02-25 MED ORDER — FAMOTIDINE 20 MG PO TABS
20.0000 mg | ORAL_TABLET | Freq: Every day | ORAL | Status: DC
  Administered 2018-02-26: 20 mg via ORAL
  Filled 2018-02-25: qty 1

## 2018-02-25 NOTE — Progress Notes (Signed)
PROGRESS NOTE    Karen Potter  OAC:[id number] DOB: 20-Jun-1940 DOA: 02/22/2018 PCP: Dettinger, Fransisca Kaufmann, MD     Brief Narrative:  77 y.o. female, with history of hypothyroidism, hypertension, diabetes mellitus, hyperparathyroidism, morbid obesity, obstructive sleep apnea diarrhea and vomiting.  Patient says that she has had diarrhea for past 2 days which did not get better off even after she took Imodium.  This morning she started vomiting.  She denies abdominal pain.  No chest pain or shortness of breath.  No dysuria urgency frequency of urination. She has history of seizures but is not on antiseizure medication, last seizure was many years ago No previous history of stroke No history of MI In the ED patient had temperature of 103 F, was tachycardic and tachypneic.  Code sepsis was initiated and patient started on IV vancomycin, cefepime, Flagyl.   Assessment & Plan: 1-Sepsis [facility]): in the setting of enterocolitis and possible UTI. -culture results pending; GI panel was re-sent  -C. Diff neg and MRSA PCR neg -will continue flagyl and cefepime  -Resume PPI, start as needed Bentyl -continue IVF's, but adjust rate -patient has remained afebrile -continue supportive care and continue full liquid diet for now  2-urinary retention -in the setting of acute infection -Continue Foley catheter for now and follow urine cultures -Continue current IV antibiotics. -follow response and discontinue as soon as possible  3-type 2 diabetes -continue SSI -A1C 6.5 -continue holding oral hypoglycemic regimen while inpatient.  4-vaginal sclerosis -will continue the use of clobetasol BID  5-hypothyroidism  -continue synthroid   6-hypokalemia: in the setting of diarrhea  -continue repletion as needed  -K 3.7 today  7-GERD -will add pepcid   8-AKI: in the setting of dehydration continue use of nephrotoxic agents and presumed UTI -continue IV antibiotics -continue IVF's -continue  holding nephrotoxic agents -Renal function is back to normal now.  DVT prophylaxis: Lovenox Code Status: Full Family Communication: daughter at bedside  Disposition Plan: remains in stepdown for now. Continue aggressive electrolytes repletion, continue IVF's, follow cultures results.  Follow clinical response, start Bentyl, and resume PPI.  Patient C. difficile negative.  Consultants:   None  Procedures:   See below for x-ray reports   Antimicrobials:  Anti-infectives (From admission, onward)   Start     Dose/Rate Route Frequency Ordered Stop   02/24/18 0100  vancomycin (VANCOCIN) 1,250 mg in sodium chloride 0.9 % 250 mL IVPB  Status:  Discontinued     1,250 mg 166.7 mL/hr over 90 Minutes Intravenous Every 24 hours 02/23/18 0750 02/24/18 1803   02/23/18 1200  ceFEPIme (MAXIPIME) 2 g in sodium chloride 0.9 % 100 mL IVPB     2 g 200 mL/hr over 30 Minutes Intravenous Every 12 hours 02/23/18 0747     02/23/18 0045  vancomycin (VANCOCIN) IVPB 1000 mg/200 mL premix     1,000 mg 200 mL/hr over 60 Minutes Intravenous  Once 02/23/18 0031 02/23/18 0425   02/23/18 0015  ceFEPIme (MAXIPIME) 2 g in sodium chloride 0.9 % 100 mL IVPB     2 g 200 mL/hr over 30 Minutes Intravenous  Once 02/23/18 0011 02/23/18 0159   02/23/18 0015  metroNIDAZOLE (FLAGYL) IVPB 500 mg     500 mg 100 mL/hr over 60 Minutes Intravenous Every 8 hours 02/23/18 0011     02/23/18 0015  vancomycin (VANCOCIN) IVPB 1000 mg/200 mL premix     1,000 mg 200 mL/hr over 60 Minutes Intravenous  Once 02/23/18 0011 02/23/18 0109  Subjective: Afebrile, no chest pain, no shortness of breath, no nausea, no vomiting.  Continued to have crampy intermittent abdominal discomfort and ongoing diarrhea.  Objective: Vitals:   02/25/18 0500 02/25/18 0600 02/25/18 0700 02/25/18 0839  BP: 126/88 121/80 105/76   Pulse: 73 74 71   Resp: 19 17 17    Temp:      TempSrc:      SpO2: 96% 96% 94% 98%  Weight:      Height:         Intake/Output Summary (Last 24 hours) at 02/25/2018 1056 Last data filed at 02/25/2018 0700 Gross per 24 hour  Intake 980 ml  Output 1400 ml  Net -420 ml   Filed Weights   02/23/18 1550 02/24/18 0446 02/25/18 0358  Weight: 115.2 kg 117.4 kg 118.1 kg    Examination: General exam: Alert, awake, oriented x 3; no chest pain, no nausea, no vomiting.  Reports continued to experience intermittent abdominal pain mid abdomen and right flank, patient expressed feeling bloated and reports ongoing diarrhea. Respiratory system: Clear to auscultation. Respiratory effort normal. Cardiovascular system:RRR. No murmurs, rubs, gallops. Gastrointestinal system: Abdomen is obese, mildly distended, sore with deep palpation in the mid section of her abdomen, positive bowel sounds.  No guarding.   Central nervous system: Alert and oriented. No focal neurological deficits. Extremities: No cyanosis or clubbing, trace edema bilaterally.  Skin: No open wounds, irritation and redness in the outside skin of her vaginal area. No discharges, no petechiae.  Psychiatry: Judgement and insight appear normal. Mood & affect appropriate.   Data Reviewed: I have personally reviewed following labs and imaging studies  CBC: Recent Labs  Lab 02/22/18 2338 02/25/18 0900  WBC 22.6* 11.0*  NEUTROABS 18.9*  --   HGB 14.1 13.0  HCT 44.3 41.3  MCV 93.5 92.2  PLT 246 390   Basic Metabolic Panel: Recent Labs  Lab 02/22/18 2338 02/23/18 0456 02/24/18 0412 02/25/18 0900  NA 134* 135 137 140  K 3.3* 2.8* 3.3* 3.7  CL 101 105 109 113*  CO2 20* 21* 20* 21*  GLUCOSE 143* 166* 128* 126*  BUN 25* 24* 18 15  CREATININE 1.31* 1.20* 0.89  0.94 0.77  CALCIUM 8.7* 8.1* 7.9* 8.2*  MG  --   --   --  2.1   GFR: Estimated Creatinine Clearance: 75.7 mL/min (by C-G formula based on SCr of 0.77 mg/dL).   Liver Function Tests: Recent Labs  Lab 02/22/18 2338 02/23/18 0456  AST 26 19  ALT 29 25  ALKPHOS 48 38  BILITOT  1.0 0.7  PROT 8.1 6.6  ALBUMIN 3.8 3.0*   BNP (last 3 results) Recent Labs    01/18/18 1146  PROBNP 68   HbA1C: Recent Labs    02/23/18 0456  HGBA1C 6.5*   CBG: Recent Labs  Lab 02/24/18 0000 02/24/18 0801 02/24/18 1601 02/24/18 2301 02/25/18 0914  GLUCAP 100* 108* 129* 92 119*   Urine analysis:    Component Value Date/Time   COLORURINE AMBER (A) 02/23/2018 0008   APPEARANCEUR HAZY (A) 02/23/2018 0008   APPEARANCEUR Clear 07/05/2017 1552   LABSPEC 1.023 02/23/2018 0008   PHURINE 5.0 02/23/2018 0008   GLUCOSEU NEGATIVE 02/23/2018 0008   HGBUR SMALL (A) 02/23/2018 0008   BILIRUBINUR NEGATIVE 02/23/2018 0008   BILIRUBINUR Negative 07/05/2017 1552   KETONESUR NEGATIVE 02/23/2018 0008   PROTEINUR 30 (A) 02/23/2018 0008   UROBILINOGEN negative 06/01/2013 1142   UROBILINOGEN 0.2 04/08/2012 0045   NITRITE POSITIVE (A)  02/23/2018 0008   LEUKOCYTESUR TRACE (A) 02/23/2018 0008   LEUKOCYTESUR 2+ (A) 07/05/2017 1552    Recent Results (from the past 240 hour(s))  Urine culture     Status: None   Collection Time: 02/23/18 12:08 AM  Result Value Ref Range Status   Specimen Description   Final    URINE, RANDOM Performed at [facility], [address], [place], [place] [address]    Special Requests   Final    NONE Performed at [facility], [address] [place] [address] [place], [place] [address]    Culture   Final    NO GROWTH Performed at [facility], [place] [address], [place], [place] [address]    Report Status 02/25/2018 FINAL  Final  Blood Culture (routine x 2)     Status: None (Preliminary result)   Collection Time: 02/23/18 12:13 AM  Result Value Ref Range Status   Specimen Description BLOOD LEFT HAND  Final   Special Requests   Final    BOTTLES DRAWN AEROBIC AND ANAEROBIC Blood Culture adequate volume   Culture   Final    NO GROWTH 2 DAYS Performed at [facility], [address] [place], [place] [address]    Report Status PENDING  Incomplete  Blood Culture  (routine x 2)     Status: None (Preliminary result)   Collection Time: 02/23/18 12:47 AM  Result Value Ref Range Status   Specimen Description BLOOD LEFT HAND  Final   Special Requests   Final    BOTTLES DRAWN AEROBIC ONLY Blood Culture results may not be optimal due to an inadequate volume of blood received in culture bottles   Culture   Final    NO GROWTH 2 DAYS Performed at [facility], [address] [place], [place] [address]    Report Status PENDING  Incomplete  C difficile quick scan w PCR reflex     Status: Abnormal   Collection Time: 02/23/18  8:51 AM  Result Value Ref Range Status   C Diff antigen (A) NEGATIVE Final    INVALID, POSSIBLE SAMPLE INTEGRITY COMPROMISED, TEST WAS REPEATED   C Diff toxin (A) NEGATIVE Final    INVALID, POSSIBLE SAMPLE INTEGRITY COMPROMISED, TEST WAS REPEATED   C Diff interpretation   Final    INVALID, POSSIBLE SAMPLE INTEGRITY COMPROMISED, TEST WAS REPEATED    Comment: PLEASE REFER TO CDPCR ON ACC [id number] FOR RESULTS. Performed at [facility], [address], [place], [place] [address]   C. Diff by PCR, Reflexed     Status: None   Collection Time: 02/23/18  8:51 AM  Result Value Ref Range Status   Toxigenic C. Difficile by PCR NEGATIVE NEGATIVE Final    Comment: Patient is colonized with non toxigenic C. difficile. May not need treatment unless significant symptoms are present. Performed at [facility], [place] [address], [place], [place] [address]   MRSA PCR Screening     Status: None   Collection Time: 02/23/18  3:37 PM  Result Value Ref Range Status   MRSA by PCR NEGATIVE NEGATIVE Final    Comment:        The GeneXpert MRSA Assay (FDA approved for NASAL specimens only), is one component of a comprehensive MRSA colonization surveillance program. It is not intended to diagnose MRSA infection nor to guide or monitor treatment for MRSA infections. Performed at [facility], [address] [place], [place] [address]     Radiology  Studies: No results found. Scheduled Meds: .  clobetasol ointment   Topical BID  . enoxaparin (LOVENOX) injection  40 mg Subcutaneous Q24H  . [START ON 02/26/2018] famotidine  20 mg Oral QHS  . Influenza vac split quadrivalent PF  0.5 mL Intramuscular Tomorrow-1000  . insulin aspart  0-9 Units Subcutaneous TID AC & HS  . levothyroxine  150 mcg Oral Daily  . pantoprazole  40 mg Oral Daily  . pneumococcal 23 valent vaccine  0.5 mL Intramuscular Tomorrow-1000   Continuous Infusions: . sodium chloride 100 mL/hr at 02/25/18 8563  . ceFEPime (MAXIPIME) IV Stopped (02/25/18 0101)  . metronidazole 500 mg (02/25/18 0801)     LOS: 2 days    Time spent: 35 minutes.  Greater than 50% of this time has been spent in direct contact with the patient; coordinating care and discussing relevant ongoing clinical issues, including findings on her blood work demonstrating a stabilization of electrolytes and renal function, need for repeat GI panel, discussion about negative MRSA by PCR and discontinuation of vancomycin.  Patient urinalysis suggesting underlying infection and at this moment we have discussed the need to continue cefepime and Flagyl as part of her antibiotics regimen.  Patient's care has been discussed with nursing staff and all her questions has been answered appropriately.   Barton Dubois, MD [facility] Pager [phone]  If 7PM-7AM, please contact night-coverage [email or web address] Password [email or web address] 02/25/2018, 10:56 AM

## 2018-02-26 DIAGNOSIS — K219 Gastro-esophageal reflux disease without esophagitis: Secondary | ICD-10-CM

## 2018-02-26 DIAGNOSIS — I10 Essential (primary) hypertension: Secondary | ICD-10-CM

## 2018-02-26 LAB — GLUCOSE, CAPILLARY
Glucose-Capillary: 91 mg/dL (ref 70–99)
Glucose-Capillary: 126 mg/dL — ABNORMAL HIGH (ref 70–99)
Glucose-Capillary: 108 mg/dL — ABNORMAL HIGH (ref 70–99)
Glucose-Capillary: 134 mg/dL — ABNORMAL HIGH (ref 70–99)

## 2018-02-26 NOTE — Progress Notes (Addendum)
PROGRESS NOTE    Karen Potter  XTK:[id number] DOB: [birth year] DOA: 02/22/2018 PCP: Dettinger, Fransisca Kaufmann, MD     Brief Narrative:  77 y.o. female, with history of hypothyroidism, hypertension, diabetes mellitus, hyperparathyroidism, morbid obesity, obstructive sleep apnea diarrhea and vomiting.  Patient says that she has had diarrhea for past 2 days which did not get better off even after she took Imodium.  This morning she started vomiting.  She denies abdominal pain.  No chest pain or shortness of breath.  No dysuria urgency frequency of urination. She has history of seizures but is not on antiseizure medication, last seizure was many years ago No previous history of stroke No history of MI In the ED patient had temperature of 103 F, was tachycardic and tachypneic.  Code sepsis was initiated and patient started on IV vancomycin, cefepime, Flagyl.   Assessment & Plan: 1-Sepsis [facility]): in the setting of Salmonella enterocolitis and possible UTI. -C. Diff neg and MRSA PCR neg -GI panel demonstrated acute Salmonella infection -Antibiotic has been transitioned to ciprofloxacin (currently given through her veins until proven that she can tolerate p.o. diet, hydration and medications). -Continue PPI, Pepcid, as needed Bentyl and as needed antiemetics. -Advised to keep herself well-hydrated by mouth and discontinue IV fluids now that her diet has been advanced. -patient has remained afebrile, WBC is down to 1100 and no signs of systemic infection currently. -continue supportive care and advance diet.  2-urinary retention -in the setting of acute infection -Continue Foley catheter for now and follow urine cultures -Continue current IV antibiotics (patient was receiving cefepime initially) now antibiotic has been narrowed to the use of ciprofloxacin which will also cover an infection in her urine).. -follow response and discontinue as soon as possible  3-type 2 diabetes -continue SSI -A1C  6.5 -continue holding oral hypoglycemic regimen while inpatient.  4-vaginal sclerosis -will continue the use of clobetasol BID -Patient reports improvement in her vaginal irritation.  5-hypothyroidism  -continue synthroid   6-hypokalemia: in the setting of diarrhea  -continue repletion as needed  -Potassium and magnesium within normal limits.  7-GERD -will continue PPI and Pepcid.  8-AKI: in the setting of dehydration continue use of nephrotoxic agents and presumed UTI -continue IV antibiotics -Follow urine culture -Advised to keep herself well-hydrated; we will be stopping IV fluids at this time. -Diet has been advanced -Follow renal function trend. -Creatinine within normal limits.  9-morbid obesity -Body mass index is 44.46 kg/m. -Low calorie diet, portion control and increase physical activity has been discussed with patient.  DVT prophylaxis: Lovenox Code Status: Full Family Communication: daughter at bedside  Disposition Plan: Patient will be transferred to [place]; diet will be advanced, continue IV ciprofloxacin now that the microorganism causing her problem has been identified.  Continue PPI, as needed antiemetics, and add Florastor twice a day.  Increase physical activity.  Hopefully discharge home in the next 24-48 hours.  Consultants:   None  Procedures:   See below for x-ray reports   Antimicrobials:  Anti-infectives (From admission, onward)   Start     Dose/Rate Route Frequency Ordered Stop   02/25/18 1900  ciprofloxacin (CIPRO) IVPB 400 mg     400 mg 200 mL/hr over 60 Minutes Intravenous Every 12 hours 02/25/18 1854     02/24/18 0100  vancomycin (VANCOCIN) 1,250 mg in sodium chloride 0.9 % 250 mL IVPB  Status:  Discontinued     1,250 mg 166.7 mL/hr over 90 Minutes Intravenous Every 24 hours 02/23/18 0750  02/24/18 1803   02/23/18 1200  ceFEPIme (MAXIPIME) 2 g in sodium chloride 0.9 % 100 mL IVPB  Status:  Discontinued     2 g 200 mL/hr over 30  Minutes Intravenous Every 12 hours 02/23/18 0747 02/25/18 1937   02/23/18 0045  vancomycin (VANCOCIN) IVPB 1000 mg/200 mL premix     1,000 mg 200 mL/hr over 60 Minutes Intravenous  Once 02/23/18 0031 02/23/18 0425   02/23/18 0015  ceFEPIme (MAXIPIME) 2 g in sodium chloride 0.9 % 100 mL IVPB     2 g 200 mL/hr over 30 Minutes Intravenous  Once 02/23/18 0011 02/23/18 0159   02/23/18 0015  metroNIDAZOLE (FLAGYL) IVPB 500 mg  Status:  Discontinued     500 mg 100 mL/hr over 60 Minutes Intravenous Every 8 hours 02/23/18 0011 02/25/18 1854   02/23/18 0015  vancomycin (VANCOCIN) IVPB 1000 mg/200 mL premix     1,000 mg 200 mL/hr over 60 Minutes Intravenous  Once 02/23/18 0011 02/23/18 0213     Subjective: No fever, no chest pain, no shortness of breath.  Patient is present no nausea or vomiting.  Abdominal discomfort significantly improved and she reported also improvement in her diarrhea.  Objective: Vitals:   02/26/18 0500 02/26/18 0600 02/26/18 0700 02/26/18 0800  BP: 113/77 101/66 108/75 120/79  Pulse: 61 62 61 63  Resp:    18  Temp:    97.9 F (36.6 C)  TempSrc:   Oral Oral  SpO2: 96% 94% 95% 96%  Weight: 117.5 kg     Height:        Intake/Output Summary (Last 24 hours) at 02/26/2018 1046 Last data filed at 02/26/2018 0500 Gross per 24 hour  Intake 3235.89 ml  Output 2200 ml  Net 1035.89 ml   Filed Weights   02/24/18 0446 02/25/18 0358 02/26/18 0500  Weight: 117.4 kg 118.1 kg 117.5 kg    Examination: General exam: Alert, awake, oriented x 3; afebrile, no nausea, no vomiting.  Reports improvement in her abdominal discomfort.  Still having some bloating sensation and is having diarrhea; expressed that her diarrhea is much better today. Respiratory system: Clear to auscultation. Respiratory effort normal. Cardiovascular system:RRR. No murmurs, rubs, gallops. Gastrointestinal system: Abdomen is obese, slightly distended, soft and nontender currently. No organomegaly or masses  felt. Normal bowel sounds heard. Central nervous system: Alert and oriented. No focal neurological deficits. Extremities: No cyanosis or clubbing. Skin: No rashes, lesions or ulcers Psychiatry: Judgement and insight appear normal. Mood & affect appropriate.   Data Reviewed: I have personally reviewed following labs and imaging studies  CBC: Recent Labs  Lab 02/22/18 2338 02/25/18 0900  WBC 22.6* 11.0*  NEUTROABS 18.9*  --   HGB 14.1 13.0  HCT 44.3 41.3  MCV 93.5 92.2  PLT 246 702   Basic Metabolic Panel: Recent Labs  Lab 02/22/18 2338 02/23/18 0456 02/24/18 0412 02/25/18 0900  NA 134* 135 137 140  K 3.3* 2.8* 3.3* 3.7  CL 101 105 109 113*  CO2 20* 21* 20* 21*  GLUCOSE 143* 166* 128* 126*  BUN 25* 24* 18 15  CREATININE 1.31* 1.20* 0.89  0.94 0.77  CALCIUM 8.7* 8.1* 7.9* 8.2*  MG  --   --   --  2.1   GFR: Estimated Creatinine Clearance: 75.4 mL/min (by C-G formula based on SCr of 0.77 mg/dL).   Liver Function Tests: Recent Labs  Lab 02/22/18 2338 02/23/18 0456  AST 26 19  ALT 29 25  ALKPHOS 48  38  BILITOT 1.0 0.7  PROT 8.1 6.6  ALBUMIN 3.8 3.0*   BNP (last 3 results) Recent Labs    01/18/18 1146  PROBNP 68   CBG: Recent Labs  Lab 02/25/18 0914 02/25/18 1149 02/25/18 1703 02/25/18 2112 02/26/18 0808  GLUCAP 119* 132* 99 118* 91   Urine analysis:    Component Value Date/Time   COLORURINE AMBER (A) 02/23/2018 0008   APPEARANCEUR HAZY (A) 02/23/2018 0008   APPEARANCEUR Clear 07/05/2017 1552   LABSPEC 1.023 02/23/2018 0008   PHURINE 5.0 02/23/2018 0008   GLUCOSEU NEGATIVE 02/23/2018 0008   HGBUR SMALL (A) 02/23/2018 0008   BILIRUBINUR NEGATIVE 02/23/2018 0008   BILIRUBINUR Negative 07/05/2017 1552   KETONESUR NEGATIVE 02/23/2018 0008   PROTEINUR 30 (A) 02/23/2018 0008   UROBILINOGEN negative 06/01/2013 1142   UROBILINOGEN 0.2 04/08/2012 0045   NITRITE POSITIVE (A) 02/23/2018 0008   LEUKOCYTESUR TRACE (A) 02/23/2018 0008   LEUKOCYTESUR 2+  (A) 07/05/2017 1552    Recent Results (from the past 240 hour(s))  Urine culture     Status: None   Collection Time: 02/23/18 12:08 AM  Result Value Ref Range Status   Specimen Description   Final    URINE, RANDOM Performed at [facility], [address], [place], [place] [address]    Special Requests   Final    NONE Performed at [facility], [address], [place], [place] [address]    Culture   Final    NO GROWTH Performed at [facility], [place] [address] [place], [place] [address]    Report Status 02/25/2018 FINAL  Final  Blood Culture (routine x 2)     Status: None (Preliminary result)   Collection Time: 02/23/18 12:13 AM  Result Value Ref Range Status   Specimen Description BLOOD LEFT HAND  Final   Special Requests   Final    BOTTLES DRAWN AEROBIC AND ANAEROBIC Blood Culture adequate volume   Culture   Final    NO GROWTH 3 DAYS Performed at [facility], [address] [place], [place] [address]    Report Status PENDING  Incomplete  Blood Culture (routine x 2)     Status: None (Preliminary result)   Collection Time: 02/23/18 12:47 AM  Result Value Ref Range Status   Specimen Description BLOOD LEFT HAND  Final   Special Requests   Final    BOTTLES DRAWN AEROBIC ONLY Blood Culture results may not be optimal due to an inadequate volume of blood received in culture bottles   Culture   Final    NO GROWTH 3 DAYS Performed at [facility], [address] [place], [place] [address]    Report Status PENDING  Incomplete  C difficile quick scan w PCR reflex     Status: Abnormal   Collection Time: 02/23/18  8:51 AM  Result Value Ref Range Status   C Diff antigen (A) NEGATIVE Final    INVALID, POSSIBLE SAMPLE INTEGRITY COMPROMISED, TEST WAS REPEATED   C Diff toxin (A) NEGATIVE Final    INVALID, POSSIBLE SAMPLE INTEGRITY COMPROMISED, TEST WAS REPEATED   C Diff interpretation   Final    INVALID, POSSIBLE SAMPLE INTEGRITY COMPROMISED, TEST WAS REPEATED    Comment:  PLEASE REFER TO CDPCR ON ACC C12751 FOR RESULTS. Performed at [facility], [address] [place], [place] [address]   C. Diff by PCR, Reflexed     Status: None   Collection Time: 02/23/18  8:51 AM  Result Value  Ref Range Status   Toxigenic C. Difficile by PCR NEGATIVE NEGATIVE Final    Comment: Patient is colonized with non toxigenic C. difficile. May not need treatment unless significant symptoms are present. Performed at [facility], [place] [address] [place], [place] [address]   MRSA PCR Screening     Status: None   Collection Time: 02/23/18  3:37 PM  Result Value Ref Range Status   MRSA by PCR NEGATIVE NEGATIVE Final    Comment:        The GeneXpert MRSA Assay (FDA approved for NASAL specimens only), is one component of a comprehensive MRSA colonization surveillance program. It is not intended to diagnose MRSA infection nor to guide or monitor treatment for MRSA infections. Performed at [facility], [address] [place], [place] [address]   Gastrointestinal Panel by PCR , Stool     Status: Abnormal   Collection Time: 02/24/18  8:35 PM  Result Value Ref Range Status   Campylobacter species NOT DETECTED NOT DETECTED Final   Plesimonas shigelloides NOT DETECTED NOT DETECTED Final   Salmonella species DETECTED (A) NOT DETECTED Final    Comment: RESULT CALLED TO, READ BACK BY AND VERIFIED WITH: LESLIE MARTIN @ 1806 ON 02/25/2018 BY [facility]    Yersinia enterocolitica NOT DETECTED NOT DETECTED Final   Vibrio species NOT DETECTED NOT DETECTED Final   Vibrio cholerae NOT DETECTED NOT DETECTED Final   Enteroaggregative E coli (EAEC) NOT DETECTED NOT DETECTED Final   Enteropathogenic E coli (EPEC) NOT DETECTED NOT DETECTED Final   Enterotoxigenic E coli (ETEC) NOT DETECTED NOT DETECTED Final   Shiga like toxin producing E coli (STEC) NOT DETECTED NOT DETECTED Final   Shigella/Enteroinvasive E coli (EIEC) NOT DETECTED NOT DETECTED Final   Cryptosporidium NOT DETECTED NOT  DETECTED Final   Cyclospora cayetanensis NOT DETECTED NOT DETECTED Final   Entamoeba histolytica NOT DETECTED NOT DETECTED Final   Giardia lamblia NOT DETECTED NOT DETECTED Final   Adenovirus F40/41 NOT DETECTED NOT DETECTED Final   Astrovirus NOT DETECTED NOT DETECTED Final   Norovirus GI/GII NOT DETECTED NOT DETECTED Final   Rotavirus A NOT DETECTED NOT DETECTED Final   Sapovirus (I, II, IV, and V) NOT DETECTED NOT DETECTED Final    Comment: Performed at [facility], [address] [place], [place] [address]    Radiology Studies: No results found. Scheduled Meds: . clobetasol ointment   Topical BID  . enoxaparin (LOVENOX) injection  40 mg Subcutaneous Q24H  . famotidine  20 mg Oral QHS  . Influenza vac split quadrivalent PF  0.5 mL Intramuscular Tomorrow-1000  . insulin aspart  0-9 Units Subcutaneous TID AC & HS  . levothyroxine  150 mcg Oral Daily  . pantoprazole  40 mg Oral Daily  . pneumococcal 23 valent vaccine  0.5 mL Intramuscular Tomorrow-1000   Continuous Infusions: . sodium chloride 75 mL/hr at 02/25/18 1205  . ciprofloxacin 400 mg (02/26/18 0825)     LOS: 3 days    Time spent: 30 minutes.    Barton Dubois, MD [facility] Pager [phone]  If 7PM-7AM, please contact night-coverage [email or web address] Password [email or web address] 02/26/2018, 10:46 AM

## 2018-02-27 DIAGNOSIS — A029 Salmonella infection, unspecified: Secondary | ICD-10-CM

## 2018-02-27 DIAGNOSIS — N179 Acute kidney failure, unspecified: Secondary | ICD-10-CM

## 2018-02-27 DIAGNOSIS — R338 Other retention of urine: Secondary | ICD-10-CM

## 2018-02-27 DIAGNOSIS — E860 Dehydration: Secondary | ICD-10-CM

## 2018-02-27 LAB — BASIC METABOLIC PANEL
Anion gap: 8 (ref 5–15)
BUN: 10 mg/dL (ref 8–23)
CO2: 21 mmol/L — ABNORMAL LOW (ref 22–32)
Calcium: 8.1 mg/dL — ABNORMAL LOW (ref 8.9–10.3)
Chloride: 111 mmol/L (ref 98–111)
Creatinine, Ser: 0.7 mg/dL (ref 0.44–1.00)
GFR calc Af Amer: >60 mL/min (ref >60)
GFR calc non Af Amer: >60 mL/min (ref >60)
Glucose, Bld: 109 mg/dL — ABNORMAL HIGH (ref 70–99)
Potassium: 3.2 mmol/L — ABNORMAL LOW (ref 3.5–5.1)
Sodium: 140 mmol/L (ref 135–145)

## 2018-02-27 LAB — CBC
HCT: 38.7 % (ref 36.0–46.0)
Hemoglobin: 12.7 g/dL (ref 12.0–15.0)
MCH: 30.2 pg (ref 26.0–34.0)
MCHC: 32.8 g/dL (ref 30.0–36.0)
MCV: 91.9 fL (ref 80.0–100.0)
Platelets: 241 10*3/uL (ref 150–400)
RBC: 4.21 MIL/uL (ref 3.87–5.11)
RDW: 14.1 % (ref 11.5–15.5)
WBC: 10.7 10*3/uL — ABNORMAL HIGH (ref 4.0–10.5)
nRBC: 0 % (ref 0.0–0.2)

## 2018-02-27 LAB — GLUCOSE, CAPILLARY
Glucose-Capillary: 103 mg/dL — ABNORMAL HIGH (ref 70–99)
Glucose-Capillary: 126 mg/dL — ABNORMAL HIGH (ref 70–99)

## 2018-02-27 MED ORDER — CIPROFLOXACIN HCL 500 MG PO TABS: 500.0000 mg | ORAL_TABLET | Freq: Two times a day (BID) | ORAL | 0 refills | Status: AC

## 2018-02-27 MED ORDER — SACCHAROMYCES BOULARDII 250 MG PO CAPS
250.0000 mg | ORAL_CAPSULE | Freq: Two times a day (BID) | ORAL | Status: DC
  Administered 2018-02-27: 250 mg via ORAL
  Filled 2018-02-27: qty 1

## 2018-02-27 MED ORDER — LOSARTAN POTASSIUM 25 MG PO TABS: 25.0000 mg | ORAL_TABLET | Freq: Every day | ORAL | 2 refills | Status: DC

## 2018-02-27 MED ORDER — SACCHAROMYCES BOULARDII 250 MG PO CAPS: 250.0000 mg | ORAL_CAPSULE | Freq: Two times a day (BID) | ORAL | 0 refills | Status: DC

## 2018-02-27 MED ORDER — POTASSIUM CHLORIDE CRYS ER 20 MEQ PO TBCR
40.0000 meq | EXTENDED_RELEASE_TABLET | Freq: Every day | ORAL | Status: DC
  Administered 2018-02-27: 40 meq via ORAL
  Filled 2018-02-27: qty 2

## 2018-02-27 MED ORDER — FUROSEMIDE 40 MG PO TABS: 40.0000 mg | ORAL_TABLET | Freq: Every day | ORAL | 1 refills | Status: DC

## 2018-02-27 NOTE — Progress Notes (Signed)
Foley catheter removed. PT able to void within 15 minutes of removal. Continue to monitor.

## 2018-02-27 NOTE — Discharge Summary (Signed)
Physician Discharge Summary  Karen Potter JXB:[id number] DOB: 01/12/1941 DOA: 02/22/2018  PCP: Dettinger, Fransisca Kaufmann, MD  Admit date: 02/22/2018 Discharge date: 02/27/2018  Time spent: 35 minutes  Recommendations for Outpatient Follow-up:  1. Reassess BMET to follow electrolytes and renal function  2. Please follow VS and adjust antihypertensive regimen as needed  3. Follow complete resolution of her symptoms from salmonella infection.   Discharge Diagnoses:  Active Problems:   Obesity, Class III, BMI 40-49.9 (morbid obesity) ([place])   Sepsis ([place])   AKI (acute kidney injury) ([place])   Dehydration   Salmonella   Acute urinary retention   Discharge Condition: stable and improved. Discharge home with instructions to follow up with PCP in 10 days.  Diet recommendation: heart healthy, modified carbohydrates and low calorie diet   Filed Weights   02/25/18 0358 02/26/18 0500 02/27/18 0027  Weight: 118.1 kg 117.5 kg 117.6 kg    History of present illness:  77 y.o.female,with history of hypothyroidism, hypertension, diabetes mellitus, hyperparathyroidism, morbid obesity, obstructive sleep apnea diarrhea and vomiting. Patient says that she has had diarrhea for past 2 days which did not get better off even after she took Imodium. This morning she started vomiting. She denies abdominal pain. No chest pain or shortness of breath. No dysuria urgency frequency of urination. She has history of seizures but is not on antiseizure medication,last seizure was many years ago No previous history of stroke No history of MI In the ED patient had temperature of 103 F, was tachycardic and tachypneic. Code sepsis was initiated and patient started on IV vancomycin, cefepime, Flagyl.  Hospital Course:  1-Sepsis [facility]): in the setting of Salmonella enterocolitis and possible UTI. -C. Diff neg and MRSA PCR neg -GI panel demonstrated acute Salmonella infection -Antibiotic has been transitioned to  ciprofloxacin (as primary choice for salmonella treatment. She will complete antibiotics by mouth at discharge).  -Continue PPI, Pepcid, as needed Bentyl and as needed antiemetics. -Advised to keep herself well-hydrated by mouth and to use florastor  -patient has remained afebrile, WBC is down to 10,000 range and no signs of systemic infection currently. -tolerating diet w/o problems   2-urinary retention -in the setting of presumed infection -foley able to be removed and patient urinating without problems. -Continue current oral antibiotics for salmonella, which empirically will cover urine infections anyway.  -advise to keep herself well hydrated  3-type 2 diabetes -modified carb diet encouraged  -A1C 6.5 -resume prior to admission hypoglycemic regimen   4-vaginal sclerosis -will continue the use of clobetasol BID -improvement in her vaginal irritation reported. -continue outpatient follow up.  5-hypothyroidism  -continue synthroid   6-hypokalemia: in the setting of diarrhea  -repleted -at discharge will resume use of ARB, which would help with potassium retention -repeat BMET and further repletion needed.  7-GERD -will continue PPI   8-AKI: in the setting of dehydration continue use of nephrotoxic agents and presumed UTI. -no dysuria at discharge and able to pee without problems. -no growth on urine culture. -Advised to keep herself well-hydrated -discharge on cipro for treatment of salmonella. ; we will be stopping IV fluids at this time. -Creatinine within normal limits.  9-morbid obesity -Body mass index is 44.46 kg/m. -Low calorie diet, portion control and increase physical activity has been discussed with patient.  Procedures:  See below for x-ray reports   Consultations:  None   Discharge Exam: Vitals:   02/27/18 0453 02/27/18 1347  BP: 135/76 112/74  Pulse: 71 72  Resp: 16  15  Temp: 98.1 F (36.7 C) 98 F (36.7 C)  SpO2: 95% 96%    General exam: Alert, awake, oriented x 3; afebrile, no nausea, no vomiting.  Reports no abd pain; significant improvement in her diarrhea; flexiseal removed; able to pee on her own and not longer requiring the use of foley. Patient tolerated diet without problems. Respiratory system: Clear to auscultation. Respiratory effort normal. Cardiovascular system:RRR. No murmurs, rubs, gallops. Gastrointestinal system: Abdomen is obese, slightly distended, soft and nontender currently. No organomegaly or masses felt. Normal bowel sounds heard. Central nervous system: Alert and oriented. No focal neurological deficits. Extremities: No cyanosis or clubbing. Skin: No rashes, lesions or ulcers Psychiatry: Judgement and insight appear normal. Mood & affect appropriate.    Discharge Instructions   Discharge Instructions    Diet - low sodium heart healthy   Complete by:  As directed    Discharge instructions   Complete by:  As directed    Take medications as prescribed  Keep yourself well hydrated Follow low sodium diet (2000-2500 mg daily) Follow up with PCP in 10 days   Increase activity slowly   Complete by:  As directed      Allergies as of 02/27/2018      Reactions   Codeine Itching   Dilaudid [hydromorphone] Other (See Comments)   Mouth blisters   Other Itching   Most pain meds cause itching.  When has to take pain medication, she has been instructed to take Benadryl   Doxycycline Rash      Medication List    STOP taking these medications   losartan-hydrochlorothiazide 100-25 MG tablet Commonly known as:  HYZAAR     TAKE these medications   aspirin 325 MG tablet Take 325 mg by mouth every evening.   CALCIUM 600 600 MG Tabs tablet Generic drug:  calcium carbonate Take 600 mg by mouth 2 (two) times daily with a meal.   carvedilol 6.25 MG tablet Commonly known as:  COREG TAKE 1 TABLET BY MOUTH TWICE DAILY WITH MEALS   cetirizine 10 MG tablet Commonly known as:   ZYRTEC Take 1 tablet (10 mg total) by mouth daily.   ciprofloxacin 500 MG tablet Commonly known as:  CIPRO Take 1 tablet (500 mg total) by mouth 2 (two) times daily for 6 days.   clobetasol ointment 0.05 % Commonly known as:  TEMOVATE Apply 1 application topically as needed.   clotrimazole-betamethasone cream Commonly known as:  LOTRISONE Apply 1 application topically as needed.   FISH OIL TRIPLE STRENGTH 1400 MG Caps Take 1,400 mg by mouth 4 (four) times daily.   fluticasone 50 MCG/ACT nasal spray Commonly known as:  FLONASE Place 2 sprays into both nostrils daily.   furosemide 40 MG tablet Commonly known as:  LASIX Take 1 tablet (40 mg total) by mouth daily. What changed:    medication strength  how much to take  when to take this   glimepiride 2 MG tablet Commonly known as:  AMARYL Take 2 mg by mouth daily with breakfast.   hydrocortisone 2.5 % rectal cream Commonly known as:  ANUSOL-HC Place 1 application rectally 2 (two) times daily.   levothyroxine 150 MCG tablet Commonly known as:  SYNTHROID, LEVOTHROID TAKE 1 TABLET BY MOUTH ONCE DAILY   losartan 25 MG tablet Commonly known as:  COZAAR Take 1 tablet (25 mg total) by mouth daily.   Magnesium 250 MG Tabs Take 250 mg by mouth 2 (two) times daily.   omeprazole 20  MG capsule Commonly known as:  PRILOSEC Take 1 capsule (20 mg total) by mouth 2 (two) times daily before a meal.   ondansetron 4 MG disintegrating tablet Commonly known as:  ZOFRAN-ODT Take 1 tablet (4 mg total) by mouth every 8 (eight) hours as needed for nausea or vomiting.   saccharomyces boulardii 250 MG capsule Commonly known as:  FLORASTOR Take 1 capsule (250 mg total) by mouth 2 (two) times daily.   Vitamin D3 2000 units capsule Take 2,000 Units by mouth 2 (two) times daily.      Allergies  Allergen Reactions  . Codeine Itching  . Dilaudid [Hydromorphone] Other (See Comments)    Mouth blisters  . Other Itching    Most pain  meds cause itching.  When has to take pain medication, she has been instructed to take Benadryl  . Doxycycline Rash   Follow-up Information    Dettinger, Fransisca Kaufmann, MD. Schedule an appointment as soon as possible for a visit in 10 day(s).   Specialties:  Family Medicine, Cardiology Contact information: [place] [address] [phone]           The results of significant diagnostics from this hospitalization (including imaging, microbiology, ancillary and laboratory) are listed below for reference.    Significant Diagnostic Studies: Ct Abdomen Pelvis W Contrast  Result Date: 02/23/2018 CLINICAL DATA:  Vomiting and diarrhea since Monday. Weakness and slurring of speech. EXAM: CT ABDOMEN AND PELVIS WITH CONTRAST TECHNIQUE: Multidetector CT imaging of the abdomen and pelvis was performed using the standard protocol following bolus administration of intravenous contrast. CONTRAST:  93mL OMNIPAQUE IOHEXOL 300 MG/ML  SOLN COMPARISON:  MRI abdomen 07/19/2013 FINDINGS: Lower chest: Dependent atelectasis in the lung bases. Coronary artery calcifications. Hepatobiliary: Diffuse fatty infiltration of the liver. No focal liver lesions. Gallbladder is surgically absent. Mild extrahepatic bile duct dilatation is within normal range for postoperative patient. Pancreas: Unremarkable. No pancreatic ductal dilatation or surrounding inflammatory changes. Spleen: Normal in size without focal abnormality. Adrenals/Urinary Tract: Adrenal glands are unremarkable. Kidneys are normal, without renal calculi, focal lesion, or hydronephrosis. Bladder is unremarkable. Stomach/Bowel: Stomach, small bowel, and colon are not abnormally distended. Stomach is decompressed. Fluid throughout the small bowel and colon with mild colonic wall thickening. Changes suggest enterocolitis. Diverticulosis of the sigmoid colon without evidence of diverticulitis. Appendix is not identified. Vascular/Lymphatic: Aortic  atherosclerosis. No enlarged abdominal or pelvic lymph nodes. Reproductive: Status post hysterectomy. No adnexal masses. Other: No abdominal wall hernia or abnormality. No abdominopelvic ascites. Musculoskeletal: Degenerative changes in the lumbar spine. Normal alignment. Postoperative posterior fixation of L4-L5. IMPRESSION: Fluid throughout the small bowel and colon with mild colonic wall thickening suggesting enterocolitis. Diffuse fatty infiltration of the liver. Aortic Atherosclerosis (ICD10-I70.0). Electronically Signed   By: Lucienne Capers M.D.   On: 02/23/2018 03:54   Dg Chest Port 1 View  Result Date: 02/23/2018 CLINICAL DATA:  Vomiting, diarrhea and weakness. EXAM: PORTABLE CHEST 1 VIEW COMPARISON:  11/07/2017 FINDINGS: Low lung volumes with mild crowding of interstitial lung markings and central vascular congestion. Heart is top-normal in size. Nonaneurysmal appearing thoracic aorta. Osteoarthritis of the included shoulders. IMPRESSION: Low lung volumes with crowding of interstitial markings and mild pulmonary vascular congestion. Electronically Signed   By: Ashley Royalty M.D.   On: 02/23/2018 00:09    Microbiology: Recent Results (from the past 240 hour(s))  Urine culture     Status: None   Collection Time: 02/23/18 12:08 AM  Result Value Ref Range Status  Specimen Description   Final    URINE, RANDOM Performed at [facility], [address], [place], [place] [address]    Special Requests   Final    NONE Performed at [facility], [address], [place], [place] [address]    Culture   Final    NO GROWTH Performed at [facility], [place] [address], [place], [place] [address]    Report Status 02/25/2018 FINAL  Final  Blood Culture (routine x 2)     Status: None (Preliminary result)   Collection Time: 02/23/18 12:13 AM  Result Value Ref Range Status   Specimen Description BLOOD LEFT HAND  Final   Special Requests   Final    BOTTLES DRAWN AEROBIC AND ANAEROBIC Blood  Culture adequate volume   Culture   Final    NO GROWTH 4 DAYS Performed at [facility], [address] [place], [place] [address]    Report Status PENDING  Incomplete  Blood Culture (routine x 2)     Status: None (Preliminary result)   Collection Time: 02/23/18 12:47 AM  Result Value Ref Range Status   Specimen Description BLOOD LEFT HAND  Final   Special Requests   Final    BOTTLES DRAWN AEROBIC ONLY Blood Culture results may not be optimal due to an inadequate volume of blood received in culture bottles   Culture   Final    NO GROWTH 4 DAYS Performed at [facility], [address], [place], [place] [address]    Report Status PENDING  Incomplete  C difficile quick scan w PCR reflex     Status: Abnormal   Collection Time: 02/23/18  8:51 AM  Result Value Ref Range Status   C Diff antigen (A) NEGATIVE Final    INVALID, POSSIBLE SAMPLE INTEGRITY COMPROMISED, TEST WAS REPEATED   C Diff toxin (A) NEGATIVE Final    INVALID, POSSIBLE SAMPLE INTEGRITY COMPROMISED, TEST WAS REPEATED   C Diff interpretation   Final    INVALID, POSSIBLE SAMPLE INTEGRITY COMPROMISED, TEST WAS REPEATED    Comment: PLEASE REFER TO CDPCR ON ACC [id number] FOR RESULTS. Performed at [facility], [address] [place], [place] [address]   C. Diff by PCR, Reflexed     Status: None   Collection Time: 02/23/18  8:51 AM  Result Value Ref Range Status   Toxigenic C. Difficile by PCR NEGATIVE NEGATIVE Final    Comment: Patient is colonized with non toxigenic C. difficile. May not need treatment unless significant symptoms are present. Performed at [facility], [place] [address] [place], [place] [address]   MRSA PCR Screening     Status: None   Collection Time: 02/23/18  3:37 PM  Result Value Ref Range Status   MRSA by PCR NEGATIVE NEGATIVE Final    Comment:        The GeneXpert MRSA Assay (FDA approved for NASAL specimens only), is one component of a comprehensive MRSA colonization surveillance program. It  is not intended to diagnose MRSA infection nor to guide or monitor treatment for MRSA infections. Performed at [facility], [address] [place], [place] [address]   Gastrointestinal Panel by PCR , Stool     Status: Abnormal   Collection Time: 02/24/18  8:35 PM  Result Value Ref Range Status   Campylobacter species NOT DETECTED NOT DETECTED Final   Plesimonas shigelloides NOT DETECTED NOT DETECTED Final   Salmonella species DETECTED (A) NOT DETECTED Final    Comment: RESULT CALLED  TO, READ BACK BY AND VERIFIED WITH: LESLIE MARTIN @ 1806 ON 02/25/2018 BY [facility]    Yersinia enterocolitica NOT DETECTED NOT DETECTED Final   Vibrio species NOT DETECTED NOT DETECTED Final   Vibrio cholerae NOT DETECTED NOT DETECTED Final   Enteroaggregative E coli (EAEC) NOT DETECTED NOT DETECTED Final   Enteropathogenic E coli (EPEC) NOT DETECTED NOT DETECTED Final   Enterotoxigenic E coli (ETEC) NOT DETECTED NOT DETECTED Final   Shiga like toxin producing E coli (STEC) NOT DETECTED NOT DETECTED Final   Shigella/Enteroinvasive E coli (EIEC) NOT DETECTED NOT DETECTED Final   Cryptosporidium NOT DETECTED NOT DETECTED Final   Cyclospora cayetanensis NOT DETECTED NOT DETECTED Final   Entamoeba histolytica NOT DETECTED NOT DETECTED Final   Giardia lamblia NOT DETECTED NOT DETECTED Final   Adenovirus F40/41 NOT DETECTED NOT DETECTED Final   Astrovirus NOT DETECTED NOT DETECTED Final   Norovirus GI/GII NOT DETECTED NOT DETECTED Final   Rotavirus A NOT DETECTED NOT DETECTED Final   Sapovirus (I, II, IV, and V) NOT DETECTED NOT DETECTED Final    Comment: Performed at [facility], [address] [place], [place] [address]     Labs: Basic Metabolic Panel: Recent Labs  Lab 02/22/18 2338 02/23/18 0456 02/24/18 0412 02/25/18 0900 02/27/18 0531  NA 134* 135 137 140 140  K 3.3* 2.8* 3.3* 3.7 3.2*  CL 101 105 109 113* 111  CO2 20* 21* 20* 21* 21*  GLUCOSE 143* 166* 128* 126* 109*  BUN 25* 24*  18 15 10   CREATININE 1.31* 1.20* 0.89  0.94 0.77 0.70  CALCIUM 8.7* 8.1* 7.9* 8.2* 8.1*  MG  --   --   --  2.1  --    Liver Function Tests: Recent Labs  Lab 02/22/18 2338 02/23/18 0456  AST 26 19  ALT 29 25  ALKPHOS 48 38  BILITOT 1.0 0.7  PROT 8.1 6.6  ALBUMIN 3.8 3.0*   CBC: Recent Labs  Lab 02/22/18 2338 02/25/18 0900 02/27/18 0531  WBC 22.6* 11.0* 10.7*  NEUTROABS 18.9*  --   --   HGB 14.1 13.0 12.7  HCT 44.3 41.3 38.7  MCV 93.5 92.2 91.9  PLT 246 227 241   ProBNP (last 3 results) Recent Labs    01/18/18 1146  PROBNP 68    CBG: Recent Labs  Lab 02/26/18 1146 02/26/18 1645 02/26/18 2141 02/27/18 0743 02/27/18 1129  GLUCAP 126* 108* 134* 103* 126*    Signed:  Barton Dubois MD.  [facility] 02/27/2018, 2:03 PM

## 2018-02-27 NOTE — Care Management Important Message (Signed)
Important Message  Patient Details  Name: Karen Potter MRN: [id number] Date of Birth: April 21, 1941   Medicare Important Message Given:  Yes    [name] 02/27/2018, 1:00 PM

## 2018-02-27 NOTE — Progress Notes (Signed)
Pt's IV catheter removed and intact. Pt's IV site clean dry and intact. Discharge instructions including medications and follow up appointments were reviewed and discussed with patient. All questions were answered and no further questions at this time. Pt in stable condition and in no acute distress at time of discharge. Pt escorted by nurse tech 

## 2018-02-28 LAB — CULTURE, BLOOD (ROUTINE X 2)
Culture: NO GROWTH
Culture: NO GROWTH
Special Requests: ADEQUATE

## 2018-03-02 ENCOUNTER — Other Ambulatory Visit: Payer: Self-pay

## 2018-03-02 ENCOUNTER — Encounter: Payer: Self-pay | Admitting: Family

## 2018-03-02 ENCOUNTER — Ambulatory Visit (INDEPENDENT_AMBULATORY_CARE_PROVIDER_SITE_OTHER): Payer: Medicare Other | Admitting: Family

## 2018-03-02 VITALS — BP 117/70 | HR 73 | Temp 98.1°F | Ht 64.0 in | Wt 252.0 lb

## 2018-03-02 DIAGNOSIS — E860 Dehydration: Secondary | ICD-10-CM

## 2018-03-02 DIAGNOSIS — B9689 Other specified bacterial agents as the cause of diseases classified elsewhere: Secondary | ICD-10-CM

## 2018-03-02 DIAGNOSIS — Z09 Encounter for follow-up examination after completed treatment for conditions other than malignant neoplasm: Secondary | ICD-10-CM

## 2018-03-02 DIAGNOSIS — J208 Acute bronchitis due to other specified organisms: Secondary | ICD-10-CM | POA: Diagnosis not present

## 2018-03-02 DIAGNOSIS — R51 Headache: Secondary | ICD-10-CM

## 2018-03-02 DIAGNOSIS — R05 Cough: Secondary | ICD-10-CM | POA: Diagnosis not present

## 2018-03-02 DIAGNOSIS — R519 Headache, unspecified: Secondary | ICD-10-CM

## 2018-03-02 DIAGNOSIS — R059 Cough, unspecified: Secondary | ICD-10-CM

## 2018-03-02 MED ORDER — AZITHROMYCIN 250 MG PO TABS: ORAL_TABLET | ORAL | 0 refills | Status: DC

## 2018-03-02 NOTE — Patient Instructions (Signed)

## 2018-03-02 NOTE — Progress Notes (Signed)
Subjective:    Patient ID: Karen Potter, female    DOB: 10-30-1940, 77 y.o.   MRN: [id number]  Chief Complaint  Patient presents with  . Headache    Currently on Cipro  . Cough   Pt presents to the office today with headache and cough. She states her headache started Monday after being discharged from the hospital, but only occur when she coughs. She was admitted to the hospital on 10/16 and discharged on 10/21 for sepsis related to salmonella and dehydration. She is currently taking cirpo. She states she still feels weak.   Headache   This is a new problem. The current episode started in the past 7 days. The problem occurs intermittently (when she coughs). The pain is located in the left unilateral region. The pain does not radiate. The pain quality is not similar to prior headaches. The quality of the pain is described as aching. The pain is at a severity of 8/10. The pain is moderate. Associated symptoms include coughing. Pertinent negatives include no back pain, blurred vision, ear pain, eye pain, fever, nausea, phonophobia, photophobia, sore throat or weakness. She has tried NSAIDs for the symptoms. The treatment provided mild relief.  Cough  This is a recurrent problem. The current episode started in the past 7 days. The problem has been gradually improving. The problem occurs every few minutes. Associated symptoms include chills, headaches and shortness of breath. Pertinent negatives include no ear congestion, ear pain, fever, nasal congestion, postnasal drip, sore throat or wheezing. The treatment provided mild relief.      Review of Systems  Constitutional: Positive for chills. Negative for fever.  HENT: Negative for ear pain, postnasal drip and sore throat.   Eyes: Negative for blurred vision, photophobia and pain.  Respiratory: Positive for cough and shortness of breath. Negative for wheezing.   Gastrointestinal: Negative for nausea.  Musculoskeletal: Negative for back pain.    Neurological: Positive for headaches. Negative for weakness.  All other systems reviewed and are negative.      Objective:   Physical Exam  Constitutional: She is oriented to person, place, and time. She appears well-developed and well-nourished. No distress.  HENT:  Head: Normocephalic and atraumatic.  Right Ear: External ear normal.  Left Ear: External ear normal.  Nose: Rhinorrhea present.  Mouth/Throat: Posterior oropharyngeal erythema present.  Eyes: Pupils are equal, round, and reactive to light.  Neck: Normal range of motion. Neck supple. No thyromegaly present.  Cardiovascular: Normal rate, regular rhythm, normal heart sounds and intact distal pulses.  No murmur heard. Pulmonary/Chest: Effort normal. No respiratory distress. She has no wheezes.  Intermittent nonproductive cough   Abdominal: Soft. Bowel sounds are normal. She exhibits no distension. There is no tenderness.  Musculoskeletal: Normal range of motion. She exhibits no edema or tenderness.  Neurological: She is alert and oriented to person, place, and time. She has normal reflexes. No cranial nerve deficit.  Skin: Skin is warm and dry.  Psychiatric: She has a normal mood and affect. Her behavior is normal. Judgment and thought content normal.  Vitals reviewed.   BP 117/70   Pulse 73   Temp 98.1 F (36.7 C) (Oral)   Ht '5\' 4"'  (1.626 m)   Wt 252 lb (114.3 kg)   BMI 43.26 kg/m      Assessment & Plan:  Karen Potter comes in today with chief complaint of Headache (Currently on Cipro) and Cough   Diagnosis and orders addressed:  1. Cough -  CBC with Differential/Platelet - CMP14+EGFR  2. Hospital discharge follow-up - CBC with Differential/Platelet - CMP14+EGFR  3. Dehydration Force fluids - CBC with Differential/Platelet - CMP14+EGFR  4. Acute nonintractable headache, unspecified headache type Tylenol as needed Force fluids - CBC with Differential/Platelet - CMP14+EGFR  5. Acute  bacterial bronchitis - Take meds as prescribed - Use a cool mist humidifier  -Use saline nose sprays frequently -Force fluids -For fever or aces or pains- take tylenol or ibuprofen. -Throat lozenges if help -RTO if symptoms worsen or do not improve  - CBC with Differential/Platelet - CMP14+EGFR - azithromycin (ZITHROMAX) 250 MG tablet; Take 500 mg once, then 250 mg for four days  Dispense: 6 tablet; Refill: 0   Evelina Dun, FNP

## 2018-03-02 NOTE — Patient Outreach (Signed)
[facility]) Care Management  03/02/2018  Karen Potter Feb 05, 1941 [id number]  EMMI: general discharge red alert Referral date: 03/02/18 Referral reason: Know who to call about changes: no Insurance:  [place] health care Day # 1  Telephone call to patient regarding EMMI general discharge red alert. HIPAA verified with patient. Explained reason for call. Patient states she would probably call the hospital if she was having changes in her condition.   RNCM advised patient to call her primary MD for non emergent symptoms and call 911 for more severe symptoms. Patient verbalized understanding. Patient states she is scheduled to follow up with her primary MD on  Monday, 10/ 28/19  Patient reports new symptom of headache since discharge from the hospital. She states the headache is on the left side of her head near the back.  She states the headache is worse with coughing. She reports headache being off and on, pain with coughing and sometimes constant pain.  Patient reports headache is an 8 on the pain scale of 1-10 with 10 being worse. Patient denies taking any medication for pain control. RNCM advised patient to contact her doctors office today to report new symptoms. RNCM advised patient to check her blood pressure and record. Patient verbalized understanding.  Patient reports having transportation to appointments. Patient states she has her medications and takes them as prescribed.  RNCM advised patient to notify MD of any changes in condition prior to scheduled appointment. RNCM provided contact name and number:24 hour nurse advise line [phone].  RNCM verified patient aware of 911 services for urgent/ emergent needs.  PLAN: RNCM will follow up with patient within 1 week.   Quinn Plowman RN,BSN,CCM [facility] Telephonic  [phone]     PLAN:

## 2018-03-03 LAB — CBC WITH DIFFERENTIAL/PLATELET
Basophils Absolute: 0 10*3/uL (ref 0.0–0.2)
Basos: 0 %
EOS (ABSOLUTE): 0.3 10*3/uL (ref 0.0–0.4)
Eos: 2 %
Hematocrit: 38.1 % (ref 34.0–46.6)
Hemoglobin: 13.1 g/dL (ref 11.1–15.9)
Lymphocytes Absolute: 2.4 10*3/uL (ref 0.7–3.1)
Lymphs: 19 %
MCH: 30.2 pg (ref 26.6–33.0)
MCHC: 34.4 g/dL (ref 31.5–35.7)
MCV: 88 fL (ref 79–97)
Monocytes Absolute: 1.1 10*3/uL — ABNORMAL HIGH (ref 0.1–0.9)
Monocytes: 9 %
Neutrophils Absolute: 8.8 10*3/uL — ABNORMAL HIGH (ref 1.4–7.0)
Neutrophils: 69 %
Platelets: 373 10*3/uL (ref 150–450)
RBC: 4.34 x10E6/uL (ref 3.77–5.28)
RDW: 13 % (ref 12.3–15.4)
WBC: 12.7 10*3/uL — ABNORMAL HIGH (ref 3.4–10.8)

## 2018-03-03 LAB — CMP14+EGFR
ALT: 29 IU/L (ref 0–32)
AST: 26 IU/L (ref 0–40)
Albumin/Globulin Ratio: 1.3 (ref 1.2–2.2)
Albumin: 3.8 g/dL (ref 3.5–4.8)
Alkaline Phosphatase: 66 IU/L (ref 39–117)
BUN/Creatinine Ratio: 9 — ABNORMAL LOW (ref 12–28)
BUN: 7 mg/dL — ABNORMAL LOW (ref 8–27)
Bilirubin Total: 0.5 mg/dL (ref 0.0–1.2)
CO2: 27 mmol/L (ref 20–29)
Calcium: 8.9 mg/dL (ref 8.7–10.3)
Chloride: 100 mmol/L (ref 96–106)
Creatinine, Ser: 0.75 mg/dL (ref 0.57–1.00)
GFR calc Af Amer: 90 mL/min/{1.73_m2} (ref >59)
GFR calc non Af Amer: 78 mL/min/{1.73_m2} (ref >59)
Globulin, Total: 3 g/dL (ref 1.5–4.5)
Glucose: 94 mg/dL (ref 65–99)
Potassium: 4 mmol/L (ref 3.5–5.2)
Sodium: 143 mmol/L (ref 134–144)
Total Protein: 6.8 g/dL (ref 6.0–8.5)

## 2018-03-03 LAB — IMMATURE CELLS: MYELOCYTES: 1 % — ABNORMAL HIGH (ref 0–0)

## 2018-03-06 ENCOUNTER — Ambulatory Visit (INDEPENDENT_AMBULATORY_CARE_PROVIDER_SITE_OTHER): Payer: Medicare Other | Admitting: Family Medicine

## 2018-03-06 ENCOUNTER — Encounter: Payer: Self-pay | Admitting: Family Medicine

## 2018-03-06 VITALS — BP 123/73 | HR 72 | Temp 98.3°F | Ht 64.0 in | Wt 248.0 lb

## 2018-03-06 DIAGNOSIS — Z09 Encounter for follow-up examination after completed treatment for conditions other than malignant neoplasm: Secondary | ICD-10-CM | POA: Diagnosis not present

## 2018-03-06 DIAGNOSIS — M5441 Lumbago with sciatica, right side: Secondary | ICD-10-CM

## 2018-03-06 MED ORDER — KETOROLAC TROMETHAMINE 30 MG/ML IJ SOLN
30.0000 mg | Freq: Once | INTRAMUSCULAR | Status: AC
  Administered 2018-03-06: 30 mg via INTRAMUSCULAR

## 2018-03-06 MED ORDER — METHYLPREDNISOLONE ACETATE 40 MG/ML IJ SUSP
40.0000 mg | Freq: Once | INTRAMUSCULAR | Status: AC
  Administered 2018-03-06: 40 mg via INTRAMUSCULAR

## 2018-03-06 NOTE — Progress Notes (Signed)
Patient referred by Dr. Warrick Parisian, seen by me on 01/10/18, diagnostic PSG on 01/16/18. Patient had a CPAP titration study on 02/20/18.  Please call and inform patient that I have entered an order for treatment with positive airway pressure (PAP) treatment for obstructive sleep apnea (OSA). She did fairly well during the latest sleep study with CPAP. We will, therefore, arrange for a new machine for home use through a DME (durable medical equipment) company of Her choice; and I will see the patient back in follow-up in about 10 weeks. Please also explain to the patient that I will be looking out for compliance data, which can be downloaded from the machine (stored on an SD card, that is inserted in the machine) or via remote access through a modem, that is built into the machine. At the time of the followup appointment we will discuss sleep study results and how it is going with PAP treatment at home. Please advise patient to bring Her machine at the time of the first FU visit, even though this is cumbersome. Bringing the machine for every visit after that will likely not be needed, but often helps for the first visit to troubleshoot if needed. Please re-enforce the importance of compliance with treatment and the need for [place] to monitor compliance data - often an insurance requirement and actually good feedback for the patient as far as how they are doing.  Also remind patient, that any interim PAP machine or mask issues should be first addressed with the DME company, as they can often help better with technical and mask fit issues. Please ask if patient has a preference regarding DME company.  Please also make sure, the patient has a follow-up appointment with me in about 10 weeks from the setup date, thanks. May see one of our nurse practitioners if needed for proper timing of the FU appointment.  Please fax or rout report to the referring provider. Thanks,   [name], MD, PhD [facility]  [facility])

## 2018-03-06 NOTE — Progress Notes (Signed)
Subjective:    Patient ID: Karen Potter, female    DOB: 1940-09-03, 77 y.o.   MRN: [id number]  Chief Complaint:  Hospitalization Follow-up   HPI: Karen Potter is a 77 y.o. female presenting on 03/06/2018 for Hospitalization Follow-up  Today's visit is for Transitional Care Management.  The patient was discharged from [facility] on 02/22/2018 with a primary diagnosis of sepsis due to salmonella.   Contact with the patient and/or caregiver, by a clinical staff member, was made on 03/02/2018 and was documented as a telephone encounter within the EMR.  Through chart review and discussion with the patient I have determined that management of their condition is of moderate complexity.    Pt reports she has been doing fairly well overall, states she still has some fatigue. States she was seen on 10/24/1   Relevant past medical, surgical, family, and social history reviewed and updated as indicated.  Allergies and medications reviewed and updated.   Past Medical History:  Diagnosis Date  . Duodenal ulcer without hemorrhage or perforation    08-29-2015 per EGD reort  . Fatty liver   . Gastric ulcer without hemorrhage or perforation    08-29-2015 per EGD report  . Gastritis    per EGD 08-29-2015  . GERD (gastroesophageal reflux disease)   . Hearing loss    no hearing aids  . 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 History of kidney stones   . History of TIA (transient ischemic attack)    03-23-2014  w/ episode temporay amnesia  . Hyperlipidemia   . Hypertension   . Hypothyroidism   . Iron deficiency anemia   . LAFB (left anterior fascicular block)   . OA (osteoarthritis)   . OSA on CPAP    severe per study 2003  . 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 Post-polio limb  muscle weakness    left leg  w/ 3 repair surgery's  . RBBB (right bundle branch block)   . Seasonal allergies   . Type 2 diabetes mellitus ([place])   . Vitamin D deficiency     Past Surgical History:  Procedure Laterality Date  . CARPAL TUNNEL RELEASE Right 05-10-2007  . CHOLECYSTECTOMY OPEN  1985   and Appendectomy  . CYSTO/  RIGHT URETEROSCOPIC STONE EXTRACTON/  STENT PLACEMENT  06/ 2016   in [place]  . CYSTOSCOPY/RETROGRADE/URETEROSCOPY/STONE EXTRACTION WITH BASKET Right 11/17/2015   Procedure: CYSTOSCOPY/RETROGRADE PYELOGRAM RIGHT/DIAGNOSTIC RIGHT URETEROSCOPY/LASER RENAL CALCIFICATION/RIGHT STENT PLACEMENT;  Surgeon: Cleon Gustin, MD;  Location: [facility];  Service: Urology;  Laterality: Right;  . ESOPHAGOGASTRODUODENOSCOPY N/A 08/29/2015   Procedure: ESOPHAGOGASTRODUODENOSCOPY (EGD);  Surgeon: Danie Binder, MD;  Location: [facility];  Service: Endoscopy;  Laterality: N/A;  . HOLMIUM LASER APPLICATION Right 0/35/5974   Procedure: HOLMIUM LASER APPLICATION;  Surgeon: Cleon Gustin, MD;  Location: [facility];  Service: Urology;  Laterality: Right;  . KNEE ARTHROSCOPY Right 1999  . LEFT HEEL REPAIR SURGERY  1954;  1985;  1995   polio  . LUMBAR SPINE SURGERY  09-01-2010   L4 -L5 fusion,  L3 laminectomy,  L5 - S1 foraminotomy  . OVARIAN CYST SURGERY Right 1966  .  PARATHYROIDECTOMY N/A 05/06/2015   Procedure: PARATHYROIDECTOMY;  Surgeon: Armandina Gemma, MD;  Location: [facility];  Service: General;  Laterality: N/A;   left superior  . TONSILLECTOMY  as child  . TOTAL ABDOMINAL HYSTERECTOMY W/ BILATERAL SALPINGOOPHORECTOMY  1984  . TOTAL KNEE ARTHROPLASTY Right 04-08-2008  . TRANSTHORACIC ECHOCARDIOGRAM  04-07-2012   grade 1 diastolic function,  ef 62-56%/  mild AV calcification without stenosis/  trivial MR    Social History   Socioeconomic History  . Marital status: Married    Spouse name: Not on file  . Number of children: Not on file  .  Years of education: Not on file  . Highest education level: Not on file  Occupational History  . Not on file  Social Needs  . Financial resource strain: Not on file  . Food insecurity:    Worry: Not on file    Inability: Not on file  . Transportation needs:    Medical: Not on file    Non-medical: Not on file  Tobacco Use  . Smoking status: Never Smoker  . Smokeless tobacco: Never Used  Substance and Sexual Activity  . Alcohol use: No  . Drug use: No  . Sexual activity: Not on file  Lifestyle  . Physical activity:    Days per week: Not on file    Minutes per session: Not on file  . Stress: Not on file  Relationships  . Social connections:    Talks on phone: Not on file    Gets together: Not on file    Attends religious service: Not on file    Active member of club or organization: Not on file    Attends meetings of clubs or organizations: Not on file    Relationship status: Not on file  . Intimate partner violence:    Fear of current or ex partner: Not on file    Emotionally abused: Not on file    Physically abused: Not on file    Forced sexual activity: Not on file  Other Topics Concern  . Not on file  Social History Narrative   Lives with husband.  Four children.      Outpatient Encounter Medications as of 03/06/2018  Medication Sig  . aspirin 325 MG tablet Take 325 mg by mouth every evening.   Marland Kitchen azithromycin (ZITHROMAX) 250 MG tablet Take 500 mg once, then 250 mg for four days  . calcium carbonate (CALCIUM 600) 600 MG TABS tablet Take 600 mg by mouth 2 (two) times daily with a meal.  . carvedilol (COREG) 6.25 MG tablet TAKE 1 TABLET BY MOUTH TWICE DAILY WITH MEALS  . cetirizine (ZYRTEC) 10 MG tablet Take 1 tablet (10 mg total) by mouth daily.  . Cholecalciferol (VITAMIN D3) 2000 UNITS capsule Take 2,000 Units by mouth 2 (two) times daily.   . clobetasol ointment (TEMOVATE) 3.89 % Apply 1 application topically as needed.  . clotrimazole-betamethasone (LOTRISONE)  cream Apply 1 application topically as needed.  . fluticasone (FLONASE) 50 MCG/ACT nasal spray Place 2 sprays into both nostrils daily.  . furosemide (LASIX) 40 MG tablet Take 1 tablet (40 mg total) by mouth daily.  Marland Kitchen glimepiride (AMARYL) 2 MG tablet Take 2 mg by mouth daily with breakfast.  . hydrocortisone (ANUSOL-HC) 2.5 % rectal cream Place 1 application rectally 2 (two) times daily.  Marland Kitchen levothyroxine (SYNTHROID, LEVOTHROID) 150 MCG tablet TAKE 1 TABLET BY MOUTH ONCE DAILY  . losartan (COZAAR) 25 MG tablet Take 1 tablet (25 mg  total) by mouth daily.  . Magnesium 250 MG TABS Take 250 mg by mouth 2 (two) times daily.  . Omega-3 Fatty Acids (FISH OIL TRIPLE STRENGTH) 1400 MG CAPS Take 1,400 mg by mouth 4 (four) times daily.   Marland Kitchen omeprazole (PRILOSEC) 20 MG capsule Take 1 capsule (20 mg total) by mouth 2 (two) times daily before a meal.  . ondansetron (ZOFRAN ODT) 4 MG disintegrating tablet Take 1 tablet (4 mg total) by mouth every 8 (eight) hours as needed for nausea or vomiting.  . saccharomyces boulardii (FLORASTOR) 250 MG capsule Take 1 capsule (250 mg total) by mouth 2 (two) times daily.   Facility-Administered Encounter Medications as of 03/06/2018  Medication  . ketorolac (TORADOL) 30 MG/ML injection 30 mg  . methylPREDNISolone acetate (DEPO-MEDROL) injection 40 mg    Allergies  Allergen Reactions  . Codeine Itching  . Dilaudid [Hydromorphone] Other (See Comments)    Mouth blisters  . Other Itching    Most pain meds cause itching.  When has to take pain medication, she has been instructed to take Benadryl  . Doxycycline Rash    Review of Systems  Constitutional: Positive for fatigue. Negative for chills and fever.  Respiratory: Negative for cough, chest tightness and shortness of breath.   Cardiovascular: Negative for chest pain and palpitations.  Gastrointestinal: Negative for abdominal pain, blood in stool, constipation, diarrhea, nausea and vomiting.  Endocrine: Negative for  cold intolerance, heat intolerance, polydipsia, polyphagia and polyuria.  Genitourinary: Negative for decreased urine volume and difficulty urinating.  Musculoskeletal: Positive for arthralgias (right hip and right lower back) and back pain (right lower back).  Neurological: Positive for weakness (generalized). Negative for dizziness and seizures.  Psychiatric/Behavioral: Negative for confusion.  All other systems reviewed and are negative.       Objective:    BP 123/73   Pulse 72   Temp 98.3 F (36.8 C) (Oral)   Ht '5\' 4"'$  (1.626 m)   Wt 248 lb (112.5 kg)   BMI 42.57 kg/m    Wt Readings from Last 3 Encounters:  03/06/18 248 lb (112.5 kg)  03/02/18 252 lb (114.3 kg)  02/27/18 259 lb 4.2 oz (117.6 kg)    Physical Exam  Constitutional: She is oriented to person, place, and time. She appears well-developed and well-nourished. She is cooperative. No distress.  HENT:  Head: Normocephalic.  Eyes: Conjunctivae and lids are normal.  Neck: Trachea normal and phonation normal.  Cardiovascular: Normal rate, regular rhythm and normal heart sounds. Exam reveals no gallop and no friction rub.  No murmur heard. Pulmonary/Chest: Effort normal and breath sounds normal. No respiratory distress.  Abdominal: Soft. Normal appearance and bowel sounds are normal.  Musculoskeletal:       Lumbar back: She exhibits decreased range of motion, tenderness and pain. She exhibits no bony tenderness, no swelling, no edema, no deformity, no laceration, no spasm and normal pulse.       Back:  Positive straight leg raise test - right   Neurological: She is alert and oriented to person, place, and time. She has normal strength and normal reflexes. No sensory deficit.  Skin: Skin is warm and dry. Capillary refill takes less than 2 seconds.  Psychiatric: She has a normal mood and affect. Her speech is normal and behavior is normal. Judgment and thought content normal. Cognition and memory are normal.  Nursing  note and vitals reviewed.   Results for orders placed or performed in visit on 03/02/18  CBC with  Differential/Platelet  Result Value Ref Range   WBC 12.7 (H) 3.4 - 10.8 x10E3/uL   RBC 4.34 3.77 - 5.28 x10E6/uL   Hemoglobin 13.1 11.1 - 15.9 g/dL   Hematocrit 38.1 34.0 - 46.6 %   MCV 88 79 - 97 fL   MCH 30.2 26.6 - 33.0 pg   MCHC 34.4 31.5 - 35.7 g/dL   RDW 13.0 12.3 - 15.4 %   Platelets 373 150 - 450 x10E3/uL   Neutrophils 69 Not Estab. %   Lymphs 19 Not Estab. %   Monocytes 9 Not Estab. %   Eos 2 Not Estab. %   Basos 0 Not Estab. %   Immature Cells Note    Neutrophils Absolute 8.8 (H) 1.4 - 7.0 x10E3/uL   Lymphocytes Absolute 2.4 0.7 - 3.1 x10E3/uL   Monocytes Absolute 1.1 (H) 0.1 - 0.9 x10E3/uL   EOS (ABSOLUTE) 0.3 0.0 - 0.4 x10E3/uL   Basophils Absolute 0.0 0.0 - 0.2 x10E3/uL   Hematology Comments: Note:   CMP14+EGFR  Result Value Ref Range   Glucose 94 65 - 99 mg/dL   BUN 7 (L) 8 - 27 mg/dL   Creatinine, Ser 0.75 0.57 - 1.00 mg/dL   GFR calc non Af Amer 78 >59 mL/min/1.73   GFR calc Af Amer 90 >59 mL/min/1.73   BUN/Creatinine Ratio 9 (L) 12 - 28   Sodium 143 134 - 144 mmol/L   Potassium 4.0 3.5 - 5.2 mmol/L   Chloride 100 96 - 106 mmol/L   CO2 27 20 - 29 mmol/L   Calcium 8.9 8.7 - 10.3 mg/dL   Total Protein 6.8 6.0 - 8.5 g/dL   Albumin 3.8 3.5 - 4.8 g/dL   Globulin, Total 3.0 1.5 - 4.5 g/dL   Albumin/Globulin Ratio 1.3 1.2 - 2.2   Bilirubin Total 0.5 0.0 - 1.2 mg/dL   Alkaline Phosphatase 66 39 - 117 IU/L   AST 26 0 - 40 IU/L   ALT 29 0 - 32 IU/L  Immature Cells  Result Value Ref Range   MYELOCYTES 1 (H) 0 - 0 %       Pertinent labs & imaging results that were available during my care of the patient were reviewed by me and considered in my medical decision making.  Assessment & Plan:  Karen Potter was seen today for hospitalization follow-up.  Diagnoses and all orders for this visit:  Hospital discharge follow-up Labs completed on 03/02/18, WBC noted to be  slightly elevated. Pt placed on a z-pack during this visit for acute bacterial bronchitis. Will have pt to repeat WBC in 2-4 weeks. Pt aware to return for concerning or worsening symptoms.   Acute right-sided low back pain with right-sided sciatica Moist heat. Strengthening exercises. Tylenol as needed for pain.  -     methylPREDNISolone acetate (DEPO-MEDROL) injection 40 mg -     ketorolac (TORADOL) 30 MG/ML injection 30 mg    Continue all other maintenance medications.  Follow up plan: Return in about 4 weeks (around 04/03/2018), or if symptoms worsen or fail to improve.  Educational handout given for sciatica  The above assessment and management plan was discussed with the patient. The patient verbalized understanding of and has agreed to the management plan. Patient is aware to call the clinic if symptoms persist or worsen. Patient is aware when to return to the clinic for a follow-up visit. Patient educated on when it is appropriate to go to the emergency department.   Monia Pouch, FNP-C [facility] [phone]

## 2018-03-06 NOTE — Procedures (Signed)
PATIENT'S NAME:  Karen Potter, Karen Potter DOB:      1941-02-18      MR#:    [id number]     DATE OF RECORDING: 02/20/2018 REFERRING M.D.:  Caryl Pina, MD Study Performed:   CPAP  Titration HISTORY: 77 year old woman with a history of type 2 diabetes, right bundle branch block, history of polio, osteoarthritis, hypothyroidism, hypertension, hyperlipidemia, history of hepatitis B, history of TIA, reflux disease, seasonal allergies, peripheral edema, type 2 diabetes, and morbid obesity with BMI of over 40, who  returns for a CPAP Titration sleep study. Her baseline PSG on 01/16/18 showed an AHI of 30.3/h, a supine AHI of 32.9/h, absence of REM sleep, and an oxygen nadir of 86%. The patient endorsed the Epworth Sleepiness Scale at 15/24 points. BMI of 44. kg/m2.  CURRENT MEDICATIONS: Asipirn, Calcium carbonate, Coreg, Zyrtec, Vitamin D3, Temovate, Lotrisone, Lasix, Amaryl, Synthroid, Hyzaar, Magnesium, Omega-3, Prilosec, Proventil,.   PROCEDURE:  This is a multichannel digital polysomnogram utilizing the SomnoStar 11.2 system.  Electrodes and sensors were applied and monitored per AASM Specifications.   EEG, EOG, Chin and Limb EMG, were sampled at 200 Hz.  ECG, Snore and Nasal Pressure, Thermal Airflow, Respiratory Effort, CPAP Flow and Pressure, Oximetry was sampled at 50 Hz. Digital video and audio were recorded.      The patient was fitted with a small Simplus FFM, as she is used to using a full face mask. CPAP was initiated at 5 cmH20 with heated humidity per AASM standards and pressure was advanced to 10 cmH20 because of hypopneas, apneas and desaturations.  At a PAP pressure of 10 cmH20, there was a reduction of the AHI to 2.6 with supine NREM sleep achieved and O2 nadir of 87%.   Lights Out was at 22:05 and Lights On at 05:18. Total recording time (TRT) was 434 minutes, with a total sleep time (TST) of 233 minutes. The patient's sleep latency was 133 minutes, which is delayed. REM latency was 138.5  minutes, which is delayed. The sleep efficiency was 53.7%.    SLEEP ARCHITECTURE: WASO (Wake after sleep onset) was 146 minutes with moderate to severe sleep fragmentation noted.  There were 16 minutes in Stage N1, 131.5 minutes Stage N2, 75 minutes Stage N3 and 10.5 minutes in Stage REM.  The percentage of Stage N1 was 6.9%, Stage N2 was 56.4%, Stage N3 was 32.2% and Stage R (REM sleep) was 4.5%, which is markedly reduced. The arousals were noted as: 31 were spontaneous, 12 were associated with PLMs, 13 were associated with respiratory events.  RESPIRATORY ANALYSIS:  There was a total of 24 respiratory events: 0 obstructive apneas, 0 central apneas and 0 mixed apneas with a total of 0 apneas and an apnea index (AI) of 0 /hour. There were 24 hypopneas with a hypopnea index of 6.2/hour. The patient also had 0 respiratory event related arousals (RERAs).      The total APNEA/HYPOPNEA INDEX  (AHI) was 6.2 /hour and the total RESPIRATORY DISTURBANCE INDEX was 6.2 /hour  3 events occurred in REM sleep and 21 events in NREM. The REM AHI was 17.1 /hour versus a non-REM AHI of 5.7 /hour.  The patient spent 122.5 minutes of total sleep time in the supine position and 111 minutes in non-supine. The supine AHI was 9.3, versus a non-supine AHI of 2.7.  OXYGEN SATURATION & C02:  The baseline 02 saturation was 92%, with the lowest being 86%. Time spent below 89% saturation equaled 10 minutes.  PERIODIC LIMB  MOVEMENTS:  The patient had a total of 20 Periodic Limb Movements. The Periodic Limb Movement (PLM) index was 5.2 and the PLM Arousal index was 3.1 /hour.  Audio and video analysis did not show any abnormal or unusual movements, behaviors, phonations or vocalizations. The patient took 1 bathroom break. The EKG was in keeping with normal sinus rhythm (NSR).  Post-study, the patient indicated that sleep was worse than usual.   IMPRESSION:  1. Obstructive Sleep Apnea (OSA) 2. Dysfunctions associated with sleep  stages or arousals from sleep   RECOMMENDATIONS: 1. This study demonstrates near-resolution of the patient's obstructive sleep apnea with CPAP therapy. Given the O2 nadir of 87% on the final titration pressure and only NREM sleep achieved at that pressure, I will recommend a home CPAP treatment at a pressure of 12 cm via FFM with heated humidity. The patient should be reminded to be fully compliant with PAP therapy to improve sleep related symptoms and decrease long term cardiovascular risks. The patient should be reminded, that it may take up to 3 months to get fully used to using PAP with all planned sleep. The earlier full compliance is achieved, the better long term compliance tends to be. Please note that untreated obstructive sleep apnea carries additional perioperative morbidity. Patients with significant obstructive sleep apnea should receive perioperative PAP therapy and the surgeons and particularly the anesthesiologist should be informed of the diagnosis and the severity of the sleep disordered breathing. 2. This study shows significant sleep fragmentation and abnormal sleep stage percentages; these are nonspecific findings and per se do not signify an intrinsic sleep disorder or a cause for the patient's sleep-related symptoms. Causes include (but are not limited to) the first night effect of the sleep study, circadian rhythm disturbances, medication effect or an underlying mood disorder or medical problem. 3. The patient should be cautioned not to drive, work at heights, or operate dangerous or heavy equipment when tired or sleepy. Review and reiteration of good sleep hygiene measures should be pursued with any patient. 4. The patient will be seen in follow-up in the sleep clinic at [facility] for discussion of the test results, symptom and treatment compliance review, further management strategies, etc. The referring provider will be notified of the test results.   I certify that I have reviewed the  entire raw data recording prior to the issuance of this report in accordance with the Standards of Accreditation of the American Academy of Sleep Medicine (AASM)   [name], MD, PhD Diplomat, American Board of Neurology and Sleep Medicine (Neurology and Sleep Medicine)

## 2018-03-06 NOTE — Patient Instructions (Signed)
Sciatica Sciatica is pain, numbness, weakness, or tingling along your sciatic nerve. The sciatic nerve starts in the lower back and goes down the back of each leg. Sciatica happens when this nerve is pinched or has pressure put on it. Sciatica usually goes away on its own or with treatment. Sometimes, sciatica may keep coming back (recur). Follow these instructions at home: Medicines  Take over-the-counter and prescription medicines only as told by your doctor.  Do not drive or use heavy machinery while taking prescription pain medicine. Managing pain  If directed, put ice on the affected area. ? Put ice in a plastic bag. ? Place a towel between your skin and the bag. ? Leave the ice on for 20 minutes, 2-3 times a day.  After icing, apply heat to the affected area before you exercise or as often as told by your doctor. Use the heat source that your doctor tells you to use, such as a moist heat pack or a heating pad. ? Place a towel between your skin and the heat source. ? Leave the heat on for 20-30 minutes. ? Remove the heat if your skin turns bright red. This is especially important if you are unable to feel pain, heat, or cold. You may have a greater risk of getting burned. Activity  Return to your normal activities as told by your doctor. Ask your doctor what activities are safe for you. ? Avoid activities that make your sciatica worse.  Take short rests during the day. Rest in a lying or standing position. This is usually better than sitting to rest. ? When you rest for a long time, do some physical activity or stretching between periods of rest. ? Avoid sitting for a long time without moving. Get up and move around at least one time each hour.  Exercise and stretch regularly, as told by your doctor.  Do not lift anything that is heavier than 10 lb (4.5 kg) while you have symptoms of sciatica. ? Avoid lifting heavy things even when you do not have symptoms. ? Avoid lifting heavy  things over and over.  When you lift objects, always lift in a way that is safe for your body. To do this, you should: ? Bend your knees. ? Keep the object close to your body. ? Avoid twisting. General instructions  Use good posture. ? Avoid leaning forward when you are sitting. ? Avoid hunching over when you are standing.  Stay at a healthy weight.  Wear comfortable shoes that support your feet. Avoid wearing high heels.  Avoid sleeping on a mattress that is too soft or too hard. You might have less pain if you sleep on a mattress that is firm enough to support your back.  Keep all follow-up visits as told by your doctor. This is important. Contact a doctor if:  You have pain that: ? Wakes you up when you are sleeping. ? Gets worse when you lie down. ? Is worse than the pain you have had in the past. ? Lasts longer than 4 weeks.  You lose weight for without trying. Get help right away if:  You cannot control when you pee (urinate) or poop (have a bowel movement).  You have weakness in any of these areas and it gets worse. ? Lower back. ? Lower belly (pelvis). ? Butt (buttocks). ? Legs.  You have redness or swelling of your back.  You have a burning feeling when you pee. This information is not intended to replace   advice given to you by your health care provider. Make sure you discuss any questions you have with your health care provider. Document Released: 02/03/2008 Document Revised: 10/02/2015 Document Reviewed: 01/03/2015 Elsevier Interactive Patient Education  2018 Elsevier Inc.  

## 2018-03-06 NOTE — Addendum Note (Signed)
Addended by: [name] on: 03/06/2018 04:51 PM   Modules accepted: Orders

## 2018-03-07 ENCOUNTER — Other Ambulatory Visit: Payer: Self-pay | Admitting: Family Medicine

## 2018-03-07 ENCOUNTER — Other Ambulatory Visit: Payer: Self-pay

## 2018-03-07 ENCOUNTER — Telehealth: Payer: Self-pay | Admitting: Neurology

## 2018-03-07 NOTE — Telephone Encounter (Signed)
I called pt back. I advised pt that Dr. Rexene Alberts reviewed their sleep study results and found that pt was able to tolerate CPAP at pressure of 10-12 cm. Dr. Rexene Alberts recommends that pt starts CPAP at pressure of 12 cm water pressure. I reviewed PAP compliance expectations with the pt. Pt is agreeable to starting a CPAP. I advised pt that an order will be sent to a DME, [facility], and Huey Romans will call the pt within about one week after they file with the pt's insurance. [facility] will show the pt how to use the machine, fit for masks, and troubleshoot the CPAP if needed. A follow up appt was made for insurance purposes with Dr. Rexene Alberts on Jan 22,2020 at 2:00 pm. Pt verbalized understanding to arrive 15 minutes early and bring their CPAP. A letter with all of this information in it will be mailed to the pt as a reminder. I verified with the pt that the address we have on file is correct. Pt verbalized understanding of results. Pt had no questions at this time but was encouraged to call back if questions arise. I have sent the order to [facility] and have received confirmation that they have received the order.

## 2018-03-07 NOTE — Telephone Encounter (Signed)
Pt has returned the call to RN Casey, she is asking for a call back °

## 2018-03-07 NOTE — Patient Outreach (Signed)
[facility]) Care Management  03/07/2018  Karen Potter [birth year] [id number]   EMMI: general discharge follow up  Referral date: 03/02/18 Referral reason: Know who to call about changes: no Insurance:  [place] health care  Telephone call to patient regarding EMMI general discharge follow up. HIPAA verified. Patient states she saw her primary MD on yesterday 03/06/18. Patient reports her headaches are gone. She states she is now having sciatic pain.  Patient states her doctor gave her an pain medication injection and a steroid infection. Patient denies sciatic pain at this time. Patient reports her pain is a 5 on a scale of 1-10 when she is laying down.   Patient states she has a follow up with her primary MD  In 4 weeks. Patient denies any further needs or concerns at this time.   PLAN; RNCM will close patient due to patient being assessed and having no further needs.  RNCM will send closure notification to patients primary MD.   Quinn Plowman RN,BSN,CCM [facility] Telephonic  [phone]

## 2018-03-07 NOTE — Telephone Encounter (Signed)
Called patient to discuss sleep study results. No answer at this time. LVM for the patient to call back.   

## 2018-03-07 NOTE — Telephone Encounter (Signed)
-----   Message from [name], MD sent at 03/06/2018  4:50 PM EDT ----- Patient referred by Dr. Warrick Parisian, seen by me on 01/10/18, diagnostic PSG on 01/16/18. Patient had a CPAP titration study on 02/20/18.  Please call and inform patient that I have entered an order for treatment with positive airway pressure (PAP) treatment for obstructive sleep apnea (OSA). She did fairly well during the latest sleep study with CPAP. We will, therefore, arrange for a new machine for home use through a DME (durable medical equipment) company of Her choice; and I will see the patient back in follow-up in about 10 weeks. Please also explain to the patient that I will be looking out for compliance data, which can be downloaded from the machine (stored on an SD card, that is inserted in the machine) or via remote access through a modem, that is built into the machine. At the time of the followup appointment we will discuss sleep study results and how it is going with PAP treatment at home. Please advise patient to bring Her machine at the time of the first FU visit, even though this is cumbersome. Bringing the machine for every visit after that will likely not be needed, but often helps for the first visit to troubleshoot if needed. Please re-enforce the importance of compliance with treatment and the need for [place] to monitor compliance data - often an insurance requirement and actually good feedback for the patient as far as how they are doing.  Also remind patient, that any interim PAP machine or mask issues should be first addressed with the DME company, as they can often help better with technical and mask fit issues. Please ask if patient has a preference regarding DME company.  Please also make sure, the patient has a follow-up appointment with me in about 10 weeks from the setup date, thanks. May see one of our nurse practitioners if needed for proper timing of the FU appointment.  Please fax or rout report to the referring  provider. Thanks,   [name], MD, PhD [facility])

## 2018-03-10 ENCOUNTER — Ambulatory Visit (INDEPENDENT_AMBULATORY_CARE_PROVIDER_SITE_OTHER): Payer: Medicare Other | Admitting: *Deleted

## 2018-03-10 DIAGNOSIS — Z23 Encounter for immunization: Secondary | ICD-10-CM

## 2018-03-31 ENCOUNTER — Other Ambulatory Visit: Payer: Self-pay | Admitting: *Deleted

## 2018-03-31 ENCOUNTER — Ambulatory Visit (INDEPENDENT_AMBULATORY_CARE_PROVIDER_SITE_OTHER): Payer: Medicare Other | Admitting: Family Medicine

## 2018-03-31 ENCOUNTER — Encounter: Payer: Self-pay | Admitting: Family Medicine

## 2018-03-31 ENCOUNTER — Ambulatory Visit (INDEPENDENT_AMBULATORY_CARE_PROVIDER_SITE_OTHER): Payer: Medicare Other

## 2018-03-31 VITALS — BP 125/77 | HR 72 | Temp 98.1°F | Ht 64.0 in | Wt 246.0 lb

## 2018-03-31 DIAGNOSIS — M25551 Pain in right hip: Secondary | ICD-10-CM

## 2018-03-31 DIAGNOSIS — Z9889 Other specified postprocedural states: Secondary | ICD-10-CM

## 2018-03-31 DIAGNOSIS — A419 Sepsis, unspecified organism: Secondary | ICD-10-CM | POA: Diagnosis not present

## 2018-03-31 DIAGNOSIS — R52 Pain, unspecified: Secondary | ICD-10-CM

## 2018-03-31 MED ORDER — TIZANIDINE HCL 4 MG PO TABS: 2.0000 mg | ORAL_TABLET | Freq: Three times a day (TID) | ORAL | 0 refills | Status: DC | PRN

## 2018-03-31 MED ORDER — PREDNISONE 10 MG (21) PO TBPK: ORAL_TABLET | ORAL | 0 refills | Status: DC

## 2018-03-31 NOTE — Progress Notes (Signed)
hip

## 2018-03-31 NOTE — Patient Instructions (Signed)
I have sent a prednisone dosepak to your pharmacy.  This is a steroidal antiinflammatory.  I want you to hold the Aleve for now while you are on the prednisone Dosepak.  Take this as directed.  I have also sent in a muscle relaxer to use as needed as directed.  The muscle relaxer may cause sedation to be aware of this.  I have sent in a referral to the orthopedist.  I am concerned that your right hip pain may indeed actually be coming from your back.   Hip Pain The hip is the joint between the upper legs and the lower pelvis. The bones, cartilage, tendons, and muscles of your hip joint support your body and allow you to move around. Hip pain can range from a minor ache to severe pain in one or both of your hips. The pain may be felt on the inside of the hip joint near the groin, or the outside near the buttocks and upper thigh. You may also have swelling or stiffness. Follow these instructions at home: Managing pain, stiffness, and swelling  If directed, apply ice to the injured area. ? Put ice in a plastic bag. ? Place a towel between your skin and the bag. ? Leave the ice on for 20 minutes, 2-3 times a day  Sleep with a pillow between your legs on your most comfortable side.  Avoid any activities that cause pain. General instructions  Take over-the-counter and prescription medicines only as told by your health care provider.  Do any exercises as told by your health care provider.  Record the following: ? How often you have hip pain. ? The location of your pain. ? What the pain feels like. ? What makes the pain worse.  Keep all follow-up visits as told by your health care provider. This is important. Contact a health care provider if:  You cannot put weight on your leg.  Your pain or swelling continues or gets worse after one week.  It gets harder to walk.  You have a fever. Get help right away if:  You fall.  You have a sudden increase in pain and swelling in your  hip.  Your hip is red or swollen or very tender to touch. Summary  Hip pain can range from a minor ache to severe pain in one or both of your hips.  The pain may be felt on the inside of the hip joint near the groin, or the outside near the buttocks and upper thigh.  Avoid any activities that cause pain.  Record how often you have hip pain, the location of the pain, what makes it worse and what it feels like. This information is not intended to replace advice given to you by your health care provider. Make sure you discuss any questions you have with your health care provider. Document Released: 10/14/2009 Document Revised: 03/29/2016 Document Reviewed: 03/29/2016 Elsevier Interactive Patient Education  [employer].

## 2018-03-31 NOTE — Progress Notes (Signed)
Subjective: CC: Back pain PCP: Dettinger, Fransisca Kaufmann, MD KJZ:PHXTAV P Candler is a 77 y.o. female presenting to clinic today for:  1.  Hip pain Patient reports onset of right hip pain over the last several days.  She notes that she was actually seen a couple of weeks ago for right-sided back pain.  She was treated with 2 injections during that visit, which she reports did not help for at least a week following the appointment.  She reports that the pain is worse with standing.  She points to the anterior hip and notes some tightness in the right gluteus and low back.  Denies any radiation down the lower extremity.  Denies any falls, weakness or sensation changes.  No saddle anesthesia, fecal incontinence or urinary retention.  She has been using Aleve back and muscle as well as salonpas with little improvement in symptoms.  Of note, she does have history of gastric ulcer and is on omeprazole for this.  Past surgical history is significant for back surgery many years ago performed in [place].  She is not currently established with an orthopedist (was previously seen by Dr Maureen Ralphs for knee) or neurosurgeon.  ROS: Per HPI  Allergies  Allergen Reactions  . Codeine Itching  . Dilaudid [Hydromorphone] Other (See Comments)    Mouth blisters  . Other Itching    Most pain meds cause itching.  When has to take pain medication, she has been instructed to take Benadryl  . Doxycycline Rash   Past Medical History:  Diagnosis Date  . Duodenal ulcer without hemorrhage or perforation    08-29-2015 per EGD reort  . Fatty liver   . Gastric ulcer without hemorrhage or perforation    08-29-2015 per EGD report  . Gastritis    per EGD 08-29-2015  . GERD (gastroesophageal reflux disease)   . Hearing loss    no hearing aids  . 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 History of kidney stones   . History of TIA (transient ischemic attack)    03-23-2014  w/ episode temporay amnesia  . Hyperlipidemia   . Hypertension   . Hypothyroidism   . Iron deficiency anemia   . LAFB (left anterior fascicular block)   . OA (osteoarthritis)   . OSA on CPAP    severe per study 2003  . 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 RBBB (right bundle branch block)   . Seasonal allergies   . Type 2 diabetes mellitus ([employer])   . Vitamin D deficiency     Current Outpatient Medications:  .  aspirin 325 MG tablet, Take 325 mg by mouth every evening. , Disp: , Rfl:  .  calcium carbonate (CALCIUM 600) 600 MG TABS tablet, Take 600 mg by mouth 2 (two) times daily with a meal., Disp: , Rfl:  .  carvedilol (COREG) 6.25 MG tablet, TAKE 1 TABLET BY MOUTH TWICE DAILY WITH MEALS, Disp: 180 tablet, Rfl: 1 .  Cholecalciferol (VITAMIN D3) 2000 UNITS capsule, Take 2,000 Units by mouth 2 (two) times daily. , Disp: , Rfl:  .  clobetasol ointment (TEMOVATE) 0.05 %,  Apply 1 application topically as needed., Disp: , Rfl:  .  clotrimazole-betamethasone (LOTRISONE) cream, Apply 1 application topically as needed., Disp: , Rfl:  .  fluticasone (FLONASE) 50 MCG/ACT nasal spray, Place 2 sprays into both nostrils daily., Disp: 16 g, Rfl: 6 .  furosemide (LASIX) 40 MG tablet, Take 1 tablet (40 mg total) by mouth daily., Disp: 30 tablet, Rfl: 1 .  glimepiride (AMARYL) 2 MG tablet, Take 2 mg by mouth daily with breakfast., Disp: , Rfl:  .  hydrocortisone (ANUSOL-HC) 2.5 % rectal cream, Place 1 application rectally 2 (two) times daily., Disp: , Rfl:  .  levothyroxine (SYNTHROID, LEVOTHROID) 150 MCG tablet, TAKE 1 TABLET BY MOUTH ONCE DAILY, Disp: 90 tablet, Rfl: 3 .  losartan (COZAAR) 25 MG tablet, Take 1 tablet (25 mg total) by mouth daily., Disp: 30 tablet, Rfl: 2 .  Magnesium 250  MG TABS, Take 250 mg by mouth 2 (two) times daily., Disp: , Rfl:  .  Omega-3 Fatty Acids (FISH OIL TRIPLE STRENGTH) 1400 MG CAPS, Take 1,400 mg by mouth 4 (four) times daily. , Disp: , Rfl:  .  omeprazole (PRILOSEC) 20 MG capsule, Take 1 capsule (20 mg total) by mouth 2 (two) times daily before a meal., Disp: 180 capsule, Rfl: 3 .  saccharomyces boulardii (FLORASTOR) 250 MG capsule, Take 1 capsule (250 mg total) by mouth 2 (two) times daily., Disp: 40 capsule, Rfl: 0 Social History   Socioeconomic History  . Marital status: Married    Spouse name: Not on file  . Number of children: Not on file  . Years of education: Not on file  . Highest education level: Not on file  Occupational History  . Not on file  Social Needs  . Financial resource strain: Not on file  . Food insecurity:    Worry: Not on file    Inability: Not on file  . Transportation needs:    Medical: Not on file    Non-medical: Not on file  Tobacco Use  . Smoking status: Never Smoker  . Smokeless tobacco: Never Used  Substance and Sexual Activity  . Alcohol use: No  . Drug use: No  . Sexual activity: Not on file  Lifestyle  . Physical activity:    Days per week: Not on file    Minutes per session: Not on file  . Stress: Not on file  Relationships  . Social connections:    Talks on phone: Not on file    Gets together: Not on file    Attends religious service: Not on file    Active member of club or organization: Not on file    Attends meetings of clubs or organizations: Not on file    Relationship status: Not on file  . Intimate partner violence:    Fear of current or ex partner: Not on file    Emotionally abused: Not on file    Physically abused: Not on file    Forced sexual activity: Not on file  Other Topics Concern  . Not on file  Social History Narrative   Lives with husband.  Four children.     Family History  Problem Relation Age of Onset  . Heart failure Mother        No details.  No MI  .  Cancer Father        Lung  . Cancer Brother        Lung    Objective: Office vital signs reviewed. BP 125/77  Pulse 72   Temp 98.1 F (36.7 C) (Oral)   Ht _0  (1.626 m)   Wt 246 lb (111.6 kg)   BMI 42.23 kg/m   Physical Examination:  General: Awake, alert, obese, No acute distress GI: obese, soft, non-tender, non-distended, bowel sounds present x4, no hepatomegaly, no splenomegaly, no masses Extremities: warm, well perfused, No edema, cyanosis or clubbing; +2 pulses bilaterally MSK: antlagic gait and station  Right hip: Patient has full active range of motion.  Mild pain with FABER.  Negative FADIR.  No tenderness to palpation of the greater trochanter.  No palpable bony abnormalities.  She has point tenderness over the anterior iliac crest.  Lumbar spine: No midline tenderness palpation.  No significant paraspinal muscle tenderness to palpation. Skin: dry; intact; no rashes or lesions Neuro: 4/5 Strength in abduction and extension of the right LE. Light touch sensation grossly intact  Dg Hip Unilat W Or W/o Pelvis 2-3 Views Right  Result Date: 03/31/2018 CLINICAL DATA:  Hip pain.  Sciatica. EXAM: DG HIP (WITH OR WITHOUT PELVIS) 2-3V RIGHT COMPARISON:  CT 02/23/2018. FINDINGS: Lumbar spine degenerative change. Prior lumbar spine fusion. Hardware intact. Degenerative changes both hips. No acute abnormality. No evidence of fracture dislocation. Pelvic calcifications consistent phleboliths. IMPRESSION: 1. Degenerative changes and postsurgical changes lumbar spine. Degenerative changes both hips. 2. No acute abnormality identified. No evidence of fracture or dislocation. Electronically Signed   By: Marcello Moores  Register   On: 03/31/2018 11:45    Assessment/ Plan: 77 y.o. female   1. Right hip pain Physical exam notable for 4-5 strength in abduction and extension of the right lower extremity.  She had mild pain with FABER and with palpation to the anterior iliac crest.  X-rays were  obtained.personal review of the x-rays was difficult secondary to poor penetration of the films secondary to body habitus.  Degenerative changes noted bilaterally and hardware was appreciated within the lumbar spine.  For more review by radiologist commented degenerative changes in bilateral hips and postsurgical changes in the lumbar spine but found no acute abnormalities.  Prednisone and Zanaflex were prescribed to the patient.  I have also placed a referral to orthopedic surgery.  We will see if they can see her today. - DG HIP UNILAT W OR W/O PELVIS 2-3 VIEWS RIGHT; Future - Ambulatory referral to Orthopedic Surgery  2. History of lumbar surgery As above  3. Sepsis, due to unspecified organism, unspecified whether acute organ dysfunction present [facility]) This was not addressed during today's visit.  Labs were released and collected during today's visit which were ordered by a different provider. - CBC with Differential/Platelet - BMP8+EGFR   Orders Placed This Encounter  Procedures  . DG HIP UNILAT W OR W/O PELVIS 2-3 VIEWS RIGHT    Standing Status:   Future    Number of Occurrences:   1    Standing Expiration Date:   05/30/2019    Order Specific Question:   Reason for Exam (SYMPTOM  OR DIAGNOSIS REQUIRED)    Answer:   pain    Order Specific Question:   Preferred imaging location?    Answer:   Internal   Meds ordered this encounter  Medications  . predniSONE (STERAPRED UNI-PAK 21 TAB) 10 MG (21) TBPK tablet    Sig: As directed x 6 days    Dispense:  21 tablet    Refill:  0  . tiZANidine (ZANAFLEX) 4 MG tablet    Sig: Take 0.5-1 tablets (2-4 mg total) by  mouth every 8 (eight) hours as needed for muscle spasms.    Dispense:  30 tablet    Refill:  Peck, DO [place] [phone]

## 2018-04-01 LAB — BMP8+EGFR
BUN/Creatinine Ratio: 14 (ref 12–28)
BUN: 14 mg/dL (ref 8–27)
CO2: 24 mmol/L (ref 20–29)
Calcium: 9.5 mg/dL (ref 8.7–10.3)
Chloride: 100 mmol/L (ref 96–106)
Creatinine, Ser: 0.98 mg/dL (ref 0.57–1.00)
GFR calc Af Amer: 65 mL/min/{1.73_m2} (ref >59)
GFR calc non Af Amer: 56 mL/min/{1.73_m2} — ABNORMAL LOW (ref >59)
Glucose: 103 mg/dL — ABNORMAL HIGH (ref 65–99)
Potassium: 3.9 mmol/L (ref 3.5–5.2)
Sodium: 143 mmol/L (ref 134–144)

## 2018-04-01 LAB — CBC WITH DIFFERENTIAL/PLATELET
Basophils Absolute: 0.1 10*3/uL (ref 0.0–0.2)
Basos: 1 %
EOS (ABSOLUTE): 0.2 10*3/uL (ref 0.0–0.4)
Eos: 2 %
Hematocrit: 37.9 % (ref 34.0–46.6)
Hemoglobin: 12.4 g/dL (ref 11.1–15.9)
Immature Grans (Abs): 0 10*3/uL (ref 0.0–0.1)
Immature Granulocytes: 0 %
Lymphocytes Absolute: 2.3 10*3/uL (ref 0.7–3.1)
Lymphs: 30 %
MCH: 28.3 pg (ref 26.6–33.0)
MCHC: 32.7 g/dL (ref 31.5–35.7)
MCV: 87 fL (ref 79–97)
Monocytes Absolute: 0.8 10*3/uL (ref 0.1–0.9)
Monocytes: 11 %
Neutrophils Absolute: 4.3 10*3/uL (ref 1.4–7.0)
Neutrophils: 56 %
Platelets: 356 10*3/uL (ref 150–450)
RBC: 4.38 x10E6/uL (ref 3.77–5.28)
RDW: 13.2 % (ref 12.3–15.4)
WBC: 7.7 10*3/uL (ref 3.4–10.8)

## 2018-04-10 ENCOUNTER — Telehealth: Payer: Self-pay

## 2018-04-10 NOTE — Telephone Encounter (Signed)
FYI: Pt is having severe pain in hip. She saw [facility] and they recommend she f/u with her previous neurosurgeon(Dr. Joya Salm who has retired).Office note is in [place]. She was seeking advice as to which doctor to see. I told her we send to anyone in that same group,but she would need an MRI before they would see her.Pt is OOT until 12/13 and has an OV with you on 12/16. She will discuss this further with you then,and I told her we would not pursue until then due to insurance authorization. We did not want  to get it approved and then approval date expire before she returned.  Pt has pain medicine,so she is fine that way,but was basically seeking advice as to what her next step should be. I told her that I would let you know about our conversation

## 2018-04-24 ENCOUNTER — Ambulatory Visit (INDEPENDENT_AMBULATORY_CARE_PROVIDER_SITE_OTHER): Payer: Medicare Other | Admitting: Family Medicine

## 2018-04-24 ENCOUNTER — Encounter: Payer: Self-pay | Admitting: Family Medicine

## 2018-04-24 ENCOUNTER — Ambulatory Visit: Payer: Self-pay | Admitting: *Deleted

## 2018-04-24 VITALS — BP 136/82 | HR 72 | Temp 97.3°F | Ht 64.0 in | Wt 250.2 lb

## 2018-04-24 DIAGNOSIS — E785 Hyperlipidemia, unspecified: Secondary | ICD-10-CM

## 2018-04-24 DIAGNOSIS — E1159 Type 2 diabetes mellitus with other circulatory complications: Secondary | ICD-10-CM | POA: Diagnosis not present

## 2018-04-24 DIAGNOSIS — K219 Gastro-esophageal reflux disease without esophagitis: Secondary | ICD-10-CM

## 2018-04-24 DIAGNOSIS — I10 Essential (primary) hypertension: Secondary | ICD-10-CM | POA: Diagnosis not present

## 2018-04-24 DIAGNOSIS — E1169 Type 2 diabetes mellitus with other specified complication: Secondary | ICD-10-CM

## 2018-04-24 DIAGNOSIS — I152 Hypertension secondary to endocrine disorders: Secondary | ICD-10-CM

## 2018-04-24 DIAGNOSIS — E039 Hypothyroidism, unspecified: Secondary | ICD-10-CM

## 2018-04-24 DIAGNOSIS — G4733 Obstructive sleep apnea (adult) (pediatric): Secondary | ICD-10-CM

## 2018-04-24 DIAGNOSIS — M5416 Radiculopathy, lumbar region: Secondary | ICD-10-CM

## 2018-04-24 LAB — BAYER DCA HB A1C WAIVED: HB A1C (BAYER DCA - WAIVED): 6.5 % (ref <7.0)

## 2018-04-24 MED ORDER — LOSARTAN POTASSIUM 25 MG PO TABS: 25.0000 mg | ORAL_TABLET | Freq: Every day | ORAL | 3 refills | Status: DC

## 2018-04-24 MED ORDER — FUROSEMIDE 40 MG PO TABS: 40.0000 mg | ORAL_TABLET | Freq: Every day | ORAL | 3 refills | Status: DC

## 2018-04-24 MED ORDER — TIZANIDINE HCL 4 MG PO TABS: 2.0000 mg | ORAL_TABLET | Freq: Three times a day (TID) | ORAL | 0 refills | Status: DC | PRN

## 2018-04-24 NOTE — Chronic Care Management (AMB) (Signed)
  Chronic Care Management   Note  04/24/2018 Name: Karen Potter MRN: [id number] DOB: 08/12/1940  [name] was seen in our office today by Dr Dettinger for ongoing management of her chronic medical conditions. These include diabetes, hypertension, hyperlipidemia, and hypothyroidism.  Ms. Verne was given information about Chronic Care Management services today including:  1. CCM service includes personalized support from designated clinical staff supervised by her physician, including individualized plan of care and coordination with other care providers 2. 24/7 contact phone numbers for assistance for urgent and routine care needs. 3. Service will only be billed when office clinical staff spend 20 minutes or more in a month to coordinate care. 4. Only one practitioner may furnish and bill the service in a calendar month. 5. The patient may stop CCM services at any time (effective at the end of the month) by phone call to the office staff. 6. The patient will be responsible for cost sharing (co-pay) of up to 20% of the service fee (after annual deductible is met). [name]'s insurance plan covers CCM services at 100% and therefore her portion is 0%.   Patient agreed to services and verbal consent obtained.  Plan I will follow up with [name] within the next 7 days about obtaining a blood glucose monitor for home use.  I will obtain her most recent colonoscopy report from Dr Watt Climes at [facility] and have it scanned into health maintenance in [facility].  I will obtain the most recent eye exam report for her ophthalmologist and have it scanned into health maintenance in [place], RN-BC, BSN Nurse Case Manager [place] Ph: [phone]

## 2018-04-24 NOTE — Progress Notes (Signed)
Cardiology Office Note   Date:  04/26/2018   ID:  Karen Potter, Karen Potter 09-06-1940, MRN [id number]  PCP:  Dettinger, Fransisca Kaufmann, MD  Cardiologist:   No primary care provider on file. Referring:  Dettinger, Fransisca Kaufmann, MD  Chief Complaint  Patient presents with  . Shortness of Breath      History of Present Illness: Karen Potter is a 77 y.o. female who presents for evaluation of shortness of breath.  The patient did have an echo in May with a small pericardial effusion but normal EF.  This was done to evaluate cardiomegaly and cough.   She been having a cough since April and was treated with antibiotics and steroids and actually improved.  However, somewhere around May she started having increasing shortness of breath.  Of note she had the echocardiogram with a small pericardial effusion but no significant valvular abnormalities and a normal ejection fraction.  She had a follow-up chest x-ray a couple of months later that suggested increased cardiac silhouette size but there was no vascular congestion reported.  Echo in Sept demonstrated no pericardial effusion.   She was in the hospital in October with sepsis related to [place]. I reviewed these records for this visit.     Since being placed on a higher dose of diuretic she is breathing better.  She denies any cardiovascular symptoms such as shortness of breath, PND or orthopnea.  She did have one episode of chest discomfort when she was in [facility] not long ago.  However, she has not had this before or since.  It lasted for about 5 minutes.  She was walking in a mall.  She is able to do grocery shopping.  She goes up and down her basement steps.  She has not been having any chest discomfort with this  She is not in any new leg edema and this is improved compared to previous.     Past Medical History:  Diagnosis Date  . Duodenal ulcer without hemorrhage or perforation    08-29-2015 per EGD reort  . Fatty liver   . Gastric ulcer  without hemorrhage or perforation    08-29-2015 per EGD report  . Gastritis    per EGD 08-29-2015  . GERD (gastroesophageal reflux disease)   . Hearing loss    no hearing aids  . 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 History of kidney stones   . History of TIA (transient ischemic attack)    03-23-2014  w/ episode temporay amnesia  . Hyperlipidemia   . Hypertension   . Hypothyroidism   . Iron deficiency anemia   . LAFB (left anterior fascicular block)   . OA (osteoarthritis)   . OSA on CPAP    severe per study 2003  . 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 RBBB (right bundle branch block)   . Seasonal allergies   . Type 2 diabetes mellitus ([place])   . Vitamin D deficiency     Past Surgical History:  Procedure Laterality Date  . CARPAL TUNNEL RELEASE Right 05-10-2007  . [place]  and Appendectomy  . CYSTO/  RIGHT URETEROSCOPIC STONE EXTRACTON/  STENT PLACEMENT  06/ 2016   in [place]  . CYSTOSCOPY/RETROGRADE/URETEROSCOPY/STONE EXTRACTION WITH BASKET Right 11/17/2015   Procedure: CYSTOSCOPY/RETROGRADE PYELOGRAM RIGHT/DIAGNOSTIC RIGHT URETEROSCOPY/LASER RENAL CALCIFICATION/RIGHT STENT PLACEMENT;  Surgeon: Cleon Gustin, MD;  Location: [facility];  Service: Urology;  Laterality: Right;  . ESOPHAGOGASTRODUODENOSCOPY N/A 08/29/2015   Procedure: ESOPHAGOGASTRODUODENOSCOPY (EGD);  Surgeon: Danie Binder, MD;  [facility];  Service: Endoscopy;  Laterality: N/A;  . HOLMIUM LASER APPLICATION Right 09/25/8414   Procedure: HOLMIUM LASER APPLICATION;  Surgeon: Cleon Gustin, MD;  Location: [facility];  Service: Urology;  Laterality: Right;  . KNEE ARTHROSCOPY  Right 1999  . LEFT HEEL REPAIR SURGERY  1954;  1985;  1995   polio  . LUMBAR SPINE SURGERY  09-01-2010   L4 -L5 fusion,  L3 laminectomy,  L5 - S1 foraminotomy  . OVARIAN CYST SURGERY Right 1966  . PARATHYROIDECTOMY N/A 05/06/2015   Procedure: PARATHYROIDECTOMY;  Surgeon: Armandina Gemma, MD;  Location: [facility];  Service: General;  Laterality: N/A;   left superior  . TONSILLECTOMY  as child  . TOTAL ABDOMINAL HYSTERECTOMY W/ BILATERAL SALPINGOOPHORECTOMY  1984  . TOTAL KNEE ARTHROPLASTY Right 04-08-2008  . TRANSTHORACIC ECHOCARDIOGRAM  04-07-2012   grade 1 diastolic function,  ef 60-63%/  mild AV calcification without stenosis/  trivial MR     Current Outpatient Medications  Medication Sig Dispense Refill  . aspirin 325 MG tablet Take 325 mg by mouth every evening.     . calcium carbonate (CALCIUM 600) 600 MG TABS tablet Take 600 mg by mouth 2 (two) times daily with a meal.    . carvedilol (COREG) 6.25 MG tablet TAKE 1 TABLET BY MOUTH TWICE DAILY WITH MEALS 180 tablet 1  . Cholecalciferol (VITAMIN D3) 2000 UNITS capsule Take 2,000 Units by mouth 2 (two) times daily.     . clobetasol ointment (TEMOVATE) 0.16 % Apply 1 application topically as needed.    . clotrimazole-betamethasone (LOTRISONE) cream Apply 1 application topically as needed.    . furosemide (LASIX) 40 MG tablet Take 1 tablet (40 mg total) by mouth daily. 90 tablet 3  . glimepiride (AMARYL) 2 MG tablet Take 2 mg by mouth daily with breakfast.    . hydrocortisone (ANUSOL-HC) 2.5 % rectal cream Place 1 application rectally 2 (two) times daily.    Marland Kitchen levothyroxine (SYNTHROID, LEVOTHROID) 150 MCG tablet TAKE 1 TABLET BY MOUTH ONCE DAILY 90 tablet 3  . losartan (COZAAR) 25 MG tablet Take 1 tablet (25 mg total) by mouth daily. 90 tablet 3  . Magnesium 250 MG TABS Take 250 mg by mouth 2 (two) times daily.    . Omega-3 Fatty Acids (FISH OIL TRIPLE STRENGTH) 1400 MG CAPS Take 1,000 mg by mouth 4 (four) times daily.     Marland Kitchen omeprazole  (PRILOSEC) 20 MG capsule Take 1 capsule (20 mg total) by mouth 2 (two) times daily before a meal. 180 capsule 3  . tiZANidine (ZANAFLEX) 4 MG tablet Take 0.5-1 tablets (2-4 mg total) by mouth every 8 (eight) hours as needed for muscle spasms. 30 tablet 0  . fluticasone (FLONASE) 50 MCG/ACT nasal spray Place 2 sprays into both nostrils daily. (Patient not taking: Reported on 04/26/2018) 16 g 6   No current facility-administered medications for this visit.     Allergies:   Codeine; Dilaudid [hydromorphone]; Other; and Doxycycline    ROS:  Please see the history of  present illness.   Otherwise, review of systems are positive for none.   All other systems are reviewed and negative.    PHYSICAL EXAM: VS:  BP 120/82   Ht 5\' 4"  (1.626 m)   Wt 246 lb (111.6 kg)   BMI 42.23 kg/m  , BMI Body mass index is 42.23 kg/m. GENERAL:  Well appearing NECK:  No jugular venous distention, waveform within normal limits, carotid upstroke brisk and symmetric, no bruits, no thyromegaly LUNGS:  Clear to auscultation bilaterally CHEST:  Unremarkable HEART:  PMI not displaced or sustained,S1 and S2 within normal limits, no S3, no S4, no clicks, no rubs, no murmurs ABD:  Flat, positive bowel sounds normal in frequency in pitch, no bruits, no rebound, no guarding, no midline pulsatile mass, no hepatomegaly, no splenomegaly EXT:  2 plus pulses throughout, no edema, no cyanosis no clubbing   EKG:  EKG is  ordered today. The ekg ordered today demonstrates sinus rhythm with short PR interval, rate 69, right bundle branch block, left anterior fascicular block   Recent Labs: 01/18/2018: NT-Pro BNP 68 02/25/2018: Magnesium 2.1 04/24/2018: ALT 25; BUN 14; Creatinine, Ser 0.94; Hemoglobin 12.7; Platelets 261; Potassium 4.1; Sodium 143; TSH 5.060    Lipid Panel    Component Value Date/Time   CHOL 145 04/24/2018 1156   CHOL 129 10/03/2012 1310   TRIG 227 (H) 04/24/2018 1156   TRIG 181 (H) 04/10/2013 1206   TRIG  264 (H) 10/03/2012 1310   HDL 34 (L) 04/24/2018 1156   HDL 39 (L) 04/10/2013 1206   HDL 29 (L) 10/03/2012 1310   CHOLHDL 4.3 04/24/2018 1156   CHOLHDL 3.5 04/08/2012 0426   VLDL 33 04/08/2012 0426   LDLCALC 66 04/24/2018 1156   LDLCALC 37 04/10/2013 1206   LDLCALC 47 10/03/2012 1310      Wt Readings from Last 3 Encounters:  04/26/18 246 lb (111.6 kg)  04/24/18 250 lb 3.2 oz (113.5 kg)  03/31/18 246 lb (111.6 kg)      Other studies Reviewed: Additional studies/ records that were reviewed today include:  Hospital records, labs Review of the above records demonstrates:      ASSESSMENT AND PLAN:  PERICARDIAL EFFUSION:   There was no evidence of this on her echo in the fall.   No change in therapy is indicated.  DOE:   This is improved.  She will continue on the current dose of diuretic.  I did review her labs and her potassium and renal function were fine just yesterday.  No change in therapy or further testing is indicated.  ABNORMAL EKG:    She does have the conduction disturbances which she is not having any presyncope or syncope.  We talked about what to do should this happen as I want to hear about it.  No change in therapy at this point however.  SLEEP APNEA:   The patient has new CPAP and she is not sure that is quite working well as she might be a "mouth breather".  She is going to discuss this with the prescribing physician.  OBESITY:  The patient understands the need to lose weight with diet and exercise. We have discussed specific strategies for this.  Current medicines are reviewed at length with the patient today.  The patient does not have concerns regarding medicines.  The following changes have been made:  None  Labs/ tests ordered today include:  None  Orders Placed This Encounter  Procedures  . EKG 12-Lead  Disposition:   FU with as needed.     Signed, Minus Breeding, MD  04/26/2018 11:51 AM    [place]

## 2018-04-24 NOTE — Progress Notes (Signed)
BP 136/82   Pulse 72   Temp (!) 97.3 F (36.3 C) (Oral)   Ht 5' 4" (1.626 m)   Wt 250 lb 3.2 oz (113.5 kg)   BMI 42.95 kg/m    Subjective:    Patient ID: Karen Potter, female    DOB: Nov 08, 1940, 77 y.o.   MRN: [id number]  HPI: Karen Potter is a 77 y.o. female presenting on 04/24/2018 for Diabetes (check up of chronic medical conditions. Patient states that she has been having sciatic pain in right leg but it has gotten better.); Hypothyroidism;   HPI Type 2 diabetes mellitus Patient comes in today for recheck of his diabetes. Patient has been currently taking glimepiride. Patient is currently on an ACE inhibitor/ARB. Patient has not seen an ophthalmologist this year. Patient denies any issues with their feet.   Hypothyroidism recheck Patient is coming in for thyroid recheck today as well. They deny any issues with hair changes or heat or cold problems or diarrhea or constipation. They deny any chest pain or palpitations. They are currently on levothyroxine 122mcrograms   Hypertension Patient is currently on losartan and carvedilol, and their blood pressure today is 136/82. Patient denies any lightheadedness or dizziness. Patient denies headaches, blurred vision, chest pains, shortness of breath, or weakness. Denies any side effects from medication and is content with current medication.   GERD Patient is currently on omeprazole.  She denies any major symptoms or abdominal pain or belching or burping. She denies any blood in her stool or lightheadedness or dizziness.   Right low back pain Patient has been having right low back pain that radiates down into her right leg with sciatic pain.  She has been doing exercises and also has been doing muscle relaxers but it has not been improving she has had this previously and has previously used conservative treatment for this but she says it is continued and now she is having the numbness going down into her foot as well.  The  sciatica has improved slightly but is still bothering her and now she is having that numbness amounts with concerning her.  Relevant past medical, surgical, family and social history reviewed and updated as indicated. Interim medical history since our last visit reviewed. Allergies and medications reviewed and updated.  Review of Systems  Constitutional: Negative for chills and fever.  Eyes: Negative for redness and visual disturbance.  Respiratory: Negative for chest tightness and shortness of breath.   Cardiovascular: Negative for chest pain and leg swelling.  Musculoskeletal: Positive for back pain. Negative for gait problem.  Skin: Negative for rash.  Neurological: Positive for numbness. Negative for dizziness, weakness, light-headedness and headaches.  Psychiatric/Behavioral: Negative for agitation and behavioral problems.  All other systems reviewed and are negative.   Per HPI unless specifically indicated above   Allergies as of 04/24/2018      Reactions   Codeine Itching   Dilaudid [hydromorphone] Other (See Comments)   Mouth blisters   Other Itching   Most pain meds cause itching.  When has to take pain medication, she has been instructed to take Benadryl   Doxycycline Rash      Medication List       Accurate as of April 24, 2018 11:08 AM. Always use your most recent med list.        aspirin 325 MG tablet Take 325 mg by mouth every evening.   CALCIUM 600 600 MG Tabs tablet Generic drug:  calcium carbonate Take 600  mg by mouth 2 (two) times daily with a meal.   carvedilol 6.25 MG tablet Commonly known as:  COREG TAKE 1 TABLET BY MOUTH TWICE DAILY WITH MEALS   clobetasol ointment 0.05 % Commonly known as:  TEMOVATE Apply 1 application topically as needed.   clotrimazole-betamethasone cream Commonly known as:  LOTRISONE Apply 1 application topically as needed.   FISH OIL TRIPLE STRENGTH 1400 MG Caps Take 1,000 mg by mouth 4 (four) times daily.     fluticasone 50 MCG/ACT nasal spray Commonly known as:  FLONASE Place 2 sprays into both nostrils daily.   furosemide 40 MG tablet Commonly known as:  LASIX Take 1 tablet (40 mg total) by mouth daily.   glimepiride 2 MG tablet Commonly known as:  AMARYL Take 2 mg by mouth daily with breakfast.   hydrocortisone 2.5 % rectal cream Commonly known as:  ANUSOL-HC Place 1 application rectally 2 (two) times daily.   levothyroxine 150 MCG tablet Commonly known as:  SYNTHROID, LEVOTHROID TAKE 1 TABLET BY MOUTH ONCE DAILY   losartan 25 MG tablet Commonly known as:  COZAAR Take 1 tablet (25 mg total) by mouth daily.   Magnesium 250 MG Tabs Take 250 mg by mouth 2 (two) times daily.   omeprazole 20 MG capsule Commonly known as:  PRILOSEC Take 1 capsule (20 mg total) by mouth 2 (two) times daily before a meal.   tiZANidine 4 MG tablet Commonly known as:  ZANAFLEX Take 0.5-1 tablets (2-4 mg total) by mouth every 8 (eight) hours as needed for muscle spasms.   Vitamin D3 50 MCG (2000 UT) capsule Take 2,000 Units by mouth 2 (two) times daily.          Objective:    BP 136/82   Pulse 72   Temp (!) 97.3 F (36.3 C) (Oral)   Ht 5' 4" (1.626 m)   Wt 250 lb 3.2 oz (113.5 kg)   BMI 42.95 kg/m   Wt Readings from Last 3 Encounters:  04/24/18 250 lb 3.2 oz (113.5 kg)  03/31/18 246 lb (111.6 kg)  03/06/18 248 lb (112.5 kg)    Physical Exam Vitals signs and nursing note reviewed.  Constitutional:      General: She is not in acute distress.    Appearance: She is well-developed. She is not diaphoretic.  Eyes:     Conjunctiva/sclera: Conjunctivae normal.  Cardiovascular:     Rate and Rhythm: Normal rate and regular rhythm.     Heart sounds: Normal heart sounds. No murmur.  Pulmonary:     Effort: Pulmonary effort is normal. No respiratory distress.     Breath sounds: Normal breath sounds. No wheezing.  Musculoskeletal: Normal range of motion.        General: Swelling (Slight  swelling on the edge of right great toenail, start of ingrown toenail and recommended wedging and return if worsens) and tenderness (Right low back pain, no weakness or numbness on exam, negative straight leg raise) present.  Skin:    General: Skin is warm and dry.     Findings: No rash.  Neurological:     Mental Status: She is alert and oriented to person, place, and time.     Coordination: Coordination normal.  Psychiatric:        Behavior: Behavior normal.         Assessment & Plan:   Problem List Items Addressed This Visit      Cardiovascular and Mediastinum   Hypertension associated with diabetes (HCC)     Relevant Medications   losartan (COZAAR) 25 MG tablet   furosemide (LASIX) 40 MG tablet   Other Relevant Orders   CMP14+EGFR (Completed)   Ambulatory referral to Chronic Care Management Services     Digestive   GERD (gastroesophageal reflux disease) (Chronic)   Relevant Orders   CBC with Differential/Platelet (Completed)     Endocrine   Hypothyroid   Relevant Orders   CBC with Differential/Platelet (Completed)   TSH (Completed)   Ambulatory referral to Chronic Care Management Services   Type 2 diabetes mellitus with other specified complication (HCC) - Primary   Relevant Medications   losartan (COZAAR) 25 MG tablet   Other Relevant Orders   CBC with Differential/Platelet (Completed)   CMP14+EGFR (Completed)   Lipid panel (Completed)   Bayer DCA Hb A1c Waived (Completed)   Ambulatory referral to Chronic Care Management Services     Other   Obesity, Class III, BMI 40-49.9 (morbid obesity) (HCC)   Relevant Orders   Lipid panel (Completed)   Ambulatory referral to Chronic Care Management Services    Other Visit Diagnoses    Acute right lumbar radiculopathy       Relevant Medications   tiZANidine (ZANAFLEX) 4 MG tablet   Other Relevant Orders   MR Lumbar Spine Wo Contrast     Patient also has the start of an ingrown toenail, recommended soaks and wedging  and call back if worsens  Follow up plan: Return in about 3 months (around 07/24/2018), or if symptoms worsen or fail to improve, for Diabetes and hypertension recheck.  Counseling provided for all of the vaccine components Orders Placed This Encounter  Procedures  . MR Lumbar Spine Wo Contrast  . CBC with Differential/Platelet  . CMP14+EGFR  . Lipid panel  . Bayer DCA Hb A1c Waived  . TSH    Joshua Dettinger, MD [facility] 04/24/2018, 11:08 AM     

## 2018-04-24 NOTE — Patient Instructions (Signed)
I will call you within the next 7 days to follow up obtaining a home blood sugar monitor I will request records from your gastroenterologist and eye doctor.   Karen Potter was given information about Chronic Care Management services today including:  1. CCM service includes personalized support from designated clinical staff supervised by her physician, including individualized plan of care and coordination with other care providers 2. 24/7 contact phone numbers for assistance for urgent and routine care needs. 3. Service will only be billed when office clinical staff spend 20 minutes or more in a month to coordinate care. 4. Only one practitioner may furnish and bill the service in a calendar month. 5. The patient may stop CCM services at any time (effective at the end of the month) by phone call to the office staff. 6. The patient will be responsible for cost sharing (co-pay) of up to 20% of the service fee (after annual deductible is met). Your insurance covers CCM services at 100% and therefore your portion would be 0%.   Patient agreed to services and verbal consent obtained.  The patient verbalized understanding of instructions provided today and declined a print copy of patient instruction materials.   Chong Sicilian, RN-BC, BSN Nurse Case Manager [facility] Ph: [phone]

## 2018-04-25 LAB — CBC WITH DIFFERENTIAL/PLATELET
Basophils Absolute: 0 10*3/uL (ref 0.0–0.2)
Basos: 1 %
EOS (ABSOLUTE): 0.3 10*3/uL (ref 0.0–0.4)
Eos: 4 %
Hematocrit: 37.5 % (ref 34.0–46.6)
Hemoglobin: 12.7 g/dL (ref 11.1–15.9)
Immature Grans (Abs): 0 10*3/uL (ref 0.0–0.1)
Immature Granulocytes: 0 %
Lymphocytes Absolute: 2.4 10*3/uL (ref 0.7–3.1)
Lymphs: 40 %
MCH: 28.9 pg (ref 26.6–33.0)
MCHC: 33.9 g/dL (ref 31.5–35.7)
MCV: 85 fL (ref 79–97)
Monocytes Absolute: 0.6 10*3/uL (ref 0.1–0.9)
Monocytes: 10 %
Neutrophils Absolute: 2.6 10*3/uL (ref 1.4–7.0)
Neutrophils: 45 %
Platelets: 261 10*3/uL (ref 150–450)
RBC: 4.39 x10E6/uL (ref 3.77–5.28)
RDW: 13.7 % (ref 12.3–15.4)
WBC: 5.9 10*3/uL (ref 3.4–10.8)

## 2018-04-25 LAB — CMP14+EGFR
ALT: 25 IU/L (ref 0–32)
AST: 19 IU/L (ref 0–40)
Albumin/Globulin Ratio: 1.4 (ref 1.2–2.2)
Albumin: 3.9 g/dL (ref 3.5–4.8)
Alkaline Phosphatase: 78 IU/L (ref 39–117)
BUN/Creatinine Ratio: 15 (ref 12–28)
BUN: 14 mg/dL (ref 8–27)
Bilirubin Total: 0.4 mg/dL (ref 0.0–1.2)
CO2: 22 mmol/L (ref 20–29)
Calcium: 9.4 mg/dL (ref 8.7–10.3)
Chloride: 102 mmol/L (ref 96–106)
Creatinine, Ser: 0.94 mg/dL (ref 0.57–1.00)
GFR calc Af Amer: 68 mL/min/{1.73_m2} (ref >59)
GFR calc non Af Amer: 59 mL/min/{1.73_m2} — ABNORMAL LOW (ref >59)
Globulin, Total: 2.8 g/dL (ref 1.5–4.5)
Glucose: 111 mg/dL — ABNORMAL HIGH (ref 65–99)
Potassium: 4.1 mmol/L (ref 3.5–5.2)
Sodium: 143 mmol/L (ref 134–144)
Total Protein: 6.7 g/dL (ref 6.0–8.5)

## 2018-04-25 LAB — LIPID PANEL
Chol/HDL Ratio: 4.3 ratio (ref 0.0–4.4)
Cholesterol, Total: 145 mg/dL (ref 100–199)
HDL: 34 mg/dL — ABNORMAL LOW (ref >39)
LDL Calculated: 66 mg/dL (ref 0–99)
Triglycerides: 227 mg/dL — ABNORMAL HIGH (ref 0–149)
VLDL Cholesterol Cal: 45 mg/dL — ABNORMAL HIGH (ref 5–40)

## 2018-04-25 LAB — TSH: TSH: 5.06 u[IU]/mL — ABNORMAL HIGH (ref 0.450–4.500)

## 2018-04-26 ENCOUNTER — Encounter: Payer: Self-pay | Admitting: Cardiology

## 2018-04-26 ENCOUNTER — Ambulatory Visit (INDEPENDENT_AMBULATORY_CARE_PROVIDER_SITE_OTHER): Payer: Medicare Other | Admitting: Cardiology

## 2018-04-26 VITALS — BP 120/82 | Ht 64.0 in | Wt 246.0 lb

## 2018-04-26 DIAGNOSIS — R0602 Shortness of breath: Secondary | ICD-10-CM | POA: Diagnosis not present

## 2018-04-26 DIAGNOSIS — C801 Malignant (primary) neoplasm, unspecified: Secondary | ICD-10-CM

## 2018-04-26 DIAGNOSIS — Z8673 Personal history of transient ischemic attack (TIA), and cerebral infarction without residual deficits: Secondary | ICD-10-CM

## 2018-04-26 DIAGNOSIS — R9431 Abnormal electrocardiogram [ECG] [EKG]: Secondary | ICD-10-CM | POA: Diagnosis not present

## 2018-04-26 DIAGNOSIS — I3131 Malignant pericardial effusion in diseases classified elsewhere: Secondary | ICD-10-CM | POA: Insufficient documentation

## 2018-04-26 DIAGNOSIS — I313 Pericardial effusion (noninflammatory): Secondary | ICD-10-CM | POA: Diagnosis not present

## 2018-04-26 NOTE — Patient Instructions (Signed)
Medication Instructions:  The current medical regimen is effective;  continue present plan and medications.  If you need a refill on your cardiac medications before your next appointment, please call your pharmacy.   Follow-Up: Follow up as needed with Dr Hochrein.  Thank you for choosing [facility]!!      

## 2018-05-08 ENCOUNTER — Ambulatory Visit (HOSPITAL_COMMUNITY)
Admission: RE | Admit: 2018-05-08 | Discharge: 2018-05-08 | Disposition: A | Discharge status: Home / Self Care | Payer: Medicare Other | Source: Ambulatory Visit | Attending: Family Medicine | Admitting: Family Medicine

## 2018-05-08 DIAGNOSIS — M5416 Radiculopathy, lumbar region: Secondary | ICD-10-CM | POA: Diagnosis not present

## 2018-05-08 DIAGNOSIS — M545 Low back pain: Secondary | ICD-10-CM | POA: Diagnosis not present

## 2018-05-11 ENCOUNTER — Other Ambulatory Visit: Payer: Self-pay | Admitting: *Deleted

## 2018-05-11 DIAGNOSIS — M48061 Spinal stenosis, lumbar region without neurogenic claudication: Secondary | ICD-10-CM

## 2018-05-12 ENCOUNTER — Ambulatory Visit (INDEPENDENT_AMBULATORY_CARE_PROVIDER_SITE_OTHER): Payer: Medicare Other | Admitting: *Deleted

## 2018-05-12 DIAGNOSIS — E1169 Type 2 diabetes mellitus with other specified complication: Secondary | ICD-10-CM

## 2018-05-12 MED ORDER — BLOOD GLUCOSE MONITOR SYSTEM W/DEVICE KIT: PACK | 0 refills | Status: AC

## 2018-05-12 MED ORDER — GLUCOSE BLOOD VI STRP: ORAL_STRIP | 12 refills | Status: DC

## 2018-05-12 MED ORDER — LANCETS MISC. KIT: PACK | 12 refills | Status: DC

## 2018-05-12 NOTE — Patient Instructions (Signed)
Visit Information  Goals Addressed    . "I would like to get a new monitor so that I can take better care of my diabetes" (pt-stated)       Nurse Case Manager Clinical Goal(s): over the next 7 days patient will verbalize understanding of instructions for self-managing diabetes and for proper usage of glucose meter and supplies.   Interventions:   Researched covered glucose meter and supplies on [facility] formulary.  Researched cost of OTC ReliOn meter and supplies at [employer], strips, and lancets per Dr Building control surveyor.  Patient Self Care Activities: 1. Compare the cost of Rx meter and supplies to OTC ReliOn Meter and supplies 2. Test blood sugar up to 3 times daily as directed by her physician 3. Record readings in log to bring in to next visit  Please see past updates related to this goal by clicking on the "Past Updates" button in the selected goal        Education or Materials Provided:  . Discussed cost of ReliOn glucose meter and supplies OTC at [facility] . Advised that script was sent in for a covered meter and supplies that was listed on the [facility] formularly . Check blood sugar up to 3 times daily as advised and record in the blue book that we gave you  Plan: I will call you within the next week to see if you have been able to pickup a meter and supplies and if you are having any problems with using it. We also need to schedule an in office face-to-face initial CCM visit.   The patient verbalized understanding of instructions provided today and declined a print copy of patient instruction materials.   Chong Sicilian, RN-BC, BSN Nurse Case Manager [facility] Ph: [phone]

## 2018-05-12 NOTE — Chronic Care Management (AMB) (Signed)
  Chronic Care Management   Note  05/12/2018 Name: Karen Potter MRN: [id number] DOB: 1941/05/04  Spoke with patient by phone regarding home diabetes management. She does not have a glucose meter, although she has used one in the past, and has been unable to check her blood sugar. She is not on medication for diabetes currently but is trying to manage with proper diet.  Goals Addressed    . "I would like to get a new monitor so that I can take better care of my diabetes" (pt-stated)       Nurse Case Manager Clinical Goal(s): over the next 7 days patient will verbalize understanding of instructions for self-managing diabetes and for proper usage of glucose meter and supplies.   Interventions:   Researched covered glucose meter and supplies on [facility] formulary.  Researched cost of OTC ReliOn meter and supplies at [employer], strips, and lancets per Dr Building control surveyor.  Patient Self Care Activities: 1. Compare the cost of Rx meter and supplies to OTC ReliOn Meter and supplies 2. Test blood sugar up to 3 times daily as directed by her physician 3. Record readings in log to bring in to next visit  Please see past updates related to this goal by clicking on the "Past Updates" button in the selected goal          Plan I will follow up with patient by phone within the next 7 days to see if she has obtained the meter and that she is comfortable with using it. I will plan to schedule her for an initial face-to-face CCM visit during that call as well.   Chong Sicilian, RN-BC, BSN Nurse Case Manager [facility] Ph: [phone]

## 2018-05-16 ENCOUNTER — Ambulatory Visit: Payer: Self-pay | Admitting: *Deleted

## 2018-05-16 ENCOUNTER — Telehealth: Payer: Medicare Other

## 2018-05-16 DIAGNOSIS — H903 Sensorineural hearing loss, bilateral: Secondary | ICD-10-CM | POA: Diagnosis not present

## 2018-05-16 DIAGNOSIS — E1169 Type 2 diabetes mellitus with other specified complication: Secondary | ICD-10-CM

## 2018-05-16 NOTE — Chronic Care Management (AMB) (Signed)
  Chronic Care Management   Note  05/16/2018 Name: Karen Potter MRN: [id number] DOB: Sep 06, 1940  Unsuccessful attempt at telephone follow-up. Left message on patient's personal voicemail that I will be out of the office tomorrow and that I will call her again on Thursday.    Chong Sicilian, RN-BC, BSN Nurse Case Manager [facility] Ph: [phone]

## 2018-05-18 ENCOUNTER — Ambulatory Visit: Payer: Medicare Other

## 2018-05-18 DIAGNOSIS — H25813 Combined forms of age-related cataract, bilateral: Secondary | ICD-10-CM | POA: Diagnosis not present

## 2018-05-19 ENCOUNTER — Telehealth: Payer: Self-pay | Admitting: *Deleted

## 2018-05-19 NOTE — Patient Instructions (Signed)
Visit Information  Goals Addressed    . "I wnat to find out about the last colonoscopy results from Dr Watt Climes" (pt-stated)       Nurse Case Manager Clinical Goal(s): Over the next 7 days patient will verbalize understanding of how to access documented patient report in medical record via patient portal.  Interventions:  Re-faxed request for colonoscopy report to [facility] at [phone]    . "I would like to get a new monitor so that I can take better care of my diabetes" (pt-stated)       Nurse Case Manager Clinical Goal(s): Over the next 3 weeks patient will monitor her blood sugar daily and keep a log of the results  Interventions:   Verified that patient had picked up glucose meter and supplies  Verified with patient that she is knowledgeable about the new meter and supplies  Will follow up with patient by phone in 3 weeks and schedule a face-to face visit where I can review the log with her  Plan to provide and discuss EMMI handouts on diabetes management  Plan to provide discuss EMMI handouts on appropriate diet  Patient Self Care Activities:  Picked up new glucose meter and supplies  Able to check blood sugar on her own and keep a log  Prepares meals and feeds herself  Please see past updates related to this goal by clicking on the "Past Updates" button in the selected goal         Education or Materials Provided:  1. Continue to check blood sugars daily and as needed and record them 2. Notify our office if you have any readings outside of the provider recommended range 3. I will contact you within 2 weeks to discuss about the readings   The patient verbalized understanding of instructions provided today and declined a print copy of patient instruction materials.   Chong Sicilian, RN-BC, BSN Nurse Case Manager [facility] Ph: [phone]

## 2018-05-19 NOTE — Chronic Care Management (AMB) (Signed)
  Chronic Care Management   Follow Up Note   05/18/2018 Name: Karen Potter MRN: [id number] DOB: December 23, 1940  Referred by: Dettinger, Fransisca Kaufmann, MD Reason for referral : Chronic Care Management (Follow up telephone call)   Subjective: Ms Karen Potter was able to pick up her new meter and supplies at [employer]. She is not having any difficulty with using it properly She is currently checking her blood sugar daily and as needed.    Objective:  Lab Results  Component Value Date   HGBA1C 6.5 04/24/2018   HGBA1C 6.5 (H) 02/23/2018   HGBA1C 7.3 (H) 11/07/2017   Lab Results  Component Value Date   LDLCALC 66 04/24/2018   CREATININE 0.94 04/24/2018   BMI Readings from Last 3 Encounters:  04/26/18 42.23 kg/m  04/24/18 42.95 kg/m  03/31/18 42.23 kg/m    Assessment:  Goals Addressed    . "I wnat to find out about the last colonoscopy results from Dr Watt Climes" (pt-stated)       Nurse Case Manager Clinical Goal(s): Over the next 7 days patient will verbalize understanding of how to access documented patient report in medical record via patient portal.  Interventions:  Re-faxed request for colonoscopy report to [facility] at [phone]    . "I would like to get a new monitor so that I can take better care of my diabetes" (pt-stated)       Nurse Case Manager Clinical Goal(s): Over the next 3 weeks patient will monitor her blood sugar daily and keep a log of the results  Interventions:   Verified that patient had picked up glucose meter and supplies  Verified with patient that she is knowledgeable about the new meter and supplies  Will follow up with patient by phone in 3 weeks and schedule a face-to face visit where I can review the log with her  Plan to provide and discuss EMMI handouts on diabetes management  Plan to provide discuss EMMI handouts on appropriate diet  Patient Self Care Activities:  Picked up new glucose meter and supplies  Able to check blood sugar on  her own and keep a log  Prepares meals and feeds herself  Please see past updates related to this goal by clicking on the "Past Updates" button in the selected goal         Plan: Follow up by phone for ongoing CCM in 2 weeks Will discuss blood sugar readings Will schedule face to face visit Will address any other CCM concerns at that time   Chong Sicilian, RN-BC, BSN Nurse Case Manager [place] Ph: [phone]

## 2018-05-24 ENCOUNTER — Other Ambulatory Visit: Payer: Self-pay | Admitting: Family Medicine

## 2018-05-24 DIAGNOSIS — G4733 Obstructive sleep apnea (adult) (pediatric): Secondary | ICD-10-CM | POA: Diagnosis not present

## 2018-05-25 DIAGNOSIS — G4733 Obstructive sleep apnea (adult) (pediatric): Secondary | ICD-10-CM | POA: Diagnosis not present

## 2018-05-29 ENCOUNTER — Encounter: Payer: Self-pay | Admitting: Adult Health

## 2018-05-30 NOTE — Telephone Encounter (Signed)
Error

## 2018-05-31 ENCOUNTER — Encounter: Payer: Self-pay | Admitting: Neurology

## 2018-05-31 ENCOUNTER — Ambulatory Visit: Payer: Medicare Other | Admitting: Neurology

## 2018-05-31 VITALS — BP 145/88 | HR 71 | Ht 64.5 in | Wt 251.0 lb

## 2018-05-31 DIAGNOSIS — Z9989 Dependence on other enabling machines and devices: Secondary | ICD-10-CM | POA: Diagnosis not present

## 2018-05-31 DIAGNOSIS — G4733 Obstructive sleep apnea (adult) (pediatric): Secondary | ICD-10-CM

## 2018-05-31 NOTE — Patient Instructions (Signed)
Please continue using your CPAP regularly. While your insurance requires that you use CPAP at least 4 hours each night on 70% of the nights, I recommend, that you not skip any nights and use it throughout the night if you can. Getting used to CPAP and staying with the treatment long term does take time and patience and discipline. Untreated obstructive sleep apnea when it is moderate to severe can have an adverse impact on cardiovascular health and raise her risk for heart disease, arrhythmias, hypertension, congestive heart failure, stroke and diabetes. Untreated obstructive sleep apnea causes sleep disruption, nonrestorative sleep, and sleep deprivation. This can have an impact on your day to day functioning and cause daytime sleepiness and impairment of cognitive function, memory loss, mood disturbance, and problems focussing. Using CPAP regularly can improve these symptoms.  I will ask [employer] to get you in for a mask refit. You may want to try a nasal mask.

## 2018-05-31 NOTE — Progress Notes (Signed)
Subjective:    Patient ID: Karen Potter is a 78 y.o. female.  HPI     Interim history:   Karen Potter is a 78 year old right-handed woman with an underlying complex medical history of type 2 diabetes, right bundle branch block, history of polio, osteoarthritis, hypothyroidism, hypertension, hyperlipidemia, history of hepatitis B, history of TIA, reflux disease, seasonal allergies, peripheral edema, type 2 diabetes, and morbid obesity with BMI of over 40, who presents for follow-up consultation of her obstructive sleep apnea after recent sleep study testing and starting treatment with a new equipment. The patient is unaccompanied today. I first met her on 01/10/2018 at the request of her primary care physician, at which time she reported a long-standing history of sleep apnea and being on CPAP therapy. She needed reevaluation and new equipment. She was invited for sleep study testing. She had a baseline sleep study, followed by a CPAP titration study. I went over her test results with her in detail today. Baseline sleep study from 01/16/2018 showed a sleep efficiency markedly reduced at 40.6%, sleep latency was markedly delayed at 267 minutes, she had clear difficulty attaining sleep without using CPAP. She had an increased percentage of light stage sleep, REM sleep was absent. Total AHI was 30.3 per hour, average oxygen saturation 93%, nadir was 86% during non-REM sleep. She was invited for a CPAP titration study. She had this on 02/20/2018, sleep efficiency was 53.7%, sleep latency again delayed at 133 minutes. REM latency was 138.5 minutes, REM percentage reduced at 4.5%. She was titrated via full face mask as she is used to using a fullface mask. CPAP was titrated to a pressure of 10 cm. On the final pressure her AHI was 2.6 per hour with supine non-REM sleep achieved an O2 nadir of 87%. Based on her test results I suggested a home treatment pressure of 12 cm.  Today, 05/31/2018: I reviewed her  CPAP compliance data from 04/30/2018 through 05/29/2018, which is a total of 30 days, during which time she used her CPAP every night with percent used days greater than 4 hours at 100%, indicating superb compliance with an average usage of 7 hours and 10 minutes, residual AHI at goal at 1.1 per hour, leak on the high side with the 95th percentile at 24.2 L/m on a pressure of 12 cm with EPR of 1. She reports having adapted well to the machine but she struggling with the mask. She has 2 types of fullface mask but used to use a nasal mask. She also has a chinstrap. She is compliant with treatment but would like to get a different mask. She is motivated to continue with treatment. She indicates that she cannot sleep without CPAP and therefore also she had a tough time during the baseline sleep study without CPAP. At the time of her CPAP titration study she was coming down with an illness and did not sleep well. In hindsight, she realizes that she was not feeling well. She was actually hospitalized 2 days later and was diagnosed with sepsis, had Salmonella enterocolitis complicated by dehydration and acute kidney injury. I reviewed the hospital records.  The patient's allergies, current medications, family history, past medical history, past social history, past surgical history and problem list were reviewed and updated as appropriate.   Previously:  01/10/2018: (She) was previously diagnosed with obstructive sleep apnea and placed on CPAP therapy. She had sleep study testing in June 2003 with severe obstructive sleep apnea diagnosed with a baseline AHI of  92 per hour. She has been on CPAP. A compliance download was not possible today as she has an older machine. I reviewed your office note from 11/07/2017. Her Epworth sleepiness score is 15 out of 24, fatigue score is 60 out of 63. She complains of mouth dryness. 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 They have 4  children, 2 are adopted. She is a nonsmoker and does not typically utilize alcohol, she drinks caffeine in the form of soda, 2 to 3 bottles per day on average. She has had SOB with minimal exertion since April. She has been on antibiotics and has had a steroid shot. She has had chest x-ray. Her chest x-ray from 11/07/2017 showed mildly enlarged cardiac silhouette with mild bibasilar atelectasis. She reports being fully compliant with CPAP therapy, pressure is at 16, she does not like the ramp feature. She uses humidification and nasal mask which seems to fit well. She would not be opposed to retesting for sleep apnea. She does not feel that her shortness of breath during the day is related to her sleep apnea as she is fully compliant with treatment. Nevertheless, her Epworth sleepiness score is elevated. She also reports not always making enough time for sleep. She goes to bed around 2 and rise time varies. Sometimes she is up by 6 if she needs to and sometimes she may sleep until 10. She has nocturia about once per average night and denies morning headaches. She does not have a close family member with sleep apnea but some distant cousins. She had exposure to secondhand smoke when she was a child and also before her husband quit smoking. She had a tonsillectomy as a child. Her weight at the time of her sleep study in 2003 was 220, current weight of 258.  Her Past Medical History Is Significant For: Past Medical History:  Diagnosis Date  . Duodenal ulcer without hemorrhage or perforation    08-29-2015 per EGD reort  . Fatty liver   . Gastric ulcer without hemorrhage or perforation    08-29-2015 per EGD report  . Gastritis    per EGD 08-29-2015  . GERD (gastroesophageal reflux disease)   . Hearing loss    no hearing aids  . 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   age 59--  pt states was quarantined   no treatment ;  per  pt no symptoms or issues since  . History of kidney stones   . History of TIA (transient ischemic attack)    03-23-2014  w/ episode temporay amnesia  . Hyperlipidemia   . Hypertension   . Hypothyroidism   . Iron deficiency anemia   . LAFB (left anterior fascicular block)   . OA (osteoarthritis)   . OSA on CPAP    severe per study 2003  . 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 RBBB (right bundle branch block)   . Seasonal allergies   . Type 2 diabetes mellitus ([place])   . Vitamin D deficiency     Her Past Surgical History Is Significant For: Past Surgical History:  Procedure Laterality Date  . CARPAL TUNNEL RELEASE Right 05-10-2007  . [place]   and  Appendectomy  . CYSTO/  RIGHT URETEROSCOPIC STONE EXTRACTON/  STENT PLACEMENT  06/ 2016   in [place]  . CYSTOSCOPY/RETROGRADE/URETEROSCOPY/STONE EXTRACTION WITH BASKET Right 11/17/2015   Procedure: CYSTOSCOPY/RETROGRADE PYELOGRAM RIGHT/DIAGNOSTIC RIGHT URETEROSCOPY/LASER RENAL CALCIFICATION/RIGHT STENT PLACEMENT;  Surgeon: Cleon Gustin, MD;  Location: [facility];  Service: Urology;  Laterality: Right;  . ESOPHAGOGASTRODUODENOSCOPY N/A 08/29/2015   Procedure: ESOPHAGOGASTRODUODENOSCOPY (EGD);  Surgeon: Danie Binder, MD;  Location: [facility];  Service: Endoscopy;  Laterality: N/A;  . HOLMIUM LASER APPLICATION Right 7/90/2409   Procedure: HOLMIUM LASER APPLICATION;  Surgeon: Cleon Gustin, MD;  Location: [facility];  Service: Urology;  Laterality: Right;  . KNEE ARTHROSCOPY Right 1999  . LEFT HEEL REPAIR SURGERY  1954;  1985;  1995   polio  . LUMBAR SPINE SURGERY  09-01-2010   L4 -L5 fusion,  L3 laminectomy,  L5 - S1 foraminotomy  . OVARIAN CYST SURGERY Right 1966  . PARATHYROIDECTOMY N/A 05/06/2015   Procedure: PARATHYROIDECTOMY;  Surgeon: Armandina Gemma, MD;  Location: [facility];   Service: General;  Laterality: N/A;   left superior  . TONSILLECTOMY  as child  . TOTAL ABDOMINAL HYSTERECTOMY W/ BILATERAL SALPINGOOPHORECTOMY  1984  . TOTAL KNEE ARTHROPLASTY Right 04-08-2008  . TRANSTHORACIC ECHOCARDIOGRAM  04-07-2012   grade 1 diastolic function,  ef 73-53%/  mild AV calcification without stenosis/  trivial MR    Her Family History Is Significant For: Family History  Problem Relation Age of Onset  . Heart failure Mother        No details.  No MI  . Cancer Father        Lung  . Cancer Brother        Lung    Her Social History Is Significant For: Social History   Socioeconomic History  . Marital status: Married    Spouse name: Not on file  . Number of children: Not on file  . Years of education: Not on file  . Highest education level: Not on file  Occupational History  . Not on file  Social Needs  . Financial resource strain: Not on file  . Food insecurity:    Worry: Not on file    Inability: Not on file  . Transportation needs:    Medical: Not on file    Non-medical: Not on file  Tobacco Use  . Smoking status: Never Smoker  . Smokeless tobacco: Never Used  Substance and Sexual Activity  . Alcohol use: No  . Drug use: No  . Sexual activity: Not on file  Lifestyle  . Physical activity:    Days per week: Not on file    Minutes per session: Not on file  . Stress: Not on file  Relationships  . Social connections:    Talks on phone: Not on file    Gets together: Not on file    Attends religious service: Not on file    Active member of club or organization: Not on file    Attends meetings of clubs or organizations: Not on file    Relationship status: Not on file  Other Topics Concern  . Not on file  Social History Narrative   Lives with husband.  Four children.      Her Allergies Are:  Allergies  Allergen Reactions  . Codeine Itching  . Dilaudid [Hydromorphone] Other (See Comments)    Mouth blisters  . Other Itching    Most pain meds  cause itching.  When  has to take pain medication, she has been instructed to take Benadryl  . Doxycycline Rash  :   Her Current Medications Are:  Outpatient Encounter Medications as of 05/31/2018  Medication Sig  . aspirin 325 MG tablet Take 325 mg by mouth every evening.   . Blood Glucose Monitoring Suppl (BLOOD GLUCOSE MONITOR SYSTEM) w/Device KIT Use glucose meter to check blood sugar up to 3 times daily as instructed. One Touch Verio preferred by insurance. Dx: EE11.69  . calcium carbonate (CALCIUM 600) 600 MG TABS tablet Take 600 mg by mouth 2 (two) times daily with a meal.  . carvedilol (COREG) 6.25 MG tablet TAKE 1 TABLET BY MOUTH TWICE DAILY WITH MEALS  . Cholecalciferol (VITAMIN D3) 2000 UNITS capsule Take 2,000 Units by mouth 2 (two) times daily.   . clobetasol ointment (TEMOVATE) 1.11 % Apply 1 application topically as needed.  . clotrimazole-betamethasone (LOTRISONE) cream Apply 1 application topically as needed.  . fluticasone (FLONASE) 50 MCG/ACT nasal spray Place 2 sprays into both nostrils daily.  . furosemide (LASIX) 40 MG tablet Take 1 tablet (40 mg total) by mouth daily.  Marland Kitchen glimepiride (AMARYL) 2 MG tablet TAKE 1 TABLET BY MOUTH ONCE DAILY BEFORE BREAKFAST  . glucose blood test strip One Touch Verio- Use with glucose meter to check blood sugar up to 3 times daily as instructed. Dx: E11.69  . hydrocortisone (ANUSOL-HC) 2.5 % rectal cream Place 1 application rectally 2 (two) times daily.  . Lancets Misc. KIT Use patient & insurance preferred lancet device to test blood glucose up to 3 times daily for Dx: E11.69  . levothyroxine (SYNTHROID, LEVOTHROID) 175 MCG tablet TAKE ONE TABLET BY MOUTH ONCE DAILY SUNDAY-THURSDAY  (TAKE  150MCG  ON  FRIDAY)  . losartan (COZAAR) 25 MG tablet Take 1 tablet (25 mg total) by mouth daily.  . Magnesium 250 MG TABS Take 250 mg by mouth 2 (two) times daily.  . Omega-3 Fatty Acids (FISH OIL TRIPLE STRENGTH) 1400 MG CAPS Take 1,000 mg by mouth 4  (four) times daily.   Marland Kitchen omeprazole (PRILOSEC) 20 MG capsule Take 1 capsule (20 mg total) by mouth 2 (two) times daily before a meal.  . tiZANidine (ZANAFLEX) 4 MG tablet Take 0.5-1 tablets (2-4 mg total) by mouth every 8 (eight) hours as needed for muscle spasms.   No facility-administered encounter medications on file as of 05/31/2018.   :  Review of Systems:  Out of a complete 14 point review of systems, all are reviewed and negative with the exception of these symptoms as listed below: Review of Systems  Neurological:       Pt presents today to discuss her cpap. Pt reports that she has had trouble finding a good mask.    Objective:  Neurological Exam  Physical Exam Physical Examination:   Vitals:   05/31/18 1350  BP: (!) 145/88  Pulse: 71    General Examination: The patient is a very pleasant 78 y.o. female in no acute distress. She appears well-developed and well-nourished and well groomed.   HEENT: Normocephalic, atraumatic, pupils are equal, round and reactive to light and accommodation. corrective eyeglasses in place. Extraocular tracking is unremarkable. Hearing is grossly intact. Face is symmetric with normal facial animation, speech is without dysarthria. Slightly hoarse sounding. She has no lip, neck or jaw tremor, airway examination reveals moderate mouth dryness and moderate airway crowding. Tongue protrudes centrally and palate elevates symmetrically.   Chest: Clear to auscultation without wheezing, rhonchi or crackles noted.  Heart: S1+S2+0, regular and normal without murmurs, rubs or gallops noted.   Abdomen: Soft, non-tender and non-distended with normal bowel sounds appreciated on auscultation.  Extremities: There is no obvious change. Left distal leg is smaller in caliber (had polio as a child on the left side).  Skin: Warm and dry without trophic changes noted.  Musculoskeletal: exam reveals no obvious joint deformities, tenderness or joint swelling or  erythema.   Neurologically:  Mental status: The patient is awake, alert and oriented in all 4 spheres. Her immediate and remote memory, attention, language skills and fund of knowledge are appropriate. There is no evidence of aphasia, agnosia, apraxia or anomia. Speech is clear with normal prosody and enunciation. Thought process is linear. Mood is normal and affect is normal.  Cranial nerves II - XII are as described above under HEENT exam. Motor exam: Normal bulk (except L distal leg), strength and tone is noted. There is no drift, tremor or rebound. Romberg is negative. Fine motor skills and coordination: grossly intact.  Cerebellar testing: No dysmetria or intention tremor on finger.  Sensory exam: intact to light touch.  Gait, station and balance: She stands with mild difficulty and pushes herself up. She stands naturally slightly wide-based. She walks without a limp.  Assessment and Plan:  In summary, Karen Potter is a very pleasant 78 year old female with an underlying complex medical history of type 2 diabetes, right bundle branch block, history of polio, osteoarthritis, hypothyroidism, hypertension, hyperlipidemia, history of hepatitis B, history of TIA, reflux disease, seasonal allergies, peripheral edema, type 2 diabetes, and morbid obesity with BMI of over 40, who presents for follow-up consultation of her obstructive sleep apnea. Her severe sleep apnea diagnosis was confirmed with recent baseline testing in September 2019 although she did not sleep very well. She is not used to sleeping without her CPAP. She did reasonably well with her CPAP titration study in October 2019 but did have shortly thereafter hospitalization for salmonella enterocolitis and sepsis. She used to be on a higher CPAP pressure. She has adapted well to the lower CPAP pressure of 12 cm. Nevertheless, she is struggling with the mask currently. She is advised to make an appointment with her DME company to be fitted  with a nasal mask. We talked about her study results and compliance data at length. She is commended for her full treatment adherence and encouraged to be fully compliant ongoing. We can see her back routinely in one year, sooner if needed. If needed, we can also bring her into the sleep lab for a mask refit appointment with our sleep lab staff in the interim. I answered all her questions today and she was in agreement. I spent 25 minutes in total face-to-face time with the patient, more than 50% of which was spent in counseling and coordination of care, reviewing test results, reviewing medication and discussing or reviewing the diagnosis of OSA, its prognosis and treatment options. Pertinent laboratory and imaging test results that were available during this visit with the patient were reviewed by me and considered in my medical decision making (see chart for details).

## 2018-06-08 ENCOUNTER — Telehealth: Payer: Medicare Other

## 2018-06-20 ENCOUNTER — Other Ambulatory Visit: Payer: Self-pay | Admitting: Family Medicine

## 2018-06-20 DIAGNOSIS — Z1231 Encounter for screening mammogram for malignant neoplasm of breast: Secondary | ICD-10-CM

## 2018-06-21 ENCOUNTER — Ambulatory Visit (INDEPENDENT_AMBULATORY_CARE_PROVIDER_SITE_OTHER): Payer: Medicare Other | Admitting: *Deleted

## 2018-06-21 DIAGNOSIS — I152 Hypertension secondary to endocrine disorders: Secondary | ICD-10-CM

## 2018-06-21 DIAGNOSIS — I10 Essential (primary) hypertension: Secondary | ICD-10-CM | POA: Diagnosis not present

## 2018-06-21 DIAGNOSIS — E1159 Type 2 diabetes mellitus with other circulatory complications: Secondary | ICD-10-CM

## 2018-06-21 NOTE — Chronic Care Management (AMB) (Addendum)
Chronic Care Management   Telephone Note  06/21/2018 Name: Karen Potter MRN: [id number] DOB: Jun 15, 1940  Subjective: Incoming telephone call form Karen Potter with complaints of hypotension, generalized weakness, and dry mouth since this morning. Symptoms have actually improved some as the day has gone on. She denies any chest pain, shortness of breath, or UTI symptoms.She also denies any vomiting or diarrhea. Her blood sugars have been normal. I questioned if she has taken any new medications or supplements and the only change has been that she restarted Zanaflex yesterday after having not taken it for more than 30 days. She took both Zanaflex and Aleve as for back pain. Back pain has improved since then.   She reports home blood pressure reading of 77/42 and a pulse of 56 this morning. The afternoon reading improved to 104/66 and a pulse of 59. She continues to feel generally weak and her mouth is very dry. She is good about drinking water and has been sipping on it all day.   Objective:  BP Readings from Last 3 Encounters:  05/31/18 (!) 145/88  04/26/18 120/82  04/24/18 136/82      Accurate as of June 21, 2018  5:17 PM. Always use your most recent med list.        aspirin 325 MG tablet Take 325 mg by mouth every evening.   Blood Glucose Monitor System w/Device Kit Use glucose meter to check blood sugar up to 3 times daily as instructed. One Touch Verio prererred by insurance. 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   carvedilol 6.25 MG tablet Commonly known as:  COREG TAKE 1 TABLET BY MOUTH TWICE DAILY WITH MEALS   clobetasol ointment 0.05 % Commonly known as:  TEMOVATE Apply 1 application topically as needed.   clotrimazole-betamethasone cream Commonly known as:  LOTRISONE Apply 1 application topically as needed.   FISH OIL TRIPLE STRENGTH 1400 MG Caps Take 1,000 mg by  mouth 4 (four) times daily.   fluticasone 50 MCG/ACT nasal spray Commonly known as:  FLONASE Place 2 sprays into both nostrils daily.   furosemide 40 MG tablet Commonly known as:  LASIX Take 1 tablet (40 mg total) by mouth daily.   glimepiride 2 MG tablet Commonly known as:  AMARYL TAKE 1 TABLET BY MOUTH ONCE DAILY BEFORE BREAKFAST   glucose blood test strip One Touch Verio- Use with glucose meter to check blood sugar up to 3 times daily as instructed. Dx: E11.69   hydrocortisone 2.5 % rectal cream Commonly known as:  ANUSOL-HC Place 1 application rectally 2 (two) times daily.   Lancets Misc. 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   Magnesium 250 MG Tabs Take 250 mg by mouth 2 (two) times daily.   omeprazole 20 MG capsule Commonly known as:  PRILOSEC Take 1 capsule (20 mg total) by mouth 2 (two) times daily before a meal.   tiZANidine 4 MG tablet Commonly known as:  ZANAFLEX Take 0.5-1 tablets (2-4 mg total) by mouth every 8 (eight) hours as needed  for muscle spasms.   Vitamin D3 50 MCG (2000 UT) capsule Take 2,000 Units by mouth 2 (two) times daily.      Goals    . "I want to keep my blood pressure in a normal range" (pt-stated)     Current Barriers:  Marland Kitchen Knowledge deficit related to cause of hypotensive event . Knowledge deficit related to treatment of hypotension  Nurse Case Manager Clinical Goal(s): Over the next 7 days patient will verbalize understanding of the plan of care to control her blood pressure within provider recommended parameters.  Interventions:  . Reviewed medication list with patient . Verified strength and directions of  losartan, zanaflex, and carvedilol . Discussed home blood pressure results . Inquired about patient symptoms . Discussed hydration status and water intake . Recommended to increase water intake . Collaborated with PCP . Instructed patient to decrease Losartan to 12.5 mg (1/2 tablet) daily . Advised Check blood pressure daily and PRN and to keep a log. Report out of range readings. . Appt scheduled with FNP for evaluation  . Verbal education given on potential causes of hypotension and precautions to take . Advised patient to move and change positions slowly  Patient Self Care Activities:  . Independent self care . Manages medications and takes them on her own . Checks blood pressure with home machine  *initial goal documentation       Collaborated with Dr Building control surveyor. Patient is taking carvedilol 6.89m BID and losartan 231mfor blood pressure. There has been no recent change in this treatment.    Follow up plan:  Appointment scheduled for tomorrow with MiMonia PouchFNP to rck BP and to r/u UTI as a potential cause   Instructed patient, per Dr DeWarrick Parisianto decrase Losartan to 12.72m28m1/2 tablet) daily  Seek medical care sooner if symptoms worsen   Reminded that we have someone available by phone after hours if needed  I will speak with Karen RobMancel Balemorrow after her appointment  Patient stated understanding and agreement to plan   Karen Potter Nurse Case Manager [place]: [phone]

## 2018-06-22 ENCOUNTER — Ambulatory Visit: Payer: Medicare Other | Admitting: Family Medicine

## 2018-06-22 ENCOUNTER — Ambulatory Visit: Payer: Self-pay | Admitting: *Deleted

## 2018-06-22 DIAGNOSIS — I152 Hypertension secondary to endocrine disorders: Secondary | ICD-10-CM

## 2018-06-22 DIAGNOSIS — E1159 Type 2 diabetes mellitus with other circulatory complications: Secondary | ICD-10-CM

## 2018-06-22 DIAGNOSIS — I10 Essential (primary) hypertension: Principal | ICD-10-CM

## 2018-06-22 NOTE — Chronic Care Management (AMB) (Signed)
  Chronic Care Management   Telephone Note  06/22/2018 Name: Karen Potter MRN: [id number] DOB: [birth year]  Ms Thien called this morning to follow up on yesterdayss conversation regarding symptomatic hypotension and very drouth mouth. She reports that her blood pressure reading was normal this morning, 120/70, and that she feels normal. Denies any symptoms related to hypotension and her drouth my has resolved as well. A few possible causes were explored.  After our conversation, she went back through her weekly pill box and remembered that she had added Zanaflex in with her daily meds so that she would be sure to take it because she wanted to get ahead of her back pain. She normally just takes it as needed, straight out of the bottle. Yesterday she took it with her regular medications and then took it again out of her bottle. This could have potentially caused the hypotensive episode.  Goals Addressed    . "I want to keep my blood pressure in a normal range" (pt-stated)       Current Barriers:  Marland Kitchen Knowledge deficit related to cause of hypotensive event . Knowledge deficit related to treatment of hypotension  Nurse Case Manager Clinical Goal(s): Over the next 2 weeks patient will not experience any hypotensive episodes.  Interventions:   Potential cause of hyptotension discussed  Collaborated with PCP  Advised patient to take medications as directed and to be cautious about accidentally taking more zanaflex than prescribed  Continue to monitor blood pressure and record readings. Report low readings.   May resume Losartan 25mg  daily  Follow up as needed  Patient Self Care Activities:  . Independent self care . Manages medications and takes them on her own . Checks blood pressure with home machine      Appt for evaluation today cancelled per patient's request.  Collaborated with Dr Dettinger. May resume normal dose of Losartan 25mg  but needs to monitor and record BP daily. Report  any low readings Follow up as needed  Follow up plan: The CM team will reach out to the patient again over the next 7 days.  Schedule CCM face to face visit within the next 3 weeks.   Patient states understanding and agreement to plan  Chong Sicilian, RN-BC, BSN Nurse Case Manager [place] Ph: [phone]

## 2018-06-25 DIAGNOSIS — G4733 Obstructive sleep apnea (adult) (pediatric): Secondary | ICD-10-CM | POA: Diagnosis not present

## 2018-07-13 DIAGNOSIS — M47896 Other spondylosis, lumbar region: Secondary | ICD-10-CM | POA: Diagnosis not present

## 2018-07-13 DIAGNOSIS — M545 Low back pain: Secondary | ICD-10-CM | POA: Diagnosis not present

## 2018-07-13 DIAGNOSIS — M4326 Fusion of spine, lumbar region: Secondary | ICD-10-CM | POA: Diagnosis not present

## 2018-07-13 DIAGNOSIS — M5136 Other intervertebral disc degeneration, lumbar region: Secondary | ICD-10-CM | POA: Diagnosis not present

## 2018-07-20 DIAGNOSIS — M5136 Other intervertebral disc degeneration, lumbar region: Secondary | ICD-10-CM | POA: Diagnosis not present

## 2018-07-20 DIAGNOSIS — M4326 Fusion of spine, lumbar region: Secondary | ICD-10-CM | POA: Diagnosis not present

## 2018-07-20 DIAGNOSIS — M545 Low back pain: Secondary | ICD-10-CM | POA: Diagnosis not present

## 2018-07-20 DIAGNOSIS — M5416 Radiculopathy, lumbar region: Secondary | ICD-10-CM | POA: Diagnosis not present

## 2018-07-24 DIAGNOSIS — G4733 Obstructive sleep apnea (adult) (pediatric): Secondary | ICD-10-CM | POA: Diagnosis not present

## 2018-07-25 ENCOUNTER — Ambulatory Visit: Payer: Medicare Other

## 2018-07-28 ENCOUNTER — Telehealth: Payer: Self-pay | Admitting: Family Medicine

## 2018-07-28 NOTE — Telephone Encounter (Signed)
Pt had episode yesterday while at [place] felt disoriented, anxious and was clammy Pt went to car and ate a donut Pt states it really did not help Drove home and was very fatigued Slept for 4 hours Pt states she was RXed Tylenol with Codeine but had not taken this before episode Pt feels better today although she has been in the bed most of the day Pt encouraged to be evaluated at office or ER if she has another episode Pt verbalizes understanding

## 2018-07-30 NOTE — Telephone Encounter (Signed)
I agree that if she is having episodes like this she needs to go to the emergency department.

## 2018-08-04 ENCOUNTER — Other Ambulatory Visit: Payer: Self-pay | Admitting: Family Medicine

## 2018-08-07 ENCOUNTER — Other Ambulatory Visit: Payer: Self-pay

## 2018-08-07 MED ORDER — CLOTRIMAZOLE-BETAMETHASONE 1-0.05 % EX CREA: TOPICAL_CREAM | Freq: Two times a day (BID) | CUTANEOUS | 2 refills | Status: DC

## 2018-08-08 ENCOUNTER — Telehealth: Payer: Medicare Other

## 2018-08-08 ENCOUNTER — Other Ambulatory Visit: Payer: Self-pay | Admitting: *Deleted

## 2018-08-11 ENCOUNTER — Other Ambulatory Visit: Payer: Self-pay | Admitting: Family Medicine

## 2018-08-24 DIAGNOSIS — G4733 Obstructive sleep apnea (adult) (pediatric): Secondary | ICD-10-CM | POA: Diagnosis not present

## 2018-09-04 ENCOUNTER — Ambulatory Visit: Payer: Medicare Other | Admitting: *Deleted

## 2018-09-04 ENCOUNTER — Telehealth: Payer: Self-pay | Admitting: Family Medicine

## 2018-09-04 DIAGNOSIS — Z8673 Personal history of transient ischemic attack (TIA), and cerebral infarction without residual deficits: Secondary | ICD-10-CM

## 2018-09-04 DIAGNOSIS — R4701 Aphasia: Secondary | ICD-10-CM

## 2018-09-04 DIAGNOSIS — E1169 Type 2 diabetes mellitus with other specified complication: Secondary | ICD-10-CM

## 2018-09-04 NOTE — Telephone Encounter (Signed)
PT is wanting to speak to Cyril Mourning about some problems she is having

## 2018-09-04 NOTE — Telephone Encounter (Signed)
Completed.See CCM encounter.   Chong Sicilian, RN-BC, BSN Nurse Case Manager [place] [phone]

## 2018-09-04 NOTE — Patient Instructions (Signed)
Visit Information  Goals Addressed            This Visit's Progress     Patient Stated   . "I don't want to have anymore episodes of my blood sugar dropping" (pt-stated)       Current Barriers:  Marland Kitchen Knowledge Deficits related to medication and diet.   Nurse Case Manager Clinical Goal(s):  Marland Kitchen Over the next 14 days, patient will work with [facility]  to address needs related to hypoglycemic episodes. . Over the next 14 days, patient will demonstrate improved health management independence as evidenced bybetter blood sugar control through adherence to medication directions and diet recommendations.   Interventions:  . Advised patient to have meal/snack in the afternoon between breakfast and supper. . Reviewed medications with patient and discussed Amaryl 2mg . Advised to cut in half with a pill splitter and to take 1 mg each morning with breakfast.  . Collaborated with Dr Dettinger regarding Amaryl dosage and directions.  . Discussed plans with patient for ongoing care management follow up and provided patient with direct contact information for care management team  . Advised patient to check and record her blood sugar daily and PRN over the next week and vary the time of day she checks it.   Patient Self Care Activities:  . Calls pharmacy for medication refills . Performs ADL's independently . Performs IADL's independently . Calls provider office for new concerns or questions  Initial goal documentation      . "I want this 'crick in my neck to go away' " (pt-stated)       Current Barriers:  Marland Kitchen Knowledge Deficits related to cause and treatment of neck stiffness.  Nurse Case Manager Clinical Goal(s):  Marland Kitchen Over the next 4 days, patient will work with RN Case Manager to address needs related to stiffness in neck.  Interventions:  . Advised patient to apply warm, moist heat for throughout the day alternating 20 minutes on and 20 minutes off. . Recommended gentle stretches of ear to shoulder  and hold for 5 seconds . Provided education to patient re: Trapezius muscle location and potential to cause pain/stiffness from scapula area up the lateral neck to the lateral posterior skull.  . Advised patient to report any new or worsening symptoms or no improvement in current symptoms by calling [phone].  Patient Self Care Activities:  . Self administers medications as prescribed . Calls pharmacy for medication refills . Attends church or other social activities . Performs ADL's independently . Performs IADL's independently  Initial goal documentation      . 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 Health and Safety concerns related to Covid 19  Nurse Case Manager Clinical Goal(s):  Marland Kitchen Over the next 30 days, patient will work with [facility] to address needs related to TIA s/s and CVA risk reduction.  . Over the next 30 days, patient will attend all scheduled medical appointments: Referral to Dr Jannifer Franklin (Appt pending)  Interventions:  . Advised patient to seek emergency medical care for any future episodes of acute confusion, aphasia, and fatigue. Marland Kitchen Provided education to patient re: S/S of TIA & CVA.  Nash Dimmer with Dr Dettinger regarding referral to neurologist. Patient has seen Dr Jannifer Franklin for these symptoms in the past.  . Discussed plans with patient for ongoing care management follow up and provided patient with direct  contact information for care management team  Patient Self Care Activities:  . Attends all scheduled provider appointments . Calls pharmacy for medication refills . Performs ADL's independently . Performs IADL's independently . Calls provider office for new concerns or questions  Initial goal documentation         The patient verbalized understanding of instructions provided today and declined a print copy of patient instruction materials.   The  CM team will reach out to the patient again over the next 7 days.   Orders Placed This Encounter  Procedures  . Ambulatory referral to Neurology    Referral Priority:   Routine    Referral Type:   Consultation    Referral Reason:   Specialty Services Required    Referred to Provider:   Kathrynn Ducking, MD    Requested Specialty:   Neurology    Number of Visits Requested:   1    Chong Sicilian, RN-BC, BSN Nurse Case Manager [place] [phone]

## 2018-09-04 NOTE — Chronic Care Management (AMB) (Signed)
Chronic Care Management   Follow Up Note   09/04/2018 Name: Karen Potter MRN: [id number] DOB: 02/25/1941  Referred by: Dettinger, Fransisca Kaufmann, MD Reason for referral : Chronic Care Management (RN Follow Up)   Karen Potter is a 78 y.o. year old female who is a primary care patient of Dettinger, Fransisca Kaufmann, MD. The CCM team was consulted for assistance with chronic disease management and care coordination needs.    Review of patient status, including review of consultants reports, relevant laboratory and other test results, and collaboration with appropriate care team members and the patient's provider was performed as part of comprehensive patient evaluation and provision of chronic care management services.    I spoke with Karen Potter by telephone today. She complains of several episodes of symptomatic hypoglycemia in the 40s. All episodes were between 5:00 and 6:00 pm. One particularly concerning episode happened at [facility]. She states that her vision became distorted and things looked strange; [place] looked [place]. She lost a period of about 45 minutes but assumes she was conscious. She remembers walking out to her car and not being able to recognize it by site but she recognized the name of the [employer] and her key fit. She went across the street and bought a soda and ate two donuts with it. She drove home and eventually returned to normal.That was the worst episode. The others were at home and she was able to check her blood sugar and eat a glucose tablet and something sweet to correct it. She is taking Amaryl 2mg  with breakfast, which is usually between 10:30 and 12:00 daily. She doesn't eat again until around 5:00 or 6:00pm. That likely explains the timing of the episodes.   She also complains of having an event last week where she felt confused and had a very hard time forming coherent thoughts and sentences. She wasn't able to express herself verbally. Her blood sugar was normal,  120.  Her family strongly suggested that she go to the ED but she was concerned about COVID19 so she refused. Instead, she went to bed and slept for about 11 hours. She felt better and was able to process things more clearly when she woke up.  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 Last states that she saw Dr Jannifer Franklin around 2014. No hx of CVA. Denies any current symptoms.   Karen Potter also complains of a "crick in her neck" for several days. States that she has stiffness when turning her head from side to side and she experienced some quick, sharp pains on the left side of her head while coughing a few days ago. No known injury. No OTC Treatments used. Reports no stiffness or pain when touching chin to chest.   Goals Addressed      Patient Stated   . "I don't want to have anymore episodes of my blood sugar dropping" (pt-stated)       Current Barriers:  Marland Kitchen Knowledge Deficits related to medication and diet.   Nurse Case Manager Clinical Goal(s):  Marland Kitchen Over the next 14 days, patient will work with [facility]  to address needs related to hypoglycemic episodes. . Over the next 14 days, patient will demonstrate improved health management independence as evidenced bybetter blood sugar control through adherence to medication directions and diet recommendations.   Interventions:  . Advised patient to have meal/snack in the afternoon between breakfast and supper. . Reviewed medications with patient and discussed  Amaryl 2mg . Advised to cut in half with a pill splitter and to take 1 mg each morning with breakfast.  . Collaborated with Dr Dettinger regarding Amaryl dosage and directions.  . Discussed plans with patient for ongoing care management follow up and provided patient with direct contact information for care management team  . Advised patient to check and record her blood sugar daily and PRN over the next week and vary the time of day she checks it.   Patient Self Care Activities:  .  Calls pharmacy for medication refills . Performs ADL's independently . Performs IADL's independently . Calls provider office for new concerns or questions    . 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 Health and Safety concerns related to Covid 19  Nurse Case Manager Clinical Goal(s):  Marland Kitchen Over the next 30 days, patient will work with [facility] to address needs related to TIA s/s and CVA risk reduction.  . Over the next 30 days, patient will attend all scheduled medical appointments: Referral to Dr Jannifer Franklin (Appt pending)  Interventions:  . Advised patient to seek emergency medical care for any future episodes of acute confusion, aphasia, and fatigue. Marland Kitchen Provided education to patient re: S/S of TIA & CVA.  Nash Dimmer with Dr Dettinger regarding referral to neurologist. Patient has seen Dr Jannifer Franklin for these symptoms in the past.  . Discussed plans with patient for ongoing care management follow up and provided patient with direct contact information for care management team  Patient Self Care Activities:  . Attends all scheduled provider appointments . Calls pharmacy for medication refills . Performs ADL's independently . Performs IADL's independently . Calls provider office for new concerns or questions        . "I want this 'crick in my neck to go away' " (pt-stated)       Current Barriers:  Marland Kitchen Knowledge Deficits related to cause and treatment of neck stiffness.  Nurse Case Manager Clinical Goal(s):  Marland Kitchen Over the next 4 days, patient will work with RN Case Manager to address needs related to stiffness in neck.  Interventions:  . Advised patient to apply warm, moist heat for throughout the day alternating 20 minutes on and 20 minutes off. . Recommended gentle stretches of ear to shoulder and hold for 5 seconds . Provided education to patient re: Trapezius muscle location and potential  to cause pain/stiffness from scapula area up the lateral neck to the lateral posterior skull.  . Advised patient to report any new or worsening symptoms or no improvement in current symptoms by calling [phone].  Patient Self Care Activities:  . Self administers medications as prescribed . Calls pharmacy for medication refills . Attends church or other social activities . Performs ADL's independently . Performs IADL's independently           Orders Placed This Encounter  Procedures  . Ambulatory referral to Neurology    Referral Priority:   Routine    Referral Type:   Consultation    Referral Reason:   Specialty Services Required    Referred to Provider:   Kathrynn Ducking, MD    Requested Specialty:   Neurology    Number of Visits Requested:   1    Plan RN Case Manager will reach out to patient over the next 7 days to follow up Patient will contact RNCM  or PCP at [phone] sooner if necessary.,    Chong Sicilian, RN-BC, BSN Nurse Case Manager [place] [phone]

## 2018-09-07 ENCOUNTER — Other Ambulatory Visit: Payer: Self-pay

## 2018-09-07 ENCOUNTER — Telehealth: Payer: Medicare Other | Admitting: *Deleted

## 2018-09-12 ENCOUNTER — Ambulatory Visit: Payer: Medicare Other | Admitting: *Deleted

## 2018-09-12 DIAGNOSIS — E1159 Type 2 diabetes mellitus with other circulatory complications: Secondary | ICD-10-CM

## 2018-09-12 DIAGNOSIS — I10 Essential (primary) hypertension: Secondary | ICD-10-CM

## 2018-09-12 DIAGNOSIS — E1169 Type 2 diabetes mellitus with other specified complication: Secondary | ICD-10-CM

## 2018-09-12 DIAGNOSIS — I152 Hypertension secondary to endocrine disorders: Secondary | ICD-10-CM

## 2018-09-12 NOTE — Patient Instructions (Addendum)
Visit Information  Goals Addressed            This Visit's Progress     Patient Stated   . "I don't want to have anymore episodes of my blood sugar dropping" (pt-stated)       Current Barriers:  Marland Kitchen Knowledge Deficits related to medication and diet.   Nurse Case Manager Clinical Goal(s):  Marland Kitchen Over the next 14 days, patient will work with [facility]  to address needs related to hypoglycemic episodes. . Over the next 14 days, patient will demonstrate improved health management independence as evidenced bybetter blood sugar control through adherence to medication directions and diet recommendations.   Interventions:  . Advised patient to have meal/snack in the afternoon between breakfast and supper. . Verified with patient that she has been taking 1/2 tab of Amaryl 2mg  each morning . Inquired about hypoglycemic episodes or near syncopal events and patient states that she has not had any since our last telephone call . Questioned if she has been checking her blood sugar and she reports that she has spot checked it some. Most recent reading in the afternoon was 120. Marland Kitchen Reviewed previous A1C results and recommended PCP follow up with labs . Scheduled with Dr Dettinger for later this month .  Patient Self Care Activities:  . Calls pharmacy for medication refills . Performs ADL's independently . Performs IADL's independently . Calls provider office for new concerns or questions  Please see past updates related to this goal by clicking on the "Past Updates" button in the selected goal       . COMPLETED: "I want this 'crick in my neck to go away' " (pt-stated)       Current Barriers:  Marland Kitchen Knowledge Deficits related to cause and treatment of neck stiffness.  Nurse Case Manager Clinical Goal(s):  Marland Kitchen Over the next 4 days, patient will work with RN Case Manager to address needs related to stiffness in neck.  Interventions:  . Advised patient to apply warm, moist heat for throughout the day  alternating 20 minutes on and 20 minutes off. . Recommended gentle stretches of ear to shoulder and hold for 5 seconds . Provided education to patient re: Trapezius muscle location and potential to cause pain/stiffness from scapula area up the lateral neck to the lateral posterior skull.  . Advised patient to report any new or worsening symptoms or no improvement in current symptoms by calling [phone].  Patient Self Care Activities:  . Self administers medications as prescribed . Calls pharmacy for medication refills . Attends church or other social activities . Performs ADL's independently . Performs IADL's independently  Please see past updates related to this goal by clicking on the "Past Updates" button in the selected goal   *Goal Completed    . "I want to keep my blood pressure in a normal range" (pt-stated)       Nurse Case Manager Clinical Goal(s): Over the next 90 days, patient's blood pressure will remain within provider recommended parameters.   Interventions:   Recommended to take medications as prescribed  Check blood pressure and record weekly  Appt scheduled with PCP for f/u at the end of May  Patient Self Care Activities:  . Independent self care . Manages medications and takes them on her own . Checks blood pressure with home machine  Please see past updates related to this goal by clicking on the "Past Updates" button in the selected goal  The patient verbalized understanding of instructions provided today and declined a print copy of patient instruction materials.   The CM team will reach out to the patient again over the next 30 days.  Next PCP appointment scheduled for: 10/04/18 for a face to face visit and labs  Chong Sicilian, RN-BC, BSN Nurse Care Manager [place] [phone]

## 2018-09-12 NOTE — Chronic Care Management (AMB) (Signed)
Chronic Care Management   Telephone Follow Up Note   09/12/2018 Name: Karen Potter MRN: [id number] DOB: 1941/02/23  Referred by: Dettinger, Fransisca Kaufmann, MD Reason for referral : Chronic Care Management (RNCM telephone f/u)   Karen Potter is a 78 y.o. year old female who is a primary care patient of Dettinger, Fransisca Kaufmann, MD. The CCM team was consulted for assistance with chronic disease management and care coordination needs.    Review of patient status, including review of consultants reports, relevant laboratory and other test results, and collaboration with appropriate care team members and the patient's provider was performed as part of comprehensive patient evaluation and provision of chronic care management services.    Goals Addressed      Patient Stated   . "I don't want to have anymore episodes of my blood sugar dropping" (pt-stated)       Current Barriers:  Marland Kitchen Knowledge Deficits related to medication and diet.   Nurse Case Manager Clinical Goal(s):  Marland Kitchen Over the next 14 days, patient will keep appointment with PCP . Over the next 14 days, patient will demonstrate improved health management independence as evidenced bybetter blood sugar control through adherence to medication directions and diet recommendations.   Interventions:  . Advised patient to have meal/snack in the afternoon between breakfast and supper. . Verified with patient that she has been taking 1/2 tab of Amaryl 2mg  each morning . Inquired about hypoglycemic episodes or near syncopal events and patient states that she has not had any since our last telephone call . Questioned if she has been checking her blood sugar and she reports that she has spot checked it some. Most recent reading in the afternoon was 120. Marland Kitchen Reviewed previous A1C results and recommended PCP follow up with labs . Scheduled with Dr Dettinger for later this month .  Patient Self Care Activities:  . Calls pharmacy for medication refills .  Performs ADL's independently . Performs IADL's independently . Calls provider office for new concerns or questions    . COMPLETED: "I want this 'crick in my neck to go away' " (pt-stated)       Current Barriers:  Marland Kitchen Knowledge Deficits related to cause and treatment of neck stiffness.  Nurse Case Manager Clinical Goal(s):  Marland Kitchen Over the next 4 days, patient will work with RN Case Manager to address needs related to stiffness in neck.  Interventions:   . Advised patient to report any new or worsening symptoms or no improvement in current symptoms by calling [phone].  Patient Self Care Activities:  . Self administers medications as prescribed . Calls pharmacy for medication refills . Attends church or other social activities . Performs ADL's independently . Performs IADL's independently    . "I want to keep my blood pressure in a normal range" (pt-stated)       Nurse Case Manager Clinical Goal(s): Over the next 90 days, patient's blood pressure will remain within provider recommended parameters.   Interventions:   Recommended to take medications as prescribed  Check blood pressure and record weekly  Appt scheduled with PCP for f/u at the end of May  Patient Self Care Activities:  . Independent self care . Manages medications and takes them on her own . Checks blood pressure with home machine           Plan The CM team will reach out to the patient again over the next 30 days.  Next PCP appointment scheduled for : 10/04/18 for  a face to face visit and labs  Chong Sicilian, RN-BC, BSN Nurse Care Manager [place] [phone]

## 2018-09-23 DIAGNOSIS — G4733 Obstructive sleep apnea (adult) (pediatric): Secondary | ICD-10-CM | POA: Diagnosis not present

## 2018-10-03 ENCOUNTER — Telehealth: Payer: Self-pay | Admitting: Neurology

## 2018-10-03 ENCOUNTER — Other Ambulatory Visit: Payer: Self-pay

## 2018-10-03 NOTE — Telephone Encounter (Signed)
Due to current COVID 19 pandemic, our office is severely reducing in office visits until further notice, in order to minimize the risk to our patients and healthcare providers.   Called patient to offer an in office visit since she had previously declined a virtual visit. Patient accepted an in office visit with some precautions. Patient understands that upon arrival she will have her temperature checked/will be screened for COVID. Patient is aware that only one person is allowed in office with her if necessary and will also have temp checked.

## 2018-10-04 ENCOUNTER — Encounter: Payer: Self-pay | Admitting: Family Medicine

## 2018-10-04 ENCOUNTER — Ambulatory Visit (INDEPENDENT_AMBULATORY_CARE_PROVIDER_SITE_OTHER): Payer: Medicare Other | Admitting: Family Medicine

## 2018-10-04 ENCOUNTER — Encounter: Payer: Self-pay | Admitting: Neurology

## 2018-10-04 VITALS — BP 116/65 | HR 63 | Temp 97.4°F | Ht 64.5 in | Wt 250.0 lb

## 2018-10-04 DIAGNOSIS — E1169 Type 2 diabetes mellitus with other specified complication: Secondary | ICD-10-CM

## 2018-10-04 DIAGNOSIS — E039 Hypothyroidism, unspecified: Secondary | ICD-10-CM | POA: Diagnosis not present

## 2018-10-04 DIAGNOSIS — K219 Gastro-esophageal reflux disease without esophagitis: Secondary | ICD-10-CM | POA: Diagnosis not present

## 2018-10-04 DIAGNOSIS — E1159 Type 2 diabetes mellitus with other circulatory complications: Secondary | ICD-10-CM | POA: Diagnosis not present

## 2018-10-04 DIAGNOSIS — E785 Hyperlipidemia, unspecified: Secondary | ICD-10-CM

## 2018-10-04 DIAGNOSIS — I10 Essential (primary) hypertension: Secondary | ICD-10-CM

## 2018-10-04 DIAGNOSIS — I152 Hypertension secondary to endocrine disorders: Secondary | ICD-10-CM

## 2018-10-04 LAB — BAYER DCA HB A1C WAIVED: HB A1C (BAYER DCA - WAIVED): 5.9 % (ref <7.0)

## 2018-10-04 MED ORDER — SITAGLIPTIN PHOSPHATE 100 MG PO TABS: 100.0000 mg | ORAL_TABLET | Freq: Every day | ORAL | 5 refills | Status: DC

## 2018-10-04 NOTE — Progress Notes (Signed)
BP 116/65   Pulse 63   Temp (!) 97.4 F (36.3 C) (Oral)   Ht 5' 4.5" (1.638 m)   Wt 250 lb (113.4 kg)   BMI 42.25 kg/m    Subjective:   Patient ID: Karen Potter, female    DOB: August 08, 1940, 78 y.o.   MRN: [id number]  HPI: Karen Potter is a 78 y.o. female presenting on 10/04/2018 for Diabetes (check up of chronic medical conditions)   HPI Type 2 diabetes mellitus Patient comes in today for recheck of his diabetes. Patient has been currently taking glimepiride. Patient is currently on an ACE inhibitor/ARB. Patient has not seen an ophthalmologist this year. Patient denies any issues with their feet.  She has having frequent hypoglycemic episodes down in to the 40s and 50s and 60s over the past couple months, this is on 1/2 tablet over the glimepiride, will try and get her on Januvia instead and will try for prescription assistance if we need it.  Teaching referral is already in place.  We will place a 2-week freestyle libre sensor  Hypothyroidism recheck Patient is coming in for thyroid recheck today as well. They deny any issues with hair changes or heat or cold problems or diarrhea or constipation. They deny any chest pain or palpitations. They are currently on levothyroxine 18mcrograms   Hypertension Patient is currently on losartan and carvedilol, and their blood pressure today is 116/65. Patient denies any lightheadedness or dizziness. Patient denies headaches, blurred vision, chest pains, shortness of breath, or weakness. Denies any side effects from medication and is content with current medication.  Hyperlipidemia Patient is coming in for recheck of his hyperlipidemia. The patient is currently taking no medication currently. They deny any issues with myalgias or history of liver damage from it. They deny any focal numbness or weakness or chest pain.   GERD Patient is currently on omeprazole.  She denies any major symptoms or abdominal pain or belching or burping. She  denies any blood in her stool or lightheadedness or dizziness.   Relevant past medical, surgical, family and social history reviewed and updated as indicated. Interim medical history since our last visit reviewed. Allergies and medications reviewed and updated.  Review of Systems  Constitutional: Negative for chills and fever.  Eyes: Negative for redness and visual disturbance.  Respiratory: Negative for chest tightness and shortness of breath.   Cardiovascular: Negative for chest pain and leg swelling.  Musculoskeletal: Negative for back pain and gait problem.  Skin: Negative for rash.  Neurological: Positive for tremors. Negative for dizziness, weakness, light-headedness and headaches.  Psychiatric/Behavioral: Negative for agitation and behavioral problems.  All other systems reviewed and are negative.   Per HPI unless specifically indicated above   Allergies as of 10/04/2018      Reactions   Codeine Itching   Dilaudid [hydromorphone] Other (See Comments)   Mouth blisters   Other Itching   Most pain meds cause itching.  When has to take pain medication, she has been instructed to take Benadryl   Doxycycline Rash      Medication List       Accurate as of Oct 04, 2018 11:02 AM. If you have any questions, ask your nurse or doctor.        STOP taking these medications   fluticasone 50 MCG/ACT nasal spray Commonly known as:  FLONASE Stopped by:  JWorthy Rancher MD   tiZANidine 4 MG tablet Commonly known as:  Zanaflex Stopped by:  JVonna Kotyk  A Eriel Doyon, MD     TAKE these medications   aspirin 325 MG tablet Take 325 mg by mouth every evening.   Blood Glucose Monitor System w/Device Kit Use glucose meter to check blood sugar up to 3 times daily as instructed. One Touch Verio preferred by insurance. Dx: EE11.69   Calcium 600 600 MG Tabs tablet Generic drug:  calcium carbonate Take 600 mg by mouth 2 (two) times daily with a meal.   carvedilol 6.25 MG tablet Commonly  known as:  COREG TAKE 1 TABLET BY MOUTH TWICE DAILY WITH MEALS   clobetasol ointment 0.05 % Commonly known as:  TEMOVATE Apply 1 application topically as needed.   clotrimazole-betamethasone cream Commonly known as:  LOTRISONE Apply topically 2 (two) times daily.   Fish Oil Triple Strength 1400 MG Caps Take 1,000 mg by mouth 4 (four) times daily.   furosemide 40 MG tablet Commonly known as:  LASIX Take 1 tablet (40 mg total) by mouth daily.   glimepiride 2 MG tablet Commonly known as:  AMARYL TAKE 1 TABLET BY MOUTH ONCE DAILY BEFORE BREAKFAST What changed:  See the new instructions.   glucose blood test strip One Touch Verio- Use with glucose meter to check blood sugar up to 3 times daily as instructed. Dx: E11.69   hydrocortisone 2.5 % rectal cream Commonly known as:  ANUSOL-HC Place 1 application rectally 2 (two) times daily.   Lancets Misc. Kit Use patient & insurance preferred lancet device to test blood glucose up to 3 times daily for Dx: E11.69   levothyroxine 175 MCG tablet Commonly known as:  SYNTHROID TAKE ONE TABLET BY MOUTH ONCE DAILY SUNDAY-THURSDAY  (TAKE  150MCG  ON  FRIDAY)   losartan 25 MG tablet Commonly known as:  Cozaar Take 1 tablet (25 mg total) by mouth daily.   Magnesium 250 MG Tabs Take 250 mg by mouth 2 (two) times daily.   omeprazole 20 MG capsule Commonly known as:  PRILOSEC Take 1 capsule (20 mg total) by mouth 2 (two) times daily before a meal.   Vitamin D3 50 MCG (2000 UT) capsule Take 2,000 Units by mouth 2 (two) times daily.        Objective:   BP 116/65   Pulse 63   Temp (!) 97.4 F (36.3 C) (Oral)   Ht 5' 4.5" (1.638 m)   Wt 250 lb (113.4 kg)   BMI 42.25 kg/m   Wt Readings from Last 3 Encounters:  10/04/18 250 lb (113.4 kg)  05/31/18 251 lb (113.9 kg)  04/26/18 246 lb (111.6 kg)    Physical Exam Vitals signs and nursing note reviewed.  Constitutional:      General: She is not in acute distress.    Appearance:  She is well-developed. She is not diaphoretic.  Eyes:     Conjunctiva/sclera: Conjunctivae normal.  Cardiovascular:     Rate and Rhythm: Normal rate and regular rhythm.     Heart sounds: Normal heart sounds. No murmur.  Pulmonary:     Effort: Pulmonary effort is normal. No respiratory distress.     Breath sounds: Normal breath sounds. No wheezing.  Musculoskeletal: Normal range of motion.        General: No tenderness.  Skin:    General: Skin is warm and dry.     Findings: No rash.  Neurological:     Mental Status: She is alert and oriented to person, place, and time.     Coordination: Coordination normal.  Psychiatric:  Behavior: Behavior normal.       Assessment & Plan:   Problem List Items Addressed This Visit      Cardiovascular and Mediastinum   Hypertension associated with diabetes ([place])   Relevant Medications   sitaGLIPtin (JANUVIA) 100 MG tablet   carvedilol (COREG) 6.25 MG tablet   Other Relevant Orders   CBC with Differential/Platelet (Completed)     Digestive   GERD (gastroesophageal reflux disease) (Chronic)   Relevant Orders   CBC with Differential/Platelet (Completed)     Endocrine   Hyperlipidemia associated with type 2 diabetes mellitus (H[facility])   Relevant Medications   sitaGLIPtin (JANUVIA) 100 MG tablet   Other Relevant Orders   Lipid panel (Completed)   Hypothyroid   Relevant Medications   carvedilol (COREG) 6.25 MG tablet   Other Relevant Orders   TSH (Completed)   Type 2 diabetes mellitus with other specified complication ([place]) - Primary   Relevant Medications   sitaGLIPtin (JANUVIA) 100 MG tablet   Other Relevant Orders   Bayer DCA Hb A1c Waived (Completed)   CMP14+EGFR (Completed)      We are going to try and transition from glimepiride to Januvia if it is affordable  Concern is she is having a lot more hypoglycemic episodes than she is feeling and we will see if the freestyle libre monitor can give [place] better numbers Follow up  plan: Return in about 3 months (around 01/04/2019), or if symptoms worsen or fail to improve, for Diabetes and thyroid and hypertension.  Counseling provided for all of the vaccine components Orders Placed This Encounter  Procedures  . Bayer DCA Hb A1c Waived  . CBC with Differential/Platelet  . CMP14+EGFR  . Lipid panel  . TSH    Caryl Pina, MD [facility] 10/04/2018, 11:02 AM

## 2018-10-05 ENCOUNTER — Other Ambulatory Visit: Payer: Self-pay

## 2018-10-05 ENCOUNTER — Ambulatory Visit: Payer: Medicare Other | Admitting: *Deleted

## 2018-10-05 DIAGNOSIS — I152 Hypertension secondary to endocrine disorders: Secondary | ICD-10-CM

## 2018-10-05 DIAGNOSIS — E1169 Type 2 diabetes mellitus with other specified complication: Secondary | ICD-10-CM

## 2018-10-05 DIAGNOSIS — E1159 Type 2 diabetes mellitus with other circulatory complications: Secondary | ICD-10-CM

## 2018-10-05 LAB — CBC WITH DIFFERENTIAL/PLATELET
Basophils Absolute: 0.1 10*3/uL (ref 0.0–0.2)
Basos: 1 %
EOS (ABSOLUTE): 0.2 10*3/uL (ref 0.0–0.4)
Eos: 3 %
Hematocrit: 40.4 % (ref 34.0–46.6)
Hemoglobin: 13.6 g/dL (ref 11.1–15.9)
Immature Grans (Abs): 0 10*3/uL (ref 0.0–0.1)
Immature Granulocytes: 0 %
Lymphocytes Absolute: 2.9 10*3/uL (ref 0.7–3.1)
Lymphs: 35 %
MCH: 30 pg (ref 26.6–33.0)
MCHC: 33.7 g/dL (ref 31.5–35.7)
MCV: 89 fL (ref 79–97)
Monocytes Absolute: 1 10*3/uL — ABNORMAL HIGH (ref 0.1–0.9)
Monocytes: 11 %
Neutrophils Absolute: 4.2 10*3/uL (ref 1.4–7.0)
Neutrophils: 50 %
Platelets: 274 10*3/uL (ref 150–450)
RBC: 4.54 x10E6/uL (ref 3.77–5.28)
RDW: 13.7 % (ref 11.7–15.4)
WBC: 8.3 10*3/uL (ref 3.4–10.8)

## 2018-10-05 LAB — CMP14+EGFR
ALT: 24 IU/L (ref 0–32)
AST: 19 IU/L (ref 0–40)
Albumin/Globulin Ratio: 1.4 (ref 1.2–2.2)
Albumin: 4.2 g/dL (ref 3.7–4.7)
Alkaline Phosphatase: 78 IU/L (ref 39–117)
BUN/Creatinine Ratio: 22 (ref 12–28)
BUN: 19 mg/dL (ref 8–27)
Bilirubin Total: 0.5 mg/dL (ref 0.0–1.2)
CO2: 25 mmol/L (ref 20–29)
Calcium: 9.7 mg/dL (ref 8.7–10.3)
Chloride: 104 mmol/L (ref 96–106)
Creatinine, Ser: 0.88 mg/dL (ref 0.57–1.00)
GFR calc Af Amer: 74 mL/min/{1.73_m2} (ref >59)
GFR calc non Af Amer: 64 mL/min/{1.73_m2} (ref >59)
Globulin, Total: 3 g/dL (ref 1.5–4.5)
Glucose: 115 mg/dL — ABNORMAL HIGH (ref 65–99)
Potassium: 4.5 mmol/L (ref 3.5–5.2)
Sodium: 147 mmol/L — ABNORMAL HIGH (ref 134–144)
Total Protein: 7.2 g/dL (ref 6.0–8.5)

## 2018-10-05 LAB — LIPID PANEL
Chol/HDL Ratio: 4.6 ratio — ABNORMAL HIGH (ref 0.0–4.4)
Cholesterol, Total: 155 mg/dL (ref 100–199)
HDL: 34 mg/dL — ABNORMAL LOW (ref >39)
LDL Calculated: 77 mg/dL (ref 0–99)
Triglycerides: 221 mg/dL — ABNORMAL HIGH (ref 0–149)
VLDL Cholesterol Cal: 44 mg/dL — ABNORMAL HIGH (ref 5–40)

## 2018-10-05 LAB — TSH: TSH: 1.05 u[IU]/mL (ref 0.450–4.500)

## 2018-10-05 NOTE — Chronic Care Management (AMB) (Signed)
  Chronic Care Management   Note  10/05/2018 Name: Karen Potter MRN: [id number] DOB: [birth year]  Ms Karen Potter had a visit with Dr Dettinger yesterday. She supplied home blood pressure readings for review and contrary to our last conversation, wshe had many readings below 60 even after cutting the Amaryl dose in half. He recommended wearing the [facility] for 2 weeks to observe her blood sugar pattern. I agree that that would be very useful information.  Dr Dettinger d/c Amaryl and started her on Januvia. There are no samples at this time and I'm not sure how well her insurance will cover it. She will most likely qualify for prescription assistance through [employer]. I placed a referral to the [facility] team and received a response back that they will engage the patient for prescription assistance. She will most likely qualify for full assistance.    Follow up plan: The CM team will reach out to the patient again over the next 7 days.   Chong Sicilian, RN-BC, BSN Nurse Care Manager [facility] [phone]

## 2018-10-09 ENCOUNTER — Encounter: Payer: Self-pay | Admitting: Neurology

## 2018-10-09 ENCOUNTER — Other Ambulatory Visit: Payer: Self-pay

## 2018-10-09 ENCOUNTER — Ambulatory Visit: Payer: Medicare Other | Admitting: Neurology

## 2018-10-09 VITALS — BP 115/79 | HR 79 | Temp 98.2°F | Ht 64.0 in | Wt 251.0 lb

## 2018-10-09 DIAGNOSIS — Z9989 Dependence on other enabling machines and devices: Secondary | ICD-10-CM | POA: Diagnosis not present

## 2018-10-09 DIAGNOSIS — Z8673 Personal history of transient ischemic attack (TIA), and cerebral infarction without residual deficits: Secondary | ICD-10-CM

## 2018-10-09 DIAGNOSIS — G4733 Obstructive sleep apnea (adult) (pediatric): Secondary | ICD-10-CM | POA: Diagnosis not present

## 2018-10-09 DIAGNOSIS — R29818 Other symptoms and signs involving the nervous system: Secondary | ICD-10-CM

## 2018-10-09 DIAGNOSIS — Z6841 Body Mass Index (BMI) 40.0 and over, adult: Secondary | ICD-10-CM

## 2018-10-09 NOTE — Progress Notes (Signed)
Subjective:    Patient ID: Karen Potter is a 78 y.o. female.  HPI     Interim history:  Karen Potter is a 78 year old right-handed woman with an underlying complex medical history of type 2 diabetes, right bundle branch block, history of polio, osteoarthritis, hypothyroidism, hypertension, hyperlipidemia, history of hepatitis B, history of TIA, reflux disease, seasonal allergies, peripheral edema, type 2 diabetes, and morbid obesity with BMI of over 40, who presents for a new problem visit of transient neurological symptoms.  She is referred by her primary care physician, Karen Potter.  She has a prior history of suspected TIA, was hospitalized in 2013 with an episode of a aphasia/confusion and had full work-up at the time.  She had seen Karen Potter in the past.  She was at one point suspected to have transient global amnesia as I understand.  Today, 10/09/2018: She reports that on 09/02/2018 she had an episode of not being able to get her words out.  She was not making any sense, she could comprehend what her husband was saying at the time.  She did not end up seeking immediate medical attention as she was afraid to go to the emergency room secondary to the COVID-19 pandemic.  She did sleep and use her CPAP machine at the time, she felt better after waking up.  She had no loss of time as an lack of memory, she had no one-sided weakness or numbness or tingling or droopy face.  She had no twitching or frank staring spell.  She had no actual headache.  As far as vascular risk factors, she does have obesity, a prior suspicion for TIA, type 2 diabetes, hypertension, hyperlipidemia and sleep apnea.  She had blood work recently with her primary care physician which I reviewed.  She was advised to stop her glimepiride.  She has had some lower blood sugar values but typically when she has lows she has associated sweating and jitteriness which she did not have that day.  She admits that she does not drink water  very much.  She estimates that she drinks 1 cup of water and 3 bottles of regular Coca-Cola.  She was told years ago that she should not have any sweeteners and also not use any drinks and cans to avoid aluminum exposure.  Her blood work from 10/04/2018 showed an A1c of 5.9, LDL of 77, mildly elevated triglycerides and she was advised to add fish oil.  She is on an adult aspirin. She had brain imaging studies in 2013 which I reviewed.   I last saw her on 05/31/2018 for sleep apnea follow-up, at which time she was fully compliant with her CPAP machine.  We talked about her sleep study results.  She had a recent interim hospitalization for salmonella enterocolitis and sepsis, complicated by dehydration and acute kidney injury.  The patient's allergies, current medications, family history, past medical history, past social history, past surgical history and problem list were reviewed and updated as appropriate.    Previously:   I first met her on 01/10/2018 at the request of her primary care physician, at which time she reported a long-standing history of sleep apnea and being on CPAP therapy. She needed reevaluation and new equipment. She was invited for sleep study testing. She had a baseline sleep study, followed by a CPAP titration study. I went over her test results with her in detail today. Baseline sleep study from 01/16/2018 showed a sleep efficiency markedly reduced at 40.6%, sleep latency  was markedly delayed at 267 minutes, she had clear difficulty attaining sleep without using CPAP. She had an increased percentage of light stage sleep, REM sleep was absent. Total AHI was 30.3 per hour, average oxygen saturation 93%, nadir was 86% during non-REM sleep. She was invited for a CPAP titration study. She had this on 02/20/2018, sleep efficiency was 53.7%, sleep latency again delayed at 133 minutes. REM latency was 138.5 minutes, REM percentage reduced at 4.5%. She was titrated via full face mask as she is  used to using a fullface mask. CPAP was titrated to a pressure of 10 cm. On the final pressure her AHI was 2.6 per hour with supine non-REM sleep achieved an O2 nadir of 87%. Based on her test results I suggested a home treatment pressure of 12 cm.   I reviewed her CPAP compliance data from 04/30/2018 through 05/29/2018, which is a total of 30 days, during which time she used her CPAP every night with percent used days greater than 4 hours at 100%, indicating superb compliance with an average usage of 7 hours and 10 minutes, residual AHI at goal at 1.1 per hour, leak on the high side with the 95th percentile at 24.2 L/m on a pressure of 12 cm with EPR of 1.   01/10/2018: (She) was previously diagnosed with obstructive sleep apnea and placed on CPAP therapy. She had sleep study testing in June 2003 with severe obstructive sleep apnea diagnosed with a baseline AHI of 92 per hour. She has been on CPAP. A compliance download was not possible today as she has an older machine. I reviewed your office note from 11/07/2017. Her Epworth sleepiness score is 15 out of 24, fatigue score is 60 out of 63. She complains of mouth dryness. 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 They have 4 children, 2 are adopted. She is a nonsmoker and does not typically utilize alcohol, she drinks caffeine in the form of soda, 2 to 3 bottles per day on average. She has had SOB with minimal exertion since April. She has been on antibiotics and has had a steroid shot. She has had chest x-ray. Her chest x-ray from 11/07/2017 showed mildly enlarged cardiac silhouette with mild bibasilar atelectasis. She reports being fully compliant with CPAP therapy, pressure is at 16, she does not like the ramp feature. She uses humidification and nasal mask which seems to fit well. She would not be opposed to retesting for sleep apnea. She does not feel that her shortness of breath during the day is related to  her sleep apnea as she is fully compliant with treatment. Nevertheless, her Epworth sleepiness score is elevated. She also reports not always making enough time for sleep. She goes to bed around 2 and rise time varies. Sometimes she is up by 6 if she needs to and sometimes she may sleep until 10. She has nocturia about once per average night and denies morning headaches. She does not have a close family member with sleep apnea but some distant cousins. She had exposure to secondhand smoke when she was a child and also before her husband quit smoking. She had a tonsillectomy as a child. Her weight at the time of her sleep study in 2003 was 220, current weight of 258.  Her Past Medical History Is Significant For: Past Medical History:  Diagnosis Date  . Duodenal ulcer without hemorrhage or perforation    08-29-2015 per EGD  reort  . Fatty liver   . Gastric ulcer without hemorrhage or perforation    08-29-2015 per EGD report  . Gastritis    per EGD 08-29-2015  . GERD (gastroesophageal reflux disease)   . Hearing loss    no hearing aids  . 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   age 61--  pt states was quarantined   no treatment ;  per pt no symptoms or issues since  . History of kidney stones   . History of TIA (transient ischemic attack)    03-23-2014  w/ episode temporay amnesia  . Hyperlipidemia   . Hypertension   . Hypothyroidism   . Iron deficiency anemia   . LAFB (left anterior fascicular block)   . OA (osteoarthritis)   . OSA on CPAP    severe per study 2003  . Polio    age 70  -- residual left leg with repair surgery x3  . 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 RBBB (right bundle branch block)   . Seasonal allergies   . Type 2 diabetes mellitus ([place])   . Vitamin D deficiency     Her Past Surgical History Is Significant For: Past Surgical History:  Procedure  Laterality Date  . CARPAL TUNNEL RELEASE Right 05-10-2007  . CHOLECYSTECTOMY OPEN  1985   and Appendectomy  . CYSTO/  RIGHT URETEROSCOPIC STONE EXTRACTON/  STENT PLACEMENT  06/ 2016   in [place]  . CYSTOSCOPY/RETROGRADE/URETEROSCOPY/STONE EXTRACTION WITH BASKET Right 11/17/2015   Procedure: CYSTOSCOPY/RETROGRADE PYELOGRAM RIGHT/DIAGNOSTIC RIGHT URETEROSCOPY/LASER RENAL CALCIFICATION/RIGHT STENT PLACEMENT;  Surgeon: Cleon Gustin, MD;  Location: [facility];  Service: Urology;  Laterality: Right;  . ESOPHAGOGASTRODUODENOSCOPY N/A 08/29/2015   Procedure: ESOPHAGOGASTRODUODENOSCOPY (EGD);  Surgeon: Danie Binder, MD;  Location[facility];  Service: Endoscopy;  Laterality: N/A;  . HOLMIUM LASER APPLICATION Right 2/54/2706   Procedure: HOLMIUM LASER APPLICATION;  Surgeon: Cleon Gustin, MD;  [facility];  Service: Urology;  Laterality: Right;  . KNEE ARTHROSCOPY Right 1999  . LEFT HEEL REPAIR SURGERY  1954;  1985;  1995   polio  . LUMBAR SPINE SURGERY  09-01-2010   L4 -L5 fusion,  L3 laminectomy,  L5 - S1 foraminotomy  . OVARIAN CYST SURGERY Right 1966  . PARATHYROIDECTOMY N/A 05/06/2015   Procedure: PARATHYROIDECTOMY;  Surgeon: Armandina Gemma, MD;  Location: [facility];  Service: General;  Laterality: N/A;   left superior  . TONSILLECTOMY  as child  . TOTAL ABDOMINAL HYSTERECTOMY W/ BILATERAL SALPINGOOPHORECTOMY  1984  . TOTAL KNEE ARTHROPLASTY Right 04-08-2008  . TRANSTHORACIC ECHOCARDIOGRAM  04-07-2012   grade 1 diastolic function,  ef 23-76%/  mild AV calcification without stenosis/  trivial MR    Her Family History Is Significant For: Family History  Problem Relation Age of Onset  . Heart failure Mother        No details.  No MI  . Cancer Father        Lung  . Cancer Brother        Lung    Her Social History Is Significant For: Social History   Socioeconomic History  . Marital status: Married    Spouse name: Not on file  .  Number of children: Not on file  . Years of education: Not on file  . Highest education level:  Not on file  Occupational History  . Not on file  Social Needs  . Financial resource strain: Not on file  . Food insecurity:    Worry: Not on file    Inability: Not on file  . Transportation needs:    Medical: Not on file    Non-medical: Not on file  Tobacco Use  . Smoking status: Never Smoker  . Smokeless tobacco: Never Used  Substance and Sexual Activity  . Alcohol use: No  . Drug use: No  . Sexual activity: Not on file  Lifestyle  . Physical activity:    Days per week: Not on file    Minutes per session: Not on file  . Stress: Not on file  Relationships  . Social connections:    Talks on phone: Not on file    Gets together: Not on file    Attends religious service: Not on file    Active member of club or organization: Not on file    Attends meetings of clubs or organizations: Not on file    Relationship status: Not on file  Other Topics Concern  . Not on file  Social History Narrative   Lives with husband.  Four children.      Her Allergies Are:  Allergies  Allergen Reactions  . Codeine Itching  . Dilaudid [Hydromorphone] Other (See Comments)    Mouth blisters  . Other Itching    Most pain meds cause itching.  When has to take pain medication, she has been instructed to take Benadryl  . Doxycycline Rash  :   Her Current Medications Are:  Outpatient Encounter Medications as of 10/09/2018  Medication Sig  . aspirin 325 MG tablet Take 325 mg by mouth every evening.   . Blood Glucose Monitoring Suppl (BLOOD GLUCOSE MONITOR SYSTEM) w/Device KIT Use glucose meter to check blood sugar up to 3 times daily as instructed. One Touch Verio preferred by insurance. Dx: EE11.69  . calcium carbonate (CALCIUM 600) 600 MG TABS tablet Take 600 mg by mouth 2 (two) times daily with a meal.  . carvedilol (COREG) 6.25 MG tablet TAKE 1 TABLET BY MOUTH TWICE DAILY WITH MEALS  .  Cholecalciferol (VITAMIN D3) 2000 UNITS capsule Take 2,000 Units by mouth 2 (two) times daily.   . clobetasol ointment (TEMOVATE) 6.29 % Apply 1 application topically as needed.  . clotrimazole-betamethasone (LOTRISONE) cream Apply topically 2 (two) times daily.  . furosemide (LASIX) 40 MG tablet Take 1 tablet (40 mg total) by mouth daily.  Marland Kitchen glimepiride (AMARYL) 2 MG tablet TAKE 1 TABLET BY MOUTH ONCE DAILY BEFORE BREAKFAST (Patient taking differently: Take 1 mg by mouth daily with breakfast. )  . glucose blood test strip One Touch Verio- Use with glucose meter to check blood sugar up to 3 times daily as instructed. Dx: E11.69  . hydrocortisone (ANUSOL-HC) 2.5 % rectal cream Place 1 application rectally 2 (two) times daily.  . Lancets Misc. KIT Use patient & insurance preferred lancet device to test blood glucose up to 3 times daily for Dx: E11.69  . levothyroxine (SYNTHROID, LEVOTHROID) 175 MCG tablet TAKE ONE TABLET BY MOUTH ONCE DAILY SUNDAY-THURSDAY  (TAKE  150MCG  ON  FRIDAY)  . losartan (COZAAR) 25 MG tablet Take 1 tablet (25 mg total) by mouth daily.  . Magnesium 250 MG TABS Take 250 mg by mouth 2 (two) times daily.  . Omega-3 Fatty Acids (FISH OIL TRIPLE STRENGTH) 1400 MG CAPS Take 1,000 mg by mouth 4 (four)  times daily.   Marland Kitchen omeprazole (PRILOSEC) 20 MG capsule Take 1 capsule (20 mg total) by mouth 2 (two) times daily before a meal.  . sitaGLIPtin (JANUVIA) 100 MG tablet Take 1 tablet (100 mg total) by mouth daily.   No facility-administered encounter medications on file as of 10/09/2018.   :  Review of Systems:  Out of a complete 14 point review of systems, all are reviewed and negative with the exception of these symptoms as listed below: Review of Systems  Neurological:       Pt presents today to discuss her difficulty speaking. She had a spell on 09/02/2018 where she couldn't get her words out.    Objective:  Neurological Exam  Physical Exam Physical Examination:   Vitals:    10/09/18 0935  BP: 115/79  Pulse: 79  Temp: 98.2 F (36.8 C)    General Examination: The patient is a very pleasant 78 y.o. female in no acute distress. She appears well-developed and well-nourished and well groomed.   HEENT:Normocephalic, atraumatic, pupils are equal, round and reactive to light and accommodation. corrective eyeglasses in place. Extraocular tracking is unremarkable. Hearing is grossly intact. Face is symmetric with normal facial animation, speech is without dysarthria. Normal Facial sensation to Light touch and temperature. She has no lip, neck or jaw tremor, airway examination reveals moderate mouth dryness and moderate airway crowding. Tongue protrudes centrally and palate elevates symmetrically. No carotid bruits.  Chest:Clear to auscultation without wheezing, rhonchi or crackles noted.  Heart:S1+S2+0, regular and normal without murmurs, rubs or gallops noted.   Abdomen:Soft, non-tender and non-distended with normal bowel sounds appreciated on auscultation.  Extremities:There is no obvious change. Left distal leg is smaller in caliber (had polio as a child on the left side).  Skin: Warm and dry without trophic changes noted.  Musculoskeletal: exam reveals no obvious joint deformities, tenderness or joint swelling or erythema.   Neurologically:  Mental status: The patient is awake, alert and oriented in all 4 spheres.Herimmediate and remote memory, attention, language skills and fund of knowledge are appropriate. There is no evidence of aphasia, agnosia, apraxia or anomia. Speech is clear with normal prosody and enunciation. Thought process is linear. Mood is normaland affect is normal.  Cranial nerves II - XII are as described above under HEENT exam. Motor exam: Normal bulk(except L distal leg), strength and tone is noted. There is no drift, tremor or rebound. Romberg is negative, except slight initial sway. Fine motor skills and  coordination:grosslyintact.  Cerebellar testing: No dysmetria or intention tremor on finger to nose, Heel-to-shin exam is somewhat restricted secondary to body habitus but she is able to lift up heel toe mid to lower shin and has no dysmetria.  Sensory exam: intact to light touch.  Gait, station and balance:Shestands with mild difficulty and pushes herself up. She stands naturally slightly wide-based. She walks without a limp.  Assessmentand Plan:  In summary,Shauntelle P Robertsis a very pleasant 63 year oldfemalewith an underlying complex medical history of type 2 diabetes, right bundle branch block, history of polio, osteoarthritis, hypothyroidism, hypertension, hyperlipidemia, history of hepatitis B, history of TIA, reflux disease, seasonal allergies, peripheral edema, type 2 diabetes, and morbid obesity with BMI of over 40, whopresents for evaluation of a spell of not being able to get her words out, expressive difficulty with her language, altogether lasted several hours, she felt better after sleeping. She had no other neurological accompaniments.  She is compliant with CPAP and commended for it.  She had a  baseline sleep study in September 2019 and a CPAP titration study in October 2019. She had brain imaging in 2013, including br MRI w/wo contrast, MRA head/neck, all reassuring. She has seen cardiology and was told at one point that she may need a pacemaker eventually.  She does not have any scheduled appointment with cardiology routinely. She feels at baseline.   Differential diagnosis for her expressive dysphasia spell would include TIA, migraine variant, hypoglycemic episode, dehydration, transient global amnesia less likely secondary to not endorsing any actual loss of time.  Seizure unlikely secondary to no stereotypic motor spell, no actual staring spell, no twitching or automatisms, also no convulsion of course.   Her vascular risk factors include DM, HTN, HLP, obesity, OSA. All  risk factors are under good control medically and via CPAP. She does need to work on weight loss and is aware. She Is not hydrating very well with water.  She is encouraged to increase her water intake and decrease her regular soda intake. She Is advised to proceed with an MRI without contrast.  We will call her with her test results. She is advised to follow-up with her primary care physician for her medical risk factor management.  She is advised to keep her follow-up appointment scheduled for January next year with the nurse practitioner. We will call her with the MRI results in the interim.  I answered all her questions today and she was in agreement. I spent 25 minutes in total face-to-face time with the patient, more than 50% of which was spent in counseling and coordination of care, reviewing test results, reviewing medication and discussing or reviewing the diagnosis of transient neurological Sx, OSA, its prognosis and treatment options. Pertinent laboratory and imaging test results that were available during this visit with the patient were reviewed by me and considered in my medical decision making (see chart for details).

## 2018-10-09 NOTE — Patient Outreach (Signed)
[facility]) Care Management  10/09/2018  Karen Potter 10-16-1940 [id number]  TELEPHONE SCREENING Referral date: 10/04/18 Referral source: primary MD  Referral reason: medication assistance.  Insurance: [employer]  Attempt #1  Telephone call to patient regarding primary MD referral. Unable to reach patient. HIPAA compliant voice message left with call back phone number.   PLAN: RNCm will attempt 2nd telephone call to patient within 4 business days.  RNCM will send outreach letter to patient to attempt contact.   Quinn Plowman RN,BSN,CCM [facility] Telephonic  [phone]

## 2018-10-09 NOTE — Patient Instructions (Signed)
You may have had a TIA or ministroke. Other possibilities for the spell you had in April would include a migraine variant, low blood sugar related symptoms, dehydration related symptoms. You do have risk factors, but they are well controlled; please Continue to work on weight loss and continue to be fully compliant with her CPAP machine.  Please try to hydrate better with water as you estimate you drink only 1 cup of water per day, try to reduce your soda intake. We will do a brain scan, called MRI and call you with the test results. We will have to schedule you for this on a separate date. This test requires authorization from your insurance, and we will take care of the insurance process. We can keep your appointment in January.

## 2018-10-10 ENCOUNTER — Telehealth: Payer: Self-pay | Admitting: Neurology

## 2018-10-10 MED ORDER — CARVEDILOL 6.25 MG PO TABS: 6.2500 mg | ORAL_TABLET | Freq: Two times a day (BID) | ORAL | 1 refills | Status: DC

## 2018-10-10 NOTE — Telephone Encounter (Signed)
[employer] Medicare no auth. Patient is scheduled at [facility] for 10/19/18 arrival time is 9:30 AM. Patient is aware of time and day.she also has their number of [phone] in case she needed to r/s for any reason. She is also aware to wear a mask.

## 2018-10-11 ENCOUNTER — Other Ambulatory Visit: Payer: Self-pay

## 2018-10-11 NOTE — Patient Outreach (Signed)
[facility]) Care Management  10/11/2018  Karen Potter 1941-03-19 [id number]  TELEPHONE SCREENING Referral date: 10/04/18 Referral source: primary MD  Referral reason: medication assistance.  Insurance: [employer]  Telephone call to patient regarding primary MD referral. HIPAA verified with patient. RNCM introduced herself and explained reason for call.  RNCM discussed and offered [facility] care management services.  Patient states she does not need medication assistance. She states her issue having a lot of low blood sugar readings since march 2020.  She states her doctor felt her diabetic medication was causing the lows and discontinued her diabetic medications. Patient states her most recent A1c WAS 5.9.  Patient states she has seen her lows in the 40's.  Patient states she has had diabetes for years.  She state she had a similar experience a few years ago where she started having a lot of low blood sugars and her doctor discontinued her diabetic medication. She states she her blood sugars eventually started going back up and she had to resume taking the medication.   Patient states she has a monitor on her arm that she has to wear for 2 weeks that checks her blood sugars. Patient states she is also in a program with her doctors office in which she calls a particular nurse when she has questions or concerns regarding her diabetes. RNCM discussed importance of patient keeping glucose tablets or candy with her at all times in the event she experiences low blood sugar symptoms. Patient verbalized understanding.   Patient declined [facility] care management services at this time. Patient verbally agreed to received the [facility] care management brochure. She states she will look over the brochure once she receives it then determine whether or not she needs the services. RNCM verified mailing address with patient.   PLAN: RNCM will close due to patients refusal of services.  RNCm will mail  patient [facility] care management brochure/ magnet RNCM will send closure letter to patients primary MD   Quinn Plowman RN,BSN,CCM [facility] Telephonic  [phone]

## 2018-10-12 ENCOUNTER — Ambulatory Visit (INDEPENDENT_AMBULATORY_CARE_PROVIDER_SITE_OTHER): Payer: Medicare Other | Admitting: *Deleted

## 2018-10-12 ENCOUNTER — Other Ambulatory Visit: Payer: Self-pay

## 2018-10-12 DIAGNOSIS — Z0000 Encounter for general adult medical examination without abnormal findings: Secondary | ICD-10-CM | POA: Diagnosis not present

## 2018-10-12 NOTE — Progress Notes (Signed)
MEDICARE ANNUAL WELLNESS VISIT  10/12/2018  Telephone Visit Disclaimer This Medicare AWV was conducted by telephone due to national recommendations for restrictions regarding the COVID-19 Pandemic (e.g. social distancing).  I verified, using two identifiers, that I am speaking with Karen Potter or their authorized healthcare agent. I discussed the limitations, risks, security, and privacy concerns of performing an evaluation and management service by telephone and the potential availability of an in-person appointment in the future. The patient expressed understanding and agreed to proceed.   Subjective:  Karen Potter is a 78 y.o. female patient of Karen Potter, Karen Kaufmann, MD who had a Medicare Annual Wellness Visit today via telephone. [name] is Retired and lives with their spouse. she has 4 children. she reports that she is socially active and does interact with friends/family regularly. she is not physically active and enjoys gardening.  Patient Care Team: Karen Potter, Karen Kaufmann, MD as PCP - General (Family Medicine) Karen Essex, MD as Consulting Physician (Gastroenterology) Karen Potter, Karen Furbish, MD as Consulting Physician (Urology) Karen China, RN as Case Manager  Advanced Directives 10/12/2018 02/23/2018 08/29/2017 11/17/2015 08/29/2015 08/29/2015 05/06/2015  Does Patient Have a Medical Advance Directive? Yes No Yes Yes No;Yes No Yes  Type of Advance Directive Living will - Living will Living will Living will - Living will  Does patient want to make changes to medical advance directive? - - No - Patient declined - - - No - Patient declined  Copy of [place] in Chart? - - - No - copy requested No - copy requested - No - copy requested  Would patient like information on creating a medical advance directive? - No - Patient declined - - No - patient declined information - -    Hospital Utilization Over the Past 12 Months: # of hospitalizations or ER visits: 0 # of  surgeries: 0  Review of Systems    Patient reports that her overall health is worse compared to last year.  Patient Reported Readings (BP, Pulse, CBG, Weight, etc) none  Review of Systems: No complaints  All other systems negative.  Pain Assessment Pain : No/denies pain     Current Medications & Allergies (verified) Allergies as of 10/12/2018      Reactions   Codeine Itching   Dilaudid [hydromorphone] Other (See Comments)   Mouth blisters   Other Itching   Most pain meds cause itching.  When has to take pain medication, she has been instructed to take Benadryl   Doxycycline Rash      Medication List       Accurate as of October 12, 2018  2:49 PM. If you have any questions, ask your nurse or doctor.        STOP taking these medications   glimepiride 2 MG tablet Commonly known as:  AMARYL   sitaGLIPtin 100 MG tablet Commonly known as:  Januvia     TAKE these medications   aspirin 325 MG tablet Take 325 mg by mouth every evening.   Blood Glucose Monitor System w/Device Kit Use glucose meter to check blood sugar up to 3 times daily as instructed. One Touch Verio preferred by insurance. Dx: EE11.69   Calcium 600 600 MG Tabs tablet Generic drug:  calcium carbonate Take 600 mg by mouth 2 (two) times daily with a meal.   carvedilol 6.25 MG tablet Commonly known as:  COREG Take 1 tablet (6.25 mg total) by mouth 2 (two) times daily with a meal.  clobetasol ointment 0.05 % Commonly known as:  TEMOVATE Apply 1 application topically as needed.   clotrimazole-betamethasone cream Commonly known as:  LOTRISONE Apply topically 2 (two) times daily.   Fish Oil Triple Strength 1400 MG Caps Take 1,000 mg by mouth 4 (four) times daily.   furosemide 40 MG tablet Commonly known as:  LASIX Take 1 tablet (40 mg total) by mouth daily.   glucose blood test strip One Touch Verio- Use with glucose meter to check blood sugar up to 3 times daily as instructed. Dx: E11.69    hydrocortisone 2.5 % rectal cream Commonly known as:  ANUSOL-HC Place 1 application rectally 2 (two) times daily.   Lancets Misc. Kit Use patient & insurance preferred lancet device to test blood glucose up to 3 times daily for Dx: E11.69   levothyroxine 175 MCG tablet Commonly known as:  SYNTHROID TAKE ONE TABLET BY MOUTH ONCE DAILY SUNDAY-THURSDAY  (TAKE  150MCG  ON  FRIDAY)   losartan 25 MG tablet Commonly known as:  Cozaar Take 1 tablet (25 mg total) by mouth daily.   Magnesium 250 MG Tabs Take 250 mg by mouth 2 (two) times daily.   omeprazole 20 MG capsule Commonly known as:  PRILOSEC Take 1 capsule (20 mg total) by mouth 2 (two) times daily before a meal.   Vitamin D3 50 MCG (2000 UT) capsule Take 2,000 Units by mouth 2 (two) times daily.       History (reviewed): Past Medical History:  Diagnosis Date  . Duodenal ulcer without hemorrhage or perforation    08-29-2015 per EGD reort  . Fatty liver   . Gastric ulcer without hemorrhage or perforation    08-29-2015 per EGD report  . Gastritis    per EGD 08-29-2015  . GERD (gastroesophageal reflux disease)   . Hearing loss    no hearing aids  . 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 History of kidney stones   . History of TIA (transient ischemic attack)    03-23-2014  w/ episode temporay amnesia  . Hyperlipidemia   . Hypertension   . Hypothyroidism   . Iron deficiency anemia   . LAFB (left anterior fascicular block)   . OA (osteoarthritis)   . OSA on CPAP    severe per study 2003  . 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 RBBB (right bundle branch block)   . Seasonal allergies   . Type 2 diabetes mellitus ([place])   . Vitamin D deficiency     Past Surgical History:  Procedure Laterality Date  . CARPAL TUNNEL RELEASE Right 05-10-2007  . CHOLECYSTECTOMY OPEN  1985   and Appendectomy  . CYSTO/  RIGHT URETEROSCOPIC STONE EXTRACTON/  STENT PLACEMENT  06/ 2016   in [place]  . CYSTOSCOPY/RETROGRADE/URETEROSCOPY/STONE EXTRACTION WITH BASKET Right 11/17/2015   Procedure: CYSTOSCOPY/RETROGRADE PYELOGRAM RIGHT/DIAGNOSTIC RIGHT URETEROSCOPY/LASER RENAL CALCIFICATION/RIGHT STENT PLACEMENT;  Surgeon: Cleon Gustin, MD;  Location[facility];  Service: Urology;  Laterality: Right;  . ESOPHAGOGASTRODUODENOSCOPY N/A 08/29/2015   Procedure: ESOPHAGOGASTRODUODENOSCOPY (EGD);  Surgeon: Danie Binder, MD;  [facility];  Service: Endoscopy;  Laterality: N/A;  . HOLMIUM LASER APPLICATION Right 1/61/0960   Procedure: HOLMIUM LASER APPLICATION;  Surgeon: Cleon Gustin, MD;  Location: [facility];  Service: Urology;  Laterality: Right;  . KNEE ARTHROSCOPY Right 1999  . LEFT HEEL REPAIR SURGERY  1954;  1985;  1995   polio  . LUMBAR SPINE SURGERY  09-01-2010   L4 -L5 fusion,  L3 laminectomy,  L5 - S1 foraminotomy  . OVARIAN CYST SURGERY Right 1966  . PARATHYROIDECTOMY N/A 05/06/2015   Procedure: PARATHYROIDECTOMY;  Surgeon: Armandina Gemma, MD;  Location: [facility];  Service: General;  Laterality: N/A;   left superior  . TONSILLECTOMY  as child  . TOTAL ABDOMINAL HYSTERECTOMY W/ BILATERAL SALPINGOOPHORECTOMY  1984  . TOTAL KNEE ARTHROPLASTY Right 04-08-2008  . TRANSTHORACIC ECHOCARDIOGRAM  04-07-2012   grade 1 diastolic function,  ef 45-40%/  mild AV calcification without stenosis/  trivial MR   Family History  Problem Relation Age of Onset  . Heart failure Mother        No details.  No MI  . Cancer Father        Lung  . Cancer Brother        Lung  . Stroke Maternal Grandfather   . Cancer Paternal Grandmother        rectal  . Cancer Paternal Grandfather        gastric   Social History    Socioeconomic History  . Marital status: Married    Spouse name: Lavell Luster  . Number of children: 4  . Years of education: 13-some college  . Highest education level: High school graduate  Occupational History  . Occupation: Retired  Scientific laboratory technician  . Financial resource strain: Not hard at all  . Food insecurity:    Worry: Never true    Inability: Never true  . Transportation needs:    Medical: No    Non-medical: No  Tobacco Use  . Smoking status: Never Smoker  . Smokeless tobacco: Never Used  Substance and Sexual Activity  . Alcohol use: No  . Drug use: No  . Sexual activity: Not Currently    Birth control/protection: Surgical  Lifestyle  . Physical activity:    Days per week: 0 days    Minutes per session: 0 min  . Stress: Not at all  Relationships  . Social connections:    Talks on phone: More than three times a week    Gets together: More than three times a week    Attends religious service: More than 4 times per year    Active member of club or organization: Yes    Attends meetings of clubs or organizations: More than 4 times per year    Relationship status: Married  Other Topics Concern  . Not on file  Social History Narrative   Lives with husband.  Four children.      Activities of Daily Living In your present state of health, do you have any difficulty performing the following activities: 10/12/2018 02/23/2018  Hearing? Y N  Comment hard of hearing-waiting for hearing aids -  Vision? Y N  Comment she is following up with her eye doctor for possible cataracts -  Difficulty concentrating or making decisions? N N  Walking or climbing stairs? N N  Dressing or bathing? N N  Doing errands, shopping? N N  Preparing Food and eating ? N -  Using the Toilet? N -  In the past six months, have you accidently leaked  urine? Y -  Comment only with cough/sneeze -  Do you have problems with loss of bowel control? N -  Managing your Medications? N -  Managing your Finances?  N -  Housekeeping or managing your Housekeeping? N -  Some recent data might be hidden    Patient Literacy How often do you need to have someone help you when you read instructions, pamphlets, or other written materials from your doctor or pharmacy?: 1 - Never What is the last grade level you completed in school?: 12th grade-some college  Exercise Current Exercise Habits: The patient does not participate in regular exercise at present, Exercise limited by: None identified  Diet Patient reports consuming 2 meals a day and 1 snack(s) a day Patient reports that her primary diet is: Regular Patient reports that she does have regular access to food.   Depression Screen PHQ 2/9 Scores 10/12/2018 04/24/2018 03/31/2018 03/06/2018 03/02/2018 01/27/2018 11/07/2017  PHQ - 2 Score 0 1 0 0 0 0 2  PHQ- 9 Score - - 0 - - - 7     Fall Risk Fall Risk  10/12/2018 04/24/2018 03/06/2018 03/02/2018 11/07/2017  Falls in the past year? 0 0 No No No  Number falls in past yr: - - - - -  Injury with Fall? - - - - -  Risk Factor Category  - - - - -  Risk for fall due to : - - - - -  Follow up - - - - -     Objective:  Karen Potter seemed alert and oriented and she participated appropriately during our telephone visit.  Blood Pressure Weight BMI  BP Readings from Last 3 Encounters:  10/09/18 115/79  10/04/18 116/65  06/22/18 120/70   Wt Readings from Last 3 Encounters:  10/09/18 251 lb (113.9 kg)  10/04/18 250 lb (113.4 kg)  05/31/18 251 lb (113.9 kg)   BMI Readings from Last 1 Encounters:  10/09/18 43.08 kg/m    *Unable to obtain current vital signs, weight, and BMI due to telephone visit type  Hearing/Vision  . Lova did not seem to have difficulty with hearing/understanding during the telephone conversation . Reports that she has had a formal eye exam by an eye care professional within the past year . Reports that she has had a formal hearing evaluation within the past year *Unable to fully  assess hearing and vision during telephone visit type  Cognitive Function: 6CIT Screen 10/12/2018  What Year? 0 points  What month? 0 points  What time? 0 points  Count back from 20 0 points  Months in reverse 0 points  Repeat phrase 0 points  Total Score 0    Normal Cognitive Function Screening: Yes (Normal:0-7, Significant for Dysfunction: >8)  Immunization & Health Maintenance Record Immunization History  Administered Date(s) Administered  . Influenza, High Dose Seasonal PF 03/20/2013, 02/24/2016, 02/07/2017, 03/10/2018  . Influenza,inj,Quad PF,6+ Mos 02/19/2014, 02/06/2015  . Pneumococcal Conjugate-13 08/05/2014  . Pneumococcal Polysaccharide-23 02/07/2009  . Tdap 05/10/2010  . Zoster 05/10/2010    Health Maintenance  Topic Date Due  . OPHTHALMOLOGY EXAM  11/05/2016  . COLONOSCOPY  06/17/2017  . MAMMOGRAM  06/06/2018  . FOOT EXAM  08/05/2018  . INFLUENZA VACCINE  12/09/2018  . HEMOGLOBIN A1C  04/06/2019  . TETANUS/TDAP  05/10/2020  . DEXA SCAN  Completed  . PNA vac Low Risk Adult  Completed       Assessment  This is a routine wellness examination for [facility]  P Heney.  Health Maintenance: Due or Overdue Health Maintenance Due  Topic Date Due  . OPHTHALMOLOGY EXAM  11/05/2016  . COLONOSCOPY  06/17/2017  . MAMMOGRAM  06/06/2018  . FOOT EXAM  08/05/2018    Karen Potter does not need a referral for Community Assistance: Care Management:   no Social Work:    no Prescription Assistance:  no Nutrition/Diabetes Education:  no   Plan:  Personalized Goals Goals Addressed            This Visit's Progress   . DIET - DECREASE SODA OR JUICE INTAKE        Personalized Health Maintenance & Screening Recommendations  Eye Exam-has appt scheduled in July  Lung Cancer Screening Recommended: no (Low Dose CT Chest recommended if Age 32-80 years, 30 pack-year currently smoking OR have quit w/in past 15 years) Hepatitis C Screening recommended: no HIV  Screening recommended: no  Advanced Directives: Written information was not prepared per patient's request.  Referrals & Orders No orders of the defined types were placed in this encounter.   Follow-up Plan . Follow-up with Karen Potter, Karen Kaufmann, MD as planned . Follow-up with your eye doctor as scheduled    I have personally reviewed and noted the following in the patient's chart:   . Medical and social history . Use of alcohol, tobacco or illicit drugs  . Current medications and supplements . Functional ability and status . Nutritional status . Physical activity . Advanced directives . List of other physicians . Hospitalizations, surgeries, and ER visits in previous 12 months . Vitals . Screenings to include cognitive, depression, and falls . Referrals and appointments  In addition, I have reviewed and discussed with Karen Potter certain preventive protocols, quality metrics, and best practice recommendations. A written personalized care plan for preventive services as well as general preventive health recommendations is available and can be mailed to the patient at her request.      Marylin Crosby  10/12/2018

## 2018-10-12 NOTE — Patient Instructions (Signed)
Preventive Care 77 Years and Older, Female Preventive care refers to lifestyle choices and visits with your health care provider that can promote health and wellness. What does preventive care include?  A yearly physical exam. This is also called an annual well check.  Dental exams once or twice a year.  Routine eye exams. Ask your health care provider how often you should have your eyes checked.  Personal lifestyle choices, including: ? Daily care of your teeth and gums. ? Regular physical activity. ? Eating a healthy diet. ? Avoiding tobacco and drug use. ? Limiting alcohol use. ? Practicing safe sex. ? Taking low-dose aspirin every day. ? Taking vitamin and mineral supplements as recommended by your health care provider. What happens during an annual well check? The services and screenings done by your health care provider during your annual well check will depend on your age, overall health, lifestyle risk factors, and family history of disease. Counseling Your health care provider may ask you questions about your:  Alcohol use.  Tobacco use.  Drug use.  Emotional well-being.  Home and relationship well-being.  Sexual activity.  Eating habits.  History of falls.  Memory and ability to understand (cognition).  Work and work Statistician.  Reproductive health.  Screening You may have the following tests or measurements:  Height, weight, and BMI.  Blood pressure.  Lipid and cholesterol levels. These may be checked every 5 years, or more frequently if you are over 30 years old.  Skin check.  Lung cancer screening. You may have this screening every year starting at age 27 if you have a 30-pack-year history of smoking and currently smoke or have quit within the past 15 years.  Colorectal cancer screening. All adults should have this screening starting at age 33 and continuing until age 46. You will have tests every 1-10 years, depending on your results and the  type of screening test. People at increased risk should start screening at an earlier age. Screening tests may include: ? Guaiac-based fecal occult blood testing. ? Fecal immunochemical test (FIT). ? Stool DNA test. ? Virtual colonoscopy. ? Sigmoidoscopy. During this test, a flexible tube with a tiny camera (sigmoidoscope) is used to examine your rectum and lower colon. The sigmoidoscope is inserted through your anus into your rectum and lower colon. ? Colonoscopy. During this test, a long, thin, flexible tube with a tiny camera (colonoscope) is used to examine your entire colon and rectum.  Hepatitis C blood test.  Hepatitis B blood test.  Sexually transmitted disease (STD) testing.  Diabetes screening. This is done by checking your blood sugar (glucose) after you have not eaten for a while (fasting). You may have this done every 1-3 years.  Bone density scan. This is done to screen for osteoporosis. You may have this done starting at age 37.  Mammogram. This may be done every 1-2 years. Talk to your health care provider about how often you should have regular mammograms. Talk with your health care provider about your test results, treatment options, and if necessary, the need for more tests. Vaccines Your health care provider may recommend certain vaccines, such as:  Influenza vaccine. This is recommended every year.  Tetanus, diphtheria, and acellular pertussis (Tdap, Td) vaccine. You may need a Td booster every 10 years.  Varicella vaccine. You may need this if you have not been vaccinated.  Zoster vaccine. You may need this after age 38.  Measles, mumps, and rubella (MMR) vaccine. You may need at least  one dose of MMR if you were born in 1957 or later. You may also need a second dose.  Pneumococcal 13-valent conjugate (PCV13) vaccine. One dose is recommended after age 24.  Pneumococcal polysaccharide (PPSV23) vaccine. One dose is recommended after age 24.  Meningococcal  vaccine. You may need this if you have certain conditions.  Hepatitis A vaccine. You may need this if you have certain conditions or if you travel or work in places where you may be exposed to hepatitis A.  Hepatitis B vaccine. You may need this if you have certain conditions or if you travel or work in places where you may be exposed to hepatitis B.  Haemophilus influenzae type b (Hib) vaccine. You may need this if you have certain conditions. Talk to your health care provider about which screenings and vaccines you need and how often you need them. This information is not intended to replace advice given to you by your health care provider. Make sure you discuss any questions you have with your health care provider. Document Released: 05/23/2015 Document Revised: 06/16/2017 Document Reviewed: 02/25/2015 Elsevier Interactive Patient Education  2019 [employer].

## 2018-10-13 ENCOUNTER — Telehealth: Payer: Medicare Other

## 2018-10-16 ENCOUNTER — Ambulatory Visit: Payer: Medicare Other | Admitting: *Deleted

## 2018-10-16 DIAGNOSIS — E1169 Type 2 diabetes mellitus with other specified complication: Secondary | ICD-10-CM

## 2018-10-17 ENCOUNTER — Telehealth: Payer: Medicare Other

## 2018-10-17 NOTE — Chronic Care Management (AMB) (Signed)
  Chronic Care Management   Follow Up Note   10/17/2018 Name: Karen Potter MRN: [id number] DOB: April 29, 1941  Referred by: Dettinger, Fransisca Kaufmann, MD Reason for referral : Chronic Care Management (RN Face to Face Follow Up)   Karen Potter is a 78 y.o. year old female who is a primary care patient of Dettinger, Fransisca Kaufmann, MD. The CCM team was consulted for assistance with chronic disease management and care coordination needs.    Review of patient status, including review of consultants reports, relevant laboratory and other test results, and collaboration with appropriate care team members and the patient's provider was performed as part of comprehensive patient evaluation and provision of chronic care management services.     Karen Potter came in to the office today to have her professional [facility] sensor removed and downloaded. I spoke with her in person in the triage office. She states that she is feeling better since Dr Warrick Parisian discontinued her Amaryl and that he also advised her to not start Januvia, which he had considered switching her to at her last visit. A referral to the [facility] was placed during that visit to help with Rx assistance and completing the [employer] application. This is no longer necessary since Dr Dettinger decided to not start her on Januvia. Patient received a telephone call and letter from [facility] with [facility]. I advised that the referral to them was for prescription assistance and not for CCM and that she may continue using the CCM program at our office. She was pleased with that.   Goals Addressed      Patient Stated   . "I don't want to have anymore episodes of my blood sugar dropping" (pt-stated)       Current Barriers:  Marland Kitchen Knowledge Deficits related to medication and diet.   Nurse Case Manager Clinical Goal(s):  Marland Kitchen Over the next 30 days, patient will work with [facility]  to address needs related to blood sugar management. . Over the next 30  days, patient will demonstrate improved health management independence as evidenced bybetter blood sugar control through adherence to diet recommendations.   . Over the next 90 days, patient will have an A1C result within provider recommended range.   Interventions:  . Discussed provider's recommendations with patient o Discontinue Amaryl due to hypoglycemic events o Continue to monitor blood sugar as needed . Advised patient to report any blood sugar readings below 60 or above 150 by calling [phone]. . Advised patient to report any new or worsening symptoms by calling [phone] . Recommended portion control and to limit foods that are high on the glycemic index . Reviewed professional [place] [facility] Results with patient and triage nurse, Karen Potter, who did the download.  o Blood sugar was stable without medication. See media for scanned report  Patient Self Care Activities:  . Calls pharmacy for medication refills . Performs ADL's independently . Performs IADL's independently . Calls provider office for new concerns or questions        Follow Up Plan The care management team will reach out to the patient again over the next 30 days.    Chong Sicilian, RN-BC, BSN Nurse Care Manager [facility] [phone]

## 2018-10-18 NOTE — Patient Instructions (Signed)
Visit Information  Goals Addressed            This Visit's Progress     Patient Stated   . "I don't want to have anymore episodes of my blood sugar dropping" (pt-stated)       Current Barriers:  Marland Kitchen Knowledge Deficits related to medication and diet.   Nurse Case Manager Clinical Goal(s):  Marland Kitchen Over the next 30 days, patient will work with [facility]  to address needs related to blood sugar management. . Over the next 30 days, patient will demonstrate improved health management independence as evidenced bybetter blood sugar control through adherence to diet recommendations.   . Over the next 90 days, patient will have an A1C result within provider recommended range.   Interventions:  . Discussed provider's recommendations with patient o Discontinue Amaryl due to hypoglycemic events o Continue to monitor blood sugar as needed . Advised patient to report any blood sugar readings below 60 or above 150 by calling [phone]. . Advised patient to report any new or worsening symptoms by calling [phone] . Recommended portion control and to limit foods that are high on the glycemic index . Reviewed professional [place] Results with patient and triage nurse, Jake Michaelis, who did the download.  o Blood sugar was stable without medication. See media for scanned report  Patient Self Care Activities:  . Calls pharmacy for medication refills . Performs ADL's independently . Performs IADL's independently . Calls provider office for new concerns or questions  Please see past updates related to this goal by clicking on the "Past Updates" button in the selected goal          The patient verbalized understanding of instructions provided today and declined a print copy of patient instruction materials.   The care management team will reach out to the patient again over the next 30 days.    Chong Sicilian, RN-BC, BSN Nurse Care Manager [facility] [phone]

## 2018-10-19 ENCOUNTER — Other Ambulatory Visit: Payer: Self-pay

## 2018-10-19 ENCOUNTER — Telehealth: Payer: Self-pay | Admitting: Neurology

## 2018-10-19 ENCOUNTER — Telehealth: Payer: Self-pay

## 2018-10-19 ENCOUNTER — Ambulatory Visit (HOSPITAL_COMMUNITY)
Admission: RE | Admit: 2018-10-19 | Discharge: 2018-10-19 | Disposition: A | Discharge status: Home / Self Care | Payer: Medicare Other | Source: Ambulatory Visit | Attending: Neurology | Admitting: Neurology

## 2018-10-19 DIAGNOSIS — R29818 Other symptoms and signs involving the nervous system: Secondary | ICD-10-CM

## 2018-10-19 DIAGNOSIS — G4733 Obstructive sleep apnea (adult) (pediatric): Secondary | ICD-10-CM | POA: Diagnosis not present

## 2018-10-19 DIAGNOSIS — Z9989 Dependence on other enabling machines and devices: Secondary | ICD-10-CM | POA: Insufficient documentation

## 2018-10-19 DIAGNOSIS — Z8673 Personal history of transient ischemic attack (TIA), and cerebral infarction without residual deficits: Secondary | ICD-10-CM | POA: Diagnosis not present

## 2018-10-19 DIAGNOSIS — R531 Weakness: Secondary | ICD-10-CM | POA: Diagnosis not present

## 2018-10-19 NOTE — Telephone Encounter (Signed)
-----   Message from [name], MD sent at 10/19/2018  1:19 PM EDT ----- Brain MRI Showed no acute changes, age-appropriate findings, please update patient. Karen Potter

## 2018-10-19 NOTE — Telephone Encounter (Signed)
I called pt. She tripped on her shoes walking down the hallway after her MRI. She reports that a doctor checked her out and sent her home. She skinned her knee and her hands and wrists are sore. I recommended she also get checked out by her PCP to make sure nothing is fractured. We will call pt with her MRI results when they become available. Pt verbalized understanding.

## 2018-10-19 NOTE — Progress Notes (Signed)
Brain MRI Showed no acute changes, age-appropriate findings, please update patient. Michel Bickers

## 2018-10-19 NOTE — Telephone Encounter (Signed)
I called pt, discussed her MRI brain results. Pt will keep her appt in January and will call us for any questions or concerns. Pt verbalized understanding of results.

## 2018-10-19 NOTE — Telephone Encounter (Signed)
Mary from [facility] called and stated patient fell after MRI, but she is home now she hurt her wrist.

## 2018-10-24 DIAGNOSIS — G4733 Obstructive sleep apnea (adult) (pediatric): Secondary | ICD-10-CM | POA: Diagnosis not present

## 2018-11-01 ENCOUNTER — Other Ambulatory Visit: Payer: Self-pay

## 2018-11-01 ENCOUNTER — Ambulatory Visit: Payer: Self-pay | Admitting: *Deleted

## 2018-11-01 DIAGNOSIS — M25562 Pain in left knee: Secondary | ICD-10-CM

## 2018-11-02 ENCOUNTER — Encounter: Payer: Self-pay | Admitting: Family Medicine

## 2018-11-02 ENCOUNTER — Ambulatory Visit (INDEPENDENT_AMBULATORY_CARE_PROVIDER_SITE_OTHER): Payer: Medicare Other | Admitting: Family Medicine

## 2018-11-02 ENCOUNTER — Ambulatory Visit (INDEPENDENT_AMBULATORY_CARE_PROVIDER_SITE_OTHER): Payer: Medicare Other

## 2018-11-02 VITALS — BP 125/76 | HR 68 | Temp 97.3°F | Ht 64.0 in | Wt 249.0 lb

## 2018-11-02 DIAGNOSIS — M25562 Pain in left knee: Secondary | ICD-10-CM

## 2018-11-02 MED ORDER — PREDNISONE 20 MG PO TABS: ORAL_TABLET | ORAL | 0 refills | Status: DC

## 2018-11-02 NOTE — Progress Notes (Signed)
BP 125/76   Pulse 68   Temp (!) 97.3 F (36.3 C) (Oral)   Ht _0  (1.626 m)   Wt 249 lb (112.9 kg)   BMI 42.74 kg/m    Subjective:   Patient ID: Karen Potter, female    DOB: 1940-09-22, 78 y.o.   MRN: [id number]  HPI: Karen Potter is a 78 y.o. female presenting on 11/02/2018 for Knee Pain (left- after a fall on 10/19/18)   HPI Left anterior knee pain Patient is coming in with left anterior knee pain.  Patient says she had a fall on 10/19/2018 after an MRI and she says she was walking and does not know how she fell but she fell out onto both of her hands of both of her knees.  She was initially sore in both of her hands and both of her knees but now is just the left anterior knee that still hurts.  She says she is able to weight-bear on it and walk with it but when she brushes it against anything or hurts.  She does not notice any color changes or swelling or bruising that she can see but it definitely hurts just below her kneecap on the anterior left knee.  Relevant past medical, surgical, family and social history reviewed and updated as indicated. Interim medical history since our last visit reviewed. Allergies and medications reviewed and updated.  Review of Systems  Constitutional: Negative for chills and fever.  Eyes: Negative for visual disturbance.  Respiratory: Negative for chest tightness and shortness of breath.   Cardiovascular: Negative for chest pain and leg swelling.  Musculoskeletal: Positive for arthralgias. Negative for back pain and gait problem.  Skin: Negative for color change, rash and wound.  Neurological: Negative for light-headedness and headaches.  Psychiatric/Behavioral: Negative for agitation and behavioral problems.  All other systems reviewed and are negative.   Per HPI unless specifically indicated above   Allergies as of 11/02/2018      Reactions   Codeine Itching   Dilaudid [hydromorphone] Other (See Comments)   Mouth blisters   Other  Itching   Most pain meds cause itching.  When has to take pain medication, she has been instructed to take Benadryl   Doxycycline Rash      Medication List       Accurate as of November 02, 2018  8:35 AM. If you have any questions, ask your nurse or doctor.        aspirin 325 MG tablet Take 325 mg by mouth every evening.   Blood Glucose Monitor System w/Device Kit Use glucose meter to check blood sugar up to 3 times daily as instructed. One Touch Verio preferred by insurance. Dx: EE11.69   Calcium 600 600 MG Tabs tablet Generic drug: calcium carbonate Take 600 mg by mouth 2 (two) times daily with a meal.   carvedilol 6.25 MG tablet Commonly known as: COREG Take 1 tablet (6.25 mg total) by mouth 2 (two) times daily with a meal.   clobetasol ointment 0.05 % Commonly known as: TEMOVATE Apply 1 application topically as needed.   clotrimazole-betamethasone cream Commonly known as: LOTRISONE Apply topically 2 (two) times daily.   Fish Oil Triple Strength 1400 MG Caps Take 1,000 mg by mouth 4 (four) times daily.   furosemide 40 MG tablet Commonly known as: LASIX Take 1 tablet (40 mg total) by mouth daily.   glucose blood test strip One Touch Verio- Use with glucose meter to check blood sugar  up to 3 times daily as instructed. Dx: E11.69   hydrocortisone 2.5 % rectal cream Commonly known as: ANUSOL-HC Place 1 application rectally 2 (two) times daily.   Lancets Misc. Kit Use patient & insurance preferred lancet device to test blood glucose up to 3 times daily for Dx: E11.69   levothyroxine 175 MCG tablet Commonly known as: SYNTHROID TAKE ONE TABLET BY MOUTH ONCE DAILY SUNDAY-THURSDAY  (TAKE  150MCG  ON  FRIDAY)   losartan 25 MG tablet Commonly known as: Cozaar Take 1 tablet (25 mg total) by mouth daily.   Magnesium 250 MG Tabs Take 250 mg by mouth 2 (two) times daily.   omeprazole 20 MG capsule Commonly known as: PRILOSEC Take 1 capsule (20 mg total) by mouth 2  (two) times daily before a meal.   Vitamin D3 50 MCG (2000 UT) capsule Take 2,000 Units by mouth 2 (two) times daily.        Objective:   BP 125/76   Pulse 68   Temp (!) 97.3 F (36.3 C) (Oral)   Ht _0  (1.626 m)   Wt 249 lb (112.9 kg)   BMI 42.74 kg/m   Wt Readings from Last 3 Encounters:  11/02/18 249 lb (112.9 kg)  10/09/18 251 lb (113.9 kg)  10/04/18 250 lb (113.4 kg)    Physical Exam Vitals signs and nursing note reviewed.  Constitutional:      General: She is not in acute distress.    Appearance: She is well-developed. She is not diaphoretic.  Eyes:     Conjunctiva/sclera: Conjunctivae normal.  Musculoskeletal: Normal range of motion.     Left knee: She exhibits swelling. She exhibits normal range of motion, no effusion, normal alignment, no LCL laxity, normal patellar mobility, normal meniscus and no MCL laxity. Tenderness (Anterior tenderness just below the kneecap) found.       Legs:  Skin:    General: Skin is warm and dry.     Findings: No rash.  Neurological:     Mental Status: She is alert and oriented to person, place, and time.     Coordination: Coordination normal.  Psychiatric:        Behavior: Behavior normal.     Left knee x-ray: Medial compartmental narrowing consistent with osteoarthritis, no sign of acute abnormality.  Await final read from radiologist  Assessment & Plan:   Problem List Items Addressed This Visit    None    Visit Diagnoses    Acute pain of left knee    -  Primary   Relevant Orders   DG Knee 1-2 Views Left      Likely a contusion, recommended anti-inflammatories and will give a short course of prednisone, watch her sugars closely. Follow up plan: No follow-ups on file.  Counseling provided for all of the vaccine components Orders Placed This Encounter  Procedures  . DG Knee 1-2 Views Left    Caryl Pina, MD [facility] 11/02/2018, 8:35 AM

## 2018-11-02 NOTE — Chronic Care Management (AMB) (Signed)
  Chronic Care Management   Telephone Note  11/01/2018 Name: LANGLEY FLATLEY MRN: [id number] DOB: 1941-01-01  Acute issue addressed today. Ms Privitera called in to the office to speak with me regarding left knee pain x 2 weeks s/p fall at [facility] while she was there for unrelated testing. She complains that the knee is tender to the touch but denies any pain with walking.    Follow up plan: Appointment scheduled with PCP for 11/02/2018. Discussed the possibility of an xray.     Chong Sicilian, RN-BC, BSN Nurse Care Manager [facility] [phone]

## 2018-11-16 ENCOUNTER — Telehealth: Payer: Medicare Other

## 2018-11-16 DIAGNOSIS — E119 Type 2 diabetes mellitus without complications: Secondary | ICD-10-CM | POA: Diagnosis not present

## 2018-11-16 DIAGNOSIS — H25813 Combined forms of age-related cataract, bilateral: Secondary | ICD-10-CM | POA: Diagnosis not present

## 2018-11-16 LAB — HM DIABETES EYE EXAM

## 2018-11-23 DIAGNOSIS — G4733 Obstructive sleep apnea (adult) (pediatric): Secondary | ICD-10-CM | POA: Diagnosis not present

## 2018-11-24 ENCOUNTER — Other Ambulatory Visit: Payer: Self-pay | Admitting: Family Medicine

## 2018-12-05 ENCOUNTER — Ambulatory Visit
Admission: RE | Admit: 2018-12-05 | Discharge: 2018-12-05 | Disposition: A | Discharge status: Home / Self Care | Payer: Medicare Other | Source: Ambulatory Visit | Attending: Family Medicine | Admitting: Family Medicine

## 2018-12-05 ENCOUNTER — Other Ambulatory Visit: Payer: Self-pay

## 2018-12-05 DIAGNOSIS — Z1231 Encounter for screening mammogram for malignant neoplasm of breast: Secondary | ICD-10-CM

## 2018-12-12 ENCOUNTER — Other Ambulatory Visit: Payer: Self-pay | Admitting: Family Medicine

## 2018-12-19 ENCOUNTER — Other Ambulatory Visit: Payer: Self-pay | Admitting: Family Medicine

## 2018-12-24 DIAGNOSIS — G4733 Obstructive sleep apnea (adult) (pediatric): Secondary | ICD-10-CM | POA: Diagnosis not present

## 2019-01-03 DIAGNOSIS — K219 Gastro-esophageal reflux disease without esophagitis: Secondary | ICD-10-CM | POA: Diagnosis not present

## 2019-01-03 DIAGNOSIS — K625 Hemorrhage of anus and rectum: Secondary | ICD-10-CM | POA: Diagnosis not present

## 2019-01-03 DIAGNOSIS — R197 Diarrhea, unspecified: Secondary | ICD-10-CM | POA: Diagnosis not present

## 2019-01-03 DIAGNOSIS — K649 Unspecified hemorrhoids: Secondary | ICD-10-CM | POA: Diagnosis not present

## 2019-01-04 DIAGNOSIS — R197 Diarrhea, unspecified: Secondary | ICD-10-CM | POA: Diagnosis not present

## 2019-01-04 DIAGNOSIS — K625 Hemorrhage of anus and rectum: Secondary | ICD-10-CM | POA: Diagnosis not present

## 2019-01-05 ENCOUNTER — Ambulatory Visit (INDEPENDENT_AMBULATORY_CARE_PROVIDER_SITE_OTHER): Payer: Medicare Other | Admitting: Family Medicine

## 2019-01-05 ENCOUNTER — Encounter: Payer: Self-pay | Admitting: Family Medicine

## 2019-01-05 DIAGNOSIS — E1159 Type 2 diabetes mellitus with other circulatory complications: Secondary | ICD-10-CM | POA: Diagnosis not present

## 2019-01-05 DIAGNOSIS — E039 Hypothyroidism, unspecified: Secondary | ICD-10-CM

## 2019-01-05 DIAGNOSIS — E1169 Type 2 diabetes mellitus with other specified complication: Secondary | ICD-10-CM

## 2019-01-05 DIAGNOSIS — R197 Diarrhea, unspecified: Secondary | ICD-10-CM | POA: Diagnosis not present

## 2019-01-05 DIAGNOSIS — I10 Essential (primary) hypertension: Secondary | ICD-10-CM

## 2019-01-05 DIAGNOSIS — I152 Hypertension secondary to endocrine disorders: Secondary | ICD-10-CM

## 2019-01-05 MED ORDER — ONDANSETRON 4 MG PO TBDP: 4.0000 mg | ORAL_TABLET | Freq: Three times a day (TID) | ORAL | 0 refills | Status: DC | PRN

## 2019-01-05 MED ORDER — DICLOFENAC SODIUM 75 MG PO TBEC: 75.0000 mg | DELAYED_RELEASE_TABLET | Freq: Two times a day (BID) | ORAL | 3 refills | Status: DC

## 2019-01-05 MED ORDER — LEVOTHYROXINE SODIUM 175 MCG PO TABS: ORAL_TABLET | ORAL | 3 refills | Status: DC

## 2019-01-05 MED ORDER — LOSARTAN POTASSIUM 25 MG PO TABS: 25.0000 mg | ORAL_TABLET | Freq: Every day | ORAL | 3 refills | Status: DC

## 2019-01-05 MED ORDER — FUROSEMIDE 40 MG PO TABS: 40.0000 mg | ORAL_TABLET | Freq: Every day | ORAL | 3 refills | Status: DC

## 2019-01-05 MED ORDER — CARVEDILOL 6.25 MG PO TABS: 6.2500 mg | ORAL_TABLET | Freq: Two times a day (BID) | ORAL | 3 refills | Status: DC

## 2019-01-05 MED ORDER — TIZANIDINE HCL 4 MG PO TABS: 2.0000 mg | ORAL_TABLET | Freq: Four times a day (QID) | ORAL | 3 refills | Status: DC | PRN

## 2019-01-05 NOTE — Progress Notes (Signed)
Virtual Visit via telephone Note  I connected with Karen Potter on 01/05/19 at 1026 by telephone and verified that I am speaking with the correct person using two identifiers. Karen Potter is currently located at home and no other people are currently with her during visit. The provider, Fransisca Kaufmann Rawlin Reaume, MD is located in their office at time of visit.  Call ended at 1045  I discussed the limitations, risks, security and privacy concerns of performing an evaluation and management service by telephone and the availability of in person appointments. I also discussed with the patient that there may be a patient responsible charge related to this service. The patient expressed understanding and agreed to proceed.   History and Present Illness: Type 2 diabetes mellitus Patient comes in today for recheck of his diabetes. Patient has been currently taking diet control and bs 119. Patient is currently on an ACE inhibitor/ARB. Patient has seen an ophthalmologist this year. Patient denies any issues with their feet.   Hypothyroidism recheck Patient is coming in for thyroid recheck today as well. They deny any issues with hair changes or heat or cold problems or diarrhea or constipation. They deny any chest pain or palpitations. They are currently on levothyroxine 150 and 143mcrograms   Hypertension Patient is currently on losartan, and their blood pressure today is 136/78. Patient denies any lightheadedness or dizziness. Patient denies headaches, blurred vision, chest pains, shortness of breath, or weakness. Denies any side effects from medication and is content with current medication.   Hyperlipidemia Patient is coming in for recheck of his hyperlipidemia. The patient is currently taking fish oils. They deny any issues with myalgias or history of liver damage from it. They deny any focal numbness or weakness or chest pain.   Patient is having blood in stools and is taking tylenol and saw  her GI doctor yesterday and they drew blood yesterday and she will take a stool culture.  No diagnosis found.  Outpatient Encounter Medications as of 01/05/2019  Medication Sig  . aspirin 325 MG tablet Take 325 mg by mouth every evening.   . Blood Glucose Monitoring Suppl (BLOOD GLUCOSE MONITOR SYSTEM) w/Device KIT Use glucose meter to check blood sugar up to 3 times daily as instructed. One Touch Verio preferred by insurance. Dx: EE11.69  . calcium carbonate (CALCIUM 600) 600 MG TABS tablet Take 600 mg by mouth 2 (two) times daily with a meal.  . carvedilol (COREG) 6.25 MG tablet Take 1 tablet (6.25 mg total) by mouth 2 (two) times daily with a meal.  . Cholecalciferol (VITAMIN D3) 2000 UNITS capsule Take 2,000 Units by mouth 2 (two) times daily.   . clobetasol ointment (TEMOVATE) 02.67% Apply 1 application topically as needed.  . clotrimazole-betamethasone (LOTRISONE) cream Apply topically 2 (two) times daily.  . furosemide (LASIX) 40 MG tablet Take 1 tablet (40 mg total) by mouth daily.  .Marland Kitchenglimepiride (AMARYL) 2 MG tablet TAKE 1 TABLET BY MOUTH ONCE DAILY BEFORE BREAKFAST  . glucose blood test strip One Touch Verio- Use with glucose meter to check blood sugar up to 3 times daily as instructed. Dx: E11.69  . hydrocortisone (ANUSOL-HC) 2.5 % rectal cream Place 1 application rectally 2 (two) times daily.  . Lancets Misc. KIT Use patient & insurance preferred lancet device to test blood glucose up to 3 times daily for Dx: E11.69  . levothyroxine (SYNTHROID, LEVOTHROID) 175 MCG tablet TAKE ONE TABLET BY MOUTH ONCE DAILY SUNDAY-THURSDAY  (TAKE  150MCG  ON  FRIDAY)  . losartan (COZAAR) 25 MG tablet Take 1 tablet (25 mg total) by mouth daily.  . Magnesium 250 MG TABS Take 250 mg by mouth 2 (two) times daily.  . Omega-3 Fatty Acids (FISH OIL TRIPLE STRENGTH) 1400 MG CAPS Take 1,000 mg by mouth 4 (four) times daily.   Marland Kitchen omeprazole (PRILOSEC) 20 MG capsule Take 1 capsule (20 mg total) by mouth 2 (two)  times daily before a meal.  . predniSONE (DELTASONE) 20 MG tablet 2 po at same time daily for 5 days   No facility-administered encounter medications on file as of 01/05/2019.     Review of Systems  Constitutional: Negative for chills and fever.  Eyes: Negative for visual disturbance.  Respiratory: Negative for chest tightness and shortness of breath.   Cardiovascular: Negative for chest pain and leg swelling.  Gastrointestinal: Positive for abdominal distention, diarrhea and nausea.  Musculoskeletal: Positive for back pain (chronic). Negative for gait problem.  Skin: Negative for rash.  Neurological: Negative for light-headedness and headaches.  Psychiatric/Behavioral: Negative for agitation and behavioral problems.  All other systems reviewed and are negative.   Observations/Objective: Patient sounds comfortable and in no acute distress  Assessment and Plan: Problem List Items Addressed This Visit      Cardiovascular and Mediastinum   Hypertension associated with diabetes ([place])   Relevant Medications   losartan (COZAAR) 25 MG tablet   carvedilol (COREG) 6.25 MG tablet   furosemide (LASIX) 40 MG tablet     Endocrine   Hypothyroid   Relevant Medications   carvedilol (COREG) 6.25 MG tablet   levothyroxine (SYNTHROID) 175 MCG tablet   Type 2 diabetes mellitus with other specified complication (HCC) - Primary   Relevant Medications   losartan (COZAAR) 25 MG tablet       Follow Up Instructions:  Follow up in 3 months. No changes in medications   I discussed the assessment and treatment plan with the patient. The patient was provided an opportunity to ask questions and all were answered. The patient agreed with the plan and demonstrated an understanding of the instructions.   The patient was advised to call back or seek an in-person evaluation if the symptoms worsen or if the condition fails to improve as anticipated.  The above assessment and management plan was  discussed with the patient. The patient verbalized understanding of and has agreed to the management plan. Patient is aware to call the clinic if symptoms persist or worsen. Patient is aware when to return to the clinic for a follow-up visit. Patient educated on when it is appropriate to go to the emergency department.    I provided 19 minutes of non-face-to-face time during this encounter.    Worthy Rancher, MD

## 2019-01-09 ENCOUNTER — Ambulatory Visit: Payer: Self-pay | Admitting: *Deleted

## 2019-01-09 DIAGNOSIS — E1159 Type 2 diabetes mellitus with other circulatory complications: Secondary | ICD-10-CM

## 2019-01-09 DIAGNOSIS — I152 Hypertension secondary to endocrine disorders: Secondary | ICD-10-CM

## 2019-01-09 DIAGNOSIS — E1169 Type 2 diabetes mellitus with other specified complication: Secondary | ICD-10-CM

## 2019-01-10 ENCOUNTER — Ambulatory Visit: Payer: Self-pay | Admitting: *Deleted

## 2019-01-10 NOTE — Patient Instructions (Signed)
Visit Information  Goals Addressed            This Visit's Progress     Patient Stated   . "I want to make sure my kidneys are ok" (pt-stated)       Current Barriers:  Marland Kitchen Knowledge Deficits related to elevated kidney functions  Nurse Case Manager Clinical Goal(s):  Marland Kitchen Over the next 7 days, patient will meet with RN Care Manager to address elevated kidney functions on recent labs  Interventions:  . Looked for lab result fax from [place] but was unable to locate it . Left message for [facility] medical records to fax labs to Dr Dettinger's attention . Discussed patient provided lab results from [facility] o GFR 37 o Creatinine 1.38 . Updated Dr Dettinger on results that patient gave me. Will wait for additional instructions after he reviews faxed labs . Reviewed prior labs and noted normal kidney functions . Reviewed medication list with patient . Advised to avoid OTC NSAIDs and to hold diclofenac for now . Increase water intake . Verbal education provided on kidney functions . Discussed potential causes for acutely elevated kidney functions  Patient Self Care Activities:  . Performs ADL's independently . Performs IADL's independently  Initial goal documentation        The patient verbalized understanding of instructions provided today and declined a print copy of patient instruction materials.   The care management team will reach out to the patient again over the next 2 days.    Chong Sicilian BSN, RN-BC Embedded Chronic Care Manager [facility] / [place] [facility] Direct Dial: [phone]

## 2019-01-10 NOTE — Chronic Care Management (AMB) (Signed)
Chronic Care Management   Follow Up Note   01/09/2019 Name: Karen Potter MRN: [id number] DOB: 1941/01/25  Referred by: Dettinger, Fransisca Kaufmann, MD Reason for referral : Chronic Care Management (RNCM follow up)   Karen Potter is a 78 y.o. year old female who is a primary care patient of Dettinger, Fransisca Kaufmann, MD. The CCM team was consulted for assistance with chronic disease management and care coordination needs.    Review of patient status, including review of consultants reports, relevant laboratory and other test results, and collaboration with appropriate care team members and the patient's provider was performed as part of comprehensive patient evaluation and provision of chronic care management services.    SDOH (Social Determinants of Health) screening performed today: Physical Activity. See Care Plan for related entries.   Outpatient Encounter Medications as of 01/09/2019  Medication Sig Note  . aspirin 325 MG tablet Take 325 mg by mouth every evening.    . Blood Glucose Monitoring Suppl (BLOOD GLUCOSE MONITOR SYSTEM) w/Device KIT Use glucose meter to check blood sugar up to 3 times daily as instructed. One Touch Verio preferred by insurance. 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 calcium carbonate (CALCIUM 600) 600 MG TABS tablet Take 600 mg by mouth 2 (two) times daily with a meal.   . carvedilol (COREG) 6.25 MG tablet Take 1 tablet (6.25 mg total) by mouth 2 (two) times daily with a meal.   . Cholecalciferol (VITAMIN D3) 2000 UNITS capsule Take 2,000 Units by mouth 2 (two) times daily.    . clobetasol ointment (TEMOVATE) 0.63 % Apply 1 application topically as needed.   . clotrimazole-betamethasone (LOTRISONE) cream Apply topically 2 (two) times daily.   . diclofenac (VOLTAREN) 75 MG EC tablet Take 1 tablet (75 mg total) by mouth 2 (two) times daily.   . furosemide (LASIX) 40 MG tablet Take 1 tablet (40 mg total) by mouth daily.   Marland Kitchen glucose blood test strip One Touch Verio- Use with glucose meter to  check blood sugar up to 3 times daily as instructed. Dx: E11.69   . hydrocortisone (ANUSOL-HC) 2.5 % rectal cream Place 1 application rectally 2 (two) times daily.   . Lancets Misc. KIT Use patient & insurance preferred lancet device to test blood glucose up to 3 times daily for Dx: E11.69   . levothyroxine (SYNTHROID) 175 MCG tablet TAKE ONE TABLET BY MOUTH ONCE DAILY SUNDAY-THURSDAY  (TAKE  150MCG  ON  FRIDAY)   . losartan (COZAAR) 25 MG tablet Take 1 tablet (25 mg total) by mouth daily.   . Magnesium 250 MG TABS Take 250 mg by mouth 2 (two) times daily.   . Omega-3 Fatty Acids (FISH OIL TRIPLE STRENGTH) 1400 MG CAPS Take 1,000 mg by mouth 4 (four) times daily.  05/06/2017: 1430 EPA, 570 DHA  . omeprazole (PRILOSEC) 20 MG capsule Take 1 capsule (20 mg total) by mouth 2 (two) times daily before a meal.   . ondansetron (ZOFRAN ODT) 4 MG disintegrating tablet Take 1 tablet (4 mg total) by mouth every 8 (eight) hours as needed for nausea or vomiting.   Marland Kitchen tiZANidine (ZANAFLEX) 4 MG tablet Take 0.5-1 tablets (2-4 mg total) by mouth every 6 (six) hours as needed for muscle spasms.    No facility-administered encounter medications on file as of 01/09/2019.      Goals Addressed            This Visit's Progress     Patient Stated   . "I  want to make sure my kidneys are ok" (pt-stated)       Current Barriers:  Marland Kitchen Knowledge Deficits related to elevated kidney functions  Nurse Case Manager Clinical Goal(s):  Marland Kitchen Over the next 7 days, patient will meet with RN Care Manager to address elevated kidney functions on recent labs  Interventions:  . Looked for lab result fax from [place] but was unable to locate it . Left message for [facility] medical records to fax labs to Dr Dettinger's attention . Discussed patient provided lab results from [facility] o GFR 37 o Creatinine 1.38 . Updated Dr Dettinger on results that patient gave me. Will wait for additional instructions after he reviews faxed labs .  Reviewed prior labs and noted normal kidney functions . Reviewed medication list with patient . Advised to avoid OTC NSAIDs and to hold diclofenac for now . Increase water intake . Verbal education provided on kidney functions . Discussed potential causes for acutely elevated kidney functions  Patient Self Care Activities:  . Performs ADL's independently . Performs IADL's independently  Initial goal documentation        Follow up Plan The care management team will reach out to the patient again over the next 2 days.    Chong Sicilian BSN, RN-BC Embedded Chronic Care Manager [facility] / [place] [facility] Direct Dial: [phone]

## 2019-01-17 ENCOUNTER — Other Ambulatory Visit: Payer: Self-pay

## 2019-01-17 DIAGNOSIS — Z8711 Personal history of peptic ulcer disease: Secondary | ICD-10-CM | POA: Diagnosis not present

## 2019-01-17 DIAGNOSIS — R110 Nausea: Secondary | ICD-10-CM | POA: Diagnosis not present

## 2019-01-17 DIAGNOSIS — N289 Disorder of kidney and ureter, unspecified: Secondary | ICD-10-CM | POA: Diagnosis not present

## 2019-01-17 DIAGNOSIS — R1030 Lower abdominal pain, unspecified: Secondary | ICD-10-CM | POA: Diagnosis not present

## 2019-01-17 DIAGNOSIS — R198 Other specified symptoms and signs involving the digestive system and abdomen: Secondary | ICD-10-CM | POA: Diagnosis not present

## 2019-01-18 ENCOUNTER — Ambulatory Visit (INDEPENDENT_AMBULATORY_CARE_PROVIDER_SITE_OTHER): Payer: Medicare Other

## 2019-01-18 DIAGNOSIS — Z23 Encounter for immunization: Secondary | ICD-10-CM

## 2019-01-24 DIAGNOSIS — G4733 Obstructive sleep apnea (adult) (pediatric): Secondary | ICD-10-CM | POA: Diagnosis not present

## 2019-01-30 ENCOUNTER — Ambulatory Visit (INDEPENDENT_AMBULATORY_CARE_PROVIDER_SITE_OTHER): Payer: Medicare Other | Admitting: *Deleted

## 2019-01-30 DIAGNOSIS — K219 Gastro-esophageal reflux disease without esophagitis: Secondary | ICD-10-CM

## 2019-01-30 DIAGNOSIS — E1169 Type 2 diabetes mellitus with other specified complication: Secondary | ICD-10-CM | POA: Diagnosis not present

## 2019-02-07 NOTE — Chronic Care Management (AMB) (Signed)
  Chronic Care Management   Follow Up Note   01/30/2019 Name: Karen Potter MRN: [id number] DOB: [birth year]  Referred by: Dettinger, Fransisca Kaufmann, MD Reason for referral : Chronic Care Management   Karen Potter is a 78 y.o. year old female who is a primary care patient of Dettinger, Fransisca Kaufmann, MD. The CCM team was consulted for assistance with chronic disease management and care coordination needs.    Review of patient status, including review of consultants reports, relevant laboratory and other test results, and collaboration with appropriate care team members and the patient's provider was performed as part of comprehensive patient evaluation and provision of chronic care management services.    I spoke with Karen Potter by telephone. She has been seeing Dr Watt Climes at [facility] and had elevated kidney functions on recent lab tests. Because she is a diabetic she is at risk for kidney disease. Her kidney functions need to be routinely monitored. She was also treated with Amoxicillin for group B strep that was found in her urine. Her abd pain is better since taking Amoxicillin but she continues to have some nausea. She plans to continue following up with [facility].    Marland Kitchen "I want to make sure my kidneys are ok" (pt-stated)       Current Barriers:  Marland Kitchen Knowledge Deficits related to elevated kidney functions  Nurse Case Manager Clinical Goal(s):  Marland Kitchen Over the next 7 days, patient will meet with RN Care Manager to address elevated kidney functions on recent labs  Interventions:  . Chart reviewed. No scanned lab reports from [facility].  Marland Kitchen Patient did discuss initial blood work results with [facility]. Repeat was done at the last visit.  Virgel Gess letter to [facility] requesting recent lab results be faxed to [phone] . Will collaborate with PCP once results are received  Patient Self Care Activities:  . Performs ADL's independently . Performs IADL's independently  Please see past updates related to this  goal by clicking on the "Past Updates" button in the selected goal        Follow-Up Will await records from [place] Patient will reach out to CCM team as needed RN CCM will f/u with patient over the next 2 weeks   Chong Sicilian, BSN, RN-BC [place] / [place]: [phone]

## 2019-02-07 NOTE — Patient Instructions (Signed)
Visit Information  Goals Addressed            This Visit's Progress     Patient Stated   . "I want to make sure my kidneys are ok" (pt-stated)       Current Barriers:  Marland Kitchen Knowledge Deficits related to elevated kidney functions  Nurse Case Manager Clinical Goal(s):  Marland Kitchen Over the next 7 days, patient will meet with RN Care Manager to address elevated kidney functions on recent labs  Interventions:  . Chart reviewed. No scanned lab reports from [facility].  Marland Kitchen Patient did discuss initial blood work results with [facility]. Repeat was done at the last visit.  Virgel Gess letter to [facility] requesting recent lab results be faxed to [phone] . Will collaborate with PCP once results are received  Patient Self Care Activities:  . Performs ADL's independently . Performs IADL's independently  Please see past updates related to this goal by clicking on the "Past Updates" button in the selected goal        The patient verbalized understanding of instructions provided today and declined a print copy of patient instruction materials.    Follow-Up Will await records from [facility] Patient will reach out to CCM team as needed RN CCM will f/u with patient over the next 2 weeks   Chong Sicilian, BSN, RN-BC [place] / [place] Management Direct Dial: [phone]

## 2019-02-13 ENCOUNTER — Ambulatory Visit: Payer: Medicare Other | Admitting: *Deleted

## 2019-02-13 DIAGNOSIS — E1169 Type 2 diabetes mellitus with other specified complication: Secondary | ICD-10-CM

## 2019-02-13 NOTE — Patient Instructions (Signed)
  Goals Addressed            This Visit's Progress     Patient Stated   . "I want to make sure my kidneys are ok" (pt-stated)       Current Barriers:  Marland Kitchen Knowledge Deficits related to elevated kidney functions  Nurse Case Manager Clinical Goal(s):  Marland Kitchen Over the next 7 days, patient will meet with RN Care Manager to address elevated kidney functions on recent labs  Interventions:  . Chart reviewed. Patient is due for a follow-up with [facility] next month. . Was unable to locate recent lab report from [facility] . Faxed record request to [facility] [phone] . Repeat kidney functions should be done at visit with PCP in [place]  Patient Self Care Activities:  . Performs ADL's independently . Performs IADL's independently  Please see past updates related to this goal by clicking on the "Past Updates" button in the selected goal         Follow Up Plan CCM team will reach out to the patient over the next 30 days   Chong Sicilian, BSN, RN-BC [place] / [place]: [phone]

## 2019-02-13 NOTE — Chronic Care Management (AMB) (Signed)
  Chronic Care Management   Follow Up Note   02/13/2019 Name: Karen Potter MRN: [id number] DOB: 07/12/1940  Referred by: Dettinger, Fransisca Kaufmann, MD Reason for referral : Chronic Care Management (Care Coordination)   Karen Potter is a 78 y.o. year old female who is a primary care patient of Dettinger, Fransisca Kaufmann, MD. The CCM team was consulted for assistance with chronic disease management and care coordination needs.    Karen Potter's lab results from [facility] have shown increased renal functions. She is also a diabetic which can affect her kidney functions.   Goals Addressed            This Visit's Progress     Patient Stated   . "I want to make sure my kidneys are ok" (pt-stated)       Current Barriers:  Marland Kitchen Knowledge Deficits related to elevated kidney functions  Nurse Case Manager Clinical Goal(s):  Marland Kitchen Over the next 7 days, patient will meet with RN Care Manager to address elevated kidney functions on recent labs  Interventions:  . Chart reviewed. Patient is due for a follow-up with [facility] next month. . Was unable to locate recent lab report from [facility] . Faxed record request to [facility] [phone] . Repeat kidney functions should be done at visit with PCP in [place]  Patient Self Care Activities:  . Performs ADL's independently . Performs IADL's independently  Please see past updates related to this goal by clicking on the "Past Updates" button in the selected goal         Follow Up Plan CCM team will reach out to the patient over the next 30 days   Chong Sicilian, BSN, RN-BC [place] / [place]: [phone]

## 2019-02-20 ENCOUNTER — Ambulatory Visit: Payer: Medicare Other | Admitting: *Deleted

## 2019-02-20 DIAGNOSIS — I152 Hypertension secondary to endocrine disorders: Secondary | ICD-10-CM

## 2019-02-20 DIAGNOSIS — E1159 Type 2 diabetes mellitus with other circulatory complications: Secondary | ICD-10-CM

## 2019-02-20 DIAGNOSIS — E1169 Type 2 diabetes mellitus with other specified complication: Secondary | ICD-10-CM

## 2019-02-20 NOTE — Chronic Care Management (AMB) (Signed)
  Chronic Care Management   Follow-up Outreach  02/20/2019 Name: Karen Potter MRN: [id number] DOB: Jun 22, 1940  I made an unsuccessful telephone follow-up call to [name] today. I would like to talk with her about her recently elevated Kidney functions as well as her diabetes and blood sugar control.   Follow up plan: A HIPPA compliant phone message was left for the patient providing contact information and requesting a return call.  The care management team will reach out to the patient again over the next 5 days.     Chong Sicilian, BSN, RN-BC Embedded Chronic Care Manager [facility] / [place] Management Direct Dial: [phone]

## 2019-03-02 DIAGNOSIS — Z1159 Encounter for screening for other viral diseases: Secondary | ICD-10-CM | POA: Diagnosis not present

## 2019-03-07 ENCOUNTER — Encounter: Payer: Self-pay | Admitting: Family Medicine

## 2019-03-07 DIAGNOSIS — R110 Nausea: Secondary | ICD-10-CM | POA: Diagnosis not present

## 2019-03-07 DIAGNOSIS — K449 Diaphragmatic hernia without obstruction or gangrene: Secondary | ICD-10-CM | POA: Diagnosis not present

## 2019-03-08 ENCOUNTER — Telehealth: Payer: Medicare Other

## 2019-03-27 ENCOUNTER — Telehealth: Payer: Medicare Other

## 2019-03-29 ENCOUNTER — Other Ambulatory Visit: Payer: Self-pay | Admitting: Family Medicine

## 2019-03-30 ENCOUNTER — Telehealth: Payer: Medicare Other | Admitting: *Deleted

## 2019-04-09 ENCOUNTER — Other Ambulatory Visit: Payer: Self-pay | Admitting: Family Medicine

## 2019-04-16 ENCOUNTER — Other Ambulatory Visit: Payer: Self-pay

## 2019-06-05 ENCOUNTER — Other Ambulatory Visit: Payer: Self-pay

## 2019-06-05 ENCOUNTER — Ambulatory Visit: Payer: Medicare Other | Admitting: Adult Health

## 2019-06-05 ENCOUNTER — Encounter: Payer: Self-pay | Admitting: Adult Health

## 2019-06-05 VITALS — BP 116/73 | HR 73 | Temp 97.0°F | Ht 64.0 in | Wt 246.6 lb

## 2019-06-05 DIAGNOSIS — G4733 Obstructive sleep apnea (adult) (pediatric): Secondary | ICD-10-CM | POA: Diagnosis not present

## 2019-06-05 DIAGNOSIS — Z9989 Dependence on other enabling machines and devices: Secondary | ICD-10-CM | POA: Diagnosis not present

## 2019-06-05 NOTE — Progress Notes (Addendum)
PATIENT: Karen Potter DOB: 01/27/1941  REASON FOR VISIT: follow up HISTORY FROM: patient  HISTORY OF PRESENT ILLNESS: Today 06/05/19:  Karen Potter is a 79 year old female with a history of obstructive sleep apnea on CPAP.  Her download indicates that she used her machine 30 out of 30 days for compliance of 100%.  She use her machine greater than 4 hours 29 days for compliance of 97%.  On average she uses her machine 7 hours and 16 minutes.  Her residual AHI is 0.7 on 12 cm of water with EPR of 1 her leak in the 95th percentile is 15.1 L/min.  She feels that the machine is working well for her.  She returns today for an evaluation.  HISTORY  10/09/2018: She reports that on 09/02/2018 she had an episode of not being able to get her words out.  She was not making any sense, she could comprehend what her husband was saying at the time.  She did not end up seeking immediate medical attention as she was afraid to go to the emergency room secondary to the COVID-19 pandemic.  She did sleep and use her CPAP machine at the time, she felt better after waking up.  She had no loss of time as an lack of memory, she had no one-sided weakness or numbness or tingling or droopy face.  She had no twitching or frank staring spell.  She had no actual headache.  As far as vascular risk factors, she does have obesity, a prior suspicion for TIA, type 2 diabetes, hypertension, hyperlipidemia and sleep apnea.  She had blood work recently with her primary care physician which I reviewed.  She was advised to stop her glimepiride.  She has had some lower blood sugar values but typically when she has lows she has associated sweating and jitteriness which she did not have that day.  She admits that she does not drink water very much.  She estimates that she drinks 1 cup of water and 3 bottles of regular Coca-Cola.  She was told years ago that she should not have any sweeteners and also not use any drinks and cans to avoid aluminum  exposure.  Her blood work from 10/04/2018 showed an A1c of 5.9, LDL of 77, mildly elevated triglycerides and she was advised to add fish oil.  She is on an adult aspirin. She had brain imaging studies in 2013 which I reviewed.   I last saw her on 05/31/2018 for sleep apnea follow-up, at which time she was fully compliant with her CPAP machine.  We talked about her sleep study results.  She had a recent interim hospitalization for salmonella enterocolitis and sepsis, complicated by dehydration and acute kidney injury.  The patient's allergies, current medications, family history, past medical history, past social history, past surgical history and problem list were reviewed and updated as appropriate.   REVIEW OF SYSTEMS: Out of a complete 14 system review of symptoms, the patient complains only of the following symptoms, and all other reviewed systems are negative.  [id number]  ALLERGIES: Allergies  Allergen Reactions  . Codeine Itching  . Dilaudid [Hydromorphone] Other (See Comments)    Mouth blisters  . Other Itching    Most pain meds cause itching.  When has to take pain medication, she has been instructed to take Benadryl  . Doxycycline Rash    HOME MEDICATIONS: Outpatient Medications Prior to Visit  Medication Sig Dispense Refill  . acetaminophen (TYLENOL) 325 MG tablet  Take 650 mg by mouth daily.    Marland Kitchen aspirin 325 MG tablet Take 325 mg by mouth every evening.     . Blood Glucose Monitoring Suppl (BLOOD GLUCOSE MONITOR SYSTEM) w/Device KIT Use glucose meter to check blood sugar up to 3 times daily as instructed. One Touch Verio preferred by insurance. Dx: EE11.69 1 each 0  . carvedilol (COREG) 6.25 MG tablet Take 1 tablet (6.25 mg total) by mouth 2 (two) times daily with a meal. 180 tablet 3  . Cholecalciferol (VITAMIN D3) 2000 UNITS capsule Take 2,000 Units by mouth 2 (two) times daily.     . clobetasol ointment (TEMOVATE) 0.05 % APPLY TOPICALLY TWICE DAILY 30 g 0  .  clotrimazole-betamethasone (LOTRISONE) cream Apply topically 2 (two) times daily. 45 g 2  . furosemide (LASIX) 40 MG tablet Take 1 tablet (40 mg total) by mouth daily. 90 tablet 3  . glucose blood test strip One Touch Verio- Use with glucose meter to check blood sugar up to 3 times daily as instructed. Dx: E11.69 100 each 12  . Lancets Misc. KIT Use patient & insurance preferred lancet device to test blood glucose up to 3 times daily for Dx: E11.69 100 each 12  . levothyroxine (SYNTHROID) 175 MCG tablet TAKE ONE TABLET BY MOUTH ONCE DAILY SUNDAY-THURSDAY  (TAKE  150MCG  ON  FRIDAY) 90 tablet 3  . losartan (COZAAR) 25 MG tablet Take 1 tablet (25 mg total) by mouth daily. 90 tablet 3  . Omega-3 Fatty Acids (FISH OIL TRIPLE STRENGTH) 1400 MG CAPS Take 1,000 mg by mouth 4 (four) times daily.     Marland Kitchen omeprazole (PRILOSEC) 20 MG capsule Take 1 capsule (20 mg total) by mouth 2 (two) times daily before a meal. 180 capsule 3  . calcium carbonate (CALCIUM 600) 600 MG TABS tablet Take 600 mg by mouth 2 (two) times daily with a meal.    . diclofenac (VOLTAREN) 75 MG EC tablet Take 1 tablet (75 mg total) by mouth 2 (two) times daily. (Patient not taking: Reported on 06/05/2019) 60 tablet 3  . hydrocortisone (ANUSOL-HC) 2.5 % rectal cream Place 1 application rectally 2 (two) times daily.    . Magnesium 250 MG TABS Take 250 mg by mouth 2 (two) times daily.    . ondansetron (ZOFRAN ODT) 4 MG disintegrating tablet Take 1 tablet (4 mg total) by mouth every 8 (eight) hours as needed for nausea or vomiting. 30 tablet 0  . tiZANidine (ZANAFLEX) 4 MG tablet Take 0.5-1 tablets (2-4 mg total) by mouth every 6 (six) hours as needed for muscle spasms. 90 tablet 3   No facility-administered medications prior to visit.    PAST MEDICAL HISTORY: Past Medical History:  Diagnosis Date  . Duodenal ulcer without hemorrhage or perforation    08-29-2015 per EGD reort  . Fatty liver   . Gastric ulcer without hemorrhage or  perforation    08-29-2015 per EGD report  . Gastritis    per EGD 08-29-2015  . GERD (gastroesophageal reflux disease)   . Hearing loss    no hearing aids  . 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 History of kidney stones   . History of TIA (transient ischemic attack)    03-23-2014  w/ episode temporay  amnesia  . Hyperlipidemia   . Hypertension   . Hypothyroidism   . Iron deficiency anemia   . LAFB (left anterior fascicular block)   . OA (osteoarthritis)   . OSA on CPAP    severe per study 2003  . 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 RBBB (right bundle branch block)   . Seasonal allergies   . Type 2 diabetes mellitus ([place])   . Vitamin D deficiency     PAST SURGICAL HISTORY: Past Surgical History:  Procedure Laterality Date  . CARPAL TUNNEL RELEASE Right 05-10-2007  . CHOLECYSTECTOMY OPEN  1985   and Appendectomy  . CYSTO/  RIGHT URETEROSCOPIC STONE EXTRACTON/  STENT PLACEMENT  06/ 2016   in [place]  . CYSTOSCOPY/RETROGRADE/URETEROSCOPY/STONE EXTRACTION WITH BASKET Right 11/17/2015   Procedure: CYSTOSCOPY/RETROGRADE PYELOGRAM RIGHT/DIAGNOSTIC RIGHT URETEROSCOPY/LASER RENAL CALCIFICATION/RIGHT STENT PLACEMENT;  Surgeon: Cleon Gustin, MD;  Location: [facility];  Service: Urology;  Laterality: Right;  . ESOPHAGOGASTRODUODENOSCOPY N/A 08/29/2015   Procedure: ESOPHAGOGASTRODUODENOSCOPY (EGD);  Surgeon: Danie Binder, MD;  [facility];  Service: Endoscopy;  Laterality: N/A;  . HOLMIUM LASER APPLICATION Right 5/62/1308   Procedure: HOLMIUM LASER APPLICATION;  Surgeon: Cleon Gustin, MD;  Location: [facility];  Service: Urology;  Laterality: Right;  . KNEE  ARTHROSCOPY Right 1999  . LEFT HEEL REPAIR SURGERY  1954;  1985;  1995   polio  . LUMBAR SPINE SURGERY  09-01-2010   L4 -L5 fusion,  L3 laminectomy,  L5 - S1 foraminotomy  . OVARIAN CYST SURGERY Right 1966  . PARATHYROIDECTOMY N/A 05/06/2015   Procedure: PARATHYROIDECTOMY;  Surgeon: Armandina Gemma, MD;  Location: [facility];  Service: General;  Laterality: N/A;   left superior  . TONSILLECTOMY  as child  . TOTAL ABDOMINAL HYSTERECTOMY W/ BILATERAL SALPINGOOPHORECTOMY  1984  . TOTAL KNEE ARTHROPLASTY Right 04-08-2008  . TRANSTHORACIC ECHOCARDIOGRAM  04-07-2012   grade 1 diastolic function,  ef 65-78%/  mild AV calcification without stenosis/  trivial MR    FAMILY HISTORY: Family History  Problem Relation Age of Onset  . Heart failure Mother        No details.  No MI  . Cancer Father        Lung  . Cancer Brother        Lung  . Stroke Maternal Grandfather   . Cancer Paternal Grandmother        rectal  . Cancer Paternal Grandfather        gastric    SOCIAL HISTORY: Social History   Socioeconomic History  . Marital status: Married    Spouse name: Lavell Luster  . Number of children: 4  . Years of education: 13-some college  . Highest education level: High school graduate  Occupational History  . Occupation: Retired  Tobacco Use  . Smoking status: Never Smoker  . Smokeless tobacco: Never Used  Substance and Sexual Activity  . Alcohol use: No  . Drug use: No  . Sexual activity: Not Currently    Birth control/protection: Surgical  Other Topics Concern  . Not on file  Social History Narrative   Lives with husband.  Four children.     Social Determinants of Health   Financial Resource Strain: Low Risk   . Difficulty of Paying Living Expenses: Not hard at all  Food  Insecurity: No Food Insecurity  . Worried About Charity fundraiser in the Last Year: Never true  . Ran Out of Food in the Last Year: Never true  Transportation Needs: No Transportation Needs  . Lack of  Transportation (Medical): No  . Lack of Transportation (Non-Medical): No  Physical Activity: Inactive  . Days of Exercise per Week: 0 days  . Minutes of Exercise per Session: 0 min  Stress: No Stress Concern Present  . Feeling of Stress : Not at all  Social Connections: Not Isolated  . Frequency of Communication with Friends and Family: More than three times a week  . Frequency of Social Gatherings with Friends and Family: More than three times a week  . Attends Religious Services: More than 4 times per year  . Active Member of Clubs or Organizations: Yes  . Attends Archivist Meetings: More than 4 times per year  . Marital Status: Married  Human resources officer Violence: Not At Risk  . Fear of Current or Ex-Partner: No  . Emotionally Abused: No  . Physically Abused: No  . Sexually Abused: No      PHYSICAL EXAM  Vitals:   06/05/19 1354  BP: 116/73  Pulse: 73  Temp: (!) 97 F (36.1 C)  TempSrc: Oral  Weight: 246 lb 9.6 oz (111.9 kg)  Height: '5\' 4"'  (1.626 m)   There is no height or weight on file to calculate BMI.  Generalized: Well developed, in no acute distress  Chest: Lungs clear to auscultation bilaterally  Neurological examination  Mentation: Alert oriented to time, place, history taking. Follows all commands speech and language fluent Cranial nerve II-XII: Extraocular movements were full, visual field were full on confrontational test Head turning and shoulder shrug  were normal and symmetric. Motor: The motor testing reveals 5 over 5 strength of all 4 extremities. Good symmetric motor tone is noted throughout.  Sensory: Sensory testing is intact to soft touch on all 4 extremities. No evidence of extinction is noted.  Gait and station: Gait is normal.    DIAGNOSTIC DATA (LABS, IMAGING, TESTING) - I reviewed patient records, labs, notes, testing and imaging myself where available.  Lab Results  Component Value Date   WBC 8.3 10/04/2018   HGB 13.6  10/04/2018   HCT 40.4 10/04/2018   MCV 89 10/04/2018   PLT 274 10/04/2018      Component Value Date/Time   NA 147 (H) 10/04/2018 1137   K 4.5 10/04/2018 1137   CL 104 10/04/2018 1137   CO2 25 10/04/2018 1137   GLUCOSE 115 (H) 10/04/2018 1137   GLUCOSE 109 (H) 02/27/2018 0531   BUN 19 10/04/2018 1137   CREATININE 0.88 10/04/2018 1137   CREATININE 0.73 10/03/2012 1310   CALCIUM 9.7 10/04/2018 1137   PROT 7.2 10/04/2018 1137   ALBUMIN 4.2 10/04/2018 1137   AST 19 10/04/2018 1137   ALT 24 10/04/2018 1137   ALKPHOS 78 10/04/2018 1137   BILITOT 0.5 10/04/2018 1137   GFRNONAA 64 10/04/2018 1137   GFRNONAA 84 10/03/2012 1310   GFRAA 74 10/04/2018 1137   GFRAA >89 10/03/2012 1310   Lab Results  Component Value Date   CHOL 155 10/04/2018   HDL 34 (L) 10/04/2018   LDLCALC 77 10/04/2018   TRIG 221 (H) 10/04/2018   CHOLHDL 4.6 (H) 10/04/2018   Lab Results  Component Value Date   HGBA1C 5.9 10/04/2018   Lab Results  Component Value Date   VITAMINB12 262 08/28/2015  Lab Results  Component Value Date   TSH 1.050 10/04/2018      ASSESSMENT AND PLAN 79 y.o. year old female  has a past medical history of Duodenal ulcer without hemorrhage or perforation, Fatty liver, Gastric ulcer without hemorrhage or perforation, Gastritis, GERD (gastroesophageal reflux disease), Hearing loss, History of benign parathyroid tumor, History of hepatitis B (?B), History of kidney stones, History of TIA (transient ischemic attack), Hyperlipidemia, Hypertension, Hypothyroidism, Iron deficiency anemia, LAFB (left anterior fascicular block), OA (osteoarthritis), OSA on CPAP, Polio, Post-polio limb muscle weakness, RBBB (right bundle branch block), Seasonal allergies, Type 2 diabetes mellitus ([place]), and Vitamin D deficiency. here with :  1. Obstructive sleep apnea on CPAP  Patient CPAP download shows excellent compliance and good treatment of her apnea.  She is encouraged to continue using CPAP nightly  and greater than 4 hours each night.  She is advised that if her symptoms worsen or she develops new symptoms she should let us know.  She will follow-up in 1 year or sooner if needed     I spent 15 minutes with the patient. 50% of this time was spent reviewing CPAP download   Ward Givens, MSN, NP-C 06/05/2019, 1:54 PM [facility] [address], [place], [place] [address] [phone]  I reviewed the above note and documentation by the Nurse Practitioner and agree with the history, exam, assessment and plan as outlined above. I was available for consultation. [name], MD, PhD [facility])

## 2019-06-05 NOTE — Patient Instructions (Signed)
Continue using CPAP nightly and greater than 4 hours each night °If your symptoms worsen or you develop new symptoms please let us know.  ° °

## 2019-06-13 DIAGNOSIS — Z86010 Personal history of colonic polyps: Secondary | ICD-10-CM | POA: Diagnosis not present

## 2019-06-13 DIAGNOSIS — G4733 Obstructive sleep apnea (adult) (pediatric): Secondary | ICD-10-CM | POA: Diagnosis not present

## 2019-06-13 DIAGNOSIS — Z800 Family history of malignant neoplasm of digestive organs: Secondary | ICD-10-CM | POA: Diagnosis not present

## 2019-06-13 DIAGNOSIS — R198 Other specified symptoms and signs involving the digestive system and abdomen: Secondary | ICD-10-CM | POA: Diagnosis not present

## 2019-06-13 DIAGNOSIS — K219 Gastro-esophageal reflux disease without esophagitis: Secondary | ICD-10-CM | POA: Diagnosis not present

## 2019-06-19 ENCOUNTER — Telehealth: Payer: Self-pay | Admitting: Family Medicine

## 2019-06-20 ENCOUNTER — Ambulatory Visit (INDEPENDENT_AMBULATORY_CARE_PROVIDER_SITE_OTHER): Payer: Medicare Other | Admitting: *Deleted

## 2019-06-20 DIAGNOSIS — I10 Essential (primary) hypertension: Secondary | ICD-10-CM

## 2019-06-20 DIAGNOSIS — I152 Hypertension secondary to endocrine disorders: Secondary | ICD-10-CM

## 2019-06-20 DIAGNOSIS — J302 Other seasonal allergic rhinitis: Secondary | ICD-10-CM

## 2019-06-20 DIAGNOSIS — E1159 Type 2 diabetes mellitus with other circulatory complications: Secondary | ICD-10-CM

## 2019-06-20 NOTE — Patient Instructions (Signed)
Visit Information  Goals Addressed            This Visit's Progress     Patient Stated   . "I want to keep my blood pressure in a normal range" (pt-stated)       Nurse Case Manager Clinical Goal(s): Over the next 90 days, patient's blood pressure will remain within provider recommended parameters.   Interventions:   Discussed current medical management of hypertension  Medications reviewed  Discussed home blood pressure readings. They have been within range.   Encouraged patient to continue to check and record blood pressure several times a week  Advised patient to reach out to PCP at [phone] with any readings outside of recommended range  Patient Self Care Activities:  . Independent self care . Manages medications and takes them on her own . Checks blood pressure with home machine  Please see past updates related to this goal by clicking on the "Past Updates" button in the selected goal         Other   . "I need to schedule an appointment wtih Dr Dettinger"       Current Barriers:  . Chronic Disease Management support and education needs related to hypertension, diabetes,  hyperlipidemia, hypothyroidism.  Nurse Case Manager Clinical Goal(s):  Marland Kitchen Over the next 45 days, patient will have office visit with PCP to follow-up on chronic medical conditions and have labwork  Interventions:  . Talked with patient by phone . Chart reviewed o Last labwork was in 09/2018 and last PCP visit was in 10/2018 . Scheduled appointment for patient to see Dr Dettinger on 08/03/2019 . Advised patient to reach out to PCP at [phone] with any new or worsening symptoms . Provided patient with RN CCM contact information and encouraged her to reach out as needed  Patient Self Care Activities:  . Performs ADL's independently . Performs IADL's independently  Initial goal documentation     . Covid Vaccine       Current Barriers:  Marland Kitchen Knowledge Deficits related to Covid Vaccine  clinics . Low vaccine supply locally  Nurse Case Manager Clinical Goal(s):  Marland Kitchen Over the next 30 days, patient will be able to receive Covid Vaccine  Interventions:  . Talked with patient by telephone . Chart reviewed . Discussed Covid vaccine . Discussed local clinics and [facility] waiting list o Patient advised that she is on a waiting list in [facility]  Patient Self Care Activities:  . Performs ADL's independently . Performs IADL's independently  Initial goal documentation     . Nasal/Sinus Congestion       Current Barriers:  Marland Kitchen Knowledge Deficits related to how to treat nasal/sinus congestion  Nurse Case Manager Clinical Goal(s):  Marland Kitchen Over the next 7 days, patient will see improvement in nasal/sinus symptoms . Over the next 7 days, patient will f/u with PCP office if symptoms fail to improve or worsen  Interventions:  . Chart reviewed . Discussed HPI with patient by telephone o 3 weeks of mostly clear nasal congestion/discharge, sneezing, post-nasal drainage and non-productive cough. Denies fever or chills. Symptoms have not worsened or changed since onset.  o Has taken mucinex with no improvement so she stopped that about a week ago . Recommended to increase water intake. Use saline nasal rinse several times a day and flonase OTC daily. Pseudoephedrine 30mg  1 tablet every 4 to 6 hours PRN congestion.  . Advised to monitor blood pressure more closely while taking pseudoephedrine. If it increases, d/c  med and contact PCP . Advised to schedule an appointment with PCP office if symptoms fail to improve over the next 5 to 7 days or if at any point they worsen   Patient Self Care Activities:  . Patient verbalizes understanding of plan to treat nasal congestion symptoms . Performs ADL's independently . Performs IADL's independently  Initial goal documentation        The care management team will reach out to the patient again over the next 30 days.   Chong Sicilian, BSN,  RN-BC Embedded Chronic Care Manager [facility] [place] [facility] Direct Dial: [phone]   The patient verbalized understanding of instructions provided today and declined a print copy of patient instruction materials.

## 2019-06-20 NOTE — Chronic Care Management (AMB) (Signed)
Chronic Care Management   Follow Up Note   06/20/2019 Name: Karen Potter MRN: [id number] DOB: Jul 08, 1940  Referred by: Dettinger, Fransisca Kaufmann, MD Reason for referral : Chronic Care Management (RN Follow up)   Karen Potter is a 79 y.o. year old female who is a primary care patient of Dettinger, Fransisca Kaufmann, MD. The CCM team was consulted for assistance with chronic disease management and care coordination needs.    Review of patient status, including review of consultants reports, relevant laboratory and other test results, and collaboration with appropriate care team members and the patient's provider was performed as part of comprehensive patient evaluation and provision of chronic care management services.    Outpatient Encounter Medications as of 06/20/2019  Medication Sig Note  . acetaminophen (TYLENOL) 325 MG tablet Take 650 mg by mouth daily.   Marland Kitchen aspirin 325 MG tablet Take 325 mg by mouth every evening.    . Blood Glucose Monitoring Suppl (BLOOD GLUCOSE MONITOR SYSTEM) w/Device KIT Use glucose meter to check blood sugar up to 3 times daily as instructed. One Touch Verio preferred by insurance. 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 carvedilol (COREG) 6.25 MG tablet Take 1 tablet (6.25 mg total) by mouth 2 (two) times daily with a meal.   . Cholecalciferol (VITAMIN D3) 2000 UNITS capsule Take 2,000 Units by mouth 2 (two) times daily.    . clobetasol ointment (TEMOVATE) 0.05 % APPLY TOPICALLY TWICE DAILY   . clotrimazole-betamethasone (LOTRISONE) cream Apply topically 2 (two) times daily.   . furosemide (LASIX) 40 MG tablet Take 1 tablet (40 mg total) by mouth daily.   Marland Kitchen glucose blood test strip One Touch Verio- Use with glucose meter to check blood sugar up to 3 times daily as instructed. Dx: E11.69   . Lancets Misc. KIT Use patient & insurance preferred lancet device to test blood glucose up to 3 times daily for Dx: E11.69   . levothyroxine (SYNTHROID) 175 MCG tablet TAKE ONE TABLET BY MOUTH ONCE DAILY  SUNDAY-THURSDAY  (TAKE  150MCG  ON  FRIDAY)   . losartan (COZAAR) 25 MG tablet Take 1 tablet (25 mg total) by mouth daily.   . Omega-3 Fatty Acids (FISH OIL TRIPLE STRENGTH) 1400 MG CAPS Take 1,000 mg by mouth 4 (four) times daily.  05/06/2017: 1430 EPA, 570 DHA  . omeprazole (PRILOSEC) 20 MG capsule Take 1 capsule (20 mg total) by mouth 2 (two) times daily before a meal.    No facility-administered encounter medications on file as of 06/20/2019.    RN Care Plan           This Visit's Progress     Patient Stated   . "I want to keep my blood pressure in a normal range" (pt-stated)       Nurse Case Manager Clinical Goal(s): Over the next 90 days, patient's blood pressure will remain within provider recommended parameters.   Interventions:   Discussed current medical management of hypertension  Medications reviewed  Discussed home blood pressure readings. They have been within range.   Encouraged patient to continue to check and record blood pressure several times a week  Advised patient to reach out to PCP at [phone] with any readings outside of recommended range  Patient Self Care Activities:  . Independent self care . Manages medications and takes them on her own . Checks blood pressure with home machine  Please see past updates related to this goal by clicking on the "Past Updates" button in the  selected goal       . "I need to schedule an appointment wtih Dr Dettinger"       Current Barriers:  . Chronic Disease Management support and education needs related to hypertension, diabetes,  hyperlipidemia, hypothyroidism.  Nurse Case Manager Clinical Goal(s):  Marland Kitchen Over the next 45 days, patient will have office visit with PCP to follow-up on chronic medical conditions and have labwork  Interventions:  . Talked with patient by phone . Chart reviewed o Last labwork was in 09/2018 and last PCP visit was in 10/2018 . Scheduled appointment for patient to see Dr Dettinger on  08/03/2019 . Advised patient to reach out to PCP at [phone] with any new or worsening symptoms . Provided patient with RN CCM contact information and encouraged her to reach out as needed  Patient Self Care Activities:  . Performs ADL's independently . Performs IADL's independently  Initial goal documentation     . Covid Vaccine       Current Barriers:  Marland Kitchen Knowledge Deficits related to Covid Vaccine clinics . Low vaccine supply locally  Nurse Case Manager Clinical Goal(s):  Marland Kitchen Over the next 30 days, patient will be able to receive Covid Vaccine  Interventions:  . Talked with patient by telephone . Chart reviewed . Discussed Covid vaccine . Discussed local clinics and [facility] waiting list o Patient advised that she is on a waiting list in [facility]  Patient Self Care Activities:  . Performs ADL's independently . Performs IADL's independently  Initial goal documentation     . Nasal/Sinus Congestion       Current Barriers:  Marland Kitchen Knowledge Deficits related to how to treat nasal/sinus congestion  Nurse Case Manager Clinical Goal(s):  Marland Kitchen Over the next 7 days, patient will see improvement in nasal/sinus symptoms . Over the next 7 days, patient will f/u with PCP office if symptoms fail to improve or worsen  Interventions:  . Chart reviewed . Discussed HPI with patient by telephone o 3 weeks of mostly clear nasal congestion/discharge, sneezing, post-nasal drainage and non-productive cough. Denies fever or chills. Symptoms have not worsened or changed since onset.  o Has taken mucinex with no improvement so she stopped that about a week ago . Recommended to increase water intake. Use saline nasal rinse several times a day and flonase OTC daily. Pseudoephedrine 29m 1 tablet every 4 to 6 hours PRN congestion.  . Advised to monitor blood pressure more closely while taking pseudoephedrine. If it increases, d/c med and contact PCP . Advised to schedule an appointment with PCP office  if symptoms fail to improve over the next 5 to 7 days or if at any point they worsen   Patient Self Care Activities:  . Patient verbalizes understanding of plan to treat nasal congestion symptoms . Performs ADL's independently . Performs IADL's independently  Initial goal documentation         Follow-up Plan:   The care management team will reach out to the patient again over the next 30 days.    KChong Sicilian BSN, RN-BC Embedded Chronic Care Manager [facility] Direct Dial: [phone]

## 2019-07-04 IMAGING — MR MRI HEAD WITHOUT CONTRAST
6 of 10 series · 26 of 48 positions shown · non-contrast
Comparison: MR head 04/08/2012.

CLINICAL DATA: BILATERAL weakness for 2 weeks.

EXAM:
MRI HEAD WITHOUT CONTRAST
TECHNIQUE: Multiplanar, multiecho pulse sequences of the brain and surrounding
structures were obtained without intravenous contrast.

[Series 2: DWI · axial · 3.0mm · 0.72mm/px · z∈[-107,+50]mm · 7 of 54 slices shown (1 of 2)]
[im 1/54]
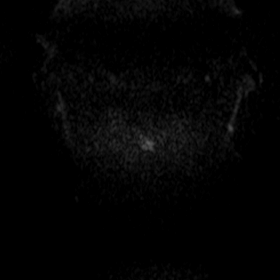
[im 9/54]
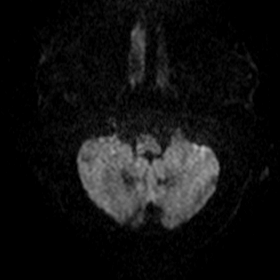
[im 18/54]
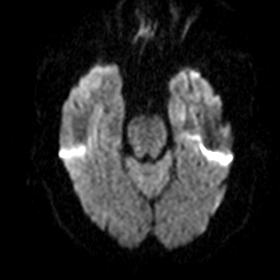
[im 27/54]
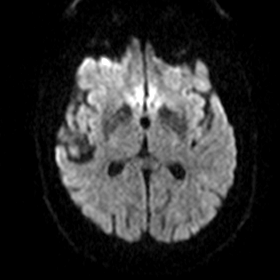
[im 36/54]
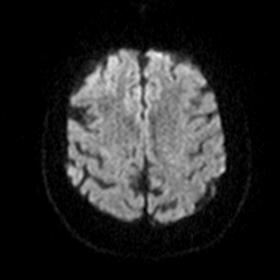
[im 45/54]
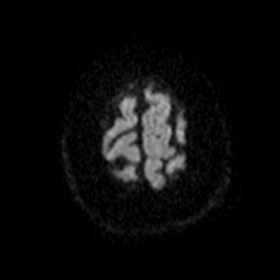
[im 54/54]
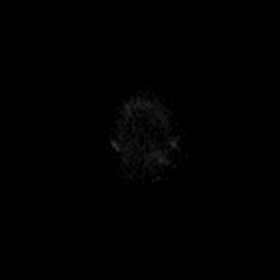

[Series 4: DWI · coronal · 5.0mm · 0.49mm/px · 4 of 38 slices shown (2 of 2)]
[im 1/38]
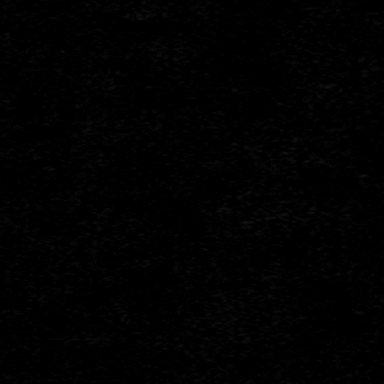
[im 13/38]
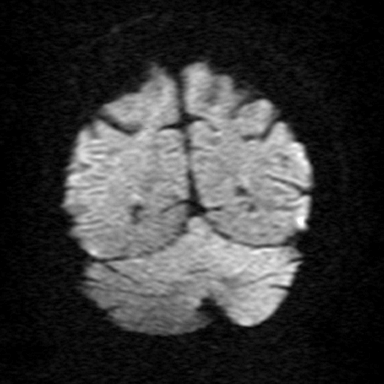
[im 25/38]
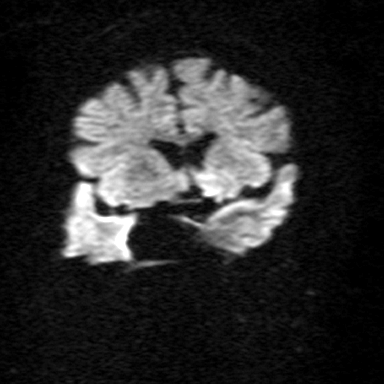
[im 38/38]
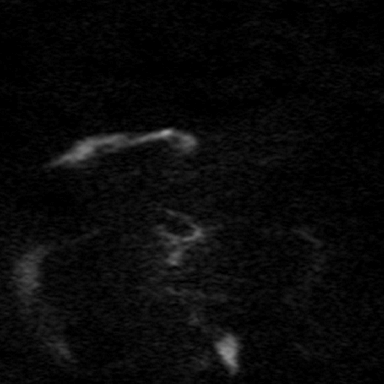

[Series 7: T2 · axial · 5.0mm · 0.48mm/px · z∈[-79,+60]mm · 3 of 23 slices shown (1 of 3)]
[im 1/23]
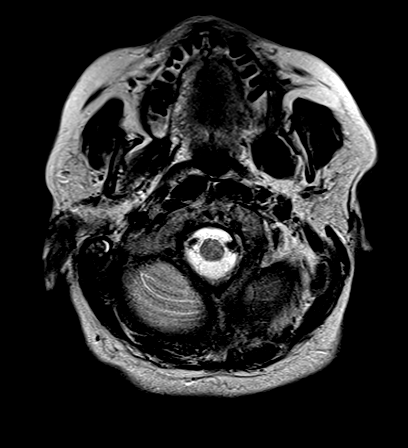
[im 12/23]
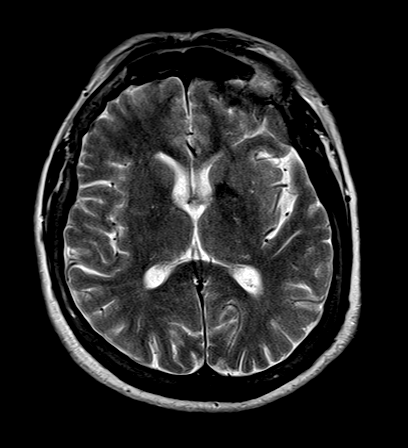
[im 23/23]
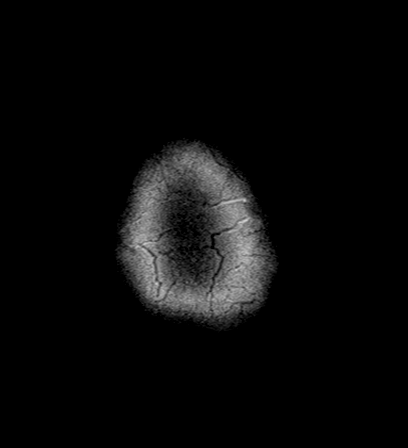

[Series 8: T2 · axial · 4.0mm · 0.43mm/px · z∈[-63,+43]mm · 3 of 23 slices shown (2 of 3)]
[im 1/23]
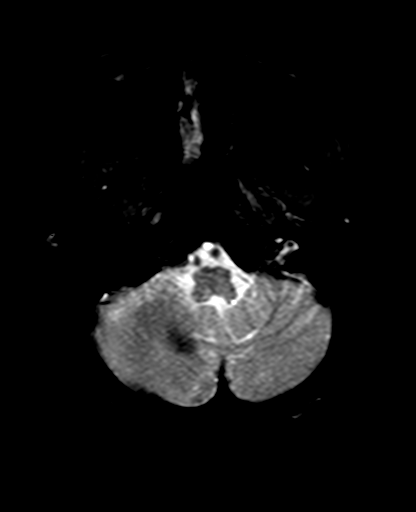
[im 12/23]
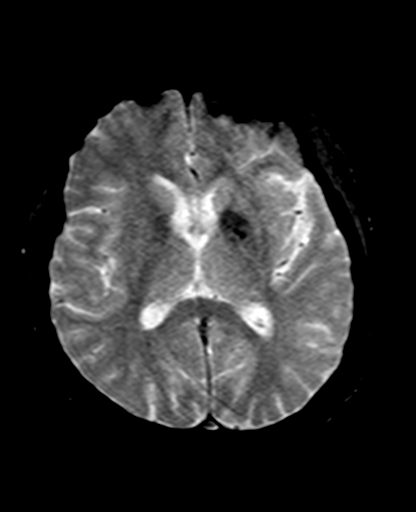
[im 23/23]
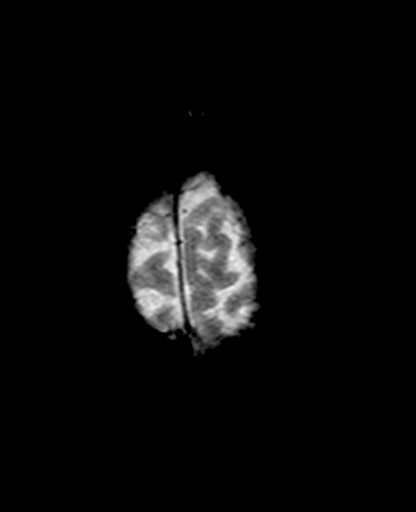

[Series 9: FLAIR · axial · 3.0mm · 0.31mm/px · z∈[-79,+55]mm · 6 of 47 slices shown]
[im 1/47]
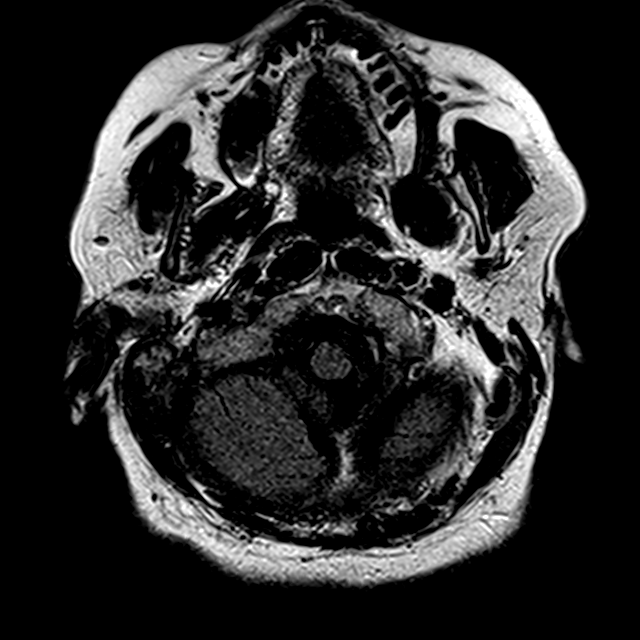
[im 10/47]
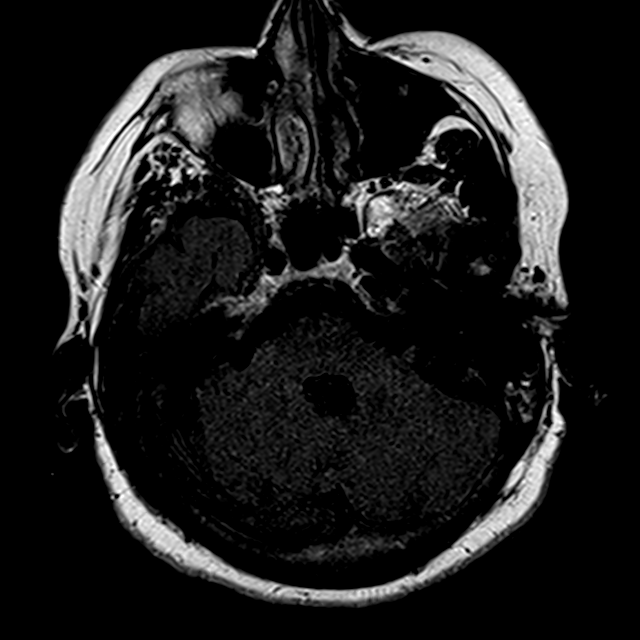
[im 19/47]
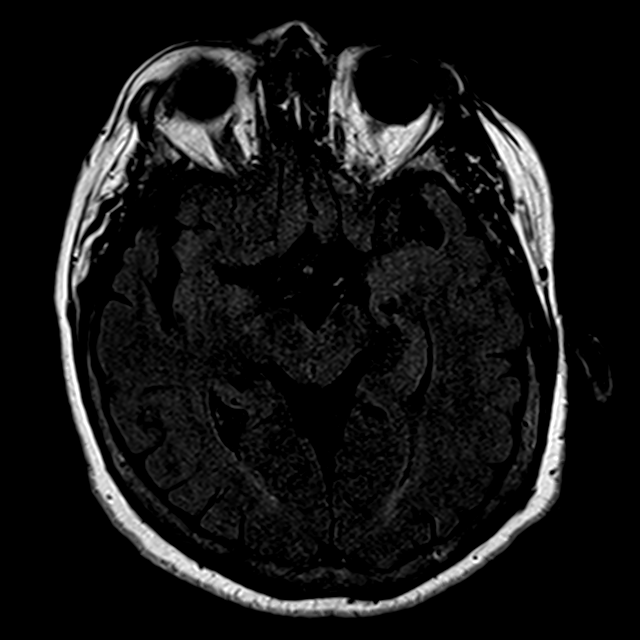
[im 28/47]
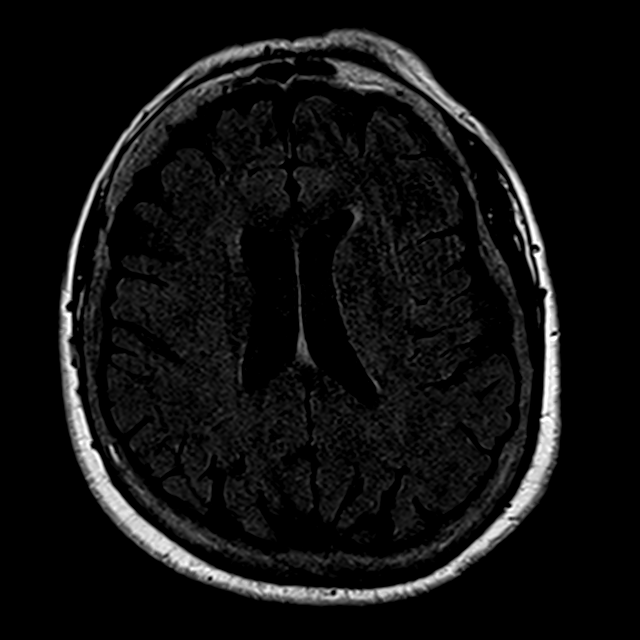
[im 37/47]
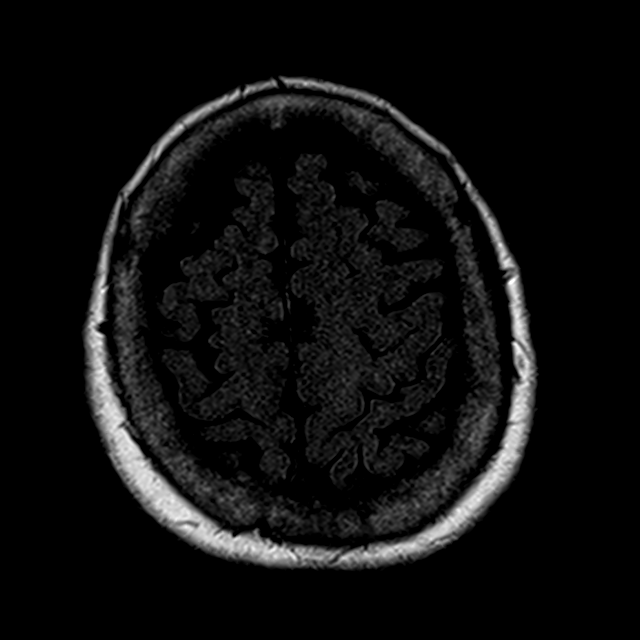
[im 47/47]
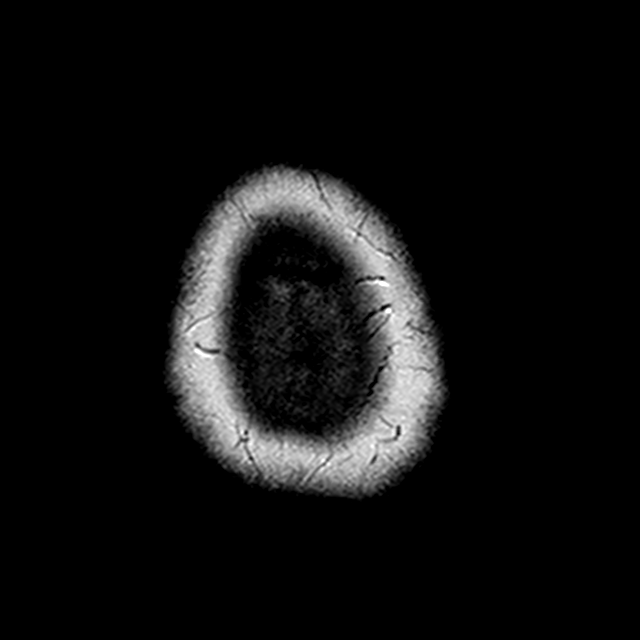

[Series 11: T2 · coronal · 5.0mm · 0.46mm/px · 3 of 28 slices shown (3 of 3)]
[im 1/28]
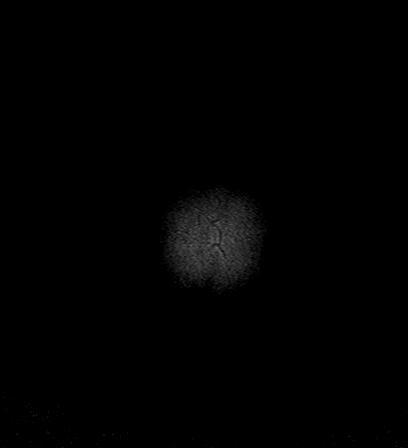
[im 14/28]
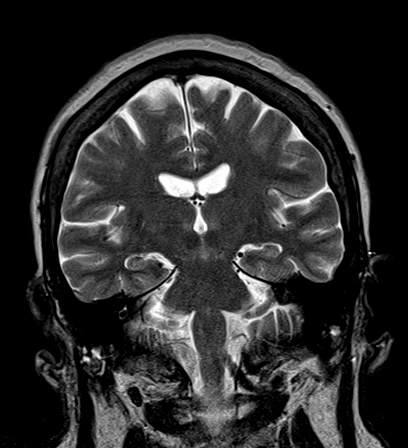
[im 28/28]
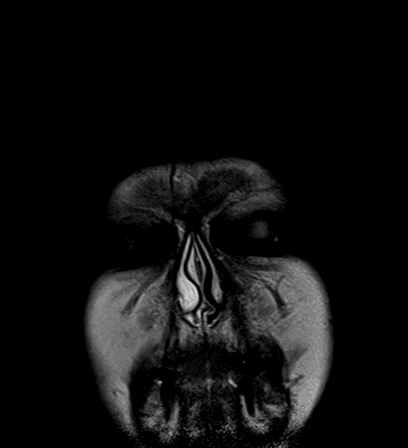

[26 of 48 positions shown; findings below may reference images not displayed]

FINDINGS: Brain: No acute infarction, hemorrhage, hydrocephalus, extra-axial
collection or mass lesion. Normal for age cerebral volume. No
significant white matter disease.

Vascular: Normal flow voids.

Skull and upper cervical spine: Normal marrow signal.

Sinuses/Orbits: Negative.

Other: Small LEFT mastoid effusion.
IMPRESSION: Unremarkable MRI brain. No acute or focal intracranial abnormality.
Similar appearance to priors.

## 2019-07-12 ENCOUNTER — Telehealth: Payer: Medicare Other

## 2019-07-18 ENCOUNTER — Ambulatory Visit: Payer: Medicare Other | Admitting: *Deleted

## 2019-07-18 DIAGNOSIS — I152 Hypertension secondary to endocrine disorders: Secondary | ICD-10-CM

## 2019-07-18 DIAGNOSIS — E1159 Type 2 diabetes mellitus with other circulatory complications: Secondary | ICD-10-CM

## 2019-07-18 NOTE — Patient Instructions (Signed)
Visit Information  Goals Addressed            This Visit's Progress   . COMPLETED: "I need to schedule an appointment wtih Dr Dettinger"       Current Barriers:  . Chronic Disease Management support and education needs related to hypertension, diabetes,  hyperlipidemia, hypothyroidism.  Nurse Case Manager Clinical Goal(s):  Marland Kitchen Over the next 45 days, patient will have office visit with PCP to follow-up on chronic medical conditions and have labwork  Interventions:  . Talked with patient by phone . Chart reviewed o Last labwork was in 09/2018 and last PCP visit was in 10/2018 . Scheduled appointment for patient to see Dr Dettinger on 08/03/2019 . Advised patient to reach out to PCP at [phone] with any new or worsening symptoms . Provided patient with RN CCM contact information and encouraged her to reach out as needed  Patient Self Care Activities:  . Performs ADL's independently . Performs IADL's independently  Initial goal documentation     . Nasal/Sinus Congestion       Nasal/sinus congestion in patient with hypertension and diabetes  Current Barriers:  Marland Kitchen Knowledge Deficits related to how to treat nasal/sinus congestion  Nurse Case Manager Clinical Goal(s):  Marland Kitchen Over the next 7 days, patient will see improvement in nasal/sinus symptoms . Over the next 7 days, patient will f/u with PCP office if symptoms fail to improve or worsen  Interventions:  . Chart reviewed . Reached out to patient by telephone but was unable to speak with her. Left a message requesting a return phone call.  . Previously discussed sinusitis symptoms and OTC treatment . Previously advised to monitor blood pressure more closely while taking pseudoephedrine. If it increases, d/c med and contact PCP . Previously advised to schedule an appointment with PCP office if symptoms fail to improve over the next 5 to 7 days or if at any point they worsen  . RN will reach out to patient again over the next 10  days if no return call has been received  Patient Self Care Activities:  . Patient verbalizes understanding of plan to treat nasal congestion symptoms . Performs ADL's independently . Performs IADL's independently  Please see past updates related to this goal by clicking on the "Past Updates" button in the selected goal         Follow-up Plan The care management team will reach out to the patient again over the next 10 days.   Chong Sicilian, BSN, RN-BC Embedded Chronic Care Manager [facility] [place] [facility] Direct Dial: [phone]

## 2019-07-18 NOTE — Chronic Care Management (AMB) (Addendum)
  Chronic Care Management   Follow Up Note   07/18/2019 Name: Karen Potter MRN: [id number] DOB: [birth year]  Referred by: Dettinger, Fransisca Kaufmann, MD Reason for referral : Chronic Care Management (RN Follow up)   Karen Potter is a 79 y.o. year old female who is a primary care patient of Dettinger, Fransisca Kaufmann, MD. The CCM team was consulted for assistance with chronic disease management and care coordination needs.    Review of patient status, including review of consultants reports, relevant laboratory and other test results, and collaboration with appropriate care team members and the patient's provider was performed as part of comprehensive patient evaluation and provision of chronic care management services.     Unsuccessful outreach for telephone follow-up today.   RN Care Plan  . Nasal/Sinus Congestion       Nasal/sinus congestion in patient with diabetes and hypertension  Current Barriers:  Marland Kitchen Knowledge Deficits related to how to treat nasal/sinus congestion  Nurse Case Manager Clinical Goal(s):  Marland Kitchen Over the next 7 days, patient will see improvement in nasal/sinus symptoms . Over the next 7 days, patient will f/u with PCP office if symptoms fail to improve or worsen  Interventions:  . Chart reviewed . Reached out to patient by telephone but was unable to speak with her. Left a message requesting a return phone call.  . Previously discussed sinusitis symptoms and OTC treatment . Previously advised to monitor blood pressure more closely while taking pseudoephedrine. If it increases, d/c med and contact PCP . Previously advised to schedule an appointment with PCP office if symptoms fail to improve over the next 5 to 7 days or if at any point they worsen  . RN will reach out to patient again over the next 10 days if no return call has been received  Patient Self Care Activities:  . Patient verbalizes understanding of plan to treat nasal congestion symptoms . Performs ADL's  independently . Performs IADL's independently  Please see past updates related to this goal by clicking on the "Past Updates" button in the selected goal          Plan:   The care management team will reach out to the patient again over the next 10 days.    Chong Sicilian, BSN, RN-BC Embedded Chronic Care Manager [facility] [place] [facility] Direct Dial: [phone]

## 2019-07-24 ENCOUNTER — Ambulatory Visit: Payer: Medicare Other | Admitting: *Deleted

## 2019-07-25 NOTE — Chronic Care Management (AMB) (Signed)
  Chronic Care Management   Outreach Note  07/24/2019 Name: Karen Potter MRN: [id number] DOB: 1940/10/21  Referred by: Dettinger, Fransisca Kaufmann, MD Reason for referral : Chronic Care Management   An unsuccessful telephone outreach was attempted today. The patient was referred to the case management team for assistance with care management and care coordination.   Follow Up Plan: A HIPPA compliant phone message was left for the patient providing contact information and requesting a return call.  The care management team will reach out to the patient again over the next 30 days.   Chong Sicilian, BSN, RN-BC Embedded Chronic Care Manager [facility] [place] [facility] Direct Dial: [phone]

## 2019-07-26 ENCOUNTER — Encounter: Payer: Self-pay | Admitting: Family Medicine

## 2019-07-26 ENCOUNTER — Other Ambulatory Visit: Payer: Self-pay

## 2019-07-26 ENCOUNTER — Ambulatory Visit (INDEPENDENT_AMBULATORY_CARE_PROVIDER_SITE_OTHER): Payer: Medicare Other | Admitting: Family Medicine

## 2019-07-26 VITALS — BP 121/81 | HR 76 | Temp 98.4°F | Ht 64.0 in | Wt 241.4 lb

## 2019-07-26 DIAGNOSIS — M778 Other enthesopathies, not elsewhere classified: Secondary | ICD-10-CM

## 2019-07-26 DIAGNOSIS — E1169 Type 2 diabetes mellitus with other specified complication: Secondary | ICD-10-CM | POA: Diagnosis not present

## 2019-07-26 DIAGNOSIS — M722 Plantar fascial fibromatosis: Secondary | ICD-10-CM

## 2019-07-26 MED ORDER — PREDNISONE 10 MG PO TABS: ORAL_TABLET | ORAL | 0 refills | Status: DC

## 2019-07-26 NOTE — Progress Notes (Signed)
Subjective:  Patient ID: Karen Potter, female    DOB: 08/07/1940  Age: 79 y.o. MRN: [id number]  CC: Arm Pain (left elbow down pain x 2 weeks, no known injury) and Foot Pain (left, acute, no known injury)   HPI Karen Potter presents for pain at the left arm points to the ventral left forearm just distal to the antecubital fossa.  Additionally she says she has been awakening with left heel pain it is causing problems with her walking worsening through the day.  She has no known injuries.  Pain can be 5-6/10 at the elbow.  7-8/10 at times when ambulating for the foot.  The pain in the foot is located near the heel at the midfoot plantar surface.  Depression screen [facility] 2/9 07/26/2019 11/02/2018 10/12/2018  Decreased Interest 0 0 0  Down, Depressed, Hopeless 0 0 0  PHQ - 2 Score 0 0 0  Altered sleeping - - -  Tired, decreased energy - - -  Change in appetite - - -  Feeling bad or failure about yourself  - - -  Trouble concentrating - - -  Moving slowly or fidgety/restless - - -  Suicidal thoughts - - -  PHQ-9 Score - - -  Difficult doing work/chores - - -  Some recent data might be hidden    History Villa has a past medical history of Duodenal ulcer without hemorrhage or perforation, Fatty liver, Gastric ulcer without hemorrhage or perforation, Gastritis, GERD (gastroesophageal reflux disease), Hearing loss, History of benign parathyroid tumor, 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   She has a past surgical history that includes Carpal tunnel release (Right, 05-10-2007); Parathyroidectomy (N/A, 05/06/2015); Esophagogastroduodenoscopy  (N/A, 08/29/2015); Ovarian cyst surgery (Right, 1966); Total abdominal hysterectomy w/ bilateral salpingoophorectomy (1984); Total knee arthroplasty (Right, 04-08-2008); Knee arthroscopy (Right, 1999); Lumbar spine surgery (09-01-2010); transthoracic echocardiogram (04-07-2012); Tonsillectomy (as child); Cholecystectomy open (1985); LEFT HEEL REPAIR SURGERY (1954;  1985;  1995); CYSTO/  RIGHT URETEROSCOPIC STONE EXTRACTON/  STENT PLACEMENT (06/ 2016   in [place]); Cystoscopy/retrograde/ureteroscopy/stone extraction with basket (Right, 11/17/2015); and Holmium laser application (Right, 7/56/4332).   Her family history includes Cancer in her brother, father, paternal grandfather, and paternal grandmother; Heart failure in her mother; Stroke in her maternal grandfather.She reports that she has never smoked. She has never used smokeless tobacco. She reports that she does not drink alcohol or use drugs.    ROS Review of Systems  Constitutional: Negative for fever.  HENT: Negative for congestion, rhinorrhea and sore throat.   Respiratory: Negative for cough and shortness of breath.   Cardiovascular: Negative for chest pain and palpitations.  Gastrointestinal: Negative for abdominal pain.  Musculoskeletal: Negative for arthralgias and myalgias.    Objective:  BP 121/81   Pulse 76   Temp 98.4 F (36.9 C)   Ht _0  (1.626 m)   Wt 241 lb 6.4 oz (109.5 kg)   BMI 41.44 kg/m   BP Readings from Last 3 Encounters:  07/26/19 121/81  06/05/19 116/73  11/02/18 125/76    Wt Readings from Last 3 Encounters:  07/26/19 241 lb 6.4 oz (109.5 kg)  06/05/19 246 lb 9.6 oz (111.9 kg)  11/02/18 249 lb (112.9 kg)  Physical Exam Constitutional:      Appearance: She is well-developed.  HENT:     Head: Normocephalic.  Cardiovascular:     Rate and Rhythm: Normal rate and regular rhythm.     Heart sounds: No murmur.  Pulmonary:     Effort: Pulmonary effort is normal.     Breath sounds: Normal breath  sounds.  Musculoskeletal:        General: Tenderness (At the plantar fascial origin of the left foot and at the proximal ventral forearm of the left upper extremity.) present.       Assessment & Plan:   Shantia was seen today for arm pain and foot pain.  Diagnoses and all orders for this visit:  Plantar fasciitis of left foot -     predniSONE (DELTASONE) 10 MG tablet; Take 5 daily for 3 days followed by 4,3,2 and 1 for 3 days each.  Tendinitis of left forearm -     predniSONE (DELTASONE) 10 MG tablet; Take 5 daily for 3 days followed by 4,3,2 and 1 for 3 days each.  Type 2 diabetes mellitus with other specified complication, without long-term current use of insulin (HCC)    Patient has classic signs of plantar fasciitis.  Prednisone may calm it down but she is going to need to stretch the foot.  Stretching exercises recommended but she may need physical therapy if symptoms continue in spite of the prednisone.  The elbow/forearm has apparent tendinitis which should respond to the prednisone as well.  We discussed that this may impact her diabetes.  She will probably see her blood sugar climb.  However it should come back come safely as soon as she finishes her course of prednisone and hopefully will see improvement in the heel and forearm as well.  Meanwhile she can apply ice to either area.  Soaking the foot in hot water with Epsom salts would be soothing as well.   I am having Jari Sportsman start on predniSONE. I am also having her maintain her Vitamin D3, Fish Oil Triple Strength, omeprazole, aspirin, Blood Glucose Monitor System, glucose blood, Lancets Misc., clotrimazole-betamethasone, losartan, carvedilol, furosemide, levothyroxine, clobetasol ointment, and acetaminophen.  Allergies as of 07/26/2019      Reactions   Codeine Itching   Dilaudid [hydromorphone] Other (See Comments)   Mouth blisters   Other Itching   Most pain meds cause itching.  When has to take pain  medication, she has been instructed to take Benadryl   Doxycycline Rash      Medication List       Accurate as of July 26, 2019 11:59 PM. If you have any questions, ask your nurse or doctor.        acetaminophen 325 MG tablet Commonly known as: TYLENOL Take 650 mg by mouth daily.   aspirin 325 MG tablet Take 325 mg by mouth every evening.   Blood Glucose Monitor System w/Device Kit Use glucose meter to check blood sugar up to 3 times daily as instructed. One Touch Verio preferred by insurance. 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   clobetasol ointment 0.05 % Commonly known as: TEMOVATE APPLY TOPICALLY TWICE DAILY   clotrimazole-betamethasone cream Commonly known as: LOTRISONE Apply topically 2 (two) times daily.   Fish Oil Triple Strength 1400 MG Caps Take 1,000 mg by mouth 4 (four) times daily.   furosemide 40 MG  tablet Commonly known as: LASIX Take 1 tablet (40 mg total) by mouth daily.   glucose blood test strip One Touch Verio- Use with glucose meter to check blood sugar up to 3 times daily as instructed. Dx: E11.69   Lancets Misc. Kit Use patient & insurance preferred lancet device to test blood glucose up to 3 times daily for Dx: E11.69   levothyroxine 175 MCG tablet Commonly known as: SYNTHROID TAKE ONE TABLET BY MOUTH ONCE DAILY SUNDAY-THURSDAY  (TAKE  150MCG  ON  FRIDAY)   losartan 25 MG tablet Commonly known as: Cozaar Take 1 tablet (25 mg total) by mouth daily.   omeprazole 20 MG capsule Commonly known as: PRILOSEC Take 1 capsule (20 mg total) by mouth 2 (two) times daily before a meal.   predniSONE 10 MG tablet Commonly known as: DELTASONE Take 5 daily for 3 days followed by 4,3,2 and 1 for 3 days each. Started by: Claretta Fraise, MD   Vitamin D3 50 MCG (2000 UT) capsule Take 2,000 Units by mouth 2 (two) times daily.        Follow-up:  Return in about 1 month (around 08/26/2019), or if symptoms worsen or fail to improve, for Pain.  Claretta Fraise, M.D.

## 2019-07-27 ENCOUNTER — Encounter: Payer: Self-pay | Admitting: Family Medicine

## 2019-08-03 ENCOUNTER — Other Ambulatory Visit: Payer: Self-pay

## 2019-08-03 ENCOUNTER — Encounter: Payer: Self-pay | Admitting: Family Medicine

## 2019-08-03 ENCOUNTER — Ambulatory Visit (INDEPENDENT_AMBULATORY_CARE_PROVIDER_SITE_OTHER): Payer: Medicare Other | Admitting: Family Medicine

## 2019-08-03 VITALS — BP 152/79 | HR 55 | Temp 97.8°F | Ht 64.0 in | Wt 236.0 lb

## 2019-08-03 DIAGNOSIS — I152 Hypertension secondary to endocrine disorders: Secondary | ICD-10-CM

## 2019-08-03 DIAGNOSIS — K219 Gastro-esophageal reflux disease without esophagitis: Secondary | ICD-10-CM | POA: Diagnosis not present

## 2019-08-03 DIAGNOSIS — E1169 Type 2 diabetes mellitus with other specified complication: Secondary | ICD-10-CM

## 2019-08-03 DIAGNOSIS — I10 Essential (primary) hypertension: Secondary | ICD-10-CM

## 2019-08-03 DIAGNOSIS — E039 Hypothyroidism, unspecified: Secondary | ICD-10-CM

## 2019-08-03 DIAGNOSIS — E785 Hyperlipidemia, unspecified: Secondary | ICD-10-CM

## 2019-08-03 DIAGNOSIS — I7300 Raynaud's syndrome without gangrene: Secondary | ICD-10-CM | POA: Diagnosis not present

## 2019-08-03 DIAGNOSIS — E1159 Type 2 diabetes mellitus with other circulatory complications: Secondary | ICD-10-CM

## 2019-08-03 LAB — BAYER DCA HB A1C WAIVED: HB A1C (BAYER DCA - WAIVED): 6.8 % (ref <7.0)

## 2019-08-03 NOTE — Progress Notes (Signed)
BP (!) 152/79   Pulse (!) 55   Temp 97.8 F (36.6 C)   Ht '5\' 4"'  (1.626 m)   Wt 236 lb (107 kg)   SpO2 97%   BMI 40.51 kg/m    Subjective:   Patient ID: Karen Potter, female    DOB: [birth year], 79 y.o.   MRN: [id number]  HPI: Karen Potter is a 79 y.o. female presenting on 08/03/2019 for Medical Management of Chronic Issues and Diabetes   HPI Type 2 diabetes mellitus Patient comes in today for recheck of his diabetes. Patient has been currently taking no medication currently although she is concerned that Effexor is making it because she is been on prednisone for the past couple weeks for a taper to help with tendinitis. Patient is currently on an ACE inhibitor/ARB. Patient has not seen an ophthalmologist this year. Patient denies any issues with their feet.   Hypertension Patient is currently on furosemide and carvedilol and losartan, and their blood pressure today is 152/79 but may be related to the prednisone.. Patient denies any lightheadedness or dizziness. Patient denies headaches, blurred vision, chest pains, shortness of breath, or weakness. Denies any side effects from medication and is content with current medication.   Hypothyroidism recheck Patient is coming in for thyroid recheck today as well. They deny any issues with hair changes or heat or cold problems or diarrhea or constipation. They deny any chest pain or palpitations. They are currently on levothyroxine 175 micrograms  Patient is on prednisone from tendinitis and says that it is much better.  Patient was told that she might have intermittent Raynaud's syndrome where her fingers turn white when she gets into colder places, its not happening very often but we will monitor.  Relevant past medical, surgical, family and social history reviewed and updated as indicated. Interim medical history since our last visit reviewed. Allergies and medications reviewed and updated.  Review of Systems  Constitutional:  Negative for chills and fever.  Eyes: Negative for visual disturbance.  Respiratory: Negative for chest tightness and shortness of breath.   Cardiovascular: Negative for chest pain and leg swelling.  Musculoskeletal: Negative for back pain and gait problem.  Skin: Negative for rash.  Neurological: Negative for dizziness, light-headedness and headaches.  Psychiatric/Behavioral: Negative for agitation and behavioral problems.  All other systems reviewed and are negative.   Per HPI unless specifically indicated above   Allergies as of 08/03/2019      Reactions   Codeine Itching   Dilaudid [hydromorphone] Other (See Comments)   Mouth blisters   Other Itching   Most pain meds cause itching.  When has to take pain medication, she has been instructed to take Benadryl   Doxycycline Rash      Medication List       Accurate as of August 03, 2019  8:49 AM. If you have any questions, ask your nurse or doctor.        STOP taking these medications   acetaminophen 325 MG tablet Commonly known as: TYLENOL Stopped by: Fransisca Kaufmann , MD     TAKE these medications   aspirin 325 MG tablet Take 325 mg by mouth every evening.   Blood Glucose Monitor System w/Device Kit Use glucose meter to check blood sugar up to 3 times daily as instructed. One Touch Verio preferred by insurance. Dx: EE11.69   carvedilol 6.25 MG tablet Commonly known as: COREG Take 1 tablet (6.25 mg total) by mouth 2 (two) times daily  with a meal.   clobetasol ointment 0.05 % Commonly known as: TEMOVATE APPLY TOPICALLY TWICE DAILY   clotrimazole-betamethasone cream Commonly known as: LOTRISONE Apply topically 2 (two) times daily.   Fish Oil Triple Strength 1400 MG Caps Take 1,000 mg by mouth 4 (four) times daily.   furosemide 40 MG tablet Commonly known as: LASIX Take 1 tablet (40 mg total) by mouth daily.   glucose blood test strip One Touch Verio- Use with glucose meter to check blood sugar up to 3  times daily as instructed. Dx: E11.69   Lancets Misc. Kit Use patient & insurance preferred lancet device to test blood glucose up to 3 times daily for Dx: E11.69   levothyroxine 175 MCG tablet Commonly known as: SYNTHROID TAKE ONE TABLET BY MOUTH ONCE DAILY SUNDAY-THURSDAY  (TAKE  150MCG  ON  FRIDAY)   losartan 25 MG tablet Commonly known as: Cozaar Take 1 tablet (25 mg total) by mouth daily.   omeprazole 20 MG capsule Commonly known as: PRILOSEC Take 1 capsule (20 mg total) by mouth 2 (two) times daily before a meal.   predniSONE 10 MG tablet Commonly known as: DELTASONE Take 5 daily for 3 days followed by 4,3,2 and 1 for 3 days each.   Vitamin D3 50 MCG (2000 UT) capsule Take 2,000 Units by mouth 2 (two) times daily.        Objective:   BP (!) 152/79   Pulse (!) 55   Temp 97.8 F (36.6 C)   Ht '5\' 4"'  (1.626 m)   Wt 236 lb (107 kg)   SpO2 97%   BMI 40.51 kg/m   Wt Readings from Last 3 Encounters:  08/03/19 236 lb (107 kg)  07/26/19 241 lb 6.4 oz (109.5 kg)  06/05/19 246 lb 9.6 oz (111.9 kg)    Physical Exam Vitals and nursing note reviewed.  Constitutional:      General: She is not in acute distress.    Appearance: She is well-developed. She is not diaphoretic.  Eyes:     Conjunctiva/sclera: Conjunctivae normal.  Cardiovascular:     Rate and Rhythm: Normal rate and regular rhythm.     Heart sounds: Normal heart sounds. No murmur.  Pulmonary:     Effort: Pulmonary effort is normal. No respiratory distress.     Breath sounds: Normal breath sounds. No wheezing.  Musculoskeletal:        General: No tenderness. Normal range of motion.  Skin:    General: Skin is warm and dry.     Findings: No rash.  Neurological:     Mental Status: She is alert and oriented to person, place, and time.     Coordination: Coordination normal.  Psychiatric:        Behavior: Behavior normal.    Diabetic Foot Exam - Simple   Simple Foot Form Diabetic Foot exam was performed  with the following findings: Yes 08/03/2019  8:48 AM  Visual Inspection No deformities, no ulcerations, no other skin breakdown bilaterally: Yes Sensation Testing Intact to touch and monofilament testing bilaterally: Yes Pulse Check Posterior Tibialis and Dorsalis pulse intact bilaterally: Yes Comments     Results for orders placed or performed in visit on 11/30/18  HM DIABETES EYE EXAM  Result Value Ref Range   HM Diabetic Eye Exam No Retinopathy No Retinopathy    Assessment & Plan:   Problem List Items Addressed This Visit      Cardiovascular and Mediastinum   Hypertension associated with diabetes ([place])  Relevant Orders   CBC with Differential/Platelet     Digestive   GERD (gastroesophageal reflux disease) (Chronic)     Endocrine   Hyperlipidemia associated with type 2 diabetes mellitus ([place])   Relevant Orders   Lipid panel   Hypothyroid   Relevant Orders   TSH   Type 2 diabetes mellitus with other specified complication ([place]) - Primary   Relevant Orders   Bayer DCA Hb A1c Waived   CMP14+EGFR    Other Visit Diagnoses    Raynaud's disease without gangrene          Continue current medication.  No changes, will check glucose and if elevated she may need to restart her diabetes medications for a short time.  While she is on prednisone. Follow up plan: Return in about 3 months (around 11/03/2019), or if symptoms worsen or fail to improve, for Thyroid and diabetes and hypertension recheck.  Counseling provided for all of the vaccine components Orders Placed This Encounter  Procedures  . Bayer DCA Hb A1c Waived  . CBC with Differential/Platelet  . CMP14+EGFR  . Lipid panel  . TSH    Caryl Pina, MD [facility] 08/03/2019, 8:49 AM

## 2019-08-04 LAB — CBC WITH DIFFERENTIAL/PLATELET
Basophils Absolute: 0.1 10*3/uL (ref 0.0–0.2)
Basos: 0 %
EOS (ABSOLUTE): 0.1 10*3/uL (ref 0.0–0.4)
Eos: 0 %
Hematocrit: 39.5 % (ref 34.0–46.6)
Hemoglobin: 13.5 g/dL (ref 11.1–15.9)
Immature Grans (Abs): 0.3 10*3/uL — ABNORMAL HIGH (ref 0.0–0.1)
Immature Granulocytes: 2 %
Lymphocytes Absolute: 4.7 10*3/uL — ABNORMAL HIGH (ref 0.7–3.1)
Lymphs: 25 %
MCH: 30.4 pg (ref 26.6–33.0)
MCHC: 34.2 g/dL (ref 31.5–35.7)
MCV: 89 fL (ref 79–97)
Monocytes Absolute: 1.9 10*3/uL — ABNORMAL HIGH (ref 0.1–0.9)
Monocytes: 10 %
Neutrophils Absolute: 11.9 10*3/uL — ABNORMAL HIGH (ref 1.4–7.0)
Neutrophils: 63 %
Platelets: 311 10*3/uL (ref 150–450)
RBC: 4.44 x10E6/uL (ref 3.77–5.28)
RDW: 13.3 % (ref 11.7–15.4)
WBC: 18.9 10*3/uL — ABNORMAL HIGH (ref 3.4–10.8)

## 2019-08-04 LAB — CMP14+EGFR
ALT: 39 IU/L — ABNORMAL HIGH (ref 0–32)
AST: 18 IU/L (ref 0–40)
Albumin/Globulin Ratio: 1.3 (ref 1.2–2.2)
Albumin: 3.8 g/dL (ref 3.7–4.7)
Alkaline Phosphatase: 95 IU/L (ref 39–117)
BUN/Creatinine Ratio: 22 (ref 12–28)
BUN: 19 mg/dL (ref 8–27)
Bilirubin Total: 0.5 mg/dL (ref 0.0–1.2)
CO2: 25 mmol/L (ref 20–29)
Calcium: 9 mg/dL (ref 8.7–10.3)
Chloride: 101 mmol/L (ref 96–106)
Creatinine, Ser: 0.85 mg/dL (ref 0.57–1.00)
GFR calc Af Amer: 76 mL/min/{1.73_m2} (ref >59)
GFR calc non Af Amer: 66 mL/min/{1.73_m2} (ref >59)
Globulin, Total: 3 g/dL (ref 1.5–4.5)
Glucose: 114 mg/dL — ABNORMAL HIGH (ref 65–99)
Potassium: 3.6 mmol/L (ref 3.5–5.2)
Sodium: 141 mmol/L (ref 134–144)
Total Protein: 6.8 g/dL (ref 6.0–8.5)

## 2019-08-04 LAB — LIPID PANEL
Chol/HDL Ratio: 2.2 ratio (ref 0.0–4.4)
Cholesterol, Total: 100 mg/dL (ref 100–199)
HDL: 46 mg/dL (ref >39)
LDL Chol Calc (NIH): 35 mg/dL (ref 0–99)
Triglycerides: 97 mg/dL (ref 0–149)
VLDL Cholesterol Cal: 19 mg/dL (ref 5–40)

## 2019-08-04 LAB — TSH: TSH: 0.147 u[IU]/mL — ABNORMAL LOW (ref 0.450–4.500)

## 2019-08-06 ENCOUNTER — Telehealth: Payer: Medicare Other

## 2019-08-15 ENCOUNTER — Other Ambulatory Visit: Payer: Self-pay

## 2019-08-15 ENCOUNTER — Ambulatory Visit (INDEPENDENT_AMBULATORY_CARE_PROVIDER_SITE_OTHER): Payer: Medicare Other | Admitting: Family Medicine

## 2019-08-15 ENCOUNTER — Encounter: Payer: Self-pay | Admitting: Family Medicine

## 2019-08-15 VITALS — BP 105/62 | HR 79 | Temp 98.6°F | Ht 64.0 in | Wt 239.0 lb

## 2019-08-15 DIAGNOSIS — R079 Chest pain, unspecified: Secondary | ICD-10-CM | POA: Diagnosis not present

## 2019-08-15 DIAGNOSIS — M940 Chondrocostal junction syndrome [Tietze]: Secondary | ICD-10-CM

## 2019-08-15 MED ORDER — TIZANIDINE HCL 2 MG PO TABS: 2.0000 mg | ORAL_TABLET | Freq: Three times a day (TID) | ORAL | 0 refills | Status: DC | PRN

## 2019-08-15 NOTE — Patient Instructions (Signed)
Voltaren Gel - four times a day as needed.    Costochondritis  Costochondritis is swelling and irritation (inflammation) of the tissue (cartilage) that connects your ribs to your breastbone (sternum). This causes pain in the front of your chest. The pain usually starts gradually and involves more than one rib. What are the causes? The exact cause of this condition is not always known. It results from stress on the cartilage where your ribs attach to your sternum. The cause of this stress could be:  Chest injury (trauma).  Exercise or activity, such as lifting.  Severe coughing. What increases the risk? You may be at higher risk for this condition if you:  Are female.  Are 9?78 years old.  Recently started a new exercise or work activity.  Have low levels of vitamin D.  Have a condition that makes you cough frequently. What are the signs or symptoms? The main symptom of this condition is chest pain. The pain:  Usually starts gradually and can be sharp or dull.  Gets worse with deep breathing, coughing, or exercise.  Gets better with rest.  May be worse when you press on the sternum-rib connection (tenderness). How is this diagnosed? This condition is diagnosed based on your symptoms, medical history, and a physical exam. Your health care provider will check for tenderness when pressing on your sternum. This is the most important finding. You may also have tests to rule out other causes of chest pain. These may include:  A chest X-ray to check for lung problems.  An electrocardiogram (ECG) to see if you have a heart problem that could be causing the pain.  An imaging scan to rule out a chest or rib fracture. How is this treated? This condition usually goes away on its own over time. Your health care provider may prescribe an NSAID to reduce pain and inflammation. Your health care provider may also suggest that you:  Rest and avoid activities that make pain  worse.  Apply heat or cold to the area to reduce pain and inflammation.  Do exercises to stretch your chest muscles. If these treatments do not help, your health care provider may inject a numbing medicine at the sternum-rib connection to help relieve the pain. Follow these instructions at home:  Avoid activities that make pain worse. This includes any activities that use chest, abdominal, and side muscles.  If directed, put ice on the painful area: ? Put ice in a plastic bag. ? Place a towel between your skin and the bag. ? Leave the ice on for 20 minutes, 2-3 times a day.  If directed, apply heat to the affected area as often as told by your health care provider. Use the heat source that your health care provider recommends, such as a moist heat pack or a heating pad. ? Place a towel between your skin and the heat source. ? Leave the heat on for 20-30 minutes. ? Remove the heat if your skin turns bright red. This is especially important if you are unable to feel pain, heat, or cold. You may have a greater risk of getting burned.  Take over-the-counter and prescription medicines only as told by your health care provider.  Return to your normal activities as told by your health care provider. Ask your health care provider what activities are safe for you.  Keep all follow-up visits as told by your health care provider. This is important. Contact a health care provider if:  You have chills or  a fever.  Your pain does not go away or it gets worse.  You have a cough that does not go away (is persistent). Get help right away if:  You have shortness of breath. This information is not intended to replace advice given to you by your health care provider. Make sure you discuss any questions you have with your health care provider. Document Revised: 05/11/2017 Document Reviewed: 08/20/2015 Elsevier Patient Education  2020 [place] [employer].    Back Exercises These exercises help to make  your trunk and back strong. They also help to keep the lower back flexible. Doing these exercises can help to prevent back pain or lessen existing pain.  If you have back pain, try to do these exercises 2-3 times each day or as told by your doctor.  As you get better, do the exercises once each day. Repeat the exercises more often as told by your doctor.  To stop back pain from coming back, do the exercises once each day, or as told by your doctor. Exercises Single knee to chest Do these steps 3-5 times in a row for each leg: 1. Lie on your back on a firm bed or the floor with your legs stretched out. 2. Bring one knee to your chest. 3. Grab your knee or thigh with both hands and hold them it in place. 4. Pull on your knee until you feel a gentle stretch in your lower back or buttocks. 5. Keep doing the stretch for 10-30 seconds. 6. Slowly let go of your leg and straighten it. Pelvic tilt Do these steps 5-10 times in a row: 1. Lie on your back on a firm bed or the floor with your legs stretched out. 2. Bend your knees so they point up to the ceiling. Your feet should be flat on the floor. 3. Tighten your lower belly (abdomen) muscles to press your lower back against the floor. This will make your tailbone point up to the ceiling instead of pointing down to your feet or the floor. 4. Stay in this position for 5-10 seconds while you gently tighten your muscles and breathe evenly. Cat-cow Do these steps until your lower back bends more easily: 1. Get on your hands and knees on a firm surface. Keep your hands under your shoulders, and keep your knees under your hips. You may put padding under your knees. 2. Let your head hang down toward your chest. Tighten (contract) the muscles in your belly. Point your tailbone toward the floor so your lower back becomes rounded like the back of a cat. 3. Stay in this position for 5 seconds. 4. Slowly lift your head. Let the muscles of your belly relax.  Point your tailbone up toward the ceiling so your back forms a sagging arch like the back of a cow. 5. Stay in this position for 5 seconds.  Press-ups Do these steps 5-10 times in a row: 1. Lie on your belly (face-down) on the floor. 2. Place your hands near your head, about shoulder-width apart. 3. While you keep your back relaxed and keep your hips on the floor, slowly straighten your arms to raise the top half of your body and lift your shoulders. Do not use your back muscles. You may change where you place your hands in order to make yourself more comfortable. 4. Stay in this position for 5 seconds. 5. Slowly return to lying flat on the floor.  Bridges Do these steps 10 times in a row: 1. Shanda Howells  on your back on a firm surface. 2. Bend your knees so they point up to the ceiling. Your feet should be flat on the floor. Your arms should be flat at your sides, next to your body. 3. Tighten your butt muscles and lift your butt off the floor until your waist is almost as high as your knees. If you do not feel the muscles working in your butt and the back of your thighs, slide your feet 1-2 inches farther away from your butt. 4. Stay in this position for 3-5 seconds. 5. Slowly lower your butt to the floor, and let your butt muscles relax. If this exercise is too easy, try doing it with your arms crossed over your chest. Belly crunches Do these steps 5-10 times in a row: 1. Lie on your back on a firm bed or the floor with your legs stretched out. 2. Bend your knees so they point up to the ceiling. Your feet should be flat on the floor. 3. Cross your arms over your chest. 4. Tip your chin a little bit toward your chest but do not bend your neck. 5. Tighten your belly muscles and slowly raise your chest just enough to lift your shoulder blades a tiny bit off of the floor. Avoid raising your body higher than that, because it can put too much stress on your low back. 6. Slowly lower your chest and your  head to the floor. Back lifts Do these steps 5-10 times in a row: 1. Lie on your belly (face-down) with your arms at your sides, and rest your forehead on the floor. 2. Tighten the muscles in your legs and your butt. 3. Slowly lift your chest off of the floor while you keep your hips on the floor. Keep the back of your head in line with the curve in your back. Look at the floor while you do this. 4. Stay in this position for 3-5 seconds. 5. Slowly lower your chest and your face to the floor. Contact a doctor if:  Your back pain gets a lot worse when you do an exercise.  Your back pain does not get better 2 hours after you exercise. If you have any of these problems, stop doing the exercises. Do not do them again unless your doctor says it is okay. Get help right away if:  You have sudden, very bad back pain. If this happens, stop doing the exercises. Do not do them again unless your doctor says it is okay. This information is not intended to replace advice given to you by your health care provider. Make sure you discuss any questions you have with your health care provider. Document Revised: 01/19/2018 Document Reviewed: 01/19/2018 Elsevier Patient Education  2020 [employer].

## 2019-08-15 NOTE — Progress Notes (Signed)
Assessment & Plan:  1. Acute costochondritis - Patient was just treated with steroids for two weeks. Advised to try tizanidine and Voltaren gel. Education provided on costochondritis. Discussed case with supervising physician.  - tiZANidine (ZANAFLEX) 2 MG tablet; Take 1 tablet (2 mg total) by mouth 3 (three) times daily as needed for muscle spasms.  Dispense: 30 tablet; Refill: 0  2. Chest pain, unspecified type - EKG 12-Lead   Follow up plan: Return in about 2 weeks (around 08/29/2019) for f/u pain.  Hendricks Limes, MSN, APRN, FNP-C [facility]  Subjective:   Patient ID: Karen Potter, female    DOB: 01-22-1941, 79 y.o.   MRN: [id number]  HPI: Karen Potter is a 79 y.o. female presenting on 08/15/2019 for Chest Pain (x 4 days)  Patient reports she has been experiencing pain in the midsternal chest, right shoulder, and right side of her back x4 days. The pain is there if she is "sitting, breathing, or moving". It is there as soon as she wakes up in the morning. It does sporadically go away but not for long. Denies any known injury. Describes pain as throbbing and rates it between 5-7/10.    ROS: Negative unless specifically indicated above in HPI.   Relevant past medical history reviewed and updated as indicated.   Allergies and medications reviewed and updated.   Current Outpatient Medications:  .  aspirin 325 MG tablet, Take 325 mg by mouth every evening. , Disp: , Rfl:  .  Blood Glucose Monitoring Suppl (BLOOD GLUCOSE MONITOR SYSTEM) w/Device KIT, Use glucose meter to check blood sugar up to 3 times daily as instructed. One Touch Verio preferred by insurance. 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  carvedilol (COREG) 6.25 MG tablet, Take 1 tablet (6.25 mg total) by mouth 2 (two) times daily with a meal., Disp: 180 tablet, Rfl: 3 .  Cholecalciferol (VITAMIN D3) 2000 UNITS capsule, Take 2,000 Units by mouth 2 (two) times daily. , Disp: , Rfl:  .   clobetasol ointment (TEMOVATE) 0.05 %, APPLY TOPICALLY TWICE DAILY, Disp: 30 g, Rfl: 0 .  clotrimazole-betamethasone (LOTRISONE) cream, Apply topically 2 (two) times daily., Disp: 45 g, Rfl: 2 .  furosemide (LASIX) 40 MG tablet, Take 1 tablet (40 mg total) by mouth daily., Disp: 90 tablet, Rfl: 3 .  glucose blood test strip, One Touch Verio- Use with glucose meter to check blood sugar up to 3 times daily as instructed. Dx: E11.69, Disp: 100 each, Rfl: 12 .  Lancets Misc. KIT, Use patient & insurance preferred lancet device to test blood glucose up to 3 times daily for Dx: E11.69, Disp: 100 each, Rfl: 12 .  levothyroxine (SYNTHROID) 175 MCG tablet, TAKE ONE TABLET BY MOUTH ONCE DAILY SUNDAY-THURSDAY  (TAKE  150MCG  ON  FRIDAY), Disp: 90 tablet, Rfl: 3 .  losartan (COZAAR) 25 MG tablet, Take 1 tablet (25 mg total) by mouth daily., Disp: 90 tablet, Rfl: 3 .  Omega-3 Fatty Acids (FISH OIL TRIPLE STRENGTH) 1400 MG CAPS, Take 1,000 mg by mouth 4 (four) times daily. , Disp: , Rfl:  .  omeprazole (PRILOSEC) 20 MG capsule, Take 1 capsule (20 mg total) by mouth 2 (two) times daily before a meal., Disp: 180 capsule, Rfl: 3 .  tiZANidine (ZANAFLEX) 2 MG tablet, Take 1 tablet (2 mg total) by mouth 3 (three) times daily as needed for muscle spasms., Disp: 30 tablet, Rfl: 0  Allergies  Allergen Reactions  . Codeine  Itching  . Dilaudid [Hydromorphone] Other (See Comments)    Mouth blisters  . Other Itching    Most pain meds cause itching.  When has to take pain medication, she has been instructed to take Benadryl  . Doxycycline Rash    Objective:   BP 105/62   Pulse 79   Temp 98.6 F (37 C) (Temporal)   Ht '5\' 4"'  (1.626 m)   Wt 239 lb (108.4 kg)   SpO2 94%   BMI 41.02 kg/m    Physical Exam Vitals reviewed.  Constitutional:      General: She is not in acute distress.    Appearance: Normal appearance. She is morbidly obese. She is not ill-appearing, toxic-appearing or diaphoretic.  HENT:      Head: Normocephalic and atraumatic.  Eyes:     General: No scleral icterus.       Right eye: No discharge.        Left eye: No discharge.     Conjunctiva/sclera: Conjunctivae normal.  Cardiovascular:     Rate and Rhythm: Normal rate and regular rhythm.     Heart sounds: Normal heart sounds. No murmur. No friction rub. No gallop.   Pulmonary:     Effort: Pulmonary effort is normal. No respiratory distress.     Breath sounds: Normal breath sounds. No stridor. No wheezing, rhonchi or rales.  Musculoskeletal:        General: Normal range of motion.     Cervical back: Normal range of motion.     Comments: Able to reproduce pain with palpation in her back and sternal region.   Skin:    General: Skin is warm and dry.     Capillary Refill: Capillary refill takes less than 2 seconds.  Neurological:     General: No focal deficit present.     Mental Status: She is alert and oriented to person, place, and time. Mental status is at baseline.  Psychiatric:        Mood and Affect: Mood normal.        Behavior: Behavior normal.        Thought Content: Thought content normal.        Judgment: Judgment normal.    EKG: SR HR 78 with right bundle branch block

## 2019-08-16 ENCOUNTER — Emergency Department (HOSPITAL_COMMUNITY)
Admission: EM | Admit: 2019-08-16 | Discharge: 2019-08-17 | Disposition: A | Discharge status: Home / Self Care | Payer: Medicare Other | Attending: Emergency Medicine | Admitting: Emergency Medicine

## 2019-08-16 ENCOUNTER — Encounter (HOSPITAL_COMMUNITY): Payer: Self-pay

## 2019-08-16 ENCOUNTER — Other Ambulatory Visit: Payer: Self-pay

## 2019-08-16 DIAGNOSIS — I10 Essential (primary) hypertension: Secondary | ICD-10-CM | POA: Insufficient documentation

## 2019-08-16 DIAGNOSIS — Z79899 Other long term (current) drug therapy: Secondary | ICD-10-CM | POA: Diagnosis not present

## 2019-08-16 DIAGNOSIS — I700 Atherosclerosis of aorta: Secondary | ICD-10-CM | POA: Diagnosis not present

## 2019-08-16 DIAGNOSIS — R071 Chest pain on breathing: Secondary | ICD-10-CM | POA: Insufficient documentation

## 2019-08-16 DIAGNOSIS — Z96651 Presence of right artificial knee joint: Secondary | ICD-10-CM | POA: Diagnosis not present

## 2019-08-16 DIAGNOSIS — E039 Hypothyroidism, unspecified: Secondary | ICD-10-CM | POA: Insufficient documentation

## 2019-08-16 DIAGNOSIS — Z7982 Long term (current) use of aspirin: Secondary | ICD-10-CM | POA: Insufficient documentation

## 2019-08-16 DIAGNOSIS — R0789 Other chest pain: Secondary | ICD-10-CM | POA: Diagnosis not present

## 2019-08-16 DIAGNOSIS — R0602 Shortness of breath: Secondary | ICD-10-CM | POA: Diagnosis not present

## 2019-08-16 DIAGNOSIS — E119 Type 2 diabetes mellitus without complications: Secondary | ICD-10-CM | POA: Diagnosis not present

## 2019-08-16 DIAGNOSIS — K5730 Diverticulosis of large intestine without perforation or abscess without bleeding: Secondary | ICD-10-CM | POA: Diagnosis not present

## 2019-08-16 DIAGNOSIS — R079 Chest pain, unspecified: Secondary | ICD-10-CM | POA: Diagnosis present

## 2019-08-16 NOTE — ED Triage Notes (Signed)
Pt reports chest pain x 5 days, saw her pmd yesterday and diagnosed with costochondritis, given meds for same without relief.

## 2019-08-17 ENCOUNTER — Emergency Department (HOSPITAL_COMMUNITY): Payer: Medicare Other

## 2019-08-17 DIAGNOSIS — K5730 Diverticulosis of large intestine without perforation or abscess without bleeding: Secondary | ICD-10-CM | POA: Diagnosis not present

## 2019-08-17 DIAGNOSIS — R079 Chest pain, unspecified: Secondary | ICD-10-CM | POA: Diagnosis not present

## 2019-08-17 DIAGNOSIS — I700 Atherosclerosis of aorta: Secondary | ICD-10-CM | POA: Diagnosis not present

## 2019-08-17 LAB — BASIC METABOLIC PANEL
Anion gap: 8 (ref 5–15)
BUN: 13 mg/dL (ref 8–23)
CO2: 26 mmol/L (ref 22–32)
Calcium: 8.4 mg/dL — ABNORMAL LOW (ref 8.9–10.3)
Chloride: 106 mmol/L (ref 98–111)
Creatinine, Ser: 0.95 mg/dL (ref 0.44–1.00)
GFR calc Af Amer: >60 mL/min (ref >60)
GFR calc non Af Amer: 58 mL/min — ABNORMAL LOW (ref >60)
Glucose, Bld: 152 mg/dL — ABNORMAL HIGH (ref 70–99)
Potassium: 3.4 mmol/L — ABNORMAL LOW (ref 3.5–5.1)
Sodium: 140 mmol/L (ref 135–145)

## 2019-08-17 LAB — CBC WITH DIFFERENTIAL/PLATELET
Abs Immature Granulocytes: 0.02 10*3/uL (ref 0.00–0.07)
Basophils Absolute: 0.1 10*3/uL (ref 0.0–0.1)
Basophils Relative: 1 %
Eosinophils Absolute: 0.2 10*3/uL (ref 0.0–0.5)
Eosinophils Relative: 2 %
HCT: 37.1 % (ref 36.0–46.0)
Hemoglobin: 12.1 g/dL (ref 12.0–15.0)
Immature Granulocytes: 0 %
Lymphocytes Relative: 30 %
Lymphs Abs: 2.2 10*3/uL (ref 0.7–4.0)
MCH: 29.8 pg (ref 26.0–34.0)
MCHC: 32.6 g/dL (ref 30.0–36.0)
MCV: 91.4 fL (ref 80.0–100.0)
Monocytes Absolute: 0.8 10*3/uL (ref 0.1–1.0)
Monocytes Relative: 11 %
Neutro Abs: 4.2 10*3/uL (ref 1.7–7.7)
Neutrophils Relative %: 56 %
Platelets: 263 10*3/uL (ref 150–400)
RBC: 4.06 MIL/uL (ref 3.87–5.11)
RDW: 12.7 % (ref 11.5–15.5)
WBC: 7.3 10*3/uL (ref 4.0–10.5)
nRBC: 0 % (ref 0.0–0.2)

## 2019-08-17 LAB — D-DIMER, QUANTITATIVE: D-Dimer, Quant: 0.5 ug/mL-FEU (ref 0.00–0.50)

## 2019-08-17 LAB — TROPONIN I (HIGH SENSITIVITY)
Troponin I (High Sensitivity): 4 ng/L (ref <18)
Troponin I (High Sensitivity): 4 ng/L (ref <18)

## 2019-08-17 MED ORDER — IOHEXOL 350 MG/ML SOLN
100.0000 mL | Freq: Once | INTRAVENOUS | Status: AC | PRN
  Administered 2019-08-17: 02:00:00 100 mL via INTRAVENOUS

## 2019-08-17 MED ORDER — KETOROLAC TROMETHAMINE 30 MG/ML IJ SOLN
15.0000 mg | Freq: Once | INTRAMUSCULAR | Status: AC
  Administered 2019-08-17: 15 mg via INTRAVENOUS
  Filled 2019-08-17: qty 1

## 2019-08-17 NOTE — ED Notes (Signed)
Pt. Ambulated well. O2 saturation maintained at 98%. Pt. Pulse was 88. Pt. Stated her chest hurt and appeared to be breathing more heavily.

## 2019-08-17 NOTE — ED Notes (Signed)
Left BP- 111/86 (95)  Right BP- 99/62 (75)

## 2019-08-17 NOTE — Discharge Instructions (Signed)
There is no evidence of heart attack or blood clot in the lung.  Your testing is reassuring but you should still follow-up with your doctor for a stress test.  Return to the ED for chest pain becomes exertional, associated with shortness of breath, nausea, vomiting, sweating or other concerns.

## 2019-08-17 NOTE — ED Provider Notes (Signed)
[facility] EMERGENCY DEPARTMENT Provider Note   CSN: [id number] Arrival date & time: 08/16/19  2322     History Chief Complaint  Patient presents with  . Chest Pain    Karen Potter is a 79 y.o. female.  Patient with a history of diabetes, hypertension, hypothyroidism presenting with chest pain for the past 5 days.  She reports pain between her breasts that radiates to her mid back between her shoulder blades.  The pain is fairly constant and last for several hours at a time.  It does go away completely but always seems to come right back.  She saw her PCP for the same pain yesterday and was treated for costochondritis with Voltaren gel and tizanidine which helps somewhat.  She comes in tonight at the request of her family.  She is not having any pain currently.  She reports the pain was there throughout the day radiating to her back and right side.  There is some pain with breathing but she does not feel short of breath.  No cough or fever.  No abdominal pain, nausea or vomiting.  No black or bloody stools.  She has had an ulcer in the past.  Also has a history of hypertension, hypothyroidism and diabetes.  No rash was visualized.  She is not certain if she had a stress test believes was many years ago.  Chest pain is worse with palpation worse with moving her right arm.  She denies feeling short of breath but does have pain with breathing.  There is been no nausea or vomiting.  No diaphoresis.  She has not noticed that it is exertional.   The history is provided by the patient and a relative.  Chest Pain Associated symptoms: shortness of breath   Associated symptoms: no abdominal pain, no back pain, no dizziness, no fever, no headache, no nausea, no vomiting and no weakness        Past Medical History:  Diagnosis Date  . Duodenal ulcer without hemorrhage or perforation    08-29-2015 per EGD reort  . Fatty liver   . Gastric ulcer without hemorrhage or perforation    08-29-2015 per  EGD report  . Gastritis    per EGD 08-29-2015  . GERD (gastroesophageal reflux disease)   . Hearing loss    no hearing aids  . 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   age 36--  pt states was quarantined   no treatment ;  per pt no symptoms or issues since  . History of kidney stones   . History of TIA (transient ischemic attack)    03-23-2014  w/ episode temporay amnesia  . Hyperlipidemia   . Hypertension   . Hypothyroidism   . Iron deficiency anemia   . LAFB (left anterior fascicular block)   . OA (osteoarthritis)   . OSA on CPAP    severe per study 2003  . 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 RBBB (right bundle branch block)   . Seasonal allergies   . Type 2 diabetes mellitus ([place])   . Vitamin D deficiency     Patient Active Problem List   Diagnosis Date Noted  . History of TIAs 04/26/2018  . 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  Abnormal EKG 04/26/2018  . Mixed conductive and sensorineural hearing loss of both ears 01/27/2018  . 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 Obesity, Class III, BMI 40-49.9 (morbid obesity) ([place]) 10/28/2016  . Primary hyperparathyroidism ([place]) 05/06/2015  . Hyperparathyroidism, primary ([place]) 05/05/2015  . Type 2 diabetes mellitus with other specified complication ([place])   . GERD (gastroesophageal reflux disease) 11/05/2013  . Fatty liver 06/01/2013  . Hypercalcemia 05/14/2013  . Abnormal transaminases 05/14/2013  . Hot flashes 05/14/2013  . Hypertension associated with diabetes ([place])   . Hypothyroid   . Migraine   . Polio   . Amnesia   . Seasonal allergies   . Vitamin D deficiency   . Aphasia 04/11/2012  . OBSTRUCTIVE SLEEP APNEA 05/31/2008  . Hyperlipidemia associated with type 2 diabetes mellitus ([place]) 05/31/2008  . OVARIAN CYST 05/31/2008     Past Surgical History:  Procedure Laterality Date  . CARPAL TUNNEL RELEASE Right 05-10-2007  . CHOLECYSTECTOMY OPEN  1985   and Appendectomy  . CYSTO/  RIGHT URETEROSCOPIC STONE EXTRACTON/  STENT PLACEMENT  06/ 2016   in [place]  . CYSTOSCOPY/RETROGRADE/URETEROSCOPY/STONE EXTRACTION WITH BASKET Right 11/17/2015   Procedure: CYSTOSCOPY/RETROGRADE PYELOGRAM RIGHT/DIAGNOSTIC RIGHT URETEROSCOPY/LASER RENAL CALCIFICATION/RIGHT STENT PLACEMENT;  Surgeon: Cleon Gustin, MD;  Location: [facility];  Service: Urology;  Laterality: Right;  . ESOPHAGOGASTRODUODENOSCOPY N/A 08/29/2015   Procedure: ESOPHAGOGASTRODUODENOSCOPY (EGD);  Surgeon: Danie Binder, MD;  Location: [facility];  Service: Endoscopy;  Laterality: N/A;  . HOLMIUM LASER APPLICATION Right 2/75/1700   Procedure: HOLMIUM LASER APPLICATION;  Surgeon: Cleon Gustin, MD;  Location: [facility];  Service: Urology;  Laterality: Right;  . KNEE ARTHROSCOPY Right 1999  . LEFT HEEL REPAIR SURGERY  1954;  1985;  1995   polio  . LUMBAR SPINE SURGERY  09-01-2010   L4 -L5 fusion,  L3 laminectomy,  L5 - S1 foraminotomy  . OVARIAN CYST SURGERY Right 1966  . PARATHYROIDECTOMY N/A 05/06/2015   Procedure: PARATHYROIDECTOMY;  Surgeon: Armandina Gemma, MD;  Location: [facility];  Service: General;  Laterality: N/A;   left superior  . TONSILLECTOMY  as child  . TOTAL ABDOMINAL HYSTERECTOMY W/ BILATERAL SALPINGOOPHORECTOMY  1984  . TOTAL KNEE ARTHROPLASTY Right 04-08-2008  . TRANSTHORACIC ECHOCARDIOGRAM  04-07-2012   grade 1 diastolic function,  ef 17-49%/  mild AV calcification without stenosis/  trivial MR     OB History   No obstetric history on file.     Family History  Problem Relation Age of Onset  . Heart failure Mother        No details.  No MI  . Cancer Father        Lung  . Cancer Brother        Lung  . Stroke Maternal Grandfather   . Cancer Paternal Grandmother        rectal  . Cancer Paternal  Grandfather        gastric    Social History   Tobacco Use  . Smoking status: Never Smoker  . Smokeless tobacco: Never Used  Substance Use Topics  . Alcohol use: No  . Drug use: No    Home Medications Prior to Admission medications   Medication Sig Start Date End Date Taking? Authorizing Provider  aspirin 325 MG tablet Take 325 mg by mouth every evening.     [provider]  Blood Glucose Monitoring Suppl (BLOOD GLUCOSE MONITOR SYSTEM) w/Device KIT Use glucose meter to check blood sugar up to  3 times daily as instructed. One Touch Verio preferred by insurance. Dx: EE11.69 05/12/18   Dettinger, Fransisca Kaufmann, MD  carvedilol (COREG) 6.25 MG tablet Take 1 tablet (6.25 mg total) by mouth 2 (two) times daily with a meal. 01/05/19   Dettinger, Fransisca Kaufmann, MD  Cholecalciferol (VITAMIN D3) 2000 UNITS capsule Take 2,000 Units by mouth 2 (two) times daily.     [provider]  clobetasol ointment (TEMOVATE) 0.05 % APPLY TOPICALLY TWICE DAILY 03/30/19   Dettinger, Fransisca Kaufmann, MD  clotrimazole-betamethasone (LOTRISONE) cream Apply topically 2 (two) times daily. 08/07/18   Dettinger, Fransisca Kaufmann, MD  furosemide (LASIX) 40 MG tablet Take 1 tablet (40 mg total) by mouth daily. 01/05/19   Dettinger, Fransisca Kaufmann, MD  glucose blood test strip One Touch Verio- Use with glucose meter to check blood sugar up to 3 times daily as instructed. Dx: E11.69 05/12/18   Dettinger, Fransisca Kaufmann, MD  Lancets Misc. KIT Use patient & insurance preferred lancet device to test blood glucose up to 3 times daily for Dx: E11.69 05/12/18   Dettinger, Fransisca Kaufmann, MD  levothyroxine (SYNTHROID) 175 MCG tablet TAKE ONE TABLET BY MOUTH ONCE DAILY SUNDAY-THURSDAY  (TAKE  150MCG  ON  FRIDAY) 01/05/19   Dettinger, Fransisca Kaufmann, MD  losartan (COZAAR) 25 MG tablet Take 1 tablet (25 mg total) by mouth daily. 01/05/19 01/05/20  Dettinger, Fransisca Kaufmann, MD  Omega-3 Fatty Acids (FISH OIL TRIPLE STRENGTH) 1400 MG CAPS Take 1,000 mg by mouth 4 (four) times daily.      [provider]  omeprazole (PRILOSEC) 20 MG capsule Take 1 capsule (20 mg total) by mouth 2 (two) times daily before a meal. 08/29/15   Fields, Marga Melnick, MD  tiZANidine (ZANAFLEX) 2 MG tablet Take 1 tablet (2 mg total) by mouth 3 (three) times daily as needed for muscle spasms. 08/15/19   Loman Brooklyn, FNP    Allergies    Codeine, Dilaudid [hydromorphone], Other, and Doxycycline  Review of Systems   Review of Systems  Constitutional: Negative for activity change, appetite change and fever.  HENT: Negative for congestion and rhinorrhea.   Eyes: Negative for visual disturbance.  Respiratory: Positive for chest tightness and shortness of breath.   Cardiovascular: Positive for chest pain.  Gastrointestinal: Negative for abdominal pain, nausea and vomiting.  Genitourinary: Negative for dysuria and hematuria.  Musculoskeletal: Negative for back pain.  Skin: Negative for rash.  Neurological: Negative for dizziness, weakness and headaches.   all other systems are negative except as noted in the HPI and PMH.    Physical Exam Updated Vital Signs BP (!) 105/45 (BP Location: Left Arm)   Pulse 70   Temp 98.1 F (36.7 C) (Oral)   Resp 15   Ht _0  (1.626 m)   Wt 108.9 kg   SpO2 97%   BMI 41.20 kg/m   Physical Exam Vitals and nursing note reviewed.  Constitutional:      General: She is not in acute distress.    Appearance: She is well-developed.  HENT:     Head: Normocephalic and atraumatic.     Mouth/Throat:     Pharynx: No oropharyngeal exudate.  Eyes:     Conjunctiva/sclera: Conjunctivae normal.     Pupils: Pupils are equal, round, and reactive to light.  Neck:     Comments: No meningismus. Cardiovascular:     Rate and Rhythm: Normal rate and regular rhythm.     Heart sounds: Normal heart sounds. No murmur.  Comments: Equal upper extremity grip strength and radial pulse Pulmonary:     Effort: Pulmonary effort is normal. No respiratory distress.     Breath  sounds: Normal breath sounds.     Comments: Central chest tenderness, no pain to palpation underneath right breast.  There is no rash. Chest:     Chest wall: Tenderness present.  Abdominal:     Palpations: Abdomen is soft.     Tenderness: There is no abdominal tenderness. There is no guarding or rebound.  Musculoskeletal:        General: No tenderness. Normal range of motion.     Cervical back: Normal range of motion and neck supple.  Skin:    General: Skin is warm.     Capillary Refill: Capillary refill takes less than 2 seconds.  Neurological:     General: No focal deficit present.     Mental Status: She is alert and oriented to person, place, and time. Mental status is at baseline.     Cranial Nerves: No cranial nerve deficit.     Motor: No abnormal muscle tone.     Coordination: Coordination normal.     Comments:  5/5 strength throughout. CN 2-12 intact.Equal grip strength.   Psychiatric:        Behavior: Behavior normal.     ED Results / Procedures / Treatments   Labs (all labs ordered are listed, but only abnormal results are displayed) Labs Reviewed  BASIC METABOLIC PANEL - Abnormal; Notable for the following components:      Result Value   Potassium 3.4 (*)    Glucose, Bld 152 (*)    Calcium 8.4 (*)    GFR calc non Af Amer 58 (*)    All other components within normal limits  CBC WITH DIFFERENTIAL/PLATELET  D-DIMER, QUANTITATIVE (NOT AT [facility])  TROPONIN I (HIGH SENSITIVITY)  TROPONIN I (HIGH SENSITIVITY)    EKG EKG Interpretation  Date/Time:  Thursday August 16 2019 23:44:10 EDT Ventricular Rate:  73 PR Interval:    QRS Duration: 151 QT Interval:  442 QTC Calculation: 488 R Axis:   -62 Text Interpretation: Sinus rhythm RBBB and LAFB Probable left ventricular hypertrophy Baseline wander in lead(s) II aVR aVF No significant change was found Confirmed by Ezequiel Essex [phone]) on 08/16/2019 11:53:00 PM   Radiology DG Chest Portable 1 View  Result Date:  08/17/2019 CLINICAL DATA:  Chest pain x5 days. EXAM: PORTABLE CHEST 1 VIEW COMPARISON:  February 22, 2018 FINDINGS: There is no evidence of acute infiltrate, pleural effusion or pneumothorax. The heart size and mediastinal contours are within normal limits. Multilevel degenerative changes seen throughout the thoracic spine. IMPRESSION: No active disease. Electronically Signed   By: Virgina Norfolk M.D.   On: 08/17/2019 00:19   CT Angio Chest/Abd/Pel for Dissection W and/or Wo Contrast  Result Date: 08/17/2019 CLINICAL DATA:  Chest pain x5 days EXAM: CT ANGIOGRAPHY CHEST, ABDOMEN AND PELVIS TECHNIQUE: Non-contrast CT of the chest was initially obtained. Multidetector CT imaging through the chest, abdomen and pelvis was performed using the standard protocol during bolus administration of intravenous contrast. Multiplanar reconstructed images and MIPs were obtained and reviewed to evaluate the vascular anatomy. CONTRAST:  136m OMNIPAQUE IOHEXOL 350 MG/ML SOLN COMPARISON:  02/23/2018 FINDINGS: CTA CHEST FINDINGS Cardiovascular: There is no evidence for thoracic aortic dissection. There is no evidence for a thoracic aortic aneurysm. Mild atherosclerotic changes are noted of the thoracic aorta. The arch vessels are grossly patent where visualized. There is no evidence  for large centrally located pulmonary embolism. The heart size is essentially normal. There is no significant pericardial effusion. Mediastinum/Nodes: --No mediastinal or hilar lymphadenopathy. --No axillary lymphadenopathy. --No supraclavicular lymphadenopathy. --there is an apparent small 0.8 cm thyroid nodule (axial series 5, image 13). No followup recommended (ref: J Am Coll Radiol. 2015 Feb;12(2): 143-50). --The esophagus is unremarkable Lungs/Pleura: No pulmonary nodules or masses. No pleural effusion or pneumothorax. No focal airspace consolidation. No focal pleural abnormality. Musculoskeletal: No chest wall abnormality. No acute or significant  osseous findings. Review of the MIP images confirms the above findings. CTA ABDOMEN AND PELVIS FINDINGS VASCULAR Aorta: There are atherosclerotic changes throughout the abdominal aorta without evidence for an abdominal aortic aneurysm or dissection. Celiac: Patent without evidence of aneurysm, dissection, vasculitis or significant stenosis. SMA: Patent without evidence of aneurysm, dissection, vasculitis or significant stenosis. Renals: Both renal arteries are patent without evidence of aneurysm, dissection, vasculitis, fibromuscular dysplasia or significant stenosis. IMA: Patent without evidence of aneurysm, dissection, vasculitis or significant stenosis. Inflow: Patent without evidence of aneurysm, dissection, vasculitis or significant stenosis. Veins: No obvious venous abnormality within the limitations of this arterial phase study. Review of the MIP images confirms the above findings. NON-VASCULAR Hepatobiliary: The liver is normal. Status post cholecystectomy.There is extrahepatic biliary ductal dilatation that is felt to be secondary to a reservoir effect status post cholecystectomy. Pancreas: Normal contours without ductal dilatation. No peripancreatic fluid collection. Spleen: Unremarkable. Adrenals/Urinary Tract: --Adrenal glands: Unremarkable. --Right kidney/ureter: No hydronephrosis or radiopaque kidney stones. --Left kidney/ureter: There is a relatively stable exophytic cyst arising from the lower pole the left kidney. --Urinary bladder: The urinary bladder is decompressed and therefore is poorly evaluated. Stomach/Bowel: --Stomach/Duodenum: No hiatal hernia or other gastric abnormality. Normal duodenal course and caliber. --Small bowel: Unremarkable. --Colon: Rectosigmoid diverticulosis without acute inflammation. --Appendix: Not visualized. No right lower quadrant inflammation or free fluid. Lymphatic: --No retroperitoneal lymphadenopathy. --No mesenteric lymphadenopathy. --No pelvic or inguinal  lymphadenopathy. Reproductive: Status post hysterectomy. No adnexal mass. Other: No ascites or free air. The abdominal wall is normal. Musculoskeletal. The patient is status post prior L4-L5 posterior fusion. There are degenerative changes throughout the visualized thoracolumbar spine. There is no acute displaced fracture. Review of the MIP images confirms the above findings. IMPRESSION: 1. No evidence for aortic dissection. 2. No acute findings in the chest, abdomen or pelvis. 3. Rectosigmoid diverticulosis without acute inflammation. Aortic Atherosclerosis (ICD10-I70.0). Electronically Signed   By: Constance Holster M.D.   On: 08/17/2019 02:39    Procedures Procedures (including critical care time)  Medications Ordered in ED Medications - No data to display  ED Course  I have reviewed the triage vital signs and the nursing notes.  Pertinent labs & imaging results that were available during my care of the patient were reviewed by me and considered in my medical decision making (see chart for details).    MDM Rules/Calculators/A&P                       5 days of fairly constant chest pain that radiates to her back worse with breathing and movement of her right arm.  EKG shows a right bundle branch block which is unchanged.  Pain seems atypical for ACS.  Family is concerned about pulmonary embolism.  Troponin negative, D-dimer negative.  EKG is unchanged from previous.  Chest x-ray shows no pneumothorax or pneumonia.  No rib fracture  Low suspicion for ACS or PE.  Family is concerned and is requesting  CT scan anyway to further evaluate etiology of her chest pain.  Troponin negative x2.  CT scan is negative for aortic dissection or pulmonary embolism or other acute pathology.  Patient repeatedly declines pain medication in the ED. She is ambulatory without difficulty. She did agree to toradol which helped her pain.   Results d/w patient and family. ACS seems unlikely at this point with  unchanged EKG and negative troponins in setting of several days of constant pain. No PE or aortic dissection.   Continue treatment for suspected musculoskeletal pain with antiinflammatories and muscle relaxers. Watch for development of zoster rash. Followup with cardiologist Dr. Percival Spanish for possible stress test. No recent ischemic testing available. Did have reassuring echo in 2019. Return to the ED If chest pain becomes exertional, associated with SOB< nausea, vomiting, diaphoresis or any other concerns.  Final Clinical Impression(s) / ED Diagnoses Final diagnoses:  Atypical chest pain    Rx / DC Orders ED Discharge Orders    None       Thalia Turkington, Annie Main, MD 08/17/19 [phone]

## 2019-08-20 ENCOUNTER — Encounter: Payer: Self-pay | Admitting: Family Medicine

## 2019-08-24 ENCOUNTER — Telehealth: Payer: Self-pay

## 2019-08-24 MED ORDER — LEVOTHYROXINE SODIUM 150 MCG PO TABS: 150.0000 ug | ORAL_TABLET | Freq: Every day | ORAL | 1 refills | Status: DC

## 2019-08-24 NOTE — Telephone Encounter (Signed)
Refer to lab results from 08/03/19- medication was never sent in.  Sent in Levo 150 per lab result. Patient aware

## 2019-08-31 ENCOUNTER — Telehealth: Payer: Self-pay | Admitting: Family Medicine

## 2019-08-31 DIAGNOSIS — M25562 Pain in left knee: Secondary | ICD-10-CM | POA: Diagnosis not present

## 2019-08-31 DIAGNOSIS — I10 Essential (primary) hypertension: Secondary | ICD-10-CM | POA: Diagnosis not present

## 2019-08-31 NOTE — Telephone Encounter (Signed)
Advised patient that we have no appointments available and recommended she go to an urgent care to be evaluated.

## 2019-09-02 NOTE — Progress Notes (Signed)
Cardiology Clinic Note   Patient Name: Karen Potter Date of Encounter: 09/03/2019  Primary Care Provider:  Dettinger, Fransisca Kaufmann, MD Primary Cardiologist:  Minus Breeding, MD  Patient Profile    Karen Potter 79 year old female presents today for evaluation of her chest discomfort.  Past Medical History    Past Medical History:  Diagnosis Date  . Duodenal ulcer without hemorrhage or perforation    08-29-2015 per EGD reort  . Fatty liver   . Gastric ulcer without hemorrhage or perforation    08-29-2015 per EGD report  . Gastritis    per EGD 08-29-2015  . GERD (gastroesophageal reflux disease)   . Hearing loss    no hearing aids  . 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 History of kidney stones   . History of TIA (transient ischemic attack)    03-23-2014  w/ episode temporay amnesia  . Hyperlipidemia   . Hypertension   . Hypothyroidism   . Iron deficiency anemia   . LAFB (left anterior fascicular block)   . OA (osteoarthritis)   . OSA on CPAP    severe per study 2003  . 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 RBBB (right bundle branch block)   . Seasonal allergies   . Type 2 diabetes mellitus ([place])   . Vitamin D deficiency    Past Surgical History:  Procedure Laterality Date  . CARPAL TUNNEL RELEASE Right 05-10-2007  . CHOLECYSTECTOMY OPEN  1985   and Appendectomy  . CYSTO/  RIGHT URETEROSCOPIC STONE EXTRACTON/  STENT PLACEMENT  06/ 2016   in [place]  . CYSTOSCOPY/RETROGRADE/URETEROSCOPY/STONE EXTRACTION WITH BASKET Right 11/17/2015   Procedure: CYSTOSCOPY/RETROGRADE PYELOGRAM RIGHT/DIAGNOSTIC RIGHT URETEROSCOPY/LASER RENAL CALCIFICATION/RIGHT STENT PLACEMENT;  Surgeon: Cleon Gustin, MD;   Location: [facility];  Service: Urology;  Laterality: Right;  . ESOPHAGOGASTRODUODENOSCOPY N/A 08/29/2015   Procedure: ESOPHAGOGASTRODUODENOSCOPY (EGD);  Surgeon: Danie Binder, MD;  Location[facility];  Service: Endoscopy;  Laterality: N/A;  . HOLMIUM LASER APPLICATION Right 06/23/863   Procedure: HOLMIUM LASER APPLICATION;  Surgeon: Cleon Gustin, MD;  Location: [facility];  Service: Urology;  Laterality: Right;  . KNEE ARTHROSCOPY Right 1999  . LEFT HEEL REPAIR SURGERY  1954;  1985;  1995   polio  . LUMBAR SPINE SURGERY  09-01-2010   L4 -L5 fusion,  L3 laminectomy,  L5 - S1 foraminotomy  . OVARIAN CYST SURGERY Right 1966  . PARATHYROIDECTOMY N/A 05/06/2015   Procedure: PARATHYROIDECTOMY;  Surgeon: Armandina Gemma, MD;  Location: [facility];  Service: General;  Laterality: N/A;   left superior  . TONSILLECTOMY  as child  . TOTAL ABDOMINAL HYSTERECTOMY W/ BILATERAL SALPINGOOPHORECTOMY  1984  . TOTAL KNEE ARTHROPLASTY Right 04-08-2008  . TRANSTHORACIC ECHOCARDIOGRAM  04-07-2012   grade 1 diastolic function,  ef 78-46%/  mild AV calcification without stenosis/  trivial MR    Allergies  Allergies  Allergen Reactions  . Codeine Itching  . Dilaudid [Hydromorphone] Other (See Comments)    Mouth blisters  . Other Itching    Most pain meds cause itching.  When has  to take pain medication, she has been instructed to take Benadryl  . Doxycycline Rash    History of Present Illness    Ms. Diguglielmo has a past medical history of HTN, migraine, OSA, GERD, HLD, hypothyroid, type 2 diabetes, polio, abnormal EKG, TIA, and obesity.  She was last seen by Dr. Percival Spanish on 04/26/2018.  During that time she was being evaluated for increased shortness of breath.  Her echocardiogram from May 2019 showed a small  pericardial effusion with normal EF.  She reported a cough that have been treated with antibiotics and steroids which improved.  In May she had increased shortness  of breath.  A follow-up CXR showed increased cardiac silhouette with no vascular congestion.  An echocardiogram in September demonstrated no pericardial effusion.  She was hospitalized in October with sepsis related to Salmonella.  She has been started on a higher dose of diuretic which has improved her breathing.  She denied increased work of breathing, PND, and orthopnea.  She did she had some chest pain while on vacation [facility].  Her chest discomfort persisted for 5 minutes and then dissipated.  She did not have any repeat chest discomfort.  She was told to follow-up as needed.  She presented to the emergency department on 08/16/2019 with atypical chest pain that has been present for 5 days.  She stated that it was located between her breast and radiated to her back between her shoulder blades.  The pain has been consistent for several hours.  She stated that the pain would go away but then would come back.  She stated she had previously seen her PCP and was treated for costochondritis with Voltaren gel and tizanidine.  This treatment helped.  She reported some pain with breathing.  Her EKG showed sinus rhythm with RBBB and LAFB with LVH.  Troponins were negative, D-dimer negative, CXR negative.  Low suspicion for ACS.  Suspected musculoskeletal pain.  She presents the clinic today for further evaluation and states she presented to [facility] emergency department on 08/16/2019 at the request of her daughter who works at [employer].  She was concerned about "blood clots".  The results of the test during her emergency department visit were reviewed.  She states that her chest discomfort has resolved.  She states she is unable to increase her physical activity at this time due to left knee pain.  She has been instructed to wait for another week before presenting to orthopedics for further evaluation.  She has had a right knee replacement.  She also indicated that over the week when she presented to the  emergency department she was under a increased amount of stress related to her son's MRSA infection and her daughter's fianc passed away.  At this time she is stable from a cardiac standpoint and has no further cardiac complaints.  I will have her follow-up as needed  Today she denies chest pain, shortness of breath, lower extremity edema, fatigue, palpitations, melena, hematuria, hemoptysis, diaphoresis, weakness, presyncope, syncope, orthopnea, and PND.   Home Medications    Prior to Admission medications   Medication Sig Start Date End Date Taking? Authorizing Provider  aspirin 325 MG tablet Take 325 mg by mouth every evening.     [provider]  Blood Glucose Monitoring Suppl (BLOOD GLUCOSE MONITOR SYSTEM) w/Device KIT Use glucose meter to check blood sugar up to 3 times daily as instructed. One Touch Verio preferred by insurance. Dx: EE11.69 05/12/18   Dettinger, Fransisca Kaufmann,  MD  carvedilol (COREG) 6.25 MG tablet Take 1 tablet (6.25 mg total) by mouth 2 (two) times daily with a meal. 01/05/19   Dettinger, Fransisca Kaufmann, MD  Cholecalciferol (VITAMIN D3) 2000 UNITS capsule Take 2,000 Units by mouth 2 (two) times daily.     [provider]  clobetasol ointment (TEMOVATE) 0.05 % APPLY TOPICALLY TWICE DAILY 03/30/19   Dettinger, Fransisca Kaufmann, MD  clotrimazole-betamethasone (LOTRISONE) cream Apply topically 2 (two) times daily. 08/07/18   Dettinger, Fransisca Kaufmann, MD  furosemide (LASIX) 40 MG tablet Take 1 tablet (40 mg total) by mouth daily. 01/05/19   Dettinger, Fransisca Kaufmann, MD  glucose blood test strip One Touch Verio- Use with glucose meter to check blood sugar up to 3 times daily as instructed. Dx: E11.69 05/12/18   Dettinger, Fransisca Kaufmann, MD  Lancets Misc. KIT Use patient & insurance preferred lancet device to test blood glucose up to 3 times daily for Dx: E11.69 05/12/18   Dettinger, Fransisca Kaufmann, MD  levothyroxine (SYNTHROID) 150 MCG tablet Take 1 tablet (150 mcg total) by mouth daily. 08/24/19   Dettinger,  Fransisca Kaufmann, MD  losartan (COZAAR) 25 MG tablet Take 1 tablet (25 mg total) by mouth daily. 01/05/19 01/05/20  Dettinger, Fransisca Kaufmann, MD  Omega-3 Fatty Acids (FISH OIL TRIPLE STRENGTH) 1400 MG CAPS Take 1,000 mg by mouth 4 (four) times daily.     [provider]  omeprazole (PRILOSEC) 20 MG capsule Take 1 capsule (20 mg total) by mouth 2 (two) times daily before a meal. 08/29/15   Fields, Marga Melnick, MD  tiZANidine (ZANAFLEX) 2 MG tablet Take 1 tablet (2 mg total) by mouth 3 (three) times daily as needed for muscle spasms. 08/15/19   Loman Brooklyn, FNP    Family History    Family History  Problem Relation Age of Onset  . Heart failure Mother        No details.  No MI  . Cancer Father        Lung  . Cancer Brother        Lung  . Stroke Maternal Grandfather   . Cancer Paternal Grandmother        rectal  . Cancer Paternal Grandfather        gastric   She indicated that her mother is deceased. She indicated that her father is deceased. She indicated that her brother is deceased. She indicated that the status of her maternal grandfather is unknown. She indicated that the status of her paternal grandmother is unknown. She indicated that the status of her paternal grandfather is unknown.  Social History    Social History   Socioeconomic History  . Marital status: Married    Spouse name: Lavell Luster  . Number of children: 4  . Years of education: 13-some college  . Highest education level: High school graduate  Occupational History  . Occupation: Retired  Tobacco Use  . Smoking status: Never Smoker  . Smokeless tobacco: Never Used  Substance and Sexual Activity  . Alcohol use: No  . Drug use: No  . Sexual activity: Not Currently    Birth control/protection: Surgical  Other Topics Concern  . Not on file  Social History Narrative   Lives with husband.  Four children.     Social Determinants of Health   Financial Resource Strain: Low Risk   . Difficulty of Paying Living Expenses:  Not hard at all  Food Insecurity: No Food Insecurity  . Worried About [employer] of  Food in the Last Year: Never true  . Ran Out of Food in the Last Year: Never true  Transportation Needs: No Transportation Needs  . Lack of Transportation (Medical): No  . Lack of Transportation (Non-Medical): No  Physical Activity: Inactive  . Days of Exercise per Week: 0 days  . Minutes of Exercise per Session: 0 min  Stress: No Stress Concern Present  . Feeling of Stress : Not at all  Social Connections: Not Isolated  . Frequency of Communication with Friends and Family: More than three times a week  . Frequency of Social Gatherings with Friends and Family: More than three times a week  . Attends Religious Services: More than 4 times per year  . Active Member of Clubs or Organizations: Yes  . Attends Archivist Meetings: More than 4 times per year  . Marital Status: Married  Human resources officer Violence: Not At Risk  . Fear of Current or Ex-Partner: No  . Emotionally Abused: No  . Physically Abused: No  . Sexually Abused: No     Review of Systems    General:  No chills, fever, night sweats or weight changes.  Cardiovascular:  No chest pain, dyspnea on exertion, edema, orthopnea, palpitations, paroxysmal nocturnal dyspnea. Dermatological: No rash, lesions/masses Respiratory: No cough, dyspnea Urologic: No hematuria, dysuria Abdominal:   No nausea, vomiting, diarrhea, bright red blood per rectum, melena, or hematemesis Neurologic:  No visual changes, wkns, changes in mental status. All other systems reviewed and are otherwise negative except as noted above.  Physical Exam    VS:  BP 128/64   Pulse 81   Temp (!) 97.3 F (36.3 C)   Ht '5\' 4"'  (1.626 m)   Wt 246 lb 3.2 oz (111.7 kg)   SpO2 97%   BMI 42.26 kg/m  , BMI Body mass index is 42.26 kg/m. GEN: Well nourished, well developed, in no acute distress. HEENT: normal. Neck: Supple, no JVD, carotid bruits, or masses. Cardiac:  RRR, no murmurs, rubs, or gallops. No clubbing, cyanosis, edema.  Radials/DP/PT 2+ and equal bilaterally.  Respiratory:  Respirations regular and unlabored, clear to auscultation bilaterally. GI: Soft, nontender, nondistended, BS + x 4. MS: no deformity or atrophy. Skin: warm and dry, no rash. Neuro:  Strength and sensation are intact. Psych: Normal affect.  Accessory Clinical Findings    ECG personally reviewed by me today-none today.  EKG 08/17/2019 sinus rhythm with RBBB and LAFB with LVH 73 bpm  Echocardiogram 01/24/2018 Study Conclusions   - Left ventricle: The cavity size was normal. Wall thickness was  increased in a pattern of mild LVH. Systolic function was  vigorous. The estimated ejection fraction was in the range of 65%  to 70%. Doppler parameters are consistent with abnormal left  ventricular relaxation (grade 1 diastolic dysfunction).   Assessment & Plan   1.  Atypical chest pain-no chest discomfort today.  Heart rate today 72.  Evaluated in the emergency department on 08/17/2019.  Felt to be musculoskeletal in nature. Heart healthy low-sodium diet-salty 6 sheet given Increase physical activity as tolerated Mindfulness stress reduction sheet given Continue to monitor  Dyspnea on exertion-no increased work of breathing today.  BMP from 08/16/2019 personally reviewed. Continue current medical therapy Increase physical activity as tolerated Heart healthy low-sodium diet  OSA-compliant with CPAP. Continue use  Obesity-weight today 246 pounds. Heart healthy low-sodium diet Increase physical activity as tolerated Continue weight loss  Disposition: Follow-up with Dr. Percival Spanish as needed   Jossie Ng.  Terrelle Ruffolo NP-C    09/03/2019, 2:14 PM [address] Office [phone] Fax [phone]

## 2019-09-03 ENCOUNTER — Other Ambulatory Visit: Payer: Self-pay

## 2019-09-03 ENCOUNTER — Ambulatory Visit: Payer: Medicare Other | Admitting: General Practice

## 2019-09-03 ENCOUNTER — Encounter: Payer: Self-pay | Admitting: General Practice

## 2019-09-03 VITALS — BP 128/64 | HR 81 | Temp 97.3°F | Ht 64.0 in | Wt 246.2 lb

## 2019-09-03 DIAGNOSIS — G4733 Obstructive sleep apnea (adult) (pediatric): Secondary | ICD-10-CM

## 2019-09-03 DIAGNOSIS — R0600 Dyspnea, unspecified: Secondary | ICD-10-CM | POA: Diagnosis not present

## 2019-09-03 DIAGNOSIS — R0609 Other forms of dyspnea: Secondary | ICD-10-CM

## 2019-09-03 DIAGNOSIS — R0789 Other chest pain: Secondary | ICD-10-CM

## 2019-09-03 NOTE — Patient Instructions (Addendum)
Medication Instructions:  The current medical regimen is effective;  continue present plan and medications as directed. Please refer to the Current Medication list given to you today.  *If you need a refill on your cardiac medications before your next appointment, please call your pharmacy*  Special Instructions PLEASE READ AND FOLLOW MINDFUL STRESS REDUCTION-ATTACHED  PLEASE READ AND FOLLOW SALTY 6-ATTACHED  Follow-Up: Your next appointment:  As needed with You may see Minus Breeding, MD, Coletta Memos, FNP-C or one of the following Advanced Practice Providers on your designated Care Team:  Rosaria Ferries, PA-C  Jory Sims, DNP, ANP Cadence Kathlen Mody, NP  At [facility], you and your health needs are our priority.  As part of our continuing mission to provide you with exceptional heart care, we have created designated Provider Care Teams.  These Care Teams include your primary Cardiologist (physician) and Advanced Practice Providers (APPs -  Physician Assistants and Nurse Practitioners) who all work together to provide you with the care you need, when you need it.        Mindfulness-Based Stress Reduction Mindfulness-based stress reduction (MBSR) is a program that helps people learn to practice mindfulness. Mindfulness is the practice of intentionally paying attention to the present moment. It can be learned and practiced through techniques such as education, breathing exercises, meditation, and yoga. MBSR includes several mindfulness techniques in one program. MBSR works best when you understand the treatment, are willing to try new things, and can commit to spending time practicing what you learn. MBSR training may include learning about:  How your emotions, thoughts, and reactions affect your body.  New ways to respond to things that cause negative thoughts to start (triggers).  How to notice your thoughts and let go of them.  Practicing awareness of everyday things that you  normally do without thinking.  The techniques and goals of different types of meditation. What are the benefits of MBSR? MBSR can have many benefits, which include helping you to:  Develop self-awareness. This refers to knowing and understanding yourself.  Learn skills and attitudes that help you to participate in your own health care.  Learn new ways to care for yourself.  Be more accepting about how things are, and let things go.  Be less judgmental and approach things with an open mind.  Be patient with yourself and trust yourself more. MBSR has also been shown to:  Reduce negative emotions, such as depression and anxiety.  Improve memory and focus.  Change how you sense and approach pain.  Boost your body's ability to fight infections.  Help you connect better with other people.  Improve your sense of well-being. Follow these instructions at home:   Find a local in-person or online MBSR program.  Set aside some time regularly for mindfulness practice.  Find a mindfulness practice that works best for you. This may include one or more of the following: ? Meditation. Meditation involves focusing your mind on a certain thought or activity. ? Breathing awareness exercises. These help you to stay present by focusing on your breath. ? Body scan. For this practice, you lie down and pay attention to each part of your body from head to toe. You can identify tension and soreness and intentionally relax parts of your body. ? Yoga. Yoga involves stretching and breathing, and it can improve your ability to move and be flexible. It can also provide an experience of testing your body's limits, which can help you release stress. ? Mindful eating. This  way of eating involves focusing on the taste, texture, color, and smell of each bite of food. Because this slows down eating and helps you feel full sooner, it can be an important part of a weight-loss plan.  Find a podcast or recording  that provides guidance for breathing awareness, body scan, or meditation exercises. You can listen to these any time when you have a free moment to rest without distractions.  Follow your treatment plan as told by your health care provider. This may include taking regular medicines and making changes to your diet or lifestyle as recommended. How to practice mindfulness To do a basic awareness exercise:  Find a comfortable place to sit.  Pay attention to the present moment. Observe your thoughts, feelings, and surroundings just as they are.  Avoid placing judgment on yourself, your feelings, or your surroundings. Make note of any judgment that comes up, and let it go.  Your mind may wander, and that is okay. Make note of when your thoughts drift, and return your attention to the present moment. To do basic mindfulness meditation:  Find a comfortable place to sit. This may include a stable chair or a firm floor cushion. ? Sit upright with your back straight. Let your arms fall next to your side with your hands resting on your legs. ? If sitting in a chair, rest your feet flat on the floor. ? If sitting on a cushion, cross your legs in front of you.  Keep your head in a neutral position with your chin dropped slightly. Relax your jaw and rest the tip of your tongue on the roof of your mouth. Drop your gaze to the floor. You can close your eyes if you like.  Breathe normally and pay attention to your breath. Feel the air moving in and out of your nose. Feel your belly expanding and relaxing with each breath.  Your mind may wander, and that is okay. Make note of when your thoughts drift, and return your attention to your breath.  Avoid placing judgment on yourself, your feelings, or your surroundings. Make note of any judgment or feelings that come up, let them go, and bring your attention back to your breath.  When you are ready, lift your gaze or open your eyes. Pay attention to how your  body feels after the meditation. Where to find more information You can find more information about MBSR from:  Your health care provider.  Community-based meditation centers or programs.  Programs offered near you. Summary  Mindfulness-based stress reduction (MBSR) is a program that teaches you how to intentionally pay attention to the present moment. It is used with other treatments to help you cope better with daily stress, emotions, and pain.  MBSR focuses on developing self-awareness, which allows you to respond to life stress without judgment or negative emotions.  MBSR programs may involve learning different mindfulness practices, such as breathing exercises, meditation, yoga, body scan, or mindful eating. Find a mindfulness practice that works best for you, and set aside time for it on a regular basis. This information is not intended to replace advice given to you by your health care provider. Make sure you discuss any questions you have with your health care provider.

## 2019-09-04 ENCOUNTER — Ambulatory Visit (INDEPENDENT_AMBULATORY_CARE_PROVIDER_SITE_OTHER): Payer: Medicare Other | Admitting: Family Medicine

## 2019-09-04 ENCOUNTER — Encounter: Payer: Self-pay | Admitting: Family Medicine

## 2019-09-04 VITALS — BP 109/64 | HR 72 | Temp 98.6°F | Ht 64.0 in | Wt 234.0 lb

## 2019-09-04 DIAGNOSIS — M1712 Unilateral primary osteoarthritis, left knee: Secondary | ICD-10-CM

## 2019-09-04 NOTE — Progress Notes (Signed)
BP 109/64   Pulse 72   Temp 98.6 F (37 C)   Ht '5\' 4"'  (1.626 m)   Wt 106.1 kg   SpO2 94%   BMI 40.17 kg/m    Subjective:   Patient ID: Karen Potter, female    DOB: 1940-12-15, 79 y.o.   MRN: [id number]  HPI: Karen Potter is a 79 y.o. female presenting on 09/04/2019 for Left Knee Pain.  Karen Potter presents to clinic today with a chief complaint of left knee pain x 1 week. The pain started abruptly [place]. She states that she heard a clicking sound but that she did not feel like the knee was going to give out on her. She reports that the knee was painful to the touch. [place] and an x-ray of her knee. She was told that there was inflammation and degenerative changes in the knee. She was given a knee wrap and advised to take Aleve. Both of these things have helped, but the knee wrap caused swelling in her left lower leg. Her pain level in the left knee today is 1/10.  Relevant past medical, surgical, family and social history reviewed and updated as indicated. Interim medical history since our last visit reviewed. Allergies and medications reviewed and updated.  Review of Systems  Constitutional: Negative for chills and fever.  Musculoskeletal: Positive for arthralgias. Negative for gait problem.  Skin: Negative for color change and wound.    Per HPI unless specifically indicated above   Allergies as of 09/04/2019      Reactions   Codeine Itching   Dilaudid [hydromorphone] Other (See Comments)   Mouth blisters   Other Itching   Most pain meds cause itching.  When has to take pain medication, she has been instructed to take Benadryl   Doxycycline Rash      Medication List       Accurate as of September 04, 2019  2:09 PM. If you have any questions, ask your nurse or doctor.        aspirin 325 MG tablet Take 325 mg by mouth every evening.   Blood Glucose Monitor System w/Device  Kit Use glucose meter to check blood sugar up to 3 times daily as instructed. One Touch Verio preferred by insurance. 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   clobetasol ointment 0.05 % Commonly known as: TEMOVATE APPLY TOPICALLY TWICE DAILY   clotrimazole-betamethasone cream Commonly known as: LOTRISONE Apply topically 2 (two) times daily.   ELDERBERRY PO Take by mouth.   Fish Oil Triple Strength 1400 MG Caps Take 1,000 mg by mouth 4 (four) times daily.   furosemide 40 MG tablet Commonly known as: LASIX Take 1 tablet (40 mg total) by mouth daily.   glucose blood test strip One Touch Verio- Use with glucose meter to check blood sugar up to 3 times daily as instructed. Dx: E11.69   Lancets Misc. Kit Use patient & insurance preferred lancet device to test blood glucose up to 3 times daily for Dx: E11.69   levothyroxine 150 MCG tablet Commonly known as: SYNTHROID Take 1 tablet (150 mcg total) by mouth daily.   losartan 25 MG tablet Commonly known as: Cozaar Take 1 tablet (25 mg total) by mouth daily.   naproxen sodium  220 MG tablet Commonly known as: ALEVE Take 220 mg by mouth as needed.   omeprazole 20 MG capsule Commonly known as: PRILOSEC Take 1 capsule (20 mg total) by mouth 2 (two) times daily before a meal.   tiZANidine 2 MG tablet Commonly known as: ZANAFLEX Take 1 tablet (2 mg total) by mouth 3 (three) times daily as needed for muscle spasms.   Vitamin D3 50 MCG (2000 UT) capsule Take 2,000 Units by mouth 2 (two) times daily.        Objective:   BP 109/64   Pulse 72   Temp 98.6 F (37 C)   Ht '5\' 4"'  (1.626 m)   Wt 106.1 kg   SpO2 94%   BMI 40.17 kg/m   Wt Readings from Last 3 Encounters:  09/04/19 106.1 kg  09/03/19 111.7 kg  08/16/19 108.9 kg    Physical Exam Constitutional:      General: She is not in acute distress.    Appearance: Normal  appearance.  Cardiovascular:     Rate and Rhythm: Normal rate and regular rhythm.     Pulses: Normal pulses.  Pulmonary:     Effort: Pulmonary effort is normal.     Breath sounds: Normal breath sounds.  Musculoskeletal:     Right knee: Normal.     Left knee: No swelling or deformity. No tenderness.     Instability Tests: Negative medial McMurray test and negative lateral McMurray test.     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 Slight edema noted in left lower leg.  Skin:    General: Skin is warm and dry.  Neurological:     Mental Status: She is alert and oriented to person, place, and time.     Coordination: Coordination normal.     Gait: Gait normal.  Psychiatric:        Mood and Affect: Mood normal.        Behavior: Behavior normal.        Thought Content: Thought content normal.        Judgment: Judgment normal.      Assessment & Plan:   Problem List Items Addressed This Visit    None    Visit Diagnoses    Primary osteoarthritis of left knee    -  Primary   Relevant Medications   naproxen sodium (ALEVE) 220 MG tablet      Likely an osteoarthritis flare due to [name]' HPI and physical exam findings. Advise conservative management with Aleve and/or other OTC anti-inflammatories. If symptoms fail to improve or worsen, Karen Potter is advised to call the office to follow up and plan next steps for treatment.  Follow up plan: Return if symptoms worsen or fail to improve.  Gaynelle Arabian, PA-S2 [facility] 09/04/2019, 2:09 PM

## 2019-11-07 ENCOUNTER — Ambulatory Visit (INDEPENDENT_AMBULATORY_CARE_PROVIDER_SITE_OTHER): Payer: Medicare Other | Admitting: Family Medicine

## 2019-11-07 ENCOUNTER — Encounter: Payer: Self-pay | Admitting: Family Medicine

## 2019-11-07 ENCOUNTER — Other Ambulatory Visit: Payer: Self-pay

## 2019-11-07 VITALS — BP 105/62 | HR 64 | Ht 64.0 in | Wt 245.0 lb

## 2019-11-07 DIAGNOSIS — E1159 Type 2 diabetes mellitus with other circulatory complications: Secondary | ICD-10-CM

## 2019-11-07 DIAGNOSIS — I152 Hypertension secondary to endocrine disorders: Secondary | ICD-10-CM

## 2019-11-07 DIAGNOSIS — E1169 Type 2 diabetes mellitus with other specified complication: Secondary | ICD-10-CM

## 2019-11-07 DIAGNOSIS — E039 Hypothyroidism, unspecified: Secondary | ICD-10-CM | POA: Diagnosis not present

## 2019-11-07 DIAGNOSIS — E785 Hyperlipidemia, unspecified: Secondary | ICD-10-CM | POA: Diagnosis not present

## 2019-11-07 DIAGNOSIS — I10 Essential (primary) hypertension: Secondary | ICD-10-CM | POA: Diagnosis not present

## 2019-11-07 LAB — BAYER DCA HB A1C WAIVED: HB A1C (BAYER DCA - WAIVED): 6.8 % (ref <7.0)

## 2019-11-07 NOTE — Progress Notes (Signed)
BP 105/62   Pulse 64   Ht '5\' 4"'  (1.626 m)   Wt 245 lb (111.1 kg)   SpO2 95%   BMI 42.05 kg/m    Subjective:   Patient ID: Karen Potter, female    DOB: 03-26-1941, 79 y.o.   MRN: [id number]  HPI: Karen Potter is a 79 y.o. female presenting on 11/07/2019 for Medical Management of Chronic Issues, Hypothyroidism, and Diabetes   HPI Type 2 diabetes mellitus Patient comes in today for recheck of his diabetes. Patient has been currently taking no medication currently and has been trying to diet control, A1c is 6.8 today. Patient is currently on an ACE inhibitor/ARB. Patient has not seen an ophthalmologist this year. Patient denies any issues with their feet. The symptom started onset as an adult hypothyroidism hypertension and hyperlipidemia ARE RELATED TO DM   Hypothyroidism recheck Patient is coming in for thyroid recheck today as well. They deny any issues with hair changes or heat or cold problems or diarrhea or constipation. They deny any chest pain or palpitations. They are currently on levothyroxine 150 micrograms   Hypertension Patient is currently on carvedilol and furosemide and losartan, and their blood pressure today is 105/62. Patient denies any lightheadedness or dizziness. Patient denies headaches, blurred vision, chest pains, shortness of breath, or weakness. Denies any side effects from medication and is content with current medication.   Hyperlipidemia Patient is coming in for recheck of his hyperlipidemia. The patient is currently taking fish oil, has been intolerant to statins. They deny any issues with myalgias or history of liver damage from it. They deny any focal numbness or weakness or chest pain.   Relevant past medical, surgical, family and social history reviewed and updated as indicated. Interim medical history since our last visit reviewed. Allergies and medications reviewed and updated.  Review of Systems  Constitutional: Negative for chills and fever.   Eyes: Negative for redness and visual disturbance.  Respiratory: Negative for chest tightness and shortness of breath.   Cardiovascular: Negative for chest pain and leg swelling.  Musculoskeletal: Positive for arthralgias (Arthritis in back and knees chronic, mild per patient).  Skin: Negative for rash.  Neurological: Negative for dizziness, light-headedness and headaches.  Psychiatric/Behavioral: Negative for agitation and behavioral problems.  All other systems reviewed and are negative.   Per HPI unless specifically indicated above   Allergies as of 11/07/2019      Reactions   Codeine Itching   Dilaudid [hydromorphone] Other (See Comments)   Mouth blisters   Other Itching   Most pain meds cause itching.  When has to take pain medication, she has been instructed to take Benadryl   Doxycycline Rash      Medication List       Accurate as of November 07, 2019 11:11 AM. If you have any questions, ask your nurse or doctor.        STOP taking these medications   clobetasol ointment 0.05 % Commonly known as: TEMOVATE Stopped by: Worthy Rancher, MD   clotrimazole-betamethasone cream Commonly known as: LOTRISONE Stopped by: Fransisca Kaufmann Karen Petkus, MD     TAKE these medications   aspirin 325 MG tablet Take 325 mg by mouth every evening.   Blood Glucose Monitor System w/Device Kit Use glucose meter to check blood sugar up to 3 times daily as instructed. One Touch Verio preferred by insurance. Dx: EE11.69   carvedilol 6.25 MG tablet Commonly known as: COREG Take 1 tablet (6.25 mg  total) by mouth 2 (two) times daily with a meal.   ELDERBERRY PO Take by mouth.   Fish Oil Triple Strength 1400 MG Caps Take 1,000 mg by mouth 4 (four) times daily.   furosemide 40 MG tablet Commonly known as: LASIX Take 1 tablet (40 mg total) by mouth daily.   glucose blood test strip One Touch Verio- Use with glucose meter to check blood sugar up to 3 times daily as instructed. Dx: E11.69    Lancets Misc. Kit Use patient & insurance preferred lancet device to test blood glucose up to 3 times daily for Dx: E11.69   levothyroxine 150 MCG tablet Commonly known as: SYNTHROID Take 1 tablet (150 mcg total) by mouth daily.   losartan 25 MG tablet Commonly known as: Cozaar Take 1 tablet (25 mg total) by mouth daily.   naproxen sodium 220 MG tablet Commonly known as: ALEVE Take 220 mg by mouth as needed.   omeprazole 20 MG capsule Commonly known as: PRILOSEC Take 1 capsule (20 mg total) by mouth 2 (two) times daily before a meal.   tiZANidine 2 MG tablet Commonly known as: ZANAFLEX Take 1 tablet (2 mg total) by mouth 3 (three) times daily as needed for muscle spasms.   Vitamin D3 50 MCG (2000 UT) capsule Take 2,000 Units by mouth 2 (two) times daily.        Objective:   BP 105/62   Pulse 64   Ht '5\' 4"'  (1.626 m)   Wt 245 lb (111.1 kg)   SpO2 95%   BMI 42.05 kg/m   Wt Readings from Last 3 Encounters:  11/07/19 245 lb (111.1 kg)  09/04/19 234 lb (106.1 kg)  09/03/19 246 lb 3.2 oz (111.7 kg)    Physical Exam Vitals and nursing note reviewed.  Constitutional:      General: She is not in acute distress.    Appearance: She is well-developed. She is not diaphoretic.  Eyes:     Conjunctiva/sclera: Conjunctivae normal.  Cardiovascular:     Rate and Rhythm: Normal rate and regular rhythm.     Heart sounds: Normal heart sounds. No murmur heard.   Pulmonary:     Effort: Pulmonary effort is normal. No respiratory distress.     Breath sounds: Normal breath sounds. No wheezing.  Musculoskeletal:        General: No tenderness. Normal range of motion.  Skin:    General: Skin is warm and dry.     Findings: No rash.  Neurological:     Mental Status: She is alert and oriented to person, place, and time.     Coordination: Coordination normal.  Psychiatric:        Behavior: Behavior normal.       Assessment & Plan:   Problem List Items Addressed This Visit        Cardiovascular and Mediastinum   Hypertension associated with diabetes ([place])     Endocrine   Hyperlipidemia associated with type 2 diabetes mellitus ([place])   Hypothyroid   Relevant Orders   TSH   Type 2 diabetes mellitus with other specified complication ([place]) - Primary   Relevant Orders   Bayer DCA Hb A1c Waived   BMP8+EGFR      A1c 6.8, discussed diet and lifestyle modification in which foods to avoid and discussed reducing [employer] and also recommended for patient to get exercising, water aerobics would be a great option and bicycles would be a great option that would hurt her knees hips  or back as much. Follow up plan: Return in about 3 months (around 02/07/2020), or if symptoms worsen or fail to improve, for Diabetes and thyroid recheck.  Counseling provided for all of the vaccine components Orders Placed This Encounter  Procedures  . Bayer DCA Hb A1c Waived  . TSH  . BMP8+EGFR    Caryl Pina, MD [facility] 11/07/2019, 11:11 AM

## 2019-11-08 LAB — BMP8+EGFR
BUN/Creatinine Ratio: 15 (ref 12–28)
BUN: 12 mg/dL (ref 8–27)
CO2: 24 mmol/L (ref 20–29)
Calcium: 9 mg/dL (ref 8.7–10.3)
Chloride: 102 mmol/L (ref 96–106)
Creatinine, Ser: 0.78 mg/dL (ref 0.57–1.00)
GFR calc Af Amer: 85 mL/min/{1.73_m2} (ref >59)
GFR calc non Af Amer: 74 mL/min/{1.73_m2} (ref >59)
Glucose: 136 mg/dL — ABNORMAL HIGH (ref 65–99)
Potassium: 4 mmol/L (ref 3.5–5.2)
Sodium: 141 mmol/L (ref 134–144)

## 2019-11-08 LAB — TSH: TSH: 2.43 u[IU]/mL (ref 0.450–4.500)

## 2019-11-20 DIAGNOSIS — H524 Presbyopia: Secondary | ICD-10-CM | POA: Diagnosis not present

## 2019-11-20 DIAGNOSIS — E119 Type 2 diabetes mellitus without complications: Secondary | ICD-10-CM | POA: Diagnosis not present

## 2019-12-03 ENCOUNTER — Ambulatory Visit (INDEPENDENT_AMBULATORY_CARE_PROVIDER_SITE_OTHER): Payer: Medicare Other

## 2019-12-03 DIAGNOSIS — Z0000 Encounter for general adult medical examination without abnormal findings: Secondary | ICD-10-CM | POA: Diagnosis not present

## 2019-12-03 NOTE — Progress Notes (Signed)
MEDICARE ANNUAL WELLNESS VISIT  12/03/2019  Telephone Visit Disclaimer This Medicare AWV was conducted by telephone due to national recommendations for restrictions regarding the COVID-19 Pandemic (e.g. social distancing).  I verified, using two identifiers, that I am speaking with Karen Potter or their authorized healthcare agent. I discussed the limitations, risks, security, and privacy concerns of performing an evaluation and management service by telephone and the potential availability of an in-person appointment in the future. The patient expressed understanding and agreed to proceed.   Subjective:  Karen Potter is a 79 y.o. female patient of Dettinger, Fransisca Kaufmann, MD who had a Medicare Annual Wellness Visit today via telephone. Karen Potter is Retired and lives with their spouse. she has four children. she reports that she is socially active and does interact with friends/family regularly. she is not physically.  Patient Care Team: Dettinger, Fransisca Kaufmann, MD as PCP - General (Family Medicine) Minus Breeding, MD as PCP - Cardiology (Cardiology) Clarene Essex, MD as Consulting Physician (Gastroenterology) McKenzie, Candee Furbish, MD as Consulting Physician (Urology) Ilean China, RN as Case Manager  Advanced Directives 12/03/2019 08/17/2019 10/12/2018 02/23/2018 08/29/2017 11/17/2015 08/29/2015  Does Patient Have a Medical Advance Directive? Yes No Yes No Yes Yes No;Yes  Type of Advance Directive Living will - Living will - Living will Living will Living will  Does patient want to make changes to medical advance directive? No - Patient declined - - - No - Patient declined - -  Copy of [place] in Chart? - - - - - No - copy requested No - copy requested  Would patient like information on creating a medical advance directive? - No - Patient declined - No - Patient declined - - No - patient declined information    Hospital Utilization Over the Past 12 Months: # of  hospitalizations or ER visits: 0 # of surgeries: 0  Review of Systems    Patient reports that her overall health is unchanged compared to last year.   Patient Reported Readings (BP, Pulse, CBG, Weight, etc) none  Pain Assessment Pain : No/denies pain     Current Medications & Allergies (verified) Allergies as of 12/03/2019      Reactions   Codeine Itching   Dilaudid [hydromorphone] Other (See Comments)   Mouth blisters   Other Itching   Most pain meds cause itching.  When has to take pain medication, she has been instructed to take Benadryl   Doxycycline Rash      Medication List       Accurate as of December 03, 2019  1:48 PM. If you have any questions, ask your nurse or doctor.        aspirin 325 MG tablet Take 325 mg by mouth every evening.   Blood Glucose Monitor System w/Device Kit Use glucose meter to check blood sugar up to 3 times daily as instructed. One Touch Verio preferred by insurance. 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   ELDERBERRY PO Take by mouth.   Fish Oil Triple Strength 1400 MG Caps Take 1,000 mg by mouth 4 (four) times daily.   furosemide 40 MG tablet Commonly known as: LASIX Take 1 tablet (40 mg total) by mouth daily.   glucose blood test strip One Touch Verio- Use with glucose meter to check blood sugar up to 3 times daily as  instructed. Dx: E11.69   Lancets Misc. Kit Use patient & insurance preferred lancet device to test blood glucose up to 3 times daily for Dx: E11.69   levothyroxine 150 MCG tablet Commonly known as: SYNTHROID Take 1 tablet (150 mcg total) by mouth daily.   losartan 25 MG tablet Commonly known as: Cozaar Take 1 tablet (25 mg total) by mouth daily.   naproxen sodium 220 MG tablet Commonly known as: ALEVE Take 220 mg by mouth as needed.   omeprazole 20 MG capsule Commonly known as: PRILOSEC Take 1 capsule (20  mg total) by mouth 2 (two) times daily before a meal.   tiZANidine 2 MG tablet Commonly known as: ZANAFLEX Take 1 tablet (2 mg total) by mouth 3 (three) times daily as needed for muscle spasms.   Vitamin D3 50 MCG (2000 UT) capsule Take 2,000 Units by mouth 2 (two) times daily.       History (reviewed): Past Medical History:  Diagnosis Date  . Duodenal ulcer without hemorrhage or perforation    08-29-2015 per EGD reort  . Fatty liver   . Gastric ulcer without hemorrhage or perforation    08-29-2015 per EGD report  . Gastritis    per EGD 08-29-2015  . GERD (gastroesophageal reflux disease)   . Hearing loss    no hearing aids  . 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 History of kidney stones   . History of TIA (transient ischemic attack)    03-23-2014  w/ episode temporay amnesia  . Hyperlipidemia   . Hypertension   . Hypothyroidism   . Iron deficiency anemia   . LAFB (left anterior fascicular block)   . OA (osteoarthritis)   . OSA on CPAP    severe per study 2003  . 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 RBBB (right bundle branch block)   . Seasonal allergies   . Type 2 diabetes mellitus ([place])   . Vitamin D deficiency    Past Surgical History:  Procedure Laterality Date  . CARPAL TUNNEL RELEASE Right 05-10-2007  . CHOLECYSTECTOMY OPEN  1985   and Appendectomy  . CYSTO/  RIGHT URETEROSCOPIC STONE EXTRACTON/  STENT PLACEMENT  06/ 2016   in [place]  . CYSTOSCOPY/RETROGRADE/URETEROSCOPY/STONE EXTRACTION WITH BASKET Right 11/17/2015   Procedure: CYSTOSCOPY/RETROGRADE PYELOGRAM RIGHT/DIAGNOSTIC RIGHT URETEROSCOPY/LASER RENAL CALCIFICATION/RIGHT STENT PLACEMENT;  Surgeon: Cleon Gustin, MD;  Location:  [facility];  Service: Urology;  Laterality: Right;  . ESOPHAGOGASTRODUODENOSCOPY N/A 08/29/2015   Procedure: ESOPHAGOGASTRODUODENOSCOPY (EGD);  Surgeon: Danie Binder, MD;  Location: [facility];  Service: Endoscopy;  Laterality: N/A;  . HOLMIUM LASER APPLICATION Right 7/40/8144   Procedure: HOLMIUM LASER APPLICATION;  Surgeon: Cleon Gustin, MD;  Location: [facility];  Service: Urology;  Laterality: Right;  . KNEE ARTHROSCOPY Right 1999  . LEFT HEEL REPAIR SURGERY  1954;  1985;  1995   polio  . LUMBAR SPINE SURGERY  09-01-2010   L4 -L5 fusion,  L3 laminectomy,  L5 - S1 foraminotomy  . OVARIAN CYST SURGERY Right 1966  . PARATHYROIDECTOMY N/A 05/06/2015   Procedure: PARATHYROIDECTOMY;  Surgeon: Armandina Gemma, MD;  Location: [facility];  Service: General;  Laterality: N/A;   left superior  . TONSILLECTOMY  as child  . TOTAL ABDOMINAL HYSTERECTOMY W/ BILATERAL SALPINGOOPHORECTOMY  1984  . TOTAL KNEE ARTHROPLASTY Right 04-08-2008  . TRANSTHORACIC ECHOCARDIOGRAM  04-07-2012   grade 1 diastolic function,  ef 40-98%/  mild AV calcification without stenosis/  trivial MR   Family History  Problem Relation Age of Onset  . Heart failure Mother        No details.  No MI  . Cancer Father        Lung  . Cancer Brother        Lung  . Stroke Maternal Grandfather   . Cancer Paternal Grandmother        rectal  . Cancer Paternal Grandfather        gastric   Social History   Socioeconomic History  . Marital status: Married    Spouse name: Lavell Luster  . Number of children: 4  . Years of education: 13-some college  . Highest education level: High school graduate  Occupational History  . Occupation: Retired  Tobacco Use  . Smoking status: Never Smoker  . Smokeless tobacco: Never Used  Vaping Use  . Vaping Use: Never used  Substance and Sexual Activity  . Alcohol use: No  . Drug use: No  . Sexual activity: Not Currently    Birth control/protection: Surgical    Other Topics Concern  . Not on file  Social History Narrative   Lives with husband.  Four children.     Social Determinants of Health   Financial Resource Strain:   . Difficulty of Paying Living Expenses:   Food Insecurity:   . Worried About Charity fundraiser in the Last Year:   . Arboriculturist in the Last Year:   Transportation Needs:   . Film/video editor (Medical):   Marland Kitchen Lack of Transportation (Non-Medical):   Physical Activity:   . Days of Exercise per Week:   . Minutes of Exercise per Session:   Stress:   . Feeling of Stress :   Social Connections:   . Frequency of Communication with Friends and Family:   . Frequency of Social Gatherings with Friends and Family:   . Attends Religious Services:   . Active Member of Clubs or Organizations:   . Attends Archivist Meetings:   Marland Kitchen Marital Status:     Activities of Daily Living In your present state of health, do you have any difficulty performing the following activities: 12/03/2019  Hearing? N  Vision? N  Difficulty concentrating or making decisions? N  Walking or climbing stairs? N  Dressing or bathing? N  Doing errands, shopping? N  Preparing Food and eating ? N  Using the Toilet? N  In the past six months, have you accidently leaked urine? N  Do you have problems with loss of bowel control? N  Managing your Medications? N  Managing your Finances? N  Housekeeping or managing your Housekeeping? N  Some recent data might be hidden    Patient Education/ Literacy How often do you need to have someone help you when you read instructions, pamphlets, or other written materials from your doctor or pharmacy?: 1 - Never What is the last grade level you completed in school?: Some College  Exercise Current Exercise Habits: The patient does not participate in regular exercise at present  Diet Patient reports consuming 2 meals a day  and 2 snack(s) a day Patient reports that her primary diet is:  Regular Patient reports that she does have regular access to food.   Depression Screen PHQ 2/9 Scores 12/03/2019 11/07/2019 09/04/2019 08/15/2019 08/03/2019 07/26/2019 11/02/2018  PHQ - 2 Score 0 0 0 0 0 0 0  PHQ- 9 Score - - - - - - -     Fall Risk Fall Risk  12/03/2019 11/07/2019 09/04/2019 08/15/2019 08/03/2019  Falls in the past year? 1 0 '1 1 1  ' Number falls in past yr: 0 - 0 1 0  Injury with Fall? 1 - 0 0 1  Comment - - - - Left knee  Risk Factor Category  - - - - -  Risk for fall due to : Impaired balance/gait - Impaired balance/gait - -  Follow up Falls evaluation completed - Falls evaluation completed Education provided -     Objective:  Karen Potter seemed alert and oriented and she participated appropriately during our telephone visit.  Blood Pressure Weight BMI  BP Readings from Last 3 Encounters:  11/07/19 105/62  09/04/19 109/64  09/03/19 128/64   Wt Readings from Last 3 Encounters:  11/07/19 245 lb (111.1 kg)  09/04/19 234 lb (106.1 kg)  09/03/19 246 lb 3.2 oz (111.7 kg)   BMI Readings from Last 1 Encounters:  11/07/19 42.05 kg/m    *Unable to obtain current vital signs, weight, and BMI due to telephone visit type  Hearing/Vision  . Ji did not seem to have difficulty with hearing/understanding during the telephone conversation . Reports that she has had a formal eye exam by an eye care professional within the past year . Reports that she has not had a formal hearing evaluation within the past year *Unable to fully assess hearing and vision during telephone visit type  Cognitive Function: 6CIT Screen 12/03/2019 10/12/2018  What Year? 0 points 0 points  What month? 0 points 0 points  What time? 0 points 0 points  Count back from 20 0 points 0 points  Months in reverse 0 points 0 points  Repeat phrase 2 points 0 points  Total Score 2 0   (Normal:0-7, Significant for Dysfunction: >8)  Normal Cognitive Function Screening: Yes   Immunization & Health  Maintenance Record Immunization History  Administered Date(s) Administered  . Fluad Quad(high Dose 65+) 01/18/2019  . Influenza, High Dose Seasonal PF 03/20/2013, 02/24/2016, 02/07/2017, 03/10/2018  . Influenza,inj,Quad PF,6+ Mos 02/19/2014, 02/06/2015  . Moderna SARS-COVID-2 Vaccination 07/04/2019, 08/01/2019  . Pneumococcal Conjugate-13 08/05/2014  . Pneumococcal Polysaccharide-23 02/07/2009  . Tdap 05/10/2010  . Zoster 05/10/2010    Health Maintenance  Topic Date Due  . OPHTHALMOLOGY EXAM  11/16/2019  . MAMMOGRAM  12/05/2019  . INFLUENZA VACCINE  12/09/2019  . HEMOGLOBIN A1C  05/08/2020  . TETANUS/TDAP  05/10/2020  . FOOT EXAM  08/02/2020  . COLONOSCOPY  10/15/2022  . DEXA SCAN  Completed  . COVID-19 Vaccine  Completed  . Hepatitis C Screening  Completed  . PNA vac Low Risk Adult  Completed       Assessment  This is a routine wellness examination for Karen Potter.  Health Maintenance: Due or Overdue Health Maintenance Due  Topic Date Due  . OPHTHALMOLOGY EXAM  11/16/2019    Karen Potter does not need a referral for Community Assistance: Care Management:   no Social Work:    no Prescription Assistance:  no Nutrition/Diabetes Education:  no   Plan:  Personalized Goals Goals Addressed  This Visit's Progress   . DIET - DECREASE SODA OR JUICE INTAKE   Not on track     Personalized Health Maintenance & Screening Recommendations    Lung Cancer Screening Recommended: no (Low Dose CT Chest recommended if Age 10-80 years, 30 pack-year currently smoking OR have quit w/in past 15 years) Hepatitis C Screening recommended: no HIV Screening recommended: no  Advanced Directives: Written information was not prepared per patient's request.  Referrals & Orders No orders of the defined types were placed in this encounter.   Follow-up Plan . Follow-up with Dettinger, Fransisca Kaufmann, MD as planned . Schedule 02/07/2020   I have personally reviewed and  noted the following in the patient's chart:   . Medical and social history . Use of alcohol, tobacco or illicit drugs  . Current medications and supplements . Functional ability and status . Nutritional status . Physical activity . Advanced directives . List of other physicians . Hospitalizations, surgeries, and ER visits in previous 12 months . Vitals . Screenings to include cognitive, depression, and falls . Referrals and appointments  In addition, I have reviewed and discussed with Karen Potter certain preventive protocols, quality metrics, and best practice recommendations. A written personalized care plan for preventive services as well as general preventive health recommendations is available and can be mailed to the patient at her request.      Maud Deed [facility]  2/70/[id number]

## 2019-12-03 NOTE — Patient Instructions (Signed)
  [facility] Maintenance Summary and Written Plan of Care  Ms. Mancel Bale ,  Thank you for allowing me to perform your Medicare Annual Wellness Visit and for your ongoing commitment to your health.   Health Maintenance & Immunization History Health Maintenance  Topic Date Due  . OPHTHALMOLOGY EXAM  11/16/2019  . MAMMOGRAM  12/05/2019  . INFLUENZA VACCINE  12/09/2019  . HEMOGLOBIN A1C  05/08/2020  . TETANUS/TDAP  05/10/2020  . FOOT EXAM  08/02/2020  . COLONOSCOPY  10/15/2022  . DEXA SCAN  Completed  . COVID-19 Vaccine  Completed  . Hepatitis C Screening  Completed  . PNA vac Low Risk Adult  Completed   Immunization History  Administered Date(s) Administered  . Fluad Quad(high Dose 65+) 01/18/2019  . Influenza, High Dose Seasonal PF 03/20/2013, 02/24/2016, 02/07/2017, 03/10/2018  . Influenza,inj,Quad PF,6+ Mos 02/19/2014, 02/06/2015  . Moderna SARS-COVID-2 Vaccination 07/04/2019, 08/01/2019  . Pneumococcal Conjugate-13 08/05/2014  . Pneumococcal Polysaccharide-23 02/07/2009  . Tdap 05/10/2010  . Zoster 05/10/2010    These are the patient goals that we discussed: Goals Addressed            This Visit's Progress   . DIET - DECREASE SODA OR JUICE INTAKE   Not on track       This is a list of Health Maintenance Items that are overdue or due now: Health Maintenance Due  Topic Date Due  . OPHTHALMOLOGY EXAM  11/16/2019     Orders/Referrals Placed Today: No orders of the defined types were placed in this encounter.  (Contact our referral department at [phone] if you have not spoken with someone about your referral appointment within the next 5 days)    Follow-up Plan  Scheduled 02/07/2020 with Dr. Warrick Parisian at 10:55am.

## 2020-01-01 DIAGNOSIS — H18413 Arcus senilis, bilateral: Secondary | ICD-10-CM | POA: Diagnosis not present

## 2020-01-01 DIAGNOSIS — H2513 Age-related nuclear cataract, bilateral: Secondary | ICD-10-CM | POA: Diagnosis not present

## 2020-01-01 DIAGNOSIS — H25013 Cortical age-related cataract, bilateral: Secondary | ICD-10-CM | POA: Diagnosis not present

## 2020-01-01 DIAGNOSIS — H2512 Age-related nuclear cataract, left eye: Secondary | ICD-10-CM | POA: Diagnosis not present

## 2020-01-01 DIAGNOSIS — H25043 Posterior subcapsular polar age-related cataract, bilateral: Secondary | ICD-10-CM | POA: Diagnosis not present

## 2020-01-19 DIAGNOSIS — Z20822 Contact with and (suspected) exposure to covid-19: Secondary | ICD-10-CM | POA: Diagnosis not present

## 2020-02-04 DIAGNOSIS — H2512 Age-related nuclear cataract, left eye: Secondary | ICD-10-CM | POA: Diagnosis not present

## 2020-02-04 DIAGNOSIS — H5202 Hypermetropia, left eye: Secondary | ICD-10-CM | POA: Diagnosis not present

## 2020-02-05 DIAGNOSIS — H25041 Posterior subcapsular polar age-related cataract, right eye: Secondary | ICD-10-CM | POA: Diagnosis not present

## 2020-02-05 DIAGNOSIS — H25011 Cortical age-related cataract, right eye: Secondary | ICD-10-CM | POA: Diagnosis not present

## 2020-02-05 DIAGNOSIS — H2511 Age-related nuclear cataract, right eye: Secondary | ICD-10-CM | POA: Diagnosis not present

## 2020-02-06 NOTE — Chronic Care Management (AMB) (Signed)
  Chronic Care Management   Outreach Note  01/10/2019 Name: Karen Potter MRN: [id number] DOB: 1940-07-03  Referred by: Dettinger, Fransisca Kaufmann, MD Reason for referral : Chronic Care Management (RNCM follow up)   An unsuccessful telephone outreach was attempted today. The patient was referred to the case management team for assistance with care management and care coordination.   Follow Up Plan: A HIPAA compliant phone message was left for the patient providing contact information and requesting a return call.  The care management team will reach out to the patient again over the next 30 days.   Chong Sicilian, BSN, RN-BC Embedded Chronic Care Manager [facility] Direct Dial: [phone]

## 2020-02-07 ENCOUNTER — Ambulatory Visit (INDEPENDENT_AMBULATORY_CARE_PROVIDER_SITE_OTHER): Payer: Medicare Other

## 2020-02-07 ENCOUNTER — Encounter: Payer: Self-pay | Admitting: Family Medicine

## 2020-02-07 ENCOUNTER — Other Ambulatory Visit: Payer: Self-pay

## 2020-02-07 ENCOUNTER — Ambulatory Visit (INDEPENDENT_AMBULATORY_CARE_PROVIDER_SITE_OTHER): Payer: Medicare Other | Admitting: Family Medicine

## 2020-02-07 VITALS — BP 132/75 | HR 71 | Temp 97.7°F | Ht 64.0 in | Wt 247.0 lb

## 2020-02-07 DIAGNOSIS — E039 Hypothyroidism, unspecified: Secondary | ICD-10-CM | POA: Diagnosis not present

## 2020-02-07 DIAGNOSIS — E785 Hyperlipidemia, unspecified: Secondary | ICD-10-CM | POA: Diagnosis not present

## 2020-02-07 DIAGNOSIS — M25472 Effusion, left ankle: Secondary | ICD-10-CM

## 2020-02-07 DIAGNOSIS — E1159 Type 2 diabetes mellitus with other circulatory complications: Secondary | ICD-10-CM

## 2020-02-07 DIAGNOSIS — I152 Hypertension secondary to endocrine disorders: Secondary | ICD-10-CM

## 2020-02-07 DIAGNOSIS — I10 Essential (primary) hypertension: Secondary | ICD-10-CM

## 2020-02-07 DIAGNOSIS — Z23 Encounter for immunization: Secondary | ICD-10-CM

## 2020-02-07 DIAGNOSIS — E1169 Type 2 diabetes mellitus with other specified complication: Secondary | ICD-10-CM

## 2020-02-07 DIAGNOSIS — M7989 Other specified soft tissue disorders: Secondary | ICD-10-CM | POA: Diagnosis not present

## 2020-02-07 LAB — BAYER DCA HB A1C WAIVED: HB A1C (BAYER DCA - WAIVED): 6.9 % (ref <7.0)

## 2020-02-07 NOTE — Progress Notes (Signed)
BP 132/75   Pulse 71   Temp 97.7 F (36.5 C)   Ht _0  (1.626 m)   Wt 247 lb (112 kg)   SpO2 97%   BMI 42.40 kg/m    Subjective:   Patient ID: Karen Potter, female    DOB: 05/30/1940, 79 y.o.   MRN: [id number]  HPI: Karen Potter is a 79 y.o. female presenting on 02/07/2020 for Medical Management of Chronic Issues and Diabetes   HPI Type 2 diabetes mellitus Patient comes in today for recheck of his diabetes. Patient has been currently taking no medication we have been monitoring and has been diet controlled, A1c is 6.9 which is slightly up and will monitor closely.. Patient is currently on an ACE inhibitor/ARB. Patient has seen an ophthalmologist this year. Patient denies any issues with their feet. The symptom started onset as an adult hypothyroidism and hypertension and hyperlipidemia ARE RELATED TO DM   Hypothyroidism recheck Patient is coming in for thyroid recheck today as well. They deny any issues with hair changes or heat or cold problems or diarrhea or constipation. They deny any chest pain or palpitations. They are currently on levothyroxine 150 micrograms   Hypertension Patient is currently on carvedilol and losartan, and their blood pressure today is 132/75. Patient denies any lightheadedness or dizziness. Patient denies headaches, blurred vision, chest pains, shortness of breath, or weakness. Denies any side effects from medication and is content with current medication.   Hyperlipidemia Patient is coming in for recheck of his hyperlipidemia. The patient is currently taking fish oils. They deny any issues with myalgias or history of liver damage from it. They deny any focal numbness or weakness or chest pain.   Patient comes in complaining of left ankle pain and swelling and pain with ambulation and that left ankle that is been going on over the past few days.  She says it is not getting any better and maybe even getting worse and kept her up last night.  She has  pain when she bears weight on it or when she moves it or when she touches it, the pain is mostly anterior left ankle and she has swelling and warmth.  Relevant past medical, surgical, family and social history reviewed and updated as indicated. Interim medical history since our last visit reviewed. Allergies and medications reviewed and updated.  Review of Systems  Constitutional: Negative for chills and fever.  Eyes: Negative for visual disturbance.  Respiratory: Negative for chest tightness and shortness of breath.   Cardiovascular: Negative for chest pain and leg swelling.  Musculoskeletal: Positive for arthralgias and joint swelling. Negative for back pain and gait problem.  Skin: Negative for rash.  Neurological: Negative for light-headedness and headaches.  Psychiatric/Behavioral: Negative for agitation and behavioral problems.  All other systems reviewed and are negative.   Per HPI unless specifically indicated above   Allergies as of 02/07/2020      Reactions   Codeine Itching   Dilaudid [hydromorphone] Other (See Comments)   Mouth blisters   Other Itching   Most pain meds cause itching.  When has to take pain medication, she has been instructed to take Benadryl   Doxycycline Rash      Medication List       Accurate as of February 07, 2020 11:38 AM. If you have any questions, ask your nurse or doctor.        STOP taking these medications   naproxen sodium 220 MG tablet Commonly  known as: ALEVE Stopped by: Worthy Rancher, MD   tiZANidine 2 MG tablet Commonly known as: ZANAFLEX Stopped by: Worthy Rancher, MD     TAKE these medications   acetaminophen 650 MG CR tablet Commonly known as: TYLENOL Take 650 mg by mouth in the morning.   ALLERGY MED PO Take by mouth daily.   aspirin 325 MG tablet Take 325 mg by mouth every evening.   Blood Glucose Monitor System w/Device Kit Use glucose meter to check blood sugar up to 3 times daily as instructed.  One Touch Verio preferred by insurance. 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   ELDERBERRY PO Take by mouth.   Fish Oil Triple Strength 1400 MG Caps Take 1,000 mg by mouth 4 (four) times daily.   furosemide 40 MG tablet Commonly known as: LASIX Take 1 tablet (40 mg total) by mouth daily.   glucose blood test strip One Touch Verio- Use with glucose meter to check blood sugar up to 3 times daily as instructed. Dx: E11.69   Lancets Misc. Kit Use patient & insurance preferred lancet device to test blood glucose up to 3 times daily for Dx: E11.69   levothyroxine 150 MCG tablet Commonly known as: SYNTHROID Take 1 tablet (150 mcg total) by mouth daily.   losartan 25 MG tablet Commonly known as: Cozaar Take 1 tablet (25 mg total) by mouth daily.   omeprazole 20 MG capsule Commonly known as: PRILOSEC Take 1 capsule (20 mg total) by mouth 2 (two) times daily before a meal.   Vitamin D3 50 MCG (2000 UT) capsule Take 2,000 Units by mouth 2 (two) times daily.        Objective:   BP 132/75   Pulse 71   Temp 97.7 F (36.5 C)   Ht _0  (1.626 m)   Wt 247 lb (112 kg)   SpO2 97%   BMI 42.40 kg/m   Wt Readings from Last 3 Encounters:  02/07/20 247 lb (112 kg)  11/07/19 245 lb (111.1 kg)  09/04/19 234 lb (106.1 kg)    Physical Exam Vitals and nursing note reviewed.  Constitutional:      General: She is not in acute distress.    Appearance: She is well-developed. She is not diaphoretic.  Eyes:     Conjunctiva/sclera: Conjunctivae normal.  Cardiovascular:     Rate and Rhythm: Normal rate and regular rhythm.     Heart sounds: Normal heart sounds. No murmur heard.   Pulmonary:     Effort: Pulmonary effort is normal. No respiratory distress.     Breath sounds: Normal breath sounds. No wheezing.  Musculoskeletal:        General: Normal range of motion.     Left ankle:  Swelling present. No deformity. Tenderness present over the ATF ligament and AITF ligament.     Left Achilles Tendon: Normal.  Skin:    General: Skin is warm and dry.     Findings: No rash.  Neurological:     Mental Status: She is alert and oriented to person, place, and time.     Coordination: Coordination normal.  Psychiatric:        Behavior: Behavior normal.       Assessment & Plan:   Problem List Items Addressed This Visit      Cardiovascular and Mediastinum   Hypertension associated with diabetes (  [place])     Endocrine   Hyperlipidemia associated with type 2 diabetes mellitus ([place])   Relevant Orders   Lipid panel   Hypothyroid   Relevant Orders   TSH   Type 2 diabetes mellitus with other specified complication ([place]) - Primary   Relevant Orders   Bayer DCA Hb A1c Waived (Completed)   Microalbumin / creatinine urine ratio   CBC with Differential/Platelet   CMP14+EGFR    Other Visit Diagnoses    Flu vaccine need       Relevant Orders   Flu Vaccine QUAD High Dose(Fluad) (Completed)   Left ankle swelling       Relevant Orders   Uric acid   DG Ankle Complete Left      Left ankle x-ray, await final read from radiologist  Consider possible gout for left ankle swelling, will test uric acid, if elevated consider colchicine.  Patient has a history of a GI bleed so will avoid too many NSAIDs for now.  Patient has left ankle swelling that is been bothering her over the past couple days.  Is very painful and painful to walk on. Follow up plan: Return in about 3 months (around 05/08/2020), or if symptoms worsen or fail to improve, for Diabetes and thyroid recheck.  Counseling provided for all of the vaccine components Orders Placed This Encounter  Procedures  . DG Ankle Complete Left  . Flu Vaccine QUAD High Dose(Fluad)  . Bayer DCA Hb A1c Waived  . Microalbumin / creatinine urine ratio  . CBC with Differential/Platelet  . CMP14+EGFR  . Lipid panel  . TSH  . Uric  acid    Caryl Pina, MD [facility] 02/07/2020, 11:38 AM

## 2020-02-08 ENCOUNTER — Telehealth: Payer: Self-pay

## 2020-02-08 LAB — CMP14+EGFR
ALT: 23 IU/L (ref 0–32)
AST: 17 IU/L (ref 0–40)
Albumin/Globulin Ratio: 1.3 (ref 1.2–2.2)
Albumin: 4 g/dL (ref 3.7–4.7)
Alkaline Phosphatase: 94 IU/L (ref 44–121)
BUN/Creatinine Ratio: 15 (ref 12–28)
BUN: 13 mg/dL (ref 8–27)
Bilirubin Total: 0.5 mg/dL (ref 0.0–1.2)
CO2: 27 mmol/L (ref 20–29)
Calcium: 9.1 mg/dL (ref 8.7–10.3)
Chloride: 100 mmol/L (ref 96–106)
Creatinine, Ser: 0.84 mg/dL (ref 0.57–1.00)
GFR calc Af Amer: 77 mL/min/{1.73_m2} (ref >59)
GFR calc non Af Amer: 67 mL/min/{1.73_m2} (ref >59)
Globulin, Total: 3.1 g/dL (ref 1.5–4.5)
Glucose: 142 mg/dL — ABNORMAL HIGH (ref 65–99)
Potassium: 4.1 mmol/L (ref 3.5–5.2)
Sodium: 141 mmol/L (ref 134–144)
Total Protein: 7.1 g/dL (ref 6.0–8.5)

## 2020-02-08 LAB — CBC WITH DIFFERENTIAL/PLATELET
Basophils Absolute: 0.1 10*3/uL (ref 0.0–0.2)
Basos: 1 %
EOS (ABSOLUTE): 0.3 10*3/uL (ref 0.0–0.4)
Eos: 4 %
Hematocrit: 37.9 % (ref 34.0–46.6)
Hemoglobin: 13 g/dL (ref 11.1–15.9)
Immature Grans (Abs): 0 10*3/uL (ref 0.0–0.1)
Immature Granulocytes: 0 %
Lymphocytes Absolute: 3.1 10*3/uL (ref 0.7–3.1)
Lymphs: 34 %
MCH: 31 pg (ref 26.6–33.0)
MCHC: 34.3 g/dL (ref 31.5–35.7)
MCV: 91 fL (ref 79–97)
Monocytes Absolute: 1.2 10*3/uL — ABNORMAL HIGH (ref 0.1–0.9)
Monocytes: 13 %
Neutrophils Absolute: 4.5 10*3/uL (ref 1.4–7.0)
Neutrophils: 48 %
Platelets: 253 10*3/uL (ref 150–450)
RBC: 4.19 x10E6/uL (ref 3.77–5.28)
RDW: 13.3 % (ref 11.7–15.4)
WBC: 9.1 10*3/uL (ref 3.4–10.8)

## 2020-02-08 LAB — LIPID PANEL
Chol/HDL Ratio: 4.4 ratio (ref 0.0–4.4)
Cholesterol, Total: 154 mg/dL (ref 100–199)
HDL: 35 mg/dL — ABNORMAL LOW (ref >39)
LDL Chol Calc (NIH): 85 mg/dL (ref 0–99)
Triglycerides: 199 mg/dL — ABNORMAL HIGH (ref 0–149)
VLDL Cholesterol Cal: 34 mg/dL (ref 5–40)

## 2020-02-08 LAB — URIC ACID: Uric Acid: 8.3 mg/dL — ABNORMAL HIGH (ref 3.1–7.9)

## 2020-02-08 LAB — TSH: TSH: 1.28 u[IU]/mL (ref 0.450–4.500)

## 2020-02-08 NOTE — Telephone Encounter (Signed)
Patient aware labs not reviewed yet

## 2020-02-11 ENCOUNTER — Telehealth: Payer: Self-pay

## 2020-02-11 DIAGNOSIS — H2511 Age-related nuclear cataract, right eye: Secondary | ICD-10-CM | POA: Diagnosis not present

## 2020-02-18 DIAGNOSIS — H2511 Age-related nuclear cataract, right eye: Secondary | ICD-10-CM | POA: Diagnosis not present

## 2020-02-25 ENCOUNTER — Other Ambulatory Visit: Payer: Self-pay | Admitting: Family Medicine

## 2020-02-25 DIAGNOSIS — E1159 Type 2 diabetes mellitus with other circulatory complications: Secondary | ICD-10-CM

## 2020-02-25 DIAGNOSIS — E1169 Type 2 diabetes mellitus with other specified complication: Secondary | ICD-10-CM

## 2020-02-25 DIAGNOSIS — I152 Hypertension secondary to endocrine disorders: Secondary | ICD-10-CM

## 2020-03-13 DIAGNOSIS — E876 Hypokalemia: Secondary | ICD-10-CM | POA: Diagnosis not present

## 2020-03-13 DIAGNOSIS — N390 Urinary tract infection, site not specified: Secondary | ICD-10-CM | POA: Diagnosis not present

## 2020-03-13 DIAGNOSIS — I10 Essential (primary) hypertension: Secondary | ICD-10-CM | POA: Diagnosis not present

## 2020-03-13 DIAGNOSIS — G9341 Metabolic encephalopathy: Secondary | ICD-10-CM | POA: Diagnosis not present

## 2020-03-13 DIAGNOSIS — N10 Acute tubulo-interstitial nephritis: Secondary | ICD-10-CM | POA: Diagnosis not present

## 2020-03-13 DIAGNOSIS — R739 Hyperglycemia, unspecified: Secondary | ICD-10-CM | POA: Diagnosis not present

## 2020-03-13 DIAGNOSIS — A419 Sepsis, unspecified organism: Secondary | ICD-10-CM | POA: Diagnosis not present

## 2020-03-13 DIAGNOSIS — R509 Fever, unspecified: Secondary | ICD-10-CM | POA: Diagnosis not present

## 2020-03-14 ENCOUNTER — Telehealth: Payer: Self-pay | Admitting: Family Medicine

## 2020-03-14 DIAGNOSIS — E1169 Type 2 diabetes mellitus with other specified complication: Secondary | ICD-10-CM

## 2020-03-14 DIAGNOSIS — I152 Hypertension secondary to endocrine disorders: Secondary | ICD-10-CM

## 2020-03-14 DIAGNOSIS — E1159 Type 2 diabetes mellitus with other circulatory complications: Secondary | ICD-10-CM

## 2020-03-14 MED ORDER — LOSARTAN POTASSIUM 25 MG PO TABS: 25.0000 mg | ORAL_TABLET | Freq: Every day | ORAL | 3 refills | Status: DC

## 2020-03-14 MED ORDER — FUROSEMIDE 40 MG PO TABS: 40.0000 mg | ORAL_TABLET | Freq: Every day | ORAL | 3 refills | Status: DC

## 2020-03-14 MED ORDER — LEVOTHYROXINE SODIUM 150 MCG PO TABS: 150.0000 ug | ORAL_TABLET | Freq: Every day | ORAL | 3 refills | Status: DC

## 2020-03-14 MED ORDER — OMEPRAZOLE 20 MG PO CPDR
20.0000 mg | DELAYED_RELEASE_CAPSULE | Freq: Two times a day (BID) | ORAL | 3 refills | Status: DC
Start: 2020-03-14 — End: 2021-02-24

## 2020-03-14 MED ORDER — CARVEDILOL 6.25 MG PO TABS: 6.2500 mg | ORAL_TABLET | Freq: Two times a day (BID) | ORAL | 3 refills | Status: DC

## 2020-03-14 NOTE — Telephone Encounter (Signed)
Pt needs prescriptions resent to [facility]

## 2020-03-14 NOTE — Telephone Encounter (Signed)
Pt called  [facility] is not pharm - was supposed to be [facility]   Will send now

## 2020-03-24 ENCOUNTER — Encounter: Payer: Self-pay | Admitting: Family Medicine

## 2020-03-24 ENCOUNTER — Other Ambulatory Visit: Payer: Self-pay

## 2020-03-24 ENCOUNTER — Ambulatory Visit (INDEPENDENT_AMBULATORY_CARE_PROVIDER_SITE_OTHER): Payer: Medicare Other | Admitting: Family Medicine

## 2020-03-24 VITALS — BP 129/73 | HR 69 | Temp 97.4°F | Ht 64.0 in | Wt 238.0 lb

## 2020-03-24 DIAGNOSIS — N3000 Acute cystitis without hematuria: Secondary | ICD-10-CM

## 2020-03-24 LAB — CBC WITH DIFFERENTIAL/PLATELET
Basophils Absolute: 0.1 10*3/uL (ref 0.0–0.2)
Basos: 1 %
EOS (ABSOLUTE): 0.3 10*3/uL (ref 0.0–0.4)
Eos: 4 %
Hematocrit: 40.4 % (ref 34.0–46.6)
Hemoglobin: 13.7 g/dL (ref 11.1–15.9)
Immature Grans (Abs): 0 10*3/uL (ref 0.0–0.1)
Immature Granulocytes: 0 %
Lymphocytes Absolute: 3.1 10*3/uL (ref 0.7–3.1)
Lymphs: 38 %
MCH: 30.9 pg (ref 26.6–33.0)
MCHC: 33.9 g/dL (ref 31.5–35.7)
MCV: 91 fL (ref 79–97)
Monocytes Absolute: 0.9 10*3/uL (ref 0.1–0.9)
Monocytes: 11 %
Neutrophils Absolute: 3.8 10*3/uL (ref 1.4–7.0)
Neutrophils: 46 %
Platelets: 360 10*3/uL (ref 150–450)
RBC: 4.43 x10E6/uL (ref 3.77–5.28)
RDW: 13.2 % (ref 11.7–15.4)
WBC: 8.2 10*3/uL (ref 3.4–10.8)

## 2020-03-24 LAB — CMP14+EGFR
ALT: 31 IU/L (ref 0–32)
AST: 22 IU/L (ref 0–40)
Albumin/Globulin Ratio: 1.3 (ref 1.2–2.2)
Albumin: 4.1 g/dL (ref 3.7–4.7)
Alkaline Phosphatase: 96 IU/L (ref 44–121)
BUN/Creatinine Ratio: 18 (ref 12–28)
BUN: 18 mg/dL (ref 8–27)
Bilirubin Total: 0.3 mg/dL (ref 0.0–1.2)
CO2: 25 mmol/L (ref 20–29)
Calcium: 9.8 mg/dL (ref 8.7–10.3)
Chloride: 99 mmol/L (ref 96–106)
Creatinine, Ser: 1 mg/dL (ref 0.57–1.00)
GFR calc Af Amer: 62 mL/min/{1.73_m2} (ref >59)
GFR calc non Af Amer: 54 mL/min/{1.73_m2} — ABNORMAL LOW (ref >59)
Globulin, Total: 3.2 g/dL (ref 1.5–4.5)
Glucose: 132 mg/dL — ABNORMAL HIGH (ref 65–99)
Potassium: 4 mmol/L (ref 3.5–5.2)
Sodium: 141 mmol/L (ref 134–144)
Total Protein: 7.3 g/dL (ref 6.0–8.5)

## 2020-03-24 NOTE — Progress Notes (Signed)
BP 129/73   Pulse 69   Temp (!) 97.4 F (36.3 C)   Ht '5\' 4"'  (1.626 m)   Wt 238 lb (108 kg)   SpO2 98%   BMI 40.85 kg/m    Subjective:   Patient ID: Karen Potter, female    DOB: [birth year], 79 y.o.   MRN: [id number]  HPI: Karen Potter is a 79 y.o. female presenting on 03/24/2020 for Hospitalization Follow-up (Sepsis)   HPI Patient is coming in today for hospital follow-up for UTI and sepsis.  She was down in [place] on vacation and it looks like she was admitted on 03/14/2020 and discharged on 03/17/2020.  She was found to have her her positive blood cultures although I do not have those records.  She did have positive urine culture for E. coli and is currently on cephalexin which is what they sent her home with.  She feels like things are improving and her energy is starting to come back.  She says her only symptoms initially was that she was sleeping tired all day.  She says her energy is coming back from that.  She denied any really having any burning or anything major.  Relevant past medical, surgical, family and social history reviewed and updated as indicated. Interim medical history since our last visit reviewed. Allergies and medications reviewed and updated.  Review of Systems  Constitutional: Positive for fatigue. Negative for chills and fever.  Eyes: Negative for visual disturbance.  Respiratory: Negative for chest tightness and shortness of breath.   Cardiovascular: Negative for chest pain and leg swelling.  Genitourinary: Negative for difficulty urinating, dysuria, flank pain and hematuria.  Musculoskeletal: Negative for back pain and gait problem.  Skin: Negative for rash.  Neurological: Negative for light-headedness and headaches.  Psychiatric/Behavioral: Negative for agitation and behavioral problems.  All other systems reviewed and are negative.   Per HPI unless specifically indicated above   Allergies as of 03/24/2020      Reactions   Codeine  Itching   Dilaudid [hydromorphone] Other (See Comments)   Mouth blisters   Other Itching   Most pain meds cause itching.  When has to take pain medication, she has been instructed to take Benadryl   Doxycycline Rash      Medication List       Accurate as of March 24, 2020  9:07 AM. If you have any questions, ask your nurse or doctor.        acetaminophen 650 MG CR tablet Commonly known as: TYLENOL Take 650 mg by mouth in the morning.   Align 4 MG Caps Take 1 capsule by mouth daily.   ALLERGY MED PO Take by mouth daily.   aspirin 325 MG tablet Take 325 mg by mouth every evening.   Blood Glucose Monitor System w/Device Kit Use glucose meter to check blood sugar up to 3 times daily as instructed. One Touch Verio preferred by insurance. 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   ELDERBERRY PO Take by mouth.   Fish Oil Triple Strength 1400 MG Caps Take 1,000 mg by mouth 4 (four) times daily.   furosemide 40 MG tablet Commonly known as: LASIX Take 1 tablet (40 mg total) by mouth daily.   glucose blood test strip One Touch Verio- Use with glucose meter to check blood sugar up to 3 times daily as  instructed. Dx: E11.69   Lancets Misc. Kit Use patient & insurance preferred lancet device to test blood glucose up to 3 times daily for Dx: E11.69   levothyroxine 150 MCG tablet Commonly known as: Euthyrox Take 1 tablet (150 mcg total) by mouth daily.   losartan 25 MG tablet Commonly known as: COZAAR Take 1 tablet (25 mg total) by mouth daily.   omeprazole 20 MG capsule Commonly known as: PRILOSEC Take 1 capsule (20 mg total) by mouth 2 (two) times daily before a meal.   Vitamin D3 50 MCG (2000 UT) capsule Take 2,000 Units by mouth 2 (two) times daily.        Objective:   There were no vitals taken for this visit.  Wt Readings from Last 3 Encounters:  02/07/20  247 lb (112 kg)  11/07/19 245 lb (111.1 kg)  09/04/19 234 lb (106.1 kg)    Physical Exam Vitals and nursing note reviewed.  Constitutional:      General: She is not in acute distress.    Appearance: She is well-developed. She is not diaphoretic.  Eyes:     Conjunctiva/sclera: Conjunctivae normal.  Cardiovascular:     Rate and Rhythm: Normal rate and regular rhythm.     Heart sounds: Normal heart sounds. No murmur heard.   Pulmonary:     Effort: Pulmonary effort is normal. No respiratory distress.     Breath sounds: Normal breath sounds. No wheezing.  Musculoskeletal:        General: No tenderness. Normal range of motion.  Skin:    General: Skin is warm and dry.     Findings: No rash.  Neurological:     Mental Status: She is alert and oriented to person, place, and time.     Coordination: Coordination normal.  Psychiatric:        Behavior: Behavior normal.       Assessment & Plan:   Problem List Items Addressed This Visit    None    Visit Diagnoses    Acute cystitis without hematuria    -  Primary   Relevant Orders   CBC with Differential/Platelet   CMP14+EGFR      Will recheck CBC and CHEM, patient is still taking the Keflex so we will have her continue with that. Follow up plan: Return if symptoms worsen or fail to improve.  Counseling provided for all of the vaccine components No orders of the defined types were placed in this encounter.   Caryl Pina, MD [facility] 03/24/2020, 9:07 AM

## 2020-06-05 ENCOUNTER — Ambulatory Visit: Payer: Medicare Other | Admitting: Adult Health

## 2020-06-05 ENCOUNTER — Encounter: Payer: Self-pay | Admitting: Adult Health

## 2020-06-05 VITALS — BP 134/74 | HR 72 | Ht 64.0 in | Wt 249.0 lb

## 2020-06-05 DIAGNOSIS — Z9989 Dependence on other enabling machines and devices: Secondary | ICD-10-CM | POA: Diagnosis not present

## 2020-06-05 DIAGNOSIS — G4733 Obstructive sleep apnea (adult) (pediatric): Secondary | ICD-10-CM

## 2020-06-05 NOTE — Patient Instructions (Signed)
Continue using CPAP nightly and greater than 4 hours each night °If your symptoms worsen or you develop new symptoms please let us know.  ° °

## 2020-06-05 NOTE — Progress Notes (Signed)
PATIENT: Karen Potter DOB: [birth year]  REASON FOR VISIT: follow up HISTORY FROM: patient  HISTORY OF PRESENT ILLNESS: Today 06/05/20:  Karen Potter is a 80 year old female with a history of obstructive sleep apnea on CPAP.  She returns today for follow-up.  She reports that she is using machine nightly.  Denies any new issues.  Her download is below.  Compliance Report Usage 05/06/2020 - 06/04/2020 Usage days 30/30 days (100%) >= 4 hours 30 days (100%) Average usage (days used) 7 hours 40 minutes   AirSense 10 AutoSet Serial number [id number] Mode CPAP Set pressure 12 cmH2O EPR Fulltime EPR level 1  Therapy Leaks - L/min Median: 1.9 95th percentile: 12.4 Maximum: 25.4 Events per hour AI: 0.6 HI: 0.2 AHI: 0.8 Apnea Index Central: 0.1 Obstructive: 0.4 Unknown: 0.0  HISTORY 06/05/19:  Karen Potter is a 80 year old female with a history of obstructive sleep apnea on CPAP.  Her download indicates that she used her machine 30 out of 30 days for compliance of 100%.  She use her machine greater than 4 hours 29 days for compliance of 97%.  On average she uses her machine 7 hours and 16 minutes.  Her residual AHI is 0.7 on 12 cm of water with EPR of 1 her leak in the 95th percentile is 15.1 L/min.  She feels that the machine is working well for her.  She returns today for an evaluation  REVIEW OF SYSTEMS: Out of a complete 14 system review of symptoms, the patient complains only of the following symptoms, and all other reviewed systems are negative.   ESS 13  ALLERGIES: Allergies  Allergen Reactions  . Codeine Itching  . Dilaudid [Hydromorphone] Other (See Comments)    Mouth blisters  . Other Itching    Most pain meds cause itching.  When has to take pain medication, she has been instructed to take Benadryl  . Doxycycline Rash    HOME MEDICATIONS: Outpatient Medications Prior to Visit  Medication Sig Dispense Refill  . acetaminophen (TYLENOL) 650 MG CR tablet Take  650 mg by mouth in the morning.    Marland Kitchen aspirin 325 MG tablet Take 325 mg by mouth every evening.     . Blood Glucose Monitoring Suppl (BLOOD GLUCOSE MONITOR SYSTEM) w/Device KIT Use glucose meter to check blood sugar up to 3 times daily as instructed. One Touch Verio preferred by insurance. Dx: EE11.69 1 each 0  . carvedilol (COREG) 6.25 MG tablet Take 1 tablet (6.25 mg total) by mouth 2 (two) times daily with a meal. 180 tablet 3  . Cholecalciferol (VITAMIN D3) 2000 UNITS capsule Take 2,000 Units by mouth 2 (two) times daily.     . diphenhydrAMINE HCl (ALLERGY MED PO) Take by mouth daily.    Marland Kitchen ELDERBERRY PO Take by mouth.    . furosemide (LASIX) 40 MG tablet Take 1 tablet (40 mg total) by mouth daily. 90 tablet 3  . glucose blood test strip One Touch Verio- Use with glucose meter to check blood sugar up to 3 times daily as instructed. Dx: E11.69 100 each 12  . Lancets Misc. KIT Use patient & insurance preferred lancet device to test blood glucose up to 3 times daily for Dx: E11.69 100 each 12  . levothyroxine (EUTHYROX) 150 MCG tablet Take 1 tablet (150 mcg total) by mouth daily. 90 tablet 3  . losartan (COZAAR) 25 MG tablet Take 1 tablet (25 mg total) by mouth daily. 90 tablet 3  . Omega-3 Fatty  Acids (FISH OIL TRIPLE STRENGTH) 1400 MG CAPS Take 1,000 mg by mouth 4 (four) times daily.     Marland Kitchen omeprazole (PRILOSEC) 20 MG capsule Take 1 capsule (20 mg total) by mouth 2 (two) times daily before a meal. 180 capsule 3  . cephALEXin (KEFLEX) 750 MG capsule Take 750 mg by mouth 2 (two) times daily.    . Probiotic Product (ALIGN) 4 MG CAPS Take 1 capsule by mouth daily.     No facility-administered medications prior to visit.    PAST MEDICAL HISTORY: Past Medical History:  Diagnosis Date  . Duodenal ulcer without hemorrhage or perforation    08-29-2015 per EGD reort  . Fatty liver   . Gastric ulcer without hemorrhage or perforation    08-29-2015 per EGD report  . Gastritis    per EGD 08-29-2015   . GERD (gastroesophageal reflux disease)   . Hearing loss    no hearing aids  . 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 History of kidney stones   . History of TIA (transient ischemic attack)    03-23-2014  w/ episode temporay amnesia  . Hyperlipidemia   . Hypertension   . Hypothyroidism   . Iron deficiency anemia   . LAFB (left anterior fascicular block)   . OA (osteoarthritis)   . OSA on CPAP    severe per study 2003  . 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 RBBB (right bundle branch block)   . Seasonal allergies   . Type 2 diabetes mellitus ([place])   . Vitamin D deficiency     PAST SURGICAL HISTORY: Past Surgical History:  Procedure Laterality Date  . CARPAL TUNNEL RELEASE Right 05-10-2007  . CHOLECYSTECTOMY OPEN  1985   and Appendectomy  . CYSTO/  RIGHT URETEROSCOPIC STONE EXTRACTON/  STENT PLACEMENT  06/ 2016   in [place]  . CYSTOSCOPY/RETROGRADE/URETEROSCOPY/STONE EXTRACTION WITH BASKET Right 11/17/2015   Procedure: CYSTOSCOPY/RETROGRADE PYELOGRAM RIGHT/DIAGNOSTIC RIGHT URETEROSCOPY/LASER RENAL CALCIFICATION/RIGHT STENT PLACEMENT;  Surgeon: Cleon Gustin, MD;  Location: [facility];  Service: Urology;  Laterality: Right;  . ESOPHAGOGASTRODUODENOSCOPY N/A 08/29/2015   Procedure: ESOPHAGOGASTRODUODENOSCOPY (EGD);  Surgeon: Danie Binder, MD;  Location[facility];  Service: Endoscopy;  Laterality: N/A;  . HOLMIUM LASER APPLICATION Right 5/69/7948   Procedure: HOLMIUM LASER APPLICATION;  Surgeon: Cleon Gustin, MD;  Location: [facility];  Service: Urology;  Laterality: Right;  . KNEE ARTHROSCOPY Right 1999  . LEFT HEEL REPAIR SURGERY  1954;  1985;  1995   polio  .  LUMBAR SPINE SURGERY  09-01-2010   L4 -L5 fusion,  L3 laminectomy,  L5 - S1 foraminotomy  . OVARIAN CYST SURGERY Right 1966  . PARATHYROIDECTOMY N/A 05/06/2015   Procedure: PARATHYROIDECTOMY;  Surgeon: Armandina Gemma, MD;  Location: [facility];  Service: General;  Laterality: N/A;   left superior  . TONSILLECTOMY  as child  . TOTAL ABDOMINAL HYSTERECTOMY W/ BILATERAL SALPINGOOPHORECTOMY  1984  . TOTAL KNEE ARTHROPLASTY Right 04-08-2008  . TRANSTHORACIC ECHOCARDIOGRAM  04-07-2012   grade 1 diastolic function,  ef 01-65%/  mild AV calcification without stenosis/  trivial MR    FAMILY HISTORY: Family History  Problem Relation Age of Onset  . Heart failure Mother        No details.  No MI  . Cancer Father        Lung  . Cancer Brother        Lung  . Stroke Maternal Grandfather   . Cancer Paternal Grandmother        rectal  . Cancer Paternal Grandfather        gastric    SOCIAL HISTORY: Social History   Socioeconomic History  . Marital status: Married    Spouse name: Lavell Luster  . Number of children: 4  . Years of education: 13-some college  . Highest education level: High school graduate  Occupational History  . Occupation: Retired  Tobacco Use  . Smoking status: Never Smoker  . Smokeless tobacco: Never Used  Vaping Use  . Vaping Use: Never used  Substance and Sexual Activity  . Alcohol use: No  . Drug use: No  . Sexual activity: Not Currently    Birth control/protection: Surgical  Other Topics Concern  . Not on file  Social History Narrative   Lives with husband.  Four children.     Social Determinants of Health   Financial Resource Strain: Not on file  Food Insecurity: Not on file  Transportation Needs: Not on file  Physical Activity: Not on file  Stress: Not on file  Social Connections: Not on file  Intimate Partner Violence: Not on file      PHYSICAL EXAM  Vitals:   06/05/20 1427  BP: 134/74  Pulse: 72  Weight: 249 lb (112.9 kg)  Height: '5\' 4"'  (1.626 m)    Body mass index is 42.74 kg/m.  Generalized: Well developed, in no acute distress  Chest: Lungs clear to auscultation bilaterally  Neurological examination  Mentation: Alert oriented to time, place, history taking. Follows all commands speech and language fluent Cranial nerve II-XII: Extraocular movements were full, visual field were full on confrontational test Head turning and shoulder shrug  were normal and symmetric. Motor: The motor testing reveals 5 over 5 strength of all 4 extremities. Good symmetric motor tone is noted throughout.  Sensory: Sensory testing is intact to soft touch on all 4 extremities. No evidence of extinction is noted.  Gait and station: Gait is normal.    DIAGNOSTIC DATA (LABS, IMAGING, TESTING) - I reviewed patient records, labs, notes, testing and imaging myself where available.  Lab Results  Component Value Date   WBC 8.2 03/24/2020   HGB 13.7 03/24/2020   HCT 40.4 03/24/2020   MCV 91 03/24/2020   PLT 360 03/24/2020      Component Value Date/Time   NA 141 03/24/2020 0946   K 4.0 03/24/2020 0946   CL 99 03/24/2020 0946   CO2 25 03/24/2020 0946   GLUCOSE 132 (H) 03/24/2020 0946   GLUCOSE 152 (H) 08/16/2019 2354   BUN 18 03/24/2020 0946   CREATININE 1.00 03/24/2020 0946   CREATININE 0.73 10/03/2012 1310   CALCIUM 9.8 03/24/2020 0946   PROT 7.3 03/24/2020 0946   ALBUMIN 4.1 03/24/2020 0946   AST 22 03/24/2020 0946   ALT 31 03/24/2020 0946   ALKPHOS 96 03/24/2020 0946   BILITOT 0.3 03/24/2020 0946   GFRNONAA 54 (L) 03/24/2020 0946   GFRNONAA 84 10/03/2012 1310   GFRAA 62 03/24/2020 0946   GFRAA >89 10/03/2012 1310   Lab Results  Component Value Date  CHOL 154 02/07/2020   HDL 35 (L) 02/07/2020   LDLCALC 85 02/07/2020   TRIG 199 (H) 02/07/2020   CHOLHDL 4.4 02/07/2020   Lab Results  Component Value Date   HGBA1C 6.9 02/07/2020   Lab Results  Component Value Date   VITAMINB12 262 08/28/2015   Lab Results  Component Value  Date   TSH 1.280 02/07/2020      ASSESSMENT AND PLAN 80 y.o. year old female  has a past medical history of Duodenal ulcer without hemorrhage or perforation, Fatty liver, Gastric ulcer without hemorrhage or perforation, Gastritis, GERD (gastroesophageal reflux disease), Hearing loss, History of benign parathyroid tumor, History of hepatitis B (?B), History of kidney stones, History of TIA (transient ischemic attack), Hyperlipidemia, Hypertension, Hypothyroidism, Iron deficiency anemia, LAFB (left anterior fascicular block), OA (osteoarthritis), OSA on CPAP, Polio, Post-polio limb muscle weakness, RBBB (right bundle branch block), Seasonal allergies, Type 2 diabetes mellitus ([place]), and Vitamin D deficiency. here with:  1. OSA on CPAP  - CPAP compliance excellent - Good treatment of AHI  - Encourage patient to use CPAP nightly and > 4 hours each night - F/U in 1 year or sooner if needed   I spent 52mnutes of face-to-face and non-face-to-face time with patient.  This included previsit chart review, lab review, study review, order entry, electronic health record documentation, patient education.  MWard Givens MSN, NP-C 06/05/2020, 2:31 PM [facility] [address] [place] [phone]

## 2020-06-10 ENCOUNTER — Ambulatory Visit: Payer: Medicare Other | Admitting: Nurse Practitioner

## 2020-09-03 ENCOUNTER — Other Ambulatory Visit: Payer: Self-pay

## 2020-09-03 ENCOUNTER — Encounter: Payer: Self-pay | Admitting: Family Medicine

## 2020-09-03 ENCOUNTER — Ambulatory Visit (INDEPENDENT_AMBULATORY_CARE_PROVIDER_SITE_OTHER): Payer: Medicare Other | Admitting: Family Medicine

## 2020-09-03 VITALS — BP 146/83 | HR 63 | Ht 64.0 in | Wt 248.0 lb

## 2020-09-03 DIAGNOSIS — E785 Hyperlipidemia, unspecified: Secondary | ICD-10-CM

## 2020-09-03 DIAGNOSIS — M609 Myositis, unspecified: Secondary | ICD-10-CM

## 2020-09-03 DIAGNOSIS — E1159 Type 2 diabetes mellitus with other circulatory complications: Secondary | ICD-10-CM | POA: Diagnosis not present

## 2020-09-03 DIAGNOSIS — E039 Hypothyroidism, unspecified: Secondary | ICD-10-CM

## 2020-09-03 DIAGNOSIS — T466X5A Adverse effect of antihyperlipidemic and antiarteriosclerotic drugs, initial encounter: Secondary | ICD-10-CM | POA: Diagnosis not present

## 2020-09-03 DIAGNOSIS — E1169 Type 2 diabetes mellitus with other specified complication: Secondary | ICD-10-CM | POA: Diagnosis not present

## 2020-09-03 DIAGNOSIS — I152 Hypertension secondary to endocrine disorders: Secondary | ICD-10-CM | POA: Diagnosis not present

## 2020-09-03 LAB — BAYER DCA HB A1C WAIVED: HB A1C (BAYER DCA - WAIVED): 7 % — ABNORMAL HIGH (ref <7.0)

## 2020-09-03 NOTE — Progress Notes (Signed)
BP (!) 146/83   Pulse 63   Ht '5\' 4"'  (1.626 m)   Wt 248 lb (112.5 kg)   SpO2 95%   BMI 42.57 kg/m    Subjective:   Patient ID: Karen Potter, female    DOB: [birth year], 80 y.o.   MRN: [id number]  HPI: Karen Potter is a 80 y.o. female presenting on 09/03/2020 for Medical Management of Chronic Issues, Diabetes, and Hypothyroidism   HPI Type 2 diabetes mellitus Patient comes in today for recheck of his diabetes. Patient has been currently taking no medication has been diet controlled, A1c is up just a hair at 7.0. Patient is currently on an ACE inhibitor/ARB. Patient has not seen an ophthalmologist this year. Patient denies any issues with their feet. The symptom started onset as an adult hypothyroidism hypertension hyperlipidemia ARE RELATED TO DM   Hypothyroidism recheck Patient is coming in for thyroid recheck today as well. They deny any issues with hair changes or heat or cold problems or diarrhea or constipation. They deny any chest pain or palpitations. They are currently on levothyroxine 150 micrograms   Hypertension Patient is currently on losartan and carvedilol, and their blood pressure today is 146/83. Patient denies any lightheadedness or dizziness. Patient denies headaches, blurred vision, chest pains, shortness of breath, or weakness. Denies any side effects from medication and is content with current medication.   Hyperlipidemia Patient is coming in for recheck of his hyperlipidemia. The patient is currently taking fish oil. They deny any issues with myalgias or history of liver damage from it. They deny any focal numbness or weakness or chest pain.   Relevant past medical, surgical, family and social history reviewed and updated as indicated. Interim medical history since our last visit reviewed. Allergies and medications reviewed and updated.  Review of Systems  Constitutional: Negative for chills and fever.  Eyes: Negative for visual disturbance.  Respiratory:  Negative for chest tightness and shortness of breath.   Cardiovascular: Negative for chest pain and leg swelling.  Musculoskeletal: Negative for back pain and gait problem.  Skin: Negative for rash.  Neurological: Negative for light-headedness and headaches.  Psychiatric/Behavioral: Negative for agitation and behavioral problems.  All other systems reviewed and are negative.   Per HPI unless specifically indicated above   Allergies as of 09/03/2020      Reactions   Codeine Itching   Dilaudid [hydromorphone] Other (See Comments)   Mouth blisters   Other Itching   Most pain meds cause itching.  When has to take pain medication, she has been instructed to take Benadryl   Doxycycline Rash      Medication List       Accurate as of September 03, 2020  2:26 PM. If you have any questions, ask your nurse or doctor.        acetaminophen 650 MG CR tablet Commonly known as: TYLENOL Take 650 mg by mouth in the morning.   ALLERGY MED PO Take by mouth daily.   aspirin 325 MG tablet Take 325 mg by mouth every evening.   Blood Glucose Monitor System w/Device Kit Use glucose meter to check blood sugar up to 3 times daily as instructed. One Touch Verio preferred by insurance. 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   ELDERBERRY PO Take by mouth.   Fish Oil Triple Strength 1400 MG Caps Take 1,000 mg  by mouth 4 (four) times daily.   furosemide 40 MG tablet Commonly known as: LASIX Take 1 tablet (40 mg total) by mouth daily.   glucose blood test strip One Touch Verio- Use with glucose meter to check blood sugar up to 3 times daily as instructed. Dx: E11.69   Lancets Misc. Kit Use patient & insurance preferred lancet device to test blood glucose up to 3 times daily for Dx: E11.69   levothyroxine 150 MCG tablet Commonly known as: Euthyrox Take 1 tablet (150 mcg total) by mouth daily.    losartan 25 MG tablet Commonly known as: COZAAR Take 1 tablet (25 mg total) by mouth daily.   omeprazole 20 MG capsule Commonly known as: PRILOSEC Take 1 capsule (20 mg total) by mouth 2 (two) times daily before a meal.   Vitamin D3 50 MCG (2000 UT) capsule Take 2,000 Units by mouth 2 (two) times daily.        Objective:   BP (!) 146/83   Pulse 63   Ht '5\' 4"'  (1.626 m)   Wt 248 lb (112.5 kg)   SpO2 95%   BMI 42.57 kg/m   Wt Readings from Last 3 Encounters:  09/03/20 248 lb (112.5 kg)  06/05/20 249 lb (112.9 kg)  03/24/20 238 lb (108 kg)    Physical Exam Vitals and nursing note reviewed.  Constitutional:      General: She is not in acute distress.    Appearance: She is well-developed. She is not diaphoretic.  Eyes:     Conjunctiva/sclera: Conjunctivae normal.  Cardiovascular:     Rate and Rhythm: Normal rate and regular rhythm.     Heart sounds: Normal heart sounds. No murmur heard.   Pulmonary:     Effort: Pulmonary effort is normal. No respiratory distress.     Breath sounds: Normal breath sounds. No wheezing.  Musculoskeletal:        General: No tenderness. Normal range of motion.  Skin:    General: Skin is warm and dry.     Findings: No rash.  Neurological:     Mental Status: She is alert and oriented to person, place, and time.     Coordination: Coordination normal.  Psychiatric:        Behavior: Behavior normal.      Assessment & Plan:   Problem List Items Addressed This Visit      Cardiovascular and Mediastinum   Hypertension associated with diabetes ([place])     Endocrine   Hyperlipidemia associated with type 2 diabetes mellitus ([place])   Relevant Orders   CMP14+EGFR   CBC with Differential/Platelet   Lipid panel   Bayer DCA Hb A1c Waived   Hypothyroid   Relevant Orders   TSH   Type 2 diabetes mellitus with other specified complication ([place]) - Primary   Relevant Orders   CMP14+EGFR   CBC with Differential/Platelet   Lipid panel    Bayer DCA Hb A1c Waived      A1c 7.0, continue diet control, increasing, continues increase we may need to start medication for diabetes.  Patient has been intolerant to statins before and that is why she is not on 1. Follow up plan: Return in about 3 months (around 12/03/2020), or if symptoms worsen or fail to improve, for Diabetes and hypertension and cholesterol and thyroid.  Counseling provided for all of the vaccine components Orders Placed This Encounter  Procedures  . CMP14+EGFR  . CBC with Differential/Platelet  . Lipid panel  . [employer]  DCA Hb A1c Waived  . TSH    Caryl Pina, MD [facility] 09/03/2020, 2:26 PM

## 2020-09-04 LAB — CBC WITH DIFFERENTIAL/PLATELET
Basophils Absolute: 0.1 10*3/uL (ref 0.0–0.2)
Basos: 1 %
EOS (ABSOLUTE): 0.2 10*3/uL (ref 0.0–0.4)
Eos: 4 %
Hematocrit: 38.8 % (ref 34.0–46.6)
Hemoglobin: 13.2 g/dL (ref 11.1–15.9)
Immature Grans (Abs): 0 10*3/uL (ref 0.0–0.1)
Immature Granulocytes: 0 %
Lymphocytes Absolute: 2.4 10*3/uL (ref 0.7–3.1)
Lymphs: 39 %
MCH: 29.9 pg (ref 26.6–33.0)
MCHC: 34 g/dL (ref 31.5–35.7)
MCV: 88 fL (ref 79–97)
Monocytes Absolute: 0.8 10*3/uL (ref 0.1–0.9)
Monocytes: 13 %
Neutrophils Absolute: 2.7 10*3/uL (ref 1.4–7.0)
Neutrophils: 43 %
Platelets: 250 10*3/uL (ref 150–450)
RBC: 4.42 x10E6/uL (ref 3.77–5.28)
RDW: 12.8 % (ref 11.7–15.4)
WBC: 6.2 10*3/uL (ref 3.4–10.8)

## 2020-09-04 LAB — CMP14+EGFR
ALT: 26 IU/L (ref 0–32)
AST: 23 IU/L (ref 0–40)
Albumin/Globulin Ratio: 1.6 (ref 1.2–2.2)
Albumin: 4.3 g/dL (ref 3.7–4.7)
Alkaline Phosphatase: 96 IU/L (ref 44–121)
BUN/Creatinine Ratio: 12 (ref 12–28)
BUN: 11 mg/dL (ref 8–27)
Bilirubin Total: 0.5 mg/dL (ref 0.0–1.2)
CO2: 24 mmol/L (ref 20–29)
Calcium: 9 mg/dL (ref 8.7–10.3)
Chloride: 99 mmol/L (ref 96–106)
Creatinine, Ser: 0.91 mg/dL (ref 0.57–1.00)
Globulin, Total: 2.7 g/dL (ref 1.5–4.5)
Glucose: 124 mg/dL — ABNORMAL HIGH (ref 65–99)
Potassium: 4.1 mmol/L (ref 3.5–5.2)
Sodium: 140 mmol/L (ref 134–144)
Total Protein: 7 g/dL (ref 6.0–8.5)
eGFR: 65 mL/min/{1.73_m2} (ref >59)

## 2020-09-04 LAB — LIPID PANEL
Chol/HDL Ratio: 4.4 ratio (ref 0.0–4.4)
Cholesterol, Total: 141 mg/dL (ref 100–199)
HDL: 32 mg/dL — ABNORMAL LOW
LDL Chol Calc (NIH): 77 mg/dL (ref 0–99)
Triglycerides: 191 mg/dL — ABNORMAL HIGH (ref 0–149)
VLDL Cholesterol Cal: 32 mg/dL (ref 5–40)

## 2020-09-04 LAB — TSH: TSH: 0.904 u[IU]/mL (ref 0.450–4.500)

## 2020-09-23 ENCOUNTER — Encounter: Payer: Self-pay | Admitting: *Deleted

## 2020-10-07 ENCOUNTER — Other Ambulatory Visit: Payer: Self-pay | Admitting: Family Medicine

## 2020-10-07 DIAGNOSIS — Z1231 Encounter for screening mammogram for malignant neoplasm of breast: Secondary | ICD-10-CM

## 2020-10-13 DIAGNOSIS — G4733 Obstructive sleep apnea (adult) (pediatric): Secondary | ICD-10-CM | POA: Diagnosis not present

## 2020-11-14 ENCOUNTER — Other Ambulatory Visit: Payer: Self-pay

## 2020-11-14 ENCOUNTER — Ambulatory Visit (INDEPENDENT_AMBULATORY_CARE_PROVIDER_SITE_OTHER): Payer: Medicare Other | Admitting: Nurse Practitioner

## 2020-11-14 ENCOUNTER — Encounter: Payer: Self-pay | Admitting: Nurse Practitioner

## 2020-11-14 VITALS — BP 130/86 | HR 76 | Temp 97.3°F | Ht 64.0 in | Wt 247.0 lb

## 2020-11-14 DIAGNOSIS — R21 Rash and other nonspecific skin eruption: Secondary | ICD-10-CM | POA: Diagnosis not present

## 2020-11-14 MED ORDER — PREDNISONE 10 MG (21) PO TBPK
ORAL_TABLET | ORAL | 0 refills | Status: DC
Start: 2020-11-14 — End: 2020-11-27

## 2020-11-14 MED ORDER — HYDROCORTISONE 1 % EX LOTN: 1.0000 "application " | TOPICAL_LOTION | Freq: Two times a day (BID) | CUTANEOUS | 0 refills | Status: DC

## 2020-11-14 NOTE — Patient Instructions (Signed)
Rash, Adult  A rash is a change in the color of your skin. A rash can also change the way your skin feels. There are many different conditions and factors that can causea rash. Follow these instructions at home: The goal of treatment is to stop the itching and keep the rash from spreading. Watch for any changes in your symptoms. Let your doctor know about them. Followthese instructions to help with your condition: Medicine Take or apply over-the-counter and prescription medicines only as told by your doctor. These may include medicines: To treat red or swollen skin (corticosteroid creams). To treat itching. To treat an allergy (oral antihistamines). To treat very bad symptoms (oral corticosteroids).  Skin care Put cool cloths (compresses) on the affected areas. Do not scratch or rub your skin. Avoid covering the rash. Make sure that the rash is exposed to air as much as possible. Managing itching and discomfort Avoid hot showers or baths. These can make itching worse. A cold shower may help. Try taking a bath with: Epsom salts. You can get these at your local pharmacy or grocery store. Follow the instructions on the package. Baking soda. Pour a small amount into the bath as told by your doctor. Colloidal oatmeal. You can get this at your local pharmacy or grocery store. Follow the instructions on the package. Try putting baking soda paste onto your skin. Stir water into baking soda until it gets like a paste. Try putting on a lotion that relieves itchiness (calamine lotion). Keep cool and out of the sun. Sweating and being hot can make itching worse. General instructions  Rest as needed. Drink enough fluid to keep your pee (urine) pale yellow. Wear loose-fitting clothing. Avoid scented soaps, detergents, and perfumes. Use gentle soaps, detergents, perfumes, and other cosmetic products. Avoid anything that causes your rash. Keep a journal to help track what causes your rash. Write  down: What you eat. What cosmetic products you use. What you drink. What you wear. This includes jewelry. Keep all follow-up visits as told by your doctor. This is important.  Contact a doctor if: You sweat at night. You lose weight. You pee (urinate) more than normal. You pee less than normal, or you notice that your pee is a darker color than normal. You feel weak. You throw up (vomit). Your skin or the whites of your eyes look yellow (jaundice). Your skin: Tingles. Is numb. Your rash: Does not go away after a few days. Gets worse. You are: More thirsty than normal. More tired than normal. You have: New symptoms. Pain in your belly (abdomen). A fever. Watery poop (diarrhea). Get help right away if: You have a fever and your symptoms suddenly get worse. You start to feel mixed up (confused). You have a very bad headache or a stiff neck. You have very bad joint pains or stiffness. You have jerky movements that you cannot control (seizure). Your rash covers all or most of your body. The rash may or may not be painful. You have blisters that: Are on top of the rash. Grow larger. Grow together. Are painful. Are inside your nose or mouth. You have a rash that: Looks like purple pinprick-sized spots all over your body. Has a "bull's eye" or looks like a target. Is red and painful, causes your skin to peel, and is not from being in the sun too long. Summary A rash is a change in the color of your skin. A rash can also change the way your skin feels.   The goal of treatment is to stop the itching and keep the rash from spreading. Take or apply over-the-counter and prescription medicines only as told by your doctor. Contact a doctor if you have new symptoms or symptoms that get worse. Keep all follow-up visits as told by your doctor. This is important. This information is not intended to replace advice given to you by your health care provider. Make sure you discuss any  questions you have with your healthcare provider. Document Revised: 08/18/2018 Document Reviewed: 11/28/2017 Elsevier Patient Education  2022 Elsevier Inc.  

## 2020-11-14 NOTE — Assessment & Plan Note (Signed)
Current symptoms of rash in the last 3 days.  Cannot exactly tell what caused the rash.  On assessment rash has appearance contact dermatitis.  On currently  Started patient on prednisone taper, hydrocortisone cream, cool compress, keep skin moisturized.  Follow-up with worsening or unresolved symptoms.  Rx sent to pharmacy.

## 2020-11-14 NOTE — Progress Notes (Signed)
Acute Office Visit  Subjective:    Patient ID: Karen Potter, female    DOB: 02-27-1941, 80 y.o.   MRN: [id number]  Chief Complaint  Patient presents with   Insect Bite   Rash    Rash This is a new problem. Episode onset: The past 3 days. The problem has been gradually worsening since onset. The affected locations include the abdomen. The rash is characterized by dryness, itchiness and redness. Pertinent negatives include no congestion, cough, fatigue or fever. Past treatments include nothing.    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   age 41--  pt states was quarantined   no treatment ;  per pt no symptoms or issues since   History of kidney stones    History of TIA (transient ischemic attack)    03-23-2014  w/ episode temporay amnesia   Hyperlipidemia    Hypertension    Hypothyroidism    Iron deficiency anemia    LAFB (left anterior fascicular block)    OA (osteoarthritis)    OSA on CPAP    severe per study 2003   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   Post-polio limb muscle weakness    left leg  w/ 3 repair surgery's   RBBB (right bundle branch block)    Seasonal allergies    Type 2 diabetes mellitus ([place])    Vitamin D deficiency     Past Surgical History:  Procedure Laterality Date   CARPAL TUNNEL RELEASE Right 05-10-2007   CHOLECYSTECTOMY OPEN  1985   and Appendectomy   CYSTO/  RIGHT URETEROSCOPIC STONE EXTRACTON/  STENT PLACEMENT  06/ 2016   in [place]   CYSTOSCOPY/RETROGRADE/URETEROSCOPY/STONE EXTRACTION WITH BASKET Right 11/17/2015    Procedure: CYSTOSCOPY/RETROGRADE PYELOGRAM RIGHT/DIAGNOSTIC RIGHT URETEROSCOPY/LASER RENAL CALCIFICATION/RIGHT STENT PLACEMENT;  Surgeon: Cleon Gustin, MD;  Location: [facility];  Service: Urology;  Laterality: Right;   ESOPHAGOGASTRODUODENOSCOPY N/A 08/29/2015   Procedure: ESOPHAGOGASTRODUODENOSCOPY (EGD);  Surgeon: Danie Binder, MD;  [facility];  Service: Endoscopy;  Laterality: N/A;   HOLMIUM LASER APPLICATION Right 0/38/8828   Procedure: HOLMIUM LASER APPLICATION;  Surgeon: Cleon Gustin, MD;  Location: [facility];  Service: Urology;  Laterality: Right;   KNEE ARTHROSCOPY Right [place];  1985;  1995   polio   LUMBAR SPINE SURGERY  09-01-2010   L4 -L5 fusion,  L3 laminectomy,  L5 - S1 foraminotomy   OVARIAN CYST SURGERY Right 1966   PARATHYROIDECTOMY N/A 05/06/2015   Procedure: PARATHYROIDECTOMY;  Surgeon: Armandina Gemma, MD;  Location: [facility];  Service: General;  Laterality: N/A;   left superior   TONSILLECTOMY  as child   TOTAL ABDOMINAL HYSTERECTOMY W/ BILATERAL SALPINGOOPHORECTOMY  1984   TOTAL KNEE ARTHROPLASTY Right 04-08-2008   TRANSTHORACIC ECHOCARDIOGRAM  04-07-2012   grade 1 diastolic function,  ef 00-34%/  mild AV calcification without stenosis/  trivial MR  Family History  Problem Relation Age of Onset   Heart failure Mother        No details.  No MI   Cancer Father        Lung   Cancer Brother        Lung   Stroke Maternal Grandfather    Cancer Paternal Grandmother        rectal   Cancer Paternal Grandfather        gastric    Social History   Socioeconomic History   Marital status: Married    Spouse name: Mickey   Number of children: 4   Years of education: 13-some college   Highest education level: High school graduate  Occupational History   Occupation: Retired  Tobacco Use   Smoking status: Never   Smokeless tobacco: Never  Scientific laboratory technician Use: Never used   Substance and Sexual Activity   Alcohol use: No   Drug use: No   Sexual activity: Not Currently    Birth control/protection: Surgical  Other Topics Concern   Not on file  Social History Narrative   Lives with husband.  Four children.     Social Determinants of Health   Financial Resource Strain: Not on file  Food Insecurity: Not on file  Transportation Needs: Not on file  Physical Activity: Not on file  Stress: Not on file  Social Connections: Not on file  Intimate Partner Violence: Not on file    Outpatient Medications Prior to Visit  Medication Sig Dispense Refill   acetaminophen (TYLENOL) 650 MG CR tablet Take 650 mg by mouth in the morning.     aspirin 325 MG tablet Take 325 mg by mouth every evening.      Blood Glucose Monitoring Suppl (BLOOD GLUCOSE MONITOR SYSTEM) w/Device KIT Use glucose meter to check blood sugar up to 3 times daily as instructed. One Touch Verio preferred by insurance. Dx: EE11.69 1 each 0   carvedilol (COREG) 6.25 MG tablet Take 1 tablet (6.25 mg total) by mouth 2 (two) times daily with a meal. 180 tablet 3   Cholecalciferol (VITAMIN D3) 2000 UNITS capsule Take 2,000 Units by mouth 2 (two) times daily.      diphenhydrAMINE HCl (ALLERGY MED PO) Take by mouth daily.     ELDERBERRY PO Take by mouth.     furosemide (LASIX) 40 MG tablet Take 1 tablet (40 mg total) by mouth daily. 90 tablet 3   glucose blood test strip One Touch Verio- Use with glucose meter to check blood sugar up to 3 times daily as instructed. Dx: E11.69 100 each 12   Lancets Misc. KIT Use patient & insurance preferred lancet device to test blood glucose up to 3 times daily for Dx: E11.69 100 each 12   levothyroxine (EUTHYROX) 150 MCG tablet Take 1 tablet (150 mcg total) by mouth daily. 90 tablet 3   losartan (COZAAR) 25 MG tablet Take 1 tablet (25 mg total) by mouth daily. 90 tablet 3   Omega-3 Fatty Acids (FISH OIL TRIPLE STRENGTH) 1400 MG CAPS Take 1,000 mg by mouth 4 (four) times  daily.      omeprazole (PRILOSEC) 20 MG capsule Take 1 capsule (20 mg total) by mouth 2 (two) times daily before a meal. 180 capsule 3   No facility-administered medications prior to visit.    Allergies  Allergen Reactions   Codeine Itching   Dilaudid [Hydromorphone] Other (See Comments)    Mouth blisters   Other  Itching    Most pain meds cause itching.  When has to take pain medication, she has been instructed to take Benadryl   Doxycycline Rash    Review of Systems  Constitutional: Negative.  Negative for fatigue and fever.  HENT:  Negative for congestion.   Respiratory: Negative.  Negative for cough.   Genitourinary: Negative.   Skin:  Positive for rash.  All other systems reviewed and are negative.     Objective:    Physical Exam Vitals and nursing note reviewed.  Constitutional:      Appearance: Normal appearance.  HENT:     Head: Normocephalic.  Eyes:     Conjunctiva/sclera: Conjunctivae normal.  Cardiovascular:     Rate and Rhythm: Normal rate and regular rhythm.  Pulmonary:     Effort: Pulmonary effort is normal.     Breath sounds: Normal breath sounds.  Abdominal:     General: Bowel sounds are normal.  Skin:    Findings: Erythema and rash present.  Neurological:     Mental Status: She is alert and oriented to person, place, and time.    BP 130/86   Pulse 76   Temp (!) 97.3 F (36.3 C) (Temporal)   Ht '5\' 4"'  (1.626 m)   Wt 247 lb (112 kg)   SpO2 97%   BMI 42.40 kg/m  Wt Readings from Last 3 Encounters:  11/14/20 247 lb (112 kg)  09/03/20 248 lb (112.5 kg)  06/05/20 249 lb (112.9 kg)    Health Maintenance Due  Topic Date Due   Zoster Vaccines- Shingrix (1 of 2) Never done    There are no preventive care reminders to display for this patient.   Lab Results  Component Value Date   TSH 0.904 09/03/2020   Lab Results  Component Value Date   WBC 6.2 09/03/2020   HGB 13.2 09/03/2020   HCT 38.8 09/03/2020   MCV 88 09/03/2020   PLT 250  09/03/2020   Lab Results  Component Value Date   NA 140 09/03/2020   K 4.1 09/03/2020   CO2 24 09/03/2020   GLUCOSE 124 (H) 09/03/2020   BUN 11 09/03/2020   CREATININE 0.91 09/03/2020   BILITOT 0.5 09/03/2020   ALKPHOS 96 09/03/2020   AST 23 09/03/2020   ALT 26 09/03/2020   PROT 7.0 09/03/2020   ALBUMIN 4.3 09/03/2020   CALCIUM 9.0 09/03/2020   ANIONGAP 8 08/16/2019   EGFR 65 09/03/2020   Lab Results  Component Value Date   CHOL 141 09/03/2020   Lab Results  Component Value Date   HDL 32 (L) 09/03/2020   Lab Results  Component Value Date   LDLCALC 77 09/03/2020   Lab Results  Component Value Date   TRIG 191 (H) 09/03/2020   Lab Results  Component Value Date   CHOLHDL 4.4 09/03/2020   Lab Results  Component Value Date   HGBA1C 7.0 (H) 09/03/2020       Assessment & Plan:   Problem List Items Addressed This Visit       Musculoskeletal and Integument   Rash - Primary    Current symptoms of rash in the last 3 days.  Cannot exactly tell what caused the rash.  On assessment rash has appearance contact dermatitis.  On currently  Started patient on prednisone taper, hydrocortisone cream, cool compress, keep skin moisturized.  Follow-up with worsening or unresolved symptoms.  Rx sent to pharmacy.       Relevant Medications   predniSONE (  STERAPRED UNI-PAK 21 TAB) 10 MG (21) TBPK tablet   hydrocortisone 1 % lotion     Meds ordered this encounter  Medications   predniSONE (STERAPRED UNI-PAK 21 TAB) 10 MG (21) TBPK tablet    Sig: 6 tablet day 1, 5 tablet day 2, 4 tablet day 3, 3 tablet day 4, 2 tablet day 5, 1 tablet day 6    Dispense:  21 tablet    Refill:  0    Order Specific Question:   Supervising Provider    Answer:   Janora Norlander [[id number]]   hydrocortisone 1 % lotion    Sig: Apply 1 application topically 2 (two) times daily.    Dispense:  118 mL    Refill:  0    Order Specific Question:   Supervising Provider    Answer:   Janora Norlander [[id number]]     Ivy Lynn, NP

## 2020-11-23 ENCOUNTER — Other Ambulatory Visit: Payer: Self-pay

## 2020-11-23 ENCOUNTER — Emergency Department (HOSPITAL_COMMUNITY): Payer: Medicare Other

## 2020-11-23 ENCOUNTER — Emergency Department (HOSPITAL_COMMUNITY)
Admission: EM | Admit: 2020-11-23 | Discharge: 2020-11-23 | Disposition: A | Discharge status: Home / Self Care | Payer: Medicare Other | Attending: Emergency Medicine | Admitting: Emergency Medicine

## 2020-11-23 DIAGNOSIS — I10 Essential (primary) hypertension: Secondary | ICD-10-CM | POA: Diagnosis not present

## 2020-11-23 DIAGNOSIS — M1712 Unilateral primary osteoarthritis, left knee: Secondary | ICD-10-CM | POA: Diagnosis not present

## 2020-11-23 DIAGNOSIS — E039 Hypothyroidism, unspecified: Secondary | ICD-10-CM | POA: Insufficient documentation

## 2020-11-23 DIAGNOSIS — M25562 Pain in left knee: Secondary | ICD-10-CM | POA: Diagnosis not present

## 2020-11-23 DIAGNOSIS — Z79899 Other long term (current) drug therapy: Secondary | ICD-10-CM | POA: Insufficient documentation

## 2020-11-23 DIAGNOSIS — M79605 Pain in left leg: Secondary | ICD-10-CM

## 2020-11-23 DIAGNOSIS — Z96651 Presence of right artificial knee joint: Secondary | ICD-10-CM | POA: Insufficient documentation

## 2020-11-23 DIAGNOSIS — M25462 Effusion, left knee: Secondary | ICD-10-CM | POA: Diagnosis not present

## 2020-11-23 DIAGNOSIS — Z7982 Long term (current) use of aspirin: Secondary | ICD-10-CM | POA: Insufficient documentation

## 2020-11-23 DIAGNOSIS — E119 Type 2 diabetes mellitus without complications: Secondary | ICD-10-CM | POA: Diagnosis not present

## 2020-11-23 MED ORDER — ACETAMINOPHEN 325 MG PO TABS
650.0000 mg | ORAL_TABLET | Freq: Once | ORAL | Status: AC
  Administered 2020-11-23: 650 mg via ORAL
  Filled 2020-11-23: qty 2

## 2020-11-23 MED ORDER — NAPROXEN 500 MG PO TABS: 500.0000 mg | ORAL_TABLET | Freq: Two times a day (BID) | ORAL | 0 refills | Status: DC

## 2020-11-23 NOTE — ED Triage Notes (Signed)
Pt c/o increasing knee pain and swelling starting this AM.

## 2020-11-23 NOTE — ED Provider Notes (Signed)
[facility] EMERGENCY DEPARTMENT Provider Note  CSN: [id number] Arrival date & time: 11/23/20 1542    History Chief Complaint  Patient presents with   Knee Pain    Karen Potter is a 80 y.o. female with history of prior R knee arthoplasty reports onset of L knee pain yesterday morning. Medial left knee, some associated swelling, pain is worse with bearing weight. No falls or injuries. No swelling above or below the knee. Daughter was concerned about blood clot and convinced her to come to the ED for evaluation. She denies any CP or SOB. She has been taking Aleve with minimal improvement.    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   age 53--  pt states was quarantined   no treatment ;  per pt no symptoms or issues since   History of kidney stones    History of TIA (transient ischemic attack)    03-23-2014  w/ episode temporay amnesia   Hyperlipidemia    Hypertension    Hypothyroidism    Iron deficiency anemia    LAFB (left anterior fascicular block)    OA (osteoarthritis)    OSA on CPAP    severe per study 2003   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   Post-polio limb muscle weakness    left leg  w/ 3 repair surgery's   RBBB (right bundle branch block)    Seasonal allergies    Type 2 diabetes mellitus ([place])    Vitamin D deficiency     Past Surgical History:  Procedure Laterality Date   CARPAL TUNNEL RELEASE Right 05-10-2007   CHOLECYSTECTOMY OPEN  1985   and Appendectomy   CYSTO/  RIGHT URETEROSCOPIC STONE EXTRACTON/  STENT PLACEMENT  06/  2016   in [place]   CYSTOSCOPY/RETROGRADE/URETEROSCOPY/STONE EXTRACTION WITH BASKET Right 11/17/2015   Procedure: CYSTOSCOPY/RETROGRADE PYELOGRAM RIGHT/DIAGNOSTIC RIGHT URETEROSCOPY/LASER RENAL CALCIFICATION/RIGHT STENT PLACEMENT;  Surgeon: Cleon Gustin, MD;  Location: [facility];  Service: Urology;  Laterality: Right;   ESOPHAGOGASTRODUODENOSCOPY N/A 08/29/2015   Procedure: ESOPHAGOGASTRODUODENOSCOPY (EGD);  Surgeon: Danie Binder, MD;  [facility];  Service: Endoscopy;  Laterality: N/A;   HOLMIUM LASER APPLICATION Right 05/11/5850   Procedure: HOLMIUM LASER APPLICATION;  Surgeon: Cleon Gustin, MD;  Location: [facility];  Service: Urology;  Laterality: Right;   KNEE ARTHROSCOPY Right [place];  1985;  1995   polio   LUMBAR SPINE SURGERY  09-01-2010   L4 -L5 fusion,  L3 laminectomy,  L5 - S1 foraminotomy   OVARIAN CYST SURGERY Right 1966   PARATHYROIDECTOMY N/A 05/06/2015   Procedure: PARATHYROIDECTOMY;  Surgeon: Armandina Gemma, MD;  Location: [facility];  Service: General;  Laterality: N/A;   left superior   TONSILLECTOMY  as child   TOTAL ABDOMINAL HYSTERECTOMY W/ BILATERAL SALPINGOOPHORECTOMY  1984   TOTAL KNEE ARTHROPLASTY Right 04-08-2008   TRANSTHORACIC ECHOCARDIOGRAM  04-07-2012  grade 1 diastolic function,  ef 33-61%/  mild AV calcification without stenosis/  trivial MR    Family History  Problem Relation Age of Onset   Heart failure Mother        No details.  No MI   Cancer Father        Lung   Cancer Brother        Lung   Stroke Maternal Grandfather    Cancer Paternal Grandmother        rectal   Cancer Paternal Grandfather        gastric    Social History   Tobacco Use   Smoking status: Never   Smokeless tobacco: Never  Vaping Use   Vaping Use: Never used  Substance Use Topics   Alcohol use: No   Drug use: No     Home Medications Prior to Admission medications   Medication Sig  Start Date End Date Taking? Authorizing Provider  naproxen (NAPROSYN) 500 MG tablet Take 1 tablet (500 mg total) by mouth 2 (two) times daily. 11/23/20  Yes Truddie Hidden, MD  acetaminophen (TYLENOL) 650 MG CR tablet Take 650 mg by mouth in the morning.    [provider]  aspirin 325 MG tablet Take 325 mg by mouth every evening.     [provider]  Blood Glucose Monitoring Suppl (BLOOD GLUCOSE MONITOR SYSTEM) w/Device KIT Use glucose meter to check blood sugar up to 3 times daily as instructed. One Touch Verio preferred by insurance. Dx: EE11.69 05/12/18   Dettinger, Fransisca Kaufmann, MD  carvedilol (COREG) 6.25 MG tablet Take 1 tablet (6.25 mg total) by mouth 2 (two) times daily with a meal. 03/14/20   Dettinger, Fransisca Kaufmann, MD  Cholecalciferol (VITAMIN D3) 2000 UNITS capsule Take 2,000 Units by mouth 2 (two) times daily.     [provider]  diphenhydrAMINE HCl (ALLERGY MED PO) Take by mouth daily.    [provider]  ELDERBERRY PO Take by mouth.    [provider]  furosemide (LASIX) 40 MG tablet Take 1 tablet (40 mg total) by mouth daily. 03/14/20   Dettinger, Fransisca Kaufmann, MD  glucose blood test strip One Touch Verio- Use with glucose meter to check blood sugar up to 3 times daily as instructed. Dx: E11.69 05/12/18   Dettinger, Fransisca Kaufmann, MD  hydrocortisone 1 % lotion Apply 1 application topically 2 (two) times daily. 11/14/20   Ivy Lynn, NP  Lancets Misc. 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 03/14/20   Dettinger, Fransisca Kaufmann, MD  losartan (COZAAR) 25 MG tablet Take 1 tablet (25 mg total) by mouth daily. 03/14/20   Dettinger, Fransisca Kaufmann, MD  Omega-3 Fatty Acids (FISH OIL TRIPLE STRENGTH) 1400 MG CAPS Take 1,000 mg by mouth 4 (four) times daily.     [provider]   omeprazole (PRILOSEC) 20 MG capsule Take 1 capsule (20 mg total) by mouth 2 (two) times daily before a meal. 03/14/20   Dettinger, Fransisca Kaufmann, MD  predniSONE (STERAPRED UNI-PAK 21 TAB) 10 MG (21) TBPK tablet 6 tablet day 1, 5 tablet day 2, 4 tablet day 3, 3 tablet day 4, 2 tablet day 5, 1 tablet day 6 11/14/20   Ivy Lynn, NP     Allergies  Codeine, Dilaudid [hydromorphone], Other, and Doxycycline   Review of Systems   Review of Systems A comprehensive review of systems was completed and negative except as noted in HPI.    Physical Exam BP 100/65   Pulse 83   Temp 98.6 F (37 C)   Resp 19   Ht _0  (1.626 m)   Wt 108.9 kg   SpO2 96%   BMI 41.20 kg/m   Physical Exam Vitals and nursing note reviewed.  Constitutional:      Appearance: Normal appearance.  HENT:     Head: Normocephalic and atraumatic.     Nose: Nose normal.     Mouth/Throat:     Mouth: Mucous membranes are moist.  Eyes:     Extraocular Movements: Extraocular movements intact.     Conjunctiva/sclera: Conjunctivae normal.  Cardiovascular:     Rate and Rhythm: Normal rate.  Pulmonary:     Effort: Pulmonary effort is normal.     Breath sounds: Normal breath sounds.  Abdominal:     General: Abdomen is flat.     Palpations: Abdomen is soft.     Tenderness: There is no abdominal tenderness.  Musculoskeletal:        General: Tenderness (medial L knee) present. No swelling.     Cervical back: Neck supple.     Comments: No erythema, warmth or effusion of L knee. Pain with ROM.   Skin:    General: Skin is warm and dry.  Neurological:     General: No focal deficit present.     Mental Status: She is alert.  Psychiatric:        Mood and Affect: Mood normal.     ED Results / Procedures / Treatments   Labs (all labs ordered are listed, but only abnormal results are displayed) Labs Reviewed - No data to display  EKG None  Radiology DG Knee Complete 4 Views Left  Result Date:  11/23/2020 CLINICAL DATA:  Knee pain EXAM: LEFT KNEE - COMPLETE 4+ VIEW COMPARISON:  None. FINDINGS: No fracture or dislocation of the knee. Large suprapatellar joint effusion. Mild degenerative changes of the patella. IMPRESSION: Joint effusion.  No acute osseous abnormality. Electronically Signed   By: Suzy Bouchard M.D.   On: 11/23/2020 16:30    Procedures Procedures  Medications Ordered in the ED Medications  acetaminophen (TYLENOL) tablet 650 mg (650 mg Oral Given 11/23/20 1628)     MDM Rules/Calculators/A&P MDM Patient with knee pain, likely MSK. Daughter is concerned about DVT although patient herself is less concerned. Will check xray. Doppler is not available today, will have her come back tomorrow for DVT study.   ED Course  I have reviewed the triage vital signs and the nursing notes.  Pertinent labs & imaging results that were available during my care of the patient were reviewed by me and considered in my medical decision making (see chart for details).  Clinical Course as of 11/23/20 1831  Nancy Fetter Nov 23, 2020  1635 Xray with no acute fracture. Joint effusion is present. Will order knee immobilizer for comfort. Return tomorrow for doppler. Ortho follow up. Aleve for pain.  [CS]    Clinical Course User Index [CS] Truddie Hidden, MD    Final Clinical Impression(s) / ED Diagnoses Final diagnoses:  Acute pain of left knee    Rx / DC Orders ED Discharge Orders          Ordered    naproxen (NAPROSYN) 500 MG tablet  2  times daily        11/23/20 1647    US Venous Img Lower Unilateral Left        11/23/20 1649             Truddie Hidden, MD 11/23/20 [id number]

## 2020-11-24 ENCOUNTER — Ambulatory Visit (HOSPITAL_COMMUNITY)
Admission: RE | Admit: 2020-11-24 | Discharge: 2020-11-24 | Disposition: A | Discharge status: Home / Self Care | Payer: Medicare Other | Source: Ambulatory Visit | Attending: Emergency Medicine | Admitting: Emergency Medicine

## 2020-11-24 ENCOUNTER — Telehealth: Payer: Self-pay | Admitting: Family Medicine

## 2020-11-24 DIAGNOSIS — M25562 Pain in left knee: Secondary | ICD-10-CM | POA: Diagnosis not present

## 2020-11-24 DIAGNOSIS — M79605 Pain in left leg: Secondary | ICD-10-CM | POA: Diagnosis not present

## 2020-11-25 NOTE — Telephone Encounter (Signed)
Pt was mentioning the u/s for DVT. She has been informed and understood the results.

## 2020-11-25 NOTE — Telephone Encounter (Signed)
I do see a DVT ultrasound and it looks like there was no sign of DVT, was normal, is this what she was talking about?

## 2020-11-27 ENCOUNTER — Other Ambulatory Visit: Payer: Self-pay

## 2020-11-27 ENCOUNTER — Encounter: Payer: Self-pay | Admitting: Family Medicine

## 2020-11-27 ENCOUNTER — Ambulatory Visit (INDEPENDENT_AMBULATORY_CARE_PROVIDER_SITE_OTHER): Payer: Medicare Other | Admitting: Family Medicine

## 2020-11-27 VITALS — BP 136/66 | HR 61 | Ht 64.0 in | Wt 244.0 lb

## 2020-11-27 DIAGNOSIS — E039 Hypothyroidism, unspecified: Secondary | ICD-10-CM | POA: Diagnosis not present

## 2020-11-27 DIAGNOSIS — E1169 Type 2 diabetes mellitus with other specified complication: Secondary | ICD-10-CM

## 2020-11-27 DIAGNOSIS — E1159 Type 2 diabetes mellitus with other circulatory complications: Secondary | ICD-10-CM

## 2020-11-27 DIAGNOSIS — E785 Hyperlipidemia, unspecified: Secondary | ICD-10-CM | POA: Diagnosis not present

## 2020-11-27 DIAGNOSIS — I152 Hypertension secondary to endocrine disorders: Secondary | ICD-10-CM | POA: Diagnosis not present

## 2020-11-27 DIAGNOSIS — L03116 Cellulitis of left lower limb: Secondary | ICD-10-CM | POA: Diagnosis not present

## 2020-11-27 LAB — BAYER DCA HB A1C WAIVED: HB A1C (BAYER DCA - WAIVED): 7.2 % — ABNORMAL HIGH (ref <7.0)

## 2020-11-27 MED ORDER — CEPHALEXIN 500 MG PO CAPS: 500.0000 mg | ORAL_CAPSULE | Freq: Four times a day (QID) | ORAL | 0 refills | Status: DC

## 2020-11-27 NOTE — Progress Notes (Signed)
BP 136/66   Pulse 61   Ht '5\' 4"'  (1.626 m)   Wt 244 lb (110.7 kg)   SpO2 96%   BMI 41.88 kg/m    Subjective:   Patient ID: Karen Potter, female    DOB: 1941-02-03, 80 y.o.   MRN: [id number]  HPI: Karen Potter is a 80 y.o. female presenting on 11/27/2020 for Medical Management of Chronic Issues, Diabetes, and Leg Pain (Left lower leg. Started in June after injury)   HPI Type 2 diabetes mellitus Patient comes in today for recheck of his diabetes. Patient has been currently taking no medication, has been doing diet control, blood sugar is up slightly at 7.2. Patient is currently on an ACE inhibitor/ARB. Patient has not seen an ophthalmologist this year. Patient denies any issues with their feet. The symptom started onset as an adult hypothyroidism and hypertension and hyperlipidemia ARE RELATED TO DM   Hypothyroidism recheck Patient is coming in for thyroid recheck today as well. They deny any issues with hair changes or heat or cold problems or diarrhea or constipation. They deny any chest pain or palpitations. They are currently on levothyroxine 150 micrograms   Hyperlipidemia Patient is coming in for recheck of his hyperlipidemia. The patient is currently taking fish oil. They deny any issues with myalgias or history of liver damage from it. They deny any focal numbness or weakness or chest pain.   Hypertension Patient is currently on carvedilol and furosemide and losartan, and their blood pressure today is 136/66. Patient denies any lightheadedness or dizziness. Patient denies headaches, blurred vision, chest pains, shortness of breath, or weakness. Denies any side effects from medication and is content with current medication.   Spot on the back of her left leg where previous incision had opened up and developed a sore, started a few months ago and she has been using creams and lotions to doctor on it but it has been very slow to improve but it is improving.  She denies any  fevers or chills.  It does feel very sore and painful in that area.  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  Relevant past medical, surgical, family and social history reviewed and updated as indicated. Interim medical history since our last visit reviewed. Allergies and medications reviewed and updated.  Review of Systems  Constitutional:  Negative for chills and fever.  Eyes:  Negative for visual disturbance.  Respiratory:  Negative for chest tightness and shortness of breath.   Cardiovascular:  Negative for chest pain and leg swelling.  Genitourinary:  Negative for difficulty urinating and dysuria.  Musculoskeletal:  Negative for back pain and gait problem.  Skin:  Positive for color change and rash.  Neurological:  Negative for light-headedness and headaches.  Psychiatric/Behavioral:  Negative for agitation and behavioral problems.   All other systems reviewed and are negative.  Per HPI unless specifically indicated above   Allergies as of 11/27/2020       Reactions   Codeine Itching   Dilaudid [hydromorphone] Other (See Comments)   Mouth blisters   Other Itching   Most pain meds cause itching.  When has to take pain medication, she has been instructed to take Benadryl   Doxycycline Rash        Medication List        Accurate as of November 27, 2020 10:34 AM. If you have any questions, ask your nurse or  doctor.          STOP taking these medications    predniSONE 10 MG (21) Tbpk tablet Commonly known as: STERAPRED UNI-PAK 21 TAB Stopped by: Fransisca Kaufmann Amer Alcindor, MD       TAKE these medications    acetaminophen 650 MG CR tablet Commonly known as: TYLENOL Take 650 mg by mouth in the morning.   ALLERGY MED PO Take by mouth daily.   aspirin 325 MG tablet Take 325 mg by mouth every evening.   Blood Glucose Monitor System w/Device Kit Use glucose meter to check blood sugar up to 3 times daily as  instructed. One Touch Verio preferred by insurance. 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   cephALEXin 500 MG capsule Commonly known as: KEFLEX Take 1 capsule (500 mg total) by mouth 4 (four) times daily. Started by: Worthy Rancher, MD   ELDERBERRY PO Take by mouth.   Fish Oil Triple Strength 1400 MG Caps Take 1,000 mg by mouth 4 (four) times daily.   furosemide 40 MG tablet Commonly known as: LASIX Take 1 tablet (40 mg total) by mouth daily.   glucose blood test strip One Touch Verio- Use with glucose meter to check blood sugar up to 3 times daily as instructed. Dx: E11.69   hydrocortisone 1 % lotion Apply 1 application topically 2 (two) times daily.   Lancets Misc. Kit Use patient & insurance preferred lancet device to test blood glucose up to 3 times daily for Dx: E11.69   levothyroxine 150 MCG tablet Commonly known as: Euthyrox Take 1 tablet (150 mcg total) by mouth daily.   losartan 25 MG tablet Commonly known as: COZAAR Take 1 tablet (25 mg total) by mouth daily.   naproxen 500 MG tablet Commonly known as: NAPROSYN Take 1 tablet (500 mg total) by mouth 2 (two) times daily.   omeprazole 20 MG capsule Commonly known as: PRILOSEC Take 1 capsule (20 mg total) by mouth 2 (two) times daily before a meal.   Vitamin D3 50 MCG (2000 UT) capsule Take 2,000 Units by mouth 2 (two) times daily.         Objective:   BP 136/66   Pulse 61   Ht '5\' 4"'  (1.626 m)   Wt 244 lb (110.7 kg)   SpO2 96%   BMI 41.88 kg/m   Wt Readings from Last 3 Encounters:  11/27/20 244 lb (110.7 kg)  11/23/20 240 lb (108.9 kg)  11/14/20 247 lb (112 kg)    Physical Exam Vitals and nursing note reviewed.  Constitutional:      General: She is not in acute distress.    Appearance: She is well-developed. She is not diaphoretic.  Eyes:     Conjunctiva/sclera: Conjunctivae normal.   Cardiovascular:     Rate and Rhythm: Normal rate and regular rhythm.     Heart sounds: Normal heart sounds. No murmur heard. Pulmonary:     Effort: Pulmonary effort is normal. No respiratory distress.     Breath sounds: Normal breath sounds. No wheezing.  Skin:    General: Skin is warm and dry.     Findings: Erythema and rash (On the back of her calf near the incision where she has had a previous surgery, small area of induration and erythema, able to express it open and having sanguinous drainage, no purulence.  Very tender) present.  Neurological:     Mental Status: She is alert and oriented to person, place, and time.     Coordination: Coordination normal.  Psychiatric:        Behavior: Behavior normal.      Assessment & Plan:   Problem List Items Addressed This Visit       Cardiovascular and Mediastinum   Hypertension associated with diabetes ([place])     Endocrine   Hyperlipidemia associated with type 2 diabetes mellitus ([place])   Hypothyroid   Type 2 diabetes mellitus with other specified complication ([place]) - Primary   Relevant Orders   Bayer DCA Hb A1c Waived   Other Visit Diagnoses     Cellulitis of left lower extremity       Relevant Medications   cephALEXin (KEFLEX) 500 MG capsule       Continue current medication, will check blood work.  Follow-up in 3 months for diabetes, will focus on diet extensively, if continues to worsen then we may start her on medicine for diabetes in the future.  Gave her an antibiotic to help with the cellulitis on the back of her left leg Follow up plan: Return in about 3 months (around 02/27/2021), or if symptoms worsen or fail to improve, for Diabetes and hypertension and thyroid.  Counseling provided for all of the vaccine components Orders Placed This Encounter  Procedures   Bayer [place] Hb A1c [place] Meghan Tiemann, MD [facility] 11/27/2020, 10:34 AM

## 2020-11-28 ENCOUNTER — Ambulatory Visit: Payer: Medicare Other | Admitting: Family Medicine

## 2020-12-02 ENCOUNTER — Ambulatory Visit
Admission: RE | Admit: 2020-12-02 | Discharge: 2020-12-02 | Disposition: A | Discharge status: Home / Self Care | Payer: Medicare Other | Source: Ambulatory Visit | Attending: Family Medicine | Admitting: Family Medicine

## 2020-12-02 ENCOUNTER — Other Ambulatory Visit: Payer: Self-pay

## 2020-12-02 DIAGNOSIS — Z1231 Encounter for screening mammogram for malignant neoplasm of breast: Secondary | ICD-10-CM | POA: Diagnosis not present

## 2020-12-03 ENCOUNTER — Ambulatory Visit (INDEPENDENT_AMBULATORY_CARE_PROVIDER_SITE_OTHER): Payer: Medicare Other

## 2020-12-03 VITALS — Ht 64.0 in | Wt 244.0 lb

## 2020-12-03 DIAGNOSIS — Z0000 Encounter for general adult medical examination without abnormal findings: Secondary | ICD-10-CM

## 2020-12-03 NOTE — Progress Notes (Signed)
Subjective:   LAVELLE BERLAND is a 80 y.o. female who presents for Medicare Annual (Subsequent) preventive examination.  Virtual Visit via Telephone Note  I connected with  Jari Sportsman on 12/03/20 at  1:15 PM EDT by telephone and verified that I am speaking with the correct person using two identifiers.  Location: Patient: Home Provider: [facility] Persons participating in the virtual visit: patient/Nurse Health Advisor   I discussed the limitations, risks, security and privacy concerns of performing an evaluation and management service by telephone and the availability of in person appointments. The patient expressed understanding and agreed to proceed.  Interactive audio and video telecommunications were attempted between this nurse and patient, however failed, due to patient having technical difficulties OR patient did not have access to video capability.  We continued and completed visit with audio only.  Some vital signs may be absent or patient reported.   Harjot Dibello E Tykisha Areola, LPN   Review of Systems     Cardiac Risk Factors include: advanced age (>53mn, >>33women);diabetes mellitus;obesity (BMI >30kg/m2);sedentary lifestyle;dyslipidemia;hypertension     Objective:    Today's Vitals   12/03/20 1318  Weight: 244 lb (110.7 kg)  Height: '5\' 4"'  (1.626 m)  PainSc: 0-No pain   Body mass index is 41.88 kg/m.  Advanced Directives 12/03/2020 11/23/2020 12/03/2019 08/17/2019 10/12/2018 02/23/2018 08/29/2017  Does Patient Have a Medical Advance Directive? Yes No;Yes Yes No Yes No Yes  Type of [place] Living will Living will - Living will - Living will  Does patient want to make changes to medical advance directive? - - No - Patient declined - - - No - Patient declined  Copy of HTaltyin Chart? No - copy requested - - - - - -  Would patient like information on creating a medical advance directive? - - - No - Patient  declined - No - Patient declined -    Current Medications (verified) Outpatient Encounter Medications as of 12/03/2020  Medication Sig   acetaminophen (TYLENOL) 650 MG CR tablet Take 650 mg by mouth in the morning.   aspirin 325 MG tablet Take 325 mg by mouth every evening.    carvedilol (COREG) 6.25 MG tablet Take 1 tablet (6.25 mg total) by mouth 2 (two) times daily with a meal.   cephALEXin (KEFLEX) 500 MG capsule Take 1 capsule (500 mg total) by mouth 4 (four) times daily.   Cholecalciferol (VITAMIN D3) 2000 UNITS capsule Take 2,000 Units by mouth 2 (two) times daily.    diphenhydrAMINE HCl (ALLERGY MED PO) Take by mouth daily.   ELDERBERRY PO Take by mouth.   furosemide (LASIX) 40 MG tablet Take 1 tablet (40 mg total) by mouth daily.   hydrocortisone 1 % lotion Apply 1 application topically 2 (two) times daily.   levothyroxine (EUTHYROX) 150 MCG tablet Take 1 tablet (150 mcg total) by mouth daily.   losartan (COZAAR) 25 MG tablet Take 1 tablet (25 mg total) by mouth daily.   naproxen (NAPROSYN) 500 MG tablet Take 1 tablet (500 mg total) by mouth 2 (two) times daily.   Omega-3 Fatty Acids (FISH OIL TRIPLE STRENGTH) 1400 MG CAPS Take 1,000 mg by mouth 4 (four) times daily.    omeprazole (PRILOSEC) 20 MG capsule Take 1 capsule (20 mg total) by mouth 2 (two) times daily before a meal.   Blood Glucose Monitoring Suppl (BLOOD GLUCOSE MONITOR SYSTEM) w/Device KIT Use glucose meter to check blood sugar  up to 3 times daily as instructed. One Touch Verio preferred by insurance. Dx: EE11.69 (Patient not taking: Reported on 12/03/2020)   glucose blood test strip One Touch Verio- Use with glucose meter to check blood sugar up to 3 times daily as instructed. Dx: E11.69 (Patient not taking: Reported on 12/03/2020)   Lancets Misc. KIT Use patient & insurance preferred lancet device to test blood glucose up to 3 times daily for Dx: E11.69 (Patient not taking: Reported on 12/03/2020)   No  facility-administered encounter medications on file as of 12/03/2020.    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   age 5--  pt states was quarantined   no treatment ;  per pt no symptoms or issues since   History of kidney stones    History of TIA (transient ischemic attack)    03-23-2014  w/ episode temporay amnesia   Hyperlipidemia    Hypertension    Hypothyroidism    Iron deficiency anemia    LAFB (left anterior fascicular block)    OA (osteoarthritis)    OSA on CPAP    severe per study 2003   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   Post-polio limb muscle weakness    left leg  w/ 3 repair surgery's   RBBB (right bundle branch block)    Seasonal allergies    Type 2 diabetes mellitus ([place])    Vitamin D deficiency    Past Surgical History:  Procedure Laterality Date   CARPAL TUNNEL RELEASE Right 05-10-2007   CHOLECYSTECTOMY OPEN  1985   and Appendectomy   CYSTO/  RIGHT URETEROSCOPIC STONE EXTRACTON/  STENT PLACEMENT  06/ 2016   in [place]   CYSTOSCOPY/RETROGRADE/URETEROSCOPY/STONE EXTRACTION WITH BASKET Right 11/17/2015   Procedure: CYSTOSCOPY/RETROGRADE PYELOGRAM RIGHT/DIAGNOSTIC RIGHT URETEROSCOPY/LASER RENAL CALCIFICATION/RIGHT STENT PLACEMENT;  Surgeon: Cleon Gustin, MD;  [facility];  Service: Urology;  Laterality: Right;   ESOPHAGOGASTRODUODENOSCOPY N/A 08/29/2015   Procedure: ESOPHAGOGASTRODUODENOSCOPY (EGD);  Surgeon: Danie Binder, MD;  [facility];  Service: Endoscopy;  Laterality: N/A;   HOLMIUM LASER APPLICATION Right 0/16/5537   Procedure: HOLMIUM LASER APPLICATION;  Surgeon: Cleon Gustin, MD;  [facility];  Service: Urology;  Laterality: Right;   KNEE ARTHROSCOPY Right [place];  1985;  1995   polio   LUMBAR SPINE SURGERY  09-01-2010   L4 -L5 fusion,  L3 laminectomy,  L5 - S1 foraminotomy   OVARIAN CYST SURGERY Right 1966   PARATHYROIDECTOMY N/A 05/06/2015   Procedure: PARATHYROIDECTOMY;  Surgeon: Armandina Gemma, MD;  Location: [facility];  Service: General;  Laterality: N/A;   left superior   TONSILLECTOMY  as child   TOTAL ABDOMINAL HYSTERECTOMY W/ BILATERAL SALPINGOOPHORECTOMY  1984   TOTAL KNEE ARTHROPLASTY Right 04-08-2008   TRANSTHORACIC ECHOCARDIOGRAM  04-07-2012   grade 1 diastolic  function,  ef 60-65%/  mild AV calcification without stenosis/  trivial MR   Family History  Problem Relation Age of Onset   Heart failure Mother        No details.  No MI   Cancer Father        Lung   Cancer Brother        Lung   Stroke Maternal Grandfather    Cancer Paternal Grandmother        rectal   Cancer Paternal Grandfather        gastric   Social History   Socioeconomic History   Marital status: Married    Spouse name: Mickey   Number of children: 4   Years of education: 13-some college   Highest education level: High school graduate  Occupational History   Occupation: Retired  Tobacco Use   Smoking status: Never   Smokeless tobacco: Never  Scientific laboratory technician Use: Never used  Substance and Sexual Activity   Alcohol use: No   Drug use: No   Sexual activity: Not Currently    Birth control/protection: Surgical  Other Topics Concern   Not on file  Social History Narrative   Lives with husband.  Four children.     Drinks 1-2 cokes per day, but usually drinks water   All children live fairly close. Son is closest, but  works 12-13 hours/day.   Disabled daughter in [place] that she helps care for.   Another daughter lives within 15 minutes and takes her shopping and to out of town doctor visits.   Social Determinants of Health   Financial Resource Strain: Low Risk    Difficulty of Paying Living Expenses: Not hard at all  Food Insecurity: No Food Insecurity   Worried About Charity fundraiser in the Last Year: Never true   [place] in the Last Year: Never true  Transportation Needs: No Transportation Needs   Lack of Transportation (Medical): No   Lack of Transportation (Non-Medical): No  Physical Activity: Inactive   Days of Exercise per Week: 0 days   Minutes of Exercise per Session: 0 min  Stress: No Stress Concern Present   Feeling of Stress : Only a little  Social Connections: Engineer, building services of Communication with Friends and Family: More than three times a week   Frequency of Social Gatherings with Friends and Family: More than three times a week   Attends Religious Services: More than 4 times per year   Active Member of [employer] or Organizations: Yes   Attends Music therapist: More than 4 times per year   Marital Status: Married    Tobacco Counseling Counseling given: Not Answered   Clinical Intake:  Pre-visit preparation completed: Yes  Pain : No/denies pain Pain Score: 0-No pain     BMI - recorded: 41.88 Nutritional Status: BMI > 30  Obese Nutritional Risks: None Diabetes: Yes CBG done?: No Did pt. bring in CBG monitor from home?: No  How often do you need to have someone help you when you read instructions, pamphlets, or other written materials from your doctor or pharmacy?: 1 - Never  Nutrition Risk Assessment:  Has the patient had any N/V/D within the last 2 months?  Yes  - since cholecystectomy has loose stools and urgency with bowel movements Does the patient have any non-healing wounds?  Yes  scabs from bites all over torso - not  healing well even with creams  Has the patient had any unintentional weight loss or weight gain?  No   Diabetes:  Is the patient diabetic?  Yes  If diabetic, was a CBG obtained today?  No  Did the patient bring in their glucometer from home?  No  How often do you monitor your CBG's? Not often.   Financial Strains and Diabetes Management:  Are you having any financial strains with the device, your supplies or your medication? No .  Does the patient want to be seen by Chronic Care Management for management of their diabetes?  No  Would the patient like to be referred to a Nutritionist or for Diabetic Management?  No   Diabetic Exams:   Diabetic Eye Exam: Completed 02/11/2020.   Diabetic Foot Exam: Completed 09/03/2020. Pt has been advised about the importance in completing this exam. Pt is scheduled for diabetic foot exam on next year.    Interpreter Needed?: No  Information entered by :: Reuven Braver, LPN   Activities of Daily Living In your present state of health, do you have any difficulty performing the following activities: 12/03/2020  Hearing? Y  Comment wear R hearing aid  Vision? N  Difficulty concentrating or making decisions? N  Walking or climbing stairs? Y  Dressing or bathing? N  Doing errands, shopping? Y  Comment only drives locally  Preparing Food and eating ? N  Using the Toilet? N  In the past six months, have you accidently leaked urine? Y  Do you have problems with loss of bowel control? Y  Managing your Medications? N  Managing your Finances? N  Housekeeping or managing your Housekeeping? Y  Comment feet hurt really bad recently  Some recent data might be hidden    Patient Care Team: Dettinger, Fransisca Kaufmann, MD as PCP - General (Family Medicine) Minus Breeding, MD as PCP - Cardiology (Cardiology) Clarene Essex, MD as Consulting Physician (Gastroenterology) McKenzie, Candee Furbish, MD as Consulting Physician (Urology) Ilean China, RN as Case  Manager  Indicate any recent Medical Services you may have received from other than [facility] providers in the past year (date may be approximate).     Assessment:   This is a routine wellness examination for [place].  Hearing/Vision screen Hearing Screening - Comments:: Wears one hearing aid in right ear Vision Screening - Comments:: Wears glasses for reading only. Up to date with annual eye exams with Dr Marica Otter in [place]  Dietary issues and exercise activities discussed: Current Exercise Habits: The patient does not participate in regular exercise at present, Exercise limited by: neurologic condition(s);orthopedic condition(s)   Goals Addressed             This Visit's Progress    DIET - DECREASE SODA OR JUICE INTAKE   On track      Depression Screen PHQ 2/9 Scores 12/03/2020 11/27/2020 11/14/2020 09/03/2020 03/24/2020 02/07/2020 12/03/2019  PHQ - 2 Score 0 0 0 0 0 0 0  PHQ- 9 Score - - - - - - -    Fall Risk Fall Risk  12/03/2020 11/27/2020 11/14/2020 09/03/2020 06/05/2020  Falls in the past year? 0 0 0 0 1  Number falls in past yr: 0 - - - 0  Injury with Fall? 0 - - - 0  Comment - - - - -  Risk Factor Category  - - - - -  Risk for fall due to : Impaired balance/gait;Orthopedic patient;Other (Comment) - - - -  Risk for fall due to: Comment severe  pain in feet, limiting mobility - - - -  Follow up Education provided;Falls prevention discussed - - - -    FALL RISK PREVENTION PERTAINING TO THE HOME:  Any stairs in or around the home? Yes  If so, are there any without handrails? No  Home free of loose throw rugs in walkways, pet beds, electrical cords, etc? Yes  Adequate lighting in your home to reduce risk of falls? Yes   ASSISTIVE DEVICES UTILIZED TO PREVENT FALLS:  Life alert? No  Use of a cane, walker or w/c? Yes  Grab bars in the bathroom? Yes  Shower chair or bench in shower? Yes  Elevated toilet seat or a handicapped toilet? Yes   TIMED UP AND GO:  Was the  test performed? No . Telephonic visit  Cognitive Function: Normal cognitive status assessed by direct observation by this Nurse Health Advisor. No abnormalities found.   MMSE - Mini Mental State Exam 08/29/2017  Orientation to time 5  Orientation to Place 5  Registration 3  Attention/ Calculation 5  Recall 3  Language- name 2 objects 2  Language- repeat 1  Language- follow 3 step command 3  Language- read & follow direction 1  Write a sentence 1  Copy design 1  Total score 30     6CIT Screen 12/03/2019 10/12/2018  What Year? 0 points 0 points  What month? 0 points 0 points  What time? 0 points 0 points  Count back from 20 0 points 0 points  Months in reverse 0 points 0 points  Repeat phrase 2 points 0 points  Total Score 2 0    Immunizations Immunization History  Administered Date(s) Administered   Fluad Quad(high Dose 65+) 01/18/2019, 02/07/2020   Influenza, High Dose Seasonal PF 03/20/2013, 02/24/2016, 02/07/2017, 03/10/2018   Influenza,inj,Quad PF,6+ Mos 02/19/2014, 02/06/2015   Moderna Sars-Covid-2 Vaccination 07/04/2019, 08/01/2019, 03/27/2020, 09/23/2020   Pneumococcal Conjugate-13 08/05/2014   Pneumococcal Polysaccharide-23 02/07/2009   Tdap 05/10/2010   Zoster, Live 05/10/2010    TDAP status: Due, Education has been provided regarding the importance of this vaccine. Advised may receive this vaccine at local pharmacy or Health Dept. Aware to provide a copy of the vaccination record if obtained from local pharmacy or Health Dept. Verbalized acceptance and understanding.  Flu Vaccine status: Up to date  Pneumococcal vaccine status: Up to date  Covid-19 vaccine status: Completed vaccines  Qualifies for Shingles Vaccine? Yes   Zostavax completed Yes   Shingrix Completed?: No.    Education has been provided regarding the importance of this vaccine. Patient has been advised to call insurance company to determine out of pocket expense if they have not yet received this  vaccine. Advised may also receive vaccine at local pharmacy or Health Dept. Verbalized acceptance and understanding.  Screening Tests Health Maintenance  Topic Date Due   Zoster Vaccines- Shingrix (1 of 2) Never done   DEXA SCAN  08/30/2019   TETANUS/TDAP  05/10/2020   MAMMOGRAM  03/05/2022 (Originally 12/05/2019)   INFLUENZA VACCINE  12/08/2020   COVID-19 Vaccine (5 - Booster for Moderna series) 01/24/2021   OPHTHALMOLOGY EXAM  01/27/2021   HEMOGLOBIN A1C  05/30/2021   FOOT EXAM  09/03/2021   COLONOSCOPY (Pts 45-31yr Insurance coverage will need to be confirmed)  10/15/2022   Hepatitis C Screening  Completed   PNA vac Low Risk Adult  Completed   HPV VACCINES  Aged Out    Health Maintenance  Health Maintenance Due  Topic Date Due  Zoster Vaccines- Shingrix (1 of 2) Never done   DEXA SCAN  08/30/2019   TETANUS/TDAP  05/10/2020    Colorectal cancer screening: Type of screening: Colonoscopy. Completed 10/14/2017. Repeat every 5 years  Mammogram status: Completed 12/02/20. Repeat every year  Bone Density status: Completed 08/29/2017. Results reflect: Bone density results: OSTEOPENIA. Repeat every 2 years.  Lung Cancer Screening: (Low Dose CT Chest recommended if Age 41-80 years, 30 pack-year currently smoking OR have quit w/in 15years.) does not qualify.   Additional Screening:  Hepatitis C Screening: does qualify; Completed 05/14/2013  Vision Screening: Recommended annual ophthalmology exams for early detection of glaucoma and other disorders of the eye. Is the patient up to date with their annual eye exam?  Yes  Who is the provider or what is the name of the office in which the patient attends annual eye exams? [facility] If pt is not established with a provider, would they like to be referred to a provider to establish care? No .   Dental Screening: Recommended annual dental exams for proper oral hygiene  Community Resource Referral / Chronic Care Management: CRR required this  visit?  No   CCM required this visit?  No      Plan:     I have personally reviewed and noted the following in the patient's chart:   Medical and social history Use of alcohol, tobacco or illicit drugs  Current medications and supplements including opioid prescriptions.  Functional ability and status Nutritional status Physical activity Advanced directives List of other physicians Hospitalizations, surgeries, and ER visits in previous 12 months Vitals Screenings to include cognitive, depression, and falls Referrals and appointments  In addition, I have reviewed and discussed with patient certain preventive protocols, quality metrics, and best practice recommendations. A written personalized care plan for preventive services as well as general preventive health recommendations were provided to patient.     Sandrea Hammond, LPN   9/47/0962   Nurse Notes: None

## 2020-12-03 NOTE — Patient Instructions (Signed)
[name] , Thank you for taking time to come for your Medicare Wellness Visit. I appreciate your ongoing commitment to your health goals. Please review the following plan we discussed and let me know if I can assist you in the future.   Screening recommendations/referrals: Colonoscopy: Done 10/14/2017 - Repeat in 5 years Mammogram: Done 12/02/20 - Repeat annually Bone Density: Done 08/29/2017 - Repeat every 2 years *due at next visit Recommended yearly ophthalmology/optometry visit for glaucoma screening and checkup Recommended yearly dental visit for hygiene and checkup  Vaccinations: Influenza vaccine: Done 02/07/2020 - Repeat annually  Pneumococcal vaccine: Done 02/07/2009 & 08/05/2014 Tdap vaccine: Done 2012 - Repeat in 10 years -check coverage at pharmacy Shingles vaccine: Zostavax done 2012 - Due. Shingrix discussed. Please contact your pharmacy for coverage information.     Covid-19:Done 07/04/19, 08/01/19, 03/27/20, & 09/23/20  Advanced directives: Please bring a copy of your health care power of attorney and living will to the office to be added to your chart at your convenience.   Conditions/risks identified: Aim for 30 minutes of exercise or brisk walking each day, drink 6-8 glasses of water and eat lots of fruits and vegetables.   Next appointment: Follow up in one year for your annual wellness visit    Preventive Care 65 Years and Older, Female Preventive care refers to lifestyle choices and visits with your health care provider that can promote health and wellness. What does preventive care include? A yearly physical exam. This is also called an annual well check. Dental exams once or twice a year. Routine eye exams. Ask your health care provider how often you should have your eyes checked. Personal lifestyle choices, including: Daily care of your teeth and gums. Regular physical activity. Eating a healthy diet. Avoiding tobacco and drug use. Limiting alcohol  use. Practicing safe sex. Taking low-dose aspirin every day. Taking vitamin and mineral supplements as recommended by your health care provider. What happens during an annual well check? The services and screenings done by your health care provider during your annual well check will depend on your age, overall health, lifestyle risk factors, and family history of disease. Counseling  Your health care provider may ask you questions about your: Alcohol use. Tobacco use. Drug use. Emotional well-being. Home and relationship well-being. Sexual activity. Eating habits. History of falls. Memory and ability to understand (cognition). Work and work Statistician. Reproductive health. Screening  You may have the following tests or measurements: Height, weight, and BMI. Blood pressure. Lipid and cholesterol levels. These may be checked every 5 years, or more frequently if you are over 78 years old. Skin check. Lung cancer screening. You may have this screening every year starting at age 17 if you have a 30-pack-year history of smoking and currently smoke or have quit within the past 15 years. Fecal occult blood test (FOBT) of the stool. You may have this test every year starting at age 2. Flexible sigmoidoscopy or colonoscopy. You may have a sigmoidoscopy every 5 years or a colonoscopy every 10 years starting at age 74. Hepatitis C blood test. Hepatitis B blood test. Sexually transmitted disease (STD) testing. Diabetes screening. This is done by checking your blood sugar (glucose) after you have not eaten for a while (fasting). You may have this done every 1-3 years. Bone density scan. This is done to screen for osteoporosis. You may have this done starting at age 5. Mammogram. This may be done every 1-2 years. Talk to your health care provider about  how often you should have regular mammograms. Talk with your health care provider about your test results, treatment options, and if necessary,  the need for more tests. Vaccines  Your health care provider may recommend certain vaccines, such as: Influenza vaccine. This is recommended every year. Tetanus, diphtheria, and acellular pertussis (Tdap, Td) vaccine. You may need a Td booster every 10 years. Zoster vaccine. You may need this after age 64. Pneumococcal 13-valent conjugate (PCV13) vaccine. One dose is recommended after age 30. Pneumococcal polysaccharide (PPSV23) vaccine. One dose is recommended after age 52. Talk to your health care provider about which screenings and vaccines you need and how often you need them. This information is not intended to replace advice given to you by your health care provider. Make sure you discuss any questions you have with your health care provider. Document Released: 05/23/2015 Document Revised: 01/14/2016 Document Reviewed: 02/25/2015 Elsevier Interactive Patient Education  2017 [place] Prevention in the Home Falls can cause injuries. They can happen to people of all ages. There are many things you can do to make your home safe and to help prevent falls. What can I do on the outside of my home? Regularly fix the edges of walkways and driveways and fix any cracks. Remove anything that might make you trip as you walk through a door, such as a raised step or threshold. Trim any bushes or trees on the path to your home. Use bright outdoor lighting. Clear any walking paths of anything that might make someone trip, such as rocks or tools. Regularly check to see if handrails are loose or broken. Make sure that both sides of any steps have handrails. Any raised decks and porches should have guardrails on the edges. Have any leaves, snow, or ice cleared regularly. Use sand or salt on walking paths during winter. Clean up any spills in your garage right away. This includes oil or grease spills. What can I do in the bathroom? Use night lights. Install grab bars by the toilet and in the  tub and shower. Do not use towel bars as grab bars. Use non-skid mats or decals in the tub or shower. If you need to sit down in the shower, use a plastic, non-slip stool. Keep the floor dry. Clean up any water that spills on the floor as soon as it happens. Remove soap buildup in the tub or shower regularly. Attach bath mats securely with double-sided non-slip rug tape. Do not have throw rugs and other things on the floor that can make you trip. What can I do in the bedroom? Use night lights. Make sure that you have a light by your bed that is easy to reach. Do not use any sheets or blankets that are too big for your bed. They should not hang down onto the floor. Have a firm chair that has side arms. You can use this for support while you get dressed. Do not have throw rugs and other things on the floor that can make you trip. What can I do in the kitchen? Clean up any spills right away. Avoid walking on wet floors. Keep items that you use a lot in easy-to-reach places. If you need to reach something above you, use a strong step stool that has a grab bar. Keep electrical cords out of the way. Do not use floor polish or wax that makes floors slippery. If you must use wax, use non-skid floor wax. Do not have throw rugs and other  things on the floor that can make you trip. What can I do with my stairs? Do not leave any items on the stairs. Make sure that there are handrails on both sides of the stairs and use them. Fix handrails that are broken or loose. Make sure that handrails are as long as the stairways. Check any carpeting to make sure that it is firmly attached to the stairs. Fix any carpet that is loose or worn. Avoid having throw rugs at the top or bottom of the stairs. If you do have throw rugs, attach them to the floor with carpet tape. Make sure that you have a light switch at the top of the stairs and the bottom of the stairs. If you do not have them, ask someone to add them for  you. What else can I do to help prevent falls? Wear shoes that: Do not have high heels. Have rubber bottoms. Are comfortable and fit you well. Are closed at the toe. Do not wear sandals. If you use a stepladder: Make sure that it is fully opened. Do not climb a closed stepladder. Make sure that both sides of the stepladder are locked into place. Ask someone to hold it for you, if possible. Clearly mark and make sure that you can see: Any grab bars or handrails. First and last steps. Where the edge of each step is. Use tools that help you move around (mobility aids) if they are needed. These include: Canes. Walkers. Scooters. Crutches. Turn on the lights when you go into a dark area. Replace any light bulbs as soon as they burn out. Set up your furniture so you have a clear path. Avoid moving your furniture around. If any of your floors are uneven, fix them. If there are any pets around you, be aware of where they are. Review your medicines with your doctor. Some medicines can make you feel dizzy. This can increase your chance of falling. Ask your doctor what other things that you can do to help prevent falls. This information is not intended to replace advice given to you by your health care provider. Make sure you discuss any questions you have with your health care provider. Document Released: 02/20/2009 Document Revised: 10/02/2015 Document Reviewed: 05/31/2014 Elsevier Interactive Patient Education  2017 [employer].

## 2020-12-06 ENCOUNTER — Other Ambulatory Visit: Payer: Self-pay | Admitting: Family Medicine

## 2020-12-06 DIAGNOSIS — U071 COVID-19: Secondary | ICD-10-CM

## 2020-12-06 MED ORDER — MOLNUPIRAVIR EUA 200MG CAPSULE: 4.0000 | ORAL_CAPSULE | Freq: Two times a day (BID) | ORAL | 0 refills | Status: AC

## 2020-12-06 NOTE — Progress Notes (Signed)
Karen Potter contacted the nurse access line today. She started having Covid symptoms yesterday and tested positive for Covid today with a home test. She is reporting cough, congestion, and fatigue. Denies fever, chest pain, or shortness of breath. She does have a history of HTN, DM, and previous TIAs, making her high risk for complications from Covid infection. Discussed molnupiravir BID x 5 days for antiviral treatment. Discussed hydration, rest, plan Mucinex for cough/congestion, tylenol for pain/fever. Go to ED for chest pain, shortness of breath.

## 2021-01-20 ENCOUNTER — Other Ambulatory Visit: Payer: Self-pay | Admitting: Family Medicine

## 2021-01-28 DIAGNOSIS — I10 Essential (primary) hypertension: Secondary | ICD-10-CM | POA: Diagnosis not present

## 2021-01-28 DIAGNOSIS — Z961 Presence of intraocular lens: Secondary | ICD-10-CM | POA: Diagnosis not present

## 2021-01-28 DIAGNOSIS — Z9849 Cataract extraction status, unspecified eye: Secondary | ICD-10-CM | POA: Diagnosis not present

## 2021-01-28 DIAGNOSIS — H35033 Hypertensive retinopathy, bilateral: Secondary | ICD-10-CM | POA: Diagnosis not present

## 2021-01-28 LAB — HM DIABETES EYE EXAM

## 2021-02-17 ENCOUNTER — Other Ambulatory Visit: Payer: Self-pay

## 2021-02-17 ENCOUNTER — Encounter: Payer: Self-pay | Admitting: Family Medicine

## 2021-02-17 ENCOUNTER — Ambulatory Visit (INDEPENDENT_AMBULATORY_CARE_PROVIDER_SITE_OTHER): Payer: Medicare Other | Admitting: Family Medicine

## 2021-02-17 VITALS — BP 130/77 | HR 71 | Ht 64.0 in | Wt 245.0 lb

## 2021-02-17 DIAGNOSIS — Z23 Encounter for immunization: Secondary | ICD-10-CM | POA: Diagnosis not present

## 2021-02-17 DIAGNOSIS — M109 Gout, unspecified: Secondary | ICD-10-CM | POA: Diagnosis not present

## 2021-02-17 DIAGNOSIS — E1169 Type 2 diabetes mellitus with other specified complication: Secondary | ICD-10-CM

## 2021-02-17 LAB — BAYER DCA HB A1C WAIVED: HB A1C (BAYER DCA - WAIVED): 7.2 % — ABNORMAL HIGH (ref 4.8–5.6)

## 2021-02-17 NOTE — Progress Notes (Signed)
BP 130/77   Pulse 71   Ht _0  (1.626 m)   Wt 245 lb (111.1 kg)   SpO2 97%   BMI 42.05 kg/m    Subjective:   Patient ID: Karen Potter, female    DOB: August 29, 1940, 80 y.o.   MRN: [id number]  HPI: Karen Potter is a 80 y.o. female presenting on 02/17/2021 for Foot Pain (Right. Started on Sat. Unable to walk at the time. Since making appt pain has improved)   HPI Patient comes in today complaining of right foot pain that is just near her ankle that has been bothering her over the past 4 days.  She says the pain has been very significant and very intense and is kept her from walking.  She says she has had a few flareups like this over the past couple years.  She says is always very debilitating when she has it.  She denies any redness or warmth or fevers or chills.  She did take some Aleve yesterday and is feeling somewhat better today.  Patient was going to do her blood work today.  Relevant past medical, surgical, family and social history reviewed and updated as indicated. Interim medical history since our last visit reviewed. Allergies and medications reviewed and updated.  Review of Systems  Constitutional:  Negative for chills and fever.  Eyes:  Negative for visual disturbance.  Respiratory:  Negative for chest tightness and shortness of breath.   Cardiovascular:  Negative for chest pain and leg swelling.  Musculoskeletal:  Positive for arthralgias and gait problem. Negative for back pain and joint swelling.  Skin:  Negative for color change, rash and wound.  Neurological:  Negative for light-headedness and headaches.  Psychiatric/Behavioral:  Negative for agitation and behavioral problems.   All other systems reviewed and are negative.  Per HPI unless specifically indicated above   Allergies as of 02/17/2021       Reactions   Codeine Itching   Dilaudid [hydromorphone] Other (See Comments)   Mouth blisters   Other Itching   Most pain meds cause itching.  When  has to take pain medication, she has been instructed to take Benadryl   Doxycycline Rash        Medication List        Accurate as of February 17, 2021  8:54 AM. If you have any questions, ask your nurse or doctor.          STOP taking these medications    cephALEXin 500 MG capsule Commonly known as: KEFLEX Stopped by: Fransisca Kaufmann Gerold Sar, MD       TAKE these medications    acetaminophen 650 MG CR tablet Commonly known as: TYLENOL Take 650 mg by mouth in the morning.   ALLERGY MED PO Take by mouth daily.   aspirin 325 MG tablet Take 325 mg by mouth every evening.   Blood Glucose Monitor System w/Device Kit Use glucose meter to check blood sugar up to 3 times daily as instructed. One Touch Verio preferred by insurance. 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   ELDERBERRY PO Take by mouth.   Fish Oil Triple Strength 1400 MG Caps Take 1,000 mg by mouth 4 (four) times daily.   furosemide 40 MG tablet Commonly known as: LASIX TAKE 1 TABLET BY MOUTH  DAILY   glucose blood test strip One Touch Verio-  Use with glucose meter to check blood sugar up to 3 times daily as instructed. Dx: E11.69   hydrocortisone 1 % lotion Apply 1 application topically 2 (two) times daily.   Lancets Misc. Kit Use patient & insurance preferred lancet device to test blood glucose up to 3 times daily for Dx: E11.69   levothyroxine 150 MCG tablet Commonly known as: Euthyrox Take 1 tablet (150 mcg total) by mouth daily.   losartan 25 MG tablet Commonly known as: COZAAR Take 1 tablet (25 mg total) by mouth daily.   naproxen 500 MG tablet Commonly known as: NAPROSYN Take 1 tablet (500 mg total) by mouth 2 (two) times daily.   omeprazole 20 MG capsule Commonly known as: PRILOSEC Take 1 capsule (20 mg total) by mouth 2 (two) times daily before a meal.   Vitamin D3 50 MCG (2000 UT)  capsule Take 2,000 Units by mouth 2 (two) times daily.         Objective:   BP 130/77   Pulse 71   Ht _0  (1.626 m)   Wt 245 lb (111.1 kg)   SpO2 97%   BMI 42.05 kg/m   Wt Readings from Last 3 Encounters:  02/17/21 245 lb (111.1 kg)  12/03/20 244 lb (110.7 kg)  11/27/20 244 lb (110.7 kg)    Physical Exam Vitals and nursing note reviewed.  Constitutional:      Appearance: Normal appearance.  Musculoskeletal:     Right ankle: Tenderness present.       Legs:  Neurological:     Mental Status: She is alert.      Assessment & Plan:   Problem List Items Addressed This Visit       Endocrine   Type 2 diabetes mellitus with other specified complication ([place])   Relevant Orders   Bayer DCA Hb A1c Waived   Lipid panel   Other Visit Diagnoses     Acute gout of right ankle, unspecified cause    -  Primary   Relevant Orders   CMP14+EGFR   Uric acid       Likely gout based on symptoms minus the inflammation.  We will test uric acid levels but with recurrence also fits likely gout.  Discussed possibility of allopurinol in the future and we will have more discussion after she has her uric acid levels back Follow up plan: Return in about 3 months (around 05/20/2021), or if symptoms worsen or fail to improve, for Diabetes recheck.  Counseling provided for all of the vaccine components Orders Placed This Encounter  Procedures   CMP14+EGFR   Uric acid   Bayer DCA Hb A1c Waived   Lipid panel    Caryl Pina, MD [facility] 02/17/2021, 8:54 AM

## 2021-02-17 NOTE — Patient Instructions (Signed)
Gout Gout is painful swelling of your joints. Gout is a type of arthritis. It is caused by having too much uric acid in your body. Uric acid is a chemical that is made when your body breaks down substances called purines. If your body has too much uric acid, sharp crystals can form and build up in your joints. This causes pain and swelling. Gout attacks can happen quickly and be very painful (acute gout). Over time, the attacks can affect more joints and happen more often (chronic gout). What are the causes? Too much uric acid in your blood. This can happen because: Your kidneys do not remove enough uric acid from your blood. Your body makes too much uric acid. You eat too many foods that are high in purines. These foods include organ meats, some seafood, and beer. Trauma or stress. What increases the risk? Having a family history of gout. Being female and middle-aged. Being female and having gone through menopause. Being very overweight (obese). Drinking alcohol, especially beer. Not having enough water in the body (being dehydrated). Losing weight too quickly. Having an organ transplant. Having lead poisoning. Taking certain medicines. Having kidney disease. Having a skin condition called psoriasis. What are the signs or symptoms? An attack of acute gout usually happens in just one joint. The most common place is the big toe. Attacks often start at night. Other joints that may be affected include joints of the feet, ankle, knee, fingers, wrist, or elbow. Symptoms of an attack may include: Very bad pain. Warmth. Swelling. Stiffness. Shiny, red, or purple skin. Tenderness. The affected joint may be very painful to touch. Chills and fever. Chronic gout may cause symptoms more often. More joints may be involved. You may also have white or yellow lumps (tophi) on your hands or feet or in other areas near your joints. How is this treated? Treatment for this condition has two phases:  treating an acute attack and preventing future attacks. Acute gout treatment may include: NSAIDs. Steroids. These are taken by mouth or injected into a joint. Colchicine. This medicine relieves pain and swelling. It can be given by mouth or through an IV tube. Preventive treatment may include: Taking small doses of NSAIDs or colchicine daily. Using a medicine that reduces uric acid levels in your blood. Making changes to your diet. You may need to see a food expert (dietitian) about what to eat and drink to prevent gout. Follow these instructions at home: During a gout attack  If told, put ice on the painful area: Put ice in a plastic bag. Place a towel between your skin and the bag. Leave the ice on for 20 minutes, 2-3 times a day. Raise (elevate) the painful joint above the level of your heart as often as you can. Rest the joint as much as possible. If the joint is in your leg, you may be given crutches. Follow instructions from your doctor about what you cannot eat or drink. Avoiding future gout attacks Eat a low-purine diet. Avoid foods and drinks such as: Liver. Kidney. Anchovies. Asparagus. Herring. Mushrooms. Mussels. Beer. Stay at a healthy weight. If you want to lose weight, talk with your doctor. Do not lose weight too fast. Start or continue an exercise plan as told by your doctor. Eating and drinking Drink enough fluids to keep your pee (urine) pale yellow. If you drink alcohol: Limit how much you use to: 0-1 drink a day for women. 0-2 drinks a day for men. Be aware of   how much alcohol is in your drink. In the [place] one drink equals one 12 oz bottle of beer (355 mL), one 5 oz glass of wine (148 mL), or one 1 oz glass of hard liquor (44 mL). General instructions Take over-the-counter and prescription medicines only as told by your doctor. Do not drive or use heavy machinery while taking prescription pain medicine. Return to your normal activities as told by your  doctor. Ask your doctor what activities are safe for you. Keep all follow-up visits as told by your doctor. This is important. Contact a doctor if: You have another gout attack. You still have symptoms of a gout attack after 10 days of treatment. You have problems (side effects) because of your medicines. You have chills or a fever. You have burning pain when you pee (urinate). You have pain in your lower back or belly. Get help right away if: You have very bad pain. Your pain cannot be controlled. You cannot pee. Summary Gout is painful swelling of the joints. The most common site of pain is the big toe, but it can affect other joints. Medicines and avoiding some foods can help to prevent and treat gout attacks. This information is not intended to replace advice given to you by your health care provider. Make sure you discuss any questions you have with your health care provider. Document Revised: 11/16/2017 Document Reviewed: 11/16/2017 Elsevier Patient Education  2022 [employer]  

## 2021-02-18 LAB — CMP14+EGFR
ALT: 21 IU/L (ref 0–32)
AST: 21 IU/L (ref 0–40)
Albumin/Globulin Ratio: 1.3 (ref 1.2–2.2)
Albumin: 3.9 g/dL (ref 3.7–4.7)
Alkaline Phosphatase: 83 IU/L (ref 44–121)
BUN/Creatinine Ratio: 17 (ref 12–28)
BUN: 14 mg/dL (ref 8–27)
Bilirubin Total: 0.4 mg/dL (ref 0.0–1.2)
CO2: 27 mmol/L (ref 20–29)
Calcium: 9 mg/dL (ref 8.7–10.3)
Chloride: 103 mmol/L (ref 96–106)
Creatinine, Ser: 0.83 mg/dL (ref 0.57–1.00)
Globulin, Total: 2.9 g/dL (ref 1.5–4.5)
Glucose: 152 mg/dL — ABNORMAL HIGH (ref 70–99)
Potassium: 3.8 mmol/L (ref 3.5–5.2)
Sodium: 143 mmol/L (ref 134–144)
Total Protein: 6.8 g/dL (ref 6.0–8.5)
eGFR: 72 mL/min/{1.73_m2} (ref >59)

## 2021-02-18 LAB — LIPID PANEL
Chol/HDL Ratio: 4.6 ratio — ABNORMAL HIGH (ref 0.0–4.4)
Cholesterol, Total: 139 mg/dL (ref 100–199)
HDL: 30 mg/dL — ABNORMAL LOW (ref >39)
LDL Chol Calc (NIH): 72 mg/dL (ref 0–99)
Triglycerides: 226 mg/dL — ABNORMAL HIGH (ref 0–149)
VLDL Cholesterol Cal: 37 mg/dL (ref 5–40)

## 2021-02-18 LAB — URIC ACID: Uric Acid: 9.4 mg/dL — ABNORMAL HIGH (ref 3.1–7.9)

## 2021-02-19 ENCOUNTER — Ambulatory Visit: Payer: Medicare Other | Admitting: Family Medicine

## 2021-02-20 ENCOUNTER — Telehealth: Payer: Self-pay | Admitting: Family Medicine

## 2021-02-20 NOTE — Telephone Encounter (Signed)
Patient is calling about lab results - will send to covering provider and PCP

## 2021-02-20 NOTE — Telephone Encounter (Signed)
PCP to address.

## 2021-02-24 ENCOUNTER — Other Ambulatory Visit: Payer: Self-pay

## 2021-02-24 DIAGNOSIS — I152 Hypertension secondary to endocrine disorders: Secondary | ICD-10-CM

## 2021-02-24 DIAGNOSIS — E1159 Type 2 diabetes mellitus with other circulatory complications: Secondary | ICD-10-CM

## 2021-02-24 DIAGNOSIS — E1169 Type 2 diabetes mellitus with other specified complication: Secondary | ICD-10-CM

## 2021-02-24 MED ORDER — LOSARTAN POTASSIUM 25 MG PO TABS: 25.0000 mg | ORAL_TABLET | Freq: Every day | ORAL | 3 refills | Status: DC

## 2021-02-24 MED ORDER — CARVEDILOL 6.25 MG PO TABS
6.2500 mg | ORAL_TABLET | Freq: Two times a day (BID) | ORAL | 3 refills | Status: DC
Start: 2021-02-24 — End: 2021-05-29

## 2021-02-24 MED ORDER — OMEPRAZOLE 20 MG PO CPDR: 20.0000 mg | DELAYED_RELEASE_CAPSULE | Freq: Two times a day (BID) | ORAL | 3 refills | Status: DC

## 2021-02-24 MED ORDER — LEVOTHYROXINE SODIUM 150 MCG PO TABS: 150.0000 ug | ORAL_TABLET | Freq: Every day | ORAL | 3 refills | Status: DC

## 2021-02-25 MED ORDER — ALLOPURINOL 100 MG PO TABS: 100.0000 mg | ORAL_TABLET | Freq: Every day | ORAL | 3 refills | Status: DC

## 2021-02-25 NOTE — Addendum Note (Signed)
Addended by: Caryl Pina on: 02/25/2021 08:33 AM   Modules accepted: Orders

## 2021-03-05 ENCOUNTER — Ambulatory Visit (INDEPENDENT_AMBULATORY_CARE_PROVIDER_SITE_OTHER): Payer: Medicare Other | Admitting: Family Medicine

## 2021-03-05 ENCOUNTER — Other Ambulatory Visit: Payer: Self-pay

## 2021-03-05 ENCOUNTER — Encounter: Payer: Self-pay | Admitting: Family Medicine

## 2021-03-05 VITALS — BP 132/75 | HR 67 | Temp 98.1°F | Ht 64.0 in | Wt 242.5 lb

## 2021-03-05 DIAGNOSIS — L0291 Cutaneous abscess, unspecified: Secondary | ICD-10-CM

## 2021-03-05 MED ORDER — CEPHALEXIN 500 MG PO CAPS: 500.0000 mg | ORAL_CAPSULE | Freq: Two times a day (BID) | ORAL | 0 refills | Status: AC

## 2021-03-05 NOTE — Progress Notes (Signed)
Acute Office Visit  Subjective:    Patient ID: Karen Potter, female    DOB: Oct 22, 1940, 80 y.o.   MRN: [id number]  Chief Complaint  Patient presents with   Wound Check    HPI Patient is in today for a wound on her right lower abdomen in a skin fold. She reports that she has had a dark area for for about 1 year or so. It started bleeding two days ago. She has also had brown drainage. The area now looks red around the wound. She denies fever, chills, or pain. She has been using triple antibiotic ointment. She is leaving for vacation tomorrow for the next few weeks.   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   age 27--  pt states was quarantined   no treatment ;  per pt no symptoms or issues since   History of kidney stones    History of TIA (transient ischemic attack)    03-23-2014  w/ episode temporay amnesia   Hyperlipidemia    Hypertension    Hypothyroidism    Iron deficiency anemia    LAFB (left anterior fascicular block)    OA (osteoarthritis)    OSA on CPAP    severe per study 2003   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   Post-polio limb muscle weakness    left leg  w/ 3 repair surgery's   RBBB (right bundle branch block)    Seasonal allergies    Type 2 diabetes mellitus ([place])    Vitamin D deficiency     Past Surgical History:  Procedure Laterality Date   CARPAL TUNNEL RELEASE Right 05-10-2007   CHOLECYSTECTOMY OPEN  1985   and Appendectomy   CYSTO/  RIGHT URETEROSCOPIC STONE EXTRACTON/  STENT PLACEMENT  06/ 2016   in [place]    CYSTOSCOPY/RETROGRADE/URETEROSCOPY/STONE EXTRACTION WITH BASKET Right 11/17/2015   Procedure: CYSTOSCOPY/RETROGRADE PYELOGRAM RIGHT/DIAGNOSTIC RIGHT URETEROSCOPY/LASER RENAL CALCIFICATION/RIGHT STENT PLACEMENT;  Surgeon: Cleon Gustin, MD;  Location: [facility]  Service: Urology;  Laterality: Right;   ESOPHAGOGASTRODUODENOSCOPY N/A 08/29/2015   Procedure: ESOPHAGOGASTRODUODENOSCOPY (EGD);  Surgeon: Danie Binder, MD;  Location: [facility];  Service: Endoscopy;  Laterality: N/A;   HOLMIUM LASER APPLICATION Right 2/95/2841   Procedure: HOLMIUM LASER APPLICATION;  Surgeon: Cleon Gustin, MD;  Location: [facility];  Service: Urology;  Laterality: Right;   KNEE ARTHROSCOPY Right [place];  1985;  1995   polio   LUMBAR SPINE SURGERY  09-01-2010   L4 -L5 fusion,  L3 laminectomy,  L5 - S1 foraminotomy   OVARIAN CYST SURGERY Right 1966   PARATHYROIDECTOMY N/A 05/06/2015   Procedure: PARATHYROIDECTOMY;  Surgeon: Armandina Gemma, MD;  Location: [facility];  Service: General;  Laterality: N/A;   left superior   TONSILLECTOMY  as child   TOTAL ABDOMINAL HYSTERECTOMY W/ BILATERAL SALPINGOOPHORECTOMY  1984   TOTAL KNEE ARTHROPLASTY Right 04-08-2008  TRANSTHORACIC ECHOCARDIOGRAM  04-07-2012   grade 1 diastolic function,  ef 71-21%/  mild AV calcification without stenosis/  trivial MR    Family History  Problem Relation Age of Onset   Heart failure Mother        No details.  No MI   Cancer Father        Lung   Cancer Brother        Lung   Stroke Maternal Grandfather    Cancer Paternal Grandmother        rectal   Cancer Paternal Grandfather        gastric    Social History   Socioeconomic History   Marital status: Married    Spouse name: Mickey   Number of children: 4   Years of education: 13-some college   Highest education level: High school graduate  Occupational History   Occupation: Retired  Tobacco Use   Smoking  status: Never   Smokeless tobacco: Never  Scientific laboratory technician Use: Never used  Substance and Sexual Activity   Alcohol use: No   Drug use: No   Sexual activity: Not Currently    Birth control/protection: Surgical  Other Topics Concern   Not on file  Social History Narrative   Lives with husband.  Four children.     Drinks 1-2 cokes per day, but usually drinks water   All children live fairly close. Son is closest, but works 12-13 hours/day.   Disabled daughter in [place] that she helps care for.   Another daughter lives within 15 minutes and takes her shopping and to out of town doctor visits.   Social Determinants of Health   Financial Resource Strain: Low Risk    Difficulty of Paying Living Expenses: Not hard at all  Food Insecurity: No Food Insecurity   Worried About Charity fundraiser in the Last Year: Never true   Between in the Last Year: Never true  Transportation Needs: No Transportation Needs   Lack of Transportation (Medical): No   Lack of Transportation (Non-Medical): No  Physical Activity: Inactive   Days of Exercise per Week: 0 days   Minutes of Exercise per Session: 0 min  Stress: No Stress Concern Present   Feeling of Stress : Only a little  Social Connections: Engineer, building services of Communication with Friends and Family: More than three times a week   Frequency of Social Gatherings with Friends and Family: More than three times a week   Attends Religious Services: More than 4 times per year   Active Member of [employer] or Organizations: Yes   Attends Music therapist: More than 4 times per year   Marital Status: Married  Human resources officer Violence: Not At Risk   Fear of Current or Ex-Partner: No   Emotionally Abused: No   Physically Abused: No   Sexually Abused: No    Outpatient Medications Prior to Visit  Medication Sig Dispense Refill   acetaminophen (TYLENOL) 650 MG CR tablet Take 650 mg by mouth in the morning.      allopurinol (ZYLOPRIM) 100 MG tablet Take 1 tablet (100 mg total) by mouth daily. 90 tablet 3   aspirin 325 MG tablet Take 325 mg by mouth every evening.      Blood Glucose Monitoring Suppl (BLOOD GLUCOSE MONITOR SYSTEM) w/Device KIT Use glucose meter to check blood sugar up to 3 times daily as instructed. One Touch Verio preferred by insurance. Dx:  EE11.69 1 each 0   carvedilol (COREG) 6.25 MG tablet Take 1 tablet (6.25 mg total) by mouth 2 (two) times daily with a meal. 180 tablet 3   Cholecalciferol (VITAMIN D3) 2000 UNITS capsule Take 2,000 Units by mouth 2 (two) times daily.      diphenhydrAMINE HCl (ALLERGY MED PO) Take by mouth daily.     ELDERBERRY PO Take by mouth.     furosemide (LASIX) 40 MG tablet TAKE 1 TABLET BY MOUTH  DAILY 90 tablet 1   glucose blood test strip One Touch Verio- Use with glucose meter to check blood sugar up to 3 times daily as instructed. Dx: E11.69 100 each 12   hydrocortisone 1 % lotion Apply 1 application topically 2 (two) times daily. 118 mL 0   Lancets Misc. KIT Use patient & insurance preferred lancet device to test blood glucose up to 3 times daily for Dx: E11.69 100 each 12   levothyroxine (EUTHYROX) 150 MCG tablet Take 1 tablet (150 mcg total) by mouth daily. 90 tablet 3   losartan (COZAAR) 25 MG tablet Take 1 tablet (25 mg total) by mouth daily. 90 tablet 3   Omega-3 Fatty Acids (FISH OIL TRIPLE STRENGTH) 1400 MG CAPS Take 1,000 mg by mouth 4 (four) times daily.      omeprazole (PRILOSEC) 20 MG capsule Take 1 capsule (20 mg total) by mouth 2 (two) times daily before a meal. 180 capsule 3   naproxen (NAPROSYN) 500 MG tablet Take 1 tablet (500 mg total) by mouth 2 (two) times daily. (Patient not taking: Reported on 03/05/2021) 30 tablet 0   No facility-administered medications prior to visit.    Allergies  Allergen Reactions   Codeine Itching   Dilaudid [Hydromorphone] Other (See Comments)    Mouth blisters   Other Itching    Most pain meds cause  itching.  When has to take pain medication, she has been instructed to take Benadryl   Doxycycline Rash    Review of Systems As per HPI.    Objective:    Physical Exam Vitals and nursing note reviewed.  Constitutional:      General: She is not in acute distress.    Appearance: She is not ill-appearing, toxic-appearing or diaphoretic.  Pulmonary:     Effort: Pulmonary effort is normal. No respiratory distress.  Skin:    General: Skin is warm and dry.     Findings: Abscess present.     Comments: Open abscess to right lower abdomen in skin fold. Wound measure 0.5 cm x 0.5 cm. Shallow with pink base. Surrounding erythema and warmth.   Neurological:     Mental Status: She is alert and oriented to person, place, and time.  Psychiatric:        Mood and Affect: Mood normal.        Behavior: Behavior normal.    BP 132/75   Pulse 67   Temp 98.1 F (36.7 C) (Temporal)   Ht '5\' 4"'  (1.626 m)   Wt 242 lb 8 oz (110 kg)   BMI 41.63 kg/m  Wt Readings from Last 3 Encounters:  03/05/21 242 lb 8 oz (110 kg)  02/17/21 245 lb (111.1 kg)  12/03/20 244 lb (110.7 kg)    Health Maintenance Due  Topic Date Due   Zoster Vaccines- Shingrix (1 of 2) Never done   DEXA SCAN  08/30/2019   TETANUS/TDAP  05/10/2020   COVID-19 Vaccine (5 - Booster for Moderna series) 11/18/2020  There are no preventive care reminders to display for this patient.   Lab Results  Component Value Date   TSH 0.904 09/03/2020   Lab Results  Component Value Date   WBC 6.2 09/03/2020   HGB 13.2 09/03/2020   HCT 38.8 09/03/2020   MCV 88 09/03/2020   PLT 250 09/03/2020   Lab Results  Component Value Date   NA 143 02/17/2021   K 3.8 02/17/2021   CO2 27 02/17/2021   GLUCOSE 152 (H) 02/17/2021   BUN 14 02/17/2021   CREATININE 0.83 02/17/2021   BILITOT 0.4 02/17/2021   ALKPHOS 83 02/17/2021   AST 21 02/17/2021   ALT 21 02/17/2021   PROT 6.8 02/17/2021   ALBUMIN 3.9 02/17/2021   CALCIUM 9.0 02/17/2021    ANIONGAP 8 08/16/2019   EGFR 72 02/17/2021   Lab Results  Component Value Date   CHOL 139 02/17/2021   Lab Results  Component Value Date   HDL 30 (L) 02/17/2021   Lab Results  Component Value Date   LDLCALC 72 02/17/2021   Lab Results  Component Value Date   TRIG 226 (H) 02/17/2021   Lab Results  Component Value Date   CHOLHDL 4.6 (H) 02/17/2021   Lab Results  Component Value Date   HGBA1C 7.2 (H) 02/17/2021       Assessment & Plan:   Caisley was seen today for wound check.  Diagnoses and all orders for this visit:  Abscess Keflex as below. Discussed wound care and return precautions.  -     cephALEXin (KEFLEX) 500 MG capsule; Take 1 capsule (500 mg total) by mouth 2 (two) times daily for 10 days.  Return to office for new or worsening symptoms, or if symptoms persist.   The patient indicates understanding of these issues and agrees with the plan.  Gwenlyn Perking, FNP

## 2021-03-05 NOTE — Patient Instructions (Signed)
Skin Abscess  A skin abscess is an infected area on or under your skin that contains a collection of pus and other material. An abscess may also be called a furuncle,carbuncle, or boil. An abscess can occur in or on almost any part of your body. Some abscesses break open (rupture) on their own. Most continue to get worse unless they are treated. The infection can spread deeper into the body and eventually into your blood, whichcan make you feel ill. Treatment usually involves draining the abscess. What are the causes? An abscess occurs when germs, like bacteria, pass through your skin and cause an infection. This may be caused by: A scrape or cut on your skin. A puncture wound through your skin, including a needle injection or insect bite. Blocked oil or sweat glands. Blocked and infected hair follicles. A cyst that forms beneath your skin (sebaceous cyst) and becomes infected. What increases the risk? This condition is more likely to develop in people who: Have a weak body defense system (immune system). Have diabetes. Have dry and irritated skin. Get frequent injections or use illegal IV drugs. Have a foreign body in a wound, such as a splinter. Have problems with their lymph system or veins. What are the signs or symptoms? Symptoms of this condition include: A painful, firm bump under the skin. A bump with pus at the top. This may break through the skin and drain. Other symptoms include: Redness surrounding the abscess site. Warmth. Swelling of the lymph nodes (glands) near the abscess. Tenderness. A sore on the skin. How is this diagnosed? This condition may be diagnosed based on: A physical exam. Your medical history. A sample of pus. This may be used to find out what is causing the infection. Blood tests. Imaging tests, such as an ultrasound, CT scan, or MRI. How is this treated? A small abscess that drains on its own may not need treatment. Treatment for larger abscesses  may include: Moist heat or heat pack applied to the area several times a day. A procedure to drain the abscess (incision and drainage). Antibiotic medicines. For a severe abscess, you may first get antibiotics through an IV and then change to antibiotics by mouth. Follow these instructions at home: Medicines  Take over-the-counter and prescription medicines only as told by your health care provider. If you were prescribed an antibiotic medicine, take it as told by your health care provider. Do not stop taking the antibiotic even if you start to feel better.  Abscess care  If you have an abscess that has not drained, apply heat to the affected area. Use the heat source that your health care provider recommends, such as a moist heat pack or a heating pad. Place a towel between your skin and the heat source. Leave the heat on for 20-30 minutes. Remove the heat if your skin turns bright red. This is especially important if you are unable to feel pain, heat, or cold. You may have a greater risk of getting burned. Follow instructions from your health care provider about how to take care of your abscess. Make sure you: Cover the abscess with a bandage (dressing). Change your dressing or gauze as told by your health care provider. Wash your hands with soap and water before you change the dressing or gauze. If soap and water are not available, use hand sanitizer. Check your abscess every day for signs of a worsening infection. Check for: More redness, swelling, or pain. More fluid or blood. Warmth. More   pus or a bad smell.  General instructions To avoid spreading the infection: Do not share personal care items, towels, or hot tubs with others. Avoid making skin contact with other people. Keep all follow-up visits as told by your health care provider. This is important. Contact a health care provider if you have: More redness, swelling, or pain around your abscess. More fluid or blood coming  from your abscess. Warm skin around your abscess. More pus or a bad smell coming from your abscess. A fever. Muscle aches. Chills or a general ill feeling. Get help right away if you: Have severe pain. See red streaks on your skin spreading away from the abscess. Summary A skin abscess is an infected area on or under your skin that contains a collection of pus and other material. A small abscess that drains on its own may not need treatment. Treatment for larger abscesses may include having a procedure to drain the abscess and taking an antibiotic. This information is not intended to replace advice given to you by your health care provider. Make sure you discuss any questions you have with your healthcare provider. Document Revised: 08/17/2018 Document Reviewed: 06/09/2017 Elsevier Patient Education  2022 Elsevier Inc.  

## 2021-05-01 ENCOUNTER — Telehealth: Payer: Self-pay | Admitting: Family Medicine

## 2021-05-01 NOTE — Telephone Encounter (Signed)
Aware and verbalizes understanding.  

## 2021-05-01 NOTE — Telephone Encounter (Signed)
Allopurinol everyday since last appt.  Gout flare since Tuesday. One knee and both feet   Should she stop the medication?

## 2021-05-01 NOTE — Telephone Encounter (Signed)
No do not stop it if you have already been taking it, its only a problem if you started newly during a flare.  Go ahead and treat the flare with your other medicines such as colchicine or anti-inflammatories.

## 2021-05-05 ENCOUNTER — Other Ambulatory Visit: Payer: Self-pay | Admitting: Family Medicine

## 2021-05-12 ENCOUNTER — Encounter: Payer: Self-pay | Admitting: Family Medicine

## 2021-05-12 ENCOUNTER — Ambulatory Visit (INDEPENDENT_AMBULATORY_CARE_PROVIDER_SITE_OTHER): Payer: PPO | Admitting: Family Medicine

## 2021-05-12 VITALS — BP 139/79 | HR 74 | Temp 97.0°F | Ht 64.0 in | Wt 241.8 lb

## 2021-05-12 DIAGNOSIS — J988 Other specified respiratory disorders: Secondary | ICD-10-CM | POA: Diagnosis not present

## 2021-05-12 DIAGNOSIS — B9689 Other specified bacterial agents as the cause of diseases classified elsewhere: Secondary | ICD-10-CM | POA: Diagnosis not present

## 2021-05-12 MED ORDER — BENZONATATE 100 MG PO CAPS: 100.0000 mg | ORAL_CAPSULE | Freq: Three times a day (TID) | ORAL | 0 refills | Status: DC | PRN

## 2021-05-12 MED ORDER — METHYLPREDNISOLONE 4 MG PO TBPK: ORAL_TABLET | ORAL | 0 refills | Status: DC

## 2021-05-12 MED ORDER — AZITHROMYCIN 250 MG PO TABS
ORAL_TABLET | ORAL | 0 refills | Status: DC
Start: 2021-05-12 — End: 2021-05-18

## 2021-05-12 NOTE — Progress Notes (Signed)
**Note Karen-Identified via Obfuscation** Assessment & Plan:  1. Bacterial respiratory infection Education provided on URIs. - azithromycin (ZITHROMAX Z-PAK) 250 MG tablet; Take 2 tablets (500 mg) PO today, then 1 tablet (250 mg) PO daily x4 days.  Dispense: 6 tablet; Refill: 0 - methylPREDNISolone (MEDROL DOSEPAK) 4 MG TBPK tablet; Use as directed.  Dispense: 21 each; Refill: 0 - benzonatate (TESSALON PERLES) 100 MG capsule; Take 1 capsule (100 mg total) by mouth 3 (three) times daily as needed for cough.  Dispense: 30 capsule; Refill: 0   Follow up plan: Return if symptoms worsen or fail to improve.  Hendricks Limes, MSN, APRN, FNP-C [facility]  Subjective:   Patient ID: Karen Potter, female    DOB: [birth year], 81 y.o.   MRN: [id number]  HPI: Karen Potter is a 81 y.o. female presenting on 05/12/2021 for Cough and Nasal Congestion (X 5 days )  Patient complains of cough, head/chest congestion, headache, runny nose, sneezing, sore throat, shortness of breath, and wheezing. Onset of symptoms was 6 days ago, gradually worsening since that time. She is drinking plenty of fluids. Evaluation to date: at home COVID test negative. Treatment to date: cough suppressants, decongestants, and Mucinex . She does not smoke. Patient has been fully vaccinated against COVID-19.   ROS: Negative unless specifically indicated above in HPI.   Relevant past medical history reviewed and updated as indicated.   Allergies and medications reviewed and updated.   Current Outpatient Medications:    acetaminophen (TYLENOL) 650 MG CR tablet, Take 650 mg by mouth in the morning., Disp: , Rfl:    allopurinol (ZYLOPRIM) 100 MG tablet, Take 1 tablet (100 mg total) by mouth daily., Disp: 90 tablet, Rfl: 3   aspirin 325 MG tablet, Take 325 mg by mouth every evening. , Disp: , Rfl:    Blood Glucose Monitoring Suppl (BLOOD GLUCOSE MONITOR SYSTEM) w/Device KIT, Use glucose meter to check blood sugar up to 3 times daily as instructed.  One Touch Verio preferred by insurance. Dx: EE11.69, Disp: 1 each, Rfl: 0   carvedilol (COREG) 6.25 MG tablet, Take 1 tablet (6.25 mg total) by mouth 2 (two) times daily with a meal., Disp: 180 tablet, Rfl: 3   Cholecalciferol (VITAMIN D3) 2000 UNITS capsule, Take 2,000 Units by mouth 2 (two) times daily. , Disp: , Rfl:    diphenhydrAMINE HCl (ALLERGY MED PO), Take by mouth daily., Disp: , Rfl:    ELDERBERRY PO, Take by mouth., Disp: , Rfl:    furosemide (LASIX) 40 MG tablet, TAKE 1 TABLET BY MOUTH  DAILY, Disp: 90 tablet, Rfl: 1   hydrocortisone 1 % lotion, Apply 1 application topically 2 (two) times daily., Disp: 118 mL, Rfl: 0   Lancets Misc. KIT, Use patient & insurance preferred lancet device to test blood glucose up to 3 times daily for Dx: E11.69, Disp: 100 each, Rfl: 12   levothyroxine (EUTHYROX) 150 MCG tablet, Take 1 tablet (150 mcg total) by mouth daily., Disp: 90 tablet, Rfl: 3   losartan (COZAAR) 25 MG tablet, Take 1 tablet (25 mg total) by mouth daily., Disp: 90 tablet, Rfl: 3   naproxen (NAPROSYN) 500 MG tablet, Take 1 tablet (500 mg total) by mouth 2 (two) times daily., Disp: 30 tablet, Rfl: 0   Omega-3 Fatty Acids (FISH OIL TRIPLE STRENGTH) 1400 MG CAPS, Take 1,000 mg by mouth 4 (four) times daily. , Disp: , Rfl:    omeprazole (PRILOSEC) 20 MG capsule, Take 1 capsule (20 mg total) by  mouth 2 (two) times daily before a meal., Disp: 180 capsule, Rfl: 3   ONETOUCH VERIO test strip, USE 1 STRIP TO CHECK GLUCOSE UP TO THREE TIMES DAILY AS  INSTRUCTED, Disp: 100 each, Rfl: 9  Allergies  Allergen Reactions   Codeine Itching   Dilaudid [Hydromorphone] Other (See Comments)    Mouth blisters   Other Itching    Most pain meds cause itching.  When has to take pain medication, she has been instructed to take Benadryl   Hydrocodone-Acetaminophen Other (See Comments)   Doxycycline Rash and Other (See Comments)    Objective:   BP 139/79    Pulse 74    Temp (!) 97 F (36.1 C) (Temporal)     Ht _0  (1.626 m)    Wt 241 lb 12.8 oz (109.7 kg)    SpO2 95%    BMI 41.50 kg/m    Physical Exam Vitals reviewed.  Constitutional:      General: She is not in acute distress.    Appearance: Normal appearance. She is morbidly obese. She is not ill-appearing, toxic-appearing or diaphoretic.  HENT:     Head: Normocephalic and atraumatic.     Right Ear: Ear canal and external ear normal. There is impacted cerumen.     Left Ear: Tympanic membrane, ear canal and external ear normal. There is no impacted cerumen.     Nose: Congestion present.     Right Sinus: No maxillary sinus tenderness or frontal sinus tenderness.     Left Sinus: No maxillary sinus tenderness or frontal sinus tenderness.     Mouth/Throat:     Mouth: Mucous membranes are moist.     Pharynx: Oropharynx is clear. Posterior oropharyngeal erythema present. No oropharyngeal exudate.  Eyes:     General: No scleral icterus.       Right eye: No discharge.        Left eye: No discharge.     Conjunctiva/sclera: Conjunctivae normal.  Cardiovascular:     Rate and Rhythm: Normal rate and regular rhythm.     Heart sounds: Normal heart sounds. No murmur heard.   No friction rub. No gallop.  Pulmonary:     Effort: Pulmonary effort is normal. No respiratory distress.     Breath sounds: Normal breath sounds. No stridor. No wheezing, rhonchi or rales.  Musculoskeletal:        General: Normal range of motion.     Cervical back: Normal range of motion.  Lymphadenopathy:     Cervical: No cervical adenopathy.  Skin:    General: Skin is warm and dry.     Capillary Refill: Capillary refill takes less than 2 seconds.  Neurological:     General: No focal deficit present.     Mental Status: She is alert and oriented to person, place, and time. Mental status is at baseline.  Psychiatric:        Mood and Affect: Mood normal.        Behavior: Behavior normal.        Thought Content: Thought content normal.        Judgment: Judgment  normal.

## 2021-05-15 ENCOUNTER — Telehealth: Payer: Self-pay | Admitting: Family Medicine

## 2021-05-15 NOTE — Telephone Encounter (Signed)
Offered to schedule an appt for pt.  She would like to wait and will call back on Monday if no better.  She will add Mucinex to Azithromicin and start back taking the Perles. Feels the perles are not helping that much. Still coughing right much at night. Pt will increase fluid intake as well.  Cough is product, no fever.

## 2021-05-18 ENCOUNTER — Ambulatory Visit (INDEPENDENT_AMBULATORY_CARE_PROVIDER_SITE_OTHER): Payer: PPO

## 2021-05-18 ENCOUNTER — Encounter: Payer: Self-pay | Admitting: Nurse Practitioner

## 2021-05-18 ENCOUNTER — Ambulatory Visit (INDEPENDENT_AMBULATORY_CARE_PROVIDER_SITE_OTHER): Payer: PPO | Admitting: Nurse Practitioner

## 2021-05-18 VITALS — BP 134/82 | HR 86 | Temp 97.6°F | Resp 20 | Ht 64.0 in | Wt 235.0 lb

## 2021-05-18 DIAGNOSIS — J069 Acute upper respiratory infection, unspecified: Secondary | ICD-10-CM

## 2021-05-18 DIAGNOSIS — J986 Disorders of diaphragm: Secondary | ICD-10-CM | POA: Diagnosis not present

## 2021-05-18 MED ORDER — PSEUDOEPH-BROMPHEN-DM 30-2-10 MG/5ML PO SYRP: 2.5000 mL | ORAL_SOLUTION | Freq: Four times a day (QID) | ORAL | 0 refills | Status: DC | PRN

## 2021-05-18 NOTE — Patient Instructions (Signed)

## 2021-05-18 NOTE — Progress Notes (Signed)
Acute Office Visit  Subjective:    Patient ID: Karen Potter, female    DOB: [birth year], 81 y.o.   MRN: [id number]  Chief Complaint  Patient presents with   Cough    Cough This is a new problem. The current episode started yesterday. The problem has been unchanged. The problem occurs constantly. The cough is Productive of sputum. Pertinent negatives include no chest pain, chills, ear congestion, sore throat or shortness of breath. Nothing aggravates the symptoms. She has tried oral steroids and prescription cough suppressant for the symptoms.    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   age 81--  pt states was quarantined   no treatment ;  per pt no symptoms or issues since   History of kidney stones    History of TIA (transient ischemic attack)    03-23-2014  w/ episode temporay amnesia   Hyperlipidemia    Hypertension    Hypothyroidism    Iron deficiency anemia    LAFB (left anterior fascicular block)    OA (osteoarthritis)    OSA on CPAP    severe per study 2003   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   Post-polio limb muscle weakness    left leg  w/ 3 repair surgery's   RBBB (right bundle branch block)    Seasonal allergies    Type 2 diabetes mellitus ([place])    Vitamin D deficiency     Past Surgical History:  Procedure Laterality Date   CARPAL TUNNEL RELEASE Right 05-10-2007   CHOLECYSTECTOMY OPEN  1985   and Appendectomy   CYSTO/  RIGHT URETEROSCOPIC STONE EXTRACTON/  STENT PLACEMENT  06/ 2016   in [place]    CYSTOSCOPY/RETROGRADE/URETEROSCOPY/STONE EXTRACTION WITH BASKET Right 11/17/2015   Procedure: CYSTOSCOPY/RETROGRADE PYELOGRAM RIGHT/DIAGNOSTIC RIGHT URETEROSCOPY/LASER RENAL CALCIFICATION/RIGHT STENT PLACEMENT;  Surgeon: Cleon Gustin, MD;  Location: [facility];  Service: Urology;  Laterality: Right;   ESOPHAGOGASTRODUODENOSCOPY N/A 08/29/2015   Procedure: ESOPHAGOGASTRODUODENOSCOPY (EGD);  Surgeon: Danie Binder, MD;  [facility];  Service: Endoscopy;  Laterality: N/A;   HOLMIUM LASER APPLICATION Right 7/65/4650   Procedure: HOLMIUM LASER APPLICATION;  Surgeon: Cleon Gustin, MD;  Location: [facility];  Service: Urology;  Laterality: Right;   KNEE ARTHROSCOPY Right [place];  1985;  1995   polio   LUMBAR SPINE SURGERY  09-01-2010   L4 -L5 fusion,  L3 laminectomy,  L5 - S1 foraminotomy   OVARIAN CYST SURGERY Right 1966   PARATHYROIDECTOMY N/A 05/06/2015   Procedure: PARATHYROIDECTOMY;  Surgeon: Armandina Gemma, MD;  Location: [facility];  Service: General;  Laterality: N/A;   left superior   TONSILLECTOMY  as child   TOTAL ABDOMINAL HYSTERECTOMY W/ BILATERAL SALPINGOOPHORECTOMY  1984   TOTAL KNEE ARTHROPLASTY Right 04-08-2008   TRANSTHORACIC ECHOCARDIOGRAM  04-07-2012   grade 1 diastolic function,  ef 35-46%/  mild AV calcification without  stenosis/  trivial MR    Family History  Problem Relation Age of Onset   Heart failure Mother        No details.  No MI   Cancer Father        Lung   Cancer Brother        Lung   Stroke Maternal Grandfather    Cancer Paternal Grandmother        rectal   Cancer Paternal Grandfather        gastric    Social History   Socioeconomic History   Marital status: Married    Spouse name: Mickey   Number of children: 4   Years of education: 13-some college   Highest education level: High school graduate  Occupational History   Occupation: Retired  Tobacco Use   Smoking  status: Never   Smokeless tobacco: Never  Scientific laboratory technician Use: Never used  Substance and Sexual Activity   Alcohol use: No   Drug use: No   Sexual activity: Not Currently    Birth control/protection: Surgical  Other Topics Concern   Not on file  Social History Narrative   Lives with husband.  Four children.     Drinks 1-2 cokes per day, but usually drinks water   All children live fairly close. Son is closest, but works 12-13 hours/day.   Disabled daughter in [place] that she helps care for.   Another daughter lives within 15 minutes and takes her shopping and to out of town doctor visits.   Social Determinants of Health   Financial Resource Strain: Low Risk    Difficulty of Paying Living Expenses: Not hard at all  Food Insecurity: No Food Insecurity   Worried About Charity fundraiser in the Last Year: Never true   Divide in the Last Year: Never true  Transportation Needs: No Transportation Needs   Lack of Transportation (Medical): No   Lack of Transportation (Non-Medical): No  Physical Activity: Inactive   Days of Exercise per Week: 0 days   Minutes of Exercise per Session: 0 min  Stress: No Stress Concern Present   Feeling of Stress : Only a little  Social Connections: Engineer, building services of Communication with Friends and Family: More than three times a week   Frequency of Social Gatherings with Friends and Family: More than three times a week   Attends Religious Services: More than 4 times per year   Active Member of [employer] or Organizations: Yes   Attends Music therapist: More than 4 times per year   Marital Status: Married  Human resources officer Violence: Not At Risk   Fear of Current or Ex-Partner: No   Emotionally Abused: No   Physically Abused: No   Sexually Abused: No    Outpatient Medications Prior to Visit  Medication Sig Dispense Refill   acetaminophen (TYLENOL) 650 MG CR tablet Take 650 mg by mouth in the morning.      allopurinol (ZYLOPRIM) 100 MG tablet Take 1 tablet (100 mg total) by mouth daily. 90 tablet 3   aspirin 325 MG tablet Take 325 mg by mouth every evening.      Blood Glucose Monitoring Suppl (BLOOD GLUCOSE MONITOR SYSTEM) w/Device KIT Use glucose meter to check blood sugar up to 3 times daily as instructed. One Touch Verio preferred by insurance. Dx: EE11.69 1 each 0   carvedilol (COREG) 6.25 MG tablet Take 1 tablet (6.25 mg total) by  mouth 2 (two) times daily with a meal. 180 tablet 3   Cholecalciferol (VITAMIN D3) 2000 UNITS capsule Take 2,000 Units by mouth 2 (two) times daily.      diphenhydrAMINE HCl (ALLERGY MED PO) Take by mouth daily.     ELDERBERRY PO Take by mouth.     furosemide (LASIX) 40 MG tablet TAKE 1 TABLET BY MOUTH  DAILY 90 tablet 1   hydrocortisone 1 % lotion Apply 1 application topically 2 (two) times daily. 118 mL 0   Lancets Misc. KIT Use patient & insurance preferred lancet device to test blood glucose up to 3 times daily for Dx: E11.69 100 each 12   levothyroxine (EUTHYROX) 150 MCG tablet Take 1 tablet (150 mcg total) by mouth daily. 90 tablet 3   losartan (COZAAR) 25 MG tablet Take 1 tablet (25 mg total) by mouth daily. 90 tablet 3   naproxen (NAPROSYN) 500 MG tablet Take 1 tablet (500 mg total) by mouth 2 (two) times daily. 30 tablet 0   Omega-3 Fatty Acids (FISH OIL TRIPLE STRENGTH) 1400 MG CAPS Take 1,000 mg by mouth 4 (four) times daily.      omeprazole (PRILOSEC) 20 MG capsule Take 1 capsule (20 mg total) by mouth 2 (two) times daily before a meal. 180 capsule 3   ONETOUCH VERIO test strip USE 1 STRIP TO CHECK GLUCOSE UP TO THREE TIMES DAILY AS  INSTRUCTED 100 each 9   azithromycin (ZITHROMAX Z-PAK) 250 MG tablet Take 2 tablets (500 mg) PO today, then 1 tablet (250 mg) PO daily x4 days. 6 tablet 0   benzonatate (TESSALON PERLES) 100 MG capsule Take 1 capsule (100 mg total) by mouth 3 (three) times daily as needed for cough. 30 capsule 0   methylPREDNISolone (MEDROL  DOSEPAK) 4 MG TBPK tablet Use as directed. 21 each 0   No facility-administered medications prior to visit.    Allergies  Allergen Reactions   Codeine Itching   Dilaudid [Hydromorphone] Other (See Comments)    Mouth blisters   Other Itching    Most pain meds cause itching.  When has to take pain medication, she has been instructed to take Benadryl   Hydrocodone-Acetaminophen Other (See Comments)   Doxycycline Rash and Other (See Comments)    Review of Systems  Constitutional:  Negative for chills.  HENT:  Negative for sore throat.   Respiratory:  Positive for cough. Negative for shortness of breath.   Cardiovascular:  Negative for chest pain.  All other systems reviewed and are negative.     Objective:    Physical Exam Vitals and nursing note reviewed.  Constitutional:      Appearance: Normal appearance.  HENT:     Head: Normocephalic.     Right Ear: External ear normal.     Left Ear: External ear normal.     Nose: Nose normal.     Mouth/Throat:     Mouth: Mucous membranes are moist.     Pharynx: Oropharynx is clear.  Eyes:     Conjunctiva/sclera: Conjunctivae normal.  Cardiovascular:     Rate and Rhythm: Normal rate and regular rhythm.     Pulses: Normal pulses.     Heart sounds: Normal heart sounds.  Pulmonary:     Effort: Pulmonary effort is normal.     Breath sounds: Normal breath sounds.  Abdominal:     General: Bowel sounds are normal.  Skin:    General: Skin is warm.     Findings: No rash.  Neurological:     Mental Status: She is alert and oriented to person, place, and time.  Psychiatric:        Behavior: Behavior normal.    BP 134/82    Pulse 86    Temp 97.6 F (36.4 C) (Oral)    Resp 20    Ht _0  (1.626 m)    Wt 235 lb (106.6 kg)    SpO2 94%    BMI 40.34 kg/m  Wt Readings from Last 3 Encounters:  05/18/21 235 lb (106.6 kg)  05/12/21 241 lb 12.8 oz (109.7 kg)  03/05/21 242 lb 8 oz (110 kg)    Health Maintenance Due  Topic Date Due    Zoster Vaccines- Shingrix (1 of 2) Never done   DEXA SCAN  08/30/2019   TETANUS/TDAP  05/10/2020   COVID-19 Vaccine (5 - Booster for Moderna series) 11/18/2020    There are no preventive care reminders to display for this patient.   Lab Results  Component Value Date   TSH 0.904 09/03/2020   Lab Results  Component Value Date   WBC 6.2 09/03/2020   HGB 13.2 09/03/2020   HCT 38.8 09/03/2020   MCV 88 09/03/2020   PLT 250 09/03/2020   Lab Results  Component Value Date   NA 143 02/17/2021   K 3.8 02/17/2021   CO2 27 02/17/2021   GLUCOSE 152 (H) 02/17/2021   BUN 14 02/17/2021   CREATININE 0.83 02/17/2021   BILITOT 0.4 02/17/2021   ALKPHOS 83 02/17/2021   AST 21 02/17/2021   ALT 21 02/17/2021   PROT 6.8 02/17/2021   ALBUMIN 3.9 02/17/2021   CALCIUM 9.0 02/17/2021   ANIONGAP 8 08/16/2019   EGFR 72 02/17/2021   Lab Results  Component Value Date   CHOL 139 02/17/2021   Lab Results  Component Value Date   HDL 30 (L) 02/17/2021   Lab Results  Component Value Date   LDLCALC 72 02/17/2021   Lab Results  Component Value Date   TRIG 226 (H) 02/17/2021   Lab Results  Component Value Date   CHOLHDL 4.6 (H) 02/17/2021   Lab Results  Component Value Date   HGBA1C 7.2 (H) 02/17/2021       Assessment & Plan:  Cough symptoms not well controlled.  Educated patient that cough will take time to resolve.  Bromfed 2.5 ml for cough, and congestion.  Take meds as prescribed - Use a cool mist humidifier  -Use saline nose sprays frequently -Force fluids -For fever or aches or pains- take Tylenol or ibuprofen. -If symptoms do not improve, she may need to be COVID tested to rule this out Follow up with worsening unresolved symptoms  Rx sent to pharmacy.  Education provided to patient printed handouts given.   Problem List Items Addressed This Visit       Respiratory   Upper respiratory infection with cough and congestion - Primary   Relevant Medications    brompheniramine-pseudoephedrine-DM 30-2-10 MG/5ML syrup   Other Relevant Orders   DG Chest 2 View (Completed)     Meds ordered this encounter  Medications   brompheniramine-pseudoephedrine-DM 30-2-10 MG/5ML syrup    Sig: Take 2.5 mLs by mouth 4 (four) times daily as needed.    Dispense:  120 mL    Refill:  0    Order Specific Question:   Supervising Provider    Answer:   Claretta Fraise [[id number]]     Ivy Lynn, NP

## 2021-05-19 ENCOUNTER — Telehealth: Payer: Self-pay | Admitting: Family Medicine

## 2021-05-19 DIAGNOSIS — J069 Acute upper respiratory infection, unspecified: Secondary | ICD-10-CM

## 2021-05-19 NOTE — Telephone Encounter (Signed)
Advised pt her xray hasn't been reviewed by the provider but per the radiologist report no acute cardiopulmonary disease and pt voiced understanding.

## 2021-05-20 MED ORDER — PSEUDOEPH-BROMPHEN-DM 30-2-10 MG/5ML PO SYRP: 2.5000 mL | ORAL_SOLUTION | Freq: Four times a day (QID) | ORAL | 0 refills | Status: DC | PRN

## 2021-05-20 NOTE — Telephone Encounter (Signed)
LVM advising requested rx was resent to the [facility] in [place] and to call back with any further questions or concerns.

## 2021-05-21 ENCOUNTER — Ambulatory Visit (INDEPENDENT_AMBULATORY_CARE_PROVIDER_SITE_OTHER): Payer: PPO | Admitting: Family Medicine

## 2021-05-21 ENCOUNTER — Encounter: Payer: Self-pay | Admitting: Family Medicine

## 2021-05-21 VITALS — BP 120/67 | HR 55 | Ht 64.0 in | Wt 237.0 lb

## 2021-05-21 DIAGNOSIS — I152 Hypertension secondary to endocrine disorders: Secondary | ICD-10-CM | POA: Diagnosis not present

## 2021-05-21 DIAGNOSIS — E039 Hypothyroidism, unspecified: Secondary | ICD-10-CM | POA: Diagnosis not present

## 2021-05-21 DIAGNOSIS — J40 Bronchitis, not specified as acute or chronic: Secondary | ICD-10-CM

## 2021-05-21 DIAGNOSIS — E785 Hyperlipidemia, unspecified: Secondary | ICD-10-CM | POA: Diagnosis not present

## 2021-05-21 DIAGNOSIS — E1159 Type 2 diabetes mellitus with other circulatory complications: Secondary | ICD-10-CM | POA: Diagnosis not present

## 2021-05-21 DIAGNOSIS — E1169 Type 2 diabetes mellitus with other specified complication: Secondary | ICD-10-CM | POA: Diagnosis not present

## 2021-05-21 LAB — BMP8+EGFR
BUN/Creatinine Ratio: 16 (ref 12–28)
BUN: 14 mg/dL (ref 8–27)
CO2: 25 mmol/L (ref 20–29)
Calcium: 8.9 mg/dL (ref 8.7–10.3)
Chloride: 100 mmol/L (ref 96–106)
Creatinine, Ser: 0.88 mg/dL (ref 0.57–1.00)
Glucose: 135 mg/dL — ABNORMAL HIGH (ref 70–99)
Potassium: 3.7 mmol/L (ref 3.5–5.2)
Sodium: 141 mmol/L (ref 134–144)
eGFR: 67 mL/min/{1.73_m2} (ref >59)

## 2021-05-21 LAB — BAYER DCA HB A1C WAIVED: HB A1C (BAYER DCA - WAIVED): 7.3 % — ABNORMAL HIGH (ref 4.8–5.6)

## 2021-05-21 MED ORDER — AMOXICILLIN-POT CLAVULANATE 875-125 MG PO TABS: 1.0000 | ORAL_TABLET | Freq: Two times a day (BID) | ORAL | 0 refills | Status: DC

## 2021-05-21 MED ORDER — ALBUTEROL SULFATE HFA 108 (90 BASE) MCG/ACT IN AERS: 2.0000 | INHALATION_SPRAY | Freq: Four times a day (QID) | RESPIRATORY_TRACT | 0 refills | Status: DC | PRN

## 2021-05-21 NOTE — Progress Notes (Signed)
BP 120/67    Pulse (!) 55    Ht _0  (1.626 m)    Wt 237 lb (107.5 kg)    SpO2 94%    BMI 40.68 kg/m    Subjective:   Patient ID: Karen Potter, female    DOB: 1940-09-09, 81 y.o.   MRN: [id number]  HPI: Karen Potter is a 81 y.o. female presenting on 05/21/2021 for Medical Management of Chronic Issues, Diabetes, and Cough (X2 weeks, cough med not helping)   HPI Type 2 diabetes mellitus Patient comes in today for recheck of his diabetes. Patient has been currently taking diet controlled at this point, will check A1c. Patient has lost currently on an ACE inhibitor/ARB. Patient has seen an ophthalmologist this year. Patient denies any issues with their feet. The symptom started onset as an adult hypothyroidism and hyperlipidemia and hypertension ARE RELATED TO DM   Hypothyroidism recheck Patient is coming in for thyroid recheck today as well. They deny any issues with hair changes or heat or cold problems or diarrhea or constipation. They deny any chest pain or palpitations. They are currently on levothyroxine 150 micrograms   Hyperlipidemia Patient is coming in for recheck of his hyperlipidemia. The patient is currently taking fish oil. They deny any issues with myalgias or history of liver damage from it. They deny any focal numbness or weakness or chest pain.   Hypertension Patient is currently on carvedilol and furosemide and losartan, and their blood pressure today is 120/67. Patient denies any lightheadedness or dizziness. Patient denies headaches, blurred vision, chest pains, shortness of breath, or weakness. Denies any side effects from medication and is content with current medication.   Patient is coming in with cough and congestion and drainage and chest tightness that has been going on for the past 2 weeks.  She has taken a Z-Pak steroids and antibiotics in the hospital she had a cough medicine that does not seem to help and she feels like it is still keeping her up at night.   She is coughing up yellow-green sputum.  Relevant past medical, surgical, family and social history reviewed and updated as indicated. Interim medical history since our last visit reviewed. Allergies and medications reviewed and updated.  Review of Systems  Constitutional:  Negative for chills and fever.  Eyes:  Negative for visual disturbance.  Respiratory:  Negative for chest tightness and shortness of breath.   Cardiovascular:  Negative for chest pain and leg swelling.  Musculoskeletal:  Negative for back pain and gait problem.  Skin:  Negative for rash.  Neurological:  Negative for dizziness, light-headedness and headaches.  Psychiatric/Behavioral:  Negative for agitation and behavioral problems.   All other systems reviewed and are negative.  Per HPI unless specifically indicated above   Allergies as of 05/21/2021       Reactions   Codeine Itching   Dilaudid [hydromorphone] Other (See Comments)   Mouth blisters   Other Itching   Most pain meds cause itching.  When has to take pain medication, she has been instructed to take Benadryl   Hydrocodone-acetaminophen Other (See Comments)   Doxycycline Rash, Other (See Comments)        Medication List        Accurate as of May 21, 2021  8:30 AM. If you have any questions, ask your nurse or doctor.          acetaminophen 650 MG CR tablet Commonly known as: TYLENOL Take 650 mg by mouth  in the morning.   albuterol 108 (90 Base) MCG/ACT inhaler Commonly known as: VENTOLIN HFA Inhale 2 puffs into the lungs every 6 (six) hours as needed for wheezing or shortness of breath. Started by: Worthy Rancher, MD   ALLERGY MED PO Take by mouth daily.   allopurinol 100 MG tablet Commonly known as: ZYLOPRIM Take 1 tablet (100 mg total) by mouth daily.   amoxicillin-clavulanate 875-125 MG tablet Commonly known as: AUGMENTIN Take 1 tablet by mouth 2 (two) times daily. Started by: Worthy Rancher, MD   aspirin 325  MG tablet Take 325 mg by mouth every evening.   Blood Glucose Monitor System w/Device Kit Use glucose meter to check blood sugar up to 3 times daily as instructed. One Touch Verio preferred by insurance. Dx: EE11.69   brompheniramine-pseudoephedrine-DM 30-2-10 MG/5ML syrup Take 2.5 mLs by mouth 4 (four) times daily as needed.   carvedilol 6.25 MG tablet Commonly known as: COREG Take 1 tablet (6.25 mg total) by mouth 2 (two) times daily with a meal.   ELDERBERRY PO Take by mouth.   Fish Oil Triple Strength 1400 MG Caps Take 1,000 mg by mouth 4 (four) times daily.   furosemide 40 MG tablet Commonly known as: LASIX TAKE 1 TABLET BY MOUTH  DAILY   hydrocortisone 1 % lotion Apply 1 application topically 2 (two) times daily.   Lancets Misc. Kit Use patient & insurance preferred lancet device to test blood glucose up to 3 times daily for Dx: E11.69   levothyroxine 150 MCG tablet Commonly known as: Euthyrox Take 1 tablet (150 mcg total) by mouth daily.   losartan 25 MG tablet Commonly known as: COZAAR Take 1 tablet (25 mg total) by mouth daily.   naproxen 500 MG tablet Commonly known as: NAPROSYN Take 1 tablet (500 mg total) by mouth 2 (two) times daily.   omeprazole 20 MG capsule Commonly known as: PRILOSEC Take 1 capsule (20 mg total) by mouth 2 (two) times daily before a meal.   OneTouch Verio test strip Generic drug: glucose blood USE 1 STRIP TO CHECK GLUCOSE UP TO THREE TIMES DAILY AS  INSTRUCTED   Vitamin D3 50 MCG (2000 UT) capsule Take 2,000 Units by mouth 2 (two) times daily.         Objective:   BP 120/67    Pulse (!) 55    Ht _0  (1.626 m)    Wt 237 lb (107.5 kg)    SpO2 94%    BMI 40.68 kg/m   Wt Readings from Last 3 Encounters:  05/21/21 237 lb (107.5 kg)  05/18/21 235 lb (106.6 kg)  05/12/21 241 lb 12.8 oz (109.7 kg)    Physical Exam Vitals and nursing note reviewed.  Constitutional:      General: She is not in acute distress.     Appearance: She is well-developed. She is not diaphoretic.  HENT:     Right Ear: Tympanic membrane, ear canal and external ear normal.     Left Ear: Tympanic membrane, ear canal and external ear normal.     Nose: Mucosal edema and rhinorrhea present.     Right Sinus: No maxillary sinus tenderness or frontal sinus tenderness.     Left Sinus: No maxillary sinus tenderness or frontal sinus tenderness.     Mouth/Throat:     Pharynx: Uvula midline. Posterior oropharyngeal erythema present. No oropharyngeal exudate.     Tonsils: No tonsillar abscesses.  Eyes:     Conjunctiva/sclera: Conjunctivae normal.  Pupils: Pupils are equal, round, and reactive to light.  Cardiovascular:     Rate and Rhythm: Normal rate and regular rhythm.     Heart sounds: Normal heart sounds. No murmur heard. Pulmonary:     Effort: Pulmonary effort is normal. No respiratory distress.     Breath sounds: Normal breath sounds. No wheezing.  Musculoskeletal:        General: No tenderness. Normal range of motion.  Skin:    General: Skin is warm and dry.     Findings: No rash.  Neurological:     Mental Status: She is alert and oriented to person, place, and time.     Coordination: Coordination normal.  Psychiatric:        Behavior: Behavior normal.      Assessment & Plan:   Problem List Items Addressed This Visit       Cardiovascular and Mediastinum   Hypertension associated with diabetes ([place])     Endocrine   Hyperlipidemia associated with type 2 diabetes mellitus ([place])   Hypothyroid   Type 2 diabetes mellitus with other specified complication ([facility]) - Primary   Relevant Orders   Bayer DCA Hb A1c Waived   BMP8+EGFR   Other Visit Diagnoses     Bronchitis       Relevant Medications   amoxicillin-clavulanate (AUGMENTIN) 875-125 MG tablet   albuterol (VENTOLIN HFA) 108 (90 Base) MCG/ACT inhaler       Will treat with amoxicillin and albuterol to see if we can get her cough and congestion better.   A1c is pending. Follow up plan: Return in about 3 months (around 08/19/2021), or if symptoms worsen or fail to improve, for Diabetes and hypertension and thyroid.  Counseling provided for all of the vaccine components Orders Placed This Encounter  Procedures   Bayer [facility] Hb A1c Waived   [place], MD [facility] 05/21/2021, 8:30 AM

## 2021-05-29 ENCOUNTER — Telehealth: Payer: Self-pay | Admitting: Family Medicine

## 2021-05-29 DIAGNOSIS — E1169 Type 2 diabetes mellitus with other specified complication: Secondary | ICD-10-CM

## 2021-05-29 DIAGNOSIS — I152 Hypertension secondary to endocrine disorders: Secondary | ICD-10-CM

## 2021-05-29 DIAGNOSIS — E1159 Type 2 diabetes mellitus with other circulatory complications: Secondary | ICD-10-CM

## 2021-05-29 MED ORDER — CARVEDILOL 6.25 MG PO TABS: 6.2500 mg | ORAL_TABLET | Freq: Two times a day (BID) | ORAL | 1 refills | Status: DC

## 2021-05-29 MED ORDER — LOSARTAN POTASSIUM 25 MG PO TABS: 25.0000 mg | ORAL_TABLET | Freq: Every day | ORAL | 1 refills | Status: DC

## 2021-05-29 MED ORDER — OMEPRAZOLE 20 MG PO CPDR: 20.0000 mg | DELAYED_RELEASE_CAPSULE | Freq: Two times a day (BID) | ORAL | 1 refills | Status: DC

## 2021-05-29 MED ORDER — ALLOPURINOL 100 MG PO TABS: 100.0000 mg | ORAL_TABLET | Freq: Every day | ORAL | 1 refills | Status: DC

## 2021-05-29 MED ORDER — FUROSEMIDE 40 MG PO TABS: 40.0000 mg | ORAL_TABLET | Freq: Every day | ORAL | 1 refills | Status: DC

## 2021-05-29 MED ORDER — LEVOTHYROXINE SODIUM 150 MCG PO TABS: 150.0000 ug | ORAL_TABLET | Freq: Every day | ORAL | 2 refills | Status: DC

## 2021-05-29 NOTE — Telephone Encounter (Signed)
°  Prescription Request  05/29/2021  Is this a "Controlled Substance" medicine? no  Have you seen your PCP in the last 2 weeks? 05/21/21  If YES, route message to pool  -  If NO, patient needs to be scheduled for appointment.  What is the name of the medication or equipment? Allpurinol, carvedilol, furosemide,levothyroxine,losartin, omepazole  Have you contacted your pharmacy to request a refill? No pt is changing pharmacy   Which pharmacy would you like this sent to? [facility]   Patient notified that their request is being sent to the clinical staff for review and that they should receive a response within 2 business days.

## 2021-05-29 NOTE — Telephone Encounter (Signed)
Aware meds sent into [facility]

## 2021-06-11 ENCOUNTER — Ambulatory Visit: Payer: PPO | Admitting: Adult Health

## 2021-06-11 ENCOUNTER — Encounter: Payer: Self-pay | Admitting: Adult Health

## 2021-06-11 VITALS — BP 131/73 | HR 66 | Ht 64.0 in | Wt 243.0 lb

## 2021-06-11 DIAGNOSIS — Z9989 Dependence on other enabling machines and devices: Secondary | ICD-10-CM

## 2021-06-11 DIAGNOSIS — G4733 Obstructive sleep apnea (adult) (pediatric): Secondary | ICD-10-CM

## 2021-06-11 NOTE — Progress Notes (Signed)
PATIENT: Karen Potter DOB: 31-Aug-1940  REASON FOR VISIT: follow up HISTORY FROM: patient  HISTORY OF PRESENT ILLNESS: Today 06/11/21:  Karen Potter is a 81 year old female with a history of obstructive sleep apnea on CPAP.  She returns today for follow-up.  Her CPAP report is below.  She denies any new issues.  She was originally seen here for TIA episodes.  Reports that she has not had any events that she is aware of.    0/27/22Ms. Potter is a 81 year old female with a history of obstructive sleep apnea on CPAP.  She returns today for follow-up.  She reports that she is using machine nightly.  Denies any new issues.  Her download is below.  Compliance Report Usage 05/06/2020 - 06/04/2020 Usage days 30/30 days (100%) >= 4 hours 30 days (100%) Average usage (days used) 7 hours 40 minutes   AirSense 10 AutoSet Serial number [id number] Mode CPAP Set pressure 12 cmH2O EPR Fulltime EPR level 1  Therapy Leaks - L/min Median: 1.9 95th percentile: 12.4 Maximum: 25.4 Events per hour AI: 0.6 HI: 0.2 AHI: 0.8 Apnea Index Central: 0.1 Obstructive: 0.4 Unknown: 0.0  HISTORY 06/05/19:   Karen Potter is a 81 year old female with a history of obstructive sleep apnea on CPAP.  Her download indicates that she used her machine 30 out of 30 days for compliance of 100%.  She use her machine greater than 4 hours 29 days for compliance of 97%.  On average she uses her machine 7 hours and 16 minutes.  Her residual AHI is 0.7 on 12 cm of water with EPR of 1 her leak in the 95th percentile is 15.1 L/min.  She feels that the machine is working well for her.  She returns today for an evaluation  REVIEW OF SYSTEMS: Out of a complete 14 system review of symptoms, the patient complains only of the following symptoms, and all other reviewed systems are negative.   ESS 13  ALLERGIES: Allergies  Allergen Reactions   Codeine Itching   Dilaudid [Hydromorphone] Other (See Comments)    Mouth  blisters   Other Itching    Most pain meds cause itching.  When has to take pain medication, she has been instructed to take Benadryl   Hydrocodone-Acetaminophen Other (See Comments)   Doxycycline Rash and Other (See Comments)    HOME MEDICATIONS: Outpatient Medications Prior to Visit  Medication Sig Dispense Refill   acetaminophen (TYLENOL) 650 MG CR tablet Take 650 mg by mouth in the morning.     albuterol (VENTOLIN HFA) 108 (90 Base) MCG/ACT inhaler Inhale 2 puffs into the lungs every 6 (six) hours as needed for wheezing or shortness of breath. 8 g 0   allopurinol (ZYLOPRIM) 100 MG tablet Take 1 tablet (100 mg total) by mouth daily. 90 tablet 1   aspirin 325 MG tablet Take 325 mg by mouth every evening.      Blood Glucose Monitoring Suppl (BLOOD GLUCOSE MONITOR SYSTEM) w/Device KIT Use glucose meter to check blood sugar up to 3 times daily as instructed. One Touch Verio preferred by insurance. Dx: EE11.69 1 each 0   carvedilol (COREG) 6.25 MG tablet Take 1 tablet (6.25 mg total) by mouth 2 (two) times daily with a meal. 180 tablet 1   Cholecalciferol (VITAMIN D3) 2000 UNITS capsule Take 2,000 Units by mouth 2 (two) times daily.      diphenhydrAMINE HCl (ALLERGY MED PO) Take by mouth daily.     ELDERBERRY PO Take by  mouth.     furosemide (LASIX) 40 MG tablet Take 1 tablet (40 mg total) by mouth daily. 90 tablet 1   hydrocortisone 1 % lotion Apply 1 application topically 2 (two) times daily. 118 mL 0   Lancets Misc. KIT Use patient & insurance preferred lancet device to test blood glucose up to 3 times daily for Dx: E11.69 100 each 12   levothyroxine (EUTHYROX) 150 MCG tablet Take 1 tablet (150 mcg total) by mouth daily. 90 tablet 2   losartan (COZAAR) 25 MG tablet Take 1 tablet (25 mg total) by mouth daily. 90 tablet 1   Omega-3 Fatty Acids (FISH OIL TRIPLE STRENGTH) 1400 MG CAPS Take 1,000 mg by mouth 4 (four) times daily.      omeprazole (PRILOSEC) 20 MG capsule Take 1 capsule (20 mg  total) by mouth 2 (two) times daily before a meal. 180 capsule 1   ONETOUCH VERIO test strip USE 1 STRIP TO CHECK GLUCOSE UP TO THREE TIMES DAILY AS  INSTRUCTED 100 each 9   amoxicillin-clavulanate (AUGMENTIN) 875-125 MG tablet Take 1 tablet by mouth 2 (two) times daily. (Patient not taking: Reported on 06/11/2021) 20 tablet 0   brompheniramine-pseudoephedrine-DM 30-2-10 MG/5ML syrup Take 2.5 mLs by mouth 4 (four) times daily as needed. (Patient not taking: Reported on 06/11/2021) 120 mL 0   naproxen (NAPROSYN) 500 MG tablet Take 1 tablet (500 mg total) by mouth 2 (two) times daily. (Patient not taking: Reported on 06/11/2021) 30 tablet 0   No facility-administered medications prior to visit.    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   age 39--  pt states was quarantined   no treatment ;  per pt no symptoms or issues since   History of kidney stones    History of TIA (transient ischemic attack)    03-23-2014  w/ episode temporay amnesia   Hyperlipidemia    Hypertension    Hypothyroidism    Iron deficiency anemia    LAFB (left anterior fascicular block)    OA (osteoarthritis)    OSA on CPAP    severe per study 2003   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   Post-polio limb muscle weakness    left leg  w/ 3 repair surgery's   RBBB (right bundle branch block)    Seasonal allergies    Type 2 diabetes mellitus ([place])    Vitamin D deficiency     PAST SURGICAL HISTORY: Past Surgical History:  Procedure Laterality Date   CARPAL TUNNEL RELEASE  Right 05-10-2007   CHOLECYSTECTOMY OPEN  1985   and Appendectomy   CYSTO/  RIGHT URETEROSCOPIC STONE EXTRACTON/  STENT PLACEMENT  06/ 2016   in [place]   CYSTOSCOPY/RETROGRADE/URETEROSCOPY/STONE EXTRACTION WITH BASKET Right 11/17/2015   Procedure: CYSTOSCOPY/RETROGRADE PYELOGRAM RIGHT/DIAGNOSTIC RIGHT URETEROSCOPY/LASER RENAL CALCIFICATION/RIGHT STENT PLACEMENT;  Surgeon: Cleon Gustin, MD;  Location: [facility];  Service: Urology;  Laterality: Right;   ESOPHAGOGASTRODUODENOSCOPY  N/A 08/29/2015   Procedure: ESOPHAGOGASTRODUODENOSCOPY (EGD);  Surgeon: Danie Binder, MD;  Location: [facility];  Service: Endoscopy;  Laterality: N/A;   HOLMIUM LASER APPLICATION Right 9/48/5462   Procedure: HOLMIUM LASER APPLICATION;  Surgeon: Cleon Gustin, MD;  Location: [facility];  Service: Urology;  Laterality: Right;   KNEE ARTHROSCOPY Right [place];  1985;  1995   polio   LUMBAR SPINE SURGERY  09-01-2010   L4 -L5 fusion,  L3 laminectomy,  L5 - S1 foraminotomy   OVARIAN CYST SURGERY Right 1966   PARATHYROIDECTOMY N/A 05/06/2015   Procedure: PARATHYROIDECTOMY;  Surgeon: Armandina Gemma, MD;  Location: [facility];  Service: General;  Laterality: N/A;   left superior   TONSILLECTOMY  as child   TOTAL ABDOMINAL HYSTERECTOMY W/ BILATERAL SALPINGOOPHORECTOMY  1984   TOTAL KNEE ARTHROPLASTY Right 04-08-2008   TRANSTHORACIC ECHOCARDIOGRAM  04-07-2012   grade 1 diastolic function,  ef 70-35%/  mild AV calcification without stenosis/  trivial MR    FAMILY HISTORY: Family History  Problem Relation Age of Onset   Heart failure Mother        No details.  No MI   Cancer Father        Lung   Cancer Brother        Lung   Stroke Maternal Grandfather    Cancer Paternal Grandmother        rectal   Cancer Paternal Grandfather        gastric    SOCIAL HISTORY: Social History   Socioeconomic History   Marital status: Married    Spouse Potter:  Mickey   Number of children: 4   Years of education: 13-some college   Highest education level: High school graduate  Occupational History   Occupation: Retired  Tobacco Use   Smoking status: Never   Smokeless tobacco: Never  Scientific laboratory technician Use: Never used  Substance and Sexual Activity   Alcohol use: No   Drug use: No   Sexual activity: Not Currently    Birth control/protection: Surgical  Other Topics Concern   Not on file  Social History Narrative   Lives with husband.  Four children.     Drinks 1-2 cokes per day at least, but usually drinks water   All children live fairly close. Son is closest, but works 12-13 hours/day.   Disabled daughter in [place] that she helps care for.   Another daughter lives within 15 minutes and takes her shopping and to out of town doctor visits.   Social Determinants of Health   Financial Resource Strain: Low Risk    Difficulty of Paying Living Expenses: Not hard at all  Food Insecurity: No Food Insecurity   Worried About Charity fundraiser in the Last Year: Never true   [place] in the Last Year: Never true  Transportation Needs: No Transportation Needs   Lack of Transportation (Medical): No   Lack of Transportation (Non-Medical): No  Physical Activity: Inactive   Days of Exercise per Week: 0 days   Minutes of Exercise per Session: 0 min  Stress: No Stress Concern Present   Feeling of Stress : Only a little  Social Connections: Engineer, building services of Communication with Friends and Family: More than three times a week   Frequency of Social Gatherings with Friends and Family: More than three times a week   Attends Religious Services: More  than 4 times per year   Active Member of Clubs or Organizations: Yes   Attends Music therapist: More than 4 times per year   Marital Status: Married  Human resources officer Violence: Not At Risk   Fear of Current or Ex-Partner: No   Emotionally Abused: No   Physically  Abused: No   Sexually Abused: No      PHYSICAL EXAM  Vitals:   06/11/21 1424  BP: 131/73  Pulse: 66  Weight: 243 lb (110.2 kg)  Height: '5\' 4"'  (1.626 m)   Body mass index is 41.71 kg/m.  Generalized: Well developed, in no acute distress  Chest: Lungs clear to auscultation bilaterally  Neurological examination  Mentation: Alert oriented to time, place, history taking. Follows all commands speech and language fluent Cranial nerve II-XII: Extraocular movements were full, visual field were full on confrontational test Head turning and shoulder shrug  were normal and symmetric. Motor: The motor testing reveals 5 over 5 strength of all 4 extremities. Good symmetric motor tone is noted throughout. d.  Gait and station: Gait is normal.    DIAGNOSTIC DATA (LABS, IMAGING, TESTING) - I reviewed patient records, labs, notes, testing and imaging myself where available.  Lab Results  Component Value Date   WBC 6.2 09/03/2020   HGB 13.2 09/03/2020   HCT 38.8 09/03/2020   MCV 88 09/03/2020   PLT 250 09/03/2020      Component Value Date/Time   NA 141 05/21/2021 0900   K 3.7 05/21/2021 0900   CL 100 05/21/2021 0900   CO2 25 05/21/2021 0900   GLUCOSE 135 (H) 05/21/2021 0900   GLUCOSE 152 (H) 08/16/2019 2354   BUN 14 05/21/2021 0900   CREATININE 0.88 05/21/2021 0900   CREATININE 0.73 10/03/2012 1310   CALCIUM 8.9 05/21/2021 0900   PROT 6.8 02/17/2021 0906   ALBUMIN 3.9 02/17/2021 0906   AST 21 02/17/2021 0906   ALT 21 02/17/2021 0906   ALKPHOS 83 02/17/2021 0906   BILITOT 0.4 02/17/2021 0906   GFRNONAA 54 (L) 03/24/2020 0946   GFRNONAA 84 10/03/2012 1310   GFRAA 62 03/24/2020 0946   GFRAA >89 10/03/2012 1310   Lab Results  Component Value Date   CHOL 139 02/17/2021   HDL 30 (L) 02/17/2021   LDLCALC 72 02/17/2021   TRIG 226 (H) 02/17/2021   CHOLHDL 4.6 (H) 02/17/2021   Lab Results  Component Value Date   HGBA1C 7.3 (H) 05/21/2021   Lab Results  Component Value  Date   VITAMINB12 262 08/28/2015   Lab Results  Component Value Date   TSH 0.904 09/03/2020      ASSESSMENT AND PLAN 81 y.o. year old female  has a past medical history of Duodenal ulcer without hemorrhage or perforation, Fatty liver, Gastric ulcer without hemorrhage or perforation, Gastritis, GERD (gastroesophageal reflux disease), Hearing loss, History of benign parathyroid tumor, History of hepatitis B (?B), History of kidney stones, History of TIA (transient ischemic attack), Hyperlipidemia, Hypertension, Hypothyroidism, Iron deficiency anemia, LAFB (left anterior fascicular block), OA (osteoarthritis), OSA on CPAP, Polio, Post-polio limb muscle weakness, RBBB (right bundle branch block), Seasonal allergies, Type 2 diabetes mellitus ([place]), and Vitamin D deficiency. here with:  OSA on CPAP  - CPAP compliance excellent - Good treatment of AHI  - Encourage patient to use CPAP nightly and > 4 hours each night - F/U in 1 year or sooner if needed     Ward Givens, MSN, NP-C 06/11/2021, 2:29 PM [facility]  [address] [place], [place] [address] [phone]

## 2021-06-11 NOTE — Patient Instructions (Signed)
Continue using CPAP nightly and greater than 4 hours each night °If your symptoms worsen or you develop new symptoms please let us know.  ° °

## 2021-06-14 ENCOUNTER — Encounter (HOSPITAL_COMMUNITY): Payer: Self-pay

## 2021-06-14 ENCOUNTER — Emergency Department (HOSPITAL_COMMUNITY): Payer: PPO

## 2021-06-14 ENCOUNTER — Observation Stay (HOSPITAL_COMMUNITY)
Admission: EM | Admit: 2021-06-14 | Discharge: 2021-06-15 | Disposition: A | Discharge status: Home / Self Care | Payer: PPO | Attending: Family Medicine | Admitting: Family Medicine

## 2021-06-14 ENCOUNTER — Other Ambulatory Visit: Payer: Self-pay

## 2021-06-14 DIAGNOSIS — Z7982 Long term (current) use of aspirin: Secondary | ICD-10-CM | POA: Insufficient documentation

## 2021-06-14 DIAGNOSIS — B9620 Unspecified Escherichia coli [E. coli] as the cause of diseases classified elsewhere: Secondary | ICD-10-CM | POA: Diagnosis not present

## 2021-06-14 DIAGNOSIS — Z96651 Presence of right artificial knee joint: Secondary | ICD-10-CM | POA: Insufficient documentation

## 2021-06-14 DIAGNOSIS — R4701 Aphasia: Secondary | ICD-10-CM | POA: Diagnosis not present

## 2021-06-14 DIAGNOSIS — Z86018 Personal history of other benign neoplasm: Secondary | ICD-10-CM | POA: Insufficient documentation

## 2021-06-14 DIAGNOSIS — G459 Transient cerebral ischemic attack, unspecified: Principal | ICD-10-CM | POA: Insufficient documentation

## 2021-06-14 DIAGNOSIS — R410 Disorientation, unspecified: Secondary | ICD-10-CM

## 2021-06-14 DIAGNOSIS — E1159 Type 2 diabetes mellitus with other circulatory complications: Secondary | ICD-10-CM | POA: Diagnosis not present

## 2021-06-14 DIAGNOSIS — I10 Essential (primary) hypertension: Secondary | ICD-10-CM | POA: Insufficient documentation

## 2021-06-14 DIAGNOSIS — E039 Hypothyroidism, unspecified: Secondary | ICD-10-CM | POA: Diagnosis not present

## 2021-06-14 DIAGNOSIS — E119 Type 2 diabetes mellitus without complications: Secondary | ICD-10-CM | POA: Insufficient documentation

## 2021-06-14 DIAGNOSIS — G4733 Obstructive sleep apnea (adult) (pediatric): Secondary | ICD-10-CM | POA: Diagnosis not present

## 2021-06-14 DIAGNOSIS — Z20822 Contact with and (suspected) exposure to covid-19: Secondary | ICD-10-CM | POA: Diagnosis not present

## 2021-06-14 DIAGNOSIS — R4789 Other speech disturbances: Secondary | ICD-10-CM

## 2021-06-14 DIAGNOSIS — N3000 Acute cystitis without hematuria: Secondary | ICD-10-CM | POA: Diagnosis not present

## 2021-06-14 DIAGNOSIS — Z8673 Personal history of transient ischemic attack (TIA), and cerebral infarction without residual deficits: Secondary | ICD-10-CM

## 2021-06-14 DIAGNOSIS — N390 Urinary tract infection, site not specified: Secondary | ICD-10-CM | POA: Diagnosis present

## 2021-06-14 DIAGNOSIS — Z79899 Other long term (current) drug therapy: Secondary | ICD-10-CM | POA: Insufficient documentation

## 2021-06-14 DIAGNOSIS — I152 Hypertension secondary to endocrine disorders: Secondary | ICD-10-CM | POA: Diagnosis not present

## 2021-06-14 LAB — CBC WITH DIFFERENTIAL/PLATELET
Abs Immature Granulocytes: 0.01 10*3/uL (ref 0.00–0.07)
Basophils Absolute: 0.1 10*3/uL (ref 0.0–0.1)
Basophils Relative: 1 %
Eosinophils Absolute: 0.3 10*3/uL (ref 0.0–0.5)
Eosinophils Relative: 4 %
HCT: 42.2 % (ref 36.0–46.0)
Hemoglobin: 13.8 g/dL (ref 12.0–15.0)
Immature Granulocytes: 0 %
Lymphocytes Relative: 39 %
Lymphs Abs: 2.7 10*3/uL (ref 0.7–4.0)
MCH: 30.9 pg (ref 26.0–34.0)
MCHC: 32.7 g/dL (ref 30.0–36.0)
MCV: 94.4 fL (ref 80.0–100.0)
Monocytes Absolute: 0.9 10*3/uL (ref 0.1–1.0)
Monocytes Relative: 13 %
Neutro Abs: 3.1 10*3/uL (ref 1.7–7.7)
Neutrophils Relative %: 43 %
Platelets: 273 10*3/uL (ref 150–400)
RBC: 4.47 MIL/uL (ref 3.87–5.11)
RDW: 14.6 % (ref 11.5–15.5)
WBC: 7 10*3/uL (ref 4.0–10.5)
nRBC: 0 % (ref 0.0–0.2)

## 2021-06-14 LAB — URINALYSIS, ROUTINE W REFLEX MICROSCOPIC
Bilirubin Urine: NEGATIVE
Glucose, UA: NEGATIVE mg/dL
Ketones, ur: NEGATIVE mg/dL
Nitrite: NEGATIVE
Protein, ur: NEGATIVE mg/dL
Specific Gravity, Urine: <1.005 — ABNORMAL LOW (ref 1.005–1.030)
pH: 6 (ref 5.0–8.0)

## 2021-06-14 LAB — COMPREHENSIVE METABOLIC PANEL
ALT: 29 U/L (ref 0–44)
AST: 28 U/L (ref 15–41)
Albumin: 3.6 g/dL (ref 3.5–5.0)
Alkaline Phosphatase: 88 U/L (ref 38–126)
Anion gap: 11 (ref 5–15)
BUN: 12 mg/dL (ref 8–23)
CO2: 29 mmol/L (ref 22–32)
Calcium: 8.8 mg/dL — ABNORMAL LOW (ref 8.9–10.3)
Chloride: 98 mmol/L (ref 98–111)
Creatinine, Ser: 0.9 mg/dL (ref 0.44–1.00)
GFR, Estimated: >60 mL/min (ref >60)
Glucose, Bld: 185 mg/dL — ABNORMAL HIGH (ref 70–99)
Potassium: 3.3 mmol/L — ABNORMAL LOW (ref 3.5–5.1)
Sodium: 138 mmol/L (ref 135–145)
Total Bilirubin: 0.6 mg/dL (ref 0.3–1.2)
Total Protein: 7.7 g/dL (ref 6.5–8.1)

## 2021-06-14 LAB — URINALYSIS, MICROSCOPIC (REFLEX)

## 2021-06-14 LAB — ETHANOL: Alcohol, Ethyl (B): <10 mg/dL (ref <10)

## 2021-06-14 LAB — RESP PANEL BY RT-PCR (FLU A&B, COVID) ARPGX2
Influenza A by PCR: NEGATIVE
Influenza B by PCR: NEGATIVE
SARS Coronavirus 2 by RT PCR: NEGATIVE

## 2021-06-14 LAB — PROTIME-INR
INR: 1 (ref 0.8–1.2)
Prothrombin Time: 13.4 seconds (ref 11.4–15.2)

## 2021-06-14 LAB — CBG MONITORING, ED: Glucose-Capillary: 200 mg/dL — ABNORMAL HIGH (ref 70–99)

## 2021-06-14 MED ORDER — LORAZEPAM 2 MG/ML IJ SOLN
0.5000 mg | Freq: Once | INTRAMUSCULAR | Status: AC
  Administered 2021-06-15: 0.5 mg via INTRAVENOUS
  Filled 2021-06-14: qty 1

## 2021-06-14 MED ORDER — ACETAMINOPHEN 160 MG/5ML PO SOLN: 650.0000 mg | ORAL | Status: DC | PRN

## 2021-06-14 MED ORDER — ALLOPURINOL 100 MG PO TABS
100.0000 mg | ORAL_TABLET | Freq: Every day | ORAL | Status: DC
  Administered 2021-06-15: 100 mg via ORAL
  Filled 2021-06-14: qty 1

## 2021-06-14 MED ORDER — ACETAMINOPHEN 325 MG PO TABS: 650.0000 mg | ORAL_TABLET | ORAL | Status: DC | PRN

## 2021-06-14 MED ORDER — ACETAMINOPHEN 650 MG RE SUPP: 650.0000 mg | RECTAL | Status: DC | PRN

## 2021-06-14 MED ORDER — SODIUM CHLORIDE 0.9 % IV SOLN: 2.0000 g | INTRAVENOUS | Status: DC

## 2021-06-14 MED ORDER — SODIUM CHLORIDE 0.9 % IV SOLN
1.0000 g | Freq: Once | INTRAVENOUS | Status: AC
  Administered 2021-06-14: 1 g via INTRAVENOUS
  Filled 2021-06-14: qty 10

## 2021-06-14 MED ORDER — TRAMADOL HCL 50 MG PO TABS: 50.0000 mg | ORAL_TABLET | Freq: Once | ORAL | Status: DC | PRN

## 2021-06-14 MED ORDER — ENOXAPARIN SODIUM 40 MG/0.4ML IJ SOSY
40.0000 mg | PREFILLED_SYRINGE | INTRAMUSCULAR | Status: DC
  Administered 2021-06-15: 40 mg via SUBCUTANEOUS
  Filled 2021-06-14: qty 0.4

## 2021-06-14 MED ORDER — SENNOSIDES-DOCUSATE SODIUM 8.6-50 MG PO TABS: 1.0000 | ORAL_TABLET | Freq: Every evening | ORAL | Status: DC | PRN

## 2021-06-14 MED ORDER — STROKE: EARLY STAGES OF RECOVERY BOOK: Freq: Once | Status: DC

## 2021-06-14 MED ORDER — CLOPIDOGREL BISULFATE 75 MG PO TABS
75.0000 mg | ORAL_TABLET | Freq: Once | ORAL | Status: AC
Start: 2021-06-14 — End: 2021-06-14
  Administered 2021-06-14: 75 mg via ORAL
  Filled 2021-06-14: qty 1

## 2021-06-14 MED ORDER — LEVOTHYROXINE SODIUM 75 MCG PO TABS
150.0000 ug | ORAL_TABLET | Freq: Every day | ORAL | Status: DC
  Administered 2021-06-15: 150 ug via ORAL
  Filled 2021-06-14: qty 2

## 2021-06-14 MED ORDER — ASPIRIN 81 MG PO CHEW
324.0000 mg | CHEWABLE_TABLET | Freq: Once | ORAL | Status: AC
Start: 2021-06-14 — End: 2021-06-14
  Administered 2021-06-14: 324 mg via ORAL
  Filled 2021-06-14: qty 4

## 2021-06-14 MED ORDER — CLOPIDOGREL BISULFATE 75 MG PO TABS: 75.0000 mg | ORAL_TABLET | Freq: Every evening | ORAL | Status: DC

## 2021-06-14 MED ORDER — SODIUM CHLORIDE 0.9 % IV SOLN: 1.0000 g | Freq: Once | INTRAVENOUS | Status: DC

## 2021-06-14 MED ORDER — HYDRALAZINE HCL 20 MG/ML IJ SOLN: 10.0000 mg | INTRAMUSCULAR | Status: DC | PRN

## 2021-06-14 NOTE — ED Provider Notes (Signed)
Change of shift, care signed out from off going physician Dr. Doren Custard.  Patient is well-appearing at this time, symptoms have resolved but had definite symptoms consistent with TIA, neurology recommends admission with treatment for TIA, aspirin and Plavix ordered, discussed with Dr. Denton Brick who will admit   Noemi Chapel, MD 06/14/21 [phone]

## 2021-06-14 NOTE — ED Notes (Signed)
Assumed patient care, waiting for admission, no complaints at this time.

## 2021-06-14 NOTE — ED Notes (Signed)
Report given to Bre, RN.

## 2021-06-14 NOTE — ED Provider Notes (Signed)
[facility] EMERGENCY DEPARTMENT Provider Note   CSN: [id number] Arrival date & time: 06/14/21  1326     History  Chief Complaint  Patient presents with   Aphasia    Karen Potter is a 81 y.o. female.  HPI Patient presents for confusion and word finding difficulty.  Onset of symptoms was approximately 11:30 AM.  At the time, she was at Sunday school.  She was witnessed by family as acting normally prior to onset of symptoms.  Since onset, patient symptoms have resolved but she continues to endorse fatigue.  Patient does not have a history of CVA.  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  Most recently, this was over 10 years ago.  She does take a 325 mg dose of aspirin daily.  She does not take Plavix and does not take anticoagulation.  Patient reports that she was in her normal state of health prior to the symptom onset today.  She has had recent frequency but denies dysuria.    Home Medications Prior to Admission medications   Medication Sig Start Date End Date Taking? Authorizing Provider  acetaminophen (TYLENOL) 650 MG CR tablet Take 650 mg by mouth in the morning.    [provider]  albuterol (VENTOLIN HFA) 108 (90 Base) MCG/ACT inhaler Inhale 2 puffs into the lungs every 6 (six) hours as needed for wheezing or shortness of breath. 05/21/21   Dettinger, Fransisca Kaufmann, MD  allopurinol (ZYLOPRIM) 100 MG tablet Take 1 tablet (100 mg total) by mouth daily. 05/29/21   Dettinger, Fransisca Kaufmann, MD  amoxicillin-clavulanate (AUGMENTIN) 875-125 MG tablet Take 1 tablet by mouth 2 (two) times daily. Patient not taking: Reported on 06/11/2021 05/21/21   Dettinger, Fransisca Kaufmann, MD  aspirin 325 MG tablet Take 325 mg by mouth every evening.     [provider]  Blood Glucose Monitoring Suppl (BLOOD GLUCOSE MONITOR SYSTEM) w/Device KIT Use glucose meter to check blood sugar up to 3 times daily as instructed. One Touch Verio preferred by insurance. 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 Patient not taking: Reported on 06/11/2021 05/20/21   Ivy Lynn, NP  carvedilol (COREG) 6.25 MG tablet Take 1 tablet (6.25 mg total) by mouth 2 (two) times daily with a meal. 05/29/21   Dettinger, Fransisca Kaufmann, MD  Cholecalciferol (VITAMIN D3) 2000 UNITS capsule Take 2,000 Units by mouth 2 (two) times daily.     [provider]  diphenhydrAMINE HCl (ALLERGY MED PO) Take by mouth daily.    [provider]  ELDERBERRY PO Take by mouth.    [provider]  furosemide (LASIX) 40 MG tablet Take 1 tablet (40 mg total) by mouth daily. 05/29/21   Dettinger, Fransisca Kaufmann, MD  hydrocortisone 1 % lotion Apply 1 application topically 2 (two) times daily. 11/14/20   Ivy Lynn, NP  Lancets Misc. 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 05/29/21   Dettinger, Fransisca Kaufmann, MD  losartan (COZAAR) 25 MG tablet Take 1 tablet (25 mg total) by mouth daily. 05/29/21   Dettinger, Fransisca Kaufmann, MD  naproxen (NAPROSYN) 500 MG tablet Take 1 tablet (500 mg total) by mouth 2 (two) times  daily. Patient not taking: Reported on 06/11/2021 11/23/20   Truddie Hidden, MD  Omega-3 Fatty Acids (FISH OIL TRIPLE STRENGTH) 1400 MG CAPS Take 1,000 mg by mouth 4 (four) times daily.     [provider]  omeprazole (PRILOSEC) 20 MG capsule Take 1 capsule (20 mg total) by mouth 2 (two) times daily before a meal. 05/29/21   Dettinger, Fransisca Kaufmann, MD  ONETOUCH VERIO test strip USE 1 STRIP TO CHECK GLUCOSE UP TO THREE TIMES DAILY AS  INSTRUCTED 05/06/21   Dettinger, Fransisca Kaufmann, MD      Allergies    Codeine, Dilaudid [hydromorphone], Other, Hydrocodone-acetaminophen, and Doxycycline    Review of Systems   Review of Systems   Genitourinary:  Positive for frequency.  Neurological:  Positive for speech difficulty.  Psychiatric/Behavioral:  Positive for confusion.   All other systems reviewed and are negative.  Physical Exam Updated Vital Signs BP 119/78    Pulse 71    Temp 98.4 F (36.9 C) (Oral)    Resp (!) 24    Ht 5' 4.5" (1.638 m)    Wt 106.6 kg    SpO2 96%    BMI 39.71 kg/m  Physical Exam Vitals and nursing note reviewed.  Constitutional:      General: She is not in acute distress.    Appearance: Normal appearance. She is well-developed. She is not ill-appearing, toxic-appearing or diaphoretic.  HENT:     Head: Normocephalic and atraumatic.     Right Ear: External ear normal.     Left Ear: External ear normal.     Nose: Nose normal.     Mouth/Throat:     Mouth: Mucous membranes are moist.     Pharynx: Oropharynx is clear.  Eyes:     General: No visual field deficit or scleral icterus.    Conjunctiva/sclera: Conjunctivae normal.  Cardiovascular:     Rate and Rhythm: Normal rate and regular rhythm.     Heart sounds: No murmur heard. Pulmonary:     Effort: Pulmonary effort is normal. No respiratory distress.     Breath sounds: Normal breath sounds. No wheezing or rales.  Abdominal:     Palpations: Abdomen is soft.     Tenderness: There is no abdominal tenderness.  Musculoskeletal:        General: No swelling.     Cervical back: Normal range of motion and neck supple. No rigidity.     Right lower leg: No edema.     Left lower leg: No edema.  Skin:    General: Skin is warm and dry.     Capillary Refill: Capillary refill takes less than 2 seconds.     Coloration: Skin is not jaundiced or pale.  Neurological:     General: No focal deficit present.     Mental Status: She is alert and oriented to person, place, and time.     Cranial Nerves: Cranial nerves 2-12 are intact. No cranial nerve deficit, dysarthria or facial asymmetry.     Sensory: Sensation is intact. No sensory deficit.     Motor:  Motor function is intact. No weakness, abnormal muscle tone or pronator drift.     Coordination: Coordination is intact. Finger-Nose-Finger Test normal.  Psychiatric:        Mood and Affect: Mood normal.    ED Results / Procedures / Treatments   Labs (all labs ordered are listed, but only abnormal results are displayed) Labs Reviewed  COMPREHENSIVE METABOLIC PANEL - Abnormal; Notable  for the following components:      Result Value   Potassium 3.3 (*)    Glucose, Bld 185 (*)    Calcium 8.8 (*)    All other components within normal limits  URINALYSIS, ROUTINE W REFLEX MICROSCOPIC - Abnormal; Notable for the following components:   Specific Gravity, Urine <1.005 (*)    Hgb urine dipstick TRACE (*)    Leukocytes,Ua SMALL (*)    All other components within normal limits  URINALYSIS, MICROSCOPIC (REFLEX) - Abnormal; Notable for the following components:   Bacteria, UA MANY (*)    All other components within normal limits  CBG MONITORING, ED - Abnormal; Notable for the following components:   Glucose-Capillary 200 (*)    All other components within normal limits  URINE CULTURE  PROTIME-INR  CBC WITH DIFFERENTIAL/PLATELET  ETHANOL    EKG EKG Interpretation  Date/Time:  Sunday June 14 2021 13:34:54 EST Ventricular Rate:  81 PR Interval:  152 QRS Duration: 144 QT Interval:  408 QTC Calculation: 474 R Axis:   -72 Text Interpretation: Sinus rhythm 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  Confusion.  Difficulty speaking. EXAM: CT HEAD WITHOUT CONTRAST TECHNIQUE: Contiguous axial images were obtained from the base of the skull through the vertex without intravenous contrast. RADIATION DOSE REDUCTION: This exam was performed according to the departmental dose-optimization program which  includes automated exposure control, adjustment of the mA and/or kV according to patient size and/or use of iterative reconstruction technique. COMPARISON:  April 08, 2012 FINDINGS: Brain: No evidence of acute infarction, hemorrhage, hydrocephalus, extra-axial collection or mass lesion/mass effect. Vascular: No hyperdense vessel or unexpected calcification. Skull: Normal. Negative for fracture or focal lesion. Sinuses/Orbits: No acute finding. Other: None. IMPRESSION: No acute intracranial abnormalities. Electronically Signed   By: Dorise Bullion III M.D.   On: 06/14/2021 15:17    Procedures Procedures    Medications Ordered in ED Medications  cefTRIAXone (ROCEPHIN) 1 g in sodium chloride 0.9 % 100 mL IVPB (1 g Intravenous New Bag/Given 06/14/21 1638)  aspirin chewable tablet 324 mg (324 mg Oral Given 06/14/21 1704)  clopidogrel (PLAVIX) tablet 75 mg (75 mg Oral Given 06/14/21 1704)    ED Course/ Medical Decision Making/ A&P                           Medical Decision Making Amount and/or Complexity of Data Reviewed Labs: ordered. Radiology: ordered.   This patient presents to the ED for concern of confusion and word finding difficulty, this involves an extensive number of treatment options, and is a complaint that carries with it a high risk of complications and morbidity.  The differential diagnosis includes CVA, TIA, infection, hypoglycemia, polypharmacy   Co morbidities that complicate the patient evaluation  GERD, OSA, HLD, TIA, HTN, DM, hypothyroidism   Additional history obtained:  Additional history obtained from patient's daughter, patient's husband External records from outside source obtained and reviewed including EMR   Lab Tests:  I Ordered, and personally interpreted labs.  The pertinent results include: Slight bacteria and pyuria, slight hypokalemia but otherwise normal electrolytes, mild hyperglycemia, normal hemoglobin, no leukocytosis   Imaging Studies  ordered:  I ordered imaging studies including CT head I independently visualized and interpreted imaging which showed no acute findings I agree  with the radiologist interpretation   Cardiac Monitoring:  The patient was maintained on a cardiac monitor.  I personally viewed and interpreted the cardiac monitored which showed an underlying rhythm of: Sinus rhythm   Medicines ordered and prescription drug management:  I ordered medication including ceftriaxone for UTI Reevaluation of the patient after these medicines showed that the patient stayed the same I have reviewed the patients home medicines and have made adjustments as needed   Test Considered:  Further TIA work-up, deferred to inpatient team   Critical Interventions:  Neurologic assessment upon arrival, consultation with neurology   Consultations Obtained:  I requested consultation with the neurologist,  and discussed lab and imaging findings as well as pertinent plan - they recommend: Admission for TIA work-up   Problem List / ED Course:  Patient is a pleasant 81 year old female who presents for acute onset of confusion and word finding difficulty prior to arrival.  Since onset, symptoms have resolved.  On arrival, she is well-appearing.  She endorses fatigue only.  She has no focal neurologic deficits.  Laboratory work-up and noncontrasted CT scan of head were ordered.  Results of work-up were notable only for mild pyuria and bacteruria.  On further questioning, patient has had recent frequency.  Patient to be treated for UTI.  Ceftriaxone was ordered.  I consulted with neurology who recommends admission for TIA work-up.  Patient had no further symptoms while in the ED.  Patient was admitted to hospitalist for further studies.   Reevaluation:  After the interventions noted above, I reevaluated the patient and found that they have :resolved   Social Determinants of Health:  Patient has strong family support at  home, she does have access to outpatient care, including established care with neurology   Dispostion:  After consideration of the diagnostic results and the patients response to treatment, I feel that the patent would benefit from admission to hospital.          Final Clinical Impression(s) / ED Diagnoses Final diagnoses:  Transient confusion  Word finding difficulty    Rx / DC Orders ED Discharge Orders     None         Godfrey Pick, MD 06/14/21 1732

## 2021-06-14 NOTE — Progress Notes (Signed)
Contacted by Dr. Doren Custard this patient who had transient episode aphasia, now resolved c/f TIA.  Recommendations:  - Admit to hospitalist service for stroke w/u - Permissive HTN x48 hrs from sx onset or until stroke ruled out by MRI goal BP <220/110. PRN labetalol or hydralazine if BP above these parameters. Avoid oral antihypertensives. - MRI brain wo contrast - CTA/MRA H&N if not already obtained - TTE  - Check A1c and LDL + add statin per guidelines - ASA 81mg  daily + plavix 75mg  daily x21 days f/b plavix 75mg  daily after that - q4 hr neuro checks - STAT head CT for any change in neuro exam - Tele - PT/OT/SLP - Stroke education - Amb referral to neurology upon discharge   Su Monks, MD [facility] [phone]  If [place], please page neurology on call as listed in [place].

## 2021-06-14 NOTE — ED Triage Notes (Signed)
Daughter reports patient was fine and then around 1115 patient became confused with trouble speaking and saying her hearing aids were coming out her nose.

## 2021-06-14 NOTE — H&P (Signed)
History and Physical    [name]:[id number] DOB: 1941/03/25 DOA: 06/14/2021  PCP: Dettinger, Fransisca Kaufmann, MD  Patient coming from: Home  I have personally briefly reviewed patient's old medical records in [place]  Chief Complaint: Aphasia  HPI: Karen Potter is a 81 y.o. female with medical history significant for hypertension, diabetes mellitus, OSA. Patient was brought to the ED via EMS with reports of aphasia and confusion.  At the time of my evaluation patient is awake alert and oriented x4 and able to provide history.  Patient was at church Sunday school when suddenly reports patient was  confused and had trouble speaking, and she said that her hearing aids were coming out of her nose. Patient tells me she does not remember the exact details of all that happened so she must have been confused, but she states she could not hear what she was being told asked.  She tells me her speech sounds normal now.  There was no report of facial asymmetry, has mild chronic unchanged numbness to her bilateral feet, nothing new, no weakness of her extremities. When patient had a TIA in the past , at that time she had similar symptoms.  She reports compliance with aspirin 325 mg daily.  She reports urinary frequency without dysuria over the past few days.  Otherwise denies any other complaints.  ED Course: Symptoms are resolved by the time patient arrived to the ED.  Stable vitals.  Blood alcohol level less than 10, UA with small leukocytes many bacteria.  EKG shows sinus rhythm with PVCs. Head CT was without acute abnormality.  IV ceftriaxone started. Teleneurology consulted, stroke work-up recommended.  Review of Systems: As per HPI all other systems reviewed and negative.  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   age 74--  pt states was quarantined   no treatment ;  per pt no symptoms or issues since   History of kidney stones    History of TIA (transient ischemic attack)    03-23-2014  w/ episode temporay amnesia   Hyperlipidemia    Hypertension    Hypothyroidism    Iron deficiency anemia    LAFB (left anterior fascicular block)    OA (osteoarthritis)    OSA on CPAP    severe per study 2003   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   Post-polio limb muscle weakness    left leg  w/ 3 repair surgery's   RBBB (right bundle branch block)    Seasonal allergies    Type 2 diabetes mellitus ([place])    Vitamin D deficiency     Past Surgical History:  Procedure Laterality Date   CARPAL TUNNEL RELEASE Right 05-10-2007   CHOLECYSTECTOMY OPEN  1985   and Appendectomy   CYSTO/  RIGHT URETEROSCOPIC STONE EXTRACTON/  STENT PLACEMENT  06/ 2016   in [place]   CYSTOSCOPY/RETROGRADE/URETEROSCOPY/STONE EXTRACTION WITH BASKET Right 11/17/2015   Procedure: CYSTOSCOPY/RETROGRADE PYELOGRAM  RIGHT/DIAGNOSTIC RIGHT URETEROSCOPY/LASER RENAL CALCIFICATION/RIGHT STENT PLACEMENT;  Surgeon: Cleon Gustin, MD;  Location: [facility];  Service: Urology;  Laterality: Right;   ESOPHAGOGASTRODUODENOSCOPY N/A 08/29/2015   Procedure: ESOPHAGOGASTRODUODENOSCOPY (EGD);  Surgeon: Danie Binder, MD;  Location: [facility];  Service: Endoscopy;  Laterality: N/A;   HOLMIUM LASER APPLICATION Right 7/82/4235   Procedure: HOLMIUM LASER APPLICATION;  Surgeon: Cleon Gustin, MD;  Location: [facility];  Service: Urology;  Laterality: Right;   KNEE ARTHROSCOPY Right [place];  1985;  1995   polio   LUMBAR SPINE  SURGERY  09-01-2010   L4 -L5 fusion,  L3 laminectomy,  L5 - S1 foraminotomy   OVARIAN CYST SURGERY Right 1966   PARATHYROIDECTOMY N/A 05/06/2015   Procedure: PARATHYROIDECTOMY;  Surgeon: Armandina Gemma, MD;  Location: [facility];  Service: General;  Laterality: N/A;   left superior   TONSILLECTOMY  as child   TOTAL ABDOMINAL HYSTERECTOMY W/ BILATERAL SALPINGOOPHORECTOMY  1984   TOTAL KNEE ARTHROPLASTY Right 04-08-2008   TRANSTHORACIC ECHOCARDIOGRAM  04-07-2012   grade 1 diastolic function,  ef 36-14%/  mild AV calcification without stenosis/  trivial MR     reports that she has never smoked. She has never used smokeless tobacco. She reports that she does not drink alcohol and does not use drugs.  Allergies  Allergen Reactions   Codeine Itching   Dilaudid [Hydromorphone] Other (See Comments)    Mouth blisters   Other Itching    Most pain meds cause itching.  When has to take pain medication, she has been instructed to take Benadryl   Hydrocodone-Acetaminophen Other (See Comments)   Doxycycline Rash and Other (See Comments)    Family History  Problem Relation Age of Onset   Heart failure Mother        No details.  No MI   Cancer Father        Lung   Cancer Brother        Lung   Stroke Maternal Grandfather    Cancer Paternal Grandmother        rectal   Cancer Paternal Grandfather        gastric    Prior to Admission medications   Medication Sig Start Date End Date Taking? Authorizing Provider  acetaminophen (TYLENOL) 650 MG CR tablet Take 650 mg by mouth in the morning.    [provider]  albuterol (VENTOLIN HFA) 108 (90 Base) MCG/ACT inhaler Inhale 2 puffs into the lungs every 6 (six) hours as needed for wheezing or shortness of breath. 05/21/21   Dettinger, Fransisca Kaufmann, MD  allopurinol (ZYLOPRIM) 100 MG tablet Take 1 tablet (100 mg total) by mouth daily. 05/29/21   Dettinger, Fransisca Kaufmann, MD  amoxicillin-clavulanate (AUGMENTIN) 875-125 MG tablet Take 1 tablet by mouth 2 (two)  times daily. Patient not taking: Reported on 06/11/2021 05/21/21   Dettinger, Fransisca Kaufmann, MD  aspirin 325 MG tablet Take 325 mg by mouth every evening.     [provider]  Blood Glucose Monitoring Suppl (BLOOD GLUCOSE MONITOR SYSTEM) w/Device KIT Use glucose meter to check blood sugar up to 3 times daily as instructed. One Touch Verio preferred by insurance. Dx: EE11.69 05/12/18   Dettinger, Fransisca Kaufmann, MD  brompheniramine-pseudoephedrine-DM 30-2-10 MG/5ML syrup Take 2.5 mLs by mouth 4 (four) times daily as needed. Patient not taking: Reported on 06/11/2021 05/20/21   Ivy Lynn, NP  carvedilol (COREG) 6.25 MG tablet Take  1 tablet (6.25 mg total) by mouth 2 (two) times daily with a meal. 05/29/21   Dettinger, Fransisca Kaufmann, MD  Cholecalciferol (VITAMIN D3) 2000 UNITS capsule Take 2,000 Units by mouth 2 (two) times daily.     [provider]  diphenhydrAMINE HCl (ALLERGY MED PO) Take by mouth daily.    [provider]  ELDERBERRY PO Take by mouth.    [provider]  furosemide (LASIX) 40 MG tablet Take 1 tablet (40 mg total) by mouth daily. 05/29/21   Dettinger, Fransisca Kaufmann, MD  hydrocortisone 1 % lotion Apply 1 application topically 2 (two) times daily. 11/14/20   Ivy Lynn, NP  Lancets Misc. 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 05/29/21   Dettinger, Fransisca Kaufmann, MD  losartan (COZAAR) 25 MG tablet Take 1 tablet (25 mg total) by mouth daily. 05/29/21   Dettinger, Fransisca Kaufmann, MD  naproxen (NAPROSYN) 500 MG tablet Take 1 tablet (500 mg total) by mouth 2 (two) times daily. Patient not taking: Reported on 06/11/2021 11/23/20   Truddie Hidden, MD  Omega-3 Fatty Acids (FISH OIL TRIPLE STRENGTH) 1400 MG CAPS Take 1,000 mg by mouth 4 (four) times daily.     [provider]  omeprazole  (PRILOSEC) 20 MG capsule Take 1 capsule (20 mg total) by mouth 2 (two) times daily before a meal. 05/29/21   Dettinger, Fransisca Kaufmann, MD  ONETOUCH VERIO test strip USE 1 STRIP TO CHECK GLUCOSE UP TO THREE TIMES DAILY AS  INSTRUCTED 05/06/21   Dettinger, Fransisca Kaufmann, MD    Physical Exam: Vitals:   06/14/21 1730 06/14/21 1900 06/14/21 2000 06/14/21 2030  BP: 136/77 (!) 133/57 126/79 (!) 143/77  Pulse: 72  76 78  Resp: (!) 23 (!) '21 17 16  ' Temp:      TempSrc:      SpO2: 96%  97% 98%  Weight:      Height:        Constitutional: NAD, calm, comfortable Vitals:   06/14/21 1730 06/14/21 1900 06/14/21 2000 06/14/21 2030  BP: 136/77 (!) 133/57 126/79 (!) 143/77  Pulse: 72  76 78  Resp: (!) 23 (!) '21 17 16  ' Temp:      TempSrc:      SpO2: 96%  97% 98%  Weight:      Height:       Eyes: PERRL, lids and conjunctivae normal ENMT: Mucous membranes are moist.   Neck: normal, supple, no masses, no thyromegaly Respiratory: clear to auscultation bilaterally, no wheezing, no crackles. Normal respiratory effort. No accessory muscle use.  Cardiovascular: Regular rate and rhythm, no murmurs / rubs / gallops. No extremity edema. 2+ pedal pulses.  Abdomen: no tenderness, no masses palpated. No hepatosplenomegaly. Bowel sounds positive.  Musculoskeletal: no clubbing / cyanosis. No joint deformity upper and lower extremities. Good ROM, no contractures. Normal muscle tone.  Skin: no rashes, lesions, ulcers. No induration Neurologic:  Neurological:     Mental Status: She is alert.     GCS: GCS eye subscore is 4. GCS verbal subscore is 5. GCS motor subscore is 6.     Comments: Mental Status:  Alert, oriented, thought content appropriate, able to give a coherent history. Speech fluent without evidence of aphasia. Able to follow 2 step commands without difficulty.  Cranial Nerves:  II:  Peripheral visual fields grossly normal, pupils equal, round, reactive to light III,IV, VI: ptosis not present, extra-ocular  motions intact bilaterally  V,VII: smile symmetric, eyebrows raise symmetric, facial light touch sensation equal VIII: hearing grossly normal to voice  X  XI: bilateral shoulder shrug symmetric and strong XII: midline tongue extension without fassiculations Motor:  Normal tone.  5 of 5 strength bilateral upper and lower extremities.  Sensory: Sensation intact to light touch in all extremities.  Cerebellar: CV: distal pulses palpable throughout    Psychiatric: Normal judgment and insight. Alert and oriented x 3. Normal mood.   Labs on Admission: I have personally reviewed following labs and imaging studies  CBC: Recent Labs  Lab 06/14/21 1346  WBC 7.0  NEUTROABS 3.1  HGB 13.8  HCT 42.2  MCV 94.4  PLT 062   Basic Metabolic Panel: Recent Labs  Lab 06/14/21 1346  NA 138  K 3.3*  CL 98  CO2 29  GLUCOSE 185*  BUN 12  CREATININE 0.90  CALCIUM 8.8*   GFR: Estimated Creatinine Clearance: 61 mL/min (by C-G formula based on SCr of 0.9 mg/dL). Liver Function Tests: Recent Labs  Lab 06/14/21 1346  AST 28  ALT 29  ALKPHOS 88  BILITOT 0.6  PROT 7.7  ALBUMIN 3.6   Coagulation Profile: Recent Labs  Lab 06/14/21 1346  INR 1.0   CBG: Recent Labs  Lab 06/14/21 1358  GLUCAP 200*   Urine analysis:    Component Value Date/Time   COLORURINE YELLOW 06/14/2021 [place] 06/14/2021 1358   APPEARANCEUR Clear 07/05/2017 1552   LABSPEC <1.005 (L) 06/14/2021 1358   PHURINE 6.0 06/14/2021 1358   GLUCOSEU NEGATIVE 06/14/2021 1358   HGBUR TRACE (A) 06/14/2021 1358   BILIRUBINUR NEGATIVE 06/14/2021 1358   BILIRUBINUR Negative 07/05/2017 1552   KETONESUR NEGATIVE 06/14/2021 1358   PROTEINUR NEGATIVE 06/14/2021 1358   UROBILINOGEN negative 06/01/2013 1142   UROBILINOGEN 0.2 04/08/2012 0045   NITRITE NEGATIVE 06/14/2021 1358   LEUKOCYTESUR SMALL (A) 06/14/2021 1358    Radiological Exams on Admission: CT HEAD WO CONTRAST  Result Date: 06/14/2021 CLINICAL  DATA:  TIA.  Confusion.  Difficulty speaking. EXAM: CT HEAD WITHOUT CONTRAST TECHNIQUE: Contiguous axial images were obtained from the base of the skull through the vertex without intravenous contrast. RADIATION DOSE REDUCTION: This exam was performed according to the departmental dose-optimization program which includes automated exposure control, adjustment of the mA and/or kV according to patient size and/or use of iterative reconstruction technique. COMPARISON:  April 08, 2012 FINDINGS: Brain: No evidence of acute infarction, hemorrhage, hydrocephalus, extra-axial collection or mass lesion/mass effect. Vascular: No hyperdense vessel or unexpected calcification. Skull: Normal. Negative for fracture or focal lesion. Sinuses/Orbits: No acute finding. Other: None. IMPRESSION: No acute intracranial abnormalities. Electronically Signed   By: Dorise Bullion III M.D.   On: 06/14/2021 15:17    EKG: Independently reviewed.  Sinus rhythm rate 81, QTc 474.  RBBB, LAFB.  PVCs present.  No significant change from prior  Assessment/Plan Principal Problem:   Aphasia Active Problems:   OBSTRUCTIVE SLEEP APNEA   Hypertension associated with diabetes ([place])   Hypothyroid  Aphasia-with transient confusion, resolved and back to baseline.  Head CT without acute abnormality.  Similar occurrence diagnosed with TIA.  Reports compliance with aspirin 325 mg daily -Teleneurology consulted, allow for permissive hypertension, MRI brain, CTA/MRA head and neck - ASA 39m daily + plavix 759mdaily x21 days f/b plavix 7560maily  after that -  Obtain echo -Recent hemoglobin A1c, 05/2021 - 7.3 - lipid panel -PT speech therapy evaluation  - 325 mg aspirin given  UTI reports urinary frequency.  Rules out for sepsis.  WBC 7.  Afebrile.  UA with small leukocytes many bacteria.  No recent urine cultures. -Continue IV ceftriaxone 2 g daily -Follow-up urine cultures  OSA- CPAP nightly  Hypertension -Allow for permissive  hypertension, hold losartan 25 mg daily, Coreg 6.25 mg twice daily, Lasix 40 mg daily,  Controlled diabetes mellitus-random glucose 185.  Not on medication.  A1c 7.3   DVT prophylaxis: Lovenox Code Status: Full Family Communication: None at bedside Disposition Plan: ~ 1-2 days Consults called: None Admission status:  Obs tele   Bethena Roys MD [facility]  06/14/2021, 9:36 PM

## 2021-06-15 ENCOUNTER — Observation Stay (HOSPITAL_COMMUNITY): Payer: PPO

## 2021-06-15 ENCOUNTER — Other Ambulatory Visit (HOSPITAL_COMMUNITY): Payer: PPO

## 2021-06-15 ENCOUNTER — Other Ambulatory Visit (HOSPITAL_COMMUNITY): Payer: Self-pay | Admitting: *Deleted

## 2021-06-15 DIAGNOSIS — G459 Transient cerebral ischemic attack, unspecified: Secondary | ICD-10-CM | POA: Diagnosis not present

## 2021-06-15 DIAGNOSIS — E039 Hypothyroidism, unspecified: Secondary | ICD-10-CM

## 2021-06-15 DIAGNOSIS — E1159 Type 2 diabetes mellitus with other circulatory complications: Secondary | ICD-10-CM | POA: Diagnosis not present

## 2021-06-15 DIAGNOSIS — R4701 Aphasia: Secondary | ICD-10-CM | POA: Diagnosis not present

## 2021-06-15 DIAGNOSIS — R410 Disorientation, unspecified: Secondary | ICD-10-CM | POA: Diagnosis not present

## 2021-06-15 DIAGNOSIS — I152 Hypertension secondary to endocrine disorders: Secondary | ICD-10-CM | POA: Diagnosis not present

## 2021-06-15 LAB — LIPID PANEL
Cholesterol: 132 mg/dL (ref 0–200)
HDL: 28 mg/dL — ABNORMAL LOW (ref >40)
LDL Cholesterol: 56 mg/dL (ref 0–99)
Total CHOL/HDL Ratio: 4.7 RATIO
Triglycerides: 240 mg/dL — ABNORMAL HIGH (ref <150)
VLDL: 48 mg/dL — ABNORMAL HIGH (ref 0–40)

## 2021-06-15 LAB — IRON AND TIBC
Iron: 61 ug/dL (ref 28–170)
Saturation Ratios: 19 % (ref 10.4–31.8)
TIBC: 329 ug/dL (ref 250–450)
UIBC: 268 ug/dL

## 2021-06-15 MED ORDER — GADOBUTROL 1 MMOL/ML IV SOLN
10.0000 mL | Freq: Once | INTRAVENOUS | Status: AC | PRN
  Administered 2021-06-15: 10 mL via INTRAVENOUS

## 2021-06-15 MED ORDER — ASPIRIN EC 81 MG PO TBEC: 81.0000 mg | DELAYED_RELEASE_TABLET | Freq: Every day | ORAL | 0 refills | Status: AC

## 2021-06-15 MED ORDER — CEFDINIR 300 MG PO CAPS: 300.0000 mg | ORAL_CAPSULE | Freq: Every day | ORAL | 0 refills | Status: DC

## 2021-06-15 MED ORDER — SODIUM CHLORIDE 0.9 % IV SOLN: 1.0000 g | INTRAVENOUS | Status: DC

## 2021-06-15 MED ORDER — FUROSEMIDE 40 MG PO TABS: 40.0000 mg | ORAL_TABLET | Freq: Every day | ORAL | 1 refills | Status: DC

## 2021-06-15 MED ORDER — CLOPIDOGREL BISULFATE 75 MG PO TABS: 75.0000 mg | ORAL_TABLET | Freq: Every evening | ORAL | 1 refills | Status: DC

## 2021-06-15 MED ORDER — PANTOPRAZOLE SODIUM 40 MG PO TBEC: 40.0000 mg | DELAYED_RELEASE_TABLET | Freq: Every day | ORAL | 1 refills | Status: DC

## 2021-06-15 NOTE — Evaluation (Signed)
Physical Therapy Evaluation Patient Details Name: Karen Potter MRN: [id number] DOB: 06-10-1940 Today's Date: 06/15/2021  History of Present Illness  Karen Potter is a 81 y.o. female with medical history significant for hypertension, diabetes mellitus, OSA.  Patient was brought to the ED via EMS with reports of aphasia and confusion.  At the time of my evaluation patient is awake alert and oriented x4 and able to provide history.  Patient was at church Sunday school when suddenly reports patient was  confused and had trouble speaking, and she said that her hearing aids were coming out of her nose.   Clinical Impression  Patient functioning at baseline for functional mobility and gait with good return for ambulation in room, hallways and up/down stairs in stairwell without loss of balance.  Plan:  Patient discharged from physical therapy to care of nursing for ambulation daily as tolerated for length of stay.         Recommendations for follow up therapy are one component of a multi-disciplinary discharge planning process, led by the attending physician.  Recommendations may be updated based on patient status, additional functional criteria and insurance authorization.  Follow Up Recommendations No PT follow up    Assistance Recommended at Discharge PRN  Patient can return home with the following  Help with stairs or ramp for entrance;Assistance with cooking/housework    Equipment Recommendations None recommended by PT  Recommendations for Other Services       Functional Status Assessment Patient has not had a recent decline in their functional status     Precautions / Restrictions Precautions Precautions: None Restrictions Weight Bearing Restrictions: No      Mobility  Bed Mobility Overal bed mobility: Independent                  Transfers Overall transfer level: Independent                      Ambulation/Gait Ambulation/Gait assistance: Modified  independent (Device/Increase time) Gait Distance (Feet): 150 Feet Assistive device: None Gait Pattern/deviations: Decreased step length - right, Decreased step length - left, Decreased stride length Gait velocity: decreased     General Gait Details: slightly labored cadence with good return for ambulaiton in room, hallways without loss of balance  Stairs Stairs: Yes Stairs assistance: Modified independent (Device/Increase time) Stair Management: Step to pattern, One rail Right, One rail Left, Two rails Number of Stairs: 5 General stair comments: demonstrates good return for going up steps using 1 side rail and coming down steps using bilateral side rails without loss of balance  Wheelchair Mobility    Modified Rankin (Stroke Patients Only)       Balance Overall balance assessment: Mild deficits observed, not formally tested                                           Pertinent Vitals/Pain Pain Assessment Pain Assessment: No/denies pain    Home Living Family/patient expects to be discharged to:: Private residence Living Arrangements: Spouse/significant other Available Help at Discharge: Family;Friend(s);Available 24 hours/day Type of Home: House Home Access: Stairs to enter Entrance Stairs-Rails: Right Entrance Stairs-Number of Steps: 4-5 Alternate Level Stairs-Number of Steps: 10 Home Layout: Two level;Able to live on main level with bedroom/bathroom Home Equipment: Cane - single point;Rolling Walker (2 wheels);Grab bars - tub/shower      Prior Function  Prior Level of Function : Independent/Modified Independent                     Hand Dominance   Dominant Hand: Right    Extremity/Trunk Assessment   Upper Extremity Assessment Upper Extremity Assessment: Defer to OT evaluation    Lower Extremity Assessment Lower Extremity Assessment: Overall WFL for tasks assessed    Cervical / Trunk Assessment Cervical / Trunk Assessment: Normal   Communication   Communication: No difficulties  Cognition Arousal/Alertness: Awake/alert Behavior During Therapy: WFL for tasks assessed/performed Overall Cognitive Status: Within Functional Limits for tasks assessed                                          General Comments      Exercises     Assessment/Plan    PT Assessment Patient does not need any further PT services  PT Problem List         PT Treatment Interventions      PT Goals (Current goals can be found in the Care Plan section)  Acute Rehab PT Goals Patient Stated Goal: return home with family to assist PT Goal Formulation: With patient/family Time For Goal Achievement: 06/15/21 Potential to Achieve Goals: Good    Frequency       Co-evaluation PT/OT/SLP Co-Evaluation/Treatment: Yes Reason for Co-Treatment: To address functional/ADL transfers PT goals addressed during session: Mobility/safety with mobility;Balance         AM-PAC PT "6 Clicks" Mobility  Outcome Measure Help needed turning from your back to your side while in a flat bed without using bedrails?: None Help needed moving from lying on your back to sitting on the side of a flat bed without using bedrails?: None Help needed moving to and from a bed to a chair (including a wheelchair)?: None Help needed standing up from a chair using your arms (e.g., wheelchair or bedside chair)?: None Help needed to walk in hospital room?: None Help needed climbing 3-5 steps with a railing? : None 6 Click Score: 24    End of Session   Activity Tolerance: Patient tolerated treatment well;Patient limited by fatigue Patient left: in bed;with call bell/phone within reach Nurse Communication: Mobility status PT Visit Diagnosis: Unsteadiness on feet (R26.81);Other abnormalities of gait and mobility (R26.89);Muscle weakness (generalized) (M62.81)    Time: 5208-0223 PT Time Calculation (min) (ACUTE ONLY): 20 min   Charges:   PT  Evaluation $PT Eval Moderate Complexity: 1 Mod PT Treatments $Therapeutic Activity: 8-22 mins        2:19 PM, 06/15/21 Lonell Grandchild, MPT Physical Therapist with [facility] [phone] office [phone] mobile phone

## 2021-06-15 NOTE — Progress Notes (Signed)
Patient using her own CPAP 12 , from home .

## 2021-06-15 NOTE — Evaluation (Signed)
Occupational Therapy Evaluation Patient Details Name: Karen Potter MRN: [id number] DOB: 1940-07-11 Today's Date: 06/15/2021   History of Present Illness Karen Potter is a 81 y.o. female with medical history significant for hypertension, diabetes mellitus, OSA.  Patient was brought to the ED via EMS with reports of aphasia and confusion.  At the time of my evaluation patient is awake alert and oriented x4 and able to provide history.  Patient was at church Sunday [employer] when suddenly reports patient was  confused and had trouble speaking, and she said that her hearing aids were coming out of her nose.   Clinical Impression   Pt in room and seated on EOB upon therapy arrival. Pt agreeable to participate in OT evaluation. Patient is functioning at baseline and able to complete all required ADL tasks without assistance. Due to fatigue this morning, she took her time to complete activities at time although she was able to safely.       Recommendations for follow up therapy are one component of a multi-disciplinary discharge planning process, led by the attending physician.  Recommendations may be updated based on patient status, additional functional criteria and insurance authorization.   Follow Up Recommendations  No OT follow up    Assistance Recommended at Discharge None     Functional Status Assessment  Patient has not had a recent decline in their functional status  Equipment Recommendations  None recommended by OT       Precautions / Restrictions Precautions Precautions: None Restrictions Weight Bearing Restrictions: No      Mobility Bed Mobility Overal bed mobility: Independent      Transfers Overall transfer level: Independent Equipment used: None             ADL either performed or assessed with clinical judgement   ADL Overall ADL's : Needs assistance/impaired;Modified independent;At baseline             Vision Baseline Vision/History: 1 Wears  glasses Ability to See in Adequate Light: 0 Adequate Patient Visual Report: No change from baseline Vision Assessment?: No apparent visual deficits     Perception Perception Perception: Within Functional Limits   Praxis Praxis Praxis: Intact    Pertinent Vitals/Pain Pain Assessment Pain Assessment: No/denies pain     Hand Dominance Right   Extremity/Trunk Assessment Upper Extremity Assessment Upper Extremity Assessment: Overall WFL for tasks assessed   Lower Extremity Assessment Lower Extremity Assessment: Defer to PT evaluation       Communication Communication Communication: No difficulties   Cognition Arousal/Alertness: Awake/alert Behavior During Therapy: WFL for tasks assessed/performed Overall Cognitive Status: Within Functional Limits for tasks assessed                  Home Living Family/patient expects to be discharged to:: Private residence Living Arrangements: Spouse/significant other Available Help at Discharge: Family;Available 24 hours/day Type of Home: House Home Access: Stairs to enter [employer] of Steps: 4-5 Entrance Stairs-Rails: Right Home Layout: Two level;Able to live on main level with bedroom/bathroom Alternate Level Stairs-Number of Steps: 10 Alternate Level Stairs-Rails: Left Bathroom Shower/Tub: Tub/shower unit   Bathroom Toilet: Standard     Home Equipment: Cane - single [employer]ista (2 wheels);Grab bars - tub/shower          Prior Functioning/Environment Prior Level of Function : Independent/Modified Independent              OT Problem List: Decreased strength  Co-evaluation PT/OT/SLP Co-Evaluation/Treatment: Yes Reason for Co-Treatment: To address functional/ADL transfers   OT goals addressed during session: ADL's and self-care;Proper use of Adaptive equipment and DME;Strengthening/ROM      AM-PAC OT "6 Clicks" Daily Activity     Outcome Measure Help from another person  eating meals?: None Help from another person taking care of personal grooming?: None Help from another person toileting, which includes using toliet, bedpan, or urinal?: None Help from another person bathing (including washing, rinsing, drying)?: None Help from another person to put on and taking off regular upper body clothing?: None Help from another person to put on and taking off regular lower body clothing?: None 6 Click Score: 24   End of Session    Activity Tolerance: Patient tolerated treatment well Patient left: in bed;with call bell/phone within reach  OT Visit Diagnosis: Muscle weakness (generalized) (M62.81)                Time: 3014-9969 OT Time Calculation (min): 15 min Charges:  OT General Charges $OT Visit: 1 Visit OT Evaluation $OT Eval Low Complexity: 1 Low  Ailene Ravel, OTR/L,CBIS  [phone]   Seleste Tallman, Clarene Duke 06/15/2021, 11:03 AM

## 2021-06-15 NOTE — Progress Notes (Addendum)
Physician Discharge Summary  Karen Potter CBU:[id number] DOB: 1940-09-13 DOA: 06/14/2021  PCP: Dettinger, Fransisca Kaufmann, MD Neurologist: [facility]  Admit date: 06/14/2021 Discharge date: 06/15/2021  Admitted From:   HOME  Disposition: HOME   Recommendations for Outpatient Follow-up:  Follow up with PCP in 1 weeks Follow-up with [facility] neurology in 2 to 3 weeks Instructions given for patient to take aspirin 81 mg daily with Plavix for 21 days followed by Plavix alone.  Please follow up 2D echocardiogram still pending at time of discharge. Please follow up final urine culture.   Discharge Condition: STABLE   CODE STATUS: FULL  DIET: HEART HEALTHY    Brief Hospitalization Summary: Please see all hospital notes, images, labs for full details of the hospitalization. ADMISSION HPI:  Karen Potter is a 81 y.o. female with medical history significant for hypertension, diabetes mellitus, OSA.  Patient was brought to the ED via EMS with reports of aphasia and confusion.  At the time of my evaluation patient is awake alert and oriented x4 and able to provide history.  Patient was at church Sunday school when suddenly reports patient was  confused and had trouble speaking, and she said that her hearing aids were coming out of her nose. Patient tells me she does not remember the exact details of all that happened so she must have been confused, but she states she could not hear what she was being told asked.  She tells me her speech sounds normal now.  There was no report of facial asymmetry, has mild chronic unchanged numbness to her bilateral feet, nothing new, no weakness of her extremities.  When patient had a TIA in the past , at that time she had similar symptoms.  She reports compliance with aspirin 325 mg daily.   She reports urinary frequency without dysuria over the past few days.  Otherwise denies any other complaints.   ED Course: Symptoms are resolved by the time patient arrived to  the ED.  Stable vitals.  Blood alcohol level less than 10, UA with small leukocytes many bacteria.  EKG shows sinus rhythm with PVCs.  Head CT was without acute abnormality.  IV ceftriaxone started.  Teleneurology consulted, stroke work-up recommended.  Hospital course Patient was admitted for presumed TIA and had a full work-up including an MRI MRA head and neck and fortunately we did not have any findings of acute ischemic infarct she was seen by telemetry neurology and recommendations were for her to have a full stroke work-up including changing her medications to include aspirin 81 mg daily with Plavix for 21 days followed by Plavix alone.  Her LDL cholesterol was 56 and statin was not started.  She had normal iron studies.  She did have findings of UTI which was treated with ceftriaxone and cefdinir.  A 2D echocardiogram was ordered but is still pending at time of discharge.  She can have it done outpatient when she follows up with her neurologist.  She says that she sees a neurologist at [facility]y in [place].  Her exam has been reassuring and her symptoms have completely resolved.  She is stable to discharge home with outpatient follow-up with her primary care provider and neurologist.  Continue CPAP therapy.  Her other medical problems have been stable.  The only change we made to her medications was to discontinue omeprazole due to interaction with Plavix and we have placed her on Protonix for acid reflux symptoms.  Patient is stable to discharge home  today.  Her daughter has been updated.  Discharge Diagnoses:  Principal Problem:   TIA (transient ischemic attack) Active Problems:   OBSTRUCTIVE SLEEP APNEA   Aphasia   Hypertension associated with diabetes (HCC)   Hypothyroid   UTI (urinary tract infection)  Discharge Instructions: Discharge Instructions     Ambulatory referral to Neurology   Complete by: As directed    An appointment is requested in approximately: 2 weeks    [place] TIA      Allergies as of 06/15/2021       Reactions   Codeine Itching   Dilaudid [hydromorphone] Other (See Comments)   Mouth blisters   Other Itching   Most pain meds cause itching.  When has to take pain medication, she has been instructed to take Benadryl   Hydrocodone-acetaminophen Other (See Comments)   Doxycycline Rash, Other (See Comments)        Medication List     STOP taking these medications    amoxicillin-clavulanate 875-125 MG tablet Commonly known as: AUGMENTIN   aspirin 325 MG tablet Replaced by: aspirin EC 81 MG tablet   brompheniramine-pseudoephedrine-DM 30-2-10 MG/5ML syrup   hydrocortisone 1 % lotion   naproxen 500 MG tablet Commonly known as: NAPROSYN   omeprazole 20 MG capsule Commonly known as: PRILOSEC       TAKE these medications    acetaminophen 650 MG CR tablet Commonly known as: TYLENOL Take 650 mg by mouth in the morning.   albuterol 108 (90 Base) MCG/ACT inhaler Commonly known as: VENTOLIN HFA Inhale 2 puffs into the lungs every 6 (six) hours as needed for wheezing or shortness of breath.   allopurinol 100 MG tablet Commonly known as: ZYLOPRIM Take 1 tablet (100 mg total) by mouth daily.   aspirin EC 81 MG tablet Take 1 tablet (81 mg total) by mouth daily for 21 days. Swallow whole. Replaces: aspirin 325 MG tablet   Blood Glucose Monitor System w/Device Kit Use glucose meter to check blood sugar up to 3 times daily as instructed. One Touch Verio preferred by insurance. 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   cefdinir 300 MG capsule Commonly known as: OMNICEF Take 1 capsule (300 mg total) by mouth daily for 3 days.   clopidogrel 75 MG tablet Commonly known as: PLAVIX Take 1 tablet (75 mg total) by mouth every evening.   Elderberry 500 MG Caps Take 500 mg by mouth daily.   Fish Oil Triple Strength  1400 MG Caps Take 1,000 mg by mouth 4 (four) times daily.   furosemide 40 MG tablet Commonly known as: LASIX Take 1 tablet (40 mg total) by mouth daily. Start taking on: June 16, 2021   Lancets Misc. Kit Use patient & insurance preferred lancet device to test blood glucose up to 3 times daily for Dx: E11.69   levothyroxine 150 MCG tablet Commonly known as: Euthyrox Take 1 tablet (150 mcg total) by mouth daily.   losartan 25 MG tablet Commonly known as: COZAAR Take 1 tablet (25 mg total) by mouth daily.   OneTouch Verio test strip Generic drug: glucose blood USE 1 STRIP TO CHECK GLUCOSE UP TO THREE TIMES DAILY AS  INSTRUCTED   pantoprazole 40 MG tablet Commonly known as: Protonix Take 1 tablet (40 mg total) by mouth daily.   Vitamin D3 50 MCG (2000 UT) capsule Take 2,000 Units by  mouth 2 (two) times daily.        Follow-up Information     Dettinger, Fransisca Kaufmann, MD. Schedule an appointment as soon as possible for a visit in 1 week(s).   Specialties: Family Medicine, Cardiology Why: Hospital Follow Up Contact information: [place] [address] [phone]         Minus Breeding, MD .   Specialty: Cardiology Contact information: [address] [place] [address] [phone]         [place]. Schedule an appointment as soon as possible for a visit in 2 week(s).   Why: Hospital Follow Up Contact information: [address]     [address] [place] [address] [phone]               Allergies  Allergen Reactions   Codeine Itching   Dilaudid [Hydromorphone] Other (See Comments)    Mouth blisters   Other Itching    Most pain meds cause itching.  When has to take pain medication, she has been instructed to take Benadryl   Hydrocodone-Acetaminophen Other (See Comments)   Doxycycline Rash and Other (See Comments)   Allergies as of 06/15/2021       Reactions   Codeine Itching    Dilaudid [hydromorphone] Other (See Comments)   Mouth blisters   Other Itching   Most pain meds cause itching.  When has to take pain medication, she has been instructed to take Benadryl   Hydrocodone-acetaminophen Other (See Comments)   Doxycycline Rash, Other (See Comments)        Medication List     STOP taking these medications    amoxicillin-clavulanate 875-125 MG tablet Commonly known as: AUGMENTIN   aspirin 325 MG tablet Replaced by: aspirin EC 81 MG tablet   brompheniramine-pseudoephedrine-DM 30-2-10 MG/5ML syrup   hydrocortisone 1 % lotion   naproxen 500 MG tablet Commonly known as: NAPROSYN   omeprazole 20 MG capsule Commonly known as: PRILOSEC       TAKE these medications    acetaminophen 650 MG CR tablet Commonly known as: TYLENOL Take 650 mg by mouth in the morning.   albuterol 108 (90 Base) MCG/ACT inhaler Commonly known as: VENTOLIN HFA Inhale 2 puffs into the lungs every 6 (six) hours as needed for wheezing or shortness of breath.   allopurinol 100 MG tablet Commonly known as: ZYLOPRIM Take 1 tablet (100 mg total) by mouth daily.   aspirin EC 81 MG tablet Take 1 tablet (81 mg total) by mouth daily for 21 days. Swallow whole. Replaces: aspirin 325 MG tablet   Blood Glucose Monitor System w/Device Kit Use glucose meter to check blood sugar up to 3 times daily as instructed. One Touch Verio preferred by insurance. 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   cefdinir 300 MG capsule Commonly known as: OMNICEF Take 1 capsule (300 mg total) by mouth daily for 3 days.   clopidogrel 75 MG tablet Commonly known as: PLAVIX Take 1 tablet (75 mg total) by mouth every evening.   Elderberry 500 MG Caps Take 500 mg by mouth daily.   Fish Oil Triple Strength 1400 MG Caps Take 1,000 mg by mouth 4 (four) times daily.   furosemide 40 MG  tablet Commonly known as: LASIX Take 1 tablet (40 mg total) by mouth daily. Start taking on: June 16, 2021  Lancets Misc. Kit Use patient & insurance preferred lancet device to test blood glucose up to 3 times daily for Dx: E11.69   levothyroxine 150 MCG tablet Commonly known as: Euthyrox Take 1 tablet (150 mcg total) by mouth daily.   losartan 25 MG tablet Commonly known as: COZAAR Take 1 tablet (25 mg total) by mouth daily.   OneTouch Verio test strip Generic drug: glucose blood USE 1 STRIP TO CHECK GLUCOSE UP TO THREE TIMES DAILY AS  INSTRUCTED   pantoprazole 40 MG tablet Commonly known as: Protonix Take 1 tablet (40 mg total) by mouth daily.   Vitamin D3 50 MCG (2000 UT) capsule Take 2,000 Units by mouth 2 (two) times daily.        Procedures/Studies: DG Chest 2 View  Result Date: 05/18/2021 CLINICAL DATA:  81 year old female with diminishing lungs sounds EXAM: CHEST - 2 VIEW COMPARISON:  08/17/2019 FINDINGS: Cardiomediastinal silhouette unchanged in size and contour. No evidence of central vascular congestion. No interlobular septal thickening. Asymmetric elevation of the right hemidiaphragm, unchanged. Low lung volumes. No pneumothorax or pleural effusion. Coarsened interstitial markings, with no confluent airspace disease. No acute displaced fracture. Degenerative changes of the spine. IMPRESSION: Negative for acute cardiopulmonary disease Electronically Signed   By: Corrie Mckusick D.O.   On: 05/18/2021 14:44   CT HEAD WO CONTRAST  Result Date: 06/14/2021 CLINICAL DATA:  TIA.  Confusion.  Difficulty speaking. EXAM: CT HEAD WITHOUT CONTRAST TECHNIQUE: Contiguous axial images were obtained from the base of the skull through the vertex without intravenous contrast. RADIATION DOSE REDUCTION: This exam was performed according to the departmental dose-optimization program which includes automated exposure control, adjustment of the mA and/or kV according to patient size and/or  use of iterative reconstruction technique. COMPARISON:  April 08, 2012 FINDINGS: Brain: No evidence of acute infarction, hemorrhage, hydrocephalus, extra-axial collection or mass lesion/mass effect. Vascular: No hyperdense vessel or unexpected calcification. Skull: Normal. Negative for fracture or focal lesion. Sinuses/Orbits: No acute finding. Other: None. IMPRESSION: No acute intracranial abnormalities. Electronically Signed   By: Dorise Bullion III M.D.   On: 06/14/2021 15:17   MR ANGIO HEAD WO CONTRAST  Result Date: 06/15/2021 CLINICAL DATA:  Transient aphasia, confusion EXAM: MRI HEAD WITHOUT CONTRAST MRA HEAD WITHOUT CONTRAST MRA NECK WITHOUT AND WITH CONTRAST TECHNIQUE: Multiplanar, multi-echo pulse sequences of the brain and surrounding structures were acquired without intravenous contrast. Angiographic images of the Circle of Willis were acquired using MRA technique without intravenous contrast. Angiographic images of the neck were acquired using MRA technique without and with intravenous contrast. Carotid stenosis measurements (when applicable) are obtained utilizing NASCET criteria, using the distal internal carotid diameter as the denominator. CONTRAST:  25m GADAVIST GADOBUTROL 1 MMOL/ML IV SOLN COMPARISON:  MRI brain 2020 FINDINGS: MRI HEAD Brain: There is no acute infarction or intracranial hemorrhage. There is no intracranial mass, mass effect, or edema. There is no hydrocephalus or extra-axial fluid collection. Ventricles and sulci are within normal limits in size and configuration Vascular: Major vessel flow voids at the skull base are preserved. Skull and upper cervical spine: Normal marrow signal is preserved. Sinuses/Orbits: Paranasal sinuses are aerated. Bilateral lens replacements. Other: Sella is unremarkable. Patchy left greater than right mastoid fluid opacification. MRA HEAD Intracranial internal carotid arteries are patent. Middle and anterior cerebral arteries are patent.  Intracranial vertebral arteries, basilar artery, posterior cerebral arteries are patent. There is no significant stenosis or aneurysm. MRA NECK Great vessel origins are patent. Common, internal, and external carotid arteries  are patent. Codominant extracranial vertebral arteries are patent. No hemodynamically significant stenosis or evidence of dissection. IMPRESSION: No acute infarction, hemorrhage, or mass. No large vessel occlusion, hemodynamically significant stenosis, or evidence of dissection. Electronically Signed   By: Macy Mis M.D.   On: 06/15/2021 12:52   MR ANGIO NECK W WO CONTRAST  Result Date: 06/15/2021 CLINICAL DATA:  Transient aphasia, confusion EXAM: MRI HEAD WITHOUT CONTRAST MRA HEAD WITHOUT CONTRAST MRA NECK WITHOUT AND WITH CONTRAST TECHNIQUE: Multiplanar, multi-echo pulse sequences of the brain and surrounding structures were acquired without intravenous contrast. Angiographic images of the Circle of Willis were acquired using MRA technique without intravenous contrast. Angiographic images of the neck were acquired using MRA technique without and with intravenous contrast. Carotid stenosis measurements (when applicable) are obtained utilizing NASCET criteria, using the distal internal carotid diameter as the denominator. CONTRAST:  48m GADAVIST GADOBUTROL 1 MMOL/ML IV SOLN COMPARISON:  MRI brain 2020 FINDINGS: MRI HEAD Brain: There is no acute infarction or intracranial hemorrhage. There is no intracranial mass, mass effect, or edema. There is no hydrocephalus or extra-axial fluid collection. Ventricles and sulci are within normal limits in size and configuration Vascular: Major vessel flow voids at the skull base are preserved. Skull and upper cervical spine: Normal marrow signal is preserved. Sinuses/Orbits: Paranasal sinuses are aerated. Bilateral lens replacements. Other: Sella is unremarkable. Patchy left greater than right mastoid fluid opacification. MRA HEAD Intracranial  internal carotid arteries are patent. Middle and anterior cerebral arteries are patent. Intracranial vertebral arteries, basilar artery, posterior cerebral arteries are patent. There is no significant stenosis or aneurysm. MRA NECK Great vessel origins are patent. Common, internal, and external carotid arteries are patent. Codominant extracranial vertebral arteries are patent. No hemodynamically significant stenosis or evidence of dissection. IMPRESSION: No acute infarction, hemorrhage, or mass. No large vessel occlusion, hemodynamically significant stenosis, or evidence of dissection. Electronically Signed   By: PMacy MisM.D.   On: 06/15/2021 12:52   MR BRAIN WO CONTRAST  Result Date: 06/15/2021 CLINICAL DATA:  Transient aphasia, confusion EXAM: MRI HEAD WITHOUT CONTRAST MRA HEAD WITHOUT CONTRAST MRA NECK WITHOUT AND WITH CONTRAST TECHNIQUE: Multiplanar, multi-echo pulse sequences of the brain and surrounding structures were acquired without intravenous contrast. Angiographic images of the Circle of Willis were acquired using MRA technique without intravenous contrast. Angiographic images of the neck were acquired using MRA technique without and with intravenous contrast. Carotid stenosis measurements (when applicable) are obtained utilizing NASCET criteria, using the distal internal carotid diameter as the denominator. CONTRAST:  144mGADAVIST GADOBUTROL 1 MMOL/ML IV SOLN COMPARISON:  MRI brain 2020 FINDINGS: MRI HEAD Brain: There is no acute infarction or intracranial hemorrhage. There is no intracranial mass, mass effect, or edema. There is no hydrocephalus or extra-axial fluid collection. Ventricles and sulci are within normal limits in size and configuration Vascular: Major vessel flow voids at the skull base are preserved. Skull and upper cervical spine: Normal marrow signal is preserved. Sinuses/Orbits: Paranasal sinuses are aerated. Bilateral lens replacements. Other: Sella is unremarkable. Patchy  left greater than right mastoid fluid opacification. MRA HEAD Intracranial internal carotid arteries are patent. Middle and anterior cerebral arteries are patent. Intracranial vertebral arteries, basilar artery, posterior cerebral arteries are patent. There is no significant stenosis or aneurysm. MRA NECK Great vessel origins are patent. Common, internal, and external carotid arteries are patent. Codominant extracranial vertebral arteries are patent. No hemodynamically significant stenosis or evidence of dissection. IMPRESSION: No acute infarction, hemorrhage, or mass. No large vessel occlusion, hemodynamically  significant stenosis, or evidence of dissection. Electronically Signed   By: Macy Mis M.D.   On: 06/15/2021 12:52     Subjective: Patient reports she is feeling much better.  She has had no recurrence of her symptoms.  Her speech has been normal.  She is thinking clearly and she is having no weakness.  Discharge Exam: Vitals:   06/15/21 0458 06/15/21 1228  BP: 110/62 105/65  Pulse: 78 73  Resp: 18 18  Temp: 98.1 F (36.7 C) 98.1 F (36.7 C)  SpO2: 94% 97%   Vitals:   06/15/21 0001 06/15/21 0106 06/15/21 0458 06/15/21 1228  BP: 118/75  110/62 105/65  Pulse: 74 80 78 73  Resp: '18 20 18 18  ' Temp: 98.4 F (36.9 C)  98.1 F (36.7 C) 98.1 F (36.7 C)  TempSrc: Oral  Oral   SpO2: 97%  94% 97%  Weight:      Height:       General: Pt is alert, awake, not in acute distress Cardiovascular: RRR, S1/S2 +, no rubs, no gallops Respiratory: CTA bilaterally, no wheezing, no rhonchi Abdominal: Soft, NT, ND, bowel sounds + Extremities: no edema, no cyanosis Neurological: Full exam completed no focal findings.   The results of significant diagnostics from this hospitalization (including imaging, microbiology, ancillary and laboratory) are listed below for reference.     Microbiology: Recent Results (from the past 240 hour(s))  Resp Panel by RT-PCR (Flu A&B, Covid) Nasopharyngeal  Swab     Status: None   Collection Time: 06/14/21  6:03 PM   Specimen: Nasopharyngeal Swab; Nasopharyngeal(NP) swabs in vial transport medium  Result Value Ref Range Status   SARS Coronavirus 2 by RT PCR NEGATIVE NEGATIVE Final    Comment: (NOTE) SARS-CoV-2 target nucleic acids are NOT DETECTED.  The SARS-CoV-2 RNA is generally detectable in upper respiratory specimens during the acute phase of infection. The lowest concentration of SARS-CoV-2 viral copies this assay can detect is 138 copies/mL. A negative result does not preclude SARS-Cov-2 infection and should not be used as the sole basis for treatment or other patient management decisions. A negative result may occur with  improper specimen collection/handling, submission of specimen other than nasopharyngeal swab, presence of viral mutation(s) within the areas targeted by this assay, and inadequate number of viral copies(<138 copies/mL). A negative result must be combined with clinical observations, patient history, and epidemiological information. The expected result is Negative.  Fact Sheet for Patients:  EntrepreneurPulse.com.au  Fact Sheet for Healthcare Providers:  IncredibleEmployment.be  This test is no t yet approved or cleared by the [place] FDA and  has been authorized for detection and/or diagnosis of SARS-CoV-2 by FDA under an Emergency Use Authorization (EUA). This EUA will remain  in effect (meaning this test can be used) for the duration of the COVID-19 declaration under Section 564(b)(1) of the Act, 21 U.S.C.section 360bbb-3(b)(1), unless the authorization is terminated  or revoked sooner.       Influenza A by PCR NEGATIVE NEGATIVE Final   Influenza B by PCR NEGATIVE NEGATIVE Final    Comment: (NOTE) The Xpert Xpress SARS-CoV-2/FLU/RSV plus assay is intended as an aid in the diagnosis of influenza from Nasopharyngeal swab specimens and should not be used as a sole  basis for treatment. Nasal washings and aspirates are unacceptable for Xpert Xpress SARS-CoV-2/FLU/RSV testing.  Fact Sheet for Patients: EntrepreneurPulse.com.au  Fact Sheet for Healthcare Providers: IncredibleEmployment.be  This test is not yet approved or cleared by the [place] FDA  and has been authorized for detection and/or diagnosis of SARS-CoV-2 by FDA under an Emergency Use Authorization (EUA). This EUA will remain in effect (meaning this test can be used) for the duration of the COVID-19 declaration under Section 564(b)(1) of the Act, 21 U.S.C. section 360bbb-3(b)(1), unless the authorization is terminated or revoked.  Performed at [facility], [address] [place], [place] [address]      Labs: BNP (last 3 results) No results for input(s): BNP in the last 8760 hours. Basic Metabolic Panel: Recent Labs  Lab 06/14/21 1346  NA 138  K 3.3*  CL 98  CO2 29  GLUCOSE 185*  BUN 12  CREATININE 0.90  CALCIUM 8.8*   Liver Function Tests: Recent Labs  Lab 06/14/21 1346  AST 28  ALT 29  ALKPHOS 88  BILITOT 0.6  PROT 7.7  ALBUMIN 3.6   No results for input(s): LIPASE, AMYLASE in the last 168 hours. No results for input(s): AMMONIA in the last 168 hours. CBC: Recent Labs  Lab 06/14/21 1346  WBC 7.0  NEUTROABS 3.1  HGB 13.8  HCT 42.2  MCV 94.4  PLT 273   Cardiac Enzymes: No results for input(s): CKTOTAL, CKMB, CKMBINDEX, TROPONINI in the last 168 hours. BNP: Invalid input(s): POCBNP CBG: Recent Labs  Lab 06/14/21 1358  GLUCAP 200*   D-Dimer No results for input(s): DDIMER in the last 72 hours. Hgb A1c No results for input(s): HGBA1C in the last 72 hours. Lipid Profile Recent Labs    06/15/21 0523  CHOL 132  HDL 28*  LDLCALC 56  TRIG 240*  CHOLHDL 4.7   Thyroid function studies No results for input(s): TSH, T4TOTAL, T3FREE, THYROIDAB in the last 72 hours.  Invalid input(s): FREET3 Anemia work  up Recent Labs    06/15/21 0523  TIBC 329  IRON 61   Urinalysis    Component Value Date/Time   COLORURINE YELLOW 06/14/2021 [place] 06/14/2021 1358   APPEARANCEUR Clear 07/05/2017 1552   LABSPEC <1.005 (L) 06/14/2021 1358   PHURINE 6.0 06/14/2021 1358   GLUCOSEU NEGATIVE 06/14/2021 1358   HGBUR TRACE (A) 06/14/2021 1358   BILIRUBINUR NEGATIVE 06/14/2021 1358   BILIRUBINUR Negative 07/05/2017 1552   KETONESUR NEGATIVE 06/14/2021 1358   PROTEINUR NEGATIVE 06/14/2021 1358   UROBILINOGEN negative 06/01/2013 1142   UROBILINOGEN 0.2 04/08/2012 0045   NITRITE NEGATIVE 06/14/2021 1358   LEUKOCYTESUR SMALL (A) 06/14/2021 1358   Sepsis Labs Invalid input(s): PROCALCITONIN,  WBC,  LACTICIDVEN Microbiology Recent Results (from the past 240 hour(s))  Resp Panel by RT-PCR (Flu A&B, Covid) Nasopharyngeal Swab     Status: None   Collection Time: 06/14/21  6:03 PM   Specimen: Nasopharyngeal Swab; Nasopharyngeal(NP) swabs in vial transport medium  Result Value Ref Range Status   SARS Coronavirus 2 by RT PCR NEGATIVE NEGATIVE Final    Comment: (NOTE) SARS-CoV-2 target nucleic acids are NOT DETECTED.  The SARS-CoV-2 RNA is generally detectable in upper respiratory specimens during the acute phase of infection. The lowest concentration of SARS-CoV-2 viral copies this assay can detect is 138 copies/mL. A negative result does not preclude SARS-Cov-2 infection and should not be used as the sole basis for treatment or other patient management decisions. A negative result may occur with  improper specimen collection/handling, submission of specimen other than nasopharyngeal swab, presence of viral mutation(s) within the areas targeted by this assay, and inadequate number of viral copies(<138 copies/mL). A negative result must be combined with clinical observations, patient  history, and epidemiological information. The expected result is Negative.  Fact Sheet for Patients:   EntrepreneurPulse.com.au  Fact Sheet for Healthcare Providers:  IncredibleEmployment.be  This test is no t yet approved or cleared by the [place] FDA and  has been authorized for detection and/or diagnosis of SARS-CoV-2 by FDA under an Emergency Use Authorization (EUA). This EUA will remain  in effect (meaning this test can be used) for the duration of the COVID-19 declaration under Section 564(b)(1) of the Act, 21 U.S.C.section 360bbb-3(b)(1), unless the authorization is terminated  or revoked sooner.       Influenza A by PCR NEGATIVE NEGATIVE Final   Influenza B by PCR NEGATIVE NEGATIVE Final    Comment: (NOTE) The Xpert Xpress SARS-CoV-2/FLU/RSV plus assay is intended as an aid in the diagnosis of influenza from Nasopharyngeal swab specimens and should not be used as a sole basis for treatment. Nasal washings and aspirates are unacceptable for Xpert Xpress SARS-CoV-2/FLU/RSV testing.  Fact Sheet for Patients: EntrepreneurPulse.com.au  Fact Sheet for Healthcare Providers: IncredibleEmployment.be  This test is not yet approved or cleared by the [place] FDA and has been authorized for detection and/or diagnosis of SARS-CoV-2 by FDA under an Emergency Use Authorization (EUA). This EUA will remain in effect (meaning this test can be used) for the duration of the COVID-19 declaration under Section 564(b)(1) of the Act, 21 U.S.C. section 360bbb-3(b)(1), unless the authorization is terminated or revoked.  Performed at [facility], [address], [place], [place] [address]    Time coordinating discharge:   SIGNED:  Irwin Brakeman, MD  [facility] 06/15/2021, 2:32 PM How to contact the [facility] Attending or Consulting provider [place] or covering provider during after hours [place], for this patient?  Check the care team in [facility] and look for a) attending/consulting TR[facility] provider listed and b)  the [facility] team listed Log into [email or web address] and use [place] universal password to access. If you do not have the password, please contact the hospital operator. Locate the [facility] provider you are looking for under [facility] and page to a number that you can be directly reached. If you still have difficulty reaching the provider, please page the [facility] (Director on Call) for the Hospitalists listed on amion for assistance.

## 2021-06-15 NOTE — Progress Notes (Signed)
SLP Cancellation Note  Patient Details Name: GINELLE BAYS MRN: [id number] DOB: [birth year]   Cancelled treatment:       Reason Eval/Treat Not Completed: Patient at procedure or test/unavailable; pt in MRI when evaluation attempted; ST will continue efforts.   Elvina Sidle, M.S., [place] 06/15/2021, 12:22 PM

## 2021-06-15 NOTE — Evaluation (Addendum)
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   Mixed conductive and sensorineural hearing loss of both ears 01/27/2018   Statin-induced myositis 08/09/2017   Obesity, Class III, BMI 40-49.9 (morbid obesity) ([place]) 10/28/2016   Primary hyperparathyroidism ([place]) 05/06/2015   Type 2 diabetes mellitus with other specified complication (HCC)    GERD (gastroesophageal reflux disease) 11/05/2013   Fatty liver 06/01/2013   Hypercalcemia 05/14/2013   Abnormal transaminases 05/14/2013   Hot flashes 05/14/2013   Hypertension associated with diabetes ([place])    Hypothyroid    Migraine    Polio    Amnesia    Seasonal allergies    Vitamin D deficiency    Aphasia 04/11/2012   OBSTRUCTIVE SLEEP APNEA 05/31/2008   Hyperlipidemia associated with type 2 diabetes mellitus ([place]) 05/31/2008   OVARIAN CYST 05/31/2008   Past Medical History:  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   age 14--  pt states was quarantined   no treatment ;  per pt no symptoms or issues since   History of kidney stones    History of TIA (transient ischemic attack)    03-23-2014  w/ episode temporay amnesia   Hyperlipidemia    Hypertension    Hypothyroidism    Iron deficiency anemia    LAFB  (left anterior fascicular block)    OA (osteoarthritis)    OSA on CPAP    severe per study 2003   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   Post-polio limb muscle weakness    left leg  w/ 3 repair surgery's   RBBB (right bundle branch block)    Seasonal allergies    Type 2 diabetes mellitus ([place])    Vitamin D deficiency    Past Surgical History:  Past Surgical History:  Procedure Laterality Date   CARPAL TUNNEL RELEASE Right 05-10-2007   CHOLECYSTECTOMY OPEN  1985   and Appendectomy   CYSTO/  RIGHT URETEROSCOPIC STONE EXTRACTON/  STENT PLACEMENT  06/ 2016   in [place]   CYSTOSCOPY/RETROGRADE/URETEROSCOPY/STONE EXTRACTION WITH BASKET Right 11/17/2015   Procedure: CYSTOSCOPY/RETROGRADE PYELOGRAM RIGHT/DIAGNOSTIC RIGHT URETEROSCOPY/LASER RENAL CALCIFICATION/RIGHT STENT PLACEMENT;  Surgeon: Cleon Gustin, MD;  Location: [facility];  Service: Urology;  Laterality: Right;   ESOPHAGOGASTRODUODENOSCOPY N/A 08/29/2015   Procedure: ESOPHAGOGASTRODUODENOSCOPY (EGD);  Surgeon: Danie Binder, MD;  [facility];  Service: Endoscopy;  Laterality: N/A;   HOLMIUM LASER APPLICATION Right 7/86/7672   Procedure: HOLMIUM LASER APPLICATION;  Surgeon: Cleon Gustin, MD;  Location: [facility];  Service: Urology;  Laterality: Right;   KNEE ARTHROSCOPY Right [place];  1985;  1995   polio   LUMBAR SPINE SURGERY  09-01-2010   L4 -L5 fusion,  L3 laminectomy,  L5 - S1 foraminotomy   OVARIAN CYST SURGERY Right 1966   PARATHYROIDECTOMY N/A 05/06/2015   Procedure: PARATHYROIDECTOMY;  Surgeon: Armandina Gemma, MD;  Location: [facility];  Service: General;  Laterality: N/A;   left superior   TONSILLECTOMY  as child   TOTAL ABDOMINAL HYSTERECTOMY W/ BILATERAL SALPINGOOPHORECTOMY  1984   TOTAL KNEE ARTHROPLASTY Right 04-08-2008   TRANSTHORACIC ECHOCARDIOGRAM  04-07-2012   grade 1 diastolic function,  ef 83-41%/  mild AV  calcification without stenosis/  trivial MR   HPI:  81 y.o. female with medical history significant for hypertension, diabetes mellitus, OSA.  Patient was brought to the ED via EMS with reports of aphasia and confusion.  At the time of my evaluation patient is awake alert and oriented x4 and able to provide history.  Patient was at church Sunday school when suddenly reports patient was  confused and had trouble speaking, and she said that her hearing aids were coming out of her nose.  Patient tells me she does not remember the exact details of all that happened so she must have been confused, but she states she could not hear what she was being told asked.  She tells me her speech sounds normal now.  There was no report of facial asymmetry, has mild chronic unchanged numbness to her bilateral feet, nothing new, no weakness of her extremities.  When patient had a TIA in the past , at that time she had similar symptoms; 06/15/21 MRI head indicated No acute infarction, hemorrhage, or mass.     No large vessel occlusion, hemodynamically significant stenosis, or  evidence of dissection; SLE generated.   Assessment / Plan / Recommendation Clinical Impression  Pt seen for speech/language cognitive evaluation with speech 100% intelligible within conversation and cognitive/linguistic skills appear WFL with a score of 28/30 obtained on the [place] Status Examination (SLUMS) with 27/30 being considered normal.  Pt recalled 3/5 objects with a time delay, but recalled all 5 with category cue, so this is pt's baseline per report, so not a new occurrence.  No further ST intervention warranted.  Thank you for this consult.    SLP Assessment  SLP Recommendation/Assessment: Patient does not need any further Speech Language Pathology Services SLP Visit Diagnosis: Cognitive communication deficit (R41.841)    Recommendations for follow up therapy are one component of a multi-disciplinary discharge planning  process, led by the attending physician.  Recommendations may be updated based on patient status, additional functional criteria and insurance authorization.    Follow Up Recommendations  No SLP follow up    Assistance Recommended at Discharge  Intermittent Supervision/Assistance  Functional Status Assessment Patient has not had a recent decline in their functional status  Frequency and Duration   Evaluation only        SLP Evaluation Cognition  Overall Cognitive Status: Within Functional Limits for tasks assessed Arousal/Alertness: Awake/alert Orientation Level: Oriented X4 Year: 2023 Month: February Day of Week: Correct Attention: Sustained Sustained Attention: Appears intact Memory: Impaired Memory Impairment: Decreased recall of new information Immediate Memory Recall: Sock;Blue;Bed Memory Recall Sock: Without Cue Memory Recall Blue: Without Cue Memory Recall Bed: Without Cue Awareness: Appears intact Problem Solving: Appears intact Safety/Judgment: Appears intact       Comprehension  Auditory Comprehension Overall Auditory Comprehension: Appears within functional limits for tasks assessed Yes/No Questions: Within Functional Limits Commands: Within Functional  Limits Conversation: Complex Environmental consultant Discrimination: Within Function Limits Reading Comprehension Reading Status: Within funtional limits    Expression Expression Primary Mode of Expression: Verbal Verbal Expression Overall Verbal Expression: Appears within functional limits for tasks assessed Level of Generative/Spontaneous Verbalization: Conversation Repetition: No impairment Naming: No impairment Pragmatics: No impairment Non-Verbal Means of Communication: Not applicable Written Expression Dominant Hand: Right Written Expression: Within Functional Limits   Oral / Motor  Oral Motor/Sensory Function Overall Oral Motor/Sensory Function: Within functional limits Motor  Speech Overall Motor Speech: Appears within functional limits for tasks assessed Respiration: Within functional limits Phonation: Normal Resonance: Within functional limits Articulation: Within functional limitis Intelligibility: Intelligible Motor Planning: Witnin functional limits Motor Speech Errors: Not applicable            Elvina Sidle, M.S., CCC-SLP 06/15/2021, 1:53 PM

## 2021-06-15 NOTE — Discharge Summary (Signed)
Physician Discharge Summary  Karen Potter:[id number] DOB: 06-14-1940 DOA: 06/14/2021   PCP: Dettinger, Fransisca Kaufmann, MD Neurologist: [facility]   Admit date: 06/14/2021 Discharge date: 06/15/2021   Admitted From:   HOME  Disposition: HOME    Recommendations for Outpatient Follow-up:  Follow up with PCP in 1 weeks Follow-up with [facility] neurology in 2 to 3 weeks Instructions given for patient to take aspirin 81 mg daily with Plavix for 21 days followed by Plavix alone.  Please follow up 2D echocardiogram still pending at time of discharge. Please follow up final urine culture.    Discharge Condition: STABLE   CODE STATUS: FULL  DIET: HEART HEALTHY     Brief Hospitalization Summary: Please see all hospital notes, images, labs for full details of the hospitalization. 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  Patient was brought to the ED via EMS with reports of aphasia and confusion.  At the time of my evaluation patient is awake alert and oriented x4 and able to provide history.  Patient was at church Sunday school when suddenly reports patient was  confused and had trouble speaking, and she said that her hearing aids were coming out of her nose. Patient tells me she does not remember the exact details of all that happened so she must have been confused, but she states she could not hear what she was being told asked.  She tells me her speech sounds normal now.  There was no report of facial asymmetry, has mild chronic unchanged numbness to her bilateral feet, nothing new, no weakness of her extremities.  When patient had a TIA in the past , at that time she had similar symptoms.  She reports compliance with aspirin 325 mg daily.   She reports urinary frequency without dysuria over the past few days.  Otherwise denies any other complaints.   ED Course: Symptoms are resolved by the time patient  arrived to the ED.  Stable vitals.  Blood alcohol level less than 10, UA with small leukocytes many bacteria.  EKG shows sinus rhythm with PVCs.  Head CT was without acute abnormality.  IV ceftriaxone started.  Teleneurology consulted, stroke work-up recommended.   Hospital course Patient was admitted for presumed TIA and had a full work-up including an MRI MRA head and neck and fortunately we did not have any findings of acute ischemic infarct she was seen by telemetry neurology and recommendations were for her to have a full stroke work-up including changing her medications to include aspirin 81 mg daily with Plavix for 21 days followed by Plavix alone.  Her LDL cholesterol was 56 and statin was not started.  She had normal iron studies.  She did have findings of UTI which was treated with ceftriaxone and cefdinir.  A 2D echocardiogram was ordered but is still pending at time of discharge.  She can have it done outpatient when she follows up with her neurologist.  She says that she sees a neurologist at [facility] neurology in [place].  Her exam has been reassuring and her symptoms have completely resolved.  She is stable to discharge home with outpatient follow-up with her primary care provider and neurologist.  Continue CPAP therapy.  Her other medical problems have been stable.  The only change we made to her medications was to discontinue omeprazole due to interaction with Plavix and we have placed her on Protonix for acid reflux symptoms.  Patient is stable to discharge home today.  Her daughter has been updated.   Discharge Diagnoses:  Principal Problem:   TIA (transient ischemic attack) Active Problems:   OBSTRUCTIVE SLEEP APNEA   Aphasia   Hypertension associated with diabetes (HCC)   Hypothyroid   UTI (urinary tract infection)   Discharge Instructions: Discharge Instructions       Ambulatory referral to Neurology   Complete by: As directed      An appointment is requested in  approximately: 2 weeks    [place] TIA         Allergies as of 06/15/2021         Reactions    Codeine Itching    Dilaudid [hydromorphone] Other (See Comments)    Mouth blisters    Other Itching    Most pain meds cause itching.  When has to take pain medication, she has been instructed to take Benadryl    Hydrocodone-acetaminophen Other (See Comments)    Doxycycline Rash, Other (See Comments)            Medication List       STOP taking these medications     amoxicillin-clavulanate 875-125 MG tablet Commonly known as: AUGMENTIN    aspirin 325 MG tablet Replaced by: aspirin EC 81 MG tablet    brompheniramine-pseudoephedrine-DM 30-2-10 MG/5ML syrup    hydrocortisone 1 % lotion    naproxen 500 MG tablet Commonly known as: NAPROSYN    omeprazole 20 MG capsule Commonly known as: PRILOSEC           TAKE these medications     acetaminophen 650 MG CR tablet Commonly known as: TYLENOL Take 650 mg by mouth in the morning.    albuterol 108 (90 Base) MCG/ACT inhaler Commonly known as: VENTOLIN HFA Inhale 2 puffs into the lungs every 6 (six) hours as needed for wheezing or shortness of breath.    allopurinol 100 MG tablet Commonly known as: ZYLOPRIM Take 1 tablet (100 mg total) by mouth daily.    aspirin EC 81 MG tablet Take 1 tablet (81 mg total) by mouth daily for 21 days. Swallow whole. Replaces: aspirin 325 MG tablet    Blood Glucose Monitor System w/Device Kit Use glucose meter to check blood sugar up to 3 times daily as instructed. One Touch Verio preferred by insurance. Dx: EE11.69    carvedilol 6.25 MG tablet Commonly known as: COREG Take 1 tablet (6.25 mg total) by mouth 2 (two) times daily with a meal.    cefdinir 300 MG capsule Commonly known as: OMNICEF Take 1 capsule (300 mg total) by mouth daily for 3 days.    clopidogrel 75 MG tablet Commonly known as: PLAVIX Take 1 tablet (75 mg total) by mouth every evening.    Elderberry 500 MG  Caps Take 500 mg by mouth daily.    Fish Oil Triple Strength 1400 MG Caps Take 1,000 mg by mouth 4 (four) times daily.    furosemide 40 MG tablet Commonly known as: LASIX Take 1 tablet (40 mg total) by mouth daily. Start taking on: June 16, 2021    Lancets Misc. Kit Use patient & insurance preferred lancet device to test blood glucose up to 3 times daily for Dx: E11.69    levothyroxine 150 MCG tablet Commonly known as: Euthyrox Take 1 tablet (150 mcg total) by mouth daily.    losartan 25 MG tablet Commonly known as: COZAAR Take 1 tablet (25 mg total) by mouth daily.  OneTouch Verio test strip °Generic drug: glucose blood °USE 1 STRIP TO CHECK GLUCOSE UP TO THREE TIMES DAILY AS  INSTRUCTED °   °pantoprazole 40 MG tablet °Commonly known as: Protonix °Take 1 tablet (40 mg total) by mouth daily. °   °Vitamin D3 50 MCG (2000 UT) capsule °Take 2,000 Units by mouth 2 (two) times daily. °   °  °   °  °  Follow-up Information   °  °  Dettinger, Joshua A, MD. Schedule an appointment as soon as possible for a visit in 1 week(s).   °Specialties: Family Medicine, Cardiology °Why: Hospital Follow Up °Contact information: °[address] °[place] [address] [phone] °  °  °   °  °  °  Hochrein, James, MD .   °Specialty: Cardiology °Contact information: °[address] °[address] °[place] [address] [phone] °  °  °   °  °  °  [facility]. Schedule an appointment as soon as possible for a visit in 2 week(s).   °Why: Hospital Follow Up °Contact information: °[address]     [address] °[place] [address] [phone] °  °   °  °  °   °  °  °   °  °     °Allergies  °Allergen Reactions  ° Codeine Itching  ° Dilaudid [Hydromorphone] Other (See Comments)  °    Mouth blisters  ° Other Itching  °    Most pain meds cause itching.  When has to take pain medication, she has been instructed to take Benadryl  ° Hydrocodone-Acetaminophen Other (See Comments)  °  Doxycycline Rash and Other (See Comments)  °  °Allergies as of 06/15/2021   °  °    Reactions  °  Codeine Itching  °  Dilaudid [hydromorphone] Other (See Comments)  °  Mouth blisters  °  Other Itching  °  Most pain meds cause itching.  When has to take pain medication, she has been instructed to take Benadryl  °  Hydrocodone-acetaminophen Other (See Comments)  °  Doxycycline Rash, Other (See Comments)  °  °   °  °   °Medication List  °   °  °STOP taking these medications   °  °amoxicillin-clavulanate 875-125 MG tablet °Commonly known as: AUGMENTIN °   °aspirin 325 MG tablet °Replaced by: aspirin EC 81 MG tablet °   °brompheniramine-pseudoephedrine-DM 30-2-10 MG/5ML syrup °   °hydrocortisone 1 % lotion °   °naproxen 500 MG tablet °Commonly known as: NAPROSYN °   °omeprazole 20 MG capsule °Commonly known as: PRILOSEC °   °  °   °  °TAKE these medications   °  °acetaminophen 650 MG CR tablet °Commonly known as: TYLENOL °Take 650 mg by mouth in the morning. °   °albuterol 108 (90 Base) MCG/ACT inhaler °Commonly known as: VENTOLIN HFA °Inhale 2 puffs into the lungs every 6 (six) hours as needed for wheezing or shortness of breath. °   °allopurinol 100 MG tablet °Commonly known as: ZYLOPRIM °Take 1 tablet (100 mg total) by mouth daily. °   °aspirin EC 81 MG tablet °Take 1 tablet (81 mg total) by mouth daily for 21 days. Swallow whole. °Replaces: aspirin 325 MG tablet °   °Blood Glucose Monitor System w/Device Kit °Use glucose meter to check blood sugar up to 3 times daily as instructed. One Touch Verio preferred by insurance. Dx: EE11.69 °   °  carvedilol 6.25 MG tablet °Commonly known as: COREG °Take 1 tablet (6.25 mg total) by mouth 2 (two) times daily with a meal. °   °cefdinir 300 MG capsule °Commonly known as: OMNICEF °Take 1 capsule (300 mg total) by mouth daily for 3 days. °   °clopidogrel 75 MG tablet °Commonly known as: PLAVIX °Take 1 tablet (75 mg total) by mouth every evening. °   °Elderberry 500 MG Caps °Take 500  mg by mouth daily. °   °Fish Oil Triple Strength 1400 MG Caps °Take 1,000 mg by mouth 4 (four) times daily. °   °furosemide 40 MG tablet °Commonly known as: LASIX °Take 1 tablet (40 mg total) by mouth daily. °Start taking on: June 16, 2021 °   °Lancets Misc. Kit °Use patient & insurance preferred lancet device to test blood glucose up to 3 times daily for Dx: E11.69 °   °levothyroxine 150 MCG tablet °Commonly known as: Euthyrox °Take 1 tablet (150 mcg total) by mouth daily. °   °losartan 25 MG tablet °Commonly known as: COZAAR °Take 1 tablet (25 mg total) by mouth daily. °   °OneTouch Verio test strip °Generic drug: glucose blood °USE 1 STRIP TO CHECK GLUCOSE UP TO THREE TIMES DAILY AS  INSTRUCTED °   °pantoprazole 40 MG tablet °Commonly known as: Protonix °Take 1 tablet (40 mg total) by mouth daily. °   °Vitamin D3 50 MCG (2000 UT) capsule °Take 2,000 Units by mouth 2 (two) times daily. °   °  °   °  °  °Procedures/Studies: °Imaging Results  °DG Chest 2 View °  °Result Date: 05/18/2021 °CLINICAL DATA:  80-year-old female with diminishing lungs sounds EXAM: CHEST - 2 VIEW COMPARISON:  08/17/2019 FINDINGS: Cardiomediastinal silhouette unchanged in size and contour. No evidence of central vascular congestion. No interlobular septal thickening. Asymmetric elevation of the right hemidiaphragm, unchanged. Low lung volumes. No pneumothorax or pleural effusion. Coarsened interstitial markings, with no confluent airspace disease. No acute displaced fracture. Degenerative changes of the spine. IMPRESSION: Negative for acute cardiopulmonary disease Electronically Signed   By: Jaime  Wagner D.O.   On: 05/18/2021 14:44  °  °CT HEAD WO CONTRAST °  °Result Date: 06/14/2021 °CLINICAL DATA:  TIA.  Confusion.  Difficulty speaking. EXAM: CT HEAD WITHOUT CONTRAST TECHNIQUE: Contiguous axial images were obtained from the base of the skull through the vertex without intravenous contrast. RADIATION DOSE REDUCTION: This exam was performed  according to the departmental dose-optimization program which includes automated exposure control, adjustment of the mA and/or kV according to patient size and/or use of iterative reconstruction technique. COMPARISON:  April 08, 2012 FINDINGS: Brain: No evidence of acute infarction, hemorrhage, hydrocephalus, extra-axial collection or mass lesion/mass effect. Vascular: No hyperdense vessel or unexpected calcification. Skull: Normal. Negative for fracture or focal lesion. Sinuses/Orbits: No acute finding. Other: None. IMPRESSION: No acute intracranial abnormalities. Electronically Signed   By: David  Williams III M.D.   On: 06/14/2021 15:17  °  °MR ANGIO HEAD WO CONTRAST °  °Result Date: 06/15/2021 °CLINICAL DATA:  Transient aphasia, confusion EXAM: MRI HEAD WITHOUT CONTRAST MRA HEAD WITHOUT CONTRAST MRA NECK WITHOUT AND WITH CONTRAST TECHNIQUE: Multiplanar, multi-echo pulse sequences of the brain and surrounding structures were acquired without intravenous contrast. Angiographic images of the Circle of Willis were acquired using MRA technique without intravenous contrast. Angiographic images of the neck were acquired using MRA technique without and with intravenous contrast. Carotid stenosis measurements (when applicable) are obtained utilizing NASCET criteria, using the distal   internal carotid diameter as the denominator. CONTRAST:  10mL GADAVIST GADOBUTROL 1 MMOL/ML IV SOLN COMPARISON:  MRI brain 2020 FINDINGS: MRI HEAD Brain: There is no acute infarction or intracranial hemorrhage. There is no intracranial mass, mass effect, or edema. There is no hydrocephalus or extra-axial fluid collection. Ventricles and sulci are within normal limits in size and configuration Vascular: Major vessel flow voids at the skull base are preserved. Skull and upper cervical spine: Normal marrow signal is preserved. Sinuses/Orbits: Paranasal sinuses are aerated. Bilateral lens replacements. Other: Sella is unremarkable. Patchy left  greater than right mastoid fluid opacification. MRA HEAD Intracranial internal carotid arteries are patent. Middle and anterior cerebral arteries are patent. Intracranial vertebral arteries, basilar artery, posterior cerebral arteries are patent. There is no significant stenosis or aneurysm. MRA NECK Great vessel origins are patent. Common, internal, and external carotid arteries are patent. Codominant extracranial vertebral arteries are patent. No hemodynamically significant stenosis or evidence of dissection. IMPRESSION: No acute infarction, hemorrhage, or mass. No large vessel occlusion, hemodynamically significant stenosis, or evidence of dissection. Electronically Signed   By: Praneil  Patel M.D.   On: 06/15/2021 12:52  °  °MR ANGIO NECK W WO CONTRAST °  °Result Date: 06/15/2021 °CLINICAL DATA:  Transient aphasia, confusion EXAM: MRI HEAD WITHOUT CONTRAST MRA HEAD WITHOUT CONTRAST MRA NECK WITHOUT AND WITH CONTRAST TECHNIQUE: Multiplanar, multi-echo pulse sequences of the brain and surrounding structures were acquired without intravenous contrast. Angiographic images of the Circle of Willis were acquired using MRA technique without intravenous contrast. Angiographic images of the neck were acquired using MRA technique without and with intravenous contrast. Carotid stenosis measurements (when applicable) are obtained utilizing NASCET criteria, using the distal internal carotid diameter as the denominator. CONTRAST:  10mL GADAVIST GADOBUTROL 1 MMOL/ML IV SOLN COMPARISON:  MRI brain 2020 FINDINGS: MRI HEAD Brain: There is no acute infarction or intracranial hemorrhage. There is no intracranial mass, mass effect, or edema. There is no hydrocephalus or extra-axial fluid collection. Ventricles and sulci are within normal limits in size and configuration Vascular: Major vessel flow voids at the skull base are preserved. Skull and upper cervical spine: Normal marrow signal is preserved. Sinuses/Orbits: Paranasal sinuses  are aerated. Bilateral lens replacements. Other: Sella is unremarkable. Patchy left greater than right mastoid fluid opacification. MRA HEAD Intracranial internal carotid arteries are patent. Middle and anterior cerebral arteries are patent. Intracranial vertebral arteries, basilar artery, posterior cerebral arteries are patent. There is no significant stenosis or aneurysm. MRA NECK Great vessel origins are patent. Common, internal, and external carotid arteries are patent. Codominant extracranial vertebral arteries are patent. No hemodynamically significant stenosis or evidence of dissection. IMPRESSION: No acute infarction, hemorrhage, or mass. No large vessel occlusion, hemodynamically significant stenosis, or evidence of dissection. Electronically Signed   By: Praneil  Patel M.D.   On: 06/15/2021 12:52  °  °MR BRAIN WO CONTRAST °  °Result Date: 06/15/2021 °CLINICAL DATA:  Transient aphasia, confusion EXAM: MRI HEAD WITHOUT CONTRAST MRA HEAD WITHOUT CONTRAST MRA NECK WITHOUT AND WITH CONTRAST TECHNIQUE: Multiplanar, multi-echo pulse sequences of the brain and surrounding structures were acquired without intravenous contrast. Angiographic images of the Circle of Willis were acquired using MRA technique without intravenous contrast. Angiographic images of the neck were acquired using MRA technique without and with intravenous contrast. Carotid stenosis measurements (when applicable) are obtained utilizing NASCET criteria, using the distal internal carotid diameter as the denominator. CONTRAST:  10mL GADAVIST GADOBUTROL 1 MMOL/ML IV SOLN COMPARISON:  MRI brain 2020 FINDINGS: MRI HEAD Brain:   There is no acute infarction or intracranial hemorrhage. There is no intracranial mass, mass effect, or edema. There is no hydrocephalus or extra-axial fluid collection. Ventricles and sulci are within normal limits in size and configuration Vascular: Major vessel flow voids at the skull base are preserved. Skull and upper cervical  spine: Normal marrow signal is preserved. Sinuses/Orbits: Paranasal sinuses are aerated. Bilateral lens replacements. Other: Sella is unremarkable. Patchy left greater than right mastoid fluid opacification. MRA HEAD Intracranial internal carotid arteries are patent. Middle and anterior cerebral arteries are patent. Intracranial vertebral arteries, basilar artery, posterior cerebral arteries are patent. There is no significant stenosis or aneurysm. MRA NECK Great vessel origins are patent. Common, internal, and external carotid arteries are patent. Codominant extracranial vertebral arteries are patent. No hemodynamically significant stenosis or evidence of dissection. IMPRESSION: No acute infarction, hemorrhage, or mass. No large vessel occlusion, hemodynamically significant stenosis, or evidence of dissection. Electronically Signed   By: Macy Mis M.D.   On: 06/15/2021 12:52       Subjective: Patient reports she is feeling much better.  She has had no recurrence of her symptoms.  Her speech has been normal.  She is thinking clearly and she is having no weakness.   Discharge Exam:     Vitals:    06/15/21 0458 06/15/21 1228  BP: 110/62 105/65  Pulse: 78 73  Resp: 18 18  Temp: 98.1 F (36.7 C) 98.1 F (36.7 C)  SpO2: 94% 97%          Vitals:    06/15/21 0001 06/15/21 0106 06/15/21 0458 06/15/21 1228  BP: 118/75   110/62 105/65  Pulse: 74 80 78 73  Resp: _0 Temp: 98.4 F (36.9 C)   98.1 F (36.7 C) 98.1 F (36.7 C)  TempSrc: Oral   Oral    SpO2: 97%   94% 97%  Weight:          Height:            General: Pt is alert, awake, not in acute distress Cardiovascular: RRR, S1/S2 +, no rubs, no gallops Respiratory: CTA bilaterally, no wheezing, no rhonchi Abdominal: Soft, NT, ND, bowel sounds + Extremities: no edema, no cyanosis Neurological: Full exam completed no focal findings.   The results of significant diagnostics from this hospitalization (including imaging,  microbiology, ancillary and laboratory) are listed below for reference.       Microbiology:        Recent Results (from the past 240 hour(s))  Resp Panel by RT-PCR (Flu A&B, Covid) Nasopharyngeal Swab     Status: None    Collection Time: 06/14/21  6:03 PM    Specimen: Nasopharyngeal Swab; Nasopharyngeal(NP) swabs in vial transport medium  Result Value Ref Range Status    SARS Coronavirus 2 by RT PCR NEGATIVE NEGATIVE Final      Comment: (NOTE) SARS-CoV-2 target nucleic acids are NOT DETECTED.   The SARS-CoV-2 RNA is generally detectable in upper respiratory specimens during the acute phase of infection. The lowest concentration of SARS-CoV-2 viral copies this assay can detect is 138 copies/mL. A negative result does not preclude SARS-Cov-2 infection and should not be used as the sole basis for treatment or other patient management decisions. A negative result may occur with  improper specimen collection/handling, submission of specimen other than nasopharyngeal swab, presence of viral mutation(s) within the areas targeted by this assay, and inadequate number of viral copies(<138 copies/mL). A negative result must be combined with  clinical observations, patient history, and epidemiological information. The expected result is Negative.   Fact Sheet for Patients:  EntrepreneurPulse.com.au   Fact Sheet for Healthcare Providers:  IncredibleEmployment.be   This test is no t yet approved or cleared by the [place] FDA and  has been authorized for detection and/or diagnosis of SARS-CoV-2 by FDA under an Emergency Use Authorization (EUA). This EUA will remain  in effect (meaning this test can be used) for the duration of the COVID-19 declaration under Section 564(b)(1) of the Act, 21 U.S.C.section 360bbb-3(b)(1), unless the authorization is terminated  or revoked sooner.           Influenza A by PCR NEGATIVE NEGATIVE Final    Influenza B by  PCR NEGATIVE NEGATIVE Final      Comment: (NOTE) The Xpert Xpress SARS-CoV-2/FLU/RSV plus assay is intended as an aid in the diagnosis of influenza from Nasopharyngeal swab specimens and should not be used as a sole basis for treatment. Nasal washings and aspirates are unacceptable for Xpert Xpress SARS-CoV-2/FLU/RSV testing.   Fact Sheet for Patients: EntrepreneurPulse.com.au   Fact Sheet for Healthcare Providers: IncredibleEmployment.be   This test is not yet approved or cleared by the [place] FDA and has been authorized for detection and/or diagnosis of SARS-CoV-2 by FDA under an Emergency Use Authorization (EUA). This EUA will remain in effect (meaning this test can be used) for the duration of the COVID-19 declaration under Section 564(b)(1) of the Act, 21 U.S.C. section 360bbb-3(b)(1), unless the authorization is terminated or revoked.   Performed at [facility], [address] [place], [place] [address]        Labs: BNP (last 3 results) Recent Labs (within last 365 days)  No results for input(s): BNP in the last 8760 hours.   Basic Metabolic Panel: Last Labs      Recent Labs  Lab 06/14/21 1346  NA 138  K 3.3*  CL 98  CO2 29  GLUCOSE 185*  BUN 12  CREATININE 0.90  CALCIUM 8.8*      Liver Function Tests: Last Labs      Recent Labs  Lab 06/14/21 1346  AST 28  ALT 29  ALKPHOS 88  BILITOT 0.6  PROT 7.7  ALBUMIN 3.6      Last Labs   No results for input(s): LIPASE, AMYLASE in the last 168 hours.   Last Labs   No results for input(s): AMMONIA in the last 168 hours.   CBC: Last Labs      Recent Labs  Lab 06/14/21 1346  WBC 7.0  NEUTROABS 3.1  HGB 13.8  HCT 42.2  MCV 94.4  PLT 273      Cardiac Enzymes: Last Labs   No results for input(s): CKTOTAL, CKMB, CKMBINDEX, TROPONINI in the last 168 hours.   BNP: Last Labs   Invalid input(s): POCBNP   CBG: Last Labs      Recent Labs  Lab  06/14/21 1358  GLUCAP 200*      D-Dimer Recent Labs (last 2 labs)   No results for input(s): DDIMER in the last 72 hours.   Hgb A1c Recent Labs (last 2 labs)   No results for input(s): HGBA1C in the last 72 hours.   Lipid Profile Recent Labs (last 2 labs)      Recent Labs    06/15/21 0523  CHOL 132  HDL 28*  LDLCALC 56  TRIG 240*  CHOLHDL 4.7      Thyroid function studies  Recent  Labs (last 2 labs)   °No results for input(s): TSH, T4TOTAL, T3FREE, THYROIDAB in the last 72 hours. °  °Invalid input(s): FREET3  ° °Anemia work up °Recent Labs (last 2 labs)   °   °Recent Labs  °  06/15/21 °0523  °TIBC 329  °IRON 61  °  °  °Urinalysis °Labs (Brief)  °     °   °Component Value Date/Time  °  COLORURINE YELLOW 06/14/2021 1358  °  APPEARANCEUR CLEAR 06/14/2021 1358  °  APPEARANCEUR Clear 07/05/2017 1552  °  LABSPEC <1.005 (L) 06/14/2021 1358  °  PHURINE 6.0 06/14/2021 1358  °  GLUCOSEU NEGATIVE 06/14/2021 1358  °  HGBUR TRACE (A) 06/14/2021 1358  °  BILIRUBINUR NEGATIVE 06/14/2021 1358  °  BILIRUBINUR Negative 07/05/2017 1552  °  KETONESUR NEGATIVE 06/14/2021 1358  °  PROTEINUR NEGATIVE 06/14/2021 1358  °  UROBILINOGEN negative 06/01/2013 1142  °  UROBILINOGEN 0.2 04/08/2012 0045  °  NITRITE NEGATIVE 06/14/2021 1358  °  LEUKOCYTESUR SMALL (A) 06/14/2021 1358  °  °  °Sepsis Labs °Last Labs   °Invalid input(s): PROCALCITONIN,  WBC,  LACTICIDVEN  ° °Microbiology °       °Recent Results (from the past 240 hour(s))  °Resp Panel by RT-PCR (Flu A&B, Covid) Nasopharyngeal Swab     Status: None  °  Collection Time: 06/14/21  6:03 PM  °  Specimen: Nasopharyngeal Swab; Nasopharyngeal(NP) swabs in vial transport medium  °Result Value Ref Range Status  °  SARS Coronavirus 2 by RT PCR NEGATIVE NEGATIVE Final  °    Comment: (NOTE) °SARS-CoV-2 target nucleic acids are NOT DETECTED. °  °The SARS-CoV-2 RNA is generally detectable in upper respiratory °specimens during the acute phase of infection. The  lowest °concentration of SARS-CoV-2 viral copies this assay can detect is °138 copies/mL. A negative result does not preclude SARS-Cov-2 °infection and should not be used as the sole basis for treatment or °other patient management decisions. A negative result may occur with  °improper specimen collection/handling, submission of specimen other °than nasopharyngeal swab, presence of viral mutation(s) within the °areas targeted by this assay, and inadequate number of viral °copies(<138 copies/mL). A negative result must be combined with °clinical observations, patient history, and epidemiological °information. The expected result is Negative. °  °Fact Sheet for Patients:  °https://www.fda.gov/media/152166/download °  °Fact Sheet for Healthcare Providers:  °https://www.fda.gov/media/152162/download °  °This test is no t yet approved or cleared by the [place] FDA and  °has been authorized for detection and/or diagnosis of SARS-CoV-2 by °FDA under an Emergency Use Authorization (EUA). This EUA will remain  °in effect (meaning this test can be used) for the duration of the °COVID-19 declaration under Section 564(b)(1) of the Act, 21 °U.S.C.section 360bbb-3(b)(1), unless the authorization is terminated  °or revoked sooner.  °  °  °   °  Influenza A by PCR NEGATIVE NEGATIVE Final  °  Influenza B by PCR NEGATIVE NEGATIVE Final  °    Comment: (NOTE) °The Xpert Xpress SARS-CoV-2/FLU/RSV plus assay is intended as an aid °in the diagnosis of influenza from Nasopharyngeal swab specimens and °should not be used as a sole basis for treatment. Nasal washings and °aspirates are unacceptable for Xpert Xpress SARS-CoV-2/FLU/RSV °testing. °  °Fact Sheet for Patients: °https://www.fda.gov/media/152166/download °  °Fact Sheet for Healthcare Providers: °https://www.fda.gov/media/152162/download °  °This test is not yet approved or cleared by the [place] FDA and °has been authorized for detection and/or diagnosis of   SARS-CoV-2  by °FDA under an Emergency Use Authorization (EUA). This EUA will remain °in effect (meaning this test can be used) for the duration of the °COVID-19 declaration under Section 564(b)(1) of the Act, 21 U.S.C. °section 360bbb-3(b)(1), unless the authorization is terminated or °revoked. °  °Performed at [facility], [address] [place], [place] [address] °   °  °Time coordinating discharge:  °  °SIGNED: °  °Clanford Johnson, MD             °[facility] °06/15/2021, 2:32 PM °How to contact the [facility] Attending or Consulting provider 7A - 7P or covering provider during after hours 7P -7A, for this patient?  °Check the care team in [facility] and look for a) attending/consulting [facility] provider listed and b) the [facility] team listed °Log into [email or web address] and use [facility] universal password to access. If you do not have the password, please contact the hospital operator. °Locate the [facility] provider you are looking for under [facility] and page to a number that you can be directly reached. °If you still have difficulty reaching the provider, please page the DOC (Director on Call) for the Hospitalists listed on amion for assistance. °  °

## 2021-06-15 NOTE — Progress Notes (Signed)
Nsg Discharge Note  Admit Date:  06/14/2021 Discharge date: 06/15/2021   MEHAR SAGEN to be D/C'd Home per MD order.  AVS completed.  Copy for chart, and copy for patient signed, and dated. Patient/caregiver able to verbalize understanding. IV removed, discharge paper work given and reviewed, stroke book given   Discharge Medication: Allergies as of 06/15/2021       Reactions   Codeine Itching   Dilaudid [hydromorphone] Other (See Comments)   Mouth blisters   Other Itching   Most pain meds cause itching.  When has to take pain medication, she has been instructed to take Benadryl   Hydrocodone-acetaminophen Other (See Comments)   Doxycycline Rash, Other (See Comments)        Medication List     STOP taking these medications    amoxicillin-clavulanate 875-125 MG tablet Commonly known as: AUGMENTIN   aspirin 325 MG tablet Replaced by: aspirin EC 81 MG tablet   brompheniramine-pseudoephedrine-DM 30-2-10 MG/5ML syrup   hydrocortisone 1 % lotion   naproxen 500 MG tablet Commonly known as: NAPROSYN   omeprazole 20 MG capsule Commonly known as: PRILOSEC       TAKE these medications    acetaminophen 650 MG CR tablet Commonly known as: TYLENOL Take 650 mg by mouth in the morning.   albuterol 108 (90 Base) MCG/ACT inhaler Commonly known as: VENTOLIN HFA Inhale 2 puffs into the lungs every 6 (six) hours as needed for wheezing or shortness of breath.   allopurinol 100 MG tablet Commonly known as: ZYLOPRIM Take 1 tablet (100 mg total) by mouth daily.   aspirin EC 81 MG tablet Take 1 tablet (81 mg total) by mouth daily for 21 days. Swallow whole. Replaces: aspirin 325 MG tablet   Blood Glucose Monitor System w/Device Kit Use glucose meter to check blood sugar up to 3 times daily as instructed. One Touch Verio preferred by insurance. 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   cefdinir 300 MG capsule Commonly known as: OMNICEF Take 1 capsule (300 mg total) by mouth daily for 3 days.   clopidogrel 75 MG tablet Commonly known as: PLAVIX Take 1 tablet (75 mg total) by mouth every evening.   Elderberry 500 MG Caps Take 500 mg by mouth daily.   Fish Oil Triple Strength 1400 MG Caps Take 1,000 mg by mouth 4 (four) times daily.   furosemide 40 MG tablet Commonly known as: LASIX Take 1 tablet (40 mg total) by mouth daily. Start taking on: June 16, 2021   Lancets Misc. Kit Use patient & insurance preferred lancet device to test blood glucose up to 3 times daily for Dx: E11.69   levothyroxine 150 MCG tablet Commonly known as: Euthyrox Take 1 tablet (150 mcg total) by mouth daily.   losartan 25 MG tablet Commonly known as: COZAAR Take 1 tablet (25 mg total) by mouth daily.   OneTouch Verio test strip Generic drug: glucose blood USE 1 STRIP TO CHECK GLUCOSE UP TO THREE TIMES DAILY AS  INSTRUCTED   pantoprazole 40 MG tablet Commonly known as: Protonix Take 1 tablet (40 mg total) by mouth daily.   Vitamin D3 50 MCG (2000 UT) capsule Take 2,000 Units by mouth 2 (two) times daily.        Discharge Assessment: Vitals:   06/15/21 0458 06/15/21 1228  BP: 110/62 105/65  Pulse: 78 73  Resp: 18 18  Temp: 98.1 F (36.7 C) 98.1 F (36.7 C)  SpO2: 94% 97%   Skin clean, dry and intact without evidence of skin break down, no evidence of skin tears noted. IV catheter discontinued intact. Site without signs and symptoms of complications - no redness or edema noted at insertion site, patient denies c/o pain - only slight tenderness at site.  Dressing with slight pressure applied.  D/c Instructions-Education: Discharge instructions given to patient/family with verbalized understanding. D/c education completed with patient/family including follow up instructions, medication list, d/c activities limitations if indicated, with other d/c instructions  as indicated by MD - patient able to verbalize understanding, all questions fully answered. Patient instructed to return to ED, call 911, or call MD for any changes in condition.  Patient escorted via [place], and D/C home via private auto.  Zenaida Deed, RN 06/15/2021 3:01 PM

## 2021-06-15 NOTE — Progress Notes (Signed)
Patient passed swallow screen.  Given diet per standing order.

## 2021-06-15 NOTE — TOC Progression Note (Signed)
°  Transition of Care [facility]) Screening Note   Patient Details  Name: Karen Potter Date of Birth: [birth year]   [facility]) CM/SW Contact:    Shade Flood, LCSW Phone Number: 06/15/2021, 2:00 PM    Transition of Care Department [facility]) has reviewed patient and no TOC needs have been identified at this time. We will continue to monitor patient advancement through interdisciplinary progression rounds. If new patient transition needs arise, please place a TOC consult.

## 2021-06-15 NOTE — Discharge Instructions (Signed)
INSTRUCTIONS:   TAKE ASPIRIN 81 MG WITH PLAVIX FOR 21 DAYS THEN STOP ASPIRIN BUT CONTINUE TAKING PLAVIX DAILY FOR STROKE PREVENTION.  PLEASE FOLLOW UP WITH YOUR NEUROLOGIST IN 2 WEEKS FOR A RECHECK AND MEDICATION CHECK.    STOP TAKING OMEPRAZOLE BECAUSE IT INTERACTS WITH PLAVIX.  YOU CAN TAKE PANTOPRAZOLE INSTEAD FOR ACID REFLUX.      IMPORTANT INFORMATION: PAY CLOSE ATTENTION   PHYSICIAN DISCHARGE INSTRUCTIONS  Follow with Primary care provider  Dettinger, Fransisca Kaufmann, MD  and other consultants as instructed by your Hospitalist Physician  [place] IF SYMPTOMS COME BACK, WORSEN OR NEW PROBLEM DEVELOPS   Please note: You were cared for by a hospitalist during your hospital stay. Every effort will be made to forward records to your primary care provider.  You can request that your primary care provider send for your hospital records if they have not received them.  Once you are discharged, your primary care physician will handle any further medical issues. Please note that NO REFILLS for any discharge medications will be authorized once you are discharged, as it is imperative that you return to your primary care physician (or establish a relationship with a primary care physician if you do not have one) for your post hospital discharge needs so that they can reassess your need for medications and monitor your lab values.  Please get a complete blood count and chemistry panel checked by your Primary MD at your next visit, and again as instructed by your Primary MD.  Get Medicines reviewed and adjusted: Please take all your medications with you for your next visit with your Primary MD  Laboratory/radiological data: Please request your Primary MD to go over all hospital tests and procedure/radiological results at the follow up, please ask your primary care provider to get all Hospital records sent to his/her office.  In some cases, they will be blood work,  cultures and biopsy results pending at the time of your discharge. Please request that your primary care provider follow up on these results.  If you are diabetic, please bring your blood sugar readings with you to your follow up appointment with primary care.    Please call and make your follow up appointments as soon as possible.    Also Note the following: If you experience worsening of your admission symptoms, develop shortness of breath, life threatening emergency, suicidal or homicidal thoughts you must seek medical attention immediately by calling 911 or calling your MD immediately  if symptoms less severe.  You must read complete instructions/literature along with all the possible adverse reactions/side effects for all the Medicines you take and that have been prescribed to you. Take any new Medicines after you have completely understood and accpet all the possible adverse reactions/side effects.   Do not drive when taking Pain medications or sleeping medications (Benzodiazepines)  Do not take more than prescribed Pain, Sleep and Anxiety Medications. It is not advisable to combine anxiety,sleep and pain medications without talking with your primary care practitioner  Special Instructions: If you have smoked or chewed Tobacco  in the last 2 yrs please stop smoking, stop any regular Alcohol  and or any Recreational drug use.  Wear Seat belts while driving.  Do not drive if taking any narcotic, mind altering or controlled substances or recreational drugs or alcohol.

## 2021-06-16 ENCOUNTER — Telehealth: Payer: Self-pay | Admitting: Family Medicine

## 2021-06-16 ENCOUNTER — Ambulatory Visit (INDEPENDENT_AMBULATORY_CARE_PROVIDER_SITE_OTHER): Payer: PPO

## 2021-06-16 ENCOUNTER — Encounter: Payer: Self-pay | Admitting: Cardiology

## 2021-06-16 ENCOUNTER — Telehealth: Payer: Self-pay

## 2021-06-16 ENCOUNTER — Other Ambulatory Visit: Payer: Self-pay

## 2021-06-16 ENCOUNTER — Ambulatory Visit: Payer: PPO | Admitting: Cardiology

## 2021-06-16 VITALS — BP 156/88 | HR 80 | Ht 64.5 in | Wt 242.8 lb

## 2021-06-16 DIAGNOSIS — G459 Transient cerebral ischemic attack, unspecified: Secondary | ICD-10-CM | POA: Diagnosis not present

## 2021-06-16 DIAGNOSIS — R0609 Other forms of dyspnea: Secondary | ICD-10-CM

## 2021-06-16 DIAGNOSIS — R001 Bradycardia, unspecified: Secondary | ICD-10-CM | POA: Diagnosis not present

## 2021-06-16 NOTE — Telephone Encounter (Signed)
Spoke with patient, appointment scheduled with Dr. Warrick Parisian on 06/18/21, 2:10 pm

## 2021-06-16 NOTE — Patient Instructions (Addendum)
Medication Instructions:  NO CHANGES  *If you need a refill on your cardiac medications before your next appointment, please call your pharmacy*   Lab Work: NOT NEEDED    Testing/Procedures: WILL BE SCHEDULE AT [place] [address] Your physician has requested that you have an echocardiogram. Echocardiography is a painless test that uses sound waves to create images of your heart. It provides your doctor with information about the size and shape of your heart and how well your hearts chambers and valves are working. This procedure takes approximately one hour. There are no restrictions for this procedure.   AND WILL BE MAILED TO YOU 3 TO 5 DAYS  Your physician has recommended that you wear a holter monitor 14 DAY ZIO . Holter monitors are medical devices that record the hearts electrical activity. Doctors most often use these monitors to diagnose arrhythmias. Arrhythmias are problems with the speed or rhythm of the heartbeat. The monitor is a small, portable device. You can wear one while you do your normal daily activities. This is usually used to diagnose what is causing palpitations/syncope (passing out).  Follow-Up: At [facility], you and your health needs are our priority.  As part of our continuing mission to provide you with exceptional heart care, we have created designated Provider Care Teams.  These Care Teams include your primary Cardiologist (physician) and Advanced Practice Providers (APPs -  Physician Assistants and Nurse Practitioners) who all work together to provide you with the care you need, when you need it.     Your next appointment:   12 month(s)  The format for your next appointment:   In Person  Provider:   Minus Breeding, MD    Other Instructions  [place] Monitor Instructions  Your physician has requested you wear a ZIO patch monitor for 14 days.  This is a single patch monitor. [employer] supplies one patch monitor per  enrollment. Additional stickers are not available. Please do not apply patch if you will be having a Nuclear Stress Test,  Echocardiogram, Cardiac CT, MRI, or Chest Xray during the period you would be wearing the  monitor. The patch cannot be worn during these tests. You cannot remove and re-apply the  ZIO XT patch monitor.  Your ZIO patch monitor will be mailed 3 day USPS to your address on file. It may take 3-5 days  to receive your monitor after you have been enrolled.  Once you have received your monitor, please review the enclosed instructions. Your monitor  has already been registered assigning a specific monitor serial # to you.  Billing and Patient Assistance Program Information  We have supplied [employer] with any of your insurance information on file for billing purposes. [employer] offers a sliding scale Patient Assistance Program for patients that do not have  insurance, or whose insurance does not completely cover the cost of the ZIO monitor.  You must apply for the Patient Assistance Program to qualify for this discounted rate.  To apply, please call [employer] at [phone], select option 4, select option 2, ask to apply for  Patient Assistance Program. Theodore Demark will ask your household income, and how many people  are in your household. They will quote your out-of-pocket cost based on that information.  [employer] will also be able to set up a 63-month, interest-free payment plan if needed.  Applying the monitor   Shave hair from upper left chest.  Hold abrader disc by orange tab. Rub abrader in 40  strokes over the upper left chest as  indicated in your monitor instructions.  Clean area with 4 enclosed alcohol pads. Let dry.  Apply patch as indicated in monitor instructions. Patch will be placed under collarbone on left  side of chest with arrow pointing upward.  Rub patch adhesive wings for 2 minutes. Remove white label marked "1". Remove the white  label marked "2". Rub patch  adhesive wings for 2 additional minutes.  While looking in a mirror, press and release button in center of patch. A small green light will  flash 3-4 times. This will be your only indicator that the monitor has been turned on.  Do not shower for the first 24 hours. You may shower after the first 24 hours.  Press the button if you feel a symptom. You will hear a small click. Record Date, Time and  Symptom in the Patient Logbook.  When you are ready to remove the patch, follow instructions on the last 2 pages of Patient  Logbook. Stick patch monitor onto the last page of Patient Logbook.  Place Patient Logbook in the blue and white box. Use locking tab on box and tape box closed  securely. The blue and white box has prepaid postage on it. Please place it in the mailbox as  soon as possible. Your physician should have your test results approximately 7 days after the  monitor has been mailed back to [facility].  Call [place] at [phone] if you have questions regarding  your ZIO XT patch monitor. Call them immediately if you see an orange light blinking on your  monitor.  If your monitor falls off in less than 4 days, contact our Monitor department at [phone].  If your monitor becomes loose or falls off after 4 days call [employer] at [phone] for  suggestions on securing your monitor

## 2021-06-16 NOTE — Progress Notes (Unsigned)
Enrolled patient for a 14 day Zio XT  monitor to be mailed to patients home  °

## 2021-06-16 NOTE — Telephone Encounter (Signed)
Transition Care Management Follow-up Telephone Call Date of discharge and from where: [facility] 06-15-21 Dx: TIA  How have you been since you were released from the hospital? Doing alright  Any questions or concerns? No  Items Reviewed: Did the pt receive and understand the discharge instructions provided? Yes  Medications obtained and verified? Yes  Other? No  Any new allergies since your discharge? No  Dietary orders reviewed? Yes Do you have support at home? Yes   Home Care and Equipment/Supplies: Were home health services ordered? no If so, what is the name of the agency? [facility]  Has the agency set up a time to come to the patient's home? not applicable Were any new equipment or medical supplies ordered?  No What is the name of the medical supply agency? [facility] Were you able to get the supplies/equipment? no Do you have any questions related to the use of the equipment or supplies? No  Functional Questionnaire: (I = Independent and D = Dependent) ADLs: I  Bathing/Dressing- I  Meal Prep- I  Eating- I  Maintaining continence- I  Transferring/Ambulation- I  Managing Meds- I  Follow up appointments reviewed:  PCP Hospital f/u appt confirmed? Yes  Scheduled to see Dr Warrick Parisian on 06-18-21 @ 210pm. [facility] f/u appt confirmed? Yes  Scheduled to see Neuro  on 06-18-21 @ 945am and Cardio 07-17-21 at 340pm. Are transportation arrangements needed? No  If their condition worsens, is the pt aware to call PCP or go to the Emergency Dept.? Yes Was the patient provided with contact information for the PCP's office or ED? Yes Was to pt encouraged to call back with questions or concerns? Yes

## 2021-06-16 NOTE — Progress Notes (Signed)
Cardiology Office Note   Date:  06/16/2021   ID:  Karen, Potter 1941/03/23, MRN [id number]  PCP:  Dettinger, Karen Kaufmann, MD  Cardiologist:   Minus Breeding, MD Referring:  Dettinger, Karen Kaufmann, MD  No chief complaint on file.     History of Present Illness: Karen Potter is a 81 y.o. female who presents for evaluation of shortness of breath.  The patient did have an echo in May 2019 with a small pericardial effusion but normal EF.  This was done to evaluate cardiomegaly and cough.    She was in the hospital in October  2019 with sepsis related to [place].    The patient was discharged from the hospital on February 6 after an overnight stay.  She called EMS with aphasia and confusion.  Head CT was without acute abnormalities.  MRI and MRA of the head and neck demonstrated no acute findings.  She was treated with aspirin and Plavix.  After 21 days it was recommended that she switch to Plavix alone.  She was treated for UTI.  2D echocardiogram is pending at the time of discharge.  However, it does not appear that this was done.    She does not really recall the symptoms herself but her husband said that she was very confused.  Symptoms did resolve after several hours.  She otherwise has not had any recent cardiovascular complaints. he patient denies any new symptoms such as chest discomfort, neck or arm discomfort. There has been no new shortness of breath, PND or orthopnea. There have been no reported palpitations, presyncope or syncope.   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    age 53--  pt states was quarantined   no treatment ;  per pt no symptoms or issues since   History of kidney stones    History of TIA (transient ischemic attack)    03-23-2014  w/ episode temporay amnesia   Hyperlipidemia    Hypertension    Hypothyroidism    Iron deficiency anemia    LAFB (left anterior fascicular block)    OA (osteoarthritis)    OSA on CPAP    severe per study 2003   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   Post-polio limb muscle weakness    left leg  w/ 3 repair surgery's   RBBB (right bundle branch block)    Seasonal allergies    Type 2 diabetes mellitus ([place])    Vitamin D deficiency     Past Surgical History:  Procedure Laterality Date   CARPAL TUNNEL RELEASE Right 05-10-2007   CHOLECYSTECTOMY OPEN  1985   and Appendectomy   CYSTO/  RIGHT URETEROSCOPIC STONE EXTRACTON/  STENT PLACEMENT  06/ 2016   in [place]   CYSTOSCOPY/RETROGRADE/URETEROSCOPY/STONE EXTRACTION WITH BASKET Right 11/17/2015   Procedure: CYSTOSCOPY/RETROGRADE PYELOGRAM RIGHT/DIAGNOSTIC RIGHT URETEROSCOPY/LASER RENAL CALCIFICATION/RIGHT STENT PLACEMENT;  Surgeon: Cleon Gustin, MD;  Location: [facility];  Service:  Urology;  Laterality: Right;   ESOPHAGOGASTRODUODENOSCOPY N/A 08/29/2015   Procedure: ESOPHAGOGASTRODUODENOSCOPY (EGD);  Surgeon: Danie Binder, MD;  [facility];  Service: Endoscopy;  Laterality: N/A;   HOLMIUM LASER APPLICATION Right 2/99/3716   Procedure: HOLMIUM LASER APPLICATION;  Surgeon: Cleon Gustin, MD;  Location: [facility];  Service: Urology;  Laterality: Right;   KNEE ARTHROSCOPY Right [place];  1985;  1995   polio   LUMBAR SPINE SURGERY  09-01-2010   L4 -L5 fusion,  L3 laminectomy,  L5 - S1 foraminotomy   OVARIAN CYST SURGERY Right 1966   PARATHYROIDECTOMY N/A 05/06/2015   Procedure: PARATHYROIDECTOMY;  Surgeon: Armandina Gemma, MD;  Location: [facility];  Service:  General;  Laterality: N/A;   left superior   TONSILLECTOMY  as child   TOTAL ABDOMINAL HYSTERECTOMY W/ BILATERAL SALPINGOOPHORECTOMY  1984   TOTAL KNEE ARTHROPLASTY Right 04-08-2008   TRANSTHORACIC ECHOCARDIOGRAM  04-07-2012   grade 1 diastolic function,  ef 96-78%/  mild AV calcification without stenosis/  trivial MR     Current Outpatient Medications  Medication Sig Dispense Refill   acetaminophen (TYLENOL) 650 MG CR tablet Take 650 mg by mouth in the morning.     albuterol (VENTOLIN HFA) 108 (90 Base) MCG/ACT inhaler Inhale 2 puffs into the lungs every 6 (six) hours as needed for wheezing or shortness of breath. 8 g 0   allopurinol (ZYLOPRIM) 100 MG tablet Take 1 tablet (100 mg total) by mouth daily. 90 tablet 1   aspirin EC 81 MG tablet Take 1 tablet (81 mg total) by mouth daily for 21 days. Swallow whole. 21 tablet 0   Blood Glucose Monitoring Suppl (BLOOD GLUCOSE MONITOR SYSTEM) w/Device KIT Use glucose meter to check blood sugar up to 3 times daily as instructed. One Touch Verio preferred by insurance. Dx: EE11.69 1 each 0   carvedilol (COREG) 6.25 MG tablet Take 1 tablet (6.25 mg total) by mouth 2 (two) times daily with a meal. 180 tablet 1   cefdinir (OMNICEF) 300 MG capsule Take 1 capsule (300 mg total) by mouth daily for 3 days. 3 capsule 0   Cholecalciferol (VITAMIN D3) 2000 UNITS capsule Take 2,000 Units by mouth 2 (two) times daily.      clopidogrel (PLAVIX) 75 MG tablet Take 1 tablet (75 mg total) by mouth every evening. 30 tablet 1   Elderberry 500 MG CAPS Take 500 mg by mouth daily.     furosemide (LASIX) 40 MG tablet Take 1 tablet (40 mg total) by mouth daily. 90 tablet 1   Lancets Misc. KIT Use patient & insurance preferred lancet device to test blood glucose up to 3 times daily for Dx: E11.69 100 each 12   levothyroxine (EUTHYROX) 150 MCG tablet Take 1 tablet (150 mcg total) by mouth daily. 90 tablet 2   losartan (COZAAR) 25 MG tablet Take 1 tablet (25 mg total) by mouth  daily. 90 tablet 1   Omega-3 Fatty Acids (FISH OIL TRIPLE STRENGTH) 1400 MG CAPS Take 1,000 mg by mouth 4 (four) times daily.      ONETOUCH VERIO test strip USE 1 STRIP TO CHECK GLUCOSE UP TO THREE TIMES DAILY AS  INSTRUCTED 100 each 9   pantoprazole (PROTONIX) 40 MG tablet Take 1 tablet (40 mg total) by mouth daily. 30 tablet 1   No current facility-administered medications for this visit.    Allergies:   Codeine, Dilaudid [hydromorphone], Other, Hydrocodone-acetaminophen, and  Doxycycline    ROS:  Please see the history of present illness.   Otherwise, review of systems are positive for none.   All other systems are reviewed and negative.    PHYSICAL EXAM: VS:  BP (!) 156/88    Pulse 80    Ht 5' 4.5" (1.638 m)    Wt 242 lb 12.8 oz (110.1 kg)    SpO2 99%    BMI 41.03 kg/m  , BMI Body mass index is 41.03 kg/m. GENERAL:  Well appearing NECK:  No jugular venous distention, waveform within normal limits, carotid upstroke brisk and symmetric, no bruits, no thyromegaly LUNGS:  Clear to auscultation bilaterally CHEST:  Unremarkable HEART:  PMI not displaced or sustained,S1 and S2 within normal limits, no S3, no S4, no clicks, no rubs, no murmurs ABD:  Flat, positive bowel sounds normal in frequency in pitch, no bruits, no rebound, no guarding, no midline pulsatile mass, no hepatomegaly, no splenomegaly EXT:  2 plus pulses throughout, no edema, no cyanosis no clubbing   EKG:  EKG is not ordered today. The ekg ordered to 523 demonstrates sinus rhythm with short PR interval, rate 81, right bundle branch block, left anterior fascicular block.  Premature Contractions   Recent Labs: 09/03/2020: TSH 0.904 06/14/2021: ALT 29; BUN 12; Creatinine, Ser 0.90; Hemoglobin 13.8; Platelets 273; Potassium 3.3; Sodium 138    Lipid Panel    Component Value Date/Time   CHOL 132 06/15/2021 0523   CHOL 139 02/17/2021 0906   CHOL 129 10/03/2012 1310   TRIG 240 (H) 06/15/2021 0523   TRIG 181 (H) 04/10/2013  1206   TRIG 264 (H) 10/03/2012 1310   HDL 28 (L) 06/15/2021 0523   HDL 30 (L) 02/17/2021 0906   HDL 39 (L) 04/10/2013 1206   HDL 29 (L) 10/03/2012 1310   CHOLHDL 4.7 06/15/2021 0523   VLDL 48 (H) 06/15/2021 0523   LDLCALC 56 06/15/2021 0523   LDLCALC 72 02/17/2021 0906   LDLCALC 37 04/10/2013 1206   LDLCALC 47 10/03/2012 1310      Wt Readings from Last 3 Encounters:  06/16/21 242 lb 12.8 oz (110.1 kg)  06/14/21 240 lb (108.9 kg)  06/11/21 243 lb (110.2 kg)      Other studies Reviewed: Additional studies/ records that were reviewed today include:  Hospital records, labs Review of the above records demonstrates:      ASSESSMENT AND PLAN:  TIA: The patient's will get an echocardiogram.  Otherwise the plan is per neurology with dual antiplatelet therapy for 21 days and then Plavix alone.  ABNORMAL EKG:   I do not think her events were likely related to bradycardia arrhythmias but given these and the abnormal EKG I will check a 2-week monitor.  SLEEP APNEA:   She does use CPAP.   Current medicines are reviewed at length with the patient today.  The patient does not have concerns regarding medicines.  The following changes have been made:  None  Labs/ tests ordered today include:    Orders Placed This Encounter  Procedures   LONG TERM MONITOR (3-14 DAYS)   ECHOCARDIOGRAM COMPLETE     Disposition:   FU with me in 12 months.     Signed, Minus Breeding, MD  06/16/2021 5:34 PM    [facility]

## 2021-06-17 DIAGNOSIS — E039 Hypothyroidism, unspecified: Secondary | ICD-10-CM | POA: Diagnosis not present

## 2021-06-17 DIAGNOSIS — G63 Polyneuropathy in diseases classified elsewhere: Secondary | ICD-10-CM | POA: Diagnosis not present

## 2021-06-17 DIAGNOSIS — E038 Other specified hypothyroidism: Secondary | ICD-10-CM | POA: Diagnosis not present

## 2021-06-17 DIAGNOSIS — I110 Hypertensive heart disease with heart failure: Secondary | ICD-10-CM | POA: Diagnosis not present

## 2021-06-17 DIAGNOSIS — E1169 Type 2 diabetes mellitus with other specified complication: Secondary | ICD-10-CM | POA: Diagnosis not present

## 2021-06-17 DIAGNOSIS — I4891 Unspecified atrial fibrillation: Secondary | ICD-10-CM | POA: Diagnosis not present

## 2021-06-17 DIAGNOSIS — E785 Hyperlipidemia, unspecified: Secondary | ICD-10-CM | POA: Diagnosis not present

## 2021-06-17 DIAGNOSIS — J449 Chronic obstructive pulmonary disease, unspecified: Secondary | ICD-10-CM | POA: Diagnosis not present

## 2021-06-17 DIAGNOSIS — D6869 Other thrombophilia: Secondary | ICD-10-CM | POA: Diagnosis not present

## 2021-06-17 DIAGNOSIS — E261 Secondary hyperaldosteronism: Secondary | ICD-10-CM | POA: Diagnosis not present

## 2021-06-17 DIAGNOSIS — I509 Heart failure, unspecified: Secondary | ICD-10-CM | POA: Diagnosis not present

## 2021-06-17 LAB — URINE CULTURE: Culture: >=100000 — AB

## 2021-06-18 ENCOUNTER — Ambulatory Visit: Payer: PPO | Admitting: Neurology

## 2021-06-18 ENCOUNTER — Encounter: Payer: Self-pay | Admitting: Family Medicine

## 2021-06-18 ENCOUNTER — Ambulatory Visit (INDEPENDENT_AMBULATORY_CARE_PROVIDER_SITE_OTHER): Payer: PPO | Admitting: Family Medicine

## 2021-06-18 ENCOUNTER — Encounter: Payer: Self-pay | Admitting: Neurology

## 2021-06-18 VITALS — BP 122/80 | HR 6 | Temp 96.9°F | Resp 20 | Ht 63.0 in | Wt 240.0 lb

## 2021-06-18 VITALS — BP 122/80 | HR 82 | Ht 62.0 in | Wt 243.0 lb

## 2021-06-18 DIAGNOSIS — Z9989 Dependence on other enabling machines and devices: Secondary | ICD-10-CM | POA: Diagnosis not present

## 2021-06-18 DIAGNOSIS — G4733 Obstructive sleep apnea (adult) (pediatric): Secondary | ICD-10-CM | POA: Diagnosis not present

## 2021-06-18 DIAGNOSIS — N3000 Acute cystitis without hematuria: Secondary | ICD-10-CM

## 2021-06-18 DIAGNOSIS — M109 Gout, unspecified: Secondary | ICD-10-CM | POA: Diagnosis not present

## 2021-06-18 DIAGNOSIS — Z8673 Personal history of transient ischemic attack (TIA), and cerebral infarction without residual deficits: Secondary | ICD-10-CM

## 2021-06-18 DIAGNOSIS — R4701 Aphasia: Secondary | ICD-10-CM | POA: Diagnosis not present

## 2021-06-18 MED ORDER — METHYLPREDNISOLONE ACETATE 40 MG/ML IJ SUSP
80.0000 mg | Freq: Once | INTRAMUSCULAR | Status: AC
  Administered 2021-06-18: 80 mg via INTRAMUSCULAR

## 2021-06-18 MED ORDER — CLOPIDOGREL BISULFATE 75 MG PO TABS: 75.0000 mg | ORAL_TABLET | Freq: Every evening | ORAL | 1 refills | Status: DC

## 2021-06-18 NOTE — Patient Instructions (Signed)
It was nice to see you again today.  Thankfully, your neurological symptoms have resolved and you feel at baseline, I am sorry to hear that you are struggling with a gout flare.  Please talk to your primary care physician about management of gout acutely at this point and continue with your current medication regimen.  I would like for you to continue with your Plavix long-term, as instructed, you will take a baby aspirin with the Plavix for a total of 3 weeks and then maintain on Plavix only, you can ask Dr. Warrick Parisian for refills on the Plavix in the future. Please also talk to your primary care about your paleness in your fingers and purplish discoloration of your nails intermittently.  He may want you to see a specialist like a vascular specialist, sometimes there could be an underlying rheumatological condition.    Please continue to work on weight loss, please talk to your primary care about additional help with weight loss.    We do not really need to do additional testing from our end of things.  Please follow through with the echocardiogram that is the heart ultrasound as well as the heart monitor as planned with your cardiologist.  Follow-up with cardiology as scheduled.  Please also eliminate your soda intake.  I would like for you to increase your water intake to 3-4 bottles per day, 16.9 ounce each.  Please continue using your CPAP regularly. While your insurance requires that you use CPAP at least 4 hours each night on 70% of the nights, I recommend, that you not skip any nights and use it throughout the night if you can. Getting used to CPAP and staying with the treatment long term does take time and patience and discipline. Untreated obstructive sleep apnea when it is moderate to severe can have an adverse impact on cardiovascular health and raise her risk for heart disease, arrhythmias, hypertension, congestive heart failure, stroke and diabetes. Untreated obstructive sleep apnea causes sleep  disruption, nonrestorative sleep, and sleep deprivation. This can have an impact on your day to day functioning and cause daytime sleepiness and impairment of cognitive function, memory loss, mood disturbance, and problems focussing. Using CPAP regularly can improve these symptoms.  Please follow-up with [facility] as scheduled.

## 2021-06-18 NOTE — Progress Notes (Signed)
BP 122/80    Pulse (!) 6    Temp (!) 96.9 F (36.1 C) (Oral)    Resp 20    Ht '5\' 3"'  (1.6 m)    Wt 240 lb (108.9 kg)    SpO2 95%    BMI 42.51 kg/m    Subjective:   Patient ID: Karen Potter, female    DOB: 11/23/1940, 81 y.o.   MRN: [id number]  HPI: Karen Potter is a 81 y.o. female presenting on 06/18/2021 for Transitions Of Care   HPI Transition Care Management Follow-up Telephone Call Date of discharge and from where: [facility] 06-15-21 Dx: TIA  How have you been since you were released from the hospital? Doing alright  Any questions or concerns? No Patient contacted on 06/16/2021 by Juanda Crumble, LPN  Transitional care office visit Patient was admitted on 06/14/2021 to the hospital and discharged on 06/15/2021.  She reported aphasia and confusion upon presentation to the ED which happened suddenly and her Sunday school she takes an aspirin daily.  The recommendation upon discharge with aspirin 81 mg with Plavix for 21 days and then Plavix alone.  She was also treated for a urinary tract infection.  Since leaving the hospital they have not had any further issues with aphasia or confusion like she was having prior to go to the hospital.  They did start her on Plavix and aspirin for 21 days and then we will keep her on Plavix.  She does have follow-up with neurology as well.  Her biggest issue since getting out because she missed her allopurinol for a few days she started having some right great toe swelling and redness and pain.  She is concerned for gout because she had to hold her allopurinol dose in the hospital.  She denies any further issues with any strokelike symptoms.  She denies any numbness or weakness.  Relevant past medical, surgical, family and social history reviewed and updated as indicated. Interim medical history since our last visit reviewed. Allergies and medications reviewed and updated.  Review of Systems  Constitutional:  Negative for chills and fever.  Eyes:   Negative for visual disturbance.  Respiratory:  Negative for chest tightness and shortness of breath.   Cardiovascular:  Negative for chest pain and leg swelling.  Musculoskeletal:  Positive for arthralgias and joint swelling. Negative for back pain and gait problem.  Skin:  Positive for color change. Negative for rash and wound.  Neurological:  Negative for dizziness, speech difficulty, weakness, light-headedness, numbness and headaches.  Psychiatric/Behavioral:  Negative for agitation and behavioral problems.   All other systems reviewed and are negative.  Per HPI unless specifically indicated above   Allergies as of 06/18/2021       Reactions   Codeine Itching   Dilaudid [hydromorphone] Other (See Comments)   Mouth blisters   Other Itching   Most pain meds cause itching.  When has to take pain medication, she has been instructed to take Benadryl   Hydrocodone-acetaminophen Other (See Comments)   Doxycycline Rash, Other (See Comments)        Medication List        Accurate as of June 18, 2021  2:53 PM. If you have any questions, ask your nurse or doctor.          STOP taking these medications    cefdinir 300 MG capsule Commonly known as: OMNICEF Stopped by: Worthy Rancher, MD       TAKE  these medications    acetaminophen 650 MG CR tablet Commonly known as: TYLENOL Take 650 mg by mouth in the morning.   albuterol 108 (90 Base) MCG/ACT inhaler Commonly known as: VENTOLIN HFA Inhale 2 puffs into the lungs every 6 (six) hours as needed for wheezing or shortness of breath.   allopurinol 100 MG tablet Commonly known as: ZYLOPRIM Take 1 tablet (100 mg total) by mouth daily.   aspirin EC 81 MG tablet Take 1 tablet (81 mg total) by mouth daily for 21 days. Swallow whole.   Blood Glucose Monitor System w/Device Kit Use glucose meter to check blood sugar up to 3 times daily as instructed. One Touch Verio preferred by insurance. Dx: EE11.69   carvedilol  6.25 MG tablet Commonly known as: COREG Take 1 tablet (6.25 mg total) by mouth 2 (two) times daily with a meal.   clopidogrel 75 MG tablet Commonly known as: PLAVIX Take 1 tablet (75 mg total) by mouth every evening.   Elderberry 500 MG Caps Take 500 mg by mouth daily.   Fish Oil Triple Strength 1400 MG Caps Take 1,000 mg by mouth 4 (four) times daily.   furosemide 40 MG tablet Commonly known as: LASIX Take 1 tablet (40 mg total) by mouth daily.   Lancets Misc. Kit Use patient & insurance preferred lancet device to test blood glucose up to 3 times daily for Dx: E11.69   levothyroxine 150 MCG tablet Commonly known as: Euthyrox Take 1 tablet (150 mcg total) by mouth daily.   losartan 25 MG tablet Commonly known as: COZAAR Take 1 tablet (25 mg total) by mouth daily.   OneTouch Verio test strip Generic drug: glucose blood USE 1 STRIP TO CHECK GLUCOSE UP TO THREE TIMES DAILY AS  INSTRUCTED   pantoprazole 40 MG tablet Commonly known as: Protonix Take 1 tablet (40 mg total) by mouth daily.   Vitamin D3 50 MCG (2000 UT) capsule Take 2,000 Units by mouth 2 (two) times daily.         Objective:   BP 122/80    Pulse (!) 6    Temp (!) 96.9 F (36.1 C) (Oral)    Resp 20    Ht '5\' 3"'  (1.6 m)    Wt 240 lb (108.9 kg)    SpO2 95%    BMI 42.51 kg/m   Wt Readings from Last 3 Encounters:  06/18/21 240 lb (108.9 kg)  06/18/21 243 lb (110.2 kg)  06/16/21 242 lb 12.8 oz (110.1 kg)    Physical Exam Vitals and nursing note reviewed.  Constitutional:      General: She is not in acute distress.    Appearance: She is well-developed. She is not diaphoretic.  Eyes:     Extraocular Movements: Extraocular movements intact.     Conjunctiva/sclera: Conjunctivae normal.     Pupils: Pupils are equal, round, and reactive to light.  Cardiovascular:     Rate and Rhythm: Normal rate and regular rhythm.     Heart sounds: Normal heart sounds. No murmur heard. Pulmonary:     Effort: Pulmonary  effort is normal. No respiratory distress.     Breath sounds: Normal breath sounds. No wheezing.  Musculoskeletal:        General: No tenderness. Normal range of motion.  Skin:    General: Skin is warm and dry.     Findings: Erythema (Patient has redness and swelling around her right great toe on the MTP joint) present. No rash.  Neurological:  Mental Status: She is alert and oriented to person, place, and time.     Cranial Nerves: No cranial nerve deficit.     Sensory: No sensory deficit.     Motor: No weakness.     Coordination: Coordination normal.     Gait: Gait normal.  Psychiatric:        Behavior: Behavior normal.      Assessment & Plan:   Problem List Items Addressed This Visit       Other   Aphasia   Relevant Orders   CBC with Differential/Platelet   CMP14+EGFR   Other Visit Diagnoses     History of TIA (transient ischemic attack)    -  Primary   Relevant Medications   clopidogrel (PLAVIX) 75 MG tablet   Other Relevant Orders   CBC with Differential/Platelet   CMP14+EGFR   Acute cystitis without hematuria       Acute gout involving toe of right foot, unspecified cause       Relevant Medications   methylPREDNISolone acetate (DEPO-MEDROL) injection 80 mg (Start on 06/18/2021  3:00 PM)       We will treat to like gout, continue the Plavix and continue to follow with neurology. Follow up plan: Return in about 2 months (around 08/16/2021), or if symptoms worsen or fail to improve, for Diabetes check.  Counseling provided for all of the vaccine components Orders Placed This Encounter  Procedures   CBC with Differential/Platelet   [place] Petula Rotolo, MD [facility] 06/18/2021, 2:53 PM

## 2021-06-18 NOTE — Progress Notes (Signed)
Subjective:    Patient ID: Karen Potter is a 81 y.o. female.  HPI    Interim history:   Karen Potter is a 81 year old right-handed woman with an underlying complex medical history of type 2 diabetes, right bundle branch block, history of polio, osteoarthritis, hypothyroidism, hypertension, hyperlipidemia, history of hepatitis B, history of TIA, reflux disease, seasonal allergies, peripheral edema, type 2 diabetes, OSA on CPAP, and morbid obesity with BMI of over 40, who presents for FU consultation of a suspected recent TIA after hospitalization.  The patient is accompanied by her husband today.   I evaluated her on 10/09/2019 for a suspected TIA in April 2020.  We talked about risk factor modification and weight loss at the time.  We proceeded with a brain MRI.  She had a brain MRI without contrast on 10/19/2018 and I reviewed the results: IMPRESSION: Unremarkable MRI brain. No acute or focal intracranial abnormality. Similar appearance to priors. She was advised of the test results via phone call.  She had admission to the hospital on 06/14/2021 with a transient episode of aphasia and confusion.  Symptoms had resolved by the time she was evaluated in the emergency room.  She was on aspirin 325 mg daily and was placed on dual antiplatelet therapy with the plan to continue with Plavix after 3 weeks.  I reviewed the emergency room records as well as hospital records.  She had a head CT without contrast on 06/14/2021 and I reviewed the results: Impression: No acute intracranial abnormalities.  She had a brain MRI with and without contrast as well as MR angiogram of the head and MR angiogram of the neck on 06/14/2021 and I reviewed the results: IMPRESSION: No acute infarction, hemorrhage, or mass.   No large vessel occlusion, hemodynamically significant stenosis, or evidence of dissection.  Lipid panel from 06/15/2021 showed total cholesterol of 132, triglycerides elevated at 240, HDL 28, LDL 56.  She  has been followed in this clinic for sleep apnea on a regular basis.  She saw Ward Givens, NP on 06/11/2021, at which time she was compliant with her CPAP of 12 cm with good apnea control.  Average usage of over 7 hours.  She saw Ward Givens, NP on 06/05/2020, at which time she was compliant with her CPAP with good apnea control and average usage of nearly 8 hours.   She saw Ward Givens, NP on 06/05/2019, at which time she was fully compliant with her CPAP machine at 12 cm with an average usage of over 7 hours and very good apnea control.   Today, 06/18/2021 (all dictated new, as well as above notes, some dictation done in note pad or Word, outside of chart, may appear as copied):  She reports feeling at baseline as far as her neurological symptoms.  2 days ago she woke up with pain in her right big toe, similar to prior gout flareup.  She had a nurse visit recently at home and she is scheduled to see her primary care this afternoon.  She saw cardiology 2 days ago and I reviewed the note from 06/16/2021.  She is scheduled for an echocardiogram later this month and also to await Holter monitor.  As far as vascular risk factors, she does have severe obesity, a prior suspicion for TIAs, type 2 diabetes, hypertension, hyperlipidemia and sleep apnea.  She does not drink much in the way of water, estimates that she drinks about 3 or 4 glasses of water per day, she drinks 2-3  bottles of regular soda.  She does not drink any alcohol, she does not smoke. She has noticed pallor in her fingers and purplish discoloration of her nails at times.  This is not painful, comes and goes.  Does not affect her feet typically.    She has a prior history of suspected TIA, was hospitalized in 2013 with an episode of a aphasia/confusion and had full work-up at the time.  She had seen Dr. Jannifer Franklin in the past.  She was at one point suspected to have transient global amnesia as I understand.   She is compliant with her CPAP.    The patient's allergies, current medications, family history, past medical history, past social history, past surgical history and problem list were reviewed and updated as appropriate.    Previously:  I saw her on 05/31/2018 for sleep apnea follow-up, at which time she was fully compliant with her CPAP machine.  We talked about her sleep study results.  She had a recent interim hospitalization for salmonella enterocolitis and sepsis, complicated by dehydration and acute kidney injury.  I first met her on 01/10/2018 at the request of her primary care physician, at which time she reported a long-standing history of sleep apnea and being on CPAP therapy. She needed reevaluation and new equipment. She was invited for sleep study testing. She had a baseline sleep study, followed by a CPAP titration study. I went over her test results with her in detail today. Baseline sleep study from 01/16/2018 showed a sleep efficiency markedly reduced at 40.6%, sleep latency was markedly delayed at 267 minutes, she had clear difficulty attaining sleep without using CPAP. She had an increased percentage of light stage sleep, REM sleep was absent. Total AHI was 30.3 per hour, average oxygen saturation 93%, nadir was 86% during non-REM sleep. She was invited for a CPAP titration study. She had this on 02/20/2018, sleep efficiency was 53.7%, sleep latency again delayed at 133 minutes. REM latency was 138.5 minutes, REM percentage reduced at 4.5%. She was titrated via full face mask as she is used to using a fullface mask. CPAP was titrated to a pressure of 10 cm. On the final pressure her AHI was 2.6 per hour with supine non-REM sleep achieved an O2 nadir of 87%. Based on her test results I suggested a home treatment pressure of 12 cm.   I reviewed her CPAP compliance data from 04/30/2018 through 05/29/2018, which is a total of 30 days, during which time she used her CPAP every night with percent used days greater than 4 hours  at 100%, indicating superb compliance with an average usage of 7 hours and 10 minutes, residual AHI at goal at 1.1 per hour, leak on the high side with the 95th percentile at 24.2 L/m on a pressure of 12 cm with EPR of 1.    01/10/2018: (She) was previously diagnosed with obstructive sleep apnea and placed on CPAP therapy. She had sleep study testing in June 2003 with severe obstructive sleep apnea diagnosed with a baseline AHI of 92 per hour. She has been on CPAP. A compliance download was not possible today as she has an older machine. I reviewed your office note from 11/07/2017. Her Epworth sleepiness score is 15 out of 24, fatigue score is 60 out of 63. She complains of mouth dryness. 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 They have 4 children, 2 are adopted. She is a nonsmoker  and does not typically utilize alcohol, she drinks caffeine in the form of soda, 2 to 3 bottles per day on average. She has had SOB with minimal exertion since April. She has been on antibiotics and has had a steroid shot. She has had chest x-ray. Her chest x-ray from 11/07/2017 showed mildly enlarged cardiac silhouette with mild bibasilar atelectasis. She reports being fully compliant with CPAP therapy, pressure is at 16, she does not like the ramp feature. She uses humidification and nasal mask which seems to fit well. She would not be opposed to retesting for sleep apnea. She does not feel that her shortness of breath during the day is related to her sleep apnea as she is fully compliant with treatment. Nevertheless, her Epworth sleepiness score is elevated. She also reports not always making enough time for sleep. She goes to bed around 2 and rise time varies. Sometimes she is up by 6 if she needs to and sometimes she may sleep until 10. She has nocturia about once per average night and denies morning headaches. She does not have a close family member with sleep apnea but some distant  cousins. She had exposure to secondhand smoke when she was a child and also before her husband quit smoking. She had a tonsillectomy as a child. Her weight at the time of her sleep study in 2003 was 220, current weight of 258.  Her Past Medical History Is Significant For: 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   age 13--  pt states was quarantined   no treatment ;  per pt no symptoms or issues since   History of kidney stones    History of TIA (transient ischemic attack)    03-23-2014  w/ episode temporay amnesia   Hyperlipidemia    Hypertension    Hypothyroidism    Iron deficiency anemia    LAFB (left anterior fascicular block)    OA (osteoarthritis)    OSA on CPAP    severe per study 2003   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   Post-polio limb muscle weakness    left leg  w/ 3 repair surgery's   RBBB (right bundle branch block)    Seasonal allergies    Type 2 diabetes mellitus ([place])    Vitamin D deficiency     Her Past Surgical History Is Significant For: Past Surgical History:  Procedure Laterality Date   CARPAL TUNNEL RELEASE Right 05-10-2007   CHOLECYSTECTOMY OPEN  1985   and Appendectomy   CYSTO/  RIGHT URETEROSCOPIC STONE EXTRACTON/  STENT PLACEMENT  06/ 2016   in [place]   CYSTOSCOPY/RETROGRADE/URETEROSCOPY/STONE EXTRACTION WITH BASKET Right 11/17/2015   Procedure: CYSTOSCOPY/RETROGRADE PYELOGRAM RIGHT/DIAGNOSTIC RIGHT URETEROSCOPY/LASER RENAL CALCIFICATION/RIGHT STENT PLACEMENT;  Surgeon: Cleon Gustin, MD;  Location: [facility];  Service: Urology;  Laterality: Right;   ESOPHAGOGASTRODUODENOSCOPY N/A 08/29/2015   Procedure: ESOPHAGOGASTRODUODENOSCOPY (EGD);  Surgeon: Danie Binder, MD;  Location: [facility];  Service: Endoscopy;  Laterality: N/A;   HOLMIUM LASER APPLICATION Right 07/31/5571   Procedure: HOLMIUM LASER APPLICATION;  Surgeon: Cleon Gustin, MD;  Location: [facility];  Service: Urology;  Laterality: Right;   KNEE ARTHROSCOPY Right [place];  1985;  1995   polio   LUMBAR SPINE SURGERY  09-01-2010   L4 -L5 fusion,  L3 laminectomy,  L5 - S1 foraminotomy   OVARIAN CYST SURGERY Right 1966   PARATHYROIDECTOMY N/A 05/06/2015   Procedure: PARATHYROIDECTOMY;  Surgeon: Armandina Gemma, MD;  Location: [facility];  Service: General;  Laterality: N/A;   left superior   TONSILLECTOMY  as child   TOTAL ABDOMINAL HYSTERECTOMY W/ BILATERAL SALPINGOOPHORECTOMY  1984   TOTAL KNEE ARTHROPLASTY Right 04-08-2008   TRANSTHORACIC ECHOCARDIOGRAM  04-07-2012   grade 1 diastolic function,  ef 22-02%/  mild AV calcification without stenosis/  trivial MR    Her Family History Is Significant For: Family History  Problem Relation Age of Onset   Heart failure Mother        No details.  No MI   Cancer Father        Lung   Cancer Brother        Lung   Stroke Maternal Grandfather    Cancer Paternal Grandmother        rectal   Cancer Paternal Grandfather        gastric    Her Social History Is Significant For: Social History   Socioeconomic History   Marital status: Married    Spouse name: Mickey   Number of children: 4   Years of education: 13-some college   Highest education level: High school graduate  Occupational History   Occupation: Retired  Tobacco Use   Smoking status: Never   Smokeless tobacco: Never  Scientific laboratory technician Use: Never used  Substance and Sexual Activity   Alcohol use: No   Drug use: No   Sexual activity: Not Currently    Birth  control/protection: Surgical  Other Topics Concern   Not on file  Social History Narrative   Lives with husband.  Four children.     Drinks 1-2 cokes per day at least, but usually drinks water   All children live fairly close. Son is closest, but works 12-13 hours/day.   Disabled daughter in [place] that she helps care for.   Another daughter lives within 15 minutes and takes her shopping and to out of town doctor visits.   Social Determinants of Health   Financial Resource Strain: Low Risk    Difficulty of Paying Living Expenses: Not hard at all  Food Insecurity: No Food Insecurity   Worried About Charity fundraiser in the Last Year: Never true   [place] in the Last Year: Never true  Transportation Needs: No Transportation Needs   Lack of Transportation (Medical): No   Lack of Transportation (Non-Medical): No  Physical Activity: Inactive   Days of Exercise per Week: 0 days   Minutes of Exercise per Session: 0 min  Stress: No Stress Concern Present   Feeling of Stress : Only a little  Social Connections: Engineer, building services of Communication with Friends and Family: More than three times a week   Frequency of Social Gatherings with Friends and Family: More than three times a week   Attends Religious Services: More than 4 times per year   Active Member of [employer]  or Organizations: Yes   Attends Music therapist: More than 4 times per year   Marital Status: Married    Her Allergies Are:  Allergies  Allergen Reactions   Codeine Itching   Dilaudid [Hydromorphone] Other (See Comments)    Mouth blisters   Other Itching    Most pain meds cause itching.  When has to take pain medication, she has been instructed to take Benadryl   Hydrocodone-Acetaminophen Other (See Comments)   Doxycycline Rash and Other (See Comments)  :   Her Current Medications Are:  Outpatient Encounter Medications as of 06/18/2021  Medication Sig   acetaminophen (TYLENOL) 650 MG  CR tablet Take 650 mg by mouth in the morning.   albuterol (VENTOLIN HFA) 108 (90 Base) MCG/ACT inhaler Inhale 2 puffs into the lungs every 6 (six) hours as needed for wheezing or shortness of breath.   allopurinol (ZYLOPRIM) 100 MG tablet Take 1 tablet (100 mg total) by mouth daily.   aspirin EC 81 MG tablet Take 1 tablet (81 mg total) by mouth daily for 21 days. Swallow whole.   Blood Glucose Monitoring Suppl (BLOOD GLUCOSE MONITOR SYSTEM) w/Device KIT Use glucose meter to check blood sugar up to 3 times daily as instructed. One Touch Verio preferred by insurance. Dx: EE11.69   carvedilol (COREG) 6.25 MG tablet Take 1 tablet (6.25 mg total) by mouth 2 (two) times daily with a meal.   cefdinir (OMNICEF) 300 MG capsule Take 1 capsule (300 mg total) by mouth daily for 3 days.   Cholecalciferol (VITAMIN D3) 2000 UNITS capsule Take 2,000 Units by mouth 2 (two) times daily.    clopidogrel (PLAVIX) 75 MG tablet Take 1 tablet (75 mg total) by mouth every evening.   Elderberry 500 MG CAPS Take 500 mg by mouth daily.   furosemide (LASIX) 40 MG tablet Take 1 tablet (40 mg total) by mouth daily.   Lancets Misc. KIT Use patient & insurance preferred lancet device to test blood glucose up to 3 times daily for Dx: E11.69   levothyroxine (EUTHYROX) 150 MCG tablet Take 1 tablet (150 mcg total) by mouth daily.   losartan (COZAAR) 25 MG tablet Take 1 tablet (25 mg total) by mouth daily.   Omega-3 Fatty Acids (FISH OIL TRIPLE STRENGTH) 1400 MG CAPS Take 1,000 mg by mouth 4 (four) times daily.    ONETOUCH VERIO test strip USE 1 STRIP TO CHECK GLUCOSE UP TO THREE TIMES DAILY AS  INSTRUCTED   pantoprazole (PROTONIX) 40 MG tablet Take 1 tablet (40 mg total) by mouth daily.   No facility-administered encounter medications on file as of 06/18/2021.  :  Review of Systems:  Out of a complete 14 point review of systems, all are reviewed and negative with the exception of these symptoms as listed below:   Review of  Systems  Neurological:        Here for f/u from recent hsp visit. Pt reports TIA sx have returned to baseline. Seeing PCP later today for possible gout flare up.   Objective:  Neurological Exam  Physical Exam Physical Examination:   Vitals:   06/18/21 0934  BP: 122/80  Pulse: 82    General Examination: The patient is a very pleasant 81 y.o. female in no acute distress. She appears well-developed and no acute distress and well groomed.   HEENT: Normocephalic, atraumatic, pupils are equal, round and reactive to light and accommodation. Corrective eyeglasses in place. Extraocular tracking is unremarkable. Hearing is grossly intact, right-sided  hearing aid in place. Face is symmetric with normal facial animation, speech is without dysarthria. Normal Facial sensation to light touch.  She has no lip, neck or jaw tremor, airway examination reveals moderate mouth dryness and moderate airway crowding. Tongue protrudes centrally and palate elevates symmetrically. No carotid bruits.   Chest: Clear to auscultation without wheezing, rhonchi or crackles noted.   Heart: S1+S2+0, regular and normal without murmurs, rubs or gallops noted.    Abdomen: Soft, non-tender and non-distended with normal bowel sounds appreciated on auscultation.   Extremities: There is no obvious change in the left distal leg being smaller in caliber (had polio as a child on the left side).  No obvious pitting edema, no obvious discoloration of her hands.   Skin: Warm and dry without trophic changes noted.   Neurologically:  Mental status: The patient is awake, alert and oriented in all 4 spheres. Her immediate and remote memory, attention, language skills and fund of knowledge are appropriate. There is no evidence of aphasia, agnosia, apraxia or anomia. Speech is clear with normal prosody and enunciation. Thought process is linear. Mood is normal and affect is normal.  Cranial nerves II - XII are as described above under  HEENT exam. Motor exam: Normal bulk (except L distal leg), strength and tone is noted. There is no drift, tremor or rebound. Romberg is not tested for safety concerns.  Fine motor skills and coordination: grossly intact.  Cerebellar testing: No dysmetria or intention tremor on finger to nose, Heel-to-shin exam is very restricted secondary to body habitus but she is able to lift up heel to lower shin and has no dysmetria.  Sensory exam: intact to light touch.  Gait, station and balance: She stands with mild difficulty and pushes herself up. She stands naturally slightly wide-based. She walks without a limp but slowly, she has a four-point cane today.   Assessment and Plan:  In summary, Karen Potter is a very pleasant 81 year old right-handed woman with an underlying complex medical history of type 2 diabetes, right bundle branch block, history of polio, osteoarthritis, hypothyroidism, hypertension, hyperlipidemia, history of hepatitis B, history of TIA, reflux disease, seasonal allergies, peripheral edema, type 2 diabetes, OSA on CPAP, and morbid obesity with BMI of over 40, who presents for FU consultation of a suspected recent TIA after recent hospitalization overnight for an episode of aphasia and confusion.  She had work-up in the hospital which I reviewed.  She has been placed on dual antiplatelet treatment and was advised to maintain on Plavix thereafter.  She is encouraged to continue with Plavix, she seems to tolerate it well.  She is advised to ask her primary care for refills on her Plavix in the future.  She is strongly advised to work on weight loss.  She does have vascular risk factors.  She is advised to talk to her primary care about help with weight loss.  She is advised to eliminate her regular soda gradually.  She is encouraged to stay better hydrated with water.  She is encouraged to maintain full compliance with her CPAP, she has been doing really well with it.  She has experienced gout  flares from time to time.  She is currently experiencing a flareup in her gout and has an appointment with her primary care.  She is scheduled for an echocardiogram and a Holter monitor through cardiology.  She is advised to follow through with these tests.  I did not suggest any additional testing from my end  of things.  Exam is nonfocal neurologically.  She is advised regarding her vascular risk factors, which include DM, HTN, HLP, severe obesity, and OSA.  Her latest A1c was above 7 in January 2023.  We talked about the importance of secondary stroke prevention.  I answered all their questions today and the patient and her husband were in agreement, she is indicating that it would be hard for her to quit drinking her regular soda but she is encouraged to try.  She has an appointment already scheduled with Ward Givens, nurse practitioner in this office and is advised to keep it. I spent 45 minutes in total face-to-face time and in reviewing records during pre-charting, more than 50% of which was spent in counseling and coordination of care, reviewing test results, reviewing medications and treatment regimen and/or in discussing or reviewing the diagnosis of TIA, the prognosis and treatment options. Pertinent laboratory and imaging test results that were available during this visit with the patient were reviewed by me and considered in my medical decision making (see chart for details).

## 2021-06-19 LAB — CBC WITH DIFFERENTIAL/PLATELET
Basophils Absolute: 0.1 10*3/uL (ref 0.0–0.2)
Basos: 1 %
EOS (ABSOLUTE): 0.3 10*3/uL (ref 0.0–0.4)
Eos: 3 %
Hematocrit: 39.9 % (ref 34.0–46.6)
Hemoglobin: 13.3 g/dL (ref 11.1–15.9)
Immature Grans (Abs): 0 10*3/uL (ref 0.0–0.1)
Immature Granulocytes: 0 %
Lymphocytes Absolute: 3.5 10*3/uL — ABNORMAL HIGH (ref 0.7–3.1)
Lymphs: 36 %
MCH: 30.4 pg (ref 26.6–33.0)
MCHC: 33.3 g/dL (ref 31.5–35.7)
MCV: 91 fL (ref 79–97)
Monocytes Absolute: 1.2 10*3/uL — ABNORMAL HIGH (ref 0.1–0.9)
Monocytes: 13 %
Neutrophils Absolute: 4.6 10*3/uL (ref 1.4–7.0)
Neutrophils: 47 %
Platelets: 284 10*3/uL (ref 150–450)
RBC: 4.38 x10E6/uL (ref 3.77–5.28)
RDW: 13.4 % (ref 11.7–15.4)
WBC: 9.7 10*3/uL (ref 3.4–10.8)

## 2021-06-19 LAB — CMP14+EGFR
ALT: 21 IU/L (ref 0–32)
AST: 23 IU/L (ref 0–40)
Albumin/Globulin Ratio: 1.2 (ref 1.2–2.2)
Albumin: 3.8 g/dL (ref 3.7–4.7)
Alkaline Phosphatase: 105 IU/L (ref 44–121)
BUN/Creatinine Ratio: 17 (ref 12–28)
BUN: 13 mg/dL (ref 8–27)
Bilirubin Total: 0.3 mg/dL (ref 0.0–1.2)
CO2: 23 mmol/L (ref 20–29)
Calcium: 9 mg/dL (ref 8.7–10.3)
Chloride: 103 mmol/L (ref 96–106)
Creatinine, Ser: 0.76 mg/dL (ref 0.57–1.00)
Globulin, Total: 3.2 g/dL (ref 1.5–4.5)
Glucose: 175 mg/dL — ABNORMAL HIGH (ref 70–99)
Potassium: 4 mmol/L (ref 3.5–5.2)
Sodium: 142 mmol/L (ref 134–144)
Total Protein: 7 g/dL (ref 6.0–8.5)
eGFR: 80 mL/min/{1.73_m2} (ref >59)

## 2021-07-07 ENCOUNTER — Ambulatory Visit (HOSPITAL_COMMUNITY)
Admission: RE | Admit: 2021-07-07 | Discharge: 2021-07-07 | Disposition: A | Discharge status: Home / Self Care | Payer: PPO | Source: Ambulatory Visit | Attending: Cardiology | Admitting: Cardiology

## 2021-07-07 DIAGNOSIS — R001 Bradycardia, unspecified: Secondary | ICD-10-CM | POA: Insufficient documentation

## 2021-07-07 DIAGNOSIS — R0609 Other forms of dyspnea: Secondary | ICD-10-CM | POA: Insufficient documentation

## 2021-07-07 DIAGNOSIS — G459 Transient cerebral ischemic attack, unspecified: Secondary | ICD-10-CM | POA: Diagnosis not present

## 2021-07-07 LAB — ECHOCARDIOGRAM COMPLETE
AR max vel: 2.15 cm2
AV Area VTI: 2.39 cm2
AV Area mean vel: 2.17 cm2
AV Mean grad: 6.1 mmHg
AV Peak grad: 11.3 mmHg
Ao pk vel: 1.68 m/s
Area-P 1/2: 2.2 cm2
S' Lateral: 3.1 cm

## 2021-07-07 NOTE — Progress Notes (Signed)
*  PRELIMINARY RESULTS* Echocardiogram 2D Echocardiogram has been performed.  Samuel Germany 07/07/2021, 10:19 AM

## 2021-07-08 DIAGNOSIS — G459 Transient cerebral ischemic attack, unspecified: Secondary | ICD-10-CM

## 2021-07-08 DIAGNOSIS — R0609 Other forms of dyspnea: Secondary | ICD-10-CM | POA: Diagnosis not present

## 2021-07-08 DIAGNOSIS — R001 Bradycardia, unspecified: Secondary | ICD-10-CM | POA: Diagnosis not present

## 2021-07-17 ENCOUNTER — Ambulatory Visit: Payer: PPO | Admitting: Cardiology

## 2021-07-24 ENCOUNTER — Telehealth: Payer: Self-pay | Admitting: Family Medicine

## 2021-07-24 DIAGNOSIS — K219 Gastro-esophageal reflux disease without esophagitis: Secondary | ICD-10-CM

## 2021-07-24 MED ORDER — PANTOPRAZOLE SODIUM 40 MG PO TBEC: 40.0000 mg | DELAYED_RELEASE_TABLET | Freq: Every day | ORAL | 0 refills | Status: DC

## 2021-07-24 NOTE — Telephone Encounter (Signed)
Sent 90 day supply of protonix ?

## 2021-07-24 NOTE — Telephone Encounter (Signed)
Pt informed

## 2021-07-29 DIAGNOSIS — G459 Transient cerebral ischemic attack, unspecified: Secondary | ICD-10-CM | POA: Diagnosis not present

## 2021-07-29 DIAGNOSIS — R0609 Other forms of dyspnea: Secondary | ICD-10-CM | POA: Diagnosis not present

## 2021-07-29 DIAGNOSIS — R001 Bradycardia, unspecified: Secondary | ICD-10-CM | POA: Diagnosis not present

## 2021-07-30 DIAGNOSIS — H524 Presbyopia: Secondary | ICD-10-CM | POA: Diagnosis not present

## 2021-07-30 DIAGNOSIS — H5202 Hypermetropia, left eye: Secondary | ICD-10-CM | POA: Diagnosis not present

## 2021-07-30 DIAGNOSIS — E119 Type 2 diabetes mellitus without complications: Secondary | ICD-10-CM | POA: Diagnosis not present

## 2021-07-30 DIAGNOSIS — I10 Essential (primary) hypertension: Secondary | ICD-10-CM | POA: Diagnosis not present

## 2021-07-30 DIAGNOSIS — H52223 Regular astigmatism, bilateral: Secondary | ICD-10-CM | POA: Diagnosis not present

## 2021-07-30 DIAGNOSIS — H35032 Hypertensive retinopathy, left eye: Secondary | ICD-10-CM | POA: Diagnosis not present

## 2021-08-02 ENCOUNTER — Encounter: Payer: Self-pay | Admitting: Cardiology

## 2021-09-07 ENCOUNTER — Encounter: Payer: Self-pay | Admitting: Family Medicine

## 2021-09-07 ENCOUNTER — Ambulatory Visit (INDEPENDENT_AMBULATORY_CARE_PROVIDER_SITE_OTHER): Payer: PPO | Admitting: Family Medicine

## 2021-09-07 VITALS — BP 142/84 | HR 63 | Ht 63.0 in | Wt 243.0 lb

## 2021-09-07 DIAGNOSIS — E039 Hypothyroidism, unspecified: Secondary | ICD-10-CM | POA: Diagnosis not present

## 2021-09-07 DIAGNOSIS — I152 Hypertension secondary to endocrine disorders: Secondary | ICD-10-CM | POA: Diagnosis not present

## 2021-09-07 DIAGNOSIS — E1159 Type 2 diabetes mellitus with other circulatory complications: Secondary | ICD-10-CM | POA: Diagnosis not present

## 2021-09-07 DIAGNOSIS — K219 Gastro-esophageal reflux disease without esophagitis: Secondary | ICD-10-CM | POA: Diagnosis not present

## 2021-09-07 DIAGNOSIS — J40 Bronchitis, not specified as acute or chronic: Secondary | ICD-10-CM

## 2021-09-07 DIAGNOSIS — E785 Hyperlipidemia, unspecified: Secondary | ICD-10-CM

## 2021-09-07 DIAGNOSIS — E1169 Type 2 diabetes mellitus with other specified complication: Secondary | ICD-10-CM

## 2021-09-07 LAB — BAYER DCA HB A1C WAIVED: HB A1C (BAYER DCA - WAIVED): 6.7 % — ABNORMAL HIGH (ref 4.8–5.6)

## 2021-09-07 MED ORDER — PANTOPRAZOLE SODIUM 40 MG PO TBEC: 40.0000 mg | DELAYED_RELEASE_TABLET | Freq: Every day | ORAL | 3 refills | Status: DC

## 2021-09-07 MED ORDER — ALBUTEROL SULFATE HFA 108 (90 BASE) MCG/ACT IN AERS: 2.0000 | INHALATION_SPRAY | Freq: Four times a day (QID) | RESPIRATORY_TRACT | 5 refills | Status: DC | PRN

## 2021-09-07 NOTE — Addendum Note (Signed)
Addended by: Caryl Pina on: 09/07/2021 11:36 AM ? ? Modules accepted: Orders ? ?

## 2021-09-07 NOTE — Progress Notes (Signed)
? ?BP (!) 142/84   Pulse 63   Ht _0  (1.6 m)   Wt 243 lb (110.2 kg)   SpO2 95%   BMI 43.05 kg/m?   ? ?Subjective:  ? ?Patient ID: Karen Potter, female    DOB: May 25, 1940, 81 y.o.   MRN: [id number] ? ?HPI: ?Karen Potter is a 81 y.o. female presenting on 09/07/2021 for Medical Management of Chronic Issues and Diabetes ? ? ?HPI ?Type 2 diabetes mellitus ?Patient comes in today for recheck of his diabetes. Patient has been currently taking no medicine, diet control. Patient is currently on an ACE inhibitor/ARB. Patient has not seen an ophthalmologist this year. Patient denies any new issues with their feet. The symptom started onset as an adult hypothyroidism hyperlipidemia and hypertension ARE RELATED TO DM  ? ?Hypothyroidism recheck ?Patient is coming in for thyroid recheck today as well. They deny any issues with hair changes or heat or cold problems or diarrhea or constipation. They deny any chest pain or palpitations. They are currently on levothyroxine 150 micrograms  ? ?Hyperlipidemia ?Patient is coming in for recheck of his hyperlipidemia. The patient is currently taking fish oils, intolerant of cholesterol statins. They deny any issues with myalgias or history of liver damage from it. They deny any focal numbness or weakness or chest pain.  ? ?Hypertension ?Patient is currently on furosemide and losartan and carvedilol, and their blood pressure today is 142/84. Patient denies any lightheadedness or dizziness. Patient denies headaches, blurred vision, chest pains, shortness of breath, or weakness. Denies any side effects from medication and is content with current medication.  ? ?GERD ?Patient is currently on pantoprazole.   since we switched her from omeprazole to pantoprazole she has been having more issues with belching and burping and indigestion and some looser stools.  She states that she is not doing as well on it.. She denies any blood in her stool or lightheadedness or dizziness.  ? ?Relevant  past medical, surgical, family and social history reviewed and updated as indicated. Interim medical history since our last visit reviewed. ?Allergies and medications reviewed and updated. ? ?Review of Systems  ?Constitutional:  Negative for chills and fever.  ?Eyes:  Negative for visual disturbance.  ?Respiratory:  Negative for chest tightness and shortness of breath.   ?Cardiovascular:  Negative for chest pain and leg swelling.  ?Gastrointestinal:  Positive for abdominal distention, diarrhea and nausea. Negative for abdominal pain, blood in stool, constipation and vomiting.  ?Genitourinary:  Negative for difficulty urinating and dysuria.  ?Musculoskeletal:  Negative for back pain and gait problem.  ?Skin:  Negative for rash.  ?Neurological:  Negative for light-headedness and headaches.  ?Psychiatric/Behavioral:  Negative for agitation and behavioral problems.   ?All other systems reviewed and are negative. ? ?Per HPI unless specifically indicated above ? ? ?Allergies as of 09/07/2021   ? ?   Reactions  ? Codeine Itching  ? Dilaudid [hydromorphone] Other (See Comments)  ? Mouth blisters  ? Other Itching  ? Most pain meds cause itching.  When has to take pain medication, she has been instructed to take Benadryl  ? Hydrocodone-acetaminophen Other (See Comments)  ? Doxycycline Rash, Other (See Comments)  ? ?  ? ?  ?Medication List  ?  ? ?  ? Accurate as of Sep 07, 2021 11:28 AM. If you have any questions, ask your nurse or doctor.  ?  ?  ? ?  ? ?acetaminophen 650 MG CR tablet ?Commonly known as:  TYLENOL ?Take 650 mg by mouth in the morning. ?  ?albuterol 108 (90 Base) MCG/ACT inhaler ?Commonly known as: VENTOLIN HFA ?Inhale 2 puffs into the lungs every 6 (six) hours as needed for wheezing or shortness of breath. ?  ?allopurinol 100 MG tablet ?Commonly known as: ZYLOPRIM ?Take 1 tablet (100 mg total) by mouth daily. ?  ?Blood Glucose Monitor System w/Device Kit ?Use glucose meter to check blood sugar up to 3 times daily  as instructed. One Touch Verio preferred by insurance. Dx: EE11.69 ?  ?carvedilol 6.25 MG tablet ?Commonly known as: COREG ?Take 1 tablet (6.25 mg total) by mouth 2 (two) times daily with a meal. ?  ?clopidogrel 75 MG tablet ?Commonly known as: PLAVIX ?Take 1 tablet (75 mg total) by mouth every evening. ?  ?Elderberry 500 MG Caps ?Take 500 mg by mouth daily. ?  ?Fish Oil Triple Strength 1400 MG Caps ?Take 1,000 mg by mouth 4 (four) times daily. ?  ?furosemide 40 MG tablet ?Commonly known as: LASIX ?Take 1 tablet (40 mg total) by mouth daily. ?  ?Lancets Misc. Kit ?Use patient & insurance preferred lancet device to test blood glucose up to 3 times daily for Dx: E11.69 ?  ?levothyroxine 150 MCG tablet ?Commonly known as: Euthyrox ?Take 1 tablet (150 mcg total) by mouth daily. ?  ?losartan 25 MG tablet ?Commonly known as: COZAAR ?Take 1 tablet (25 mg total) by mouth daily. ?  ?OneTouch Verio test strip ?Generic drug: glucose blood ?USE 1 STRIP TO CHECK GLUCOSE UP TO THREE TIMES DAILY AS  INSTRUCTED ?  ?pantoprazole 40 MG tablet ?Commonly known as: Protonix ?Take 1 tablet (40 mg total) by mouth daily. ?  ?Vitamin D3 50 MCG (2000 UT) capsule ?Take 2,000 Units by mouth 2 (two) times daily. ?  ? ?  ? ? ? ?Objective:  ? ?BP (!) 142/84   Pulse 63   Ht _0  (1.6 m)   Wt 243 lb (110.2 kg)   SpO2 95%   BMI 43.05 kg/m?   ?Wt Readings from Last 3 Encounters:  ?09/07/21 243 lb (110.2 kg)  ?06/18/21 240 lb (108.9 kg)  ?06/18/21 243 lb (110.2 kg)  ?  ?Physical Exam ?Vitals and nursing note reviewed.  ?Constitutional:   ?   General: She is not in acute distress. ?   Appearance: She is well-developed. She is obese. She is not diaphoretic.  ?Eyes:  ?   Conjunctiva/sclera: Conjunctivae normal.  ?Cardiovascular:  ?   Rate and Rhythm: Normal rate and regular rhythm.  ?   Heart sounds: Normal heart sounds. No murmur heard. ?Pulmonary:  ?   Effort: Pulmonary effort is normal. No respiratory distress.  ?   Breath sounds: Normal breath  sounds. No wheezing.  ?Abdominal:  ?   General: Abdomen is flat. Bowel sounds are normal. There is no distension.  ?   Tenderness: There is no abdominal tenderness. There is no guarding or rebound.  ?Musculoskeletal:     ?   General: No tenderness. Normal range of motion.  ?Skin: ?   General: Skin is warm and dry.  ?   Findings: No rash.  ?Neurological:  ?   Mental Status: She is alert and oriented to person, place, and time.  ?   Coordination: Coordination normal.  ?Psychiatric:     ?   Behavior: Behavior normal.  ? ? ? ? ?Assessment & Plan:  ? ?Problem List Items Addressed This Visit   ? ?  ? Cardiovascular  and Mediastinum  ? Hypertension associated with diabetes ([place])  ?  ? Digestive  ? GERD (gastroesophageal reflux disease) (Chronic)  ? Relevant Medications  ? pantoprazole (PROTONIX) 40 MG tablet  ?  ? Endocrine  ? Hyperlipidemia associated with type 2 diabetes mellitus ([place])  ? Hypothyroid  ? Relevant Orders  ? TSH  ? Type 2 diabetes mellitus with other specified complication ([place]) - Primary  ? ?Other Visit Diagnoses   ? ? Bronchitis      ? Relevant Medications  ? albuterol (VENTOLIN HFA) 108 (90 Base) MCG/ACT inhaler  ? ?  ?  ? recommended adding over-the-counter Pepcid AC for 2 weeks twice a day ? occasionally uses albuterol when she gets congestion. ? ?Monitor blood pressure for now, no change in medicine for now.  Allowing permissive hypertension for age. ? ?Follow up plan: ?Return in about 3 months (around 12/08/2021), or if symptoms worsen or fail to improve, for Diabetes hypertension and cholesterol and thyroid. ? ?Counseling provided for all of the vaccine components ?Orders Placed This Encounter  ?Procedures  ? TSH  ? ? ?Caryl Pina, MD ?[facility] ?09/07/2021, 11:28 AM ? ? ? ? ?

## 2021-09-08 ENCOUNTER — Telehealth: Payer: Self-pay | Admitting: Cardiology

## 2021-09-08 ENCOUNTER — Telehealth: Payer: Self-pay | Admitting: Neurology

## 2021-09-08 LAB — TSH: TSH: 0.391 u[IU]/mL — ABNORMAL LOW (ref 0.450–4.500)

## 2021-09-08 NOTE — Telephone Encounter (Signed)
Tiffany states that they would like to know when pt is able to have cleaning and treatment done. Please advise ?

## 2021-09-08 NOTE — Telephone Encounter (Signed)
Informed Tiffany at [facility] that plavix and aspirin were ordered by patient's neurologist. Jonelle Sidle said she would call patient's neurologist for dental clearance. ?

## 2021-09-08 NOTE — Telephone Encounter (Signed)
Office is closed at this time.  Will try tomorrow.  Need to see what procedure she is having done.  ?

## 2021-09-08 NOTE — Telephone Encounter (Signed)
[facility], need to know when they can treat the patient next due to her stroke.  ?Phone: [phone] ?Fax: [phone] ?

## 2021-09-09 ENCOUNTER — Encounter: Payer: Self-pay | Admitting: *Deleted

## 2021-09-09 NOTE — Telephone Encounter (Signed)
Spoke with [facility]. They state the patient came in yesterday to see them and told them about her stroke. They request documentation from our office that it is ok to treat the patient with cleaning, prophy, filling and crowns.  ?

## 2021-09-09 NOTE — Telephone Encounter (Signed)
Letter signed by Dr Rexene Alberts and faxed to [facility]. Received a receipt of confirmation. ? ?

## 2021-09-09 NOTE — Telephone Encounter (Signed)
Okay to furnish letter stating that patient was suspected to have a TIA in February 2023.  From the neurological standpoint there is no contraindication to pursue routine dental care.  ?

## 2021-09-09 NOTE — Telephone Encounter (Signed)
Letter written and in MD office ready for signature. ?

## 2021-09-15 ENCOUNTER — Ambulatory Visit (INDEPENDENT_AMBULATORY_CARE_PROVIDER_SITE_OTHER): Payer: PPO | Admitting: Family Medicine

## 2021-09-15 ENCOUNTER — Encounter: Payer: Self-pay | Admitting: Family Medicine

## 2021-09-15 VITALS — BP 134/85 | HR 65 | Temp 97.8°F | Ht 63.0 in | Wt 239.8 lb

## 2021-09-15 DIAGNOSIS — R1030 Lower abdominal pain, unspecified: Secondary | ICD-10-CM

## 2021-09-15 DIAGNOSIS — N3000 Acute cystitis without hematuria: Secondary | ICD-10-CM

## 2021-09-15 LAB — URINALYSIS, ROUTINE W REFLEX MICROSCOPIC
Bilirubin, UA: NEGATIVE
Glucose, UA: NEGATIVE
Ketones, UA: NEGATIVE
Nitrite, UA: NEGATIVE
RBC, UA: NEGATIVE
Specific Gravity, UA: 1.02 (ref 1.005–1.030)
Urobilinogen, Ur: 0.2 mg/dL (ref 0.2–1.0)
pH, UA: 6.5 (ref 5.0–7.5)

## 2021-09-15 LAB — MICROSCOPIC EXAMINATION: Renal Epithel, UA: NONE SEEN /hpf

## 2021-09-15 MED ORDER — CEFDINIR 300 MG PO CAPS: 300.0000 mg | ORAL_CAPSULE | Freq: Two times a day (BID) | ORAL | 0 refills | Status: AC

## 2021-09-15 NOTE — Progress Notes (Signed)
? ?Assessment & Plan:  ?1. Acute cystitis without hematuria ?Education provided on UTIs. Encouraged adequate hydration.  ?- Urine Culture ?- cefdinir (OMNICEF) 300 MG capsule; Take 1 capsule (300 mg total) by mouth 2 (two) times daily for 7 days.  Dispense: 14 capsule; Refill: 0 ? ?2. Lower abdominal pain ?- Urinalysis, Routine w reflex microscopic - Urine dipstick shows positive for protein and positive for leukocytes.  Micro exam: 6-10 WBC's per HPF, 0-2 RBC's per HPF, and few bacteria. ? ? ?Follow up plan: Return if symptoms worsen or fail to improve. ? ?Hendricks Limes, MSN, APRN, FNP-C ?[facility] ? ?Subjective:  ? ?Patient ID: Karen Potter, female    DOB: [birth year], 81 y.o.   MRN: [id number] ? ?HPI: ?Karen Potter is a 81 y.o. female presenting on 09/15/2021 for Back Pain (Right lower back ), Abdominal Pain (RLQ), and Diarrhea (X 3 days. Also states she has still been burping from last visit and has gotten any better. ) ? ?Patient is accompanied by her husband, who she is okay with being present. ? ?Patient reports right sided lower back and abdominal pain that started three days ago. Patient reports it is hard to walk or get in and out of the bed. She is taking Tylenol which is not helpful. She has diarrhea chronically but reports it has been worse over the past few days.  ? ? ?ROS: Negative unless specifically indicated above in HPI.  ? ?Relevant past medical history reviewed and updated as indicated.  ? ?Allergies and medications reviewed and updated. ? ? ?Current Outpatient Medications:  ?  acetaminophen (TYLENOL) 650 MG CR tablet, Take 650 mg by mouth in the morning., Disp: , Rfl:  ?  albuterol (VENTOLIN HFA) 108 (90 Base) MCG/ACT inhaler, Inhale 2 puffs into the lungs every 6 (six) hours as needed for wheezing or shortness of breath., Disp: 8 g, Rfl: 5 ?  allopurinol (ZYLOPRIM) 100 MG tablet, Take 1 tablet (100 mg total) by mouth daily., Disp: 90 tablet, Rfl: 1 ?  Blood Glucose  Monitoring Suppl (BLOOD GLUCOSE MONITOR SYSTEM) w/Device KIT, Use glucose meter to check blood sugar up to 3 times daily as instructed. One Touch Verio preferred by insurance. Dx: EE11.69, Disp: 1 each, Rfl: 0 ?  carvedilol (COREG) 6.25 MG tablet, Take 1 tablet (6.25 mg total) by mouth 2 (two) times daily with a meal., Disp: 180 tablet, Rfl: 1 ?  Cholecalciferol (VITAMIN D3) 2000 UNITS capsule, Take 2,000 Units by mouth 2 (two) times daily. , Disp: , Rfl:  ?  clopidogrel (PLAVIX) 75 MG tablet, Take 1 tablet (75 mg total) by mouth every evening., Disp: 90 tablet, Rfl: 1 ?  Elderberry 500 MG CAPS, Take 500 mg by mouth daily., Disp: , Rfl:  ?  famotidine (PEPCID) 10 MG tablet, Take 10 mg by mouth 2 (two) times daily., Disp: , Rfl:  ?  furosemide (LASIX) 40 MG tablet, Take 1 tablet (40 mg total) by mouth daily., Disp: 90 tablet, Rfl: 1 ?  Lancets Misc. KIT, Use patient & insurance preferred lancet device to test blood glucose up to 3 times daily for Dx: E11.69, Disp: 100 each, Rfl: 12 ?  levothyroxine (EUTHYROX) 150 MCG tablet, Take 1 tablet (150 mcg total) by mouth daily., Disp: 90 tablet, Rfl: 2 ?  losartan (COZAAR) 25 MG tablet, Take 1 tablet (25 mg total) by mouth daily., Disp: 90 tablet, Rfl: 1 ?  Omega-3 Fatty Acids (FISH OIL TRIPLE STRENGTH) 1400 MG CAPS,  Take 1,000 mg by mouth 4 (four) times daily. , Disp: , Rfl:  ?  ONETOUCH VERIO test strip, USE 1 STRIP TO CHECK GLUCOSE UP TO THREE TIMES DAILY AS  INSTRUCTED, Disp: 100 each, Rfl: 9 ?  pantoprazole (PROTONIX) 40 MG tablet, Take 1 tablet (40 mg total) by mouth daily., Disp: 90 tablet, Rfl: 3 ? ?Allergies  ?Allergen Reactions  ? Codeine Itching  ? Dilaudid [Hydromorphone] Other (See Comments)  ?  Mouth blisters  ? Other Itching  ?  Most pain meds cause itching.  When has to take pain medication, she has been instructed to take Benadryl  ? Hydrocodone-Acetaminophen Other (See Comments)  ? Doxycycline Rash and Other (See Comments)  ? ? ?Objective:  ? ?BP 134/85    Pulse 65   Temp 97.8 ?F (36.6 ?C) (Temporal)   Ht '5\' 3"'  (1.6 m)   Wt 239 lb 12.8 oz (108.8 kg)   SpO2 95%   BMI 42.48 kg/m?   ? ?Physical Exam ?Vitals reviewed.  ?Constitutional:   ?   General: She is not in acute distress. ?   Appearance: Normal appearance. She is not ill-appearing, toxic-appearing or diaphoretic.  ?HENT:  ?   Head: Normocephalic and atraumatic.  ?Eyes:  ?   General: No scleral icterus.    ?   Right eye: No discharge.     ?   Left eye: No discharge.  ?   Conjunctiva/sclera: Conjunctivae normal.  ?Cardiovascular:  ?   Rate and Rhythm: Normal rate.  ?Pulmonary:  ?   Effort: Pulmonary effort is normal. No respiratory distress.  ?Abdominal:  ?   Tenderness: There is abdominal tenderness in the right lower quadrant.  ?Musculoskeletal:     ?   General: Normal range of motion.  ?   Cervical back: Normal range of motion.  ?   Lumbar back: Tenderness (right side) present. No bony tenderness.  ?Skin: ?   General: Skin is warm and dry.  ?   Capillary Refill: Capillary refill takes less than 2 seconds.  ?Neurological:  ?   General: No focal deficit present.  ?   Mental Status: She is alert and oriented to person, place, and time. Mental status is at baseline.  ?Psychiatric:     ?   Mood and Affect: Mood normal.     ?   Behavior: Behavior normal.     ?   Thought Content: Thought content normal.     ?   Judgment: Judgment normal.  ? ? ? ? ? ? ?

## 2021-09-16 ENCOUNTER — Ambulatory Visit: Payer: PPO

## 2021-09-16 ENCOUNTER — Ambulatory Visit: Payer: PPO | Admitting: Family Medicine

## 2021-09-17 LAB — URINE CULTURE

## 2021-10-19 ENCOUNTER — Encounter: Payer: Self-pay | Admitting: Family Medicine

## 2021-10-19 ENCOUNTER — Ambulatory Visit (INDEPENDENT_AMBULATORY_CARE_PROVIDER_SITE_OTHER): Payer: PPO | Admitting: Family Medicine

## 2021-10-19 VITALS — BP 105/62 | HR 70 | Temp 98.2°F | Ht 63.0 in | Wt 237.5 lb

## 2021-10-19 DIAGNOSIS — J069 Acute upper respiratory infection, unspecified: Secondary | ICD-10-CM

## 2021-10-19 MED ORDER — AMOXICILLIN-POT CLAVULANATE 875-125 MG PO TABS: 1.0000 | ORAL_TABLET | Freq: Two times a day (BID) | ORAL | 0 refills | Status: AC

## 2021-10-19 MED ORDER — PSEUDOEPH-BROMPHEN-DM 30-2-10 MG/5ML PO SYRP: 5.0000 mL | ORAL_SOLUTION | Freq: Four times a day (QID) | ORAL | 0 refills | Status: DC | PRN

## 2021-10-19 NOTE — Progress Notes (Signed)
Acute Office Visit  Subjective:     Patient ID: Karen Potter, female    DOB: July 06, 1940, 81 y.o.   MRN: [id number]  Chief Complaint  Patient presents with   Cough    Cough   Patient is in today for cough and congestion for 6 days. Her cough is now productive. She is also more fatigued than usual. She denies HA, fever, sore throat, wheezing, shortness of breath, or chest pain. She has been taking tessalon pearles, albuterol, and bromfed prn. No hx of COPD, allergies, or asthma. Symptoms have been unchanged.    Review of Systems  Respiratory:  Positive for cough.    As per HPI.      Objective:    BP 105/62   Pulse 70   Temp 98.2 F (36.8 C) (Temporal)   Ht '5\' 3"'$  (1.6 m)   Wt 237 lb 8 oz (107.7 kg)   BMI 42.07 kg/m    Physical Exam Vitals and nursing note reviewed.  Constitutional:      General: She is not in acute distress.    Appearance: She is not ill-appearing, toxic-appearing or diaphoretic.  HENT:     Head: Normocephalic and atraumatic.     Right Ear: Tympanic membrane, ear canal and external ear normal.     Left Ear: Tympanic membrane, ear canal and external ear normal.     Nose: Congestion present.     Right Sinus: No maxillary sinus tenderness or frontal sinus tenderness.     Left Sinus: No maxillary sinus tenderness or frontal sinus tenderness.     Mouth/Throat:     Mouth: Mucous membranes are moist.     Pharynx: Oropharynx is clear.  Eyes:     Conjunctiva/sclera: Conjunctivae normal.     Pupils: Pupils are equal, round, and reactive to light.  Neck:     Vascular: No JVD.  Cardiovascular:     Rate and Rhythm: Normal rate and regular rhythm.     Heart sounds: Normal heart sounds. No murmur heard. Pulmonary:     Effort: Pulmonary effort is normal. No respiratory distress.     Breath sounds: Normal breath sounds. No wheezing, rhonchi or rales.  Chest:     Chest wall: No tenderness.  Musculoskeletal:     Right lower leg: No edema.     Left  lower leg: No edema.  Skin:    General: Skin is warm and dry.  Neurological:     General: No focal deficit present.     Mental Status: She is alert and oriented to person, place, and time.  Psychiatric:        Mood and Affect: Mood normal.        Behavior: Behavior normal.     No results found for any visits on 10/19/21.      Assessment & Plan:   Amarianna was seen today for cough.  Diagnoses and all orders for this visit:  Upper respiratory tract infection, unspecified type Lungs clear on exam today. Augmentin as below. Can continue bromphed, tessalon perles. Stay well hydrated. Return to office for new or worsening symptoms, or if symptoms persist.  -     amoxicillin-clavulanate (AUGMENTIN) 875-125 MG tablet; Take 1 tablet by mouth 2 (two) times daily for 7 days. -     brompheniramine-pseudoephedrine-DM 30-2-10 MG/5ML syrup; Take 5 mLs by mouth 4 (four) times daily as needed.  Return if symptoms worsen or fail to improve.  The patient indicates understanding of these issues  and agrees with the plan.   Gwenlyn Perking, FNP

## 2021-11-11 ENCOUNTER — Encounter: Payer: Self-pay | Admitting: *Deleted

## 2021-11-19 ENCOUNTER — Other Ambulatory Visit: Payer: Self-pay | Admitting: Family Medicine

## 2021-12-04 ENCOUNTER — Ambulatory Visit (INDEPENDENT_AMBULATORY_CARE_PROVIDER_SITE_OTHER): Payer: PPO

## 2021-12-04 VITALS — Wt 237.0 lb

## 2021-12-04 DIAGNOSIS — Z0000 Encounter for general adult medical examination without abnormal findings: Secondary | ICD-10-CM

## 2021-12-04 DIAGNOSIS — Z860100 Personal history of colon polyps, unspecified: Secondary | ICD-10-CM | POA: Insufficient documentation

## 2021-12-04 DIAGNOSIS — N289 Disorder of kidney and ureter, unspecified: Secondary | ICD-10-CM | POA: Insufficient documentation

## 2021-12-04 DIAGNOSIS — Z800 Family history of malignant neoplasm of digestive organs: Secondary | ICD-10-CM | POA: Insufficient documentation

## 2021-12-04 DIAGNOSIS — K909 Intestinal malabsorption, unspecified: Secondary | ICD-10-CM | POA: Insufficient documentation

## 2021-12-04 DIAGNOSIS — K257 Chronic gastric ulcer without hemorrhage or perforation: Secondary | ICD-10-CM | POA: Insufficient documentation

## 2021-12-04 NOTE — Patient Instructions (Signed)
[name] , Thank you for taking time to come for your Medicare Wellness Visit. I appreciate your ongoing commitment to your health goals. Please review the following plan we discussed and let me know if I can assist you in the future.   Screening recommendations/referrals: Colonoscopy: Done 10/14/2017 - Repeat in 5 years  Mammogram: Done 12/02/2020 - Repeat annually *due - order sent Bone Density: Done 08/29/2017 - Repeat every 2 years *due Recommended yearly ophthalmology/optometry visit for glaucoma screening and checkup Recommended yearly dental visit for hygiene and checkup  Vaccinations: Influenza vaccine: Done 02/17/2021 - Repeat annually  Pneumococcal vaccine: Done 02/07/2009 & 08/05/2014 Tdap vaccine: Done 01/07/2011 - Repeat in 10 years *due Shingles vaccine: Zostavax done 2012; Shingrix #1 done 04/10/2019 - Due for Shingrix #2   Covid-19: Done  07/04/2019, 08/01/2019, 03/27/2020, 09/23/2020, & 02/14/2021  Advanced directives: in chart  Conditions/risks identified: Aim for 30 minutes of exercise or brisk walking, 6-8 glasses of water, and 5 servings of fruits and vegetables each day.   Next appointment: Follow up in one year for your annual wellness visit    Preventive Care 65 Years and Older, Female Preventive care refers to lifestyle choices and visits with your health care provider that can promote health and wellness. What does preventive care include? A yearly physical exam. This is also called an annual well check. Dental exams once or twice a year. Routine eye exams. Ask your health care provider how often you should have your eyes checked. Personal lifestyle choices, including: Daily care of your teeth and gums. Regular physical activity. Eating a healthy diet. Avoiding tobacco and drug use. Limiting alcohol use. Practicing safe sex. Taking low-dose aspirin every day. Taking vitamin and mineral supplements as recommended by your health care provider. What happens during  an annual well check? The services and screenings done by your health care provider during your annual well check will depend on your age, overall health, lifestyle risk factors, and family history of disease. Counseling  Your health care provider may ask you questions about your: Alcohol use. Tobacco use. Drug use. Emotional well-being. Home and relationship well-being. Sexual activity. Eating habits. History of falls. Memory and ability to understand (cognition). Work and work Statistician. Reproductive health. Screening  You may have the following tests or measurements: Height, weight, and BMI. Blood pressure. Lipid and cholesterol levels. These may be checked every 5 years, or more frequently if you are over 61 years old. Skin check. Lung cancer screening. You may have this screening every year starting at age 23 if you have a 30-pack-year history of smoking and currently smoke or have quit within the past 15 years. Fecal occult blood test (FOBT) of the stool. You may have this test every year starting at age 90. Flexible sigmoidoscopy or colonoscopy. You may have a sigmoidoscopy every 5 years or a colonoscopy every 10 years starting at age 26. Hepatitis C blood test. Hepatitis B blood test. Sexually transmitted disease (STD) testing. Diabetes screening. This is done by checking your blood sugar (glucose) after you have not eaten for a while (fasting). You may have this done every 1-3 years. Bone density scan. This is done to screen for osteoporosis. You may have this done starting at age 8. Mammogram. This may be done every 1-2 years. Talk to your health care provider about how often you should have regular mammograms. Talk with your health care provider about your test results, treatment options, and if necessary, the need for more tests. Vaccines  Your health care provider may recommend certain vaccines, such as: Influenza vaccine. This is recommended every year. Tetanus,  diphtheria, and acellular pertussis (Tdap, Td) vaccine. You may need a Td booster every 10 years. Zoster vaccine. You may need this after age 79. Pneumococcal 13-valent conjugate (PCV13) vaccine. One dose is recommended after age 12. Pneumococcal polysaccharide (PPSV23) vaccine. One dose is recommended after age 73. Talk to your health care provider about which screenings and vaccines you need and how often you need them. This information is not intended to replace advice given to you by your health care provider. Make sure you discuss any questions you have with your health care provider. Document Released: 05/23/2015 Document Revised: 01/14/2016 Document Reviewed: 02/25/2015 Elsevier Interactive Patient Education  2017 [place] Prevention in the Home Falls can cause injuries. They can happen to people of all ages. There are many things you can do to make your home safe and to help prevent falls. What can I do on the outside of my home? Regularly fix the edges of walkways and driveways and fix any cracks. Remove anything that might make you trip as you walk through a door, such as a raised step or threshold. Trim any bushes or trees on the path to your home. Use bright outdoor lighting. Clear any walking paths of anything that might make someone trip, such as rocks or tools. Regularly check to see if handrails are loose or broken. Make sure that both sides of any steps have handrails. Any raised decks and porches should have guardrails on the edges. Have any leaves, snow, or ice cleared regularly. Use sand or salt on walking paths during winter. Clean up any spills in your garage right away. This includes oil or grease spills. What can I do in the bathroom? Use night lights. Install grab bars by the toilet and in the tub and shower. Do not use towel bars as grab bars. Use non-skid mats or decals in the tub or shower. If you need to sit down in the shower, use a plastic,  non-slip stool. Keep the floor dry. Clean up any water that spills on the floor as soon as it happens. Remove soap buildup in the tub or shower regularly. Attach bath mats securely with double-sided non-slip rug tape. Do not have throw rugs and other things on the floor that can make you trip. What can I do in the bedroom? Use night lights. Make sure that you have a light by your bed that is easy to reach. Do not use any sheets or blankets that are too big for your bed. They should not hang down onto the floor. Have a firm chair that has side arms. You can use this for support while you get dressed. Do not have throw rugs and other things on the floor that can make you trip. What can I do in the kitchen? Clean up any spills right away. Avoid walking on wet floors. Keep items that you use a lot in easy-to-reach places. If you need to reach something above you, use a strong step stool that has a grab bar. Keep electrical cords out of the way. Do not use floor polish or wax that makes floors slippery. If you must use wax, use non-skid floor wax. Do not have throw rugs and other things on the floor that can make you trip. What can I do with my stairs? Do not leave any items on the stairs. Make sure that there are  handrails on both sides of the stairs and use them. Fix handrails that are broken or loose. Make sure that handrails are as long as the stairways. Check any carpeting to make sure that it is firmly attached to the stairs. Fix any carpet that is loose or worn. Avoid having throw rugs at the top or bottom of the stairs. If you do have throw rugs, attach them to the floor with carpet tape. Make sure that you have a light switch at the top of the stairs and the bottom of the stairs. If you do not have them, ask someone to add them for you. What else can I do to help prevent falls? Wear shoes that: Do not have high heels. Have rubber bottoms. Are comfortable and fit you well. Are closed  at the toe. Do not wear sandals. If you use a stepladder: Make sure that it is fully opened. Do not climb a closed stepladder. Make sure that both sides of the stepladder are locked into place. Ask someone to hold it for you, if possible. Clearly mark and make sure that you can see: Any grab bars or handrails. First and last steps. Where the edge of each step is. Use tools that help you move around (mobility aids) if they are needed. These include: Canes. Walkers. Scooters. Crutches. Turn on the lights when you go into a dark area. Replace any light bulbs as soon as they burn out. Set up your furniture so you have a clear path. Avoid moving your furniture around. If any of your floors are uneven, fix them. If there are any pets around you, be aware of where they are. Review your medicines with your doctor. Some medicines can make you feel dizzy. This can increase your chance of falling. Ask your doctor what other things that you can do to help prevent falls. This information is not intended to replace advice given to you by your health care provider. Make sure you discuss any questions you have with your health care provider. Document Released: 02/20/2009 Document Revised: 10/02/2015 Document Reviewed: 05/31/2014 Elsevier Interactive Patient Education  2017 [employer].

## 2021-12-04 NOTE — Progress Notes (Signed)
Subjective:   Karen Potter is a 81 y.o. female who presents for Medicare Annual (Subsequent) preventive examination.  Virtual Visit via Telephone Note  I connected with  Jari Sportsman on 12/04/21 at  1:15 PM EDT by telephone and verified that I am speaking with the correct person using two identifiers.  Location: Patient: Home Provider: [facility] Persons participating in the virtual visit: patient/Nurse Health Advisor   I discussed the limitations, risks, security and privacy concerns of performing an evaluation and management service by telephone and the availability of in person appointments. The patient expressed understanding and agreed to proceed.  Interactive audio and video telecommunications were attempted between this nurse and patient, however failed, due to patient having technical difficulties OR patient did not have access to video capability.  We continued and completed visit with audio only.  Some vital signs may be absent or patient reported.   Amy E Hopkins, LPN   Review of Systems     Cardiac Risk Factors include: advanced age (>69mn, >>67women);diabetes mellitus;dyslipidemia;hypertension;sedentary lifestyle;obesity (BMI >30kg/m2);Other (see comment), Risk factor comments: OSA on CPAP, fatty liver, hx of Polio, hx of TIAs     Objective:    Today's Vitals   12/04/21 1340  Weight: 237 lb (107.5 kg)   Body mass index is 41.98 kg/m.     12/04/2021    1:48 PM 06/14/2021    7:52 PM 06/14/2021    1:44 PM 12/03/2020    1:42 PM 11/23/2020    3:52 PM 12/03/2019    1:39 PM 08/17/2019   12:24 AM  Advanced Directives  Does Patient Have a Medical Advance Directive? Yes Yes No Yes No;Yes Yes No  Type of [place] will  [place] Living will Living will   Does patient want to make changes to medical advance directive?  No - Patient declined    No - Patient declined   Copy of [place] Chart? Yes - validated most recent copy scanned in chart (See row information)   No - copy requested     Would patient like information on creating a medical advance directive?  No - Patient declined No - Patient declined    No - Patient declined    Current Medications (verified) Outpatient Encounter Medications as of 12/04/2021  Medication Sig   acetaminophen (TYLENOL) 650 MG CR tablet Take 650 mg by mouth in the morning.   albuterol (VENTOLIN HFA) 108 (90 Base) MCG/ACT inhaler Inhale 2 puffs into the lungs every 6 (six) hours as needed for wheezing or shortness of breath.   allopurinol (ZYLOPRIM) 100 MG tablet Take 1 tablet by mouth once daily   Blood Glucose Monitoring Suppl (BLOOD GLUCOSE MONITOR SYSTEM) w/Device KIT Use glucose meter to check blood sugar up to 3 times daily as instructed. One Touch Verio preferred by insurance. Dx: EE11.69   carvedilol (COREG) 6.25 MG tablet Take 1 tablet (6.25 mg total) by mouth 2 (two) times daily with a meal.   Cholecalciferol (VITAMIN D3) 2000 UNITS capsule Take 2,000 Units by mouth 2 (two) times daily.    clopidogrel (PLAVIX) 75 MG tablet Take 1 tablet (75 mg total) by mouth every evening.   Elderberry 500 MG CAPS Take 500 mg by mouth daily.   famotidine (PEPCID) 10 MG tablet Take 10 mg by mouth 2 (two) times daily.   furosemide (LASIX) 40 MG tablet Take 1 tablet (40 mg total)  by mouth daily.   Lancets Misc. KIT Use patient & insurance preferred lancet device to test blood glucose up to 3 times daily for Dx: E11.69   levothyroxine (EUTHYROX) 150 MCG tablet Take 1 tablet (150 mcg total) by mouth daily.   losartan (COZAAR) 25 MG tablet Take 1 tablet (25 mg total) by mouth daily.   Omega-3 Fatty Acids (FISH OIL TRIPLE STRENGTH) 1400 MG CAPS Take 1,000 mg by mouth 4 (four) times daily.    ONETOUCH VERIO test strip USE 1 STRIP TO CHECK GLUCOSE UP TO THREE TIMES DAILY AS  INSTRUCTED   pantoprazole (PROTONIX) 40 MG tablet Take 1 tablet  (40 mg total) by mouth daily.   benzonatate (TESSALON) 100 MG capsule Take by mouth 3 (three) times daily as needed for cough. (Patient not taking: Reported on 12/04/2021)   brompheniramine-pseudoephedrine-DM 30-2-10 MG/5ML syrup Take 5 mLs by mouth 4 (four) times daily as needed. (Patient not taking: Reported on 12/04/2021)   No facility-administered encounter medications on file as of 12/04/2021.    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   age 89--  pt states was quarantined   no treatment ;  per pt no symptoms or issues since   History of kidney stones    History of TIA (transient ischemic attack)    03-23-2014  w/ episode temporay amnesia   Hyperlipidemia    Hypertension    Hypothyroidism    Iron deficiency anemia    LAFB (left anterior fascicular block)    OA (osteoarthritis)    OSA on CPAP    severe per study 2003   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   Post-polio limb muscle weakness    left leg  w/ 3 repair surgery's   RBBB (right bundle branch block)    Seasonal allergies    Type 2 diabetes mellitus ([place])    Vitamin D deficiency    Past Surgical History:  Procedure Laterality Date   CARPAL TUNNEL RELEASE Right 05-10-2007   CHOLECYSTECTOMY OPEN  1985   and Appendectomy   CYSTO/  RIGHT URETEROSCOPIC STONE EXTRACTON/  STENT PLACEMENT  06/ 2016   in [place]   CYSTOSCOPY/RETROGRADE/URETEROSCOPY/STONE EXTRACTION WITH BASKET Right  11/17/2015   Procedure: CYSTOSCOPY/RETROGRADE PYELOGRAM RIGHT/DIAGNOSTIC RIGHT URETEROSCOPY/LASER RENAL CALCIFICATION/RIGHT STENT PLACEMENT;  Surgeon: Cleon Gustin, MD;  Location: [facility];  Service: Urology;  Laterality: Right;   ESOPHAGOGASTRODUODENOSCOPY N/A 08/29/2015   Procedure: ESOPHAGOGASTRODUODENOSCOPY (EGD);  Surgeon: Danie Binder, MD;  [facility];  Service: Endoscopy;  Laterality: N/A;   HOLMIUM LASER APPLICATION Right 7/51/0258   Procedure: HOLMIUM LASER APPLICATION;  Surgeon: Cleon Gustin, MD;  Location: [facility];  Service: Urology;  Laterality: Right;   KNEE ARTHROSCOPY Right [place];  1985;  [place] SURGERY  09-01-2010  L4 -L5 fusion,  L3 laminectomy,  L5 - S1 foraminotomy   OVARIAN CYST SURGERY Right 1966   PARATHYROIDECTOMY N/A 05/06/2015   Procedure: PARATHYROIDECTOMY;  Surgeon: Armandina Gemma, MD;  Location: [facility];  Service: General;  Laterality: N/A;   left superior   TONSILLECTOMY  as child   TOTAL ABDOMINAL HYSTERECTOMY W/ BILATERAL SALPINGOOPHORECTOMY  1984   TOTAL KNEE ARTHROPLASTY Right 04-08-2008   TRANSTHORACIC ECHOCARDIOGRAM  04-07-2012   grade 1 diastolic function,  ef 29-24%/  mild AV calcification without stenosis/  trivial MR   Family History  Problem Relation Age of Onset   Heart failure Mother        No details.  No MI   Cancer Father        Lung   Cancer Brother        Lung   Stroke Maternal Grandfather    Cancer Paternal Grandmother        rectal   Cancer Paternal Grandfather        gastric   Social History   Socioeconomic History   Marital status: Married    Spouse name: Mickey   Number of children: 4   Years of education: 13-some college   Highest education level: High school graduate  Occupational History   Occupation: Retired  Tobacco Use   Smoking status: Never   Smokeless tobacco: Never  Scientific laboratory technician Use: Never  used  Substance and Sexual Activity   Alcohol use: No   Drug use: No   Sexual activity: Not Currently    Birth control/protection: Surgical  Other Topics Concern   Not on file  Social History Narrative   Lives with husband.  Four children.     Drinks 1-2 cokes per day at least, but usually drinks water   All children live fairly close. Son is closest, but works 12-13 hours/day.   Disabled daughter in [place] that she helps care for.   Another daughter lives within 15 minutes and takes her shopping and to out of town doctor visits.   Social Determinants of Health   Financial Resource Strain: Low Risk  (12/04/2021)   Overall Financial Resource Strain (CARDIA)    Difficulty of Paying Living Expenses: Not hard at all  Food Insecurity: No Food Insecurity (12/04/2021)   Hunger Vital Sign    Worried About Running Out of Food in the Last Year: Never true    Ran Out of Food in the Last Year: Never true  Transportation Needs: No Transportation Needs (12/04/2021)   PRAPARE - Hydrologist (Medical): No    Lack of Transportation (Non-Medical): No  Physical Activity: Insufficiently Active (12/04/2021)   Exercise Vital Sign    Days of Exercise per Week: 7 days    Minutes of Exercise per Session: 10 min  Stress: No Stress Concern Present (12/04/2021)   [place]    Feeling of Stress : Only a little  Social Connections: Socially Integrated (12/04/2021)   Social Connection and Isolation Panel [NHANES]    Frequency of Communication with Friends and Family: More than three times a week    Frequency of Social Gatherings with Friends and Family: More than three times a week    Attends Religious Services: More than 4 times per year    Active Member of [employer] or Organizations: Yes    Attends Archivist Meetings: More than 4 times per year  Marital Status: Married    Tobacco Counseling Counseling  given: Not Answered   Clinical Intake:  Pre-visit preparation completed: Yes  Pain : No/denies pain     BMI - recorded: 41.98 Nutritional Status: BMI > 30  Obese Nutritional Risks: Nausea/ vomitting/ diarrhea (chronic upset stomach) Diabetes: Yes CBG done?: No Did pt. bring in CBG monitor from home?: No  How often do you need to have someone help you when you read instructions, pamphlets, or other written materials from your doctor or pharmacy?: 1 - Never  Diabetic? Nutrition Risk Assessment:  Has the patient had any N/V/D within the last 2 months?  Yes  Does the patient have any non-healing wounds?  No  Has the patient had any unintentional weight loss or weight gain?  No   Diabetes:  Is the patient diabetic?  Yes  If diabetic, was a CBG obtained today?  No  Did the patient bring in their glucometer from home?  No  How often do you monitor your CBG's? daily.   Financial Strains and Diabetes Management:  Are you having any financial strains with the device, your supplies or your medication? No .  Does the patient want to be seen by Chronic Care Management for management of their diabetes?  No  Would the patient like to be referred to a Nutritionist or for Diabetic Management?  No   Diabetic Exams:  Diabetic Eye Exam: Completed 01/28/2021.   Diabetic Foot Exam: Completed 09/07/2021. Pt has been advised about the importance in completing this exam.   Interpreter Needed?: No  Information entered by :: Amy Hopkins, LPN   Activities of Daily Living    12/04/2021    1:44 PM 06/14/2021    7:52 PM  In your present state of health, do you have any difficulty performing the following activities:  Hearing? 1 1  Vision? 0 0  Difficulty concentrating or making decisions? 1 0  Walking or climbing stairs? 1 1  Dressing or bathing? 0 0  Doing errands, shopping? 0 0  Preparing Food and eating ? N   Using the Toilet? N   In the past six months, have you accidently leaked  urine? Y   Do you have problems with loss of bowel control? Y   Managing your Medications? N   Managing your Finances? N   Housekeeping or managing your Housekeeping? N     Patient Care Team: Dettinger, Fransisca Kaufmann, MD as PCP - General (Family Medicine) Minus Breeding, MD as PCP - Cardiology (Cardiology) Clarene Essex, MD as Consulting Physician (Gastroenterology) McKenzie, Candee Furbish, MD as Consulting Physician (Urology) Dawayne Cirri, MD as Referring Physician (Nurse Practitioner) [name], MD as Attending Physician (Neurology)  Indicate any recent Medical Services you may have received from other than [facility] providers in the past year (date may be approximate).     Assessment:   This is a routine wellness examination for Afrah.  Hearing/Vision screen Hearing Screening - Comments:: Wears hearing aids - from [facility] in [place] - Comments:: Wears rx glasses - up to date with routine eye exams with Sabra Heck  Dietary issues and exercise activities discussed: Current Exercise Habits: The patient does not participate in regular exercise at present, Exercise limited by: orthopedic condition(s);neurologic condition(s)   Goals Addressed             This Visit's Progress    Patient Stated       She hopes to get better, get around better, feel  better, no falls, get appetite better       Depression Screen    12/04/2021    1:49 PM 10/19/2021    9:03 AM 09/15/2021   10:49 AM 09/07/2021   10:38 AM 06/18/2021    2:22 PM 05/21/2021    8:08 AM 05/18/2021   12:01 PM  PHQ 2/9 Scores  PHQ - 2 Score 0 0 1 0 0 0 0  PHQ- 9 Score _0 Fall Risk    12/04/2021    1:42 PM 10/19/2021    9:03 AM 09/07/2021   10:37 AM 06/18/2021    2:22 PM 05/21/2021    8:08 AM  Fall Risk   Falls in the past year? 0 0 0 0 0  Number falls in past yr: 0      Injury with Fall? 0      Risk for fall due to : Impaired balance/gait;Orthopedic patient      Follow up Falls prevention  discussed   Falls evaluation completed     FALL RISK PREVENTION PERTAINING TO THE HOME:  Any stairs in or around the home? Yes  If so, are there any without handrails? No  Home free of loose throw rugs in walkways, pet beds, electrical cords, etc? Yes  Adequate lighting in your home to reduce risk of falls? Yes   ASSISTIVE DEVICES UTILIZED TO PREVENT FALLS:  Life alert? No  Use of a cane, walker or w/c?  PRN usually for gout flare ups  Grab bars in the bathroom? Yes  Shower chair or bench in shower? No  Elevated toilet seat or a handicapped toilet? No   TIMED UP AND GO:  Was the test performed? No . Telephonic visit  Cognitive Function:    08/29/2017    2:02 PM  MMSE - Mini Mental State Exam  Orientation to time 5  Orientation to Place 5  Registration 3  Attention/ Calculation 5  Recall 3  Language- name 2 objects 2  Language- repeat 1  Language- follow 3 step command 3  Language- read & follow direction 1  Write a sentence 1  Copy design 1  Total score 30        12/04/2021    1:50 PM 12/03/2019    1:45 PM 10/12/2018    2:41 PM  6CIT Screen  What Year? 0 points 0 points 0 points  What month? 0 points 0 points 0 points  What time? 0 points 0 points 0 points  Count back from 20 0 points 0 points 0 points  Months in reverse 0 points 0 points 0 points  Repeat phrase 2 points 2 points 0 points  Total Score 2 points 2 points 0 points    Immunizations Immunization History  Administered Date(s) Administered   Fluad Quad(high Dose 65+) 01/18/2019, 02/07/2020, 02/17/2021   Influenza, High Dose Seasonal PF 03/20/2013, 02/24/2016, 02/07/2017, 03/10/2018   Influenza,inj,Quad PF,6+ Mos 02/19/2014, 02/06/2015   Moderna Sars-Covid-2 Vaccination 07/04/2019, 08/01/2019, 03/27/2020, 09/23/2020, 02/14/2021   Pneumococcal Conjugate-13 08/05/2014   Pneumococcal Polysaccharide-23 02/07/2009   Tdap 01/07/2011   Zoster Recombinat (Shingrix) 04/10/2019   Zoster, Live 05/10/2010     TDAP status: Due, Education has been provided regarding the importance of this vaccine. Advised may receive this vaccine at local pharmacy or Health Dept. Aware to provide a copy of the vaccination record if obtained from local pharmacy or Health Dept. Verbalized acceptance and understanding.  Flu Vaccine status: Up to  date  Pneumococcal vaccine status: Up to date  Covid-19 vaccine status: Completed vaccines  Qualifies for Shingles Vaccine? Yes   Zostavax completed Yes   Shingrix Completed?: No.    Education has been provided regarding the importance of this vaccine. Patient has been advised to call insurance company to determine out of pocket expense if they have not yet received this vaccine. Advised may also receive vaccine at local pharmacy or Health Dept. Verbalized acceptance and understanding.  Screening Tests Health Maintenance  Topic Date Due   MAMMOGRAM  12/02/2021   DEXA SCAN  09/08/2022 (Originally 08/30/2019)   TETANUS/TDAP  09/08/2022 (Originally 01/06/2021)   Zoster Vaccines- Shingrix (2 of 2) 09/13/2022 (Originally 06/05/2019)   COVID-19 Vaccine (6 - Booster for Moderna series) 09/20/2022 (Originally 04/11/2021)   INFLUENZA VACCINE  12/08/2021   OPHTHALMOLOGY EXAM  01/28/2022   HEMOGLOBIN A1C  03/10/2022   FOOT EXAM  09/08/2022   COLONOSCOPY (Pts 45-25yr Insurance coverage will need to be confirmed)  10/15/2022   Pneumonia Vaccine 80 Years old  Completed   Hepatitis C Screening  Completed   HPV VACCINES  Aged Out    Health Maintenance  Health Maintenance Due  Topic Date Due   MAMMOGRAM  12/02/2021    Colorectal cancer screening: Type of screening: Colonoscopy. Completed 10/14/2017. Repeat every 5 years  Mammogram status: Ordered 12/04/2021. Pt provided with contact info and advised to call to schedule appt.   Bone Density status: Completed 08/29/2017. Results reflect: Bone density results: OSTEOPENIA. Repeat every 2 years.  Lung Cancer Screening: (Low Dose  CT Chest recommended if Age 579-80years, 30 pack-year currently smoking OR have quit w/in 15years.) does not qualify.  Additional Screening:  Hepatitis C Screening: does not qualify; Completed 05/14/2013  Vision Screening: Recommended annual ophthalmology exams for early detection of glaucoma and other disorders of the eye. Is the patient up to date with their annual eye exam?  Yes  Who is the provider or what is the name of the office in which the patient attends annual eye exams? MSabra HeckIf pt is not established with a provider, would they like to be referred to a provider to establish care? No .   Dental Screening: Recommended annual dental exams for proper oral hygiene  Community Resource Referral / Chronic Care Management: CRR required this visit?  No   CCM required this visit?  No      Plan:     I have personally reviewed and noted the following in the patient's chart:   Medical and social history Use of alcohol, tobacco or illicit drugs  Current medications and supplements including opioid prescriptions.  Functional ability and status Nutritional status Physical activity Advanced directives List of other physicians Hospitalizations, surgeries, and ER visits in previous 12 months Vitals Screenings to include cognitive, depression, and falls Referrals and appointments  In addition, I have reviewed and discussed with patient certain preventive protocols, quality metrics, and best practice recommendations. A written personalized care plan for preventive services as well as general preventive health recommendations were provided to patient.     ASandrea Hammond LPN   [id number]  Nurse Notes: Left foot hurts when she steps up, feels like her mobility is getting worse - it is getting very difficult to walk upstairs; also spitting up a lot of white bubbly mucus for the last few months

## 2021-12-09 ENCOUNTER — Encounter: Payer: Self-pay | Admitting: Family Medicine

## 2021-12-09 ENCOUNTER — Ambulatory Visit (INDEPENDENT_AMBULATORY_CARE_PROVIDER_SITE_OTHER): Payer: PPO | Admitting: Family Medicine

## 2021-12-09 VITALS — BP 126/71 | HR 70 | Temp 98.2°F | Ht 63.0 in | Wt 241.0 lb

## 2021-12-09 DIAGNOSIS — Z8673 Personal history of transient ischemic attack (TIA), and cerebral infarction without residual deficits: Secondary | ICD-10-CM

## 2021-12-09 DIAGNOSIS — E039 Hypothyroidism, unspecified: Secondary | ICD-10-CM

## 2021-12-09 DIAGNOSIS — E1159 Type 2 diabetes mellitus with other circulatory complications: Secondary | ICD-10-CM | POA: Diagnosis not present

## 2021-12-09 DIAGNOSIS — E1169 Type 2 diabetes mellitus with other specified complication: Secondary | ICD-10-CM

## 2021-12-09 DIAGNOSIS — M1A0710 Idiopathic chronic gout, right ankle and foot, without tophus (tophi): Secondary | ICD-10-CM | POA: Diagnosis not present

## 2021-12-09 DIAGNOSIS — L723 Sebaceous cyst: Secondary | ICD-10-CM

## 2021-12-09 DIAGNOSIS — E785 Hyperlipidemia, unspecified: Secondary | ICD-10-CM

## 2021-12-09 DIAGNOSIS — I152 Hypertension secondary to endocrine disorders: Secondary | ICD-10-CM | POA: Diagnosis not present

## 2021-12-09 LAB — BAYER DCA HB A1C WAIVED: HB A1C (BAYER DCA - WAIVED): 6.7 % — ABNORMAL HIGH (ref 4.8–5.6)

## 2021-12-09 MED ORDER — ALLOPURINOL 100 MG PO TABS: 100.0000 mg | ORAL_TABLET | Freq: Every day | ORAL | 3 refills | Status: DC

## 2021-12-09 MED ORDER — CARVEDILOL 6.25 MG PO TABS: 6.2500 mg | ORAL_TABLET | Freq: Two times a day (BID) | ORAL | 3 refills | Status: DC

## 2021-12-09 MED ORDER — CLOPIDOGREL BISULFATE 75 MG PO TABS: 75.0000 mg | ORAL_TABLET | Freq: Every evening | ORAL | 3 refills | Status: DC

## 2021-12-09 MED ORDER — LOSARTAN POTASSIUM 25 MG PO TABS: 25.0000 mg | ORAL_TABLET | Freq: Every day | ORAL | 3 refills | Status: DC

## 2021-12-09 NOTE — Progress Notes (Signed)
BP 126/71   Pulse 70   Temp 98.2 F (36.8 C)   Ht _0  (1.6 m)   Wt 241 lb (109.3 kg)   SpO2 96%   BMI 42.69 kg/m    Subjective:   Patient ID: Karen Potter, female    DOB: [birth year], 81 y.o.   MRN: [id number]  HPI: Karen Potter is a 81 y.o. female presenting on 12/09/2021 for Medical Management of Chronic Issues, Diabetes, place on back (Looks like cyst. Site is red), and Fatigue   HPI Type 2 diabetes mellitus Patient comes in today for recheck of his diabetes. Patient has been currently taking no medicine currently, diet controlled, A1c 6.7. Patient is currently on an ACE inhibitor/ARB. Patient has not seen an ophthalmologist this year. Patient denies any issues with their feet. The symptom started onset as an adult hypertension and hyperlipidemia ARE RELATED TO DM   Hypertension Patient is currently on carvedilol and losartan and furosemide, and their blood pressure today is 126/71. Patient denies any lightheadedness or dizziness. Patient denies headaches, blurred vision, chest pains, shortness of breath, or weakness. Denies any side effects from medication and is content with current medication.   Hyperlipidemia Patient is coming in for recheck of his hyperlipidemia. The patient is currently taking fish oils. They deny any issues with myalgias or history of liver damage from it. They deny any focal numbness or weakness or chest pain.   Spot on her back Patient has a lesion on her back that she wants looked at.  She says its been there for more than a year as far as she knows but it just started get a little pink to it.  She denies any swelling or pain or increase in size.  She just wanted to have it checked out to make sure does not look infected.  Relevant past medical, surgical, family and social history reviewed and updated as indicated. Interim medical history since our last visit reviewed. Allergies and medications reviewed and updated.  Review of Systems   Constitutional:  Negative for chills and fever.  Eyes:  Negative for redness and visual disturbance.  Respiratory:  Negative for chest tightness and shortness of breath.   Cardiovascular:  Negative for chest pain and leg swelling.  Genitourinary:  Negative for difficulty urinating and dysuria.  Musculoskeletal:  Negative for back pain and gait problem.  Skin:  Negative for rash.  Neurological:  Negative for dizziness, light-headedness and headaches.  Psychiatric/Behavioral:  Negative for agitation and behavioral problems.   All other systems reviewed and are negative.   Per HPI unless specifically indicated above   Allergies as of 12/09/2021       Reactions   Codeine Itching   Dilaudid [hydromorphone] Other (See Comments)   Mouth blisters   Other Itching   Most pain meds cause itching.  When has to take pain medication, she has been instructed to take Benadryl   Hydrocodone-acetaminophen Other (See Comments)   Doxycycline Rash, Other (See Comments)        Medication List        Accurate as of December 09, 2021 11:46 AM. If you have any questions, ask your nurse or doctor.          acetaminophen 650 MG CR tablet Commonly known as: TYLENOL Take 650 mg by mouth in the morning.   albuterol 108 (90 Base) MCG/ACT inhaler Commonly known as: VENTOLIN HFA Inhale 2 puffs into the lungs every 6 (six) hours as needed  for wheezing or shortness of breath.   allopurinol 100 MG tablet Commonly known as: ZYLOPRIM Take 1 tablet (100 mg total) by mouth daily.   benzonatate 100 MG capsule Commonly known as: TESSALON Take by mouth 3 (three) times daily as needed for cough.   Blood Glucose Monitor System w/Device Kit Use glucose meter to check blood sugar up to 3 times daily as instructed. One Touch Verio preferred by insurance. 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   carvedilol 6.25 MG  tablet Commonly known as: COREG Take 1 tablet (6.25 mg total) by mouth 2 (two) times daily with a meal.   clopidogrel 75 MG tablet Commonly known as: PLAVIX Take 1 tablet (75 mg total) by mouth every evening.   Elderberry 500 MG Caps Take 500 mg by mouth daily.   famotidine 10 MG tablet Commonly known as: PEPCID Take 10 mg by mouth 2 (two) times daily.   Fish Oil Triple Strength 1400 MG Caps Take 1,000 mg by mouth 4 (four) times daily.   furosemide 40 MG tablet Commonly known as: LASIX Take 1 tablet (40 mg total) by mouth daily.   Lancets Misc. Kit Use patient & insurance preferred lancet device to test blood glucose up to 3 times daily for Dx: E11.69   levothyroxine 150 MCG tablet Commonly known as: Euthyrox Take 1 tablet (150 mcg total) by mouth daily.   losartan 25 MG tablet Commonly known as: COZAAR Take 1 tablet (25 mg total) by mouth daily.   OneTouch Verio test strip Generic drug: glucose blood USE 1 STRIP TO CHECK GLUCOSE UP TO THREE TIMES DAILY AS  INSTRUCTED   pantoprazole 40 MG tablet Commonly known as: Protonix Take 1 tablet (40 mg total) by mouth daily.   Vitamin D3 50 MCG (2000 UT) capsule Take 2,000 Units by mouth 2 (two) times daily.         Objective:   BP 126/71   Pulse 70   Temp 98.2 F (36.8 C)   Ht _0  (1.6 m)   Wt 241 lb (109.3 kg)   SpO2 96%   BMI 42.69 kg/m   Wt Readings from Last 3 Encounters:  12/09/21 241 lb (109.3 kg)  12/04/21 237 lb (107.5 kg)  10/19/21 237 lb 8 oz (107.7 kg)    Physical Exam Vitals and nursing note reviewed.  Constitutional:      General: She is not in acute distress.    Appearance: She is well-developed. She is not diaphoretic.  Eyes:     Conjunctiva/sclera: Conjunctivae normal.  Cardiovascular:     Rate and Rhythm: Normal rate and regular rhythm.     Heart sounds: Normal heart sounds. No murmur heard. Pulmonary:     Effort: Pulmonary effort is normal. No respiratory distress.     Breath  sounds: Normal breath sounds. No wheezing.  Musculoskeletal:        General: No swelling or tenderness. Normal range of motion.  Skin:    General: Skin is warm and dry.     Findings: No rash.  Neurological:     Mental Status: She is alert and oriented to person, place, and time.     Coordination: Coordination normal.  Psychiatric:        Behavior: Behavior normal.     Results for orders placed or performed in visit on 12/09/21  Bayer DCA Hb A1c Waived  Result Value Ref Range  HB A1C (BAYER DCA - WAIVED) 6.7 (H) 4.8 - 5.6 %    Assessment & Plan:   Problem List Items Addressed This Visit       Cardiovascular and Mediastinum   Hypertension associated with diabetes ([place]) - Primary   Relevant Medications   carvedilol (COREG) 6.25 MG tablet   losartan (COZAAR) 25 MG tablet   Other Relevant Orders   CBC with Differential/Platelet   CMP14+EGFR   Lipid panel   Bayer DCA Hb A1c Waived (Completed)   TSH     Endocrine   Hyperlipidemia associated with type 2 diabetes mellitus (HCC)   Relevant Medications   carvedilol (COREG) 6.25 MG tablet   losartan (COZAAR) 25 MG tablet   Other Relevant Orders   CBC with Differential/Platelet   CMP14+EGFR   Lipid panel   Bayer DCA Hb A1c Waived (Completed)   TSH   Hypothyroid   Relevant Medications   carvedilol (COREG) 6.25 MG tablet   Other Relevant Orders   CBC with Differential/Platelet   CMP14+EGFR   Lipid panel   Bayer DCA Hb A1c Waived (Completed)   TSH   Type 2 diabetes mellitus with other specified complication (HCC)   Relevant Medications   carvedilol (COREG) 6.25 MG tablet   losartan (COZAAR) 25 MG tablet   Other Relevant Orders   CBC with Differential/Platelet   CMP14+EGFR   Lipid panel   Bayer DCA Hb A1c Waived (Completed)   TSH   Other Visit Diagnoses     History of TIA (transient ischemic attack)       Relevant Medications   clopidogrel (PLAVIX) 75 MG tablet   Chronic gout of right foot, unspecified cause        Relevant Medications   allopurinol (ZYLOPRIM) 100 MG tablet   Sebaceous cyst           Patient's A1c looks good at 6.7, continue forward with diet and current medicines.  No change  Sebaceous cyst does not look infected today, recommended topical antibiotic to prevent infection and monitor Follow up plan: Return in about 6 months (around 06/11/2022), or if symptoms worsen or fail to improve, for Thyroid and hypertension and cholesterol recheck.  Counseling provided for all of the vaccine components Orders Placed This Encounter  Procedures   CBC with Differential/Platelet   CMP14+EGFR   Lipid panel   Bayer DCA Hb A1c Waived   TSH    Caryl Pina, MD [facility] 12/09/2021, 11:46 AM

## 2021-12-10 LAB — CBC WITH DIFFERENTIAL/PLATELET
Basophils Absolute: 0.1 10*3/uL (ref 0.0–0.2)
Basos: 1 %
EOS (ABSOLUTE): 0.3 10*3/uL (ref 0.0–0.4)
Eos: 4 %
Hematocrit: 38.9 % (ref 34.0–46.6)
Hemoglobin: 13 g/dL (ref 11.1–15.9)
Immature Grans (Abs): 0 10*3/uL (ref 0.0–0.1)
Immature Granulocytes: 0 %
Lymphocytes Absolute: 2.8 10*3/uL (ref 0.7–3.1)
Lymphs: 39 %
MCH: 30 pg (ref 26.6–33.0)
MCHC: 33.4 g/dL (ref 31.5–35.7)
MCV: 90 fL (ref 79–97)
Monocytes Absolute: 1 10*3/uL — ABNORMAL HIGH (ref 0.1–0.9)
Monocytes: 14 %
Neutrophils Absolute: 3.1 10*3/uL (ref 1.4–7.0)
Neutrophils: 42 %
Platelets: 254 10*3/uL (ref 150–450)
RBC: 4.33 x10E6/uL (ref 3.77–5.28)
RDW: 13.1 % (ref 11.7–15.4)
WBC: 7.2 10*3/uL (ref 3.4–10.8)

## 2021-12-10 LAB — CMP14+EGFR
ALT: 21 IU/L (ref 0–32)
AST: 21 IU/L (ref 0–40)
Albumin/Globulin Ratio: 1.2 (ref 1.2–2.2)
Albumin: 3.8 g/dL (ref 3.8–4.8)
Alkaline Phosphatase: 97 IU/L (ref 44–121)
BUN/Creatinine Ratio: 15 (ref 12–28)
BUN: 14 mg/dL (ref 8–27)
Bilirubin Total: 0.3 mg/dL (ref 0.0–1.2)
CO2: 25 mmol/L (ref 20–29)
Calcium: 9.2 mg/dL (ref 8.7–10.3)
Chloride: 101 mmol/L (ref 96–106)
Creatinine, Ser: 0.91 mg/dL (ref 0.57–1.00)
Globulin, Total: 3.1 g/dL (ref 1.5–4.5)
Glucose: 190 mg/dL — ABNORMAL HIGH (ref 70–99)
Potassium: 4 mmol/L (ref 3.5–5.2)
Sodium: 140 mmol/L (ref 134–144)
Total Protein: 6.9 g/dL (ref 6.0–8.5)
eGFR: 64 mL/min/{1.73_m2} (ref >59)

## 2021-12-10 LAB — LIPID PANEL
Chol/HDL Ratio: 4.2 ratio (ref 0.0–4.4)
Cholesterol, Total: 138 mg/dL (ref 100–199)
HDL: 33 mg/dL — ABNORMAL LOW (ref >39)
LDL Chol Calc (NIH): 66 mg/dL (ref 0–99)
Triglycerides: 243 mg/dL — ABNORMAL HIGH (ref 0–149)
VLDL Cholesterol Cal: 39 mg/dL (ref 5–40)

## 2021-12-10 LAB — TSH: TSH: 0.38 u[IU]/mL — ABNORMAL LOW (ref 0.450–4.500)

## 2021-12-15 ENCOUNTER — Other Ambulatory Visit: Payer: Self-pay | Admitting: Family Medicine

## 2021-12-15 DIAGNOSIS — Z1231 Encounter for screening mammogram for malignant neoplasm of breast: Secondary | ICD-10-CM

## 2021-12-21 ENCOUNTER — Ambulatory Visit (INDEPENDENT_AMBULATORY_CARE_PROVIDER_SITE_OTHER): Payer: PPO

## 2021-12-21 ENCOUNTER — Encounter: Payer: Self-pay | Admitting: Nurse Practitioner

## 2021-12-21 ENCOUNTER — Ambulatory Visit (INDEPENDENT_AMBULATORY_CARE_PROVIDER_SITE_OTHER): Payer: PPO | Admitting: Nurse Practitioner

## 2021-12-21 VITALS — BP 142/77 | HR 96 | Temp 98.2°F | Ht 64.0 in | Wt 240.0 lb

## 2021-12-21 DIAGNOSIS — M25562 Pain in left knee: Secondary | ICD-10-CM

## 2021-12-21 MED ORDER — PREDNISONE 10 MG PO TABS: 10.0000 mg | ORAL_TABLET | Freq: Every day | ORAL | 0 refills | Status: DC

## 2021-12-21 MED ORDER — METHYLPREDNISOLONE ACETATE 40 MG/ML IJ SUSP
40.0000 mg | Freq: Once | INTRAMUSCULAR | Status: AC
  Administered 2021-12-21: 40 mg via INTRAMUSCULAR

## 2021-12-21 NOTE — Progress Notes (Signed)
Acute Office Visit  Subjective:     Patient ID: Karen Potter, female    DOB: [birth year], 81 y.o.   MRN: [id number]  Chief Complaint  Patient presents with   Knee Pain    Left knee pain    HPI Knee Pain: Patient presents with knee pain involving the  left knee. Onset of the symptoms was yesterday. Inciting event: none known. Current symptoms include pain located lateral knee . Pain is aggravated by walking.  Patient has had no prior knee problems. Evaluation to date: none. Treatment to date: none.   Review of Systems  Constitutional: Negative.  Negative for chills and fever.  HENT:  Negative for hearing loss.   Respiratory: Negative.    Cardiovascular: Negative.   Genitourinary: Negative.   Musculoskeletal:  Positive for joint pain.  Skin: Negative.  Negative for itching and rash.  All other systems reviewed and are negative.       Objective:    BP (!) 142/77   Pulse 96   Temp 98.2 F (36.8 C)   Ht '5\' 4"'$  (1.626 m)   Wt 240 lb (108.9 kg)   SpO2 (!) 66%   BMI 41.20 kg/m  BP Readings from Last 3 Encounters:  12/21/21 (!) 142/77  12/09/21 126/71  10/19/21 105/62   Wt Readings from Last 3 Encounters:  12/21/21 240 lb (108.9 kg)  12/09/21 241 lb (109.3 kg)  12/04/21 237 lb (107.5 kg)      Physical Exam Vitals and nursing note reviewed.  Constitutional:      Appearance: Normal appearance.  HENT:     Head: Normocephalic.     Right Ear: External ear normal.     Left Ear: External ear normal.     Nose: Nose normal.     Mouth/Throat:     Mouth: Mucous membranes are moist.     Pharynx: Oropharynx is clear.  Eyes:     Conjunctiva/sclera: Conjunctivae normal.  Cardiovascular:     Rate and Rhythm: Normal rate and regular rhythm.     Pulses: Normal pulses.     Heart sounds: Normal heart sounds.  Pulmonary:     Effort: Pulmonary effort is normal.     Breath sounds: Normal breath sounds.  Abdominal:     General: Bowel sounds are normal.  Musculoskeletal:      Left knee: Erythema present. Decreased range of motion. Tenderness present. No MCL, LCL, ACL or PCL tenderness. No MCL laxity or ACL laxity.      Legs:     Comments: Acute knee pain  Neurological:     Mental Status: She is alert and oriented to person, place, and time.  Psychiatric:        Behavior: Behavior normal.     No results found for any visits on 12/21/21.      Assessment & Plan:  Patient presents with acute left knee pain with unknown etiology.  Pain is a 9 out of 10.  Worsened with movement and standing. Apply ice, rest knee joint, completed knee x-ray, Depo-Medrol shot given in clinic.  Prednisone 10 mg tablet by mouth daily for 6 days.  Education handout given to patient.  Follow-up with unresolved symptoms. Problem List Items Addressed This Visit   None Visit Diagnoses     Acute pain of left knee    -  Primary   Relevant Medications   predniSONE (DELTASONE) 10 MG tablet   methylPREDNISolone acetate (DEPO-MEDROL) injection 40 mg (Completed)   Other Relevant  Orders   DG Knee 1-2 Views Left       Meds ordered this encounter  Medications   predniSONE (DELTASONE) 10 MG tablet    Sig: Take 1 tablet (10 mg total) by mouth daily with breakfast.    Dispense:  6 tablet    Refill:  0    Order Specific Question:   Supervising Provider    Answer:   Claretta Fraise [[id number]]   methylPREDNISolone acetate (DEPO-MEDROL) injection 40 mg    Return if symptoms worsen or fail to improve, for left knee pain.  Ivy Lynn, NP

## 2021-12-21 NOTE — Patient Instructions (Signed)
Acute Knee Pain, Adult Many things can cause knee pain. Sometimes, knee pain is sudden (acute) and may be caused by damage, swelling, or irritation of the muscles and tissues that support your knee. The pain often goes away on its own with time and rest. If the pain does not go away, tests may be done to find out what is causing the pain. Follow these instructions at home: If you have a knee sleeve or brace:  Wear the knee sleeve or brace as told by your doctor. Take it off only as told by your doctor. Loosen it if your toes: Tingle. Become numb. Turn cold and blue. Keep it clean. If the knee sleeve or brace is not waterproof: Do not let it get wet. Cover it with a watertight covering when you take a bath or shower. Activity Rest your knee. Do not do things that cause pain or make pain worse. Avoid activities where both feet leave the ground at the same time (high-impact activities). Examples are running, jumping rope, and doing jumping jacks. Work with a physical therapist to make a safe exercise program, as told by your doctor. Managing pain, stiffness, and swelling  If told, put ice on the knee. To do this: If you have a removable knee sleeve or brace, take it off as told by your doctor. Put ice in a plastic bag. Place a towel between your skin and the bag. Leave the ice on for 20 minutes, 2-3 times a day. Take off the ice if your skin turns bright red. This is very important. If you cannot feel pain, heat, or cold, you have a greater risk of damage to the area. If told, use an elastic bandage to put pressure (compression) on your injured knee. Raise your knee above the level of your heart while you are sitting or lying down. Sleep with a pillow under your knee. General instructions Take over-the-counter and prescription medicines only as told by your doctor. Do not smoke or use any products that contain nicotine or tobacco. If you need help quitting, ask your doctor. If you are  overweight, work with your doctor and a food expert (dietitian) to set goals to lose weight. Being overweight can make your knee hurt more. Watch for any changes in your symptoms. Keep all follow-up visits. Contact a doctor if: The knee pain does not stop. The knee pain changes or gets worse. You have a fever along with knee pain. Your knee is red or feels warm when you touch it. Your knee gives out or locks up. Get help right away if: Your knee swells, and the swelling gets worse. You cannot move your knee. You have very bad knee pain that does not get better with pain medicine. Summary Many things can cause knee pain. The pain often goes away on its own with time and rest. Your doctor may do tests to find out the cause of the pain. Watch for any changes in your symptoms. Relieve your pain with rest, medicines, light activity, and use of ice. Get help right away if you cannot move your knee or your knee pain is very bad. This information is not intended to replace advice given to you by your health care provider. Make sure you discuss any questions you have with your health care provider. Document Revised: 10/10/2019 Document Reviewed: 10/10/2019 Elsevier Patient Education  2023 [employer] Inc[employer]  

## 2021-12-28 ENCOUNTER — Other Ambulatory Visit: Payer: Self-pay | Admitting: Family Medicine

## 2021-12-30 ENCOUNTER — Ambulatory Visit (INDEPENDENT_AMBULATORY_CARE_PROVIDER_SITE_OTHER): Payer: PPO | Admitting: Family Medicine

## 2021-12-30 ENCOUNTER — Encounter: Payer: Self-pay | Admitting: Family Medicine

## 2021-12-30 ENCOUNTER — Other Ambulatory Visit: Payer: Self-pay | Admitting: Family Medicine

## 2021-12-30 DIAGNOSIS — B372 Candidiasis of skin and nail: Secondary | ICD-10-CM

## 2021-12-30 MED ORDER — CLOTRIMAZOLE-BETAMETHASONE 1-0.05 % EX CREA: 1.0000 | TOPICAL_CREAM | Freq: Every day | CUTANEOUS | 2 refills | Status: DC

## 2021-12-30 MED ORDER — NYSTATIN 100000 UNIT/GM EX POWD: 1.0000 | Freq: Three times a day (TID) | CUTANEOUS | 1 refills | Status: DC

## 2021-12-30 NOTE — Progress Notes (Signed)
Virtual Visit via telephone Note  I connected with Karen Potter on 12/30/21 at 1618 by telephone and verified that I am speaking with the correct person using two identifiers. Karen Potter is currently located at home and patient are currently with her during visit. The provider, Fransisca Kaufmann Atheena Spano, MD is located in their office at time of visit.  Call ended at 1624  I discussed the limitations, risks, security and privacy concerns of performing an evaluation and management service by telephone and the availability of in person appointments. I also discussed with the patient that there may be a patient responsible charge related to this service. The patient expressed understanding and agreed to proceed.   History and Present Illness: Patient is calling in for yeast rash under her belly and is using antiperspirant and it is worsening. She had a cream and was using the cream and ran out. She said she is going to the beach and wanted to get the medication.  She fights this off and on but over the past couple months it has been worsening because of the summertime.  Outpatient Encounter Medications as of 12/30/2021  Medication Sig   clotrimazole-betamethasone (LOTRISONE) cream Apply 1 Application topically daily.   nystatin (MYCOSTATIN/NYSTOP) powder Apply 1 Application topically 3 (three) times daily.   acetaminophen (TYLENOL) 650 MG CR tablet Take 650 mg by mouth in the morning.   albuterol (VENTOLIN HFA) 108 (90 Base) MCG/ACT inhaler Inhale 2 puffs into the lungs every 6 (six) hours as needed for wheezing or shortness of breath.   allopurinol (ZYLOPRIM) 100 MG tablet Take 1 tablet (100 mg total) by mouth daily.   benzonatate (TESSALON) 100 MG capsule Take by mouth 3 (three) times daily as needed for cough.   Blood Glucose Monitoring Suppl (BLOOD GLUCOSE MONITOR SYSTEM) w/Device KIT Use glucose meter to check blood sugar up to 3 times daily as instructed. One Touch Verio preferred by  insurance. 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   carvedilol (COREG) 6.25 MG tablet Take 1 tablet (6.25 mg total) by mouth 2 (two) times daily with a meal.   Cholecalciferol (VITAMIN D3) 2000 UNITS capsule Take 2,000 Units by mouth 2 (two) times daily.    clopidogrel (PLAVIX) 75 MG tablet Take 1 tablet (75 mg total) by mouth every evening.   Elderberry 500 MG CAPS Take 500 mg by mouth daily.   famotidine (PEPCID) 10 MG tablet Take 10 mg by mouth 2 (two) times daily.   furosemide (LASIX) 40 MG tablet Take 1 tablet (40 mg total) by mouth daily.   Lancets Misc. KIT Use patient & insurance preferred lancet device to test blood glucose up to 3 times daily for Dx: E11.69   levothyroxine (EUTHYROX) 150 MCG tablet Take 1 tablet (150 mcg total) by mouth daily.   losartan (COZAAR) 25 MG tablet Take 1 tablet (25 mg total) by mouth daily.   Omega-3 Fatty Acids (FISH OIL TRIPLE STRENGTH) 1400 MG CAPS Take 1,000 mg by mouth 4 (four) times daily.    ONETOUCH VERIO test strip USE 1 STRIP TO CHECK GLUCOSE UP TO THREE TIMES DAILY AS  INSTRUCTED   pantoprazole (PROTONIX) 40 MG tablet Take 1 tablet (40 mg total) by mouth daily.   predniSONE (DELTASONE) 10 MG tablet Take 1 tablet (10 mg total) by mouth daily with breakfast.   No facility-administered encounter medications on file as of 12/30/2021.  Review of Systems  Constitutional:  Negative for chills and fever.  Respiratory:  Negative for chest tightness and shortness of breath.   Cardiovascular:  Negative for chest pain and leg swelling.  Skin:  Positive for color change and rash. Negative for wound.  All other systems reviewed and are negative.   Observations/Objective: Patient sounds comfortable and in no acute distress  Assessment and Plan: Problem List Items Addressed This Visit   None Visit Diagnoses     Yeast dermatitis    -  Primary   Relevant Medications    clotrimazole-betamethasone (LOTRISONE) cream   nystatin (MYCOSTATIN/NYSTOP) powder     We will give yeast powder and yeast cream to see if they can help.  And if not improved in a couple weeks and we may try the pills for antifungal.  Follow up plan: Return if symptoms worsen or fail to improve.     I discussed the assessment and treatment plan with the patient. The patient was provided an opportunity to ask questions and all were answered. The patient agreed with the plan and demonstrated an understanding of the instructions.   The patient was advised to call back or seek an in-person evaluation if the symptoms worsen or if the condition fails to improve as anticipated.  The above assessment and management plan was discussed with the patient. The patient verbalized understanding of and has agreed to the management plan. Patient is aware to call the clinic if symptoms persist or worsen. Patient is aware when to return to the clinic for a follow-up visit. Patient educated on when it is appropriate to go to the emergency department.    I provided 6 minutes of non-face-to-face time during this encounter.    Worthy Rancher, MD

## 2021-12-31 ENCOUNTER — Ambulatory Visit
Admission: RE | Admit: 2021-12-31 | Discharge: 2021-12-31 | Disposition: A | Discharge status: Home / Self Care | Payer: PPO | Source: Ambulatory Visit | Attending: Family Medicine | Admitting: Family Medicine

## 2021-12-31 DIAGNOSIS — Z1231 Encounter for screening mammogram for malignant neoplasm of breast: Secondary | ICD-10-CM | POA: Diagnosis not present

## 2022-01-13 ENCOUNTER — Encounter: Payer: Self-pay | Admitting: Family Medicine

## 2022-01-13 ENCOUNTER — Ambulatory Visit (INDEPENDENT_AMBULATORY_CARE_PROVIDER_SITE_OTHER): Payer: PPO | Admitting: Family Medicine

## 2022-01-13 DIAGNOSIS — L723 Sebaceous cyst: Secondary | ICD-10-CM | POA: Diagnosis not present

## 2022-01-13 DIAGNOSIS — L089 Local infection of the skin and subcutaneous tissue, unspecified: Secondary | ICD-10-CM

## 2022-01-13 MED ORDER — CEPHALEXIN 500 MG PO CAPS: 500.0000 mg | ORAL_CAPSULE | Freq: Four times a day (QID) | ORAL | 0 refills | Status: DC

## 2022-01-13 NOTE — Progress Notes (Signed)
There were no vitals taken for this visit.   Subjective:   Patient ID: Karen Potter, female    DOB: 12-10-1940, 81 y.o.   MRN: [id number]  HPI: Karen LECOMPTE is a 81 y.o. female presenting on 01/13/2022 for No chief complaint on file.   HPI Patient has a sebaceous cyst on her back that she has had for some time but recently started getting red and inflamed and painful and draining more.  She says it really started flaring up like this over the past couple weeks  Relevant past medical, surgical, family and social history reviewed and updated as indicated. Interim medical history since our last visit reviewed. Allergies and medications reviewed and updated.  Review of Systems  Constitutional:  Negative for chills and fever.  Skin:  Positive for color change, rash and wound.    Per HPI unless specifically indicated above   Allergies as of 01/13/2022       Reactions   Codeine Itching   Dilaudid [hydromorphone] Other (See Comments)   Mouth blisters   Other Itching   Most pain meds cause itching.  When has to take pain medication, she has been instructed to take Benadryl   Hydrocodone-acetaminophen Other (See Comments)   Doxycycline Rash, Other (See Comments)        Medication List        Accurate as of January 13, 2022  4:13 PM. If you have any questions, ask your nurse or doctor.          acetaminophen 650 MG CR tablet Commonly known as: TYLENOL Take 650 mg by mouth in the morning.   albuterol 108 (90 Base) MCG/ACT inhaler Commonly known as: VENTOLIN HFA Inhale 2 puffs into the lungs every 6 (six) hours as needed for wheezing or shortness of breath.   allopurinol 100 MG tablet Commonly known as: ZYLOPRIM Take 1 tablet (100 mg total) by mouth daily.   benzonatate 100 MG capsule Commonly known as: TESSALON Take by mouth 3 (three) times daily as needed for cough.   Blood Glucose Monitor System w/Device Kit Use glucose meter to check blood sugar up to 3  times daily as instructed. One Touch Verio preferred by insurance. 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   carvedilol 6.25 MG tablet Commonly known as: COREG Take 1 tablet (6.25 mg total) by mouth 2 (two) times daily with a meal.   cephALEXin 500 MG capsule Commonly known as: KEFLEX Take 1 capsule (500 mg total) by mouth 4 (four) times daily. Started by: Worthy Rancher, MD   clopidogrel 75 MG tablet Commonly known as: PLAVIX Take 1 tablet (75 mg total) by mouth every evening.   clotrimazole-betamethasone cream Commonly known as: LOTRISONE Apply 1 Application topically daily.   Elderberry 500 MG Caps Take 500 mg by mouth daily.   famotidine 10 MG tablet Commonly known as: PEPCID Take 10 mg by mouth 2 (two) times daily.   Fish Oil Triple Strength 1400 MG Caps Take 1,000 mg by mouth 4 (four) times daily.   furosemide 40 MG tablet Commonly known as: LASIX Take 1 tablet (40 mg total) by mouth daily.   Lancets Misc. Kit Use patient & insurance preferred lancet device to test blood glucose up to 3 times daily for Dx: E11.69   levothyroxine 150 MCG tablet Commonly known as: Euthyrox Take 1 tablet (150 mcg total) by mouth daily.   losartan  25 MG tablet Commonly known as: COZAAR Take 1 tablet (25 mg total) by mouth daily.   nystatin powder Commonly known as: MYCOSTATIN/NYSTOP Apply 1 Application topically 3 (three) times daily.   OneTouch Verio test strip Generic drug: glucose blood USE 1 STRIP TO CHECK GLUCOSE UP TO THREE TIMES DAILY AS  INSTRUCTED   pantoprazole 40 MG tablet Commonly known as: Protonix Take 1 tablet (40 mg total) by mouth daily.   predniSONE 10 MG tablet Commonly known as: DELTASONE Take 1 tablet (10 mg total) by mouth daily with breakfast.   Vitamin D3 50 MCG (2000 UT) capsule Take 2,000 Units by mouth 2 (two) times daily.         Objective:    There were no vitals taken for this visit.  Wt Readings from Last 3 Encounters:  12/21/21 240 lb (108.9 kg)  12/09/21 241 lb (109.3 kg)  12/04/21 237 lb (107.5 kg)    Physical Exam Vitals and nursing note reviewed.  Constitutional:      Appearance: Normal appearance.  Skin:    Findings: Erythema, lesion and wound present.       Neurological:     Mental Status: She is alert.       Assessment & Plan:   Problem List Items Addressed This Visit   None Visit Diagnoses     Infected sebaceous cyst    -  Primary   Relevant Medications   cephALEXin (KEFLEX) 500 MG capsule       We will give antibiotic to treat inflammation infection but recommended that in the future possibly she go to a dermatologist and get it removed completely if it continues to cause issues Follow up plan: Return if symptoms worsen or fail to improve.  Counseling provided for all of the vaccine components No orders of the defined types were placed in this encounter.   Caryl Pina, MD [facility] 01/13/2022, 4:13 PM

## 2022-01-20 ENCOUNTER — Telehealth: Payer: Self-pay | Admitting: Family Medicine

## 2022-01-20 NOTE — Telephone Encounter (Signed)
Keep expressing the drainage, that is good that it is draining, keep taking the antibiotic.  Make her an appointment to come back and be seen around the time or a few days after the antibiotic is finished but for now I would just have her keep gauze over top of it and tape it over top of it and then when she takes a shower every day to try and squeeze it and keep it draining.

## 2022-01-20 NOTE — Telephone Encounter (Signed)
Pt called to let Dr Dettinger know that when she woke up this morning she had drainage all the way down almost to her waist, coming from the wound on her back. Pt says she is taking the antibiotic that he prescribed to her but needs advise on what else to do.

## 2022-01-20 NOTE — Telephone Encounter (Signed)
Pt aware and scheduled.

## 2022-01-22 ENCOUNTER — Ambulatory Visit (INDEPENDENT_AMBULATORY_CARE_PROVIDER_SITE_OTHER): Payer: PPO | Admitting: Family Medicine

## 2022-01-22 ENCOUNTER — Encounter: Payer: Self-pay | Admitting: Family Medicine

## 2022-01-22 VITALS — BP 135/77 | HR 69 | Temp 98.6°F | Ht 64.0 in | Wt 241.0 lb

## 2022-01-22 DIAGNOSIS — L723 Sebaceous cyst: Secondary | ICD-10-CM

## 2022-01-22 DIAGNOSIS — Z23 Encounter for immunization: Secondary | ICD-10-CM

## 2022-01-22 MED ORDER — SULFAMETHOXAZOLE-TRIMETHOPRIM 800-160 MG PO TABS: 1.0000 | ORAL_TABLET | Freq: Two times a day (BID) | ORAL | 0 refills | Status: DC

## 2022-01-22 NOTE — Progress Notes (Signed)
BP 135/77   Pulse 69   Temp 98.6 F (37 C)   Ht '5\' 4"'  (1.626 m)   Wt 241 lb (109.3 kg)   SpO2 96%   BMI 41.37 kg/m    Subjective:   Patient ID: Karen Potter, female    DOB: February 06, 1941, 81 y.o.   MRN: [id number]  HPI: Karen Potter is a 81 y.o. female presenting on 01/22/2022 for Cyst (Back- wants removed)   HPI Patient is coming in for recheck for sebaceous cyst, she was seen a couple months for this and was given an antibiotic and its been draining since and it was getting better but then it worsened again.  She says she ended up with some much drainage the other day and it went down her back.  Relevant past medical, surgical, family and social history reviewed and updated as indicated. Interim medical history since our last visit reviewed. Allergies and medications reviewed and updated.  Review of Systems  Constitutional:  Negative for chills and fever.  Eyes:  Negative for visual disturbance.  Respiratory:  Negative for chest tightness and shortness of breath.   Cardiovascular:  Negative for chest pain and leg swelling.  Musculoskeletal:  Negative for back pain and gait problem.  Skin:  Positive for color change and rash.  Neurological:  Negative for light-headedness and headaches.  Psychiatric/Behavioral:  Negative for agitation and behavioral problems.   All other systems reviewed and are negative.   Per HPI unless specifically indicated above   Allergies as of 01/22/2022       Reactions   Codeine Itching   Dilaudid [hydromorphone] Other (See Comments)   Mouth blisters   Other Itching   Most pain meds cause itching.  When has to take pain medication, she has been instructed to take Benadryl   Hydrocodone-acetaminophen Other (See Comments)   Doxycycline Rash, Other (See Comments)        Medication List        Accurate as of January 22, 2022  5:04 PM. If you have any questions, ask your nurse or doctor.          STOP taking these medications     cephALEXin 500 MG capsule Commonly known as: KEFLEX Stopped by: Fransisca Kaufmann Blondie Riggsbee, MD       TAKE these medications    acetaminophen 650 MG CR tablet Commonly known as: TYLENOL Take 650 mg by mouth in the morning.   albuterol 108 (90 Base) MCG/ACT inhaler Commonly known as: VENTOLIN HFA Inhale 2 puffs into the lungs every 6 (six) hours as needed for wheezing or shortness of breath.   allopurinol 100 MG tablet Commonly known as: ZYLOPRIM Take 1 tablet (100 mg total) by mouth daily.   benzonatate 100 MG capsule Commonly known as: TESSALON Take by mouth 3 (three) times daily as needed for cough.   Blood Glucose Monitor System w/Device Kit Use glucose meter to check blood sugar up to 3 times daily as instructed. One Touch Verio preferred by insurance. 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   carvedilol 6.25 MG tablet Commonly known as: COREG Take 1 tablet (6.25 mg total) by mouth 2 (two) times daily with a meal.   clopidogrel 75 MG tablet Commonly known as: PLAVIX Take 1 tablet (75 mg total) by mouth every evening.   clotrimazole-betamethasone cream Commonly known as: LOTRISONE Apply 1 Application topically daily.   Elderberry 500  MG Caps Take 500 mg by mouth daily.   famotidine 10 MG tablet Commonly known as: PEPCID Take 10 mg by mouth 2 (two) times daily.   Fish Oil Triple Strength 1400 MG Caps Take 1,000 mg by mouth 4 (four) times daily.   furosemide 40 MG tablet Commonly known as: LASIX Take 1 tablet (40 mg total) by mouth daily.   Lancets Misc. Kit Use patient & insurance preferred lancet device to test blood glucose up to 3 times daily for Dx: E11.69   levothyroxine 150 MCG tablet Commonly known as: Euthyrox Take 1 tablet (150 mcg total) by mouth daily.   losartan 25 MG tablet Commonly known as: COZAAR Take 1 tablet (25 mg total) by mouth daily.   nystatin  powder Commonly known as: MYCOSTATIN/NYSTOP Apply 1 Application topically 3 (three) times daily.   OneTouch Verio test strip Generic drug: glucose blood USE 1 STRIP TO CHECK GLUCOSE UP TO THREE TIMES DAILY AS  INSTRUCTED   pantoprazole 40 MG tablet Commonly known as: Protonix Take 1 tablet (40 mg total) by mouth daily.   predniSONE 10 MG tablet Commonly known as: DELTASONE Take 1 tablet (10 mg total) by mouth daily with breakfast.   sulfamethoxazole-trimethoprim 800-160 MG tablet Commonly known as: BACTRIM DS Take 1 tablet by mouth 2 (two) times daily. Started by: Worthy Rancher, MD   Vitamin D3 50 MCG (2000 UT) capsule Take 2,000 Units by mouth 2 (two) times daily.         Objective:   BP 135/77   Pulse 69   Temp 98.6 F (37 C)   Ht '5\' 4"'  (1.626 m)   Wt 241 lb (109.3 kg)   SpO2 96%   BMI 41.37 kg/m   Wt Readings from Last 3 Encounters:  01/22/22 241 lb (109.3 kg)  12/21/21 240 lb (108.9 kg)  12/09/21 241 lb (109.3 kg)    Physical Exam Vitals and nursing note reviewed.  Constitutional:      Appearance: Normal appearance.  Skin:    General: Skin is warm and dry.     Findings: Lesion (Inflamed sebaceous cyst on upper back.  Drainage is straw-colored.) present.  Neurological:     Mental Status: She is alert.       Assessment & Plan:   Problem List Items Addressed This Visit   None Visit Diagnoses     Sebaceous cyst    -  Primary   Relevant Medications   sulfamethoxazole-trimethoprim (BACTRIM DS) 800-160 MG tablet   Other Relevant Orders   Ambulatory referral to General Surgery     Due to the size of it I referred her to general surgeon given an antibiotic.  Think would be better managed by a surgeon  Follow up plan: Return if symptoms worsen or fail to improve.  Counseling provided for all of the vaccine components Orders Placed This Encounter  Procedures   Ambulatory referral to [place] Alenah Sarria, MD [place] 01/22/2022, 5:04 PM

## 2022-01-22 NOTE — Patient Instructions (Addendum)
[facility] [place], [place] Address: [address] [place], [place] [address] Phone: [phone]

## 2022-02-03 DIAGNOSIS — Z961 Presence of intraocular lens: Secondary | ICD-10-CM | POA: Diagnosis not present

## 2022-02-03 DIAGNOSIS — H35033 Hypertensive retinopathy, bilateral: Secondary | ICD-10-CM | POA: Diagnosis not present

## 2022-02-03 DIAGNOSIS — E119 Type 2 diabetes mellitus without complications: Secondary | ICD-10-CM | POA: Diagnosis not present

## 2022-02-03 DIAGNOSIS — H524 Presbyopia: Secondary | ICD-10-CM | POA: Diagnosis not present

## 2022-02-03 DIAGNOSIS — H5203 Hypermetropia, bilateral: Secondary | ICD-10-CM | POA: Diagnosis not present

## 2022-02-03 DIAGNOSIS — H52223 Regular astigmatism, bilateral: Secondary | ICD-10-CM | POA: Diagnosis not present

## 2022-02-12 ENCOUNTER — Other Ambulatory Visit: Payer: Self-pay | Admitting: Family Medicine

## 2022-02-18 ENCOUNTER — Ambulatory Visit: Payer: PPO | Admitting: General Surgery

## 2022-02-18 ENCOUNTER — Encounter: Payer: Self-pay | Admitting: General Surgery

## 2022-02-18 VITALS — BP 142/80 | HR 63 | Temp 97.8°F | Resp 14 | Ht 64.0 in | Wt 244.0 lb

## 2022-02-18 DIAGNOSIS — L723 Sebaceous cyst: Secondary | ICD-10-CM | POA: Diagnosis not present

## 2022-02-18 NOTE — Progress Notes (Signed)
[facility] History and Physical  Reason for Referral: Cyst on back  Referring Physician: Dettinger, Fransisca Kaufmann, MD    Chief Complaint   New Patient (Initial Visit)     Karen Potter is a 81 y.o. female.  HPI: Karen Potter is an 81 yo with a sebaceous cyst on Karen Potter back that has been draining and required multiple rounds of antibiotics in the last few months. Karen Potter reports having to keep the area covered and Karen Potter husband has been caring for it.   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   age 38--  pt states was quarantined   no treatment ;  per pt no symptoms or issues since   History of kidney stones    History of TIA (transient ischemic attack)    03-23-2014  w/ episode temporay amnesia   Hyperlipidemia    Hypertension    Hypothyroidism    Iron deficiency anemia    LAFB (left anterior fascicular block)    OA (osteoarthritis)    OSA on CPAP    severe per study 2003   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   Post-polio limb muscle weakness    left leg  w/ 3 repair surgery's   RBBB (right bundle branch block)    Seasonal allergies    Type 2 diabetes mellitus ([place])    Vitamin D deficiency     Past Surgical History:  Procedure Laterality Date   CARPAL TUNNEL RELEASE Right 05-10-2007   CHOLECYSTECTOMY OPEN  1985   and Appendectomy   CYSTO/  RIGHT URETEROSCOPIC STONE EXTRACTON/  STENT PLACEMENT  06/ 2016   in [place]   CYSTOSCOPY/RETROGRADE/URETEROSCOPY/STONE EXTRACTION WITH BASKET Right 11/17/2015   Procedure: CYSTOSCOPY/RETROGRADE  PYELOGRAM RIGHT/DIAGNOSTIC RIGHT URETEROSCOPY/LASER RENAL CALCIFICATION/RIGHT STENT PLACEMENT;  Surgeon: Cleon Gustin, MD;  Location: [facility];  Service: Urology;  Laterality: Right;   ESOPHAGOGASTRODUODENOSCOPY N/A 08/29/2015   Procedure: ESOPHAGOGASTRODUODENOSCOPY (EGD);  Surgeon: Danie Binder, MD;  [facility];  Service: Endoscopy;  Laterality: N/A;   HOLMIUM LASER APPLICATION Right 11/08/7791   Procedure: HOLMIUM LASER APPLICATION;  Surgeon: Cleon Gustin, MD;  Location: [facility];  Service: Urology;  Laterality: Right;   KNEE ARTHROSCOPY Right [place];  1985;  1995   polio   LUMBAR SPINE SURGERY  09-01-2010   L4 -L5 fusion,  L3 laminectomy,  L5 - S1 foraminotomy   OVARIAN CYST SURGERY Right 1966   PARATHYROIDECTOMY N/A 05/06/2015   Procedure: PARATHYROIDECTOMY;  Surgeon: Armandina Gemma, MD;  Location: [facility];  Service: General;  Laterality: N/A;   left superior   TONSILLECTOMY  as child   TOTAL ABDOMINAL HYSTERECTOMY W/ BILATERAL SALPINGOOPHORECTOMY  1984   TOTAL KNEE ARTHROPLASTY Right 04-08-2008   TRANSTHORACIC ECHOCARDIOGRAM  04-07-2012   grade 1 diastolic function,  ef 90-30%/  mild AV calcification without stenosis/  trivial MR    Family  History  Problem Relation Age of Onset   Heart failure Mother        No details.  No MI   Cancer Father        Lung   Cancer Brother        Lung   Stroke Maternal Grandfather    Cancer Paternal Grandmother        rectal   Cancer Paternal Grandfather        gastric    Social History   Tobacco Use   Smoking status: Never   Smokeless tobacco: Never  Vaping Use   Vaping Use: Never used  Substance Use Topics   Alcohol use: No   Drug use: No    Medications: I have reviewed the patient's current medications. Allergies as of 02/18/2022       Reactions   Codeine Itching   Dilaudid [hydromorphone] Other (See Comments)   Mouth blisters   Other  Itching   Most pain meds cause itching.  When has to take pain medication, Karen Potter has been instructed to take Benadryl   Hydrocodone-acetaminophen Other (See Comments)   Doxycycline Rash, Other (See Comments)        Medication List        Accurate as of February 18, 2022 12:04 PM. If you have any questions, ask your nurse or doctor.          STOP taking these medications    benzonatate 100 MG capsule Commonly known as: TESSALON Stopped by: Virl Cagey, MD   predniSONE 10 MG tablet Commonly known as: DELTASONE Stopped by: Virl Cagey, MD   sulfamethoxazole-trimethoprim 800-160 MG tablet Commonly known as: BACTRIM DS Stopped by: Virl Cagey, MD       TAKE these medications    acetaminophen 650 MG CR tablet Commonly known as: TYLENOL Take 650 mg by mouth in the morning.   albuterol 108 (90 Base) MCG/ACT inhaler Commonly known as: VENTOLIN HFA Inhale 2 puffs into the lungs every 6 (six) hours as needed for wheezing or shortness of breath.   allopurinol 100 MG tablet Commonly known as: ZYLOPRIM Take 1 tablet (100 mg total) by mouth daily.   Blood Glucose Monitor System w/Device Kit Use glucose meter to check blood sugar up to 3 times daily as instructed. One Touch Verio preferred by insurance. 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   carvedilol 6.25 MG tablet Commonly known as: COREG Take 1 tablet (6.25 mg total) by mouth 2 (two) times daily with a meal.   clopidogrel 75 MG tablet Commonly known as: PLAVIX Take 1 tablet (75 mg total) by mouth every evening.   clotrimazole-betamethasone cream Commonly known as: LOTRISONE Apply 1 Application topically daily.   Elderberry 500 MG Caps Take 500 mg by mouth daily.   famotidine 10 MG tablet Commonly known as: PEPCID Take 10 mg by mouth 2 (two) times daily.   Fish Oil Triple Strength 1400 MG Caps Take 1,000 mg  by mouth 4 (four) times daily.   furosemide 40 MG tablet Commonly known as: LASIX Take 1 tablet by mouth once daily   Lancets Misc. Kit Use patient & insurance preferred lancet device to test blood glucose up to 3 times daily for Dx: E11.69   levothyroxine 150 MCG tablet Commonly known as: Euthyrox Take 1 tablet (150 mcg total) by mouth daily.   losartan 25 MG tablet Commonly known as: COZAAR Take  1 tablet (25 mg total) by mouth daily.   nystatin powder Commonly known as: MYCOSTATIN/NYSTOP Apply 1 Application topically 3 (three) times daily.   OneTouch Verio test strip Generic drug: glucose blood USE 1 STRIP TO CHECK GLUCOSE UP TO THREE TIMES DAILY AS  INSTRUCTED   pantoprazole 40 MG tablet Commonly known as: Protonix Take 1 tablet (40 mg total) by mouth daily.   Vitamin D3 50 MCG (2000 UT) capsule Take 2,000 Units by mouth 2 (two) times daily.         ROS:  A comprehensive review of systems was negative except for: Hematologic/lymphatic: positive for skin rash, cyst on back Musculoskeletal: positive for back pain  Blood pressure (!) 142/80, pulse 63, temperature 97.8 F (36.6 C), temperature source Oral, resp. rate 14, height _0  (1.626 m), weight 244 lb (110.7 kg), SpO2 94 %. Physical Exam Vitals reviewed.  HENT:     Head: Normocephalic.     Nose: Nose normal.  Eyes:     Extraocular Movements: Extraocular movements intact.  Cardiovascular:     Rate and Rhythm: Normal rate and regular rhythm.  Pulmonary:     Effort: Pulmonary effort is normal.     Breath sounds: Normal breath sounds.  Abdominal:     General: There is no distension.     Palpations: Abdomen is soft.     Tenderness: There is no abdominal tenderness.  Musculoskeletal:        General: Normal range of motion.     Comments: Back upper mid line with 3-4cm area of red scaly, dry skin and central pit, likely cyst is 2cm   Skin:    General: Skin is warm.  Neurological:     General: No focal  deficit present.     Mental Status: Karen Potter is alert and oriented to person, place, and time.  Psychiatric:        Mood and Affect: Mood normal.        Behavior: Behavior normal.        Thought Content: Thought content normal.        Judgment: Judgment normal.     Results: None    Assessment & Plan:  MITZI LILJA is a 81 y.o. female with a sebaceous cyst on the back. We discussed excision in the office with local and risk of bleeding, infection, recurrence, having sutures that need to be removed.   Karen Potter wants it done in a few weeks.   Future Appointments  Date Time Provider [place]  03/22/2022 10:55 AM Dettinger, Fransisca Kaufmann, MD [facility] None  03/25/2022  3:00 PM Virl Cagey, MD [facility] None  06/16/2022  2:30 PM Ward Givens, NP [facility] None  12/08/2022  1:15 PM [facility]-ANNUAL WELLNESS VISIT [facility] None     All questions were answered to the satisfaction of the patient and family.    Virl Cagey 02/18/2022, 12:04 PM

## 2022-02-18 NOTE — Patient Instructions (Signed)
Cyst removal 03/25/22. Do not take Plavix from 03/20/2022. Take tylenol 1000 mg before coming to get it removed.   Epidermoid Cyst Removal Epidermoid cyst removal is a procedure to remove a fluid-filled sac that forms under your skin (epidermoid cyst). This type of cyst is filled with a thick, oily substance (keratin) that is secreted by your skin glands. Epidermoid cysts may also be called epidermal cysts, or keratin cysts. Normally, the skin secretes this pasty material through a gland or a hair follicle. However, when a skin gland or hair follicle becomes blocked, an epidermoid cyst can form. You may need this procedure if you have an epidermal cyst that becomes large, uncomfortable, or inflamed. Tell a health care provider about: Any allergies you have. All medicines you are taking, including vitamins, herbs, eye drops, creams, and over-the-counter medicines. Any problems you or family members have had with anesthetic medicines. Any blood disorders you have. Any surgeries you have had. Any medical conditions you have now or have had. Whether you are pregnant or may be pregnant. What are the risks? Generally, this is a safe procedure. However, problems may occur, including: Recurrence of the cyst. Bleeding. Infection. Scarring. What happens before the procedure? Ask your health care provider about: Changing or stopping your regular medicines. This is especially important if you are taking diabetes medicines or blood thinners. Taking medicines such as aspirin and ibuprofen. These medicines can thin your blood. Do not take these medicines unless your health care provider tells you to take them. Taking over-the-counter medicines, vitamins, herbs, and supplements. If you have an inflamed or infected cyst, you may have to take antibiotic medicine before the cyst removal. Take your antibiotic as told by your health care provider. Do not stop taking the antibiotic even if you start to feel  better. Take a shower on the morning of your procedure. Your health care provider may ask you to use a germ-killing soap. What happens during the procedure?  You will be given a medicine to numb the area (local anesthetic). The skin around the cyst will be cleaned with a germ-killing solution. The health care provider will make a small incision in your skin over the cyst. The health care provider will separate the cyst from the surrounding tissues that are under your skin. If possible, the cyst will be removed undamaged (intact). If the cyst bursts (ruptures), it will be removed in pieces. After the cyst is removed, the health care provider will control any bleeding and close the incision with small stitches (sutures). Small incisions may not need sutures, and the bleeding will be controlled by applying direct pressure with gauze. The health care provider may apply antibiotic ointment and a bandage (dressing) over the incision. The procedure may vary among health care providers and hospitals. What happens after the procedure? If you are prescribed an antibiotic medicine or ointment, take or apply it as told by your health care provider. Do not stop using the antibiotic even if you start to feel better. Summary Epidermoid cyst removal is a procedure to remove a sac that has formed under your skin. You may need this procedure if you have an epidermoid cyst that becomes large, uncomfortable, or inflamed. The health care provider will make a small incision in your skin to remove the cyst. If you are prescribed an antibiotic medicine before the procedure, after the procedure, or both, use the antibiotic as told by your health care provider. Do not stop using the antibiotic even if you start  to feel better. This information is not intended to replace advice given to you by your health care provider. Make sure you discuss any questions you have with your health care provider. Document Revised:  08/01/2019 Document Reviewed: 08/01/2019 Elsevier Patient Education  [place].

## 2022-03-22 ENCOUNTER — Telehealth: Payer: Self-pay | Admitting: *Deleted

## 2022-03-22 ENCOUNTER — Other Ambulatory Visit: Payer: Self-pay | Admitting: Family Medicine

## 2022-03-22 ENCOUNTER — Encounter: Payer: Self-pay | Admitting: Family Medicine

## 2022-03-22 ENCOUNTER — Ambulatory Visit (INDEPENDENT_AMBULATORY_CARE_PROVIDER_SITE_OTHER): Payer: PPO | Admitting: Family Medicine

## 2022-03-22 VITALS — BP 166/81 | HR 67 | Temp 98.4°F | Ht 64.0 in | Wt 243.0 lb

## 2022-03-22 DIAGNOSIS — E785 Hyperlipidemia, unspecified: Secondary | ICD-10-CM | POA: Diagnosis not present

## 2022-03-22 DIAGNOSIS — M109 Gout, unspecified: Secondary | ICD-10-CM

## 2022-03-22 DIAGNOSIS — I152 Hypertension secondary to endocrine disorders: Secondary | ICD-10-CM

## 2022-03-22 DIAGNOSIS — E039 Hypothyroidism, unspecified: Secondary | ICD-10-CM

## 2022-03-22 DIAGNOSIS — E1159 Type 2 diabetes mellitus with other circulatory complications: Secondary | ICD-10-CM

## 2022-03-22 DIAGNOSIS — E1169 Type 2 diabetes mellitus with other specified complication: Secondary | ICD-10-CM | POA: Diagnosis not present

## 2022-03-22 LAB — BAYER DCA HB A1C WAIVED: HB A1C (BAYER DCA - WAIVED): 7.6 % — ABNORMAL HIGH (ref 4.8–5.6)

## 2022-03-22 MED ORDER — PREDNISONE 20 MG PO TABS: ORAL_TABLET | ORAL | 0 refills | Status: DC

## 2022-03-22 MED ORDER — LEVOTHYROXINE SODIUM 150 MCG PO TABS: 150.0000 ug | ORAL_TABLET | Freq: Every day | ORAL | 2 refills | Status: DC

## 2022-03-22 MED ORDER — SULFAMETHOXAZOLE-TRIMETHOPRIM 400-80 MG PO TABS: 1.0000 | ORAL_TABLET | Freq: Two times a day (BID) | ORAL | 0 refills | Status: AC

## 2022-03-22 NOTE — Telephone Encounter (Signed)
Call placed to patient. LMTRC.  

## 2022-03-22 NOTE — Progress Notes (Addendum)
BP (!) 166/81   Pulse 67   Temp 98.4 F (36.9 C)   Ht _0  (1.626 m)   Wt 243 lb (110.2 kg)   SpO2 97%   BMI 41.71 kg/m    Subjective:   Patient ID: Karen Potter, female    DOB: 07-25-1940, 81 y.o.   MRN: [id number]  HPI: Karen Potter is a 81 y.o. female presenting on 03/22/2022 for Medical Management of Chronic Issues, Hypertension, and Diabetes   HPI Type 2 diabetes mellitus Patient comes in today for recheck of his diabetes. Patient has been currently taking no medicine currently, has been diet controlled. Patient is currently on an ACE inhibitor/ARB. Patient has seen an ophthalmologist this year. Patient denies any new issues with their feet except for the gout. The symptom started onset as an adult hypertension and hyperlipidemia and hypothyroidism ARE RELATED TO DM   Hypothyroidism recheck Patient is coming in for thyroid recheck today as well. They deny any issues with hair changes or heat or cold problems or diarrhea or constipation. They deny any chest pain or palpitations. They are currently on levothyroxine 150 micrograms   Hyperlipidemia Patient is coming in for recheck of his hyperlipidemia. The patient is currently taking fish oils, has been intolerant of statins. They deny any issues with myalgias or history of liver damage from it. They deny any focal numbness or weakness or chest pain.   Hypertension Patient is currently on losartan, and their blood pressure today is 166/81. Patient denies any lightheadedness or dizziness. Patient denies headaches, blurred vision, chest pains, shortness of breath, or weakness. Denies any side effects from medication and is content with current medication.   Patient is having right ankle pain has been going on over the past 5 days again.  She is still taking allopurinol for prevention but its been bothering her significantly.  It did not swell up and read as bad as it has in the past but she still having a lot of pain.  She  has pain even from the bed sheets when she is sleeping.  Relevant past medical, surgical, family and social history reviewed and updated as indicated. Interim medical history since our last visit reviewed. Allergies and medications reviewed and updated.  Review of Systems  Constitutional:  Negative for chills and fever.  HENT:  Negative for congestion, ear discharge and ear pain.   Eyes:  Negative for redness and visual disturbance.  Respiratory:  Negative for chest tightness and shortness of breath.   Cardiovascular:  Negative for chest pain and leg swelling.  Genitourinary:  Negative for difficulty urinating and dysuria.  Musculoskeletal:  Positive for arthralgias and gait problem. Negative for back pain.  Skin:  Negative for rash.  Neurological:  Negative for light-headedness and headaches.  Psychiatric/Behavioral:  Negative for agitation and behavioral problems.   All other systems reviewed and are negative.   Per HPI unless specifically indicated above   Allergies as of 03/22/2022       Reactions   Codeine Itching   Dilaudid [hydromorphone] Other (See Comments)   Mouth blisters   Other Itching   Most pain meds cause itching.  When has to take pain medication, she has been instructed to take Benadryl   Hydrocodone-acetaminophen Other (See Comments)   Doxycycline Rash, Other (See Comments)        Medication List        Accurate as of March 22, 2022 11:55 AM. If you have any questions, ask  your nurse or doctor.          acetaminophen 650 MG CR tablet Commonly known as: TYLENOL Take 650 mg by mouth in the morning.   albuterol 108 (90 Base) MCG/ACT inhaler Commonly known as: VENTOLIN HFA Inhale 2 puffs into the lungs every 6 (six) hours as needed for wheezing or shortness of breath.   allopurinol 100 MG tablet Commonly known as: ZYLOPRIM Take 1 tablet (100 mg total) by mouth daily.   Blood Glucose Monitor System w/Device Kit Use glucose meter to check  blood sugar up to 3 times daily as instructed. One Touch Verio preferred by insurance. 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   carvedilol 6.25 MG tablet Commonly known as: COREG Take 1 tablet (6.25 mg total) by mouth 2 (two) times daily with a meal.   clopidogrel 75 MG tablet Commonly known as: PLAVIX Take 1 tablet (75 mg total) by mouth every evening.   clotrimazole-betamethasone cream Commonly known as: LOTRISONE Apply 1 Application topically daily.   Elderberry 500 MG Caps Take 500 mg by mouth daily.   famotidine 10 MG tablet Commonly known as: PEPCID Take 10 mg by mouth 2 (two) times daily.   Fish Oil Triple Strength 1400 MG Caps Take 1,000 mg by mouth 4 (four) times daily.   furosemide 40 MG tablet Commonly known as: LASIX Take 1 tablet by mouth once daily   L-FORMULA LYSINE HCL PO Take 1 Dose by mouth daily.   Lancets Misc. Kit Use patient & insurance preferred lancet device to test blood glucose up to 3 times daily for Dx: E11.69   levothyroxine 150 MCG tablet Commonly known as: Euthyrox Take 1 tablet (150 mcg total) by mouth daily.   losartan 25 MG tablet Commonly known as: COZAAR Take 1 tablet (25 mg total) by mouth daily.   nystatin powder Commonly known as: MYCOSTATIN/NYSTOP Apply 1 Application topically 3 (three) times daily.   OneTouch Verio test strip Generic drug: glucose blood USE 1 STRIP TO CHECK GLUCOSE UP TO THREE TIMES DAILY AS  INSTRUCTED   pantoprazole 40 MG tablet Commonly known as: Protonix Take 1 tablet (40 mg total) by mouth daily.   predniSONE 20 MG tablet Commonly known as: DELTASONE 2 po at same time daily for 5 days Started by: Worthy Rancher, MD   Vitamin D3 50 MCG (2000 UT) capsule Take 2,000 Units by mouth 2 (two) times daily.         Objective:   BP (!) 166/81   Pulse 67   Temp 98.4 F (36.9 C)   Ht _0  (1.626 m)   Wt  243 lb (110.2 kg)   SpO2 97%   BMI 41.71 kg/m   Wt Readings from Last 3 Encounters:  03/22/22 243 lb (110.2 kg)  02/18/22 244 lb (110.7 kg)  01/22/22 241 lb (109.3 kg)    Physical Exam Vitals and nursing note reviewed.  Constitutional:      General: She is not in acute distress.    Appearance: She is well-developed. She is not diaphoretic.  Eyes:     Conjunctiva/sclera: Conjunctivae normal.  Cardiovascular:     Rate and Rhythm: Normal rate and regular rhythm.     Heart sounds: Normal heart sounds. No murmur heard. Pulmonary:     Effort: Pulmonary effort is normal. No respiratory distress.     Breath sounds: Normal breath sounds. No wheezing.  Musculoskeletal:  General: Normal range of motion.     Left ankle: No swelling or deformity. Tenderness present over the medial malleolus.       Legs:  Skin:    General: Skin is warm and dry.     Findings: No rash.  Neurological:     Mental Status: She is alert and oriented to person, place, and time.     Coordination: Coordination normal.  Psychiatric:        Behavior: Behavior normal.       Assessment & Plan:   Problem List Items Addressed This Visit       Cardiovascular and Mediastinum   Hypertension associated with diabetes ([place])     Endocrine   Hyperlipidemia associated with type 2 diabetes mellitus ([place])   Hypothyroid   Relevant Medications   levothyroxine (EUTHYROX) 150 MCG tablet   Other Relevant Orders   TSH   Bayer DCA Hb A1c Waived   Type 2 diabetes mellitus with other specified complication (HCC) - Primary   Relevant Orders   TSH   Bayer DCA Hb A1c Waived   Other Visit Diagnoses     Acute gout of right ankle, unspecified cause       Relevant Medications   predniSONE (DELTASONE) 20 MG tablet     A1c slightly elevated at 7.6, will focus on diet but likely from being more sedentary.  If she cannot get down by next time then we will have to start medicine for it.  BP elevated but is in pain from  her gout, likely elevated for this, she will keep close eye on it at home for the next 2 weeks.  Follow up plan: Return in about 6 months (around 09/20/2022), or if symptoms worsen or fail to improve, for Thyroid and diabetes recheck..  Counseling provided for all of the vaccine components Orders Placed This Encounter  Procedures   TSH   Bayer [place] Hb A1c [place] Rosy Estabrook, MD [facility] 03/22/2022, 11:55 AM

## 2022-03-22 NOTE — Progress Notes (Signed)
Placed order for microalbumin

## 2022-03-22 NOTE — Telephone Encounter (Signed)
Patient returned call and made aware.   Prescription sent to pharmacy.

## 2022-03-22 NOTE — Telephone Encounter (Signed)
Surgical Date: 03/25/2022 Procedure: Excision Epidermoid Cyst, Back  Received call from patient [phone]~ telephone.   Reports that she noted irritation from bra on 03/20/2022 that led to area bleeding. States that spouse looked at area and felt there was slight purulent drainage.   Reports that Dr. Constance Haw advised patient would need ABTx prior to procedure if purulent drainage noted.   Allergies: Doxycycline, Codeine, Dilaudid, Hydrocodone, IV Dye Pharmacy: [facility]

## 2022-03-23 LAB — MICROALBUMIN / CREATININE URINE RATIO
Creatinine, Urine: 178.7 mg/dL
Microalb/Creat Ratio: 15 mg/g creat (ref 0–29)
Microalbumin, Urine: 26.4 ug/mL

## 2022-03-23 LAB — TSH: TSH: 0.384 u[IU]/mL — ABNORMAL LOW (ref 0.450–4.500)

## 2022-03-25 ENCOUNTER — Encounter: Payer: Self-pay | Admitting: General Surgery

## 2022-03-25 ENCOUNTER — Ambulatory Visit (INDEPENDENT_AMBULATORY_CARE_PROVIDER_SITE_OTHER): Payer: PPO | Admitting: General Surgery

## 2022-03-25 VITALS — BP 135/71 | HR 58 | Temp 98.1°F | Resp 16 | Ht 64.0 in | Wt 245.0 lb

## 2022-03-25 DIAGNOSIS — L723 Sebaceous cyst: Secondary | ICD-10-CM

## 2022-03-25 DIAGNOSIS — L720 Epidermal cyst: Secondary | ICD-10-CM | POA: Diagnosis not present

## 2022-03-25 NOTE — Progress Notes (Signed)
[facility] Procedure Note  03/25/22  Pre-procedure Diagnosis:  Cyst on back   Post-procedure Diagnosis: Same   Procedure(s) Performed: Excision of cyst on back 2cm    Surgeon: Lanell Matar. Constance Haw, MD   Assistants: No qualified resident was available    Anesthesia: Lidocaine 1%    Specimens: Cyst    Estimated Blood Loss: Minimal  Wound Class: Clean contaminated    Procedure Indications: Karen Potter is an 81 yo with a cyst on her back that has been draining and has been infected in the last few months. We discussed a risk of bleeding, infection, need for packing the wound.   Findings: 2cm cyst with disruption    Procedure: The patient was taken to the procedure room and placed with her left side down. The back was prepared and draped in the usual sterile fashion. Lidocaine 1% was injected. An elliptical incision was made around the central pit and carried down to the subcutaneous tissue. The cyst was removed in its entirety with minimal to no spillage of cyst contents. The cyst was 2cm in size.  The cavity was made hemostatic. The skin was closed with 3-0 Nylon sutures in the interrupted fashion. A bandaid with neosporin was placed.   Final inspection revealed acceptable hemostasis. The patient tolerated the procedure well.    Karen Labrum, MD [facility] [address] [place], [place] [address] [phone] (office)

## 2022-03-25 NOTE — Patient Instructions (Signed)
Remove bandage in 48 hours unless saturated. Then it is ok to replace. Replace with neosporin and bandaid daily. Ok to shower after 48 hours and get sutures wet. Just pat dry before replacing bandaid.   Tylenol and ibuprofen for pain control.  Monitor for any signs of infection, redness or drainage from the wound.  Finish your antibiotics. Start Plavix tomorrow unless the bandaid is very bloody/saturated.

## 2022-03-30 ENCOUNTER — Other Ambulatory Visit: Payer: Self-pay | Admitting: Family Medicine

## 2022-03-30 ENCOUNTER — Other Ambulatory Visit: Payer: Self-pay

## 2022-03-30 LAB — PATHOLOGY REPORT

## 2022-03-30 LAB — TISSUE SPECIMEN

## 2022-03-30 MED ORDER — LEVOTHYROXINE SODIUM 137 MCG PO CAPS: 1.0000 | ORAL_CAPSULE | Freq: Every day | ORAL | 1 refills | Status: DC

## 2022-03-30 NOTE — Progress Notes (Signed)
Karen Kaufmann Dettinger, MD 03/25/2022  8:54 AM EST     Patient's thyroid level shows about the same when it is just slightly high but only slightly.  Please send her a new prescription of levothyroxine 138 mcg daily and give her 6 months worth.  We can recheck at her next visit in 2 to 3 months.    Per result note above sent in RX for 137 mcg to [facility]

## 2022-03-31 NOTE — Telephone Encounter (Signed)
Sent tablets

## 2022-03-31 NOTE — Progress Notes (Signed)
Pathology back with normal cyst.

## 2022-04-08 ENCOUNTER — Ambulatory Visit (INDEPENDENT_AMBULATORY_CARE_PROVIDER_SITE_OTHER): Payer: PPO | Admitting: General Surgery

## 2022-04-08 ENCOUNTER — Encounter: Payer: Self-pay | Admitting: General Surgery

## 2022-04-08 VITALS — BP 129/74 | HR 71 | Temp 97.9°F | Resp 14 | Ht 64.0 in | Wt 244.0 lb

## 2022-04-08 DIAGNOSIS — L723 Sebaceous cyst: Secondary | ICD-10-CM

## 2022-04-08 NOTE — Patient Instructions (Signed)
Keep area covered with neosporin for next 5 days.

## 2022-04-08 NOTE — Progress Notes (Signed)
[facility]  No issues.   BP 129/74   Pulse 71   Temp 97.9 F (36.6 C) (Oral)   Resp 14   Ht '5\' 4"'$  (1.626 m)   Wt 244 lb (110.7 kg)   SpO2 94%   BMI 41.88 kg/m  Sutures removed. No drainage. Overall healing. Covered with bandaid.  Patient s/p excision of cyst. Doing well. Pathology benign.  PRN follow up. Keep area covered with neosporin for next 5 days.   Curlene Labrum, MD [facility] [address] [place], [place] [address] [phone] (office)

## 2022-04-21 ENCOUNTER — Ambulatory Visit: Payer: PPO | Admitting: Family Medicine

## 2022-05-20 ENCOUNTER — Telehealth: Payer: Self-pay | Admitting: Family Medicine

## 2022-05-20 DIAGNOSIS — E1169 Type 2 diabetes mellitus with other specified complication: Secondary | ICD-10-CM

## 2022-05-20 DIAGNOSIS — I152 Hypertension secondary to endocrine disorders: Secondary | ICD-10-CM

## 2022-05-20 DIAGNOSIS — E1159 Type 2 diabetes mellitus with other circulatory complications: Secondary | ICD-10-CM

## 2022-05-20 DIAGNOSIS — E039 Hypothyroidism, unspecified: Secondary | ICD-10-CM

## 2022-05-20 NOTE — Telephone Encounter (Signed)
Seen on 11/13. A1C 7.6 then.

## 2022-05-20 NOTE — Telephone Encounter (Signed)
Yes based on those numbers it does sound like she does need to be on diabetes medicines.  Please start her on metformin 500 mg twice daily and send her 6 months worth.  She should come back in about a month and we can recheck her blood sugars

## 2022-05-20 NOTE — Telephone Encounter (Signed)
Pt states that she was on Metformin a long time ago and that "it did not work out" and was started on something else. Pt does not remember the side effect of Metformin and does not remember the name of the medication she was put on instead.  Looks like she has tried [place] and Glimepiride

## 2022-05-21 MED ORDER — SITAGLIPTIN PHOSPHATE 100 MG PO TABS: 100.0000 mg | ORAL_TABLET | Freq: Every day | ORAL | 1 refills | Status: DC

## 2022-05-21 NOTE — Telephone Encounter (Signed)
I sent Januvia in for her, she may have to apply for prescription assistance depending on how much the expense comes out for her.  Call the pharmacy and see about the expense

## 2022-05-21 NOTE — Telephone Encounter (Signed)
Pt informed. Med cost $90. Pt is not sure if it is a 90d supply or not. She states that she can pay for it one time but will need assistance.  CCM referral placed

## 2022-05-23 ENCOUNTER — Other Ambulatory Visit: Payer: Self-pay | Admitting: Family Medicine

## 2022-05-26 ENCOUNTER — Telehealth: Payer: Self-pay | Admitting: Pharmacist

## 2022-05-26 DIAGNOSIS — E1169 Type 2 diabetes mellitus with other specified complication: Secondary | ICD-10-CM

## 2022-05-26 NOTE — Telephone Encounter (Signed)
Please mail paperwork for januvia '100mg'$  daily Dr. Warrick Parisian  Thank you!

## 2022-05-31 NOTE — Telephone Encounter (Signed)
Mailing to pt's home

## 2022-06-07 ENCOUNTER — Telehealth: Payer: Self-pay | Admitting: Family Medicine

## 2022-06-07 NOTE — Telephone Encounter (Signed)
Patient stopped by to drop off papers for [place]. Would like for her to call her.

## 2022-06-07 NOTE — Telephone Encounter (Signed)
Patient got ppw on Januvia and wants to know if she needs to bring to [place]. Patient coming by the office.

## 2022-06-08 NOTE — Telephone Encounter (Signed)
Returned call Discussed B[facility] and mailed merck PAP

## 2022-06-16 ENCOUNTER — Ambulatory Visit: Payer: PPO | Admitting: Adult Health

## 2022-06-16 ENCOUNTER — Encounter: Payer: Self-pay | Admitting: Adult Health

## 2022-06-16 VITALS — BP 144/87 | Ht 64.0 in | Wt 239.6 lb

## 2022-06-16 DIAGNOSIS — G4733 Obstructive sleep apnea (adult) (pediatric): Secondary | ICD-10-CM

## 2022-06-16 NOTE — Progress Notes (Signed)
PATIENT: Karen Potter DOB: 08/21/1940  REASON FOR VISIT: follow up HISTORY FROM: patient PRIMARY NEUROLOGIST: Dr. Rexene Alberts  Chief Complaint  Patient presents with   Follow-up    Pt in 12 Pt here CPAP f/u  Pt states increased fatigue      HISTORY OF PRESENT ILLNESS: Today 06/16/22:  [name] is a 82 y.o. female with a history of OSA on CPAP. Returns today for follow-up. Reports that she feels more sleepy. PCP changed her to Januvia and told her that could be causing her to be sleepy. Hard time getting her glucose <180 despite changes in medication.  She plans to continue to discuss with her PCP.  She states that she has no trouble falling asleep at night and staying asleep.  Her download is below         REVIEW OF SYSTEMS: Out of a complete 14 system review of symptoms, the patient complains only of the following symptoms, and all other reviewed systems are negative.   ESS 14  ALLERGIES: Allergies  Allergen Reactions   Codeine Itching   Dilaudid [Hydromorphone] Other (See Comments)    Mouth blisters   Other Itching    Most pain meds cause itching.  When has to take pain medication, she has been instructed to take Benadryl   Hydrocodone-Acetaminophen Other (See Comments)   Doxycycline Rash and Other (See Comments)    HOME MEDICATIONS: Outpatient Medications Prior to Visit  Medication Sig Dispense Refill   acetaminophen (TYLENOL) 650 MG CR tablet Take 650 mg by mouth in the morning.     allopurinol (ZYLOPRIM) 100 MG tablet Take 1 tablet (100 mg total) by mouth daily. 90 tablet 3   Blood Glucose Monitoring Suppl (BLOOD GLUCOSE MONITOR SYSTEM) w/Device KIT Use glucose meter to check blood sugar up to 3 times daily as instructed. One Touch Verio preferred by insurance. Dx: EE11.69 1 each 0   carvedilol (COREG) 6.25 MG tablet Take 1 tablet (6.25 mg total) by mouth 2 (two) times daily with a meal. 180 tablet 3   Cholecalciferol (VITAMIN D3) 2000 UNITS capsule  Take 2,000 Units by mouth 2 (two) times daily.      clopidogrel (PLAVIX) 75 MG tablet Take 1 tablet (75 mg total) by mouth every evening. 90 tablet 3   clotrimazole-betamethasone (LOTRISONE) cream Apply 1 Application topically daily. 90 g 2   Elderberry 500 MG CAPS Take 500 mg by mouth daily.     furosemide (LASIX) 40 MG tablet Take 1 tablet by mouth once daily 90 tablet 0   L-FORMULA LYSINE HCL PO Take 1 Dose by mouth daily.     Lancets Misc. KIT Use patient & insurance preferred lancet device to test blood glucose up to 3 times daily for Dx: E11.69 100 each 12   levothyroxine (SYNTHROID) 137 MCG tablet Take 1 tablet (137 mcg total) by mouth daily before breakfast. 90 tablet 1   losartan (COZAAR) 25 MG tablet Take 1 tablet (25 mg total) by mouth daily. 90 tablet 3   nystatin (MYCOSTATIN/NYSTOP) powder Apply 1 Application topically 3 (three) times daily. 60 g 1   Omega-3 Fatty Acids (FISH OIL TRIPLE STRENGTH) 1400 MG CAPS Take 1,000 mg by mouth 4 (four) times daily.      ONETOUCH VERIO test strip USE 1 STRIP TO CHECK GLUCOSE UP TO THREE TIMES DAILY AS  INSTRUCTED 100 each 9   pantoprazole (PROTONIX) 40 MG tablet Take 1 tablet (40 mg total) by mouth daily. [place]  tablet 3   sitaGLIPtin (JANUVIA) 100 MG tablet Take 1 tablet (100 mg total) by mouth daily. 90 tablet 1   albuterol (VENTOLIN HFA) 108 (90 Base) MCG/ACT inhaler Inhale 2 puffs into the lungs every 6 (six) hours as needed for wheezing or shortness of breath. (Patient not taking: Reported on 06/16/2022) 8 g 5   brompheniramine-pseudoephedrine-DM 30-2-10 MG/5ML syrup Take 5 mLs by mouth 4 (four) times daily as needed. (Patient not taking: Reported on 06/16/2022) 120 mL 0   famotidine (PEPCID) 10 MG tablet Take 10 mg by mouth 2 (two) times daily.     No facility-administered medications prior to visit.    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   age 43--  pt states was quarantined   no treatment ;  per pt no symptoms or issues since   History of kidney stones    History of TIA (transient ischemic attack)    03-23-2014  w/ episode temporay amnesia   Hyperlipidemia    Hypertension    Hypothyroidism    Iron deficiency anemia    LAFB (left anterior fascicular block)    OA (osteoarthritis)    OSA on CPAP    severe per study 2003   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   Post-polio limb muscle weakness    left leg  w/ 3 repair surgery's   RBBB (right bundle branch block)    Seasonal allergies    TIA (transient ischemic attack)    2023   Type 2 diabetes mellitus ([place])    Vitamin D deficiency     PAST SURGICAL HISTORY: Past Surgical History:  Procedure Laterality Date   BACK SURGERY     CARPAL TUNNEL RELEASE Right 05/10/2007   CHOLECYSTECTOMY OPEN  05/11/1983   and Appendectomy   CYSTO/  RIGHT URETEROSCOPIC STONE EXTRACTON/  STENT PLACEMENT  06/ 2016   in [place]   CYSTOSCOPY/RETROGRADE/URETEROSCOPY/STONE EXTRACTION WITH BASKET Right 11/17/2015   Procedure: CYSTOSCOPY/RETROGRADE PYELOGRAM RIGHT/DIAGNOSTIC RIGHT URETEROSCOPY/LASER RENAL CALCIFICATION/RIGHT STENT PLACEMENT;  Surgeon: Cleon Gustin, MD;  Location[facility];  Service: Urology;  Laterality: Right;   ESOPHAGOGASTRODUODENOSCOPY N/A 08/29/2015   Procedure: ESOPHAGOGASTRODUODENOSCOPY (EGD);  Surgeon: Danie Binder, MD;  Location: [facility];  Service: Endoscopy;  Laterality: N/A;   HOLMIUM LASER APPLICATION Right 72/01/4708   Procedure: HOLMIUM LASER APPLICATION;  Surgeon: Cleon Gustin, MD;   Location: [facility];  Service: Urology;  Laterality: Right;   KNEE ARTHROSCOPY Right 05/10/1997   LEFT HEEL REPAIR SURGERY  1954;  1985;  [place] [facility]  09/01/2010   L4 -L5 fusion,  L3 laminectomy,  L5 - S1 foraminotomy   OVARIAN CYST SURGERY Right 05/10/1964   PARATHYROIDECTOMY N/A 05/06/2015   Procedure: PARATHYROIDECTOMY;  Surgeon: Armandina Gemma, MD;  Location: [facility];  Service: General;  Laterality: N/A;   left superior   TONSILLECTOMY  as child  TOTAL ABDOMINAL HYSTERECTOMY W/ BILATERAL SALPINGOOPHORECTOMY  05/10/1982   TOTAL KNEE ARTHROPLASTY Right 04/08/2008   TRANSTHORACIC ECHOCARDIOGRAM  04/07/2012   grade 1 diastolic function,  ef 11-94%/  mild AV calcification without stenosis/  trivial MR    FAMILY HISTORY: Family History  Problem Relation Age of Onset   Heart failure Mother        No details.  No MI   Cancer Father        Lung   Cancer Brother        Lung   Stroke Maternal Grandfather    Cancer Paternal Grandmother        rectal   Cancer Paternal Grandfather        gastric   Sleep apnea Neg Hx     SOCIAL HISTORY: Social History   Socioeconomic History   Marital status: Married    Spouse name: Mickey   Number of children: 4   Years of education: 13-some college   Highest education level: High school graduate  Occupational History   Occupation: Retired  Tobacco Use   Smoking status: Never   Smokeless tobacco: Never  Scientific laboratory technician Use: Never used  Substance and Sexual Activity   Alcohol use: No   Drug use: No   Sexual activity: Not Currently    Birth control/protection: Surgical  Other Topics Concern   Not on file  Social History Narrative   Lives with husband.  Four children.     Drinks 1-2 cokes per day at least, but usually drinks water   All children live fairly close. Son is closest, but works 12-13 hours/day.   Disabled daughter in [place] that she helps care for.   Another daughter lives within 15  minutes and takes her shopping and to out of town doctor visits.   Social Determinants of Health   Financial Resource Strain: Low Risk  (12/04/2021)   Overall Financial Resource Strain (CARDIA)    Difficulty of Paying Living Expenses: Not hard at all  Food Insecurity: No Food Insecurity (12/04/2021)   Hunger Vital Sign    Worried About Running Out of Food in the Last Year: Never true    Ran Out of Food in the Last Year: Never true  Transportation Needs: No Transportation Needs (12/04/2021)   PRAPARE - Hydrologist (Medical): No    Lack of Transportation (Non-Medical): No  Physical Activity: Insufficiently Active (12/04/2021)   Exercise Vital Sign    Days of Exercise per Week: 7 days    Minutes of Exercise per Session: 10 min  Stress: No Stress Concern Present (12/04/2021)   [place]    Feeling of Stress : Only a little  Social Connections: Socially Integrated (12/04/2021)   Social Connection and Isolation Panel [NHANES]    Frequency of Communication with Friends and Family: More than three times a week    Frequency of Social Gatherings with Friends and Family: More than three times a week    Attends Religious Services: More than 4 times per year    Active Member of [employer] or Organizations: Yes    Attends Archivist Meetings: More than 4 times per year    Marital Status: Married  Human resources officer Violence: Not At Risk (12/04/2021)   Humiliation, Afraid, Rape, and Kick questionnaire    Fear of Current or Ex-Partner: No    Emotionally Abused: No    Physically Abused: No  Sexually Abused: No      PHYSICAL EXAM  Vitals:   06/16/22 1427  BP: (!) 144/87  Weight: 239 lb 9.6 oz (108.7 kg)  Height: '5\' 4"'$  (1.626 m)   Body mass index is 41.13 kg/m.  Generalized: Well developed, in no acute distress  Chest: Lungs clear to auscultation bilaterally  Neurological examination   Mentation: Alert oriented to time, place, history taking. Follows all commands speech and language fluent Cranial nerve II-XII: Extraocular movements were full, visual field were full on confrontational test Head turning and shoulder shrug  were normal and symmetric. Gait and station: Gait is normal.    DIAGNOSTIC DATA (LABS, IMAGING, TESTING) - I reviewed patient records, labs, notes, testing and imaging myself where available.  Lab Results  Component Value Date   WBC 7.2 12/09/2021   HGB 13.0 12/09/2021   HCT 38.9 12/09/2021   MCV 90 12/09/2021   PLT 254 12/09/2021      Component Value Date/Time   NA 140 12/09/2021 1103   K 4.0 12/09/2021 1103   CL 101 12/09/2021 1103   CO2 25 12/09/2021 1103   GLUCOSE 190 (H) 12/09/2021 1103   GLUCOSE 185 (H) 06/14/2021 1346   BUN 14 12/09/2021 1103   CREATININE 0.91 12/09/2021 1103   CREATININE 0.73 10/03/2012 1310   CALCIUM 9.2 12/09/2021 1103   PROT 6.9 12/09/2021 1103   ALBUMIN 3.8 12/09/2021 1103   AST 21 12/09/2021 1103   ALT 21 12/09/2021 1103   ALKPHOS 97 12/09/2021 1103   BILITOT 0.3 12/09/2021 1103   GFRNONAA >60 06/14/2021 1346   GFRNONAA 84 10/03/2012 1310   GFRAA 62 03/24/2020 0946   GFRAA >89 10/03/2012 1310   Lab Results  Component Value Date   CHOL 138 12/09/2021   HDL 33 (L) 12/09/2021   LDLCALC 66 12/09/2021   TRIG 243 (H) 12/09/2021   CHOLHDL 4.2 12/09/2021   Lab Results  Component Value Date   HGBA1C 7.6 (H) 03/22/2022   Lab Results  Component Value Date   VITAMINB12 262 08/28/2015   Lab Results  Component Value Date   TSH 0.384 (L) 03/22/2022      ASSESSMENT AND PLAN 82 y.o. year old female  has a past medical history of Duodenal ulcer without hemorrhage or perforation, Fatty liver, Gastric ulcer without hemorrhage or perforation, Gastritis, GERD (gastroesophageal reflux disease), Hearing loss, History of benign parathyroid tumor, History of hepatitis B (?B), History of kidney stones, History of  TIA (transient ischemic attack), Hyperlipidemia, Hypertension, Hypothyroidism, Iron deficiency anemia, LAFB (left anterior fascicular block), OA (osteoarthritis), OSA on CPAP, Polio, Post-polio limb muscle weakness, RBBB (right bundle branch block), Seasonal allergies, TIA (transient ischemic attack), Type 2 diabetes mellitus ([place]), and Vitamin D deficiency. here with:  OSA on CPAP  - CPAP compliance excellent - Good treatment of AHI  - Encourage patient to use CPAP nightly and > 4 hours each night - F/U in 1 year or sooner if needed    Ward Givens, MSN, NP-C 06/16/2022, 2:48 PM [facility] [address], [place], [place] [address] [phone]

## 2022-06-22 ENCOUNTER — Ambulatory Visit (INDEPENDENT_AMBULATORY_CARE_PROVIDER_SITE_OTHER): Payer: PPO | Admitting: Pharmacist

## 2022-06-22 DIAGNOSIS — E1169 Type 2 diabetes mellitus with other specified complication: Secondary | ICD-10-CM

## 2022-06-22 MED ORDER — METFORMIN HCL ER 500 MG PO TB24: 500.0000 mg | ORAL_TABLET | Freq: Every day | ORAL | 1 refills | Status: DC

## 2022-06-22 NOTE — Progress Notes (Signed)
    S:    T2DM  82 y.o. female who presents for diabetes evaluation, education, and management.  PMH is significant for T2DM, HTN, HLD, OSA, GERD, and history of TIA.  Patient was last seen by Primary Care Provider, Dr. Warrick Parisian, on 03/22/2022.   Today, patient is in good spirits. Patient states she was able to pick up a 90 DS of Januvia on 05/22/2022 and still has ~2 months worth of medicine. She has not yet heard from Januvia PAP.  States she used to be on metformin and glimepiride and she is willing to retry medications as she feels the Januvia is not effective.   Patient reports Diabetes was diagnosed 'a while ago'. She states she has been off medications for 2-3 years.   Current diabetes medications include: Januvia 100 mg daily Previous diabetes medications: glimepiride (DC'd due to symptomatic hypoglycemia), metformin IR (DC'd due to diarrhea)   Patient reports adherence to taking all medications as prescribed.   Patient denies hypoglycemic events. Reported home fasting blood sugars: 180-215   Reported 2 hour post-meal/random blood sugars: 250-260s  Patient denies nocturia (nighttime urination).  Patient reports neuropathy (nerve pain). Patient denies visual changes.  Patient reported dietary habits: Eats 2 meals/day (breakfast and dinner) Breakfast: bacon, eggs, tomatoes, grits Dinner: tortellini soup last night, typically meat and veggies Beverage: Coke (2 day) >> reports BG is high even when she drinks only water  O:  Lab Results  Component Value Date   HGBA1C 7.6 (H) 03/22/2022   Lipid Panel     Component Value Date/Time   CHOL 138 12/09/2021 1103   CHOL 129 10/03/2012 1310   TRIG 243 (H) 12/09/2021 1103   TRIG 181 (H) 04/10/2013 1206   TRIG 264 (H) 10/03/2012 1310   HDL 33 (L) 12/09/2021 1103   HDL 39 (L) 04/10/2013 1206   HDL 29 (L) 10/03/2012 1310   CHOLHDL 4.2 12/09/2021 1103   CHOLHDL 4.7 06/15/2021 0523   VLDL 48 (H) 06/15/2021 0523   LDLCALC 66  12/09/2021 1103   LDLCALC 37 04/10/2013 1206   LDLCALC 47 10/03/2012 1310    Clinical Atherosclerotic Cardiovascular Disease (ASCVD): Yes - h/o TIA The ASCVD Risk score (Arnett DK, et al., 2019) failed to calculate for the following reasons:   The 2019 ASCVD risk score is only valid for ages 45 to 71   A/P: Diabetes longstanding, currently close to goal based on A1c (7.6%). Patient is able to verbalize appropriate hypoglycemia management plan. Medication adherence appears appropriate. Patient has not heard from Januvia PAP. -Continue Januvia 100 mg daily.  -Restart metformin XR 500 mg daily x1 week then increase to 500 mg BID thereafter if tolerated. -Consider discussing LIS enrollment at follow-up.  -Patient educated on purpose, proper use, and potential adverse effects of Januvia, metformin.  -Extensively discussed pathophysiology of diabetes, recommended lifestyle interventions, dietary effects on blood sugar control.  -Counseled on s/sx of and management of hypoglycemia.  -Next A1c anticipated February 2024.   Total time counseling 30 minutes.    Follow-up:  Pharmacist PRN. PCP clinic visit tomorrow.   Joseph Art, Pharm.D. PGY-2 Ambulatory Care Pharmacy Resident 06/22/2022 10:39 AM  Regina Eck, PharmD, BCPS, BCACP Clinical Pharmacist, [place]  II  T [phone]

## 2022-06-22 NOTE — Addendum Note (Signed)
Addended by: Lottie Dawson D on: 06/22/2022 12:35 PM   Modules accepted: Orders

## 2022-06-22 NOTE — Telephone Encounter (Signed)
Can you f/u januvia [facility] ? Let patient know please  Thank you!

## 2022-06-23 ENCOUNTER — Encounter: Payer: Self-pay | Admitting: Family Medicine

## 2022-06-23 ENCOUNTER — Ambulatory Visit (INDEPENDENT_AMBULATORY_CARE_PROVIDER_SITE_OTHER): Payer: PPO | Admitting: Family Medicine

## 2022-06-23 ENCOUNTER — Ambulatory Visit (INDEPENDENT_AMBULATORY_CARE_PROVIDER_SITE_OTHER): Payer: PPO | Admitting: *Deleted

## 2022-06-23 VITALS — BP 130/74 | HR 68 | Ht 64.0 in | Wt 240.0 lb

## 2022-06-23 DIAGNOSIS — E1159 Type 2 diabetes mellitus with other circulatory complications: Secondary | ICD-10-CM

## 2022-06-23 DIAGNOSIS — E785 Hyperlipidemia, unspecified: Secondary | ICD-10-CM | POA: Diagnosis not present

## 2022-06-23 DIAGNOSIS — E1140 Type 2 diabetes mellitus with diabetic neuropathy, unspecified: Secondary | ICD-10-CM | POA: Insufficient documentation

## 2022-06-23 DIAGNOSIS — E039 Hypothyroidism, unspecified: Secondary | ICD-10-CM | POA: Diagnosis not present

## 2022-06-23 DIAGNOSIS — E1169 Type 2 diabetes mellitus with other specified complication: Secondary | ICD-10-CM

## 2022-06-23 DIAGNOSIS — N904 Leukoplakia of vulva: Secondary | ICD-10-CM

## 2022-06-23 DIAGNOSIS — Z23 Encounter for immunization: Secondary | ICD-10-CM

## 2022-06-23 DIAGNOSIS — I152 Hypertension secondary to endocrine disorders: Secondary | ICD-10-CM

## 2022-06-23 LAB — BAYER DCA HB A1C WAIVED: HB A1C (BAYER DCA - WAIVED): 9.5 % — ABNORMAL HIGH (ref 4.8–5.6)

## 2022-06-23 MED ORDER — EMPAGLIFLOZIN 25 MG PO TABS: 25.0000 mg | ORAL_TABLET | Freq: Every day | ORAL | 3 refills | Status: DC

## 2022-06-23 MED ORDER — ONETOUCH VERIO VI STRP: ORAL_STRIP | 9 refills | Status: DC

## 2022-06-23 MED ORDER — ROSUVASTATIN CALCIUM 5 MG PO TABS: 5.0000 mg | ORAL_TABLET | Freq: Every day | ORAL | 3 refills | Status: DC

## 2022-06-23 MED ORDER — CLOBETASOL PROPIONATE 0.05 % EX OINT: 1.0000 | TOPICAL_OINTMENT | Freq: Two times a day (BID) | CUTANEOUS | 3 refills | Status: DC

## 2022-06-23 MED ORDER — FUROSEMIDE 40 MG PO TABS: 40.0000 mg | ORAL_TABLET | Freq: Every day | ORAL | 3 refills | Status: DC

## 2022-06-23 NOTE — Plan of Care (Signed)
Chronic Care Management Provider Comprehensive Care Plan    06/23/2022 Name: Karen Potter MRN: [id number] DOB: 16-Apr-1941  Referral to Chronic Care Management (CCM) services was placed by Provider:  Caryl Pina MD on Date: 05/21/22.  Chronic Condition 1: HYPERTENSION Provider Assessment and Plan   Hypertension associated with diabetes ([place])  Patient is currently on losartan, and their blood pressure today is 166/81. Patient denies any lightheadedness or dizziness. Patient denies headaches, blurred vision, chest pains, shortness of breath, or weakness. Denies any side effects from medication and is content with current medication.     Expected Outcome/Goals Addressed This Visit (Provider CCM goals/Provider Assessment and plan  CCM (HYPERTENSION) EXPECTED OUTCOME: MONITOR, SELF-MANAGE AND REDUCE SYMPTOMS OF HYPERTENSION  Symptom Management Condition 1: Take medications as prescribed   Attend all scheduled provider appointments Call pharmacy for medication refills 3-7 days in advance of running out of medications Attend church or other social activities Perform all self care activities independently  Perform IADL's (shopping, preparing meals, housekeeping, managing finances) independently Call provider office for new concerns or questions  check blood pressure weekly choose a place to take my blood pressure (home, clinic or office, retail store) write blood pressure results in a log or diary learn about high blood pressure keep a blood pressure log call doctor for signs and symptoms of high blood pressure keep all doctor appointments take medications for blood pressure exactly as prescribed begin an exercise program report new symptoms to your doctor eat more whole grains, fruits and vegetables, lean meats and healthy fats Look over education sent via my chart- low sodium diet  Chronic Condition 2: DIABETES Provider Assessment and Plan  Type 2 diabetes mellitus with other  specified complication ([place]) - Primary     Relevant Orders    TSH    Bayer DCA Hb A1c Waived     Expected Outcome/Goals Addressed This Visit (Provider CCM goals/Provider Assessment and plan  CCM (DIABETES) EXPECTED OUTCOME: MONITOR, SELF-MANAGE AND REDUCE SYMPTOMS OF DIABETES  Symptom Management Condition 2: Take medications as prescribed   Attend all scheduled provider appointments Call pharmacy for medication refills 3-7 days in advance of running out of medications Attend church or other social activities Perform all self care activities independently  Perform IADL's (shopping, preparing meals, housekeeping, managing finances) independently Call provider office for new concerns or questions  check blood sugar at prescribed times: twice daily check feet daily for cuts, sores or redness enter blood sugar readings and medication or insulin into daily log take the blood sugar log to all doctor visits take the blood sugar meter to all doctor visits trim toenails straight across fill half of plate with vegetables limit fast food meals to no more than 1 per week manage portion size read food labels for fat, fiber, carbohydrates and portion size keep feet up while sitting Try to do some type of exercise daily even if for a few minutes Pharmacist to call you on 06/29/22 Look over education sent via my chart- hypoglycemia Try to slowly cut down on the soft drinks  Problem List Patient Active Problem List   Diagnosis Date Noted   Type 2 diabetes mellitus with diabetic neuropathy, without long-term current use of insulin ([place]) 06/23/2022   Sebaceous cyst 02/18/2022   Chronic peptic ulcer of stomach 12/04/2021   Family history of malignant neoplasm of digestive organs 12/04/2021   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   Personal history of colonic polyps 12/04/2021   Renal insufficiency 12/04/2021   History  of TIAs 04/26/2018   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   Mixed  conductive and sensorineural hearing loss of both ears 01/27/2018   Statin-induced myositis 08/09/2017   Obesity, Class III, BMI 40-49.9 (morbid obesity) ([place]) 10/28/2016   Primary hyperparathyroidism ([place]) 05/06/2015   Type 2 diabetes mellitus with other specified complication ([facility])    GERD (gastroesophageal reflux disease) 11/05/2013   Fatty liver 06/01/2013   Hypercalcemia 05/14/2013   Hypertension associated with diabetes ([place])    Hypothyroid    Migraine    Polio    Seasonal allergies    Vitamin D deficiency    Aphasia 04/11/2012   OBSTRUCTIVE SLEEP APNEA 05/31/2008   Hyperlipidemia associated with type 2 diabetes mellitus ([place]) 05/31/2008   OVARIAN CYST 05/31/2008    Medication Management  Current Outpatient Medications:    acetaminophen (TYLENOL) 650 MG CR tablet, Take 650 mg by mouth in the morning., Disp: , Rfl:    albuterol (VENTOLIN HFA) 108 (90 Base) MCG/ACT inhaler, Inhale 2 puffs into the lungs every 6 (six) hours as needed for wheezing or shortness of breath., Disp: 8 g, Rfl: 5   allopurinol (ZYLOPRIM) 100 MG tablet, Take 1 tablet (100 mg total) by mouth daily., Disp: 90 tablet, Rfl: 3   Blood Glucose Monitoring Suppl (BLOOD GLUCOSE MONITOR SYSTEM) w/Device KIT, Use glucose meter to check blood sugar up to 3 times daily as instructed. One Touch Verio preferred by insurance. Dx: EE11.69, Disp: 1 each, Rfl: 0   carvedilol (COREG) 6.25 MG tablet, Take 1 tablet (6.25 mg total) by mouth 2 (two) times daily with a meal., Disp: 180 tablet, Rfl: 3   Cholecalciferol (VITAMIN D3) 2000 UNITS capsule, Take 2,000 Units by mouth 2 (two) times daily. , Disp: , Rfl:    clobetasol ointment (TEMOVATE) [id number] %, Apply 1 Application topically 2 (two) times daily., Disp: 60 g, Rfl: 3   clopidogrel (PLAVIX) 75 MG tablet, Take 1 tablet (75 mg total) by mouth every evening., Disp: 90 tablet, Rfl: 3   clotrimazole-betamethasone (LOTRISONE) cream, Apply 1 Application topically daily., Disp: 90 g,  Rfl: 2   Elderberry 500 MG CAPS, Take 500 mg by mouth daily., Disp: , Rfl:    empagliflozin (JARDIANCE) 25 MG TABS tablet, Take 1 tablet (25 mg total) by mouth daily before breakfast., Disp: 90 tablet, Rfl: 3   furosemide (LASIX) 40 MG tablet, Take 1 tablet (40 mg total) by mouth daily., Disp: 90 tablet, Rfl: 3   glucose blood (ONETOUCH VERIO) test strip, USE 1 STRIP TO CHECK GLUCOSE UP TO THREE TIMES DAILY AS  INSTRUCTED, Disp: 100 each, Rfl: 9   L-FORMULA LYSINE HCL PO, Take 1 Dose by mouth daily., Disp: , Rfl:    Lancets Misc. KIT, Use patient & insurance preferred lancet device to test blood glucose up to 3 times daily for Dx: E11.69, Disp: 100 each, Rfl: 12   levothyroxine (SYNTHROID) 137 MCG tablet, Take 1 tablet (137 mcg total) by mouth daily before breakfast., Disp: 90 tablet, Rfl: 1   losartan (COZAAR) 25 MG tablet, Take 1 tablet (25 mg total) by mouth daily., Disp: 90 tablet, Rfl: 3   metFORMIN (GLUCOPHAGE-XR) 500 MG 24 hr tablet, Take 1 tablet (500 mg total) by mouth daily with breakfast., Disp: 30 tablet, Rfl: 1   nystatin (MYCOSTATIN/NYSTOP) powder, Apply 1 Application topically 3 (three) times daily., Disp: 60 g, Rfl: 1   Omega-3 Fatty Acids (FISH OIL TRIPLE STRENGTH) 1400 MG CAPS, Take 1,000 mg by mouth 4 (four) times daily. ,  Disp: , Rfl:    pantoprazole (PROTONIX) 40 MG tablet, Take 1 tablet (40 mg total) by mouth daily., Disp: 90 tablet, Rfl: 3   rosuvastatin (CRESTOR) 5 MG tablet, Take 1 tablet (5 mg total) by mouth daily., Disp: 90 tablet, Rfl: 3   sitaGLIPtin (JANUVIA) 100 MG tablet, Take 1 tablet (100 mg total) by mouth daily., Disp: 90 tablet, Rfl: 1  Cognitive Assessment Identity Confirmed: : Name; DOB Cognitive Status: Normal   Functional Assessment Hearing Difficulty or Deaf: yes Hearing Management: has hearing aide right ear Wear Glasses or Blind: yes Vision Management: can see well w/ glasses Concentrating, Remembering or Making Decisions Difficulty (CP):  no Difficulty Communicating: no Difficulty Eating/Swallowing: no Walking or Climbing Stairs Difficulty: no Dressing/Bathing Difficulty: no Doing Errands Independently Difficulty (such as shopping) (CP): no   Caregiver Assessment  Primary Source of Support/Comfort: spouse Name of Support/Comfort Primary Source: Karen Potter spouse People in Home: spouse   Planned Interventions  Provided education to patient about basic DM disease process; Reviewed medications with patient and discussed importance of medication adherence;        Reviewed prescribed diet with patient carbohydrate modified; Counseled on importance of regular laboratory monitoring as prescribed;        Discussed plans with patient for ongoing care management follow up and provided patient with direct contact information for care management team;      Provided patient with written educational materials related to hypo and hyperglycemia and importance of correct treatment;       Advised patient, providing education and rationale, to check cbg twice daily and record        call provider for findings outside established parameters;       Review of patient status, including review of consultants reports, relevant laboratory and other test results, and medications completed;       Screening for signs and symptoms of depression related to chronic disease state;        Assessed social determinant of health barriers;        Reviewed upcoming scheduled appointments Evaluation of current treatment plan related to hypertension self management and patient's adherence to plan as established by provider;   Reviewed prescribed diet low sodium Reviewed medications with patient and discussed importance of compliance;  Counseled on the importance of exercise goals with target of 150 minutes per week Discussed plans with patient for ongoing care management follow up and provided patient with direct contact information for care management  team; Advised patient, providing education and rationale, to monitor blood pressure daily and record, calling PCP for findings outside established parameters;  Advised patient to discuss any issues with blood pressure, medications/ side effects with provider; Provided education on prescribed diet low sodium;  Discussed complications of poorly controlled blood pressure such as heart disease, stroke, circulatory complications, vision complications, kidney impairment, sexual dysfunction;  Screening for signs and symptoms of depression related to chronic disease state;  Assessed social determinant of health barriers;   Interaction and coordination with outside resources, practitioners, and providers See CCM Referral  Care Plan: Available in MyChart

## 2022-06-23 NOTE — Patient Instructions (Signed)
Please call the care guide team at [phone] if you need to cancel or reschedule your appointment.   If you are experiencing a Mental Health or [place] or need someone to talk to, please call the Suicide and Crisis Lifeline: 988 call the [place] National Suicide Prevention Lifeline: 508-854-6487 or TTY: 574-002-0726 TTY 304-395-6415) to talk to a trained counselor call 1-800-273-TALK (toll free, 24 hour hotline) go to [facility] [address], [place] [phone]) call the [facility]: [phone] call 911   Following is a copy of the CCM Program Consent:  CCM service includes personalized support from designated clinical staff supervised by the physician, including individualized plan of care and coordination with other care providers 24/7 contact phone numbers for assistance for urgent and routine care needs. Service will only be billed when office clinical staff spend 20 minutes or more in a month to coordinate care. Only one practitioner may furnish and bill the service in a calendar month. The patient may stop CCM services at amy time (effective at the end of the month) by phone call to the office staff. The patient will be responsible for cost sharing (co-pay) or up to 20% of the service fee (after annual deductible is met)  Following is a copy of your full provider care plan:   Goals Addressed             This Visit's Progress    CCM (DIABETES) EXPECTED OUTCOME: MONITOR, SELF-MANAGE AND REDUCE SYMPTOMS OF DIABETES       Current Barriers:  Knowledge Deficits related to Diabetes management Care Coordination needs related to medications (currently working with pharmacist) in a patient with Diabetes Chronic Disease Management support and education needs related to Diabetes, diet Patient reports she checks CBG BID with fasting ranges near 200 with today's reading 203, random ranges 190-230, pt reports  she has had recent medications changes and hopes this makes a difference, [facility] pharmacist has been working with patient Patient reports she drinks water and soft drinks  Patient does not participate in exercise at present  Planned Interventions: Provided education to patient about basic DM disease process; Reviewed medications with patient and discussed importance of medication adherence;        Reviewed prescribed diet with patient carbohydrate modified; Counseled on importance of regular laboratory monitoring as prescribed;        Discussed plans with patient for ongoing care management follow up and provided patient with direct contact information for care management team;      Provided patient with written educational materials related to hypo and hyperglycemia and importance of correct treatment;       Advised patient, providing education and rationale, to check cbg twice daily and record        call provider for findings outside established parameters;       Review of patient status, including review of consultants reports, relevant laboratory and other test results, and medications completed;       Screening for signs and symptoms of depression related to chronic disease state;        Assessed social determinant of health barriers;        Reviewed upcoming scheduled appointments  Symptom Management: Take medications as prescribed   Attend all scheduled provider appointments Call pharmacy for medication refills 3-7 days in advance of running out of medications Attend church or other social activities Perform all self care activities independently  Perform IADL's (shopping, preparing meals, housekeeping,  managing finances) independently Call provider office for new concerns or questions  check blood sugar at prescribed times: twice daily check feet daily for cuts, sores or redness enter blood sugar readings and medication or insulin into daily log take the blood sugar log to all  doctor visits take the blood sugar meter to all doctor visits trim toenails straight across fill half of plate with vegetables limit fast food meals to no more than 1 per week manage portion size read food labels for fat, fiber, carbohydrates and portion size keep feet up while sitting Try to do some type of exercise daily even if for a few minutes Pharmacist to call you on 06/29/22 Look over education sent via my chart- hypoglycemia Try to slowly cut down on the soft drinks  Follow Up Plan: Telephone follow up appointment with care management team member scheduled for:  08/04/22 at 215 pm       CCM (HYPERTENSION) EXPECTED OUTCOME: MONITOR, SELF-MANAGE AND REDUCE SYMPTOMS OF HYPERTENSION       Current Barriers:  Knowledge Deficits related to Hypertension management Chronic Disease Management support and education needs related to Hypertension, diet, exercise Patient reports she lives with spouse, is independent in all aspects of her care, continues to drive, does not exercise Patient reports she checks blood pressure on occasion and readings " are usually good"  Planned Interventions: Evaluation of current treatment plan related to hypertension self management and patient's adherence to plan as established by provider;   Reviewed prescribed diet low sodium Reviewed medications with patient and discussed importance of compliance;  Counseled on the importance of exercise goals with target of 150 minutes per week Discussed plans with patient for ongoing care management follow up and provided patient with direct contact information for care management team; Advised patient, providing education and rationale, to monitor blood pressure daily and record, calling PCP for findings outside established parameters;  Advised patient to discuss any issues with blood pressure, medications/ side effects with provider; Provided education on prescribed diet low sodium;  Discussed complications of poorly  controlled blood pressure such as heart disease, stroke, circulatory complications, vision complications, kidney impairment, sexual dysfunction;  Screening for signs and symptoms of depression related to chronic disease state;  Assessed social determinant of health barriers;   Symptom Management: Take medications as prescribed   Attend all scheduled provider appointments Call pharmacy for medication refills 3-7 days in advance of running out of medications Attend church or other social activities Perform all self care activities independently  Perform IADL's (shopping, preparing meals, housekeeping, managing finances) independently Call provider office for new concerns or questions  check blood pressure weekly choose a place to take my blood pressure (home, clinic or office, retail store) write blood pressure results in a log or diary learn about high blood pressure keep a blood pressure log call doctor for signs and symptoms of high blood pressure keep all doctor appointments take medications for blood pressure exactly as prescribed begin an exercise program report new symptoms to your doctor eat more whole grains, fruits and vegetables, lean meats and healthy fats Look over education sent via my chart- low sodium diet  Follow Up Plan: Telephone follow up appointment with care management team member scheduled for:  08/04/22 at 215 pm          Patient verbalizes understanding of instructions and care plan provided today and agrees to view in [facility]. Active MyChart status and patient understanding of how to access instructions and care plan  via MyChart confirmed with patient.     Telephone follow up appointment with care management team member scheduled for:  08/04/22 at 215 pm  Low-Sodium Eating Plan Sodium, which is an element that makes up salt, helps you maintain a healthy balance of fluids in your body. Too much sodium can increase your blood pressure and cause fluid and waste  to be held in your body. Your health care provider or dietitian may recommend following this plan if you have high blood pressure (hypertension), kidney disease, liver disease, or heart failure. Eating less sodium can help lower your blood pressure, reduce swelling, and protect your heart, liver, and kidneys. What are tips for following this plan? Reading food labels The Nutrition Facts label lists the amount of sodium in one serving of the food. If you eat more than one serving, you must multiply the listed amount of sodium by the number of servings. Choose foods with less than 140 mg of sodium per serving. Avoid foods with 300 mg of sodium or more per serving. Shopping  Look for lower-sodium products, often labeled as "low-sodium" or "no salt added." Always check the sodium content, even if foods are labeled as "unsalted" or "no salt added." Buy fresh foods. Avoid canned foods and pre-made or frozen meals. Avoid canned, cured, or processed meats. Buy breads that have less than 80 mg of sodium per slice. Cooking  Eat more home-cooked food and less restaurant, buffet, and fast food. Avoid adding salt when cooking. Use salt-free seasonings or herbs instead of table salt or sea salt. Check with your health care provider or pharmacist before using salt substitutes. Cook with plant-based oils, such as canola, sunflower, or olive oil. Meal planning When eating at a restaurant, ask that your food be prepared with less salt or no salt, if possible. Avoid dishes labeled as brined, pickled, cured, smoked, or made with soy sauce, miso, or teriyaki sauce. Avoid foods that contain MSG (monosodium glutamate). MSG is sometimes added to [place] food, bouillon, and some canned foods. Make meals that can be grilled, baked, poached, roasted, or steamed. These are generally made with less sodium. General information Most people on this plan should limit their sodium intake to 1,500-2,000 mg (milligrams) of  sodium each day. What foods should I eat? Fruits Fresh, frozen, or canned fruit. Fruit juice. Vegetables Fresh or frozen vegetables. "No salt added" canned vegetables. "No salt added" tomato sauce and paste. Low-sodium or reduced-sodium tomato and vegetable juice. Grains Low-sodium cereals, including oats, puffed wheat and rice, and shredded wheat. Low-sodium crackers. Unsalted rice. Unsalted pasta. Low-sodium bread. Whole-grain breads and whole-grain pasta. Meats and other proteins Fresh or frozen (no salt added) meat, poultry, seafood, and fish. Low-sodium canned tuna and salmon. Unsalted nuts. Dried peas, beans, and lentils without added salt. Unsalted canned beans. Eggs. Unsalted nut butters. Dairy Milk. Soy milk. Cheese that is naturally low in sodium, such as ricotta cheese, fresh mozzarella, or Swiss cheese. Low-sodium or reduced-sodium cheese. Cream cheese. Yogurt. Seasonings and condiments Fresh and dried herbs and spices. Salt-free seasonings. Low-sodium mustard and ketchup. Sodium-free salad dressing. Sodium-free light mayonnaise. Fresh or refrigerated horseradish. Lemon juice. Vinegar. Other foods Homemade, reduced-sodium, or low-sodium soups. Unsalted popcorn and pretzels. Low-salt or salt-free chips. The items listed above may not be a complete list of foods and beverages you can eat. Contact a dietitian for more information. What foods should I avoid? Vegetables Sauerkraut, pickled vegetables, and relishes. Olives. [place] fries. Onion rings. Regular canned vegetables (not low-sodium  or reduced-sodium). Regular canned tomato sauce and paste (not low-sodium or reduced-sodium). Regular tomato and vegetable juice (not low-sodium or reduced-sodium). Frozen vegetables in sauces. Grains Instant hot cereals. Bread stuffing, pancake, and biscuit mixes. Croutons. Seasoned rice or pasta mixes. Noodle soup cups. Boxed or frozen macaroni and cheese. Regular salted crackers. Self-rising  flour. Meats and other proteins Meat or fish that is salted, canned, smoked, spiced, or pickled. Precooked or cured meat, such as sausages or meat loaves. Berniece Salines. Ham. Pepperoni. Hot dogs. Corned beef. Chipped beef. Salt pork. Jerky. Pickled herring. Anchovies and sardines. Regular canned tuna. Salted nuts. Dairy Processed cheese and cheese spreads. Hard cheeses. Cheese curds. Blue cheese. Feta cheese. String cheese. Regular cottage cheese. Buttermilk. Canned milk. Fats and oils Salted butter. Regular margarine. Ghee. Bacon fat. Seasonings and condiments Onion salt, garlic salt, seasoned salt, table salt, and sea salt. Canned and packaged gravies. Worcestershire sauce. Tartar sauce. Barbecue sauce. Teriyaki sauce. Soy sauce, including reduced-sodium. Steak sauce. Fish sauce. Oyster sauce. Cocktail sauce. Horseradish that you find on the shelf. Regular ketchup and mustard. Meat flavorings and tenderizers. Bouillon cubes. Hot sauce. Pre-made or packaged marinades. Pre-made or packaged taco seasonings. Relishes. Regular salad dressings. Salsa. Other foods Salted popcorn and pretzels. Corn chips and puffs. Potato and tortilla chips. Canned or dried soups. Pizza. Frozen entrees and pot pies. The items listed above may not be a complete list of foods and beverages you should avoid. Contact a dietitian for more information. Summary Eating less sodium can help lower your blood pressure, reduce swelling, and protect your heart, liver, and kidneys. Most people on this plan should limit their sodium intake to 1,500-2,000 mg (milligrams) of sodium each day. Canned, boxed, and frozen foods are high in sodium. Restaurant foods, fast foods, and pizza are also very high in sodium. You also get sodium by adding salt to food. Try to cook at home, eat more fresh fruits and vegetables, and eat less fast food and canned, processed, or prepared foods. This information is not intended to replace advice given to you by your  health care provider. Make sure you discuss any questions you have with your health care provider. Document Revised: 06/01/2019 Document Reviewed: 03/28/2019 Elsevier Patient Education  [place]. Hypoglycemia Hypoglycemia is when the sugar (glucose) level in your blood is too low. Low blood sugar can happen to people who have diabetes and people who do not have diabetes. Low blood sugar can happen quickly, and it can be an emergency. What are the causes? This condition happens most often in people who have diabetes. It may be caused by: Diabetes medicine. Not eating enough, or not eating often enough. Doing more physical activity. Drinking alcohol on an empty stomach. If you do not have diabetes, this condition may be caused by: A tumor in the pancreas. Not eating enough, or not eating for long periods at a time (fasting). A very bad infection or illness. Problems after having weight loss (bariatric) surgery. Kidney failure or liver failure. Certain medicines. What increases the risk? This condition is more likely to develop in people who: Have diabetes and take medicines to lower their blood sugar. Abuse alcohol. Have a very bad illness. What are the signs or symptoms? Mild Hunger. Sweating and feeling clammy. Feeling dizzy or light-headed. Being sleepy or having trouble sleeping. Feeling like you may vomit (nauseous). A fast heartbeat. A headache. Blurry vision. Mood changes, such as: Being grouchy. Feeling worried or nervous (anxious). Tingling or loss of feeling (numbness)  around your mouth, lips, or tongue. Moderate Confusion and poor judgment. Behavior changes. Weakness. Uneven heartbeat. Trouble with moving (coordination). Very low Very low blood sugar (severe hypoglycemia) is a medical emergency. It can cause: Fainting. Seizures. Loss of consciousness (coma). Death. How is this treated? Treating low blood sugar Low blood sugar is often treated by  eating or drinking something that has sugar in it right away. The food or drink should contain 15 grams of a fast-acting carb (carbohydrate). Options include: 4 oz (120 mL) of fruit juice. 4 oz (120 mL) of regular soda (not diet soda). A few pieces of hard candy. Check food labels to see how many pieces to eat for 15 grams. 1 Tbsp (15 mL) of sugar or honey. 4 glucose tablets. 1 tube of glucose gel. Treating low blood sugar if you have diabetes If you can think clearly and swallow safely, follow the 15:15 rule: Take 15 grams of a fast-acting carb. Talk with your doctor about how much you should take. Always keep a source of fast-acting carb with you, such as: Glucose tablets (take 4 tablets). A few pieces of hard candy. Check food labels to see how many pieces to eat for 15 grams. 4 oz (120 mL) of fruit juice. 4 oz (120 mL) of regular soda (not diet soda). 1 Tbsp (15 mL) of honey or sugar. 1 tube of glucose gel. Check your blood sugar 15 minutes after you take the carb. If your blood sugar is still at or below 70 mg/dL (3.9 mmol/L), take 15 grams of a carb again. If your blood sugar does not go above 70 mg/dL (3.9 mmol/L) after 3 tries, get help right away. After your blood sugar goes back to normal, eat a meal or a snack within 1 hour.  Treating very low blood sugar If your blood sugar is below 54 mg/dL (3 mmol/L), you have very low blood sugar, or severe hypoglycemia. This is an emergency. Get medical help right away. If you have very low blood sugar and you cannot eat or drink, you will need to be given a hormone called glucagon. A family member or friend should learn how to check your blood sugar and how to give you glucagon. Ask your doctor if you need to have an emergency glucagon kit at home. Very low blood sugar may also need to be treated in a hospital. Follow these instructions at home: General instructions Take over-the-counter and prescription medicines only as told by your  doctor. Stay aware of your blood sugar as told by your doctor. If you drink alcohol: Limit how much you have to: 0-1 drink a day for women who are not pregnant. 0-2 drinks a day for men. Know how much alcohol is in your drink. In the [place] one drink equals one 12 oz bottle of beer (355 mL), one 5 oz glass of wine (148 mL), or one 1 oz glass of hard liquor (44 mL). Be sure to eat food when you drink alcohol. Know that your body absorbs alcohol quickly. This may lead to low blood sugar later. Be sure to keep checking your blood sugar. Keep all follow-up visits. If you have diabetes:  Always have a fast-acting carb (15 grams) with you to treat low blood sugar. Follow your diabetes care plan as told by your doctor. Make sure you: Know the symptoms of low blood sugar. Check your blood sugar as often as told. Always check it before and after exercise. Always check your blood sugar before  you drive. Take your medicines as told. Follow your meal plan. Eat on time. Do not skip meals. Share your diabetes care plan with: Your work or school. People you live with. Carry a card or wear jewelry that says you have diabetes. Where to find more information American Diabetes Association: www.diabetes.org Contact a doctor if: You have trouble keeping your blood sugar in your target range. You have low blood sugar often. Get help right away if: You still have symptoms after you eat or drink something that contains 15 grams of fast-acting carb, and you cannot get your blood sugar above 70 mg/dL by following the 15:15 rule. Your blood sugar is below 54 mg/dL (3 mmol/L). You have a seizure. You faint. These symptoms may be an emergency. Get help right away. Call your local emergency services (911 in the [place] Do not wait to see if the symptoms will go away. Do not drive yourself to the hospital. Summary Hypoglycemia happens when the level of sugar (glucose) in your blood is too low. Low blood sugar  can happen to people who have diabetes and people who do not have diabetes. Low blood sugar can happen quickly, and it can be an emergency. Make sure you know the symptoms of low blood sugar and know how to treat it. Always keep a source of sugar (fast-acting carb) with you to treat low blood sugar. This information is not intended to replace advice given to you by your health care provider. Make sure you discuss any questions you have with your health care provider. Document Revised: 03/27/2020 Document Reviewed: 03/27/2020 Elsevier Patient Education  [place].

## 2022-06-23 NOTE — Addendum Note (Signed)
Addended by: Caryl Pina on: 06/23/2022 09:09 AM   Modules accepted: Orders

## 2022-06-23 NOTE — Progress Notes (Signed)
BP 130/74   Pulse 68   Ht 5' 4"$  (1.626 m)   Wt 240 lb (108.9 kg)   SpO2 97%   BMI 41.20 kg/m    Subjective:   Patient ID: Karen Potter, female    DOB: 1941/03/16, 82 y.o.   MRN: [id number]  HPI: Karen Potter is a 82 y.o. female presenting on 06/23/2022 for Medical Management of Chronic Issues, Hypertension, and Diabetes   HPI Type 2 diabetes mellitus Patient comes in today for recheck of his diabetes. Patient has been currently taking Januvia, has metformin but has not started it yet, just picked it up yesterday. Patient is currently on an ACE inhibitor/ARB. Patient has not seen an ophthalmologist this year. Patient denies any issues with their feet. The symptom started onset as an adult neuropathy and diabetes and hyperlipidemia and fatty liver ARE RELATED TO DM   Hypertension Patient is currently on carvedilol and furosemide and losartan, and their blood pressure today is 130/74. Patient denies any lightheadedness or dizziness. Patient denies headaches, blurred vision, chest pains, shortness of breath, or weakness. Denies any side effects from medication and is content with current medication.   Hyperlipidemia Patient is coming in for recheck of his hyperlipidemia. The patient is currently taking fish oil. They deny any issues with myalgias or history of liver damage from it. They deny any focal numbness or weakness or chest pain.   Hypothyroidism recheck Patient is coming in for thyroid recheck today as well. They deny any issues with hair changes or heat or cold problems or diarrhea or constipation. They deny any chest pain or palpitations. They are currently on levothyroxine 0000000 micrograms   Lichen sclerosus Patient was diagnosed by her gynecologist for lichen sclerosis and she is clobetasol for it to help keep it at May.  She says it does help  Relevant past medical, surgical, family and social history reviewed and updated as indicated. Interim medical history since our  last visit reviewed. Allergies and medications reviewed and updated.  Review of Systems  Constitutional:  Negative for chills and fever.  HENT:  Negative for congestion, ear discharge and ear pain.   Eyes:  Negative for visual disturbance.  Respiratory:  Negative for chest tightness and shortness of breath.   Cardiovascular:  Negative for chest pain and leg swelling.  Gastrointestinal:  Negative for constipation, diarrhea, nausea and vomiting.  Genitourinary:  Negative for difficulty urinating and dysuria.  Musculoskeletal:  Negative for back pain and gait problem.  Skin:  Negative for rash.  Neurological:  Negative for light-headedness and headaches.  Psychiatric/Behavioral:  Negative for agitation and behavioral problems.   All other systems reviewed and are negative.   Per HPI unless specifically indicated above   Allergies as of 06/23/2022       Reactions   Codeine Itching   Dilaudid [hydromorphone] Other (See Comments)   Mouth blisters   Other Itching   Most pain meds cause itching.  When has to take pain medication, she has been instructed to take Benadryl   Hydrocodone-acetaminophen Other (See Comments)   Doxycycline Rash, Other (See Comments)        Medication List        Accurate as of June 23, 2022  9:08 AM. If you have any questions, ask your nurse or doctor.          STOP taking these medications    brompheniramine-pseudoephedrine-DM 30-2-10 MG/5ML syrup Stopped by: Worthy Rancher, MD   famotidine 10 MG  tablet Commonly known as: PEPCID Stopped by: Fransisca Kaufmann Niva Murren, MD       TAKE these medications    acetaminophen 650 MG CR tablet Commonly known as: TYLENOL Take 650 mg by mouth in the morning.   albuterol 108 (90 Base) MCG/ACT inhaler Commonly known as: VENTOLIN HFA Inhale 2 puffs into the lungs every 6 (six) hours as needed for wheezing or shortness of breath.   allopurinol 100 MG tablet Commonly known as: ZYLOPRIM Take 1  tablet (100 mg total) by mouth daily.   Blood Glucose Monitor System w/Device Kit Use glucose meter to check blood sugar up to 3 times daily as instructed. One Touch Verio preferred by insurance. 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   clobetasol ointment 0.05 % Commonly known as: TEMOVATE Apply 1 Application topically 2 (two) times daily. Started by: Worthy Rancher, MD   clopidogrel 75 MG tablet Commonly known as: PLAVIX Take 1 tablet (75 mg total) by mouth every evening.   clotrimazole-betamethasone cream Commonly known as: LOTRISONE Apply 1 Application topically daily.   Elderberry 500 MG Caps Take 500 mg by mouth daily.   Fish Oil Triple Strength 1400 MG Caps Take 1,000 mg by mouth 4 (four) times daily.   furosemide 40 MG tablet Commonly known as: LASIX Take 1 tablet (40 mg total) by mouth daily.   L-FORMULA LYSINE HCL PO Take 1 Dose by mouth daily.   Lancets Misc. Kit Use patient & insurance preferred lancet device to test blood glucose up to 3 times daily for Dx: E11.69   levothyroxine 137 MCG tablet Commonly known as: SYNTHROID Take 1 tablet (137 mcg total) by mouth daily before breakfast.   losartan 25 MG tablet Commonly known as: COZAAR Take 1 tablet (25 mg total) by mouth daily.   metFORMIN 500 MG 24 hr tablet Commonly known as: GLUCOPHAGE-XR Take 1 tablet (500 mg total) by mouth daily with breakfast.   nystatin powder Commonly known as: MYCOSTATIN/NYSTOP Apply 1 Application topically 3 (three) times daily.   OneTouch Verio test strip Generic drug: glucose blood USE 1 STRIP TO CHECK GLUCOSE UP TO THREE TIMES DAILY AS  INSTRUCTED   pantoprazole 40 MG tablet Commonly known as: Protonix Take 1 tablet (40 mg total) by mouth daily.   rosuvastatin 5 MG tablet Commonly known as: Crestor Take 1 tablet (5 mg total) by mouth daily. Started by: Fransisca Kaufmann Azelyn Batie, MD   sitaGLIPtin 100 MG tablet Commonly known as: Januvia Take 1 tablet (100 mg total) by mouth daily.   Vitamin D3 50 MCG (2000 UT) Caps Generic drug: Cholecalciferol Take 2,000 Units by mouth 2 (two) times daily.         Objective:   BP 130/74   Pulse 68   Ht 5' 4"$  (1.626 m)   Wt 240 lb (108.9 kg)   SpO2 97%   BMI 41.20 kg/m   Wt Readings from Last 3 Encounters:  06/23/22 240 lb (108.9 kg)  06/16/22 239 lb 9.6 oz (108.7 kg)  04/08/22 244 lb (110.7 kg)    Physical Exam Vitals and nursing note reviewed.  Constitutional:      General: She is not in acute distress.    Appearance: She is well-developed. She is not diaphoretic.  Eyes:     Conjunctiva/sclera: Conjunctivae normal.  Cardiovascular:     Rate and Rhythm: Normal rate and  regular rhythm.     Heart sounds: Normal heart sounds. No murmur heard. Pulmonary:     Effort: Pulmonary effort is normal. No respiratory distress.     Breath sounds: Normal breath sounds. No wheezing.  Skin:    General: Skin is warm and dry.     Findings: No rash.  Neurological:     Mental Status: She is alert and oriented to person, place, and time.     Coordination: Coordination normal.  Psychiatric:        Behavior: Behavior normal.       Assessment & Plan:   Problem List Items Addressed This Visit       Cardiovascular and Mediastinum   Hypertension associated with diabetes ([place])   Relevant Medications   furosemide (LASIX) 40 MG tablet   rosuvastatin (CRESTOR) 5 MG tablet   Other Relevant Orders   CBC with Differential/Platelet   CMP14+EGFR   Lipid panel   TSH   Bayer DCA Hb A1c Waived   AMB Referral to Pharmacy Medication Management     Endocrine   Hyperlipidemia associated with type 2 diabetes mellitus (HCC)   Relevant Medications   furosemide (LASIX) 40 MG tablet   rosuvastatin (CRESTOR) 5 MG tablet   Other Relevant Orders   CBC with Differential/Platelet   CMP14+EGFR   Lipid panel   TSH    Bayer DCA Hb A1c Waived   Hypothyroid   Relevant Orders   CBC with Differential/Platelet   CMP14+EGFR   Lipid panel   TSH   Bayer DCA Hb A1c Waived   Type 2 diabetes mellitus with other specified complication (HCC) - Primary   Relevant Medications   glucose blood (ONETOUCH VERIO) test strip   rosuvastatin (CRESTOR) 5 MG tablet   Other Relevant Orders   CBC with Differential/Platelet   CMP14+EGFR   Lipid panel   TSH   Bayer DCA Hb A1c Waived   AMB Referral to Pharmacy Medication Management   Type 2 diabetes mellitus with diabetic neuropathy, without long-term current use of insulin (HCC)   Relevant Medications   rosuvastatin (CRESTOR) 5 MG tablet   Other Relevant Orders   AMB Referral to Pharmacy Medication Management   Other Visit Diagnoses     Lichen sclerosus of female genitalia       Relevant Medications   clobetasol ointment (TEMOVATE) 0.05 %       Patient has some changes to bowels and that they are thinner but is not diarrhea or constipation.  She also says she has been frequent bowel movements 3-4 times a day.  Recommended she go talk to her gastroenterologist  Blood sugars have been running up at home in the 200s.  A1c is 9.5, elevated from before, gave sample for Jardiance 25 mg daily. Patient does have a little bit of numbness on few of her toes and just encouraged an eye on that.  And watching for any wounds. Follow up plan: Return in about 3 months (around 09/21/2022), or if symptoms worsen or fail to improve, for Diabetes recheck.  Counseling provided for all of the vaccine components Orders Placed This Encounter  Procedures   CBC with Differential/Platelet   CMP14+EGFR   Lipid panel   TSH   Bayer DCA Hb A1c Waived   AMB Referral to Pharmacy Medication Management    Karen Pina, MD [facility] 06/23/2022, 9:08 AM

## 2022-06-23 NOTE — Chronic Care Management (AMB) (Signed)
Chronic Care Management   CCM RN Visit Note  06/23/2022 Name: Karen Potter MRN: [id number] DOB: 1940/10/23  Subjective: Karen Potter is a 82 y.o. year old female who is a primary care patient of Dettinger, Fransisca Kaufmann, MD. The patient was referred to the Chronic Care Management team for assistance with care management needs subsequent to provider initiation of CCM services and plan of care.    Today's Visit:  Engaged with patient by telephone for initial visit.     SDOH Interventions Today    Flowsheet Row Most Recent Value  SDOH Interventions   Food Insecurity Interventions Intervention Not Indicated  Housing Interventions Intervention Not Indicated  Transportation Interventions Intervention Not Indicated  Utilities Interventions Intervention Not Indicated  Financial Strain Interventions Intervention Not Indicated  Physical Activity Interventions Patient Refused  Stress Interventions Intervention Not Indicated  Social Connections Interventions Intervention Not Indicated         Goals Addressed             This Visit's Progress    CCM (DIABETES) EXPECTED OUTCOME: MONITOR, SELF-MANAGE AND REDUCE SYMPTOMS OF DIABETES       Current Barriers:  Knowledge Deficits related to Diabetes management Care Coordination needs related to medications (currently working with pharmacist) in a patient with Diabetes Chronic Disease Management support and education needs related to Diabetes, diet Patient reports she checks CBG BID with fasting ranges near 200 with today's reading 203, random ranges 190-230, pt reports she has had recent medications changes and hopes this makes a difference, [facility] pharmacist has been working with patient Patient reports she drinks water and soft drinks  Patient does not participate in exercise at present  Planned Interventions: Provided education to patient about basic DM disease process; Reviewed medications with patient and discussed importance of  medication adherence;        Reviewed prescribed diet with patient carbohydrate modified; Counseled on importance of regular laboratory monitoring as prescribed;        Discussed plans with patient for ongoing care management follow up and provided patient with direct contact information for care management team;      Provided patient with written educational materials related to hypo and hyperglycemia and importance of correct treatment;       Advised patient, providing education and rationale, to check cbg twice daily and record        call provider for findings outside established parameters;       Review of patient status, including review of consultants reports, relevant laboratory and other test results, and medications completed;       Screening for signs and symptoms of depression related to chronic disease state;        Assessed social determinant of health barriers;        Reviewed upcoming scheduled appointments  Symptom Management: Take medications as prescribed   Attend all scheduled provider appointments Call pharmacy for medication refills 3-7 days in advance of running out of medications Attend church or other social activities Perform all self care activities independently  Perform IADL's (shopping, preparing meals, housekeeping, managing finances) independently Call provider office for new concerns or questions  check blood sugar at prescribed times: twice daily check feet daily for cuts, sores or redness enter blood sugar readings and medication or insulin into daily log take the blood sugar log to all doctor visits take the blood sugar meter to all doctor visits trim toenails straight across fill half of plate with vegetables limit fast food  meals to no more than 1 per week manage portion size read food labels for fat, fiber, carbohydrates and portion size keep feet up while sitting Try to do some type of exercise daily even if for a few minutes Pharmacist to call  you on 06/29/22 Look over education sent via my chart- hypoglycemia Try to slowly cut down on the soft drinks  Follow Up Plan: Telephone follow up appointment with care management team member scheduled for:  08/04/22 at 215 pm       CCM (HYPERTENSION) EXPECTED OUTCOME: MONITOR, SELF-MANAGE AND REDUCE SYMPTOMS OF HYPERTENSION       Current Barriers:  Knowledge Deficits related to Hypertension management Chronic Disease Management support and education needs related to Hypertension, diet, exercise Patient reports she lives with spouse, is independent in all aspects of her care, continues to drive, does not exercise Patient reports she checks blood pressure on occasion and readings " are usually good"  Planned Interventions: Evaluation of current treatment plan related to hypertension self management and patient's adherence to plan as established by provider;   Reviewed prescribed diet low sodium Reviewed medications with patient and discussed importance of compliance;  Counseled on the importance of exercise goals with target of 150 minutes per week Discussed plans with patient for ongoing care management follow up and provided patient with direct contact information for care management team; Advised patient, providing education and rationale, to monitor blood pressure daily and record, calling PCP for findings outside established parameters;  Advised patient to discuss any issues with blood pressure, medications/ side effects with provider; Provided education on prescribed diet low sodium;  Discussed complications of poorly controlled blood pressure such as heart disease, stroke, circulatory complications, vision complications, kidney impairment, sexual dysfunction;  Screening for signs and symptoms of depression related to chronic disease state;  Assessed social determinant of health barriers;   Symptom Management: Take medications as prescribed   Attend all scheduled provider  appointments Call pharmacy for medication refills 3-7 days in advance of running out of medications Attend church or other social activities Perform all self care activities independently  Perform IADL's (shopping, preparing meals, housekeeping, managing finances) independently Call provider office for new concerns or questions  check blood pressure weekly choose a place to take my blood pressure (home, clinic or office, retail store) write blood pressure results in a log or diary learn about high blood pressure keep a blood pressure log call doctor for signs and symptoms of high blood pressure keep all doctor appointments take medications for blood pressure exactly as prescribed begin an exercise program report new symptoms to your doctor eat more whole grains, fruits and vegetables, lean meats and healthy fats Look over education sent via my chart- low sodium diet  Follow Up Plan: Telephone follow up appointment with care management team member scheduled for:  08/04/22 at 215 pm          Plan:Telephone follow up appointment with care management team member scheduled for:  08/04/22 at 215 PM  Jacqlyn Larsen [facility], BSN RN Case Manager [place] [phone]

## 2022-06-23 NOTE — Addendum Note (Signed)
Addended by: Alphonzo Dublin on: 06/23/2022 09:39 AM   Modules accepted: Orders

## 2022-06-23 NOTE — Patient Instructions (Signed)

## 2022-06-24 ENCOUNTER — Other Ambulatory Visit (HOSPITAL_COMMUNITY): Payer: Self-pay

## 2022-06-24 ENCOUNTER — Encounter: Payer: Self-pay | Admitting: Pharmacist

## 2022-06-24 LAB — CBC WITH DIFFERENTIAL/PLATELET
Basophils Absolute: 0.1 10*3/uL (ref 0.0–0.2)
Basos: 1 %
EOS (ABSOLUTE): 0.2 10*3/uL (ref 0.0–0.4)
Eos: 4 %
Hematocrit: 38.9 % (ref 34.0–46.6)
Hemoglobin: 12.7 g/dL (ref 11.1–15.9)
Immature Grans (Abs): 0 10*3/uL (ref 0.0–0.1)
Immature Granulocytes: 0 %
Lymphocytes Absolute: 2.6 10*3/uL (ref 0.7–3.1)
Lymphs: 41 %
MCH: 30.5 pg (ref 26.6–33.0)
MCHC: 32.6 g/dL (ref 31.5–35.7)
MCV: 93 fL (ref 79–97)
Monocytes Absolute: 0.9 10*3/uL (ref 0.1–0.9)
Monocytes: 13 %
Neutrophils Absolute: 2.6 10*3/uL (ref 1.4–7.0)
Neutrophils: 41 %
Platelets: 262 10*3/uL (ref 150–450)
RBC: 4.17 x10E6/uL (ref 3.77–5.28)
RDW: 13 % (ref 11.7–15.4)
WBC: 6.4 10*3/uL (ref 3.4–10.8)

## 2022-06-24 LAB — CMP14+EGFR
ALT: 22 IU/L (ref 0–32)
AST: 21 IU/L (ref 0–40)
Albumin/Globulin Ratio: 1.5 (ref 1.2–2.2)
Albumin: 3.8 g/dL (ref 3.8–4.8)
Alkaline Phosphatase: 94 IU/L (ref 44–121)
BUN/Creatinine Ratio: 14 (ref 12–28)
BUN: 10 mg/dL (ref 8–27)
Bilirubin Total: 0.3 mg/dL (ref 0.0–1.2)
CO2: 24 mmol/L (ref 20–29)
Calcium: 9.1 mg/dL (ref 8.7–10.3)
Chloride: 102 mmol/L (ref 96–106)
Creatinine, Ser: 0.74 mg/dL (ref 0.57–1.00)
Globulin, Total: 2.6 g/dL (ref 1.5–4.5)
Glucose: 209 mg/dL — ABNORMAL HIGH (ref 70–99)
Potassium: 3.7 mmol/L (ref 3.5–5.2)
Sodium: 142 mmol/L (ref 134–144)
Total Protein: 6.4 g/dL (ref 6.0–8.5)
eGFR: 82 mL/min/{1.73_m2} (ref >59)

## 2022-06-24 LAB — LIPID PANEL
Chol/HDL Ratio: 4 ratio (ref 0.0–4.4)
Cholesterol, Total: 124 mg/dL (ref 100–199)
HDL: 31 mg/dL — ABNORMAL LOW (ref >39)
LDL Chol Calc (NIH): 54 mg/dL (ref 0–99)
Triglycerides: 243 mg/dL — ABNORMAL HIGH (ref 0–149)
VLDL Cholesterol Cal: 39 mg/dL (ref 5–40)

## 2022-06-24 LAB — TSH: TSH: 0.791 u[IU]/mL (ref 0.450–4.500)

## 2022-06-24 MED ORDER — METFORMIN HCL ER 500 MG PO TB24: 500.0000 mg | ORAL_TABLET | Freq: Two times a day (BID) | ORAL | 1 refills | Status: DC

## 2022-06-24 NOTE — Telephone Encounter (Signed)
Spoke with patient regarding [employer] PAP. Informed patient that company hasn't rec'd application in the mail yet. Let patient know to keep an eye out for any mail from company. She has a 90 day supply at home and is good for now - she used less than a month of her supply.  Patient also wanted to know about assistance for her jardiance that was recently prescribed. Says 90 day supply was about $90+. She has samples and will ask the pharmacy how much a 30 day supply will be if she continues medication.   Educated patient on applying for LIS just in case she needs to move forward with assistance from [facility] for the [place]. Patient expressed understanding.

## 2022-06-25 ENCOUNTER — Telehealth: Payer: Self-pay

## 2022-06-25 NOTE — Telephone Encounter (Signed)
Crestor was sent in because of diabetic guidelines and regulations recommended for all diabetics to be on a low-dose of this to prevent heart disease.  Her cholesterol numbers look good but with her diabetes it is just recommended to try low-dose if she can.

## 2022-06-25 NOTE — Telephone Encounter (Signed)
Pt is not sure why Crestor was sent in for her.  Should she be taking?

## 2022-06-28 NOTE — Telephone Encounter (Signed)
Pt has been made aware of Dr.Dettinger's recommendations. She will continue fish oil and add crestor.

## 2022-06-29 ENCOUNTER — Ambulatory Visit (INDEPENDENT_AMBULATORY_CARE_PROVIDER_SITE_OTHER): Payer: PPO | Admitting: Pharmacist

## 2022-06-29 DIAGNOSIS — E1140 Type 2 diabetes mellitus with diabetic neuropathy, unspecified: Secondary | ICD-10-CM

## 2022-06-29 NOTE — Progress Notes (Signed)
S:    T2DM  82 y.o. female who presents for diabetes evaluation, education, and management.  PMH is significant for T2DM, HTN, HLD, OSA, GERD, and history of TIA.  Patient was last seen by Primary Care Provider, Dr. Warrick Parisian, on 06/23/2022.   I connected with  Karen Potter on 07/06/22 by a telephone enabled telemedicine application and verified that I am speaking with the correct person using two identifiers.   I discussed the limitations of evaluation and management by telemedicine. The patient expressed understanding and agreed to proceed.  PharmD is located in PCP office and Patient is at home during the time of this call.  Today, patient states she has tolerated metformin well and denies any significant changed in her bowel habits. Reports Jardiance copay was $94 and told the pharmacy she would not pick it up if it costs that much. She has been using Jardiance samples provided by Dr. Warrick Parisian. Blood sugars have improved since last week. Patient states she has not applied for Medicare LIS.   Patient reports Diabetes was diagnosed 'a while ago'. She states she has been off medications for 2-3 years.   Current diabetes medications include: Januvia 100 mg daily, metformin 500 mg daily, Jardiance 25 mg daily  Previous diabetes medications: glimepiride (DC'd due to symptomatic hypoglycemia), metformin IR (DC'd due to diarrhea)   Patient reports adherence to taking all medications as prescribed.   Patient denies hypoglycemic events. Reported home fasting blood sugars: 140  Patient denies nocturia (nighttime urination).  Patient reports neuropathy (nerve pain). Patient denies visual changes.  Patient reported dietary habits: Eats 2 meals/day (breakfast and dinner) Breakfast: bacon, eggs, tomatoes, grits Dinner: tortellini soup last night, typically meat and veggies Beverage: Coke (2 day) >> reports BG is high even when she drinks only water  O:  Lab Results  Component Value  Date   HGBA1C 9.5 (H) 06/23/2022   Lipid Panel     Component Value Date/Time   CHOL 124 06/23/2022 0824   CHOL 129 10/03/2012 1310   TRIG 243 (H) 06/23/2022 0824   TRIG 181 (H) 04/10/2013 1206   TRIG 264 (H) 10/03/2012 1310   HDL 31 (L) 06/23/2022 0824   HDL 39 (L) 04/10/2013 1206   HDL 29 (L) 10/03/2012 1310   CHOLHDL 4.0 06/23/2022 0824   CHOLHDL 4.7 06/15/2021 0523   VLDL 48 (H) 06/15/2021 0523   LDLCALC 54 06/23/2022 0824   LDLCALC 37 04/10/2013 1206   LDLCALC 47 10/03/2012 1310    Clinical Atherosclerotic Cardiovascular Disease (ASCVD): Yes - h/o TIA The ASCVD Risk score (Arnett DK, et al., 2019) failed to calculate for the following reasons:   The 2019 ASCVD risk score is only valid for ages 75 to 8   A/P: Diabetes longstanding, currently above goal based on A1c (9.5%). Patient is able to verbalize appropriate hypoglycemia management plan. Medication adherence appears appropriate. Patient has many concerns about the costs of her medications.  -Continue Januvia 100 mg daily.  -Continue Jardiance 25 mg daily.  -Continue metformin XR 500 mg daily. Patient can increase to 1 tablet BID if tolerated.  -Sent patient information on LIS enrollment.  -Patient educated on purpose, proper use, and potential adverse effects of Januvia, metformin.  -Extensively discussed pathophysiology of diabetes, recommended lifestyle interventions, dietary effects on blood sugar control.  -Counseled on s/sx of and management of hypoglycemia.  -Next A1c anticipated May 2024.   Total time counseling 30 minutes.    Follow-up:  Pharmacist PRN.  PCP clinic visit May 2024.  Karen Potter, Pharm.D. PGY-2 Ambulatory Care Pharmacy Resident  Karen Potter, PharmD, BCACP Clinical Pharmacist, [facility]

## 2022-07-06 ENCOUNTER — Encounter: Payer: Self-pay | Admitting: Pharmacist

## 2022-07-08 DIAGNOSIS — I10 Essential (primary) hypertension: Secondary | ICD-10-CM

## 2022-07-08 DIAGNOSIS — E1159 Type 2 diabetes mellitus with other circulatory complications: Secondary | ICD-10-CM | POA: Diagnosis not present

## 2022-07-12 ENCOUNTER — Telehealth: Payer: Self-pay | Admitting: Family Medicine

## 2022-07-12 NOTE — Telephone Encounter (Signed)
Pt called in asking to talk to Apolonio Schneiders due to the message she got on mychart. Pt says that she did apply for [facility] extra help but was denied. Pt says that she opened her last bottle of farxiga yesterday. Pt has a weeks worth of farxiga left. Please call back

## 2022-07-13 ENCOUNTER — Telehealth: Payer: Self-pay | Admitting: Pharmacist

## 2022-07-13 MED ORDER — DAPAGLIFLOZIN PROPANEDIOL 10 MG PO TABS: 10.0000 mg | ORAL_TABLET | Freq: Every day | ORAL | 5 refills | Status: DC

## 2022-07-13 NOTE — Telephone Encounter (Signed)
    07/13/2022 Name: Karen Potter MRN: [id number] DOB: 1940-09-04   Patient notified Pharmacy team about the denial letter she received from social security for LIS/extra help.  We will proceed with patient assistance for [place].  Will route to [place], CPhT to assist with Wilder Glade '10mg'$  daily and Januvia '100mg'$  daily patient assistance paperwork.  [employer] PAP completed, however we are still awaiting [employer] communication & follow up.  Farxiga '10mg'$  daily escribed to [facility] (pharmacy on behalf of [place])  Continue current regimen of Januvia, Metformin & Farxiga/Jardiance samples for now.  Patient tolerating    Regina Eck, PharmD, [facility] Clinical Pharmacist, [facility]

## 2022-07-20 NOTE — Progress Notes (Unsigned)
Cardiology Office Note   Date:  07/21/2022   ID:  Bev, Lawter [birth year], MRN [id number]  PCP:  Dettinger, Fransisca Kaufmann, MD  Cardiologist:   Minus Breeding, MD   No chief complaint on file.    History of Present Illness: Karen Potter is a 82 y.o. female who presents for evaluation of shortness of breath.  The patient did have an echo in May 2019 with a small pericardial effusion but normal EF.  This was done to evaluate cardiomegaly and cough.    She was in the hospital in October  2019 with sepsis related to [place].    The patient was discharged from the hospital on June 15 2021 after an overnight stay.  She called EMS with aphasia and confusion.  Head CT was without acute abnormalities.  MRI and MRA of the head and neck demonstrated no acute findings.  She was treated with aspirin and Plavix.  After 21 days it was recommended that she switch to Plavix alone.  She was treated for UTI.  2D echocardiogram is pending at the time of discharge.  However, it does not appear that this was done.   The patient had a follow up echo with no significant abnormalities.    She continues to have increasing shortness of breath.  She says she is short of breath walking less than 50 feet on level ground.  She has to stop several minutes to recover.  She is not describing PND or orthopnea.  She is not describing chest pressure, neck or arm discomfort.  She does not have any new palpitations, presyncope or syncope.  She is having trouble with her blood sugar not being well-controlled.  She is having problems with her hands turning white she says at times.   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   age 89--  pt states was quarantined   no treatment ;  per pt no symptoms or issues since   History of kidney stones    History of TIA (transient ischemic attack)    03-23-2014  w/ episode temporay amnesia   Hyperlipidemia    Hypertension    Hypothyroidism    Iron deficiency anemia    LAFB (left anterior fascicular block)    OA (osteoarthritis)    OSA on CPAP    severe per study 2003   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   Post-polio limb muscle weakness    left leg  w/ 3 repair surgery's   RBBB (right bundle branch block)    Seasonal allergies    TIA (transient ischemic attack)    2023   Type 2 diabetes mellitus ([place])    Vitamin D deficiency     Past Surgical History:  Procedure Laterality Date   BACK SURGERY     CARPAL TUNNEL RELEASE Right 05/10/2007   CHOLECYSTECTOMY OPEN  05/11/1983   and Appendectomy   CYSTO/  RIGHT  URETEROSCOPIC STONE EXTRACTON/  STENT PLACEMENT  06/ 2016   in [place]   CYSTOSCOPY/RETROGRADE/URETEROSCOPY/STONE EXTRACTION WITH BASKET Right 11/17/2015   Procedure: CYSTOSCOPY/RETROGRADE PYELOGRAM RIGHT/DIAGNOSTIC RIGHT URETEROSCOPY/LASER RENAL CALCIFICATION/RIGHT STENT PLACEMENT;  Surgeon: Cleon Gustin, MD;  Location: [facility];  Service: Urology;  Laterality: Right;   ESOPHAGOGASTRODUODENOSCOPY N/A 08/29/2015   Procedure: ESOPHAGOGASTRODUODENOSCOPY (EGD);  Surgeon: Danie Binder, MD;  [facility];  Service: Endoscopy;  Laterality: N/A;   HOLMIUM LASER APPLICATION Right [id number]   Procedure: HOLMIUM LASER APPLICATION;  Surgeon: Cleon Gustin, MD;  Location: [facility];  Service: Urology;  Laterality: Right;   KNEE ARTHROSCOPY Right 05/10/1997   LEFT HEEL REPAIR SURGERY  1954;  1985;  [facility] [facility]  09/01/2010   L4 -L5 fusion,  L3  laminectomy,  L5 - S1 foraminotomy   OVARIAN CYST SURGERY Right 05/10/1964   PARATHYROIDECTOMY N/A 05/06/2015   Procedure: PARATHYROIDECTOMY;  Surgeon: Armandina Gemma, MD;  Location: [facility];  Service: General;  Laterality: N/A;   left superior   TONSILLECTOMY  as child   TOTAL ABDOMINAL HYSTERECTOMY W/ BILATERAL SALPINGOOPHORECTOMY  05/10/1982   TOTAL KNEE ARTHROPLASTY Right 04/08/2008   TRANSTHORACIC ECHOCARDIOGRAM  04/07/2012   grade 1 diastolic function,  ef [id number]  mild AV calcification without stenosis/  trivial MR     Current Outpatient Medications  Medication Sig Dispense Refill   acetaminophen (TYLENOL) 650 MG CR tablet Take 650 mg by mouth in the morning.     albuterol (VENTOLIN HFA) 108 (90 Base) MCG/ACT inhaler Inhale 2 puffs into the lungs every 6 (six) hours as needed for wheezing or shortness of breath. 8 g 5   allopurinol (ZYLOPRIM) 100 MG tablet Take 1 tablet (100 mg total) by mouth daily. 90 tablet 3   carvedilol (COREG) 6.25 MG tablet Take 1 tablet (6.25 mg total) by mouth 2 (two) times daily with a meal. 180 tablet 3   Cholecalciferol (VITAMIN D3) 2000 UNITS capsule Take 2,000 Units by mouth 2 (two) times daily.      clobetasol ointment (TEMOVATE) [id number] % Apply 1 Application topically 2 (two) times daily. 60 g 3   clopidogrel (PLAVIX) 75 MG tablet Take 1 tablet (75 mg total) by mouth every evening. 90 tablet 3   clotrimazole-betamethasone (LOTRISONE) cream Apply 1 Application topically daily. 90 g 2   dapagliflozin propanediol (FARXIGA) 10 MG TABS tablet Take 1 tablet (10 mg total) by mouth daily before breakfast. 90 tablet 5   Elderberry 500 MG CAPS Take 500 mg by mouth daily.     empagliflozin (JARDIANCE) 25 MG TABS tablet Take 25 mg by mouth daily.     furosemide (LASIX) 40 MG tablet Take 1 tablet (40 mg total) by mouth daily. 90 tablet 3   L-FORMULA LYSINE HCL PO Take 1 Dose by mouth daily.     levothyroxine (SYNTHROID) 137 MCG tablet Take 1 tablet (137 mcg total) by  mouth daily before breakfast. 90 tablet 1   losartan (COZAAR) 25 MG tablet Take 1 tablet (25 mg total) by mouth daily. 90 tablet 3   metFORMIN (GLUCOPHAGE-XR) 500 MG 24 hr tablet Take 1 tablet (500 mg total) by mouth 2 (two) times daily with a meal. 60 tablet 1   nystatin (MYCOSTATIN/NYSTOP) powder Apply 1 Application topically 3 (three) times daily. 60 g 1   Omega-3 Fatty Acids (FISH OIL TRIPLE STRENGTH) 1400 MG CAPS Take 1,000 mg by mouth 4 (four) times daily.  pantoprazole (PROTONIX) 40 MG tablet Take 1 tablet (40 mg total) by mouth daily. 90 tablet 3   rosuvastatin (CRESTOR) 5 MG tablet Take 1 tablet (5 mg total) by mouth daily. 90 tablet 3   sitaGLIPtin (JANUVIA) 100 MG tablet Take 1 tablet (100 mg total) by mouth daily. 90 tablet 1   Blood Glucose Monitoring Suppl (BLOOD GLUCOSE MONITOR SYSTEM) w/Device KIT Use glucose meter to check blood sugar up to 3 times daily as instructed. One Touch Verio preferred by insurance. Dx: EE11.69 1 each 0   glucose blood (ONETOUCH VERIO) test strip USE 1 STRIP TO CHECK GLUCOSE UP TO THREE TIMES DAILY AS  INSTRUCTED 100 each 9   Lancets Misc. KIT Use patient & insurance preferred lancet device to test blood glucose up to 3 times daily for Dx: E11.69 100 each 12   No current facility-administered medications for this visit.    Allergies:   Codeine, Dilaudid [hydromorphone], Other, Hydrocodone-acetaminophen, and Doxycycline    ROS:  Please see the history of present illness.   Otherwise, review of systems are positive for none.   All other systems are reviewed and negative.    PHYSICAL EXAM: VS:  BP 102/78   Pulse 73   Ht '5\' 4"'$  (1.626 m)   Wt 235 lb (106.6 kg)   BMI 40.34 kg/m  , BMI Body mass index is 40.34 kg/m. GENERAL:  Well appearing NECK:  No jugular venous distention, waveform within normal limits, carotid upstroke brisk and symmetric, no bruits, no thyromegaly LUNGS:  Clear to auscultation bilaterally CHEST:  Unremarkable HEART:  PMI  not displaced or sustained,S1 and S2 within normal limits, no S3, no S4, no clicks, no rubs, no murmurs ABD:  Flat, positive bowel sounds normal in frequency in pitch, no bruits, no rebound, no guarding, no midline pulsatile mass, no hepatomegaly, no splenomegaly EXT:  2 plus pulses throughout, no edema, no cyanosis no clubbing    EKG:  EKG is  ordered today. The ekg ordered today demonstrates sinus rhythm with short PR interval, rate 73, right bundle branch block, left anterior fascicular block.  Premature Contractions   Recent Labs: 06/23/2022: ALT 22; BUN 10; Creatinine, Ser 0.74; Hemoglobin 12.7; Platelets 262; Potassium 3.7; Sodium 142; TSH 0.791    Lipid Panel    Component Value Date/Time   CHOL 124 06/23/2022 0824   CHOL 129 10/03/2012 1310   TRIG 243 (H) 06/23/2022 0824   TRIG 181 (H) 04/10/2013 1206   TRIG 264 (H) 10/03/2012 1310   HDL 31 (L) 06/23/2022 0824   HDL 39 (L) 04/10/2013 1206   HDL 29 (L) 10/03/2012 1310   CHOLHDL 4.0 06/23/2022 0824   CHOLHDL 4.7 06/15/2021 0523   VLDL 48 (H) 06/15/2021 0523   LDLCALC 54 06/23/2022 0824   LDLCALC 37 04/10/2013 1206   LDLCALC 47 10/03/2012 1310      Wt Readings from Last 3 Encounters:  07/21/22 235 lb (106.6 kg)  06/23/22 240 lb (108.9 kg)  06/16/22 239 lb 9.6 oz (108.7 kg)      Other studies Reviewed: Additional studies/ records that were reviewed today include:  Labs Review of the above records demonstrates:   See elsewhere   ASSESSMENT AND PLAN:  SOB:   I cannot exclude this as an anginal equivalent.  She needs screening for coronary artery disease but would not be able to walk on a treadmill.  I will order PET scanning  TIA: Echocardiogram was unremarkable.  She is being managed on Plavix  per neurology.  No further workup.  ABNORMAL EKG: She had no significant arrhythmias on the monitor.  She has no symptoms related to bradycardia arrhythmia.  No further workup.  SLEEP APNEA:   She does use CPAP.       HAND TINGLING: I do not think this is a vascular problem.  Blood pressure was equal in both arms.  No further vascular testing.   Current medicines are reviewed at length with the patient today.  The patient does not have concerns regarding medicines.  The following changes have been made:  None  Labs/ tests ordered today include:    Orders Placed This Encounter  Procedures   NM PET CT CARDIAC PERFUSION MULTI W/ABSOLUTE BLOODFLOW   EKG 12-Lead     Disposition:   FU with me in 12 months.     Signed, Minus Breeding, MD  07/21/2022 2:27 PM    [facility]

## 2022-07-21 ENCOUNTER — Ambulatory Visit (INDEPENDENT_AMBULATORY_CARE_PROVIDER_SITE_OTHER): Payer: PPO | Admitting: Cardiology

## 2022-07-21 ENCOUNTER — Encounter: Payer: Self-pay | Admitting: Cardiology

## 2022-07-21 VITALS — BP 102/78 | HR 73 | Ht 64.0 in | Wt 235.0 lb

## 2022-07-21 DIAGNOSIS — G4730 Sleep apnea, unspecified: Secondary | ICD-10-CM

## 2022-07-21 DIAGNOSIS — R0609 Other forms of dyspnea: Secondary | ICD-10-CM | POA: Diagnosis not present

## 2022-07-21 DIAGNOSIS — G459 Transient cerebral ischemic attack, unspecified: Secondary | ICD-10-CM | POA: Diagnosis not present

## 2022-07-21 DIAGNOSIS — R9431 Abnormal electrocardiogram [ECG] [EKG]: Secondary | ICD-10-CM | POA: Diagnosis not present

## 2022-07-21 NOTE — Patient Instructions (Signed)
Medication Instructions:  The current medical regimen is effective;  continue present plan and medications.  *If you need a refill on your cardiac medications before your next appointment, please call your pharmacy*   Lab Work: None today If you have labs (blood work) drawn today and your tests are completely normal, you will receive your results only by: [facility] (if you have MyChart) OR A paper copy in the mail If you have any lab test that is abnormal or we need to change your treatment, we will call you to review the results.   Testing/Procedures: How to Prepare for Your Cardiac PET/CT Stress Test:  1. Please do not take these medications before your test:   Medications that may interfere with the cardiac pharmacological stress agent (ex. nitrates - including erectile dysfunction medications, isosorbide mononitrate, tamulosin or beta-blockers) the day of the exam. (Erectile dysfunction medication should be held for at least 72 hrs prior to test) Theophylline containing medications for 12 hours. Dipyridamole 48 hours prior to the test. Your remaining medications may be taken with water.  2. Nothing to eat or drink, except water, 3 hours prior to arrival time.   NO caffeine/decaffeinated products, or chocolate 12 hours prior to arrival.  3. NO perfume, cologne or lotion  4. Total time is 1 to 2 hours; you may want to bring reading material for the waiting time.  5. Please report to Radiology at the [facility] Main Entrance 30 minutes early for your test.  [place], [place] [address]  Diabetic Preparation:  Hold oral medications. You may take NPH and Lantus insulin. Do not take Humalog or Humulin R (Regular Insulin) the day of your test. Check blood sugars prior to leaving the house. If able to eat breakfast prior to 3 hour fasting, you may take all medications, including your insulin, Do not worry if you miss your breakfast dose of insulin  - start at your next meal.  IF YOU THINK YOU MAY BE PREGNANT, OR ARE NURSING PLEASE INFORM THE TECHNOLOGIST.  In preparation for your appointment, medication and supplies will be purchased.  Appointment availability is limited, so if you need to cancel or reschedule, please call the Radiology Department at [phone]  24 hours in advance to avoid a cancellation fee of $100.00  What to Expect After you Arrive:  Once you arrive and check in for your appointment, you will be taken to a preparation room within the Radiology Department.  A technologist or Nurse will obtain your medical history, verify that you are correctly prepped for the exam, and explain the procedure.  Afterwards,  an IV will be started in your arm and electrodes will be placed on your skin for EKG monitoring during the stress portion of the exam. Then you will be escorted to the PET/CT scanner.  There, staff will get you positioned on the scanner and obtain a blood pressure and EKG.  During the exam, you will continue to be connected to the EKG and blood pressure machines.  A small, safe amount of a radioactive tracer will be injected in your IV to obtain a series of pictures of your heart along with an injection of a stress agent.    After your Exam:  It is recommended that you eat a meal and drink a caffeinated beverage to counter act any effects of the stress agent.  Drink plenty of fluids for the remainder of the day and urinate frequently for the first couple  of hours after the exam.  Your doctor will inform you of your test results within 7-10 business days.  For questions about your test or how to prepare for your test, please call: Marchia Bond, Cardiac Imaging Nurse Navigator  Gordy Clement, Cardiac Imaging Nurse Navigator Office: [phone]    Follow-Up: At [facility], you and your health needs are our priority.  As part of our continuing mission to provide you with exceptional heart care, we have  created designated Provider Care Teams.  These Care Teams include your primary Cardiologist (physician) and Advanced Practice Providers (APPs -  Physician Assistants and Nurse Practitioners) who all work together to provide you with the care you need, when you need it.  We recommend signing up for the patient portal called "MyChart".  Sign up information is provided on this After Visit Summary.  MyChart is used to connect with patients for Virtual Visits (Telemedicine).  Patients are able to view lab/test results, encounter notes, upcoming appointments, etc.  Non-urgent messages can be sent to your provider as well.   To learn more about what you can do with MyChart, go to [email or web address].    Your next appointment:   1 year(s)  Provider:   Minus Breeding, MD

## 2022-07-27 ENCOUNTER — Telehealth: Payer: PPO

## 2022-07-29 ENCOUNTER — Telehealth: Payer: Self-pay | Admitting: Pharmacist

## 2022-07-29 ENCOUNTER — Ambulatory Visit (INDEPENDENT_AMBULATORY_CARE_PROVIDER_SITE_OTHER): Payer: PPO | Admitting: Pharmacist

## 2022-07-29 DIAGNOSIS — E1140 Type 2 diabetes mellitus with diabetic neuropathy, unspecified: Secondary | ICD-10-CM

## 2022-07-29 NOTE — Telephone Encounter (Signed)
S:    T2DM   82 y.o. female who presents for diabetes evaluation, education, and management.  PMH is significant for T2DM, HTN, HLD, OSA, GERD, and history of TIA.  Patient was last seen by Primary Care Provider, Dr. Warrick Parisian, on 06/23/2022.    I connected with  Karen Potter on 07/06/22 by a telephone enabled telemedicine application and verified that I am speaking with the correct person using two identifiers.   I discussed the limitations of evaluation and management by telemedicine. The patient expressed understanding and agreed to proceed.   PharmD is located in PCP office and Patient is at home during the time of this call.   Today, patient states she has tolerated metformin well and denies any significant changed in her bowel habits. Reports Jardiance copay was $94 and told the pharmacy she would not pick it up if it costs that much. She has been using Jardiance samples provided by Dr. Warrick Parisian. Blood sugars have improved since last week. Patient states she has not applied for Medicare LIS.    Patient reports Diabetes was diagnosed 'a while ago'. She states she has been off medications for 2-3 years.    Current diabetes medications include: Januvia 100 mg daily, metformin 500 mg daily, Jardiance 25 mg daily  Previous diabetes medications: glimepiride (DC'd due to symptomatic hypoglycemia), metformin IR (DC'd due to diarrhea)    Patient reports adherence to taking all medications as prescribed.    Patient denies hypoglycemic events. Reported home fasting blood sugars: 140   Patient denies nocturia (nighttime urination).  Patient reports neuropathy (nerve pain). Patient denies visual changes.   Patient reported dietary habits: Eats 2 meals/day (breakfast and dinner) Breakfast: bacon, eggs, tomatoes, grits Dinner: tortellini soup last night, typically meat and veggies Beverage: Coke (2 day) >> reports BG is high even when she drinks only water   O:  Recent Labs        Lab Results  Component Value Date    HGBA1C 9.5 (H) 06/23/2022      Lipid Panel  Labs (Brief)          Component Value Date/Time    CHOL 124 06/23/2022 0824    CHOL 129 10/03/2012 1310    TRIG 243 (H) 06/23/2022 0824    TRIG 181 (H) 04/10/2013 1206    TRIG 264 (H) 10/03/2012 1310    HDL 31 (L) 06/23/2022 0824    HDL 39 (L) 04/10/2013 1206    HDL 29 (L) 10/03/2012 1310    CHOLHDL 4.0 06/23/2022 0824    CHOLHDL 4.7 06/15/2021 0523    VLDL 48 (H) 06/15/2021 0523    LDLCALC 54 06/23/2022 0824    LDLCALC 37 04/10/2013 1206    LDLCALC 47 10/03/2012 1310        Clinical Atherosclerotic Cardiovascular Disease (ASCVD): Yes - h/o TIA The ASCVD Risk score (Arnett DK, et al., 2019) failed to calculate for the following reasons:   The 2019 ASCVD risk score is only valid for ages 50 to 21    A/P: Diabetes longstanding, currently above goal based on A1c (9.5%). Patient is able to verbalize appropriate hypoglycemia management plan. Medication adherence appears appropriate. Patient has many concerns about the costs of her medications.  -Continue Januvia 100 mg daily.  -Continue Jardiance 25 mg daily.  -Continue metformin XR 500 mg daily. Patient can increase to 1 tablet BID if tolerated.  -Sent patient information on LIS enrollment.  -Patient educated on purpose, proper  use, and potential adverse effects of Januvia, metformin.  -Extensively discussed pathophysiology of diabetes, recommended lifestyle interventions, dietary effects on blood sugar control.  -Counseled on s/sx of and management of hypoglycemia.  -Next A1c anticipated May 2024.    Total time counseling 30 minutes.     Follow-up:  Pharmacist PRN. PCP clinic visit May 2024.   Joseph Art, Pharm.D. PGY-2 Ambulatory Care Pharmacy Resident   Regina Eck, PharmD, BCACP Clinical Pharmacist, [facility]

## 2022-08-03 ENCOUNTER — Telehealth (HOSPITAL_COMMUNITY): Payer: Self-pay | Admitting: Emergency Medicine

## 2022-08-03 NOTE — Telephone Encounter (Signed)
Reaching out to patient to offer assistance regarding upcoming cardiac imaging study; pt verbalizes understanding of appt date/time, parking situation and where to check in, pre-test NPO status and medications ordered, and verified current allergies; name and call back number provided for further questions should they arise Marchia Bond RN Navigator Cardiac Imaging [facility] [phone] office [phone] cell  Arrival [address] [facility] [address], radiology No food No caffeine Denies iv issues Holding daily meds

## 2022-08-04 ENCOUNTER — Ambulatory Visit (INDEPENDENT_AMBULATORY_CARE_PROVIDER_SITE_OTHER): Payer: PPO | Admitting: *Deleted

## 2022-08-04 ENCOUNTER — Ambulatory Visit (HOSPITAL_COMMUNITY)
Admission: RE | Admit: 2022-08-04 | Discharge: 2022-08-04 | Disposition: A | Discharge status: Home / Self Care | Payer: PPO | Source: Ambulatory Visit | Attending: Cardiology | Admitting: Cardiology

## 2022-08-04 DIAGNOSIS — R9431 Abnormal electrocardiogram [ECG] [EKG]: Secondary | ICD-10-CM | POA: Diagnosis not present

## 2022-08-04 DIAGNOSIS — E1159 Type 2 diabetes mellitus with other circulatory complications: Secondary | ICD-10-CM

## 2022-08-04 DIAGNOSIS — R0609 Other forms of dyspnea: Secondary | ICD-10-CM | POA: Insufficient documentation

## 2022-08-04 DIAGNOSIS — E1140 Type 2 diabetes mellitus with diabetic neuropathy, unspecified: Secondary | ICD-10-CM

## 2022-08-04 LAB — NM PET CT CARDIAC PERFUSION MULTI W/ABSOLUTE BLOODFLOW
MBFR: 2.2
Nuc Rest EF: 66 %
Nuc Stress EF: 71 %
Rest MBF: 1.16 ml/g/min
Rest Nuclear Isotope Dose: 26.7 mCi
ST Depression (mm): 0 mm
Stress MBF: 2.55 ml/g/min
Stress Nuclear Isotope Dose: 26.7 mCi
TID: 1.02

## 2022-08-04 MED ORDER — RUBIDIUM RB82 GENERATOR (RUBYFILL)
26.7000 | PACK | Freq: Once | INTRAVENOUS | Status: AC
  Administered 2022-08-04: 26.7 via INTRAVENOUS

## 2022-08-04 MED ORDER — REGADENOSON 0.4 MG/5ML IV SOLN: 0.4000 mg | Freq: Once | INTRAVENOUS | Status: AC

## 2022-08-04 MED ORDER — REGADENOSON 0.4 MG/5ML IV SOLN
INTRAVENOUS | Status: AC
  Administered 2022-08-04: 0.4 mg via INTRAVENOUS
  Filled 2022-08-04: qty 5

## 2022-08-04 NOTE — Chronic Care Management (AMB) (Signed)
Chronic Care Management   CCM RN Visit Note  08/04/2022 Name: Karen Potter MRN: [id number] DOB: Dec 09, 1940  Subjective: Karen Potter is a 82 y.o. year old female who is a primary care patient of Dettinger, Fransisca Kaufmann, MD. The patient was referred to the Chronic Care Management team for assistance with care management needs subsequent to provider initiation of CCM services and plan of care.    Today's Visit:  Engaged with patient by telephone for follow up visit.        Goals Addressed             This Visit's Progress    CCM (DIABETES) EXPECTED OUTCOME: MONITOR, SELF-MANAGE AND REDUCE SYMPTOMS OF DIABETES       Current Barriers:  Knowledge Deficits related to Diabetes management Care Coordination needs related to medications (currently working with pharmacist) in a patient with Diabetes Chronic Disease Management support and education needs related to Diabetes, diet Patient reports she has been checking CBG fasting only- with fasting ranges 120-160 with today's reading 149, pt reports she has had recent medications changes and hopes this makes a difference, [facility] pharmacist has been working with patient, pt is no longer taking metformin, is taking [place] and jardiance, was denied extra help for medications Patient reports she drinks water and soft drinks  Patient does not participate in exercise at present  Planned Interventions: Reviewed medications with patient and discussed importance of medication adherence;        Counseled on importance of regular laboratory monitoring as prescribed;        Advised patient, providing education and rationale, to check cbg twice daily and record        call provider for findings outside established parameters;       Review of patient status, including review of consultants reports, relevant laboratory and other test results, and medications completed;       Advised patient to discuss any issues with blood sugar, medications with provider;       Reviewed all upcoming scheduled appointments Reinforced carbohydrate modified diet  Symptom Management: Take medications as prescribed   Attend all scheduled provider appointments Call pharmacy for medication refills 3-7 days in advance of running out of medications Attend church or other social activities Perform all self care activities independently  Perform IADL's (shopping, preparing meals, housekeeping, managing finances) independently Call provider office for new concerns or questions  check blood sugar at prescribed times: twice daily check feet daily for cuts, sores or redness enter blood sugar readings and medication or insulin into daily log take the blood sugar log to all doctor visits take the blood sugar meter to all doctor visits trim toenails straight across fill half of plate with vegetables limit fast food meals to no more than 1 per week manage portion size prepare main meal at home 3 to 5 days each week read food labels for fat, fiber, carbohydrates and portion size set a realistic goal keep feet up while sitting Try to do some type of exercise daily even if for a few minutes Continue working with pharmacist Try to slowly cut down on the soft drinks  Follow Up Plan: Telephone follow up appointment with care management team member scheduled for:  10/28/22 at 215 pm       CCM (HYPERTENSION) EXPECTED OUTCOME: MONITOR, SELF-MANAGE AND REDUCE SYMPTOMS OF HYPERTENSION       Current Barriers:  Knowledge Deficits related to Hypertension management Chronic Disease Management support and education needs related  to Hypertension, diet, exercise Patient reports she lives with spouse, is independent in all aspects of her care, continues to drive, does not exercise Patient reports she checks blood pressure on occasion and readings " are usually good" Patient reports she had PET scan/ cardiac imaging study today and will follow up with cardiologist  Planned  Interventions: Evaluation of current treatment plan related to hypertension self management and patient's adherence to plan as established by provider;   Reviewed medications with patient and discussed importance of compliance;  Counseled on the importance of exercise goals with target of 150 minutes per week Advised patient, providing education and rationale, to monitor blood pressure daily and record, calling PCP for findings outside established parameters;  Advised patient to discuss any issues with blood pressure, medications/ side effects with provider; Discussed complications of poorly controlled blood pressure such as heart disease, stroke, circulatory complications, vision complications, kidney impairment, sexual dysfunction;  Reinforced importance of adherence to low sodium diet  Symptom Management: Take medications as prescribed   Attend all scheduled provider appointments Call pharmacy for medication refills 3-7 days in advance of running out of medications Attend church or other social activities Perform all self care activities independently  Perform IADL's (shopping, preparing meals, housekeeping, managing finances) independently Call provider office for new concerns or questions  check blood pressure weekly choose a place to take my blood pressure (home, clinic or office, retail store) write blood pressure results in a log or diary learn about high blood pressure keep a blood pressure log call doctor for signs and symptoms of high blood pressure develop an action plan for high blood pressure keep all doctor appointments take medications for blood pressure exactly as prescribed begin an exercise program report new symptoms to your doctor eat more whole grains, fruits and vegetables, lean meats and healthy fats Follow low sodium diet- read food labels for sodium content Follow up with cardiologist on PET scan  Follow Up Plan: Telephone follow up appointment with care  management team member scheduled for:  10/28/22 at 215 pm          Plan:Telephone follow up appointment with care management team member scheduled for:  10/28/22 at 215 pm  Jacqlyn Larsen [facility], BSN RN Case Manager [place] [phone]

## 2022-08-04 NOTE — Patient Instructions (Signed)
Please call the care guide team at [phone] if you need to cancel or reschedule your appointment.   If you are experiencing a Mental Health or [place] or need someone to talk to, please call the Suicide and Crisis Lifeline: 988 call the [place] National Suicide Prevention Lifeline: 605-820-9486 or TTY: (669)136-8338 TTY 671 164 0273) to talk to a trained counselor call 1-800-273-TALK (toll free, 24 hour hotline) go to [facility] [address], [place] [phone]) call the [facility]: [phone] call 911   Following is a copy of the CCM Program Consent:  CCM service includes personalized support from designated clinical staff supervised by the physician, including individualized plan of care and coordination with other care providers 24/7 contact phone numbers for assistance for urgent and routine care needs. Service will only be billed when office clinical staff spend 20 minutes or more in a month to coordinate care. Only one practitioner may furnish and bill the service in a calendar month. The patient may stop CCM services at amy time (effective at the end of the month) by phone call to the office staff. The patient will be responsible for cost sharing (co-pay) or up to 20% of the service fee (after annual deductible is met)  Following is a copy of your full provider care plan:   Goals Addressed             This Visit's Progress    CCM (DIABETES) EXPECTED OUTCOME: MONITOR, SELF-MANAGE AND REDUCE SYMPTOMS OF DIABETES       Current Barriers:  Knowledge Deficits related to Diabetes management Care Coordination needs related to medications (currently working with pharmacist) in a patient with Diabetes Chronic Disease Management support and education needs related to Diabetes, diet Patient reports she has been checking CBG fasting only- with fasting ranges 120-160 with today's reading 149, pt reports she  has had recent medications changes and hopes this makes a difference, [facility] pharmacist has been working with patient, pt is no longer taking metformin, is taking [place] and jardiance, was denied extra help for medications Patient reports she drinks water and soft drinks  Patient does not participate in exercise at present  Planned Interventions: Reviewed medications with patient and discussed importance of medication adherence;        Counseled on importance of regular laboratory monitoring as prescribed;        Advised patient, providing education and rationale, to check cbg twice daily and record        call provider for findings outside established parameters;       Review of patient status, including review of consultants reports, relevant laboratory and other test results, and medications completed;       Advised patient to discuss any issues with blood sugar, medications with provider;      Reviewed all upcoming scheduled appointments Reinforced carbohydrate modified diet  Symptom Management: Take medications as prescribed   Attend all scheduled provider appointments Call pharmacy for medication refills 3-7 days in advance of running out of medications Attend church or other social activities Perform all self care activities independently  Perform IADL's (shopping, preparing meals, housekeeping, managing finances) independently Call provider office for new concerns or questions  check blood sugar at prescribed times: twice daily check feet daily for cuts, sores or redness enter blood sugar readings and medication or insulin into daily log take the blood sugar log to all doctor visits take the blood sugar meter to all doctor visits trim  toenails straight across fill half of plate with vegetables limit fast food meals to no more than 1 per week manage portion size prepare main meal at home 3 to 5 days each week read food labels for fat, fiber, carbohydrates and portion size set a  realistic goal keep feet up while sitting Try to do some type of exercise daily even if for a few minutes Continue working with pharmacist Try to slowly cut down on the soft drinks  Follow Up Plan: Telephone follow up appointment with care management team member scheduled for:  10/28/22 at 215 pm       CCM (HYPERTENSION) EXPECTED OUTCOME: MONITOR, SELF-MANAGE AND REDUCE SYMPTOMS OF HYPERTENSION       Current Barriers:  Knowledge Deficits related to Hypertension management Chronic Disease Management support and education needs related to Hypertension, diet, exercise Patient reports she lives with spouse, is independent in all aspects of her care, continues to drive, does not exercise Patient reports she checks blood pressure on occasion and readings " are usually good" Patient reports she had PET scan/ cardiac imaging study today and will follow up with cardiologist  Planned Interventions: Evaluation of current treatment plan related to hypertension self management and patient's adherence to plan as established by provider;   Reviewed medications with patient and discussed importance of compliance;  Counseled on the importance of exercise goals with target of 150 minutes per week Advised patient, providing education and rationale, to monitor blood pressure daily and record, calling PCP for findings outside established parameters;  Advised patient to discuss any issues with blood pressure, medications/ side effects with provider; Discussed complications of poorly controlled blood pressure such as heart disease, stroke, circulatory complications, vision complications, kidney impairment, sexual dysfunction;  Reinforced importance of adherence to low sodium diet  Symptom Management: Take medications as prescribed   Attend all scheduled provider appointments Call pharmacy for medication refills 3-7 days in advance of running out of medications Attend church or other social activities Perform  all self care activities independently  Perform IADL's (shopping, preparing meals, housekeeping, managing finances) independently Call provider office for new concerns or questions  check blood pressure weekly choose a place to take my blood pressure (home, clinic or office, retail store) write blood pressure results in a log or diary learn about high blood pressure keep a blood pressure log call doctor for signs and symptoms of high blood pressure develop an action plan for high blood pressure keep all doctor appointments take medications for blood pressure exactly as prescribed begin an exercise program report new symptoms to your doctor eat more whole grains, fruits and vegetables, lean meats and healthy fats Follow low sodium diet- read food labels for sodium content Follow up with cardiologist on PET scan  Follow Up Plan: Telephone follow up appointment with care management team member scheduled for:  10/28/22 at 215 pm          Patient verbalizes understanding of instructions and care plan provided today and agrees to view in [place]. Active MyChart status and patient understanding of how to access instructions and care plan via MyChart confirmed with patient.     Telephone follow up appointment with care management team member scheduled for:  10/28/22 at 215 pm

## 2022-08-08 DIAGNOSIS — I10 Essential (primary) hypertension: Secondary | ICD-10-CM

## 2022-08-08 DIAGNOSIS — E1159 Type 2 diabetes mellitus with other circulatory complications: Secondary | ICD-10-CM | POA: Diagnosis not present

## 2022-08-11 ENCOUNTER — Telehealth: Payer: Self-pay | Admitting: Pharmacist

## 2022-08-11 NOTE — Telephone Encounter (Signed)
Can you send me PAPs for Rybelsus 7mg  daily and Farxiga 10mg  daily? We are cancelling Januvia since they have not even received app. I will update med list once transition occurs.  Will also escribe farxiga to [facility] when I do PAP  Thanks!

## 2022-08-11 NOTE — Progress Notes (Signed)
07/29/2022 Name: Karen Potter MRN: [id number] DOB: [birth year]  Diabetes   Karen Potter is a 82 y.o. year old female who presented for a telephone visit.   They were referred to the pharmacist by their PCP for assistance in managing diabetes and medication access. Patient can no longer afford Januvia and the patient assistance program has not responded after months of trying.  She is here today to discover new options for her diabetes regimen.   Subjective:  Care Team: Primary Care Provider: Dettinger, Fransisca Kaufmann, MD ; Next Scheduled Visit: 09/2022  Medication Access/Adherence  Current Pharmacy:  [facility] [address] [place]  [address] Phone: [phone] Fax: [phone]  [facility] ([place], [address] [address] Phone: [phone] Fax: [phone]  [place], [address] [place] [address] Phone: [phone] Fax: [phone]  [id number] - [place], [place] [address] Phone: [phone] Fax: [place], [address] [place] [address] Phone: [phone] Fax: [phone]   Patient reports affordability concerns with their medications: Yes  Patient reports access/transportation concerns to their pharmacy: No  Patient reports adherence concerns with their medications:  No    Objective:  Lab Results  Component Value Date   HGBA1C 9.5 (H) 06/23/2022    Lab Results  Component Value Date   CREATININE 0.74 06/23/2022   BUN 10 06/23/2022   NA 142 06/23/2022   K 3.7 06/23/2022   CL 102 06/23/2022   CO2 24 06/23/2022    Lab Results  Component Value Date   CHOL 124 06/23/2022   HDL 31 (L) 06/23/2022   LDLCALC 54  06/23/2022   TRIG 243 (H) 06/23/2022   CHOLHDL 4.0 06/23/2022    Medications Reviewed Today     Reviewed by Kassie Mends, RN (Registered Nurse) on 08/04/22 at 60  Med List Status: <None>   Medication Order Taking? Sig Documenting Provider Last Dose Status Informant  acetaminophen (TYLENOL) 650 MG CR tablet [id number] Yes Take 650 mg by mouth in the morning. [provider] Taking Active Self  albuterol (VENTOLIN HFA) 108 (90 Base) MCG/ACT inhaler [id number] Yes Inhale 2 puffs into the lungs every 6 (six) hours as needed for wheezing or shortness of breath. Dettinger, Fransisca Kaufmann, MD Taking Active   allopurinol (ZYLOPRIM) 100 MG tablet [id number] Yes Take 1 tablet (100 mg total) by mouth daily. Dettinger, Fransisca Kaufmann, MD Taking Active   Blood Glucose Monitoring Suppl (BLOOD GLUCOSE MONITOR SYSTEM) w/Device KIT [id number] Yes Use glucose meter to check blood sugar up to 3 times daily as instructed. One Touch Verio preferred by insurance. 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 Dettinger, Fransisca Kaufmann, MD Taking Active   Cholecalciferol (VITAMIN D3) 2000 UNITS capsule [id number] Yes Take 2,000 Units by mouth 2 (two) times daily.  [provider] Taking Active Self  clobetasol ointment (TEMOVATE) 0.05 % 0000000 Yes Apply 1 Application topically 2 (two) times daily. Dettinger, Fransisca Kaufmann, MD Taking Active   clopidogrel (PLAVIX) 75 MG tablet [id number]  Yes Take 1 tablet (75 mg total) by mouth every evening. Dettinger, Fransisca Kaufmann, MD Taking Active   clotrimazole-betamethasone Donalynn Furlong) cream [id number] Yes Apply 1 Application topically daily. Dettinger, Fransisca Kaufmann, MD Taking Active   dapagliflozin propanediol (FARXIGA) 10 MG TABS tablet [id number] No Take 1 tablet (10 mg total) by mouth daily before breakfast.  Patient not taking: Reported on 08/04/2022   Dettinger,  Fransisca Kaufmann, MD Not Taking Active   Elderberry 500 MG CAPS [id number] Yes Take 500 mg by mouth daily. [provider] Taking Active Self           Med Note Clydell Hakim   Mon Jun 15, 2021  7:56 AM)    empagliflozin (JARDIANCE) 25 MG TABS tablet [id number] Yes Take 25 mg by mouth daily. [provider] Taking Active   furosemide (LASIX) 40 MG tablet [id number] Yes Take 1 tablet (40 mg total) by mouth daily. Dettinger, Fransisca Kaufmann, MD Taking Active   glucose blood [facility] VERIO) test strip [id number] Yes USE 1 STRIP TO CHECK GLUCOSE UP TO THREE TIMES DAILY AS  INSTRUCTED Dettinger, Fransisca Kaufmann, MD Taking Active   L-FORMULA LYSINE HCL PO [id number] Yes Take 1 Dose by mouth daily. [provider] Taking Active   Lancets Misc. KIT [id number] Yes Use patient & insurance preferred lancet device to test blood glucose up to 3 times daily for Dx: E11.69 Dettinger, Fransisca Kaufmann, MD Taking Active Self  levothyroxine (SYNTHROID) 137 MCG tablet [id number] Yes Take 1 tablet (137 mcg total) by mouth daily before breakfast. Dettinger, Fransisca Kaufmann, MD Taking Active   losartan (COZAAR) 25 MG tablet [id number] Yes Take 1 tablet (25 mg total) by mouth daily. Dettinger, Fransisca Kaufmann, MD Taking Active   nystatin (MYCOSTATIN/NYSTOP) powder [id number] Yes Apply 1 Application topically 3 (three) times daily. Dettinger, Fransisca Kaufmann, MD Taking Active   Omega-3 Fatty Acids (FISH OIL TRIPLE STRENGTH) 1400 MG CAPS [id number] Yes Take 1,000 mg by mouth 4 (four) times daily.  [provider] Taking Active Self           Med Note Michaela Corner   Fri May 06, 2017 10:50 AM) 1430 EPA, 570 DHA  pantoprazole (PROTONIX) 40 MG tablet [id number] Yes Take 1 tablet (40 mg total) by mouth daily. Dettinger, Fransisca Kaufmann, MD Taking Active   rosuvastatin (CRESTOR) 5 MG tablet [id number] Yes Take 1 tablet (5 mg total) by mouth daily. Dettinger, Fransisca Kaufmann, MD Taking Active   sitaGLIPtin (JANUVIA) 100 MG tablet [id number] Yes Take 1 tablet  (100 mg total) by mouth daily. Dettinger, Fransisca Kaufmann, MD Taking Active               Assessment/Plan:   A/P: Diabetes longstanding, currently above goal based on A1c (9.5%). Patient is able to verbalize appropriate hypoglycemia management plan. Medication adherence appears appropriate. Patient has many concerns about the costs of her medications.  -Continue Januvia 100 mg daily. -->will transition to Rybelsus for ease of PAP process and better glycemic control needed -Continue Jardiance 25 mg daily. -->will transition to [place] as we are able to get through PAP -Continue metformin XR 500 mg daily. Patient can increase to 1 tablet BID if tolerated.  Will update med list once transition occurs  -LIS/extra help was denied -Patient educated on purpose, proper use, and potential adverse effects -Extensively discussed pathophysiology of diabetes, recommended lifestyle interventions, dietary effects on blood sugar control.  -Counseled on s/sx of and management of hypoglycemia.  -Next A1c anticipated May 2024.  Total time counseling 30 minutes.      Regina Eck, PharmD, [place] Clinical Pharmacist, [facility]

## 2022-08-19 ENCOUNTER — Ambulatory Visit (INDEPENDENT_AMBULATORY_CARE_PROVIDER_SITE_OTHER): Payer: PPO | Admitting: Pharmacist

## 2022-08-19 ENCOUNTER — Encounter: Payer: Self-pay | Admitting: Pharmacist

## 2022-08-19 DIAGNOSIS — E1140 Type 2 diabetes mellitus with diabetic neuropathy, unspecified: Secondary | ICD-10-CM

## 2022-08-19 NOTE — Telephone Encounter (Signed)
I'm pretty sure I emailed both apps back to you if you could confirm! Thanks!

## 2022-08-19 NOTE — Progress Notes (Signed)
08/19/2022 Name: Karen Potter       MRN: [id number]       DOB: 1941-02-27   Diabetes     Karen Potter is a 82 y.o. year old female who presented for a telephone visit.   They were referred to the pharmacist by their PCP for assistance in managing diabetes and medication access. Patient can no longer afford Januvia and the patient assistance program has not responded after months of trying.  We are working to optimize her diabetes regimen while exploring patient assistance programs.     Subjective:   Care Team: Primary Care Provider: Dettinger, Elige Radon, MD ; Next Scheduled Visit: 09/2022   Medication Access/Adherence   Current Pharmacy:  [facility] [address], [place] - [address] [place] [address] Phone: [phone] Fax: [phone]   [facility] - [place], [place] - [address] [facility] [address] [place] [address] [place] [address] Phone: [phone] Fax: [phone]   [facility] - [place], [place] - [address] [place] [address] Phone: [phone] Fax: [phone]   [id number] - [place],  - [address] [place] [address] Phone: [phone] Fax: [phone]   [facility] - [place], [place] - [address] [place] [address] [place] [address] Phone: [phone] Fax: [phone]     Patient reports affordability concerns with their medications: Yes  Patient reports access/transportation concerns to their pharmacy: No  Patient reports adherence concerns with their medications:  No     Objective:   Recent Labs       Lab Results  Component Value Date    HGBA1C 9.5 (H) 06/23/2022        Recent Labs       Lab Results  Component Value Date    CREATININE 0.74 06/23/2022    BUN 10 06/23/2022    NA 142 06/23/2022    K 3.7 06/23/2022    CL 102 06/23/2022    CO2 24  06/23/2022        Recent Labs       Lab Results  Component Value Date    CHOL 124 06/23/2022    HDL 31 (L) 06/23/2022    LDLCALC 54 06/23/2022    TRIG 243 (H) 06/23/2022    CHOLHDL 4.0 06/23/2022        Medications Reviewed Today       Reviewed by Audrie Gallus, RN (Registered Nurse) on 08/04/22 at 1428  Med List Status: <None>    Medication Order Taking? Sig Documenting Provider Last Dose Status Informant  acetaminophen (TYLENOL) 650 MG CR tablet [id number] Yes Take 650 mg by mouth in the morning. [provider] Taking Active Self  albuterol (VENTOLIN HFA) 108 (90 Base) MCG/ACT inhaler [id number] Yes Inhale 2 puffs into the lungs every 6 (six) hours as needed for wheezing or shortness of breath. Dettinger, Elige Radon, MD Taking Active    allopurinol (ZYLOPRIM) 100 MG tablet [id number] Yes Take 1 tablet (100 mg total) by mouth daily. Dettinger, Elige Radon, MD Taking Active    Blood Glucose Monitoring Suppl (BLOOD GLUCOSE MONITOR SYSTEM) w/Device KIT [id number] Yes Use glucose meter to check blood sugar up to 3 times daily as instructed. One Touch Verio preferred by insurance. Dx: EE11.69 Dettinger, Elige Radon, MD Taking Active Self  carvedilol (COREG) 6.25 MG tablet [id number] Yes Take 1 tablet (6.25 mg total) by mouth 2 (two) times daily with a meal. Dettinger, Elige Radon, MD Taking Active    Cholecalciferol (VITAMIN D3) 2000 UNITS capsule [id number] Yes Take 2,000 Units by mouth 2 (two) times daily.  [provider] Taking Active Self  clobetasol ointment (TEMOVATE) 0.05 % [id number] Yes Apply 1 Application topically 2 (two) times daily. Dettinger, Elige Radon, MD Taking Active    clopidogrel (PLAVIX) 75 MG tablet [id number] Yes Take 1 tablet (75 mg total) by mouth every evening. Dettinger, Elige Radon, MD Taking Active    clotrimazole-betamethasone Thurmond Butts) cream [id number] Yes Apply 1 Application topically daily. Dettinger, Elige Radon, MD Taking Active    dapagliflozin propanediol  (FARXIGA) 10 MG TABS tablet [id number] No Take 1 tablet (10 mg total) by mouth daily before breakfast.  Patient not taking: Reported on 08/04/2022   Dettinger, Elige Radon, MD Not Taking Active    Elderberry 500 MG CAPS [id number] Yes Take 500 mg by mouth daily. [provider] Taking Active Self                   Med Note Sharlene Dory   Mon Jun 15, 2021  7:56 AM)    empagliflozin (JARDIANCE) 25 MG TABS tablet [id number] Yes Take 25 mg by mouth daily. [provider] Taking Active    furosemide (LASIX) 40 MG tablet [id number] Yes Take 1 tablet (40 mg total) by mouth daily. Dettinger, Elige Radon, MD Taking Active    glucose blood [facility] VERIO) test strip [id number] Yes USE 1 STRIP TO CHECK GLUCOSE UP TO THREE TIMES DAILY AS  INSTRUCTED Dettinger, Elige Radon, MD Taking Active    L-FORMULA LYSINE HCL PO [id number] Yes Take 1 Dose by mouth daily. [provider] Taking Active    Lancets Misc. KIT [id number] Yes Use patient & insurance preferred lancet device to test blood glucose up to 3 times daily for Dx: E11.69 Dettinger, Elige Radon, MD Taking Active Self  levothyroxine (SYNTHROID) 137 MCG tablet [id number] Yes Take 1 tablet (137 mcg total) by mouth daily before breakfast. Dettinger, Elige Radon, MD Taking Active    losartan (COZAAR) 25 MG tablet [id number] Yes Take 1 tablet (25 mg total) by mouth daily. Dettinger, Elige Radon, MD Taking Active    nystatin (MYCOSTATIN/NYSTOP) powder [id number] Yes Apply 1 Application topically 3 (three) times daily. Dettinger, Elige Radon, MD Taking Active    Omega-3 Fatty Acids (FISH OIL TRIPLE STRENGTH) 1400 MG CAPS [id number] Yes Take 1,000 mg by mouth 4 (four) times daily.  [provider] Taking Active Self                   Med Note Quay Burow   Fri May 06, 2017 10:50 AM) 1430 EPA, 570 DHA  pantoprazole (PROTONIX) 40 MG tablet [id number] Yes Take 1 tablet (40 mg total) by mouth daily. Dettinger, Elige Radon, MD Taking Active    rosuvastatin  (CRESTOR) 5 MG tablet [id number] Yes Take 1 tablet (5 mg total) by mouth daily. Dettinger, Elige Radon, MD Taking Active    sitaGLIPtin (JANUVIA) 100 MG tablet [id number] Yes Take 1 tablet (100 mg total) by mouth daily. Dettinger, Elige Radon, MD Taking Active                        Assessment/Plan:    A/P: Diabetes longstanding, currently above goal based on A1c (9.5%). Patient is able to  verbalize appropriate hypoglycemia management plan. Medication adherence appears appropriate. Patient has many concerns about the costs of her medications. Denies personal and family history of Medullary thyroid cancer (MTC)  -Continue Januvia 100 mg daily. --> will transition to Rybelsus for ease of PAP process and better glycemic control needed -Continue Jardiance 25 mg daily. -->will transition to [place] as we are able to get through PAP -Continue metformin XR 500 mg daily. Patient can increase to 1 tablet BID if tolerated.  Will update med list once transition occurs  -Patient assistance applications submitted for Rybelsus (novo nordisk) & Farxiga ([place]) -- currently pending -LIS/extra help with Medicare was denied -Patient educated on purpose, proper use, and potential adverse effects -Extensively discussed pathophysiology of diabetes, recommended lifestyle interventions, dietary effects on blood sugar control.  -Counseled on s/sx of and management of hypoglycemia.  -Next A1c anticipated May 2024.           Kieth BrightlyJulie Dattero Enrika Aguado, PharmD, BCACP Clinical Pharmacist, [facility]

## 2022-08-20 ENCOUNTER — Telehealth: Payer: Self-pay | Admitting: Pharmacist

## 2022-08-20 NOTE — Telephone Encounter (Signed)
    08/20/2022 Name: Karen Potter MRN: [id number] DOB: August 11, 1940   Samples left up front for patient to pick up: -Rybelsus 3mg  daily in place of Januvia (no samples for Januvia) -Jardiance 25mg  daily   Patient assistance pending Patient verbalizes understanding   Kieth Brightly, PharmD, BCACP Clinical Pharmacist, [facility]

## 2022-08-25 DIAGNOSIS — R198 Other specified symptoms and signs involving the digestive system and abdomen: Secondary | ICD-10-CM | POA: Diagnosis not present

## 2022-08-25 DIAGNOSIS — Z86010 Personal history of colonic polyps: Secondary | ICD-10-CM | POA: Diagnosis not present

## 2022-08-25 NOTE — Telephone Encounter (Signed)
Submitted application for FARXIGA to [employer] for patient assistance.   Phone: [phone]  

## 2022-08-25 NOTE — Telephone Encounter (Signed)
Submitted application for RYBELSUS to [employer] for patient assistance.   Phone: [phone]  

## 2022-08-26 ENCOUNTER — Telehealth: Payer: Self-pay

## 2022-08-26 NOTE — Telephone Encounter (Signed)
-----   Message from Danella Maiers, [facility] sent at 08/25/2022  8:32 AM EDT ----- Can you call patient re: merck attestation form? She received in the mail.  They have to select the right boxes and sign and send back!   Thanks!

## 2022-08-26 NOTE — Telephone Encounter (Signed)
Returned patients phone call and assisted with attestation letter. Pt completed and will mail back to company.

## 2022-08-27 NOTE — Telephone Encounter (Signed)
Received notification from [employer] regarding approval for [facility]. Patient assistance approved from 08/25/22 to 05/10/23.  Phone: [phone]

## 2022-09-06 NOTE — Telephone Encounter (Signed)
Received notification from [employer] regarding approval for RYBELSUS. Patient assistance approved from 08/31/22 to 08/25/23.  MEDICATION WILL SHIP TO OFFICE  Phone: [phone]

## 2022-09-08 ENCOUNTER — Telehealth: Payer: Self-pay | Admitting: Family Medicine

## 2022-09-08 NOTE — Telephone Encounter (Signed)
Patient says she started taking new medicine Rybelsus, and is having a lot of gas and diarrhea. Needs advise on what to do.

## 2022-09-08 NOTE — Telephone Encounter (Signed)
Tell her that she can use Imodium over-the-counter as needed and that the gas and diarrhea should go away in a week or 2 with the new medicine.  She should also avoid carbohydrates because the more carbohydrates she eats the more of the symptoms that she will have.  If it does not go away or if it gets worse then please let me know

## 2022-09-08 NOTE — Telephone Encounter (Signed)
Pt has been informed and understood. She going to be more consistent with taking the Imodium. Will call back in one week if she does not improved.

## 2022-09-15 ENCOUNTER — Telehealth: Payer: Self-pay | Admitting: Family Medicine

## 2022-09-16 NOTE — Telephone Encounter (Signed)
Returned call to patient  She received #90 tablets of januvia via merck PAP Cancel rybelsus program -- she does not want to take this medication Patient received farxiga in the mail via az& me PAP --updated med list

## 2022-09-28 ENCOUNTER — Other Ambulatory Visit: Payer: Self-pay | Admitting: Family Medicine

## 2022-10-06 ENCOUNTER — Ambulatory Visit (INDEPENDENT_AMBULATORY_CARE_PROVIDER_SITE_OTHER): Payer: PPO | Admitting: Family Medicine

## 2022-10-06 ENCOUNTER — Encounter: Payer: Self-pay | Admitting: Family Medicine

## 2022-10-06 VITALS — BP 125/81 | HR 68 | Ht 64.0 in | Wt 230.0 lb

## 2022-10-06 DIAGNOSIS — E039 Hypothyroidism, unspecified: Secondary | ICD-10-CM

## 2022-10-06 DIAGNOSIS — Z7984 Long term (current) use of oral hypoglycemic drugs: Secondary | ICD-10-CM

## 2022-10-06 DIAGNOSIS — E1169 Type 2 diabetes mellitus with other specified complication: Secondary | ICD-10-CM | POA: Diagnosis not present

## 2022-10-06 DIAGNOSIS — E785 Hyperlipidemia, unspecified: Secondary | ICD-10-CM | POA: Diagnosis not present

## 2022-10-06 DIAGNOSIS — M1A0710 Idiopathic chronic gout, right ankle and foot, without tophus (tophi): Secondary | ICD-10-CM | POA: Diagnosis not present

## 2022-10-06 DIAGNOSIS — Z8673 Personal history of transient ischemic attack (TIA), and cerebral infarction without residual deficits: Secondary | ICD-10-CM | POA: Diagnosis not present

## 2022-10-06 DIAGNOSIS — I152 Hypertension secondary to endocrine disorders: Secondary | ICD-10-CM

## 2022-10-06 DIAGNOSIS — E1159 Type 2 diabetes mellitus with other circulatory complications: Secondary | ICD-10-CM

## 2022-10-06 DIAGNOSIS — E1140 Type 2 diabetes mellitus with diabetic neuropathy, unspecified: Secondary | ICD-10-CM

## 2022-10-06 LAB — BAYER DCA HB A1C WAIVED: HB A1C (BAYER DCA - WAIVED): 6.6 % — ABNORMAL HIGH (ref 4.8–5.6)

## 2022-10-06 MED ORDER — LOSARTAN POTASSIUM 25 MG PO TABS: 25.0000 mg | ORAL_TABLET | Freq: Every day | ORAL | 3 refills | Status: DC

## 2022-10-06 MED ORDER — ALLOPURINOL 100 MG PO TABS: 100.0000 mg | ORAL_TABLET | Freq: Every day | ORAL | 3 refills | Status: DC

## 2022-10-06 MED ORDER — CLOPIDOGREL BISULFATE 75 MG PO TABS: 75.0000 mg | ORAL_TABLET | Freq: Every evening | ORAL | 3 refills | Status: DC

## 2022-10-06 MED ORDER — SITAGLIPTIN PHOSPHATE 100 MG PO TABS: 100.0000 mg | ORAL_TABLET | Freq: Every day | ORAL | 1 refills | Status: DC

## 2022-10-06 NOTE — Progress Notes (Signed)
BP 125/81   Pulse 68   Ht 5\' 4"  (1.626 m)   Wt 230 lb (104.3 kg)   SpO2 96%   BMI 39.48 kg/m    Subjective:   Patient ID: Karen Potter, female    DOB: 11-Jul-1940, 82 y.o.   MRN: [id number]  HPI: [name] is a 82 y.o. female presenting on 10/06/2022 for Medical Management of Chronic Issues and Diabetes   HPI Type 2 diabetes mellitus Patient comes in today for recheck of his diabetes. Patient has been currently taking [place] and [place]. Patient is currently on an ACE inhibitor/ARB. Patient has seen an ophthalmologist this year. Patient denies any new issues with their feet. The symptom started onset as an adult htn and hypothyroid hyperlipidemia ARE RELATED TO DM   Hypothyroidism recheck Patient is coming in for thyroid recheck today as well. They deny any issues with hair changes or heat or cold problems or diarrhea or constipation. They deny any chest pain or palpitations. They are currently on levothyroxine 137 micrograms  Hypertension Patient is currently on carvedilol and furosemide and losartan, and their blood pressure today is 125/81. Patient denies any lightheadedness or dizziness. Patient denies headaches, blurred vision, chest pains, shortness of breath, or weakness. Denies any side effects from medication and is content with current medication.   Hyperlipidemia Patient is coming in for recheck of his hyperlipidemia. The patient is currently taking Crestor and fish oils. They deny any issues with myalgias or history of liver damage from it. They deny any focal numbness or weakness or chest pain.   Relevant past medical, surgical, family and social history reviewed and updated as indicated. Interim medical history since our last visit reviewed. Allergies and medications reviewed and updated.  Review of Systems  Constitutional:  Negative for chills and fever.  HENT:  Negative for congestion, ear discharge and ear pain.   Eyes:  Negative for redness and visual  disturbance.  Respiratory:  Negative for chest tightness and shortness of breath.   Cardiovascular:  Negative for chest pain and leg swelling.  Genitourinary:  Negative for difficulty urinating and dysuria.  Musculoskeletal:  Negative for back pain and gait problem.  Skin:  Negative for rash.  Neurological:  Negative for light-headedness and headaches.  Psychiatric/Behavioral:  Negative for agitation and behavioral problems.   All other systems reviewed and are negative.   Per HPI unless specifically indicated above   Allergies as of 10/06/2022       Reactions   Codeine Itching   Dilaudid [hydromorphone] Other (See Comments)   Mouth blisters   Other Itching   Most pain meds cause itching.  When has to take pain medication, she has been instructed to take Benadryl   Hydrocodone-acetaminophen Other (See Comments)   Doxycycline Rash, Other (See Comments)        Medication List        Accurate as of Oct 06, 2022 10:48 AM. If you have any questions, ask your nurse or doctor.          acetaminophen 650 MG CR tablet Commonly known as: TYLENOL Take 650 mg by mouth in the morning.   albuterol 108 (90 Base) MCG/ACT inhaler Commonly known as: VENTOLIN HFA Inhale 2 puffs into the lungs every 6 (six) hours as needed for wheezing or shortness of breath.   allopurinol 100 MG tablet Commonly known as: ZYLOPRIM Take 1 tablet (100 mg total) by mouth daily.   Blood Glucose Monitor System w/Device Kit Use  glucose meter to check blood sugar up to 3 times daily as instructed. One Touch Verio preferred by insurance. 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   clobetasol ointment 0.05 % Commonly known as: TEMOVATE Apply 1 Application topically 2 (two) times daily.   clopidogrel 75 MG tablet Commonly known as: PLAVIX Take 1 tablet (75 mg total) by mouth every evening.    clotrimazole-betamethasone cream Commonly known as: LOTRISONE Apply 1 Application topically daily.   dapagliflozin propanediol 10 MG Tabs tablet Commonly known as: FARXIGA Take 1 tablet (10 mg total) by mouth daily before breakfast.   Elderberry 500 MG Caps Take 500 mg by mouth daily.   Fish Oil Triple Strength 1400 MG Caps Take 1,000 mg by mouth 4 (four) times daily.   furosemide 40 MG tablet Commonly known as: LASIX Take 1 tablet (40 mg total) by mouth daily.   L-FORMULA LYSINE HCL PO Take 1 Dose by mouth daily.   Lancets Misc. Kit Use patient & insurance preferred lancet device to test blood glucose up to 3 times daily for Dx: E11.69   levothyroxine 137 MCG tablet Commonly known as: SYNTHROID TAKE 1 TABLET BY MOUTH ONCE DAILY BEFORE BREAKFAST   losartan 25 MG tablet Commonly known as: COZAAR Take 1 tablet (25 mg total) by mouth daily.   nystatin powder Commonly known as: MYCOSTATIN/NYSTOP Apply 1 Application topically 3 (three) times daily.   OneTouch Verio test strip Generic drug: glucose blood USE 1 STRIP TO CHECK GLUCOSE UP TO THREE TIMES DAILY AS  INSTRUCTED   pantoprazole 40 MG tablet Commonly known as: Protonix Take 1 tablet (40 mg total) by mouth daily.   rosuvastatin 5 MG tablet Commonly known as: Crestor Take 1 tablet (5 mg total) by mouth daily.   sitaGLIPtin 100 MG tablet Commonly known as: Januvia Take 1 tablet (100 mg total) by mouth daily.   Vitamin D3 50 MCG (2000 UT) capsule Take 2,000 Units by mouth 2 (two) times daily.         Objective:   BP 125/81   Pulse 68   Ht 5\' 4"  (1.626 m)   Wt 230 lb (104.3 kg)   SpO2 96%   BMI 39.48 kg/m   Wt Readings from Last 3 Encounters:  10/06/22 230 lb (104.3 kg)  07/21/22 235 lb (106.6 kg)  06/23/22 240 lb (108.9 kg)    Physical Exam Vitals and nursing note reviewed.  Constitutional:      General: She is not in acute distress.    Appearance: She is well-developed. She is not  diaphoretic.  Eyes:     Conjunctiva/sclera: Conjunctivae normal.  Cardiovascular:     Rate and Rhythm: Normal rate and regular rhythm.     Heart sounds: Normal heart sounds. No murmur heard. Pulmonary:     Effort: Pulmonary effort is normal. No respiratory distress.     Breath sounds: Normal breath sounds. No wheezing.  Musculoskeletal:        General: No swelling or tenderness. Normal range of motion.  Skin:    General: Skin is warm and dry.     Findings: No rash.  Neurological:     Mental Status: She is alert and oriented to person, place, and time.     Coordination: Coordination normal.  Psychiatric:        Behavior: Behavior normal.       Assessment &  Plan:   Problem List Items Addressed This Visit       Cardiovascular and Mediastinum   Hypertension associated with diabetes (HCC)   Relevant Medications   sitaGLIPtin (JANUVIA) 100 MG tablet   losartan (COZAAR) 25 MG tablet     Endocrine   Hyperlipidemia associated with type 2 diabetes mellitus (HCC)   Relevant Medications   sitaGLIPtin (JANUVIA) 100 MG tablet   losartan (COZAAR) 25 MG tablet   Hypothyroid   Type 2 diabetes mellitus with other specified complication (HCC)   Relevant Medications   sitaGLIPtin (JANUVIA) 100 MG tablet   losartan (COZAAR) 25 MG tablet   Type 2 diabetes mellitus with diabetic neuropathy, without long-term current use of insulin (HCC) - Primary   Relevant Medications   sitaGLIPtin (JANUVIA) 100 MG tablet   losartan (COZAAR) 25 MG tablet   Other Relevant Orders   Bayer DCA Hb A1c Waived   Other Visit Diagnoses     History of TIA (transient ischemic attack)       Relevant Medications   clopidogrel (PLAVIX) 75 MG tablet   Chronic gout of right foot, unspecified cause       Relevant Medications   allopurinol (ZYLOPRIM) 100 MG tablet       A1c looks good at 6.6.  Patient seems to be doing well except for some dental issues. Follow up plan: Return in about 3 months (around  01/06/2023), or if symptoms worsen or fail to improve, for 2 diabetes recheck.  Counseling provided for all of the vaccine components Orders Placed This Encounter  Procedures   Bayer DCA Hb A1c Waived    Arville Care, MD [facility] 10/06/2022, 10:48 AM

## 2022-10-26 DIAGNOSIS — D125 Benign neoplasm of sigmoid colon: Secondary | ICD-10-CM | POA: Diagnosis not present

## 2022-10-26 DIAGNOSIS — D124 Benign neoplasm of descending colon: Secondary | ICD-10-CM | POA: Diagnosis not present

## 2022-10-26 DIAGNOSIS — D123 Benign neoplasm of transverse colon: Secondary | ICD-10-CM | POA: Diagnosis not present

## 2022-10-26 DIAGNOSIS — K5730 Diverticulosis of large intestine without perforation or abscess without bleeding: Secondary | ICD-10-CM | POA: Diagnosis not present

## 2022-10-26 DIAGNOSIS — K529 Noninfective gastroenteritis and colitis, unspecified: Secondary | ICD-10-CM | POA: Diagnosis not present

## 2022-10-26 DIAGNOSIS — Z86010 Personal history of colonic polyps: Secondary | ICD-10-CM | POA: Diagnosis not present

## 2022-10-26 DIAGNOSIS — K649 Unspecified hemorrhoids: Secondary | ICD-10-CM | POA: Diagnosis not present

## 2022-10-26 LAB — HM COLONOSCOPY

## 2022-10-28 ENCOUNTER — Ambulatory Visit (INDEPENDENT_AMBULATORY_CARE_PROVIDER_SITE_OTHER): Payer: PPO

## 2022-10-28 DIAGNOSIS — D124 Benign neoplasm of descending colon: Secondary | ICD-10-CM | POA: Diagnosis not present

## 2022-10-28 DIAGNOSIS — D123 Benign neoplasm of transverse colon: Secondary | ICD-10-CM | POA: Diagnosis not present

## 2022-10-28 DIAGNOSIS — E1169 Type 2 diabetes mellitus with other specified complication: Secondary | ICD-10-CM

## 2022-10-28 DIAGNOSIS — D125 Benign neoplasm of sigmoid colon: Secondary | ICD-10-CM | POA: Diagnosis not present

## 2022-10-28 DIAGNOSIS — I152 Hypertension secondary to endocrine disorders: Secondary | ICD-10-CM

## 2022-10-28 NOTE — Chronic Care Management (AMB) (Signed)
Chronic Care Management   CCM RN Visit Note  10/28/2022 Name: Karen Potter MRN: [id number] DOB: Oct 28, 1940  Subjective: Karen Potter is a 82 y.o. year old female who is a primary care patient of Dettinger, Elige Radon, MD. The patient was referred to the Chronic Care Management team for assistance with care management needs subsequent to provider initiation of CCM services and plan of care.    Today's Visit:  Engaged with patient by telephone for follow up visit.        Goals Addressed             This Visit's Progress    CCM (DIABETES) EXPECTED OUTCOME: MONITOR, SELF-MANAGE AND REDUCE SYMPTOMS OF DIABETES       Current Barriers:  Knowledge Deficits related to Diabetes management Care Coordination needs related to medications (currently working with pharmacist) in a patient with Diabetes Chronic Disease Management support and education needs related to Diabetes, diet Patient reports she has been checking CBG fasting only- with fasting ranges 130-150, pt reports medications changes made a difference with AIC down to 6.6 on 10/06/22, [facility] pharmacist has been working with patient, pt is pleased with the progress she has made and the improvement in [facility] Patient reports she drinks water and soft drinks  Patient does not participate in exercise at present  Planned Interventions: Reviewed medications with patient and discussed importance of medication adherence;        Counseled on importance of regular laboratory monitoring as prescribed;        Advised patient, providing education and rationale, to check cbg twice daily and record        call provider for findings outside established parameters;       Review of patient status, including review of consultants reports, relevant laboratory and other test results, and medications completed;       Advised patient to discuss any issues with blood sugar, medications with provider;      Reviewed all upcoming scheduled appointments Reviewed  carbohydrate modified diet  Symptom Management: Take medications as prescribed   Attend all scheduled provider appointments Call pharmacy for medication refills 3-7 days in advance of running out of medications Attend church or other social activities Perform all self care activities independently  Perform IADL's (shopping, preparing meals, housekeeping, managing finances) independently Call provider office for new concerns or questions  check blood sugar at prescribed times: twice daily check feet daily for cuts, sores or redness enter blood sugar readings and medication or insulin into daily log take the blood sugar log to all doctor visits take the blood sugar meter to all doctor visits trim toenails straight across fill half of plate with vegetables limit fast food meals to no more than 1 per week manage portion size prepare main meal at home 3 to 5 days each week read food labels for fat, fiber, carbohydrates and portion size set a realistic goal keep feet up while sitting Try to do some type of exercise daily even if for a few minutes Get outside daily Continue working with pharmacist as needed Try to slowly cut down on the soft drinks  Follow Up Plan: Telephone follow up appointment with care management team member scheduled for:   01/20/23 at 215 pm       CCM (HYPERTENSION) EXPECTED OUTCOME: MONITOR, SELF-MANAGE AND REDUCE SYMPTOMS OF HYPERTENSION       Current Barriers:  Knowledge Deficits related to Hypertension management Chronic Disease Management support and education needs related to  Hypertension, diet, exercise Patient reports she lives with spouse, is independent in all aspects of her care, continues to drive, does not exercise Patient reports she checks blood pressure on occasion and readings " are usually good" Patient reports she had PET scan/ cardiac imaging study, followed up with cardiologist and findings unremarkable  Planned Interventions: Evaluation of  current treatment plan related to hypertension self management and patient's adherence to plan as established by provider;   Reviewed medications with patient and discussed importance of compliance;  Counseled on the importance of exercise goals with target of 150 minutes per week Advised patient, providing education and rationale, to monitor blood pressure daily and record, calling PCP for findings outside established parameters;  Advised patient to discuss any issues with blood pressure, medications/ side effects with provider; Discussed complications of poorly controlled blood pressure such as heart disease, stroke, circulatory complications, vision complications, kidney impairment, sexual dysfunction;  Reviewed importance of adherence to low sodium diet and reading food labels  Symptom Management: Take medications as prescribed   Attend all scheduled provider appointments Call pharmacy for medication refills 3-7 days in advance of running out of medications Attend church or other social activities Perform all self care activities independently  Perform IADL's (shopping, preparing meals, housekeeping, managing finances) independently Call provider office for new concerns or questions  check blood pressure weekly choose a place to take my blood pressure (home, clinic or office, retail store) write blood pressure results in a log or diary learn about high blood pressure keep a blood pressure log call doctor for signs and symptoms of high blood pressure develop an action plan for high blood pressure keep all doctor appointments take medications for blood pressure exactly as prescribed begin an exercise program report new symptoms to your doctor eat more whole grains, fruits and vegetables, lean meats and healthy fats Follow low sodium diet- read food labels for sodium content Limit fast food  Follow Up Plan: Telephone follow up appointment with care management team member scheduled  for:  01/20/23 at 215 pm          Plan:Telephone follow up appointment with care management team member scheduled for:  01/20/23 at 215 pm  Irving Shows [facility], BSN RN Case Manager [facility] [phone]

## 2022-10-28 NOTE — Patient Instructions (Signed)
Please call the care guide team at [phone] if you need to cancel or reschedule your appointment.   If you are experiencing a Mental Health or Behavioral Health Crisis or need someone to talk to, please call the Suicide and Crisis Lifeline: 988 call the [place] National Suicide Prevention Lifeline: (339)533-8827 or TTY: 714-639-2527 TTY (424)006-6778) to talk to a trained counselor call 1-800-273-TALK (toll free, 24 hour hotline) go to [facility] [address] [place] [phone]) call the [facility]: [phone] call 911   Following is a copy of the CCM Program Consent:  CCM service includes personalized support from designated clinical staff supervised by the physician, including individualized plan of care and coordination with other care providers 24/7 contact phone numbers for assistance for urgent and routine care needs. Service will only be billed when office clinical staff spend 20 minutes or more in a month to coordinate care. Only one practitioner may furnish and bill the service in a calendar month. The patient may stop CCM services at amy time (effective at the end of the month) by phone call to the office staff. The patient will be responsible for cost sharing (co-pay) or up to 20% of the service fee (after annual deductible is met)  Following is a copy of your full provider care plan:   Goals Addressed             This Visit's Progress    CCM (DIABETES) EXPECTED OUTCOME: MONITOR, SELF-MANAGE AND REDUCE SYMPTOMS OF DIABETES       Current Barriers:  Knowledge Deficits related to Diabetes management Care Coordination needs related to medications (currently working with pharmacist) in a patient with Diabetes Chronic Disease Management support and education needs related to Diabetes, diet Patient reports she has been checking CBG fasting only- with fasting ranges 130-150, pt reports medications changes made a  difference with AIC down to 6.6 on 10/06/22, [facility] pharmacist has been working with patient, pt is pleased with the progress she has made and the improvement in [facility] Patient reports she drinks water and soft drinks  Patient does not participate in exercise at present  Planned Interventions: Reviewed medications with patient and discussed importance of medication adherence;        Counseled on importance of regular laboratory monitoring as prescribed;        Advised patient, providing education and rationale, to check cbg twice daily and record        call provider for findings outside established parameters;       Review of patient status, including review of consultants reports, relevant laboratory and other test results, and medications completed;       Advised patient to discuss any issues with blood sugar, medications with provider;      Reviewed all upcoming scheduled appointments Reviewed carbohydrate modified diet  Symptom Management: Take medications as prescribed   Attend all scheduled provider appointments Call pharmacy for medication refills 3-7 days in advance of running out of medications Attend church or other social activities Perform all self care activities independently  Perform IADL's (shopping, preparing meals, housekeeping, managing finances) independently Call provider office for new concerns or questions  check blood sugar at prescribed times: twice daily check feet daily for cuts, sores or redness enter blood sugar readings and medication or insulin into daily log take the blood sugar log to all doctor visits take the blood sugar meter to all doctor visits trim toenails straight across fill half of plate  with vegetables limit fast food meals to no more than 1 per week manage portion size prepare main meal at home 3 to 5 days each week read food labels for fat, fiber, carbohydrates and portion size set a realistic goal keep feet up while sitting Try to do some  type of exercise daily even if for a few minutes Get outside daily Continue working with pharmacist as needed Try to slowly cut down on the soft drinks  Follow Up Plan: Telephone follow up appointment with care management team member scheduled for:   01/20/23 at 215 pm       CCM (HYPERTENSION) EXPECTED OUTCOME: MONITOR, SELF-MANAGE AND REDUCE SYMPTOMS OF HYPERTENSION       Current Barriers:  Knowledge Deficits related to Hypertension management Chronic Disease Management support and education needs related to Hypertension, diet, exercise Patient reports she lives with spouse, is independent in all aspects of her care, continues to drive, does not exercise Patient reports she checks blood pressure on occasion and readings " are usually good" Patient reports she had PET scan/ cardiac imaging study, followed up with cardiologist and findings unremarkable  Planned Interventions: Evaluation of current treatment plan related to hypertension self management and patient's adherence to plan as established by provider;   Reviewed medications with patient and discussed importance of compliance;  Counseled on the importance of exercise goals with target of 150 minutes per week Advised patient, providing education and rationale, to monitor blood pressure daily and record, calling PCP for findings outside established parameters;  Advised patient to discuss any issues with blood pressure, medications/ side effects with provider; Discussed complications of poorly controlled blood pressure such as heart disease, stroke, circulatory complications, vision complications, kidney impairment, sexual dysfunction;  Reviewed importance of adherence to low sodium diet and reading food labels  Symptom Management: Take medications as prescribed   Attend all scheduled provider appointments Call pharmacy for medication refills 3-7 days in advance of running out of medications Attend church or other social  activities Perform all self care activities independently  Perform IADL's (shopping, preparing meals, housekeeping, managing finances) independently Call provider office for new concerns or questions  check blood pressure weekly choose a place to take my blood pressure (home, clinic or office, retail store) write blood pressure results in a log or diary learn about high blood pressure keep a blood pressure log call doctor for signs and symptoms of high blood pressure develop an action plan for high blood pressure keep all doctor appointments take medications for blood pressure exactly as prescribed begin an exercise program report new symptoms to your doctor eat more whole grains, fruits and vegetables, lean meats and healthy fats Follow low sodium diet- read food labels for sodium content Limit fast food  Follow Up Plan: Telephone follow up appointment with care management team member scheduled for:  01/20/23 at 215 pm          Patient verbalizes understanding of instructions and care plan provided today and agrees to view in MyChart. Active MyChart status and patient understanding of how to access instructions and care plan via MyChart confirmed with patient.  Telephone follow up appointment with care management team member scheduled for:  01/20/23 at 215 pm

## 2022-11-07 DIAGNOSIS — E1159 Type 2 diabetes mellitus with other circulatory complications: Secondary | ICD-10-CM

## 2022-11-07 DIAGNOSIS — I10 Essential (primary) hypertension: Secondary | ICD-10-CM | POA: Diagnosis not present

## 2022-11-15 ENCOUNTER — Other Ambulatory Visit: Payer: Self-pay | Admitting: Family Medicine

## 2022-11-15 DIAGNOSIS — K219 Gastro-esophageal reflux disease without esophagitis: Secondary | ICD-10-CM

## 2022-11-17 ENCOUNTER — Encounter: Payer: Self-pay | Admitting: Family Medicine

## 2022-11-17 ENCOUNTER — Ambulatory Visit (INDEPENDENT_AMBULATORY_CARE_PROVIDER_SITE_OTHER): Payer: PPO

## 2022-11-17 ENCOUNTER — Ambulatory Visit (INDEPENDENT_AMBULATORY_CARE_PROVIDER_SITE_OTHER): Payer: PPO | Admitting: Family Medicine

## 2022-11-17 VITALS — BP 108/60 | HR 71 | Temp 97.8°F | Ht 64.0 in | Wt 227.2 lb

## 2022-11-17 DIAGNOSIS — M79671 Pain in right foot: Secondary | ICD-10-CM

## 2022-11-17 DIAGNOSIS — M779 Enthesopathy, unspecified: Secondary | ICD-10-CM | POA: Diagnosis not present

## 2022-11-17 DIAGNOSIS — E790 Hyperuricemia without signs of inflammatory arthritis and tophaceous disease: Secondary | ICD-10-CM | POA: Diagnosis not present

## 2022-11-17 DIAGNOSIS — M19071 Primary osteoarthritis, right ankle and foot: Secondary | ICD-10-CM | POA: Diagnosis not present

## 2022-11-17 MED ORDER — BETAMETHASONE SOD PHOS & ACET 6 (3-3) MG/ML IJ SUSP
6.0000 mg | Freq: Once | INTRAMUSCULAR | Status: AC
Start: 2022-11-17 — End: 2022-11-17
  Administered 2022-11-17: 6 mg via INTRAMUSCULAR

## 2022-11-17 NOTE — Progress Notes (Signed)
Subjective:  Patient ID: Karen Potter, female    DOB: Sep 13, 1940  Age: 82 y.o. MRN: [id number]  CC: Foot Pain (Right foot pain and swelling)   HPI [name] presents for right foot pain increasing over 6 days. NKI. She has been working in her yard, watering, pruning, etc. Pain so severe she can't walk exccept with a cane in the other hand to take weight off the right. Not red like gout, but has had a lot of swelling. Not as much today. Pain is primarily at the medial malleolus and superior and anteriorly, moving into the midfoot. Hx of gout and hyperuricemia.     11/17/2022   11:05 AM 10/06/2022   10:09 AM 06/23/2022    3:35 PM  Depression screen PHQ 2/9  Decreased Interest 0 0 0  Down, Depressed, Hopeless 0 0 0  PHQ - 2 Score 0 0 0  Altered sleeping  0   Tired, decreased energy  0   Change in appetite  0   Feeling bad or failure about yourself   0   Trouble concentrating  0   Moving slowly or fidgety/restless  0   Suicidal thoughts  0   PHQ-9 Score  0   Difficult doing work/chores  Not difficult at all     History Aslin has a past medical history of Duodenal ulcer without hemorrhage or perforation, Fatty liver, Gastric ulcer without hemorrhage or perforation, Gastritis, GERD (gastroesophageal reflux disease), Hearing loss, History of benign parathyroid tumor, 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   She has a past surgical history that includes Carpal tunnel release (Right, 05/10/2007); Parathyroidectomy (N/A, 05/06/2015); Esophagogastroduodenoscopy (N/A, 08/29/2015); Ovarian cyst surgery  (Right, 05/10/1964); Total abdominal hysterectomy w/ bilateral salpingoophorectomy (05/10/1982); Total knee arthroplasty (Right, 04/08/2008); Knee arthroscopy (Right, 05/10/1997); Lumbar spine surgery (09/01/2010); transthoracic echocardiogram (04/07/2012); Tonsillectomy (as child); Cholecystectomy open (05/11/1983); LEFT HEEL REPAIR SURGERY (1954;  1985;  1995); CYSTO/  RIGHT URETEROSCOPIC STONE EXTRACTON/  STENT PLACEMENT (06/ 2016   in [place]); Cystoscopy/retrograde/ureteroscopy/stone extraction with basket (Right, 11/17/2015); Holmium laser application (Right, 11/17/2015); and Back surgery.   Her family history includes Cancer in her brother, father, paternal grandfather, and paternal grandmother; Heart failure in her mother; Stroke in her maternal grandfather.She reports that she has never smoked. She has never used smokeless tobacco. She reports that she does not drink alcohol and does not use drugs.    ROS Review of Systems  Constitutional:  Positive for activity change. Negative for chills, diaphoresis and fever.  Respiratory: Negative.    Cardiovascular: Negative.   Gastrointestinal: Negative.   Musculoskeletal:  Positive for arthralgias and gait problem.  Neurological:  Negative for dizziness (no falling).  Psychiatric/Behavioral: Negative.      Objective:  BP 108/60   Pulse 71   Temp 97.8 F (36.6 C)   Ht 5\' 4"  (1.626 m)   Wt 227 lb 3.2 oz (103.1 kg)   SpO2 93%   BMI 39.00 kg/m   BP Readings from Last 3 Encounters:  11/17/22 108/60  10/06/22 125/81  08/04/22 (!) 147/74    Wt Readings from Last 3 Encounters:  11/17/22 227 lb 3.2 oz (103.1 kg)  10/06/22 230 lb (104.3 kg)  07/21/22 235 lb (106.6 kg)     Physical Exam Constitutional:      Appearance: Normal appearance.  HENT:     Head: Normocephalic and atraumatic.  Pulmonary:     Effort: Pulmonary effort is normal.  Musculoskeletal:        General: Tenderness (right medial malleolus is tender, all ligaments  intact. No Laxity. No edema. Dicoloration of  iron deposits, varicose veins noted.) present.     Comments: Antalgic gait. Using cane for support, leaning to the left.   Neurological:     General: No focal deficit present.     Mental Status: She is oriented to person, place, and time.  Psychiatric:        Mood and Affect: Mood normal.        Behavior: Behavior normal.    XR - moderately severe arthritis changes with multiple large plantar calcaneal and achilles spurs.  Preliminary reading done by Farris Has    Assessment & Plan:   Amzie was seen today for foot pain.  Diagnoses and all orders for this visit:  Acute pain of right foot -     DG Foot Complete Right; Future -     Uric acid -     CBC with Differential/Platelet  Hyperuricemia -     Uric acid       I am having [name] maintain her Vitamin D3, Fish Oil Triple Strength, Blood Glucose Monitor System, Lancets Misc., Elderberry, acetaminophen, albuterol, carvedilol, clotrimazole-betamethasone, nystatin, L-FORMULA LYSINE HCL PO, furosemide, OneTouch Verio, clobetasol ointment, rosuvastatin, dapagliflozin propanediol, levothyroxine, sitaGLIPtin, losartan, clopidogrel, allopurinol, and pantoprazole.  Allergies as of 11/17/2022       Reactions   Codeine Itching   Dilaudid [hydromorphone] Other (See Comments)   Mouth blisters   Other Itching   Most pain meds cause itching.  When has to take pain medication, she has been instructed to take Benadryl   Hydrocodone-acetaminophen Other (See Comments)   Doxycycline Rash, Other (See Comments)        Medication List        Accurate as of November 17, 2022 11:44 AM. If you have any questions, ask your nurse or doctor.          acetaminophen 650 MG CR tablet Commonly known as: TYLENOL Take 650 mg by mouth in the morning.   albuterol 108 (90 Base) MCG/ACT inhaler Commonly known as: VENTOLIN HFA Inhale 2 puffs into the lungs every 6 (six) hours as needed  for wheezing or shortness of breath.   allopurinol 100 MG tablet Commonly known as: ZYLOPRIM Take 1 tablet (100 mg total) by mouth daily.   Blood Glucose Monitor System w/Device Kit Use glucose meter to check blood sugar up to 3 times daily as instructed. One Touch Verio preferred by insurance. 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   clobetasol ointment 0.05 % Commonly known as: TEMOVATE Apply 1 Application topically 2 (two) times daily.   clopidogrel 75 MG tablet Commonly known as: PLAVIX Take 1 tablet (75 mg total) by mouth every evening.   clotrimazole-betamethasone cream Commonly known as: LOTRISONE Apply 1 Application topically daily.   dapagliflozin propanediol 10 MG Tabs tablet Commonly known as: FARXIGA Take 1 tablet (10 mg total) by mouth daily before breakfast.   Elderberry 500 MG Caps Take 500 mg by mouth daily.   Fish Oil Triple Strength  1400 MG Caps Take 1,000 mg by mouth 4 (four) times daily.   furosemide 40 MG tablet Commonly known as: LASIX Take 1 tablet (40 mg total) by mouth daily.   L-FORMULA LYSINE HCL PO Take 1 Dose by mouth daily.   Lancets Misc. Kit Use patient & insurance preferred lancet device to test blood glucose up to 3 times daily for Dx: E11.69   levothyroxine 137 MCG tablet Commonly known as: SYNTHROID TAKE 1 TABLET BY MOUTH ONCE DAILY BEFORE BREAKFAST   losartan 25 MG tablet Commonly known as: COZAAR Take 1 tablet (25 mg total) by mouth daily.   nystatin powder Commonly known as: MYCOSTATIN/NYSTOP Apply 1 Application topically 3 (three) times daily.   OneTouch Verio test strip Generic drug: glucose blood USE 1 STRIP TO CHECK GLUCOSE UP TO THREE TIMES DAILY AS  INSTRUCTED   pantoprazole 40 MG tablet Commonly known as: PROTONIX Take 1 tablet by mouth once daily   rosuvastatin 5 MG tablet Commonly known as: Crestor Take 1  tablet (5 mg total) by mouth daily.   sitaGLIPtin 100 MG tablet Commonly known as: Januvia Take 1 tablet (100 mg total) by mouth daily.   Vitamin D3 50 MCG (2000 UT) capsule Take 2,000 Units by mouth 2 (two) times daily.       eGFR = 82. Consider NSAIDS if cortisone injection not successful in relieving pain  Follow-up: Return in about 2 weeks (around 12/01/2022) for for routine care and follow up of foot pain.  Mechele Claude, M.D.

## 2022-11-17 NOTE — Addendum Note (Signed)
Addended by: Adella Hare B on: 11/17/2022 11:51 AM   Modules accepted: Orders

## 2022-11-18 LAB — CBC WITH DIFFERENTIAL/PLATELET
Basophils Absolute: 0.1 10*3/uL (ref 0.0–0.2)
Basos: 1 %
EOS (ABSOLUTE): 0.2 10*3/uL (ref 0.0–0.4)
Eos: 3 %
Hematocrit: 41.1 % (ref 34.0–46.6)
Hemoglobin: 13.6 g/dL (ref 11.1–15.9)
Immature Grans (Abs): 0 10*3/uL (ref 0.0–0.1)
Immature Granulocytes: 0 %
Lymphocytes Absolute: 2.1 10*3/uL (ref 0.7–3.1)
Lymphs: 27 %
MCH: 29.8 pg (ref 26.6–33.0)
MCHC: 33.1 g/dL (ref 31.5–35.7)
MCV: 90 fL (ref 79–97)
Monocytes Absolute: 1 10*3/uL — ABNORMAL HIGH (ref 0.1–0.9)
Monocytes: 14 %
Neutrophils Absolute: 4.3 10*3/uL (ref 1.4–7.0)
Neutrophils: 55 %
Platelets: 298 10*3/uL (ref 150–450)
RBC: 4.57 x10E6/uL (ref 3.77–5.28)
RDW: 13.2 % (ref 11.7–15.4)
WBC: 7.7 10*3/uL (ref 3.4–10.8)

## 2022-11-18 LAB — URIC ACID: Uric Acid: 4.2 mg/dL (ref 3.1–7.9)

## 2022-11-20 NOTE — Progress Notes (Signed)
Hello Rolande,  Your lab result is normal and/or stable.Some minor variations that are not significant are commonly marked abnormal, but do not represent any medical problem for you.  Best regards, Dirck Butch, M.D.

## 2022-12-03 ENCOUNTER — Encounter: Payer: Self-pay | Admitting: Pharmacist

## 2022-12-03 NOTE — Telephone Encounter (Signed)
Reached out to [facility] regarding refill of medication.   Rep was able to remove auto-refill.

## 2022-12-03 NOTE — Telephone Encounter (Signed)
My chart message sent to patient to inform her of rybelsus cancellation Continue meds as prescribed

## 2022-12-06 ENCOUNTER — Ambulatory Visit (INDEPENDENT_AMBULATORY_CARE_PROVIDER_SITE_OTHER): Payer: PPO | Admitting: Family Medicine

## 2022-12-06 ENCOUNTER — Encounter: Payer: Self-pay | Admitting: Family Medicine

## 2022-12-06 VITALS — BP 126/68 | HR 66 | Temp 98.1°F | Ht 64.0 in | Wt 225.2 lb

## 2022-12-06 DIAGNOSIS — L309 Dermatitis, unspecified: Secondary | ICD-10-CM

## 2022-12-06 MED ORDER — CLOBETASOL PROPIONATE 0.05 % EX OINT: 1.0000 | TOPICAL_OINTMENT | Freq: Two times a day (BID) | CUTANEOUS | 3 refills | Status: DC

## 2022-12-06 MED ORDER — PREDNISONE 10 MG (21) PO TBPK
ORAL_TABLET | ORAL | 0 refills | Status: DC
Start: 2022-12-06 — End: 2022-12-27

## 2022-12-06 MED ORDER — LEVOCETIRIZINE DIHYDROCHLORIDE 5 MG PO TABS
2.5000 mg | ORAL_TABLET | Freq: Every evening | ORAL | 0 refills | Status: DC
Start: 2022-12-06 — End: 2023-07-20

## 2022-12-06 NOTE — Patient Instructions (Signed)
Contact Dermatitis Dermatitis is redness, soreness, and swelling (inflammation) of the skin. Contact dermatitis is a reaction to certain substances that touch the skin. There are two types of this condition: Irritant contact dermatitis. This is the most common type. It happens when something irritates your skin, such as when your hands get dry from washing them too often with soap. You can get this type of reaction even if you have not been exposed to the irritant before. Allergic contact dermatitis. This type is caused by a substance that you are allergic to, such as poison ivy. It occurs when you have been exposed to the substance (allergen) and form a sensitivity to it. In some cases, the reaction may start soon after your first exposure to the allergen. In other cases, it may not start until you are exposed to the allergen again. It may then occur every time you are exposed to the allergen in the future. What are the causes? Irritant contact dermatitis is often caused by exposure to: Makeup. Soaps, detergents, and bleaches. Acids. Metal salts, such as nickel. Allergic contact dermatitis is often caused by exposure to: Poisonous plants. Chemicals. Jewelry. Latex. Medicines. Preservatives in products, such as clothes. What increases the risk? You are more likely to get this condition if you have: A job that exposes you to irritants or allergens. Certain medical conditions. These include asthma and eczema. What are the signs or symptoms? Symptoms of this condition may occur in any place on your body that has been touched by the irritant. Symptoms include: Dryness, flaking, or cracking. Redness. Itching. Pain or a burning feeling. Blisters. Drainage of small amounts of blood or clear fluid from skin cracks. With allergic contact dermatitis, there may also be swelling in areas such as the eyelids, mouth, or genitals. How is this diagnosed? This condition is diagnosed with a medical  history and physical exam. A patch skin test may be done to help figure out the cause. If the condition is related to your job, you may need to see an expert in health problems in the workplace (occupational medicine specialist). How is this treated? This condition is treated by staying away from the cause of the reaction and protecting your skin from further contact. Treatment may also include: Steroid creams or ointments. Steroid medicines may need be taken by mouth (orally) in more severe cases. Antibiotics or medicines applied to the skin to kill bacteria (antibacterial ointments). These may be needed if a skin infection is present. Antihistamines. These may be taken orally or put on as a lotion to ease itching. A bandage (dressing). Follow these instructions at home: Skin care Moisturize your skin as needed. Put cool, wet cloths (cool compresses) on the affected areas. Try applying baking soda paste to your skin. Stir water into baking soda until it has the consistency of a paste. Do not scratch your skin. Avoid friction to the affected area. Avoid the use of soaps, perfumes, and dyes. Check the affected areas every day for signs of infection. Check for: More redness, swelling, or pain. More fluid or blood. Warmth. Pus or a bad smell. Medicines Take or apply over-the-counter and prescription medicines only as told by your health care provider. If you were prescribed antibiotics, take or apply them as told by your health care provider. Do not stop using the antibiotic even if you start to feel better. Bathing Try taking a bath with: Epsom salts. Follow the instructions on the packaging. You can get these at your local pharmacy   or grocery store. Baking soda. Pour a small amount into the bath as told by your health care provider. Colloidal oatmeal. Follow the instructions on the packaging. You can get this at your local pharmacy or grocery store. Bathe less often. This may mean bathing  every other day. Bathe in lukewarm water. Avoid using hot water. Bandage care If you were given a dressing, change it as told by your health care provider. Wash your hands with soap and water for at least 20 seconds before and after you change your dressing. If soap and water are not available, use hand sanitizer. General instructions Avoid the substance that caused your reaction. If you do not know what caused it, keep a journal to try to track what caused it. Write down: What you eat and drink. What cosmetics you use. What you wear in the affected area. This includes jewelry. Contact a health care provider if: Your condition does not get better with treatment. Your condition gets worse. You have any signs of infection. You have a fever. You have new symptoms. Your bone or joint under the affected area becomes painful after the skin has healed. Get help right away if: You notice red streaks coming from the affected area. The affected area turns darker. You have trouble breathing. This information is not intended to replace advice given to you by your health care provider. Make sure you discuss any questions you have with your health care provider. Document Revised: 10/30/2021 Document Reviewed: 10/30/2021 [employer] Patient Education  2024 [employer]  

## 2022-12-06 NOTE — Progress Notes (Signed)
   Acute Office Visit  Subjective:     Patient ID: Karen Potter, female    DOB: 1940-07-13, 82 y.o.   MRN: [id number]  Chief Complaint  Patient presents with   Bite    Rash The current episode started in the past 7 days. The problem has been gradually worsening since onset. The affected locations include the left wrist. The rash is characterized by blistering, redness, itchiness and swelling (weeping clear fluid). It is unknown (she has been gardening) if there was an exposure to a precipitant. Pertinent negatives include no anorexia, congestion, cough, diarrhea, eye pain, facial edema, fatigue, fever, joint pain, rhinorrhea, shortness of breath, sore throat or vomiting. Past treatments include topical steroids and antibiotic cream. The treatment provided mild relief.    Review of Systems  Constitutional:  Negative for fatigue and fever.  HENT:  Negative for congestion, rhinorrhea and sore throat.   Eyes:  Negative for pain.  Respiratory:  Negative for cough and shortness of breath.   Gastrointestinal:  Negative for anorexia, diarrhea and vomiting.  Musculoskeletal:  Negative for joint pain.  Skin:  Positive for rash.        Objective:    BP 126/68   Pulse 66   Temp 98.1 F (36.7 C) (Temporal)   Ht 5\' 4"  (1.626 m)   Wt 225 lb 4 oz (102.2 kg)   SpO2 98%   BMI 38.66 kg/m    Physical Exam Vitals and nursing note reviewed.  Constitutional:      General: She is not in acute distress.    Appearance: She is not ill-appearing, toxic-appearing or diaphoretic.  Pulmonary:     Effort: Pulmonary effort is normal. No respiratory distress.  Musculoskeletal:     Cervical back: Normal range of motion. No rigidity.  Skin:    General: Skin is warm and dry.     Findings: Rash present. Rash is vesicular (5 cm x 5 xm area to palmer aspect of left wrist. Raised, erythemaous with surrounding vesicles. Weeping clear fluid. No warmth or tenderness).  Neurological:     General: No focal  deficit present.     Mental Status: She is alert and oriented to person, place, and time.  Psychiatric:        Mood and Affect: Mood normal.        Behavior: Behavior normal.     No results found for any visits on 12/06/22.      Assessment & Plan:   Karen Potter was seen today for bite.  Diagnoses and all orders for this visit:  Dermatitis Prednisone burst as below. Higher potency steroid cream ordered BID. Xyzal prn for itching. Return to office for new or worsening symptoms, or if symptoms persist.  -     predniSONE (STERAPRED UNI-PAK 21 TAB) 10 MG (21) TBPK tablet; Use as directed on back of pill pack -     clobetasol ointment (TEMOVATE) 0.05 %; Apply 1 Application topically 2 (two) times daily. -     levocetirizine (XYZAL) 5 MG tablet; Take 0.5 tablets (2.5 mg total) by mouth every evening.  The patient indicates understanding of these issues and agrees with the plan.  Gabriel Earing, FNP

## 2022-12-08 ENCOUNTER — Ambulatory Visit (INDEPENDENT_AMBULATORY_CARE_PROVIDER_SITE_OTHER): Payer: PPO

## 2022-12-08 VITALS — Ht 64.0 in | Wt 225.0 lb

## 2022-12-08 DIAGNOSIS — Z780 Asymptomatic menopausal state: Secondary | ICD-10-CM | POA: Diagnosis not present

## 2022-12-08 DIAGNOSIS — Z0000 Encounter for general adult medical examination without abnormal findings: Secondary | ICD-10-CM | POA: Diagnosis not present

## 2022-12-08 NOTE — Progress Notes (Signed)
Subjective:   Karen Potter is a 82 y.o. female who presents for Medicare Annual (Subsequent) preventive examination.  Visit Complete: Virtual  I connected with  Karen Potter on 12/08/22 by a audio enabled telemedicine application and verified that I am speaking with the correct person using two identifiers.  Patient Location: Home  Provider Location: Home Office  I discussed the limitations of evaluation and management by telemedicine. The patient expressed understanding and agreed to proceed.  Patient Medicare AWV questionnaire was completed by the patient on 12/08/2022; I have confirmed that all information answered by patient is correct and no changes since this date.  Review of Systems    Vital Signs: Unable to obtain new vitals due to this being a telehealth visit.  Cardiac Risk Factors include: advanced age (>56men, >11 women);diabetes mellitus;dyslipidemia;hypertension Nutrition Risk Assessment:  Has the patient had any N/V/D within the last 2 months?  No  Does the patient have any non-healing wounds?  No  Has the patient had any unintentional weight loss or weight gain?  No   Diabetes:  Is the patient diabetic?  Yes  If diabetic, was a CBG obtained today?  No  Did the patient bring in their glucometer from home?  No  How often do you monitor your CBG's? Daily .   Financial Strains and Diabetes Management:  Are you having any financial strains with the device, your supplies or your medication? No .  Does the patient want to be seen by Chronic Care Management for management of their diabetes?  No  Would the patient like to be referred to a Nutritionist or for Diabetic Management?  No   Diabetic Exams:  Diabetic Eye Exam: Completed 01/2022 Diabetic Foot Exam: Overdue, Pt has been advised about the importance in completing this exam. Pt is scheduled for diabetic foot exam on next office visit .     Objective:    Today's Vitals   12/08/22 1315  Weight: 225  lb (102.1 kg)  Height: 5\' 4"  (1.626 m)   Body mass index is 38.62 kg/m.     12/08/2022    1:21 PM 06/23/2022    2:35 PM 12/04/2021    1:48 PM 06/14/2021    7:52 PM 06/14/2021    1:44 PM 12/03/2020    1:42 PM 11/23/2020    3:52 PM  Advanced Directives  Does Patient Have a Medical Advance Directive? Yes Yes Yes Yes No Yes No;Yes  Type of Estate agent of [place];Living will Healthcare Power of [place];Living will Healthcare Power of [place];Living will Living will  Healthcare Power of [place];Living will Living will  Does patient want to make changes to medical advance directive? No - Patient declined No - Patient declined  No - Patient declined     Copy of Healthcare Power of Attorney in Chart? Yes - validated most recent copy scanned in chart (See row information) No - copy requested Yes - validated most recent copy scanned in chart (See row information)   No - copy requested   Would patient like information on creating a medical advance directive?    No - Patient declined No - Patient declined      Current Medications (verified) Outpatient Encounter Medications as of 12/08/2022  Medication Sig   acetaminophen (TYLENOL) 650 MG CR tablet Take 650 mg by mouth in the morning.   albuterol (VENTOLIN HFA) 108 (90 Base) MCG/ACT inhaler Inhale 2 puffs into the lungs every 6 (six) hours as needed for  wheezing or shortness of breath.   allopurinol (ZYLOPRIM) 100 MG tablet Take 1 tablet (100 mg total) by mouth daily.   Blood Glucose Monitoring Suppl (BLOOD GLUCOSE MONITOR SYSTEM) w/Device KIT Use glucose meter to check blood sugar up to 3 times daily as instructed. One Touch Verio preferred by insurance. Dx: EE11.69   carvedilol (COREG) 6.25 MG tablet Take 1 tablet (6.25 mg total) by mouth 2 (two) times daily with a meal.   Cholecalciferol (VITAMIN D3) 2000 UNITS capsule Take 2,000 Units by mouth 2 (two) times daily.    clobetasol ointment (TEMOVATE) 0.05 % Apply 1 Application  topically 2 (two) times daily.   clopidogrel (PLAVIX) 75 MG tablet Take 1 tablet (75 mg total) by mouth every evening.   clotrimazole-betamethasone (LOTRISONE) cream Apply 1 Application topically daily.   dapagliflozin propanediol (FARXIGA) 10 MG TABS tablet Take 1 tablet (10 mg total) by mouth daily before breakfast.   Elderberry 500 MG CAPS Take 500 mg by mouth daily.   furosemide (LASIX) 40 MG tablet Take 1 tablet (40 mg total) by mouth daily.   glucose blood (ONETOUCH VERIO) test strip USE 1 STRIP TO CHECK GLUCOSE UP TO THREE TIMES DAILY AS  INSTRUCTED   L-FORMULA LYSINE HCL PO Take 1 Dose by mouth daily.   Lancets Misc. KIT Use patient & insurance preferred lancet device to test blood glucose up to 3 times daily for Dx: E11.69   levocetirizine (XYZAL) 5 MG tablet Take 0.5 tablets (2.5 mg total) by mouth every evening.   levothyroxine (SYNTHROID) 137 MCG tablet TAKE 1 TABLET BY MOUTH ONCE DAILY BEFORE BREAKFAST   losartan (COZAAR) 25 MG tablet Take 1 tablet (25 mg total) by mouth daily.   nystatin (MYCOSTATIN/NYSTOP) powder Apply 1 Application topically 3 (three) times daily.   Omega-3 Fatty Acids (FISH OIL TRIPLE STRENGTH) 1400 MG CAPS Take 1,000 mg by mouth 4 (four) times daily.    pantoprazole (PROTONIX) 40 MG tablet Take 1 tablet by mouth once daily   predniSONE (STERAPRED UNI-PAK 21 TAB) 10 MG (21) TBPK tablet Use as directed on back of pill pack   rosuvastatin (CRESTOR) 5 MG tablet Take 1 tablet (5 mg total) by mouth daily.   sitaGLIPtin (JANUVIA) 100 MG tablet Take 1 tablet (100 mg total) by mouth daily.   No facility-administered encounter medications on file as of 12/08/2022.    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   age 15--  pt states was quarantined   no treatment ;  per pt no symptoms or issues since   History of kidney stones    History of TIA (transient ischemic attack)    03-23-2014  w/ episode temporay amnesia   Hyperlipidemia    Hypertension    Hypothyroidism    Iron deficiency anemia    LAFB (left anterior fascicular block)    OA (osteoarthritis)    OSA on CPAP    severe per study  2003   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   Post-polio limb muscle weakness    left leg  w/ 3 repair surgery's   RBBB (right bundle branch block)    Seasonal allergies    TIA (transient ischemic attack)    2023   Type 2 diabetes mellitus (HCC)    Vitamin D deficiency    Past Surgical History:  Procedure Laterality Date   BACK SURGERY     CARPAL TUNNEL RELEASE Right 05/10/2007   CHOLECYSTECTOMY OPEN  05/11/1983   and Appendectomy   CYSTO/  RIGHT URETEROSCOPIC STONE EXTRACTON/  STENT PLACEMENT  06/ 2016   in [place]   CYSTOSCOPY/RETROGRADE/URETEROSCOPY/STONE EXTRACTION WITH BASKET Right 11/17/2015   Procedure: CYSTOSCOPY/RETROGRADE PYELOGRAM RIGHT/DIAGNOSTIC RIGHT URETEROSCOPY/LASER RENAL CALCIFICATION/RIGHT STENT PLACEMENT;  Surgeon: Malen Gauze, MD;  Location: [facility];  Service: Urology;  Laterality: Right;   ESOPHAGOGASTRODUODENOSCOPY N/A 08/29/2015   Procedure: ESOPHAGOGASTRODUODENOSCOPY (EGD);  Surgeon: West Bali, MD;  [facility];  Service: Endoscopy;  Laterality: N/A;   HOLMIUM LASER APPLICATION Right 11/17/2015   Procedure: HOLMIUM LASER APPLICATION;  Surgeon: Malen Gauze, MD;  [facility];  Service: Urology;  Laterality: Right;   KNEE  ARTHROSCOPY Right 05/10/1997   LEFT HEEL REPAIR SURGERY  1954;  1985;  1995   polio   LUMBAR SPINE SURGERY  09/01/2010   L4 -L5 fusion,  L3 laminectomy,  L5 - S1 foraminotomy   OVARIAN CYST SURGERY Right 05/10/1964   PARATHYROIDECTOMY N/A 05/06/2015   Procedure: PARATHYROIDECTOMY;  Surgeon: Darnell Level, MD;  Location: [facility];  Service: General;  Laterality: N/A;   left superior   TONSILLECTOMY  as child   TOTAL ABDOMINAL HYSTERECTOMY W/ BILATERAL SALPINGOOPHORECTOMY  05/10/1982   TOTAL KNEE ARTHROPLASTY Right 04/08/2008   TRANSTHORACIC ECHOCARDIOGRAM  04/07/2012   grade 1 diastolic function,  ef 60-65%/  mild AV calcification without stenosis/  trivial MR   Family History  Problem Relation Age of Onset   Heart failure Mother        No details.  No MI   Cancer Father        Lung   Cancer Brother        Lung   Stroke Maternal Grandfather    Cancer Paternal Grandmother        rectal   Cancer Paternal Grandfather        gastric   Sleep apnea Neg Hx    Social History   Socioeconomic History   Marital status: Married    Spouse name: Mickey   Number of children: 4   Years of education: 13-some college   Highest education level: High school graduate  Occupational History   Occupation: Retired  Tobacco Use   Smoking status: Never   Smokeless tobacco: Never  Vaping Use   Vaping status: Never Used  Substance and Sexual Activity   Alcohol use: No   Drug use: No   Sexual activity: Not Currently    Birth control/protection: Surgical  Other Topics Concern   Not on file  Social History Narrative   Lives with husband.  Four children.     Drinks 1-2 cokes per day at least, but usually drinks water   All children live fairly close. Son is closest, but works 12-13 hours/day.   Disabled daughter in [place] that she helps care for.   Another daughter lives within 15 minutes and takes her shopping  and to out of town doctor visits.   Social Determinants of Health   Financial Resource  Strain: Low Risk  (12/08/2022)   Overall Financial Resource Strain (CARDIA)    Difficulty of Paying Living Expenses: Not hard at all  Food Insecurity: No Food Insecurity (12/08/2022)   Hunger Vital Sign    Worried About Running Out of Food in the Last Year: Never true    Ran Out of Food in the Last Year: Never true  Transportation Needs: No Transportation Needs (12/08/2022)   PRAPARE - Administrator, Civil Service (Medical): No    Lack of Transportation (Non-Medical): No  Physical Activity: Inactive (12/08/2022)   Exercise Vital Sign    Days of Exercise per Week: 0 days    Minutes of Exercise per Session: 0 min  Stress: No Stress Concern Present (12/08/2022)   [employer] of Occupational Health - Occupational Stress Questionnaire    Feeling of Stress : Not at all  Social Connections: Socially Integrated (12/08/2022)   Social Connection and Isolation Panel [NHANES]    Frequency of Communication with Friends and Family: More than three times a week    Frequency of Social Gatherings with Friends and Family: More than three times a week    Attends Religious Services: More than 4 times per year    Active Member of [employer] or Organizations: Yes    Attends Engineer, structural: More than 4 times per year    Marital Status: Married    Tobacco Counseling Counseling given: Not Answered   Clinical Intake:  Pre-visit preparation completed: Yes  Pain : No/denies pain     Nutritional Risks: None Diabetes: Yes CBG done?: No Did pt. bring in CBG monitor from home?: No  How often do you need to have someone help you when you read instructions, pamphlets, or other written materials from your doctor or pharmacy?: 1 - Never  Interpreter Needed?: No  Information entered by :: Renie Ora, LPN   Activities of Daily Living    12/08/2022    1:21 PM  In your present state of health, do you have any difficulty performing the following activities:  Hearing? 0   Vision? 0  Difficulty concentrating or making decisions? 0  Walking or climbing stairs? 0  Dressing or bathing? 0  Doing errands, shopping? 0  Preparing Food and eating ? N  Using the Toilet? N  In the past six months, have you accidently leaked urine? N  Do you have problems with loss of bowel control? N  Managing your Medications? N  Managing your Finances? N  Housekeeping or managing your Housekeeping? N    Patient Care Team: Dettinger, Elige Radon, MD as PCP - General (Family Medicine) Rollene Rotunda, MD as PCP - Cardiology (Cardiology) Vida Rigger, MD as Consulting Physician (Gastroenterology) Ronne Binning Mardene Celeste, MD as Consulting Physician (Urology) Ignacia Bayley, MD as Referring Physician (Nurse Practitioner) Huston Foley, MD as Attending Physician (Neurology) Audrie Gallus, RN as [facility] Care Management  Indicate any recent Medical Services you may have received from other than [facility] providers in the past year (date may be approximate).     Assessment:   This is a routine wellness examination for Rylynne.  Hearing/Vision screen Vision Screening - Comments:: Wears rx glasses - up to date with routine eye exams with  Dr.Miller   Dietary issues and exercise activities discussed:     Goals Addressed  This Visit's Progress    Exercise 3x per week (30 min per time)         Depression Screen    12/08/2022    1:20 PM 12/06/2022   11:48 AM 11/17/2022   11:05 AM 10/06/2022   10:09 AM 06/23/2022    3:35 PM 06/23/2022    8:20 AM 03/22/2022   11:43 AM  PHQ 2/9 Scores  PHQ - 2 Score 0 0 0 0 0 0 0  PHQ- 9 Score 0 1  0   2    Fall Risk    12/08/2022    1:17 PM 12/06/2022   11:47 AM 11/17/2022   11:05 AM 10/06/2022   10:09 AM 06/23/2022    8:20 AM  Fall Risk   Falls in the past year? 1 1 1  0 0  Number falls in past yr: 1 1 0    Injury with Fall? 1 0     Risk for fall due to : History of fall(s);Impaired balance/gait;Orthopedic  patient History of fall(s) History of fall(s)    Follow up Education provided;Falls prevention discussed;Falls evaluation completed Falls evaluation completed Falls evaluation completed      MEDICARE RISK AT HOME:  Medicare Risk at Home - 12/08/22 1317     Any stairs in or around the home? Yes    If so, are there any without handrails? No    Home free of loose throw rugs in walkways, pet beds, electrical cords, etc? Yes    Adequate lighting in your home to reduce risk of falls? Yes    Life alert? No    Use of a cane, walker or w/c? No    Grab bars in the bathroom? Yes    Shower chair or bench in shower? Yes    Elevated toilet seat or a handicapped toilet? Yes             TIMED UP AND GO:  Was the test performed?  No    Cognitive Function:    08/29/2017    2:02 PM  MMSE - Mini Mental State Exam  Orientation to time 5  Orientation to Place 5  Registration 3  Attention/ Calculation 5  Recall 3  Language- name 2 objects 2  Language- repeat 1  Language- follow 3 step command 3  Language- read & follow direction 1  Write a sentence 1  Copy design 1  Total score 30        12/08/2022    1:21 PM 12/04/2021    1:50 PM 12/03/2019    1:45 PM 10/12/2018    2:41 PM  6CIT Screen  What Year? 0 points 0 points 0 points 0 points  What month? 0 points 0 points 0 points 0 points  What time? 0 points 0 points 0 points 0 points  Count back from 20 0 points 0 points 0 points 0 points  Months in reverse 0 points 0 points 0 points 0 points  Repeat phrase 0 points 2 points 2 points 0 points  Total Score 0 points 2 points 2 points 0 points    Immunizations Immunization History  Administered Date(s) Administered   Fluad Quad(high Dose 65+) 01/18/2019, 02/07/2020, 02/17/2021, 01/22/2022   Influenza, High Dose Seasonal PF 03/20/2013, 02/24/2016, 02/07/2017, 03/10/2018   Influenza,inj,Quad PF,6+ Mos 02/19/2014, 02/06/2015   Moderna Sars-Covid-2 Vaccination 07/04/2019, 08/01/2019,  03/27/2020, 09/23/2020, 02/14/2021   Pneumococcal Conjugate-13 08/05/2014   Pneumococcal Polysaccharide-23 02/07/2009   Tdap 01/07/2011   Zoster Recombinant(Shingrix) 04/10/2019, 06/23/2022  Zoster, Live 05/10/2010    TDAP status: Due, Education has been provided regarding the importance of this vaccine. Advised may receive this vaccine at local pharmacy or Health Dept. Aware to provide a copy of the vaccination record if obtained from local pharmacy or Health Dept. Verbalized acceptance and understanding.  Flu Vaccine status: Up to date  Pneumococcal vaccine status: Up to date  Covid-19 vaccine status: Completed vaccines  Qualifies for Shingles Vaccine? Yes   Zostavax completed Yes   Shingrix Completed?: Yes  Screening Tests Health Maintenance  Topic Date Due   DTaP/Tdap/Td (2 - Td or Tdap) 01/06/2021   COVID-19 Vaccine (6 - 2023-24 season) 01/08/2022   DEXA SCAN  10/06/2023 (Originally 08/30/2019)   INFLUENZA VACCINE  12/09/2022   MAMMOGRAM  01/01/2023   OPHTHALMOLOGY EXAM  02/04/2023   Diabetic kidney evaluation - Urine ACR  03/23/2023   HEMOGLOBIN A1C  04/08/2023   Diabetic kidney evaluation - eGFR measurement  06/24/2023   FOOT EXAM  10/06/2023   Medicare Annual Wellness (AWV)  12/08/2023   Colonoscopy  10/26/2027   Pneumonia Vaccine 55+ Years old  Completed   Zoster Vaccines- Shingrix  Completed   HPV VACCINES  Aged Out   Hepatitis C Screening  Discontinued    Health Maintenance  Health Maintenance Due  Topic Date Due   DTaP/Tdap/Td (2 - Td or Tdap) 01/06/2021   COVID-19 Vaccine (6 - 2023-24 season) 01/08/2022    Colorectal cancer screening: No longer required.   Mammogram status: No longer required due to age.  Bone Density status: Ordered 12/08/2022. Pt provided with contact info and advised to call to schedule appt.  Lung Cancer Screening: (Low Dose CT Chest recommended if Age 59-80 years, 20 pack-year currently smoking OR have quit w/in 15years.) does  not qualify.   Lung Cancer Screening Referral: n/a  Additional Screening:  Hepatitis C Screening: does not qualify;   Vision Screening: Recommended annual ophthalmology exams for early detection of glaucoma and other disorders of the eye. Is the patient up to date with their annual eye exam?  Yes  Who is the provider or what is the name of the office in which the patient attends annual eye exams? Dr.Miller  If pt is not established with a provider, would they like to be referred to a provider to establish care? No .   Dental Screening: Recommended annual dental exams for proper oral hygiene    Community Resource Referral / Chronic Care Management: CRR required this visit?  No   CCM required this visit?  No     Plan:     I have personally reviewed and noted the following in the patient's chart:   Medical and social history Use of alcohol, tobacco or illicit drugs  Current medications and supplements including opioid prescriptions. Patient is not currently taking opioid prescriptions. Functional ability and status Nutritional status Physical activity Advanced directives List of other physicians Hospitalizations, surgeries, and ER visits in previous 12 months Vitals Screenings to include cognitive, depression, and falls Referrals and appointments  In addition, I have reviewed and discussed with patient certain preventive protocols, quality metrics, and best practice recommendations. A written personalized care plan for preventive services as well as general preventive health recommendations were provided to patient.     Lorrene Reid, LPN   [id number]/61/0960   After Visit Summary: (MyChart) Due to this being a telephonic visit, the after visit summary with patients personalized plan was offered to patient via MyChart   Nurse  Notes: Due TDAP Vaccine

## 2022-12-08 NOTE — Patient Instructions (Signed)
Karen Potter , Thank you for taking time to come for your Medicare Wellness Visit. I appreciate your ongoing commitment to your health goals. Please review the following plan we discussed and let me know if I can assist you in the future.   Referrals/Orders/Follow-Ups/Clinician Recommendations: Aim for 30 minutes of exercise or brisk walking, 6-8 glasses of water, and 5 servings of fruits and vegetables each day.   This is a list of the screening recommended for you and due dates:  Health Maintenance  Topic Date Due   DTaP/Tdap/Td vaccine (2 - Td or Tdap) 01/06/2021   COVID-19 Vaccine (6 - 2023-24 season) 01/08/2022   DEXA scan (bone density measurement)  10/06/2023*   Flu Shot  12/09/2022   Mammogram  01/01/2023   Eye exam for diabetics  02/04/2023   Yearly kidney health urinalysis for diabetes  03/23/2023   Hemoglobin A1C  04/08/2023   Yearly kidney function blood test for diabetes  06/24/2023   Complete foot exam   10/06/2023   Medicare Annual Wellness Visit  12/08/2023   Colon Cancer Screening  10/26/2027   Pneumonia Vaccine  Completed   Zoster (Shingles) Vaccine  Completed   HPV Vaccine  Aged Out   Hepatitis C Screening  Discontinued  *Topic was postponed. The date shown is not the original due date.    Advanced directives: (In Chart) A copy of your advanced directives are scanned into your chart should your provider ever need it.  Next Medicare Annual Wellness Visit scheduled for next year: Yes  Preventive Care 33 Years and Older, Female Preventive care refers to lifestyle choices and visits with your health care provider that can promote health and wellness. What does preventive care include? A yearly physical exam. This is also called an annual well check. Dental exams once or twice a year. Routine eye exams. Ask your health care provider how often you should have your eyes checked. Personal lifestyle choices, including: Daily care of your teeth and gums. Regular  physical activity. Eating a healthy diet. Avoiding tobacco and drug use. Limiting alcohol use. Practicing safe sex. Taking low-dose aspirin every day. Taking vitamin and mineral supplements as recommended by your health care provider. What happens during an annual well check? The services and screenings done by your health care provider during your annual well check will depend on your age, overall health, lifestyle risk factors, and family history of disease. Counseling  Your health care provider may ask you questions about your: Alcohol use. Tobacco use. Drug use. Emotional well-being. Home and relationship well-being. Sexual activity. Eating habits. History of falls. Memory and ability to understand (cognition). Work and work Astronomer. Reproductive health. Screening  You may have the following tests or measurements: Height, weight, and BMI. Blood pressure. Lipid and cholesterol levels. These may be checked every 5 years, or more frequently if you are over 32 years old. Skin check. Lung cancer screening. You may have this screening every year starting at age 39 if you have a 30-pack-year history of smoking and currently smoke or have quit within the past 15 years. Fecal occult blood test (FOBT) of the stool. You may have this test every year starting at age 78. Flexible sigmoidoscopy or colonoscopy. You may have a sigmoidoscopy every 5 years or a colonoscopy every 10 years starting at age 34. Hepatitis C blood test. Hepatitis B blood test. Sexually transmitted disease (STD) testing. Diabetes screening. This is done by checking your blood sugar (glucose) after you have not eaten for  a while (fasting). You may have this done every 1-3 years. Bone density scan. This is done to screen for osteoporosis. You may have this done starting at age 7. Mammogram. This may be done every 1-2 years. Talk to your health care provider about how often you should have regular mammograms. Talk  with your health care provider about your test results, treatment options, and if necessary, the need for more tests. Vaccines  Your health care provider may recommend certain vaccines, such as: Influenza vaccine. This is recommended every year. Tetanus, diphtheria, and acellular pertussis (Tdap, Td) vaccine. You may need a Td booster every 10 years. Zoster vaccine. You may need this after age 31. Pneumococcal 13-valent conjugate (PCV13) vaccine. One dose is recommended after age 74. Pneumococcal polysaccharide (PPSV23) vaccine. One dose is recommended after age 2. Talk to your health care provider about which screenings and vaccines you need and how often you need them. This information is not intended to replace advice given to you by your health care provider. Make sure you discuss any questions you have with your health care provider. Document Released: 05/23/2015 Document Revised: 01/14/2016 Document Reviewed: 02/25/2015 Elsevier Interactive Patient Education  2017 [employer].  Fall Prevention in the Home Falls can cause injuries. They can happen to people of all ages. There are many things you can do to make your home safe and to help prevent falls. What can I do on the outside of my home? Regularly fix the edges of walkways and driveways and fix any cracks. Remove anything that might make you trip as you walk through a door, such as a raised step or threshold. Trim any bushes or trees on the path to your home. Use bright outdoor lighting. Clear any walking paths of anything that might make someone trip, such as rocks or tools. Regularly check to see if handrails are loose or broken. Make sure that both sides of any steps have handrails. Any raised decks and porches should have guardrails on the edges. Have any leaves, snow, or ice cleared regularly. Use sand or salt on walking paths during winter. Clean up any spills in your garage right away. This includes oil or grease  spills. What can I do in the bathroom? Use night lights. Install grab bars by the toilet and in the tub and shower. Do not use towel bars as grab bars. Use non-skid mats or decals in the tub or shower. If you need to sit down in the shower, use a plastic, non-slip stool. Keep the floor dry. Clean up any water that spills on the floor as soon as it happens. Remove soap buildup in the tub or shower regularly. Attach bath mats securely with double-sided non-slip rug tape. Do not have throw rugs and other things on the floor that can make you trip. What can I do in the bedroom? Use night lights. Make sure that you have a light by your bed that is easy to reach. Do not use any sheets or blankets that are too big for your bed. They should not hang down onto the floor. Have a firm chair that has side arms. You can use this for support while you get dressed. Do not have throw rugs and other things on the floor that can make you trip. What can I do in the kitchen? Clean up any spills right away. Avoid walking on wet floors. Keep items that you use a lot in easy-to-reach places. If you need to reach something above  you, use a strong step stool that has a grab bar. Keep electrical cords out of the way. Do not use floor polish or wax that makes floors slippery. If you must use wax, use non-skid floor wax. Do not have throw rugs and other things on the floor that can make you trip. What can I do with my stairs? Do not leave any items on the stairs. Make sure that there are handrails on both sides of the stairs and use them. Fix handrails that are broken or loose. Make sure that handrails are as long as the stairways. Check any carpeting to make sure that it is firmly attached to the stairs. Fix any carpet that is loose or worn. Avoid having throw rugs at the top or bottom of the stairs. If you do have throw rugs, attach them to the floor with carpet tape. Make sure that you have a light switch at the  top of the stairs and the bottom of the stairs. If you do not have them, ask someone to add them for you. What else can I do to help prevent falls? Wear shoes that: Do not have high heels. Have rubber bottoms. Are comfortable and fit you well. Are closed at the toe. Do not wear sandals. If you use a stepladder: Make sure that it is fully opened. Do not climb a closed stepladder. Make sure that both sides of the stepladder are locked into place. Ask someone to hold it for you, if possible. Clearly mark and make sure that you can see: Any grab bars or handrails. First and last steps. Where the edge of each step is. Use tools that help you move around (mobility aids) if they are needed. These include: Canes. Walkers. Scooters. Crutches. Turn on the lights when you go into a dark area. Replace any light bulbs as soon as they burn out. Set up your furniture so you have a clear path. Avoid moving your furniture around. If any of your floors are uneven, fix them. If there are any pets around you, be aware of where they are. Review your medicines with your doctor. Some medicines can make you feel dizzy. This can increase your chance of falling. Ask your doctor what other things that you can do to help prevent falls. This information is not intended to replace advice given to you by your health care provider. Make sure you discuss any questions you have with your health care provider. Document Released: 02/20/2009 Document Revised: 10/02/2015 Document Reviewed: 05/31/2014 Elsevier Interactive Patient Education  2017 [employer].

## 2022-12-27 ENCOUNTER — Encounter: Payer: Self-pay | Admitting: Family

## 2022-12-27 ENCOUNTER — Ambulatory Visit (INDEPENDENT_AMBULATORY_CARE_PROVIDER_SITE_OTHER): Payer: PPO

## 2022-12-27 ENCOUNTER — Ambulatory Visit (INDEPENDENT_AMBULATORY_CARE_PROVIDER_SITE_OTHER): Payer: PPO | Admitting: Family

## 2022-12-27 VITALS — BP 116/76 | HR 72 | Temp 97.2°F | Ht 64.0 in | Wt 221.8 lb

## 2022-12-27 DIAGNOSIS — R6889 Other general symptoms and signs: Secondary | ICD-10-CM

## 2022-12-27 DIAGNOSIS — Z20828 Contact with and (suspected) exposure to other viral communicable diseases: Secondary | ICD-10-CM | POA: Diagnosis not present

## 2022-12-27 DIAGNOSIS — R051 Acute cough: Secondary | ICD-10-CM

## 2022-12-27 DIAGNOSIS — R059 Cough, unspecified: Secondary | ICD-10-CM | POA: Diagnosis not present

## 2022-12-27 DIAGNOSIS — I700 Atherosclerosis of aorta: Secondary | ICD-10-CM | POA: Diagnosis not present

## 2022-12-27 LAB — VERITOR FLU A/B WAIVED
Influenza A: NEGATIVE
Influenza B: NEGATIVE

## 2022-12-27 MED ORDER — PREDNISONE 10 MG (21) PO TBPK
ORAL_TABLET | ORAL | 0 refills | Status: DC
Start: 2022-12-27 — End: 2023-01-06

## 2022-12-27 MED ORDER — BENZONATATE 200 MG PO CAPS
200.0000 mg | ORAL_CAPSULE | Freq: Three times a day (TID) | ORAL | 1 refills | Status: DC | PRN
Start: 2022-12-27 — End: 2023-01-06

## 2022-12-27 MED ORDER — OSELTAMIVIR PHOSPHATE 75 MG PO CAPS
75.0000 mg | ORAL_CAPSULE | Freq: Two times a day (BID) | ORAL | 0 refills | Status: DC
Start: 2022-12-27 — End: 2022-12-27

## 2022-12-27 NOTE — Patient Instructions (Signed)

## 2022-12-27 NOTE — Progress Notes (Signed)
Subjective:    Patient ID: Karen Potter, female    DOB: 09/09/1940, 82 y.o.   MRN: [id number]  Chief Complaint  Patient presents with   Cough   Nasal Congestion   PT presents to the office today with flu like symptoms that started three days ago. Her daughter was diagnosed with parainfluenza this weekend.  Cough This is a new problem. The current episode started in the past 7 days. The problem has been gradually worsening. The problem occurs every few minutes. The cough is Productive of sputum. Associated symptoms include headaches, nasal congestion, shortness of breath and wheezing. Pertinent negatives include no chills, ear congestion, ear pain, fever, myalgias or sweats. She has tried rest for the symptoms. The treatment provided mild relief.      Review of Systems  Constitutional:  Negative for chills and fever.  HENT:  Negative for ear pain.   Respiratory:  Positive for cough, shortness of breath and wheezing.   Musculoskeletal:  Negative for myalgias.  Neurological:  Positive for headaches.  All other systems reviewed and are negative.      Objective:   Physical Exam Vitals reviewed.  Constitutional:      General: She is not in acute distress.    Appearance: She is well-developed.  HENT:     Head: Normocephalic and atraumatic.     Right Ear: Tympanic membrane normal.     Left Ear: Tympanic membrane normal.  Eyes:     Pupils: Pupils are equal, round, and reactive to light.  Neck:     Thyroid: No thyromegaly.  Cardiovascular:     Rate and Rhythm: Normal rate and regular rhythm.     Heart sounds: Normal heart sounds. No murmur heard. Pulmonary:     Effort: Pulmonary effort is normal. No respiratory distress.     Breath sounds: Normal breath sounds. No wheezing.     Comments: Constant nonproductive  barking cough Abdominal:     General: Bowel sounds are normal. There is no distension.     Palpations: Abdomen is soft.     Tenderness: There is no abdominal  tenderness.  Musculoskeletal:        General: No tenderness. Normal range of motion.     Cervical back: Normal range of motion and neck supple.  Skin:    General: Skin is warm and dry.  Neurological:     Mental Status: She is alert and oriented to person, place, and time.     Cranial Nerves: No cranial nerve deficit.     Deep Tendon Reflexes: Reflexes are normal and symmetric.  Psychiatric:        Behavior: Behavior normal.        Thought Content: Thought content normal.        Judgment: Judgment normal.          BP 116/76   Pulse 72   Temp (!) 97.2 F (36.2 C) (Temporal)   Ht 5\' 4"  (1.626 m)   Wt 221 lb 12.8 oz (100.6 kg)   SpO2 97%   BMI 38.07 kg/m   Assessment & Plan:   Karen Potter comes in today with chief complaint of Cough and Nasal Congestion   Diagnosis and orders addressed:  1. Acute cough - COVID-19, Flu A+B and RSV - Veritor Flu A/B Waived - predniSONE (STERAPRED UNI-PAK 21 TAB) 10 MG (21) TBPK tablet; Use as directed  Dispense: 21 tablet; Refill: 0 - benzonatate (TESSALON) 200 MG capsule; Take 1 capsule (200  mg total) by mouth 3 (three) times daily as needed.  Dispense: 30 capsule; Refill: 1 - DG Chest 2 View  2. Flu-like symptoms - predniSONE (STERAPRED UNI-PAK 21 TAB) 10 MG (21) TBPK tablet; Use as directed  Dispense: 21 tablet; Refill: 0 - benzonatate (TESSALON) 200 MG capsule; Take 1 capsule (200 mg total) by mouth 3 (three) times daily as needed.  Dispense: 30 capsule; Refill: 1 - DG Chest 2 View - Respiratory Panel w/ SARS-CoV2  3. Exposure to influenza   Rest Force fluids Start prednisone Tessalon as needed Monitor O2 saturation at home, let us know if <95%, go to ED if 90% or less.  Cough medication as needed  Jannifer Rodney, FNP

## 2022-12-28 LAB — RESPIRATORY PANEL W/ SARS-COV2

## 2022-12-30 NOTE — Telephone Encounter (Signed)
Courtney,  Do you do this? I just haven't seen it before.

## 2023-01-06 ENCOUNTER — Ambulatory Visit (INDEPENDENT_AMBULATORY_CARE_PROVIDER_SITE_OTHER): Payer: PPO

## 2023-01-06 ENCOUNTER — Encounter: Payer: Self-pay | Admitting: Family Medicine

## 2023-01-06 ENCOUNTER — Ambulatory Visit (INDEPENDENT_AMBULATORY_CARE_PROVIDER_SITE_OTHER): Payer: PPO | Admitting: Family Medicine

## 2023-01-06 VITALS — BP 117/74 | HR 72 | Ht 64.0 in | Wt 220.0 lb

## 2023-01-06 DIAGNOSIS — E1169 Type 2 diabetes mellitus with other specified complication: Secondary | ICD-10-CM

## 2023-01-06 DIAGNOSIS — E039 Hypothyroidism, unspecified: Secondary | ICD-10-CM

## 2023-01-06 DIAGNOSIS — M85832 Other specified disorders of bone density and structure, left forearm: Secondary | ICD-10-CM | POA: Diagnosis not present

## 2023-01-06 DIAGNOSIS — I152 Hypertension secondary to endocrine disorders: Secondary | ICD-10-CM | POA: Diagnosis not present

## 2023-01-06 DIAGNOSIS — Z7984 Long term (current) use of oral hypoglycemic drugs: Secondary | ICD-10-CM

## 2023-01-06 DIAGNOSIS — E1159 Type 2 diabetes mellitus with other circulatory complications: Secondary | ICD-10-CM | POA: Diagnosis not present

## 2023-01-06 DIAGNOSIS — E785 Hyperlipidemia, unspecified: Secondary | ICD-10-CM | POA: Diagnosis not present

## 2023-01-06 DIAGNOSIS — K219 Gastro-esophageal reflux disease without esophagitis: Secondary | ICD-10-CM | POA: Diagnosis not present

## 2023-01-06 DIAGNOSIS — Z780 Asymptomatic menopausal state: Secondary | ICD-10-CM

## 2023-01-06 LAB — BAYER DCA HB A1C WAIVED: HB A1C (BAYER DCA - WAIVED): 7 % — ABNORMAL HIGH (ref 4.8–5.6)

## 2023-01-06 MED ORDER — CARVEDILOL 6.25 MG PO TABS
6.2500 mg | ORAL_TABLET | Freq: Two times a day (BID) | ORAL | 3 refills | Status: DC
Start: 2023-01-06 — End: 2023-11-08

## 2023-01-06 MED ORDER — FLUTICASONE PROPIONATE 50 MCG/ACT NA SUSP: 1.0000 | Freq: Two times a day (BID) | NASAL | 1 refills | Status: DC | PRN

## 2023-01-06 MED ORDER — PANTOPRAZOLE SODIUM 40 MG PO TBEC
40.0000 mg | DELAYED_RELEASE_TABLET | Freq: Every day | ORAL | 3 refills | Status: DC
Start: 2023-01-06 — End: 2023-11-08

## 2023-01-06 NOTE — Progress Notes (Signed)
BP 117/74   Pulse 72   Ht 5\' 4"  (1.626 m)   Wt 220 lb (99.8 kg)   SpO2 97%   BMI 37.76 kg/m    Subjective:   Patient ID: Karen Potter, female    DOB: Feb 17, 1941, 82 y.o.   MRN: [id number]  HPI: Karen Potter is a 82 y.o. female presenting on 01/06/2023 for Medical Management of Chronic Issues, Hypertension, and Diabetes   HPI Type 2 diabetes mellitus Patient comes in today for recheck of his diabetes. Patient has been currently taking [place] and [place]. Patient is currently on an ACE inhibitor/ARB. Patient has not seen an ophthalmologist this year. Patient denies any new issues with their feet. The symptom started onset as an adult hypertension and hyperlipidemia hypothyroidism and diabetic neuropathy ARE RELATED TO DM   Hypertension Patient is currently on losartan and carvedilol, and their blood pressure today is 117/74. Patient denies any lightheadedness or dizziness. Patient denies headaches, blurred vision, chest pains, shortness of breath, or weakness. Denies any side effects from medication and is content with current medication.   Hyperlipidemia Patient is coming in for recheck of his hyperlipidemia. The patient is currently taking Crestor. They deny any issues with myalgias or history of liver damage from it. They deny any focal numbness or weakness or chest pain.   Hypothyroidism recheck Patient is coming in for thyroid recheck today as well. They deny any issues with hair changes or heat or cold problems or diarrhea or constipation. They deny any chest pain or palpitations. They are currently on levothyroxine 137 micrograms   GERD Patient is currently on pantoprazole.  She denies any major symptoms or abdominal pain or belching or burping. She denies any blood in her stool or lightheadedness or dizziness.   Relevant past medical, surgical, family and social history reviewed and updated as indicated. Interim medical history since our last visit reviewed. Allergies  and medications reviewed and updated.  Review of Systems  Constitutional:  Negative for chills and fever.  HENT:  Positive for congestion (Residual from a recent cold).   Eyes:  Negative for redness and visual disturbance.  Respiratory:  Negative for chest tightness and shortness of breath.   Cardiovascular:  Negative for chest pain and leg swelling.  Genitourinary:  Negative for difficulty urinating and dysuria.  Musculoskeletal:  Negative for back pain and gait problem.  Skin:  Negative for rash.  Neurological:  Negative for dizziness, light-headedness and headaches.  Psychiatric/Behavioral:  Negative for agitation and behavioral problems.   All other systems reviewed and are negative.   Per HPI unless specifically indicated above   Allergies as of 01/06/2023       Reactions   Codeine Itching   Dilaudid [hydromorphone] Other (See Comments)   Mouth blisters   Other Itching   Most pain meds cause itching.  When has to take pain medication, she has been instructed to take Benadryl   Hydrocodone-acetaminophen Other (See Comments)   Doxycycline Rash, Other (See Comments)        Medication List        Accurate as of January 06, 2023 11:00 AM. If you have any questions, ask your nurse or doctor.          STOP taking these medications    benzonatate 200 MG capsule Commonly known as: TESSALON Stopped by: Elige Radon Ericson Nafziger   predniSONE 10 MG (21) Tbpk tablet Commonly known as: STERAPRED UNI-PAK 21 TAB Stopped by: Elige Radon Leon Goodnow  TAKE these medications    acetaminophen 650 MG CR tablet Commonly known as: TYLENOL Take 650 mg by mouth in the morning.   albuterol 108 (90 Base) MCG/ACT inhaler Commonly known as: VENTOLIN HFA Inhale 2 puffs into the lungs every 6 (six) hours as needed for wheezing or shortness of breath.   allopurinol 100 MG tablet Commonly known as: ZYLOPRIM Take 1 tablet (100 mg total) by mouth daily.   Blood Glucose Monitor System  w/Device Kit Use glucose meter to check blood sugar up to 3 times daily as instructed. One Touch Verio preferred by insurance. 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   clobetasol ointment 0.05 % Commonly known as: TEMOVATE Apply 1 Application topically 2 (two) times daily.   clopidogrel 75 MG tablet Commonly known as: PLAVIX Take 1 tablet (75 mg total) by mouth every evening.   clotrimazole-betamethasone cream Commonly known as: LOTRISONE Apply 1 Application topically daily.   dapagliflozin propanediol 10 MG Tabs tablet Commonly known as: FARXIGA Take 1 tablet (10 mg total) by mouth daily before breakfast.   Elderberry 500 MG Caps Take 500 mg by mouth daily.   Fish Oil Triple Strength 1400 MG Caps Take 1,000 mg by mouth 4 (four) times daily.   fluticasone 50 MCG/ACT nasal spray Commonly known as: FLONASE Place 1 spray into both nostrils 2 (two) times daily as needed for allergies or rhinitis. Started by: Elige Radon Saima Monterroso   furosemide 40 MG tablet Commonly known as: LASIX Take 1 tablet (40 mg total) by mouth daily.   L-FORMULA LYSINE HCL PO Take 1 Dose by mouth daily.   Lancets Misc. Kit Use patient & insurance preferred lancet device to test blood glucose up to 3 times daily for Dx: E11.69   levocetirizine 5 MG tablet Commonly known as: XYZAL Take 0.5 tablets (2.5 mg total) by mouth every evening.   levothyroxine 137 MCG tablet Commonly known as: SYNTHROID TAKE 1 TABLET BY MOUTH ONCE DAILY BEFORE BREAKFAST   losartan 25 MG tablet Commonly known as: COZAAR Take 1 tablet (25 mg total) by mouth daily.   nystatin powder Commonly known as: MYCOSTATIN/NYSTOP Apply 1 Application topically 3 (three) times daily.   OneTouch Verio test strip Generic drug: glucose blood USE 1 STRIP TO CHECK GLUCOSE UP TO THREE TIMES DAILY AS  INSTRUCTED   pantoprazole 40 MG  tablet Commonly known as: PROTONIX Take 1 tablet (40 mg total) by mouth daily.   rosuvastatin 5 MG tablet Commonly known as: Crestor Take 1 tablet (5 mg total) by mouth daily.   sitaGLIPtin 100 MG tablet Commonly known as: Januvia Take 1 tablet (100 mg total) by mouth daily.   Vitamin D3 50 MCG (2000 UT) capsule Take 2,000 Units by mouth 2 (two) times daily.         Objective:   BP 117/74   Pulse 72   Ht 5\' 4"  (1.626 m)   Wt 220 lb (99.8 kg)   SpO2 97%   BMI 37.76 kg/m   Wt Readings from Last 3 Encounters:  01/06/23 220 lb (99.8 kg)  12/27/22 221 lb 12.8 oz (100.6 kg)  12/08/22 225 lb (102.1 kg)    Physical Exam Vitals and nursing note reviewed.  Constitutional:      General: She is not in acute distress.    Appearance: She is well-developed. She is not diaphoretic.  HENT:  Mouth/Throat:     Mouth: Mucous membranes are moist.     Pharynx: Oropharynx is clear. No oropharyngeal exudate or posterior oropharyngeal erythema.  Eyes:     Conjunctiva/sclera: Conjunctivae normal.  Cardiovascular:     Rate and Rhythm: Normal rate and regular rhythm.     Heart sounds: Normal heart sounds. No murmur heard. Pulmonary:     Effort: Pulmonary effort is normal. No respiratory distress.     Breath sounds: Normal breath sounds. No wheezing.  Skin:    General: Skin is warm and dry.     Findings: No rash.  Neurological:     Mental Status: She is alert and oriented to person, place, and time.     Coordination: Coordination normal.  Psychiatric:        Behavior: Behavior normal.       Assessment & Plan:   Problem List Items Addressed This Visit       Cardiovascular and Mediastinum   Hypertension associated with diabetes (HCC)   Relevant Medications   carvedilol (COREG) 6.25 MG tablet     Digestive   GERD (gastroesophageal reflux disease) (Chronic)   Relevant Medications   pantoprazole (PROTONIX) 40 MG tablet     Endocrine   Hyperlipidemia associated with  type 2 diabetes mellitus (HCC)   Relevant Medications   carvedilol (COREG) 6.25 MG tablet   Hypothyroid   Relevant Medications   carvedilol (COREG) 6.25 MG tablet   Other Relevant Orders   CBC with Differential/Platelet   CMP14+EGFR   Lipid panel   Bayer DCA Hb A1c Waived   TSH   Type 2 diabetes mellitus with other specified complication (HCC) - Primary   Relevant Medications   carvedilol (COREG) 6.25 MG tablet   Other Relevant Orders   CBC with Differential/Platelet   CMP14+EGFR   Lipid panel   Bayer DCA Hb A1c Waived   TSH    A1c is 7.0, no changes, likely up slightly because of steroids for congestion send Flonase and recommended Benadryl at night Follow up plan: Return in about 3 months (around 04/08/2023), or if symptoms worsen or fail to improve, for Diabetes and hypertension and hypothyroidism.  Counseling provided for all of the vaccine components Orders Placed This Encounter  Procedures   CBC with Differential/Platelet   CMP14+EGFR   Lipid panel   Bayer DCA Hb A1c Waived   TSH    Arville Care, MD [facility] 01/06/2023, 11:00 AM

## 2023-01-07 LAB — CMP14+EGFR
ALT: 18 IU/L (ref 0–32)
AST: 12 IU/L (ref 0–40)
Albumin: 3.6 g/dL — ABNORMAL LOW (ref 3.7–4.7)
Alkaline Phosphatase: 83 IU/L (ref 44–121)
BUN/Creatinine Ratio: 10 — ABNORMAL LOW (ref 12–28)
BUN: 8 mg/dL (ref 8–27)
Bilirubin Total: 0.6 mg/dL (ref 0.0–1.2)
CO2: 24 mmol/L (ref 20–29)
Calcium: 9 mg/dL (ref 8.7–10.3)
Chloride: 103 mmol/L (ref 96–106)
Creatinine, Ser: 0.77 mg/dL (ref 0.57–1.00)
Globulin, Total: 2.7 g/dL (ref 1.5–4.5)
Glucose: 144 mg/dL — ABNORMAL HIGH (ref 70–99)
Potassium: 3.3 mmol/L — ABNORMAL LOW (ref 3.5–5.2)
Sodium: 142 mmol/L (ref 134–144)
Total Protein: 6.3 g/dL (ref 6.0–8.5)
eGFR: 77 mL/min/{1.73_m2} (ref >59)

## 2023-01-07 LAB — CBC WITH DIFFERENTIAL/PLATELET
Basophils Absolute: 0.1 10*3/uL (ref 0.0–0.2)
Basos: 1 %
EOS (ABSOLUTE): 0.4 10*3/uL (ref 0.0–0.4)
Eos: 4 %
Hematocrit: 44.3 % (ref 34.0–46.6)
Hemoglobin: 14.4 g/dL (ref 11.1–15.9)
Immature Grans (Abs): 0.2 10*3/uL — ABNORMAL HIGH (ref 0.0–0.1)
Immature Granulocytes: 2 %
Lymphocytes Absolute: 2.8 10*3/uL (ref 0.7–3.1)
Lymphs: 26 %
MCH: 28.9 pg (ref 26.6–33.0)
MCHC: 32.5 g/dL (ref 31.5–35.7)
MCV: 89 fL (ref 79–97)
Monocytes Absolute: 1.6 10*3/uL — ABNORMAL HIGH (ref 0.1–0.9)
Monocytes: 15 %
Neutrophils Absolute: 5.6 10*3/uL (ref 1.4–7.0)
Neutrophils: 52 %
Platelets: 264 10*3/uL (ref 150–450)
RBC: 4.99 x10E6/uL (ref 3.77–5.28)
RDW: 13.8 % (ref 11.7–15.4)
WBC: 10.6 10*3/uL (ref 3.4–10.8)

## 2023-01-07 LAB — LIPID PANEL
Chol/HDL Ratio: 3.3 ratio (ref 0.0–4.4)
Cholesterol, Total: 116 mg/dL (ref 100–199)
HDL: 35 mg/dL — ABNORMAL LOW (ref >39)
LDL Chol Calc (NIH): 46 mg/dL (ref 0–99)
Triglycerides: 221 mg/dL — ABNORMAL HIGH (ref 0–149)
VLDL Cholesterol Cal: 35 mg/dL (ref 5–40)

## 2023-01-07 LAB — TSH: TSH: 0.225 u[IU]/mL — ABNORMAL LOW (ref 0.450–4.500)

## 2023-01-13 ENCOUNTER — Other Ambulatory Visit: Payer: Self-pay

## 2023-01-13 MED ORDER — LEVOTHYROXINE SODIUM 125 MCG PO TABS: 125.0000 ug | ORAL_TABLET | Freq: Every day | ORAL | 1 refills | Status: DC

## 2023-01-20 ENCOUNTER — Encounter: Payer: Self-pay | Admitting: *Deleted

## 2023-01-20 ENCOUNTER — Other Ambulatory Visit: Payer: PPO | Admitting: *Deleted

## 2023-01-20 ENCOUNTER — Telehealth: Payer: PPO

## 2023-01-20 NOTE — Patient Instructions (Signed)
Visit Information  Thank you for taking time to visit with me today. Please don't hesitate to contact me if I can be of assistance to you before our next scheduled telephone appointment.  Following are the goals we discussed today:   Goals Addressed             This Visit's Progress    COMPLETED: CCM (DIABETES) EXPECTED OUTCOME: MONITOR, SELF-MANAGE AND REDUCE SYMPTOMS OF DIABETES       Current Barriers:  Knowledge Deficits related to Diabetes management Care Coordination needs related to medications (currently working with pharmacist) in a patient with Diabetes Chronic Disease Management support and education needs related to Diabetes, diet Patient reports she has been checking CBG fasting only- with fasting range 100's, pt reports medications changes made a difference with AIC 7.0 on 01/06/23, [facility] pharmacist has been working with patient, pt is pleased with the progress she has made and the improvement in [facility] Patient reports she drinks water and soft drinks  Patient does not participate in exercise at present Patient states for patient assistance expires in November for [place] and she plans to continue working with pharmacist  Planned Interventions: Reviewed medications with patient and discussed importance of medication adherence;        Counseled on importance of regular laboratory monitoring as prescribed;        Advised patient, providing education and rationale, to check cbg twice daily and record        call provider for findings outside established parameters;       Review of patient status, including review of consultants reports, relevant laboratory and other test results, and medications completed;       Advised patient to discuss any issues with blood sugar, medications with provider;      Reviewed all upcoming scheduled appointments Reinforced carbohydrate modified diet In basket message sent to [facility] pharmacist reporting pt states she will need to reapply for  assistance in November for Farxiga and Januvia, case closure for RN care manager  Symptom Management: Take medications as prescribed   Attend all scheduled provider appointments Call pharmacy for medication refills 3-7 days in advance of running out of medications Attend church or other social activities Perform all self care activities independently  Perform IADL's (shopping, preparing meals, housekeeping, managing finances) independently Call provider office for new concerns or questions  check blood sugar at prescribed times: twice daily check feet daily for cuts, sores or redness enter blood sugar readings and medication or insulin into daily log take the blood sugar log to all doctor visits take the blood sugar meter to all doctor visits trim toenails straight across fill half of plate with vegetables limit fast food meals to no more than 1 per week manage portion size prepare main meal at home 3 to 5 days each week read food labels for fat, fiber, carbohydrates and portion size set a realistic goal keep feet up while sitting Try to do some type of exercise daily even if for a few minutes Get outside daily Continue working with pharmacist as needed Try to slowly cut down on the soft drinks Case closure for nurse only, continue working with pharmacist as needed  Follow Up Plan:  Case closure for nurse       COMPLETED: CCM (HYPERTENSION) EXPECTED OUTCOME: MONITOR, SELF-MANAGE AND REDUCE SYMPTOMS OF HYPERTENSION       Current Barriers:  Knowledge Deficits related to Hypertension management Chronic Disease Management support and education needs related to  Hypertension, diet, exercise Patient reports she lives with spouse, is independent in all aspects of her care, continues to drive, does not exercise Patient reports she checks blood pressure on occasion and readings " are usually good" Patient reports she had PET scan/ cardiac imaging study, followed up with cardiologist and  findings unremarkable No new concerns reported  Planned Interventions: Evaluation of current treatment plan related to hypertension self management and patient's adherence to plan as established by provider;   Reviewed medications with patient and discussed importance of compliance;  Counseled on the importance of exercise goals with target of 150 minutes per week Advised patient, providing education and rationale, to monitor blood pressure daily and record, calling PCP for findings outside established parameters;  Advised patient to discuss any issues with blood pressure, medications/ side effects with provider; Discussed complications of poorly controlled blood pressure such as heart disease, stroke, circulatory complications, vision complications, kidney impairment, sexual dysfunction;  Reinforced importance of adherence to low sodium diet and reading food labels Reviewed plan of care including case closure  Symptom Management: Take medications as prescribed   Attend all scheduled provider appointments Call pharmacy for medication refills 3-7 days in advance of running out of medications Attend church or other social activities Perform all self care activities independently  Perform IADL's (shopping, preparing meals, housekeeping, managing finances) independently Call provider office for new concerns or questions  check blood pressure weekly choose a place to take my blood pressure (home, clinic or office, retail store) write blood pressure results in a log or diary learn about high blood pressure keep a blood pressure log call doctor for signs and symptoms of high blood pressure develop an action plan for high blood pressure keep all doctor appointments take medications for blood pressure exactly as prescribed begin an exercise program report new symptoms to your doctor eat more whole grains, fruits and vegetables, lean meats and healthy fats Follow low sodium diet- read food  labels for sodium content Limit fast food Case closure  Follow Up Plan: Case closure           Please call the care guide team at [phone] if you need to cancel or reschedule your appointment.   If you are experiencing a Mental Health or Behavioral Health Crisis or need someone to talk to, please call the Suicide and Crisis Lifeline: 988 call the [place] National Suicide Prevention Lifeline: 450 312 1243 or TTY: 4400335230 TTY 646-035-1495) to talk to a trained counselor call 1-800-273-TALK (toll free, 24 hour hotline) go to [facility] [address], [place] [phone]) call the [facility] Crisis Line: 415-015-2089 call 911   Patient verbalizes understanding of instructions and care plan provided today and agrees to view in MyChart. Active MyChart status and patient understanding of how to access instructions and care plan via MyChart confirmed with patient.     No further follow up required: case closure for nurse  Irving Shows [facility], BSN [facility]/ Ambulatory Care Management [phone]

## 2023-01-20 NOTE — Patient Outreach (Signed)
Care Management   Visit Note  01/20/2023 Name: Karen Potter MRN: [id number] DOB: [birth year]  Subjective: Karen Potter is a 82 y.o. year old female who is a primary care patient of Dettinger, Elige Radon, MD. The Care Management team was consulted for assistance.      Engaged with patient spoke with patient by telephone for follow up   Goals Addressed             This Visit's Progress    COMPLETED: CCM (DIABETES) EXPECTED OUTCOME: MONITOR, SELF-MANAGE AND REDUCE SYMPTOMS OF DIABETES       Current Barriers:  Knowledge Deficits related to Diabetes management Care Coordination needs related to medications (currently working with pharmacist) in a patient with Diabetes Chronic Disease Management support and education needs related to Diabetes, diet Patient reports she has been checking CBG fasting only- with fasting range 100's, pt reports medications changes made a difference with AIC 7.0 on 01/06/23, [facility] pharmacist has been working with patient, pt is pleased with the progress she has made and the improvement in [facility] Patient reports she drinks water and soft drinks  Patient does not participate in exercise at present Patient states for patient assistance expires in November for [place] and she plans to continue working with pharmacist  Planned Interventions: Reviewed medications with patient and discussed importance of medication adherence;        Counseled on importance of regular laboratory monitoring as prescribed;        Advised patient, providing education and rationale, to check cbg twice daily and record        call provider for findings outside established parameters;       Review of patient status, including review of consultants reports, relevant laboratory and other test results, and medications completed;       Advised patient to discuss any issues with blood sugar, medications with provider;      Reviewed all upcoming scheduled appointments Reinforced  carbohydrate modified diet In basket message sent to [facility] pharmacist reporting pt states she will need to reapply for assistance in November for Farxiga and Januvia, case closure for RN care manager  Symptom Management: Take medications as prescribed   Attend all scheduled provider appointments Call pharmacy for medication refills 3-7 days in advance of running out of medications Attend church or other social activities Perform all self care activities independently  Perform IADL's (shopping, preparing meals, housekeeping, managing finances) independently Call provider office for new concerns or questions  check blood sugar at prescribed times: twice daily check feet daily for cuts, sores or redness enter blood sugar readings and medication or insulin into daily log take the blood sugar log to all doctor visits take the blood sugar meter to all doctor visits trim toenails straight across fill half of plate with vegetables limit fast food meals to no more than 1 per week manage portion size prepare main meal at home 3 to 5 days each week read food labels for fat, fiber, carbohydrates and portion size set a realistic goal keep feet up while sitting Try to do some type of exercise daily even if for a few minutes Get outside daily Continue working with pharmacist as needed Try to slowly cut down on the soft drinks Case closure for nurse only, continue working with pharmacist as needed  Follow Up Plan:  Case closure for nurse       COMPLETED: CCM (HYPERTENSION) EXPECTED OUTCOME: MONITOR, SELF-MANAGE AND REDUCE SYMPTOMS OF HYPERTENSION  Current Barriers:  Knowledge Deficits related to Hypertension management Chronic Disease Management support and education needs related to Hypertension, diet, exercise Patient reports she lives with spouse, is independent in all aspects of her care, continues to drive, does not exercise Patient reports she checks blood pressure on occasion and  readings " are usually good" Patient reports she had PET scan/ cardiac imaging study, followed up with cardiologist and findings unremarkable No new concerns reported  Planned Interventions: Evaluation of current treatment plan related to hypertension self management and patient's adherence to plan as established by provider;   Reviewed medications with patient and discussed importance of compliance;  Counseled on the importance of exercise goals with target of 150 minutes per week Advised patient, providing education and rationale, to monitor blood pressure daily and record, calling PCP for findings outside established parameters;  Advised patient to discuss any issues with blood pressure, medications/ side effects with provider; Discussed complications of poorly controlled blood pressure such as heart disease, stroke, circulatory complications, vision complications, kidney impairment, sexual dysfunction;  Reinforced importance of adherence to low sodium diet and reading food labels Reviewed plan of care including case closure  Symptom Management: Take medications as prescribed   Attend all scheduled provider appointments Call pharmacy for medication refills 3-7 days in advance of running out of medications Attend church or other social activities Perform all self care activities independently  Perform IADL's (shopping, preparing meals, housekeeping, managing finances) independently Call provider office for new concerns or questions  check blood pressure weekly choose a place to take my blood pressure (home, clinic or office, retail store) write blood pressure results in a log or diary learn about high blood pressure keep a blood pressure log call doctor for signs and symptoms of high blood pressure develop an action plan for high blood pressure keep all doctor appointments take medications for blood pressure exactly as prescribed begin an exercise program report new symptoms to your  doctor eat more whole grains, fruits and vegetables, lean meats and healthy fats Follow low sodium diet- read food labels for sodium content Limit fast food Case closure  Follow Up Plan: Case closure           Plan: No further follow up required: Case closure for nurse, continue working with pharmacist as needed  Irving Shows [facility], BSN [facility]/ Ambulatory Care Management [phone]

## 2023-02-02 ENCOUNTER — Other Ambulatory Visit: Payer: Self-pay | Admitting: Pharmacist

## 2023-02-02 NOTE — Progress Notes (Signed)
Pharmacy Quality Measure Review  This patient is appearing on a report for being at risk of failing the adherence measure for hypertension (ACEi/ARB) medications this calendar year.   Medication: losartan 25 mg  Last fill date: 09/07/22 for 90 day supply  Called patient to discuss medications. She reports taking losartan 25 mg every day. Denies missed doses and utilizes a pill box to organize medications. Reports she is usually very on top of filling medications on time and will contact pharmacy to request refills when she is running low. Currently on vacation so does not have access to her medication bottles, only brought her pill box with her, but reports she will check when she gets home to make sure she has losartan. If not, will contact pharmacy to request refill of medication.  Jarrett Ables, PharmD PGY-1 Pharmacy Resident

## 2023-02-14 ENCOUNTER — Other Ambulatory Visit: Payer: Self-pay | Admitting: Family Medicine

## 2023-02-14 DIAGNOSIS — Z1231 Encounter for screening mammogram for malignant neoplasm of breast: Secondary | ICD-10-CM

## 2023-02-18 DIAGNOSIS — H35033 Hypertensive retinopathy, bilateral: Secondary | ICD-10-CM | POA: Diagnosis not present

## 2023-02-18 DIAGNOSIS — E119 Type 2 diabetes mellitus without complications: Secondary | ICD-10-CM | POA: Diagnosis not present

## 2023-02-18 DIAGNOSIS — H53143 Visual discomfort, bilateral: Secondary | ICD-10-CM | POA: Diagnosis not present

## 2023-02-18 DIAGNOSIS — I10 Essential (primary) hypertension: Secondary | ICD-10-CM | POA: Diagnosis not present

## 2023-02-23 MED ORDER — DAPAGLIFLOZIN PROPANEDIOL 10 MG PO TABS: 10.0000 mg | ORAL_TABLET | Freq: Every day | ORAL | 3 refills | Status: DC

## 2023-03-07 ENCOUNTER — Telehealth: Payer: Self-pay

## 2023-03-07 NOTE — Progress Notes (Signed)
Submitted RE-ENROLLMENT application for JANUVIA to [facility] for patient assistance.   Phone: [phone]

## 2023-03-14 NOTE — Telephone Encounter (Signed)
Received notification from [facility] regarding approval for JANUVIA. Patient assistance approved from 03/07/23 to 05/10/23.  Medication will ship to PATIENTS HOME  Pt ID: [id number]  Company phone: [phone]  Will call to verify enrollment date- application was for renewal for 2025.

## 2023-03-21 ENCOUNTER — Ambulatory Visit
Admission: RE | Admit: 2023-03-21 | Discharge: 2023-03-21 | Disposition: A | Discharge status: Home / Self Care | Payer: PPO | Source: Ambulatory Visit | Attending: Family Medicine | Admitting: Family Medicine

## 2023-03-21 DIAGNOSIS — Z1231 Encounter for screening mammogram for malignant neoplasm of breast: Secondary | ICD-10-CM | POA: Diagnosis not present

## 2023-03-23 NOTE — Telephone Encounter (Signed)
Spoke with [employer] regarding approval dates.  Per rep: renewals for 2025 will not be accepted until after 04/10/23.  Will resubmit online after 04/10/23.

## 2023-03-27 ENCOUNTER — Other Ambulatory Visit: Payer: Self-pay | Admitting: Family Medicine

## 2023-03-28 ENCOUNTER — Telehealth: Payer: Self-pay

## 2023-03-28 NOTE — Telephone Encounter (Signed)
Copied from CRM [id number]. Topic: Clinical - Medication Question >> Mar 28, 2023 12:35 PM Desma Mcgregor wrote: Reason for CRM: Pt requesting to speak with Raynelle Fanning the pharmacist regarding her medication, sitaGLIPtin (JANUVIA) 100 MG tablet. She's asking if the Rx is going to be renewed for next year. Cb# [id number]

## 2023-04-11 ENCOUNTER — Telehealth: Payer: Self-pay | Admitting: Family Medicine

## 2023-04-11 NOTE — Telephone Encounter (Signed)
Responded to patient via mychart

## 2023-04-11 NOTE — Telephone Encounter (Unsigned)
Copied from CRM [id number]. Topic: Clinical - Medication Question >> Apr 11, 2023  3:17 PM Dondra Prader A wrote: Reason for CRM: Pt is requesting to speak with Durward Mallard in regards to [facility]; she would like a status on medication.

## 2023-04-13 ENCOUNTER — Ambulatory Visit: Payer: PPO | Admitting: Family Medicine

## 2023-04-14 NOTE — Progress Notes (Deleted)
Pharmacy Medication Assistance Program Note    04/14/2023  Patient ID: Karen Potter, female  DOB: 08-13-1940, 82 y.o.  MRN:  [id number]     04/14/2023  Outreach Medication Two  Manufacturer Medication Two [employer]  [employer] Januvia  Dose of Januvia 100MG   Type of Journalist, newspaper to [employer]  Date Application Submitted to [employer] 04/14/2023      Renewal for 2025.

## 2023-04-18 ENCOUNTER — Encounter: Payer: Self-pay | Admitting: Family Medicine

## 2023-04-18 ENCOUNTER — Ambulatory Visit (INDEPENDENT_AMBULATORY_CARE_PROVIDER_SITE_OTHER): Payer: PPO | Admitting: Family Medicine

## 2023-04-18 VITALS — BP 121/78 | HR 53 | Ht 64.0 in | Wt 224.0 lb

## 2023-04-18 DIAGNOSIS — E1159 Type 2 diabetes mellitus with other circulatory complications: Secondary | ICD-10-CM | POA: Diagnosis not present

## 2023-04-18 DIAGNOSIS — E039 Hypothyroidism, unspecified: Secondary | ICD-10-CM

## 2023-04-18 DIAGNOSIS — E785 Hyperlipidemia, unspecified: Secondary | ICD-10-CM | POA: Diagnosis not present

## 2023-04-18 DIAGNOSIS — I152 Hypertension secondary to endocrine disorders: Secondary | ICD-10-CM | POA: Diagnosis not present

## 2023-04-18 DIAGNOSIS — E1169 Type 2 diabetes mellitus with other specified complication: Secondary | ICD-10-CM | POA: Diagnosis not present

## 2023-04-18 DIAGNOSIS — M609 Myositis, unspecified: Secondary | ICD-10-CM | POA: Diagnosis not present

## 2023-04-18 DIAGNOSIS — T466X5A Adverse effect of antihyperlipidemic and antiarteriosclerotic drugs, initial encounter: Secondary | ICD-10-CM

## 2023-04-18 DIAGNOSIS — E1140 Type 2 diabetes mellitus with diabetic neuropathy, unspecified: Secondary | ICD-10-CM | POA: Diagnosis not present

## 2023-04-18 LAB — BAYER DCA HB A1C WAIVED: HB A1C (BAYER DCA - WAIVED): 6.4 % — ABNORMAL HIGH (ref 4.8–5.6)

## 2023-04-18 NOTE — Progress Notes (Signed)
BP 121/78   Pulse (!) 53   Ht 5\' 4"  (1.626 m)   Wt 224 lb (101.6 kg)   SpO2 96%   BMI 38.45 kg/m    Subjective:   Patient ID: Karen Potter, female    DOB: [birth year], 82 y.o.   MRN: [id number]  HPI: Karen Potter is a 82 y.o. female presenting on 04/18/2023 for Medical Management of Chronic Issues, Diabetes, and Hypertension   HPI Type 2 diabetes mellitus Patient comes in today for recheck of his diabetes. Patient has been currently taking [place] and [place]. Patient is currently on an ACE inhibitor/ARB. Patient has not seen an ophthalmologist this year. Patient denies any new issues with their feet. The symptom started onset as an adult hypertension and hyperlipidemia ARE RELATED TO DM   Hypertension Patient is currently on carvedilol and furosemide and losartan, and their blood pressure today is 121/78. Patient denies any lightheadedness or dizziness. Patient denies headaches, blurred vision, chest pains, shortness of breath, or weakness. Denies any side effects from medication and is content with current medication.   Hyperlipidemia Patient is coming in for recheck of his hyperlipidemia. The patient is currently taking Crestor. They deny any issues with myalgias or history of liver damage from it. They deny any focal numbness or weakness or chest pain.   Hypothyroidism recheck Patient is coming in for thyroid recheck today as well. They deny any issues with hair changes or heat or cold problems or diarrhea or constipation. They deny any chest pain or palpitations. They are currently on levothyroxine 125 micrograms   Patient says she has been little more tired recently than a little more down but as far as the way she answered the depression questionnaire was mostly related to that.  She does not feel like she is actually depressed or having anxiety or want treatment for it.  Relevant past medical, surgical, family and social history reviewed and updated as indicated. Interim  medical history since our last visit reviewed. Allergies and medications reviewed and updated.  Review of Systems  Constitutional:  Positive for fatigue. Negative for chills and fever.  Eyes:  Negative for visual disturbance.  Respiratory:  Negative for chest tightness and shortness of breath.   Cardiovascular:  Negative for chest pain and leg swelling.  Genitourinary:  Negative for difficulty urinating and dysuria.  Skin:  Negative for rash.  Neurological:  Negative for dizziness, light-headedness and headaches.  Psychiatric/Behavioral:  Positive for dysphoric mood. Negative for agitation, behavioral problems, self-injury, sleep disturbance and suicidal ideas. The patient is nervous/anxious.   All other systems reviewed and are negative.   Per HPI unless specifically indicated above   Allergies as of 04/18/2023       Reactions   Codeine Itching   Dilaudid [hydromorphone] Other (See Comments)   Mouth blisters   Other Itching   Most pain meds cause itching.  When has to take pain medication, she has been instructed to take Benadryl   Hydrocodone-acetaminophen Other (See Comments)   Doxycycline Rash, Other (See Comments)        Medication List        Accurate as of April 18, 2023 11:54 AM. If you have any questions, ask your nurse or doctor.          STOP taking these medications    Fish Oil Triple Strength 1400 MG Caps Stopped by: Ivin Booty A Shareece Bultman       TAKE these medications    acetaminophen 650 MG  CR tablet Commonly known as: TYLENOL Take 650 mg by mouth in the morning.   albuterol 108 (90 Base) MCG/ACT inhaler Commonly known as: VENTOLIN HFA Inhale 2 puffs into the lungs every 6 (six) hours as needed for wheezing or shortness of breath.   allopurinol 100 MG tablet Commonly known as: ZYLOPRIM Take 1 tablet (100 mg total) by mouth daily.   Blood Glucose Monitor System w/Device Kit Use glucose meter to check blood sugar up to 3 times daily as  instructed. One Touch Verio preferred by insurance. 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   clobetasol ointment 0.05 % Commonly known as: TEMOVATE Apply 1 Application topically 2 (two) times daily.   clopidogrel 75 MG tablet Commonly known as: PLAVIX Take 1 tablet (75 mg total) by mouth every evening.   clotrimazole-betamethasone cream Commonly known as: LOTRISONE Apply 1 Application topically daily.   dapagliflozin propanediol 10 MG Tabs tablet Commonly known as: FARXIGA Take 1 tablet (10 mg total) by mouth daily before breakfast.   Elderberry 500 MG Caps Take 500 mg by mouth daily.   fluticasone 50 MCG/ACT nasal spray Commonly known as: FLONASE Place 1 spray into both nostrils 2 (two) times daily as needed for allergies or rhinitis.   furosemide 40 MG tablet Commonly known as: LASIX Take 1 tablet (40 mg total) by mouth daily.   L-FORMULA LYSINE HCL PO Take 1 Dose by mouth daily.   Lancets Misc. Kit Use patient & insurance preferred lancet device to test blood glucose up to 3 times daily for Dx: E11.69   levocetirizine 5 MG tablet Commonly known as: XYZAL Take 0.5 tablets (2.5 mg total) by mouth every evening.   levothyroxine 125 MCG tablet Commonly known as: SYNTHROID Take 1 tablet (125 mcg total) by mouth daily.   losartan 25 MG tablet Commonly known as: COZAAR Take 1 tablet (25 mg total) by mouth daily.   nystatin powder Commonly known as: MYCOSTATIN/NYSTOP Apply 1 Application topically 3 (three) times daily.   OneTouch Verio test strip Generic drug: glucose blood USE 1 STRIP TO CHECK GLUCOSE UP TO THREE TIMES DAILY AS  INSTRUCTED   pantoprazole 40 MG tablet Commonly known as: PROTONIX Take 1 tablet (40 mg total) by mouth daily.   rosuvastatin 5 MG tablet Commonly known as: Crestor Take 1 tablet (5 mg total) by mouth daily.   sitaGLIPtin 100 MG  tablet Commonly known as: Januvia Take 1 tablet (100 mg total) by mouth daily.   Vitamin D3 50 MCG (2000 UT) capsule Take 2,000 Units by mouth 2 (two) times daily.         Objective:   BP 121/78   Pulse (!) 53   Ht 5\' 4"  (1.626 m)   Wt 224 lb (101.6 kg)   SpO2 96%   BMI 38.45 kg/m   Wt Readings from Last 3 Encounters:  04/18/23 224 lb (101.6 kg)  01/06/23 220 lb (99.8 kg)  12/27/22 221 lb 12.8 oz (100.6 kg)    Physical Exam Vitals and nursing note reviewed.  Constitutional:      General: She is not in acute distress.    Appearance: She is well-developed. She is not diaphoretic.  Eyes:     Conjunctiva/sclera: Conjunctivae normal.  Cardiovascular:     Rate and Rhythm: Normal rate and regular rhythm.     Heart sounds: Normal heart sounds. No murmur  heard. Pulmonary:     Effort: Pulmonary effort is normal. No respiratory distress.     Breath sounds: Normal breath sounds. No wheezing, rhonchi or rales.  Musculoskeletal:        General: No swelling.  Skin:    General: Skin is warm and dry.     Findings: No rash.  Neurological:     Mental Status: She is alert and oriented to person, place, and time.     Coordination: Coordination normal.  Psychiatric:        Behavior: Behavior normal.       Assessment & Plan:   Problem List Items Addressed This Visit       Cardiovascular and Mediastinum   Hypertension associated with diabetes (HCC)     Endocrine   Hyperlipidemia associated with type 2 diabetes mellitus (HCC)   Hypothyroid   Relevant Orders   TSH   Bayer DCA Hb A1c Waived   Type 2 diabetes mellitus with other specified complication (HCC) - Primary   Relevant Orders   TSH   Bayer DCA Hb A1c Waived   Microalbumin / creatinine urine ratio   Type 2 diabetes mellitus with diabetic neuropathy, without long-term current use of insulin (HCC)     Musculoskeletal and Integument   Statin-induced myositis    A1c is 6.4 so looks a lot better.  Instructed her  with her decreased energy feeling somewhat weak in her legs that is likely due to deconditioning and recommended that she do regular exercise or therapy at home.  Also discussed the possibility of physical therapy but she does not want to do that at this point. Recheck.  Thyroid today as well because it was slightly off last time and see if that is affecting her energy as well. Follow up plan: Return in about 3 months (around 07/17/2023), or if symptoms worsen or fail to improve, for Diabetes and thyroid recheck.  Counseling provided for all of the vaccine components Orders Placed This Encounter  Procedures   TSH   Bayer DCA Hb A1c Waived   Microalbumin / creatinine urine ratio    Arville Care, MD [facility] 04/18/2023, 11:54 AM

## 2023-04-19 LAB — TSH: TSH: 0.381 u[IU]/mL — ABNORMAL LOW (ref 0.450–4.500)

## 2023-04-22 ENCOUNTER — Other Ambulatory Visit: Payer: Self-pay | Admitting: Family Medicine

## 2023-04-22 MED ORDER — LEVOTHYROXINE SODIUM 112 MCG PO TABS: 112.0000 ug | ORAL_TABLET | Freq: Every day | ORAL | 1 refills | Status: DC

## 2023-05-06 NOTE — Progress Notes (Signed)
Pharmacy Medication Assistance Program Note    05/06/2023  Patient ID: Karen Potter, female   DOB: 1941/03/23, 82 y.o.   MRN: [id number]     03/07/2023 03/14/2023  Outreach Medication One  Initial Outreach Date (Medication One) 01/24/2023   Manufacturer Medication One Merck   Mer[employer] Drugs Januvia   Dose of Januvia 100MG    Type of Radiographer, therapeutic Assistance   Name of Patent attorney to [employer]  Patient Assistance Determination  Approved  Approval Start Date  04/29/2023  Approval End Date  05/09/2024  Patient Notification Method MyChart

## 2023-06-14 ENCOUNTER — Other Ambulatory Visit: Payer: Self-pay | Admitting: Family Medicine

## 2023-06-14 DIAGNOSIS — B372 Candidiasis of skin and nail: Secondary | ICD-10-CM

## 2023-06-18 ENCOUNTER — Other Ambulatory Visit: Payer: Self-pay | Admitting: Family Medicine

## 2023-06-18 DIAGNOSIS — E1169 Type 2 diabetes mellitus with other specified complication: Secondary | ICD-10-CM

## 2023-07-17 NOTE — Progress Notes (Unsigned)
  Cardiology Office Note:   Date:  07/17/2023  ID:  Karen Potter Apr 15, 1941, MRN [id number] PCP: Dettinger, Elige Radon, MD  [facility] Providers Cardiologist:  Karen Rotunda, MD {  History of Present Illness:   Karen Potter is a 83 y.o. female who presents for evaluation of shortness of breath.  The patient did have an echo in May 2019 with a small pericardial effusion but normal EF.  This was done to evaluate cardiomegaly and cough.    She was in the hospital in October  2019 with sepsis related to Salmonella.     The patient was discharged from the hospital on June 15 2021 after an overnight stay.  She called EMS with aphasia and confusion.  Head CT was without acute abnormalities.  MRI and MRA of the head and neck demonstrated no acute findings.  She was treated with aspirin and Plavix.  After 21 days it was recommended that she switch to Plavix alone.  She was treated for UTI.  2D echocardiogram is pending at the time of discharge.  However, it does not appear that this was done.   The patient had a follow up echo with no significant abnormalities.    ***   ***She continues to have increasing shortness of breath.  She says she is short of breath walking less than 50 feet on level ground.  She has to stop several minutes to recover.  She is not describing PND or orthopnea.  She is not describing chest pressure, neck or arm discomfort.  She does not have any new palpitations, presyncope or syncope.  She is having trouble with her blood sugar not being well-controlled.  She is having problems with her hands turning white she says at times.  ROS: ***  Studies Reviewed:    EKG:       ***  Risk Assessment/Calculations:   {Does this patient have ATRIAL FIBRILLATION?:[id number]} No BP recorded.  {Refresh Note OR Click here to enter BP  :1}***        Physical Exam:   VS:  There were no vitals taken for this visit.   Wt Readings from Last 3 Encounters:  04/18/23 224 lb  (101.6 kg)  01/06/23 220 lb (99.8 kg)  12/27/22 221 lb 12.8 oz (100.6 kg)     GEN: Well nourished, well developed in no acute distress NECK: No JVD; No carotid bruits CARDIAC: ***RR, *** murmurs, rubs, gallops RESPIRATORY:  Clear to auscultation without rales, wheezing or rhonchi  ABDOMEN: Soft, non-tender, non-distended EXTREMITIES:  No edema; No deformity   ASSESSMENT AND PLAN:   SOB:   ***  I cannot exclude this as an anginal equivalent.  She needs screening for coronary artery disease but would not be able to walk on a treadmill.  I will order PET scanning   TIA: Echocardiogram was *** unremarkable.  She is being managed on Plavix per neurology.  No further workup.   ABNORMAL EKG: ***She had no significant arrhythmias on the monitor.  She has no symptoms related to bradycardia arrhythmia.  No further workup.   SLEEP APNEA:   *** She does use CPAP.       Follow up ***  Signed, Karen Rotunda, MD

## 2023-07-20 ENCOUNTER — Encounter: Payer: Self-pay | Admitting: Cardiology

## 2023-07-20 ENCOUNTER — Ambulatory Visit: Payer: PPO | Admitting: Cardiology

## 2023-07-20 VITALS — BP 100/70 | HR 74 | Ht 64.0 in | Wt 228.0 lb

## 2023-07-20 DIAGNOSIS — E1159 Type 2 diabetes mellitus with other circulatory complications: Secondary | ICD-10-CM | POA: Diagnosis not present

## 2023-07-20 DIAGNOSIS — R0602 Shortness of breath: Secondary | ICD-10-CM

## 2023-07-20 DIAGNOSIS — R9431 Abnormal electrocardiogram [ECG] [EKG]: Secondary | ICD-10-CM

## 2023-07-20 DIAGNOSIS — I152 Hypertension secondary to endocrine disorders: Secondary | ICD-10-CM

## 2023-07-20 DIAGNOSIS — G459 Transient cerebral ischemic attack, unspecified: Secondary | ICD-10-CM | POA: Diagnosis not present

## 2023-07-20 NOTE — Patient Instructions (Signed)
Medication Instructions:  The current medical regimen is effective;  continue present plan and medications.  *If you need a refill on your cardiac medications before your next appointment, please call your pharmacy*  Follow-Up: At [facility], you and your health needs are our priority.  As part of our continuing mission to provide you with exceptional heart care, we have created designated Provider Care Teams.  These Care Teams include your primary Cardiologist (physician) and Advanced Practice Providers (APPs -  Physician Assistants and Nurse Practitioners) who all work together to provide you with the care you need, when you need it.  We recommend signing up for the patient portal called "MyChart".  Sign up information is provided on this After Visit Summary.  MyChart is used to connect with patients for Virtual Visits (Telemedicine).  Patients are able to view lab/test results, encounter notes, upcoming appointments, etc.  Non-urgent messages can be sent to your provider as well.   To learn more about what you can do with MyChart, go to [email or web address].    Your next appointment:   Follow up as needed with Dr Percival Spanish

## 2023-07-27 ENCOUNTER — Other Ambulatory Visit: Payer: Self-pay

## 2023-07-27 ENCOUNTER — Emergency Department (HOSPITAL_COMMUNITY)

## 2023-07-27 ENCOUNTER — Observation Stay (HOSPITAL_COMMUNITY)

## 2023-07-27 ENCOUNTER — Observation Stay (HOSPITAL_COMMUNITY)
Admit: 2023-07-27 | Discharge: 2023-07-27 | Disposition: A | Discharge status: Home / Self Care | Attending: Emergency Medicine | Admitting: Emergency Medicine

## 2023-07-27 ENCOUNTER — Observation Stay (HOSPITAL_COMMUNITY)
Admission: EM | Admit: 2023-07-27 | Discharge: 2023-07-28 | Disposition: A | Discharge status: Home / Self Care | Attending: Internal Medicine | Admitting: Internal Medicine

## 2023-07-27 DIAGNOSIS — I10 Essential (primary) hypertension: Secondary | ICD-10-CM | POA: Insufficient documentation

## 2023-07-27 DIAGNOSIS — R569 Unspecified convulsions: Secondary | ICD-10-CM

## 2023-07-27 DIAGNOSIS — Z96651 Presence of right artificial knee joint: Secondary | ICD-10-CM | POA: Insufficient documentation

## 2023-07-27 DIAGNOSIS — G4733 Obstructive sleep apnea (adult) (pediatric): Secondary | ICD-10-CM | POA: Insufficient documentation

## 2023-07-27 DIAGNOSIS — R4182 Altered mental status, unspecified: Secondary | ICD-10-CM | POA: Diagnosis not present

## 2023-07-27 DIAGNOSIS — Z7902 Long term (current) use of antithrombotics/antiplatelets: Secondary | ICD-10-CM | POA: Insufficient documentation

## 2023-07-27 DIAGNOSIS — G936 Cerebral edema: Secondary | ICD-10-CM | POA: Diagnosis not present

## 2023-07-27 DIAGNOSIS — E1159 Type 2 diabetes mellitus with other circulatory complications: Secondary | ICD-10-CM | POA: Diagnosis not present

## 2023-07-27 DIAGNOSIS — Z79899 Other long term (current) drug therapy: Secondary | ICD-10-CM | POA: Insufficient documentation

## 2023-07-27 DIAGNOSIS — Z8673 Personal history of transient ischemic attack (TIA), and cerebral infarction without residual deficits: Secondary | ICD-10-CM | POA: Insufficient documentation

## 2023-07-27 DIAGNOSIS — I639 Cerebral infarction, unspecified: Secondary | ICD-10-CM | POA: Diagnosis present

## 2023-07-27 DIAGNOSIS — E119 Type 2 diabetes mellitus without complications: Secondary | ICD-10-CM | POA: Insufficient documentation

## 2023-07-27 DIAGNOSIS — R4701 Aphasia: Secondary | ICD-10-CM | POA: Diagnosis not present

## 2023-07-27 DIAGNOSIS — I152 Hypertension secondary to endocrine disorders: Secondary | ICD-10-CM | POA: Diagnosis not present

## 2023-07-27 DIAGNOSIS — E1169 Type 2 diabetes mellitus with other specified complication: Secondary | ICD-10-CM | POA: Diagnosis not present

## 2023-07-27 DIAGNOSIS — R000 Tachycardia, unspecified: Secondary | ICD-10-CM | POA: Diagnosis not present

## 2023-07-27 DIAGNOSIS — E039 Hypothyroidism, unspecified: Secondary | ICD-10-CM | POA: Diagnosis not present

## 2023-07-27 DIAGNOSIS — G459 Transient cerebral ischemic attack, unspecified: Secondary | ICD-10-CM | POA: Diagnosis not present

## 2023-07-27 DIAGNOSIS — R4781 Slurred speech: Secondary | ICD-10-CM | POA: Diagnosis not present

## 2023-07-27 DIAGNOSIS — R29818 Other symptoms and signs involving the nervous system: Secondary | ICD-10-CM | POA: Diagnosis not present

## 2023-07-27 DIAGNOSIS — I700 Atherosclerosis of aorta: Secondary | ICD-10-CM | POA: Diagnosis not present

## 2023-07-27 LAB — CBC
HCT: 42 % (ref 36.0–46.0)
Hemoglobin: 14 g/dL (ref 12.0–15.0)
MCH: 30.6 pg (ref 26.0–34.0)
MCHC: 33.3 g/dL (ref 30.0–36.0)
MCV: 91.9 fL (ref 80.0–100.0)
Platelets: 251 10*3/uL (ref 150–400)
RBC: 4.57 MIL/uL (ref 3.87–5.11)
RDW: 14.1 % (ref 11.5–15.5)
WBC: 7.1 10*3/uL (ref 4.0–10.5)
nRBC: 0 % (ref 0.0–0.2)

## 2023-07-27 LAB — I-STAT CHEM 8, ED
BUN: 11 mg/dL (ref 8–23)
Calcium, Ion: 1.15 mmol/L (ref 1.15–1.40)
Chloride: 102 mmol/L (ref 98–111)
Creatinine, Ser: 1 mg/dL (ref 0.44–1.00)
Glucose, Bld: 163 mg/dL — ABNORMAL HIGH (ref 70–99)
HCT: 43 % (ref 36.0–46.0)
Hemoglobin: 14.6 g/dL (ref 12.0–15.0)
Potassium: 3.4 mmol/L — ABNORMAL LOW (ref 3.5–5.1)
Sodium: 142 mmol/L (ref 135–145)
TCO2: 27 mmol/L (ref 22–32)

## 2023-07-27 LAB — COMPREHENSIVE METABOLIC PANEL
ALT: 17 U/L (ref 0–44)
AST: 19 U/L (ref 15–41)
Albumin: 3.4 g/dL — ABNORMAL LOW (ref 3.5–5.0)
Alkaline Phosphatase: 75 U/L (ref 38–126)
Anion gap: 10 (ref 5–15)
BUN: 12 mg/dL (ref 8–23)
CO2: 27 mmol/L (ref 22–32)
Calcium: 8.9 mg/dL (ref 8.9–10.3)
Chloride: 101 mmol/L (ref 98–111)
Creatinine, Ser: 0.97 mg/dL (ref 0.44–1.00)
GFR, Estimated: 59 mL/min — ABNORMAL LOW (ref >60)
Glucose, Bld: 165 mg/dL — ABNORMAL HIGH (ref 70–99)
Potassium: 3.4 mmol/L — ABNORMAL LOW (ref 3.5–5.1)
Sodium: 138 mmol/L (ref 135–145)
Total Bilirubin: 0.8 mg/dL (ref 0.0–1.2)
Total Protein: 6.9 g/dL (ref 6.5–8.1)

## 2023-07-27 LAB — DIFFERENTIAL
Abs Immature Granulocytes: 0.02 10*3/uL (ref 0.00–0.07)
Basophils Absolute: 0 10*3/uL (ref 0.0–0.1)
Basophils Relative: 0 %
Eosinophils Absolute: 0.2 10*3/uL (ref 0.0–0.5)
Eosinophils Relative: 2 %
Immature Granulocytes: 0 %
Lymphocytes Relative: 29 %
Lymphs Abs: 2.1 10*3/uL (ref 0.7–4.0)
Monocytes Absolute: 0.8 10*3/uL (ref 0.1–1.0)
Monocytes Relative: 11 %
Neutro Abs: 4 10*3/uL (ref 1.7–7.7)
Neutrophils Relative %: 58 %

## 2023-07-27 LAB — CBG MONITORING, ED: Glucose-Capillary: 153 mg/dL — ABNORMAL HIGH (ref 70–99)

## 2023-07-27 LAB — PROTIME-INR
INR: 1.1 (ref 0.8–1.2)
Prothrombin Time: 14.7 s (ref 11.4–15.2)

## 2023-07-27 LAB — ETHANOL: Alcohol, Ethyl (B): <10 mg/dL (ref <10)

## 2023-07-27 MED ORDER — SENNOSIDES-DOCUSATE SODIUM 8.6-50 MG PO TABS: 1.0000 | ORAL_TABLET | Freq: Every evening | ORAL | Status: DC | PRN

## 2023-07-27 MED ORDER — ACETAMINOPHEN 650 MG RE SUPP: 650.0000 mg | RECTAL | Status: DC | PRN

## 2023-07-27 MED ORDER — ROSUVASTATIN CALCIUM 5 MG PO TABS
5.0000 mg | ORAL_TABLET | Freq: Every day | ORAL | Status: DC
  Administered 2023-07-28: 5 mg via ORAL
  Filled 2023-07-27: qty 1

## 2023-07-27 MED ORDER — TRAMADOL HCL 50 MG PO TABS: 50.0000 mg | ORAL_TABLET | Freq: Four times a day (QID) | ORAL | Status: DC | PRN

## 2023-07-27 MED ORDER — ASPIRIN 81 MG PO TBEC
81.0000 mg | DELAYED_RELEASE_TABLET | Freq: Every day | ORAL | Status: DC
  Administered 2023-07-27: 81 mg via ORAL
  Filled 2023-07-27: qty 1

## 2023-07-27 MED ORDER — LEVOTHYROXINE SODIUM 112 MCG PO TABS
112.0000 ug | ORAL_TABLET | Freq: Every day | ORAL | Status: DC
  Administered 2023-07-28: 112 ug via ORAL
  Filled 2023-07-27: qty 1

## 2023-07-27 MED ORDER — HYDRALAZINE HCL 20 MG/ML IJ SOLN: 10.0000 mg | INTRAMUSCULAR | Status: DC | PRN

## 2023-07-27 MED ORDER — STROKE: EARLY STAGES OF RECOVERY BOOK
Freq: Once | Status: DC
  Filled 2023-07-27: qty 1

## 2023-07-27 MED ORDER — IOHEXOL 350 MG/ML SOLN
75.0000 mL | Freq: Once | INTRAVENOUS | Status: AC | PRN
  Administered 2023-07-27: 75 mL via INTRAVENOUS

## 2023-07-27 MED ORDER — PANTOPRAZOLE SODIUM 40 MG PO TBEC
40.0000 mg | DELAYED_RELEASE_TABLET | Freq: Every day | ORAL | Status: DC
  Administered 2023-07-28: 40 mg via ORAL
  Filled 2023-07-27: qty 1

## 2023-07-27 MED ORDER — TRAMADOL HCL 50 MG PO TABS
50.0000 mg | ORAL_TABLET | Freq: Once | ORAL | Status: AC
  Administered 2023-07-27: 50 mg via ORAL
  Filled 2023-07-27 (×2): qty 1

## 2023-07-27 MED ORDER — CARVEDILOL 12.5 MG PO TABS
6.2500 mg | ORAL_TABLET | Freq: Two times a day (BID) | ORAL | Status: DC
  Administered 2023-07-28: 6.25 mg via ORAL
  Filled 2023-07-27: qty 2

## 2023-07-27 MED ORDER — ACETAMINOPHEN 325 MG PO TABS: 650.0000 mg | ORAL_TABLET | ORAL | Status: DC | PRN

## 2023-07-27 MED ORDER — ACETAMINOPHEN 160 MG/5ML PO SOLN: 650.0000 mg | ORAL | Status: DC | PRN

## 2023-07-27 NOTE — ED Notes (Signed)
 Patient ambulated from restroom with no complaints and no noted deficits.

## 2023-07-27 NOTE — ED Notes (Signed)
 Dr. Amada Jupiter reports that if pt's symptoms worsen before 1645 today then we need to reactivate CODE STROKE due to being within 4.5 hours of LKW.

## 2023-07-27 NOTE — Assessment & Plan Note (Addendum)
 Stable. -Hold losartan and Lasix for contrast exposure -Resume carvedilol

## 2023-07-27 NOTE — Care Management Obs Status (Signed)
 MEDICARE OBSERVATION STATUS NOTIFICATION   Patient Details  Name: Karen Potter MRN: [id number] Date of Birth: 1941/01/05   Medicare Observation Status Notification Given:  Yes    Barron Alvine, RN 07/27/2023, 5:59 PM

## 2023-07-27 NOTE — Consult Note (Signed)
 [facility] Telemedicine Consult   Requesting Provider: Para Skeans Consult Participants: Bedside nurse, telestroke nurse Location of the provider: [facility] Location of the patient: [facility] ED  This consult was provided via telemedicine with 2-way video and audio communication. The patient/family was informed that care would be provided in this way and agreed to receive care in this manner.    Chief Complaint: slurred speech.   HPI: 83 yo F with a history of previous TIA, diabetes, hyperlipidemia who presents with a transient episode of aphasia.  She had lunch and was speaking with her husband normally during that time, however following this she went to tell her husband something and found that she could not get the words out.  She did not have any episode of true unresponsiveness, loss of consciousness, seizure-like activity.  She feels that she does not have any blank areas in her memory, just that she was having trouble "remembering words."  She even could not remember a family member's name.  Her speech has improved in the intervening period, though she does not feel like she is completely back to normal at this point.  No visual change, weakness or numbness.    LKW: 12:15 tnk given?: No, mild symptoms IR Thrombectomy? No, mild symptoms Modified Rankin Scale: 0-Completely asymptomatic and back to baseline post- stroke Time of teleneurologist evaluation: 1348  Exam: Vitals:   07/27/23 1339  BP: (!) 149/76  Pulse: 74  Resp: 16  Temp: 98.7 F (37.1 C)  SpO2: 97%    General: In bed, NAD  1A: Level of Consciousness - 0 1B: Ask Month and Age - 1 1C: 'Blink Eyes' & 'Squeeze Hands' - 0 2: Test Horizontal Extraocular Movements - 0 3: Test Visual Fields - 0 4: Test Facial Palsy - 0 5A: Test Left Arm Motor Drift - 0 5B: Test Right Arm Motor Drift - 0 6A: Test Left Leg Motor Drift - 0 6B: Test Right Leg Motor Drift - 0 7: Test Limb Ataxia - 0 8: Test Sensation - 0 9:  Test Language/Aphasia- 1 10: Test Dysarthria - 0 11: Test Extinction/Inattention - 0 NIHSS score: Two  Her aphasia is very very mild, she is able to name objects and able to describe the NIH stroke scale picture with relative fluency.  She does have occasional hesitancy with word finding.  Imaging Reviewed: CT/CTA-negative  Labs reviewed in epic and pertinent values follow: CBG 153   Assessment: 83 year old female with transient episode of aphasia.  She had a similar episode in 2013, and think she has had a total of three episodes of very similar.  She does have multiple stroke risk factors and therefore I would favor evaluating this as transient ischemic attack.  That being said, with a history of recurrent stereotyped spells, I would also favor evaluating for seizure with EEG.  If EEG is negative, I would favor treating this as TIA.  Recommendations:  - HgbA1c, fasting lipid panel - MRI of the brain without contrast - Frequent neuro checks - Echocardiogram - Prophylactic therapy-Antiplatelet med: Aspirin - dose 81mg  and plavix 75mg  daily  - Risk factor modification - Telemetry monitoring - EEG    Ritta Slot, MD [facility] [phone]  If 7pm- 7am, please page neurology on call as listed in AMION.

## 2023-07-27 NOTE — Assessment & Plan Note (Signed)
-   CPAP at bedtime

## 2023-07-27 NOTE — ED Triage Notes (Signed)
 Pt arrived via POV c/o new onset of slurred speech. Pt reports LKW was apprx 1hr PTA today. Pt denies numbness, tingling, denies visual changes. EDP present in Triage. Per Pts husband, Pt started having memory problems and difficulty forming sentences.

## 2023-07-27 NOTE — TOC Initial Note (Signed)
 Transition of Care [facility]) - Initial/Assessment Note    Patient Details  Name: Karen Potter MRN: [id number] Date of Birth: July 25, 1940  Transition of Care [facility]) CM/SW Contact:    Barron Alvine, RN Phone Number: 07/27/2023, 7:23 PM  Clinical Narrative:                 Transition of Care Department [facility]) has reviewed patient and no other TOC needs have been identified at this time. We will continue to monitor patient advancement through interdisciplinary progression rounds. If new patient transition needs arise, please place a TOC consult.        Patient Goals and CMS Choice            Expected Discharge Plan and Services    Pt plans to return home.                                           Prior Living Arrangements/Services                       Activities of Daily Living   ADL Screening (condition at time of admission) Independently performs ADLs?: Yes (appropriate for developmental age) Is the patient deaf or have difficulty hearing?: No Does the patient have difficulty seeing, even when wearing glasses/contacts?: No Does the patient have difficulty concentrating, remembering, or making decisions?: No  Permission Sought/Granted                  Emotional Assessment              Admission diagnosis:  TIA (transient ischemic attack) [G45.9] Slurred speech [R47.81] Patient Active Problem List   Diagnosis Date Noted   Slurred speech 07/27/2023   Type 2 diabetes mellitus with diabetic neuropathy, without long-term current use of insulin (HCC) 06/23/2022   Sebaceous cyst 02/18/2022   Chronic peptic ulcer of stomach 12/04/2021   Family history of malignant neoplasm of digestive organs 12/04/2021   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   History of colonic polyps 12/04/2021   Renal insufficiency 12/04/2021   History of TIAs 04/26/2018   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   Mixed conductive and sensorineural hearing loss of both  ears 01/27/2018   Statin-induced myositis 08/09/2017   Obesity, Class III, BMI 40-49.9 (morbid obesity) (HCC) 10/28/2016   Primary hyperparathyroidism (HCC) 05/06/2015   Type 2 diabetes mellitus with other specified complication (HCC)    GERD (gastroesophageal reflux disease) 11/05/2013   Fatty liver 06/01/2013   Hypercalcemia 05/14/2013   Hypertension associated with diabetes (HCC)    Hypothyroid    Migraine    Polio    Seasonal allergies    Vitamin D deficiency    Aphasia 04/11/2012   OBSTRUCTIVE SLEEP APNEA 05/31/2008   Hyperlipidemia associated with type 2 diabetes mellitus (HCC) 05/31/2008   OVARIAN CYST 05/31/2008   PCP:  Dettinger, Elige Radon, MD Pharmacy:   [facility] [place] [address] [place] - [address] [place] [address] Phone: [phone] Fax: [phone]  [facility]) - [place], [place] - [address] [facility] [address] [place] [address] [place] [address] Phone: [phone] Fax: [phone]  [facility] - [place], [place] - [address] [place]  [address] Phone: [phone] Fax: [phone]  [facility] [phone] - [place],  [place] - [address] [place] [address] Phone: [phone] Fax: [phone]  [facility] - [place], [place] - [address] [place] [address] Phone: [phone] Fax: [phone]     Social Drivers of Health (SDOH) Social History: SDOH Screenings   Food Insecurity: No Food Insecurity (07/27/2023)  Housing: Low Risk  (07/27/2023)  Transportation Needs: No Transportation Needs (07/27/2023)  Utilities: Not At Risk (07/27/2023)  Alcohol Screen: Low Risk  (12/08/2022)  Depression (PHQ2-9): Low Risk  (04/18/2023)  Financial Resource Strain: Low Risk  (12/08/2022)  Physical Activity: Inactive (12/08/2022)  Social Connections: Socially Integrated (07/27/2023)  Stress: No  Stress Concern Present (12/08/2022)  Tobacco Use: Low Risk  (07/20/2023)  Health Literacy: Adequate Health Literacy (12/08/2022)   SDOH Interventions:     Readmission Risk Interventions     No data to display

## 2023-07-27 NOTE — ED Notes (Signed)
 1345 Code stroke cart activated, LKW 1220, slurred speech and trouble forming sentences- expressive aphasia.  1346 pt to CT 1347 Dr Amada Jupiter paged 1348 Dr Amada Jupiter on camera- examined pt in CT 1403 pt back from CT 1410 sx resolving, no TNK per Dr Amada Jupiter

## 2023-07-27 NOTE — Assessment & Plan Note (Addendum)
 Resolved in the ED.  Back to baseline.  No other focal neurologic deficits.  Head CT, head and neck negative for acute abnormality.  Prior similar episodes-last episode 06/2021. -MRI brain- Suspected 3 mm subacute hemorrhage in the posterior right temporal lobe. Minimal edema without mass effect. No acute infarct. -Evaluated by teleneurologist Dr. Amada Jupiter, recommended stroke workup and seizure workup with EEG due to history of recurrent stereotyped spells.  With MRI findings, I reached back to neurology, recommended repeating CT head, we would have expected hemorrhage to show up on the CT. Besides it's subacute. - If repeat CT negative, continue antiplatelet with just Plavix and okay to admit here. - HgbA1c, fasting lipid panel -Frequent neurochecks -Echocardiogram - EEG -Aspirin 81 mg given in ED, discontinue -Resume Crestor -Considering MRI findings, as needed hydralazine for systolic greater than 160.

## 2023-07-27 NOTE — H&P (Addendum)
 History and Physical    Karen Potter:[id number] DOB: 1941/02/28 DOA: 07/27/2023  PCP: Dettinger, Elige Radon, MD   Patient coming from: Home  I have personally briefly reviewed patient's old medical records in [facility]  Chief Complaint: Slurred speech  HPI: Karen Potter is a 83 y.o. female with medical history significant for diabetes mellitus, hypertension, OSA on CPAP, TIA. Patient presented to the ED reports of difficulty getting out her words, and slurred speech.  She has a history of TIA and has had similar speech difficulty with prior TIA episodes. Patient reports she was trying to communicate with her husband when suddenly she could not get her words out, and the words she was able to get out were gabbled.  Husband is at bedside and confirms this history.  No facial asymmetry, no weakness of extremities, no change in vision, no normal sensation.  She is on Plavix and compliant.  ED Course: On arrival to the ED, patient was still having aphasia, EDP witnessed this, but by the time teleneurology evaluated patient, patient speech was back to baseline.   Stable vitals. CT and CTA head and neck negative for acute abnormality. EKG shows sinus rhythm, ventricular bigeminy,, LAFB. Evaluated by teleneurologist Dr. Amada Jupiter, recommended admission for TIA/stroke workup, and seizure with EEG.  Review of Systems: As per HPI all other systems reviewed and negative.  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   age 20--  pt states was quarantined   no treatment ;  per pt no  symptoms or issues since   History of kidney stones    History of TIA (transient ischemic attack)    03-23-2014  w/ episode temporay amnesia   Hyperlipidemia    Hypertension    Hypothyroidism    Iron deficiency anemia    LAFB (left anterior fascicular block)    OA (osteoarthritis)    OSA on CPAP    severe per study 2003   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   Post-polio limb muscle weakness    left leg  w/ 3 repair surgery's   RBBB (right bundle branch block)    Seasonal allergies    TIA (transient ischemic attack)    2023   Type 2 diabetes mellitus (HCC)    Vitamin D deficiency     Past Surgical History:  Procedure Laterality Date   BACK SURGERY     CARPAL TUNNEL RELEASE Right 05/10/2007   CHOLECYSTECTOMY OPEN  05/11/1983   and Appendectomy   CYSTO/  RIGHT URETEROSCOPIC STONE EXTRACTON/  STENT PLACEMENT  06/ 2016   in [place]   CYSTOSCOPY/RETROGRADE/URETEROSCOPY/STONE EXTRACTION WITH BASKET Right 11/17/2015   Procedure: CYSTOSCOPY/RETROGRADE PYELOGRAM RIGHT/DIAGNOSTIC RIGHT URETEROSCOPY/LASER RENAL CALCIFICATION/RIGHT STENT PLACEMENT;  Surgeon: Malen Gauze, MD;  Location[facility];  Service: Urology;  Laterality: Right;   ESOPHAGOGASTRODUODENOSCOPY N/A 08/29/2015   Procedure: ESOPHAGOGASTRODUODENOSCOPY (EGD);  Surgeon: West Bali, MD;  Location: [facility];  Service: Endoscopy;  Laterality: N/A;   HOLMIUM LASER APPLICATION Right 11/17/2015   Procedure: HOLMIUM LASER APPLICATION;  Surgeon: Malen Gauze, MD;  Location: [facility];  Service: Urology;  Laterality: Right;   KNEE ARTHROSCOPY Right 05/10/1997   LEFT HEEL REPAIR SURGERY  1954;  1985;  1995   polio   LUMBAR SPINE SURGERY  09/01/2010   L4 -L5 fusion,  L3 laminectomy,  L5 - S1 foraminotomy   OVARIAN CYST SURGERY Right 05/10/1964   PARATHYROIDECTOMY N/A 05/06/2015   Procedure: PARATHYROIDECTOMY;  Surgeon: Darnell Level, MD;  Location: [facility];   Service: General;  Laterality: N/A;   left superior   TONSILLECTOMY  as child   TOTAL ABDOMINAL HYSTERECTOMY W/ BILATERAL SALPINGOOPHORECTOMY  05/10/1982   TOTAL KNEE ARTHROPLASTY Right 04/08/2008   TRANSTHORACIC ECHOCARDIOGRAM  04/07/2012   grade 1 diastolic function,  ef 60-65%/  mild AV calcification without stenosis/  trivial MR     reports that she has never smoked. She has never used smokeless tobacco. She reports that she does not drink alcohol and does not use drugs.  Allergies  Allergen Reactions   Codeine Itching   Dilaudid [Hydromorphone] Other (See Comments)    Mouth blisters   Other Itching    Most pain meds cause itching.  When has to take pain medication, she has been instructed to take Benadryl   Hydrocodone-Acetaminophen Other (See Comments)   Doxycycline Rash and Other (See Comments)    Family History  Problem Relation Age of Onset   Heart failure Mother        No details.  No MI   Cancer Father        Lung   Cancer Brother        Lung   Stroke Maternal Grandfather    Cancer Paternal Grandmother        rectal   Cancer Paternal Grandfather        gastric   Sleep apnea Neg Hx     Prior to Admission medications   Medication Sig Start Date End Date Taking? Authorizing Provider  acetaminophen (TYLENOL) 650 MG CR tablet Take 650 mg by mouth in the morning.    [provider]  albuterol (VENTOLIN HFA) 108 (90 Base) MCG/ACT inhaler Inhale 2 puffs into the lungs every 6 (six) hours as needed for wheezing or shortness of breath. 09/07/21   Dettinger, Elige Radon, MD  allopurinol (ZYLOPRIM) 100 MG tablet Take 1 tablet (100 mg total) by mouth daily. 10/06/22   Dettinger, Elige Radon, MD  Blood Glucose Monitoring Suppl (BLOOD GLUCOSE MONITOR SYSTEM) w/Device KIT Use glucose meter to check blood sugar up to 3 times daily as instructed. One Touch Verio preferred by insurance. Dx: EE11.69 05/12/18   Dettinger, Elige Radon, MD  carvedilol (COREG) 6.25 MG tablet Take 1 tablet  (6.25 mg total) by mouth 2 (two) times daily with a meal. 01/06/23   Dettinger, Elige Radon, MD  Cholecalciferol (VITAMIN D3) 2000 UNITS capsule Take 2,000 Units by mouth 2 (two) times daily.     [provider]  clobetasol ointment (TEMOVATE) 0.05 % Apply 1 Application topically 2 (two) times daily. 12/06/22   Gabriel Earing, FNP  clopidogrel (PLAVIX) 75 MG tablet Take 1 tablet (75 mg total) by mouth every evening. 10/06/22   Dettinger, Elige Radon, MD  clotrimazole-betamethasone (LOTRISONE) cream Apply 1 Application topically daily. 12/30/21   Dettinger, Elige Radon, MD  dapagliflozin propanediol (FARXIGA) 10 MG TABS tablet Take 1 tablet (10  mg total) by mouth daily before breakfast. 02/23/23   Dettinger, Elige Radon, MD  Elderberry 500 MG CAPS Take 500 mg by mouth daily.    [provider]  fluticasone (FLONASE) 50 MCG/ACT nasal spray Place 1 spray into both nostrils 2 (two) times daily as needed for allergies or rhinitis. 01/06/23   Dettinger, Elige Radon, MD  furosemide (LASIX) 40 MG tablet Take 1 tablet (40 mg total) by mouth daily. 06/23/22   Dettinger, Elige Radon, MD  glucose blood (ONETOUCH VERIO) test strip USE 1 STRIP TO CHECK GLUCOSE UP TO THREE TIMES DAILY AS  INSTRUCTED 06/23/22   Dettinger, Elige Radon, MD  L-FORMULA LYSINE HCL PO Take 1 Dose by mouth daily.    [provider]  Lancets Misc. KIT Use patient & insurance preferred lancet device to test blood glucose up to 3 times daily for Dx: E11.69 05/12/18   Dettinger, Elige Radon, MD  levothyroxine (SYNTHROID) 112 MCG tablet Take 1 tablet (112 mcg total) by mouth daily. 04/22/23   Dettinger, Elige Radon, MD  losartan (COZAAR) 25 MG tablet Take 1 tablet (25 mg total) by mouth daily. 10/06/22   Dettinger, Elige Radon, MD  nystatin (MYCOSTATIN/NYSTOP) powder APPLY  POWDER TOPICALLY TO AFFECTED AREA THREE TIMES DAILY 06/15/23   Dettinger, Elige Radon, MD  pantoprazole (PROTONIX) 40 MG tablet Take 1 tablet (40 mg total) by mouth daily. 01/06/23    Dettinger, Elige Radon, MD  rosuvastatin (CRESTOR) 5 MG tablet Take 1 tablet by mouth once daily 06/20/23   Dettinger, Elige Radon, MD  sitaGLIPtin (JANUVIA) 100 MG tablet Take 1 tablet (100 mg total) by mouth daily. 10/06/22   Dettinger, Elige Radon, MD    Physical Exam: Vitals:   07/27/23 1340 07/27/23 1415 07/27/23 1430 07/27/23 1445  BP:  (!) 143/76 136/78 (!) 118/51  Pulse:   83 (!) 50  Resp:  14 (!) 21 20  Temp:      TempSrc:      SpO2:   97% 93%  Weight: 103.4 kg     Height: 5\' 4"  (1.626 m)       Constitutional: NAD, calm, comfortable Vitals:   07/27/23 1340 07/27/23 1415 07/27/23 1430 07/27/23 1445  BP:  (!) 143/76 136/78 (!) 118/51  Pulse:   83 (!) 50  Resp:  14 (!) 21 20  Temp:      TempSrc:      SpO2:   97% 93%  Weight: 103.4 kg     Height: 5\' 4"  (1.626 m)      Eyes: PERRL, lids and conjunctivae normal ENMT: Mucous membranes are moist.  Neck: normal, supple, no masses, no thyromegaly Respiratory: clear to auscultation bilaterally, no wheezing, no crackles. Normal respiratory effort. No accessory muscle use.  Cardiovascular: Regular rate and rhythm, no murmurs / rubs / gallops. No extremity edema. 2+ pedal pulses. No carotid bruits.  Abdomen: no tenderness, no masses palpated. No hepatosplenomegaly. Bowel sounds positive.  Musculoskeletal: no clubbing / cyanosis. No joint deformity upper and lower extremities.  Skin: no rashes, lesions, ulcers. No induration Neurologic: Facial asymmetry, EOMI, 5 of 5 strength in bilateral upper and lower extremities, speech fluent sensation intact globally. Psychiatric: Normal judgment and insight. Alert and oriented x 3. Normal mood.   Labs on Admission: I have personally reviewed following labs and imaging studies  CBC: Recent Labs  Lab 07/27/23 1413 07/27/23 1415  WBC 7.1  --   NEUTROABS 4.0  --   HGB 14.0 14.6  HCT 42.0 43.0  MCV 91.9  --   PLT 251  --    Basic Metabolic Panel: Recent Labs  Lab 07/27/23 1413 07/27/23 1415   NA 138 142  K 3.4* 3.4*  CL 101 102  CO2 27  --   GLUCOSE 165* 163*  BUN 12 11  CREATININE 0.97 1.00  CALCIUM 8.9  --    GFR: Estimated Creatinine Clearance: 51.7 mL/min (by C-G formula based on SCr of 1 mg/dL). Liver Function Tests: Recent Labs  Lab 07/27/23 1413  AST 19  ALT 17  ALKPHOS 75  BILITOT 0.8  PROT 6.9  ALBUMIN 3.4*   Coagulation Profile: Recent Labs  Lab 07/27/23 1413  INR 1.1   CBG: Recent Labs  Lab 07/27/23 1345  GLUCAP 153*   Urine analysis:    Component Value Date/Time   COLORURINE YELLOW 06/14/2021 1358   APPEARANCEUR Clear 09/15/2021 1118   LABSPEC <1.005 (L) 06/14/2021 1358   PHURINE 6.0 06/14/2021 1358   GLUCOSEU Negative 09/15/2021 1118   HGBUR TRACE (A) 06/14/2021 1358   BILIRUBINUR Negative 09/15/2021 1118   KETONESUR NEGATIVE 06/14/2021 1358   PROTEINUR Trace (A) 09/15/2021 1118   PROTEINUR NEGATIVE 06/14/2021 1358   UROBILINOGEN negative 06/01/2013 1142   UROBILINOGEN 0.2 04/08/2012 0045   NITRITE Negative 09/15/2021 1118   NITRITE NEGATIVE 06/14/2021 1358   LEUKOCYTESUR 1+ (A) 09/15/2021 1118   LEUKOCYTESUR SMALL (A) 06/14/2021 1358    Radiological Exams on Admission: CT ANGIO HEAD NECK W WO CM (CODE STROKE) Result Date: 07/27/2023 CLINICAL DATA:  Provided history: Neuro deficit, acute, stroke suspected. Additional history provided: New onset of slurred speech. EXAM: CT ANGIOGRAPHY HEAD AND NECK WITH AND WITHOUT CONTRAST TECHNIQUE: Multidetector CT imaging of the head and neck was performed using the standard protocol during bolus administration of intravenous contrast. Multiplanar CT image reconstructions and MIPs were obtained to evaluate the vascular anatomy. Carotid stenosis measurements (when applicable) are obtained utilizing NASCET criteria, using the distal internal carotid diameter as the denominator. RADIATION DOSE REDUCTION: This exam was performed according to the departmental dose-optimization program which includes  automated exposure control, adjustment of the mA and/or kV according to patient size and/or use of iterative reconstruction technique. CONTRAST:  75mL OMNIPAQUE IOHEXOL 350 MG/ML SOLN COMPARISON:  MRI brain and MRA head 06/15/2021. FINDINGS: CTA NECK FINDINGS Aortic arch: Standard aortic branching. Mild sclerotic plaque within the visualized aortic arch. Streak/beam hardening artifact arising from a dense contrast bolus partially obscures the left subclavian artery. Within this limitation, there is no appreciable hemodynamically significant innominate or proximal subclavian artery stenosis. Right carotid system: CCA and ICA patent within the neck without stenosis. Mild atherosclerotic plaque about the carotid bifurcation. Left carotid system: CCA and ICA patent within the neck without stenosis. Minimal atherosclerotic plaque about the carotid bifurcation. Vertebral arteries: Codominant and patent within the neck without stenosis. Skeleton: Spondylosis of the cervical and visualized upper thoracic levels. C4-C5 vertebral ankylosis. Other neck: No neck mass or cervical lymphadenopathy. Prior thyroidectomy. Upper chest: No consolidation within the imaged lung apices. Review of the MIP images confirms the above findings CTA HEAD FINDINGS Anterior circulation: The intracranial internal carotid arteries are patent. Nonstenotic atherosclerotic plaque within both vessels. The M1 middle cerebral arteries are patent. No M2 proximal branch occlusion or high-grade proximal stenosis. The anterior cerebral arteries are patent. Mildly hypoplastic right A1 segment. No intracranial aneurysm is identified. Posterior circulation: The intracranial vertebral arteries are patent. Nonstenotic atherosclerotic plaque within the intracranial right vertebral artery. The basilar artery is patent.  The posterior cerebral arteries are patent. Posterior communicating arteries are diminutive or absent, bilaterally. Venous sinuses: Within the  limitations of contrast timing, no convincing thrombus. Anatomic variants: As described. Review of the MIP images confirms the above findings No emergent large vessel occlusion identified. These results were communicated to Dr. Amada Jupiter at 2:24 pmon 3/19/2025by text page via the [facility] messaging system. IMPRESSION: CTA neck: 1. The common carotid, internal carotid and vertebral arteries are patent within the neck without stenosis. Mild atherosclerotic plaque about the carotid bifurcations, bilaterally. 2. Aortic Atherosclerosis (ICD10-I70.0). CTA head: No proximal intracranial large vessel occlusion or high-grade proximal arterial stenosis identified. Electronically Signed   By: Jackey Loge D.O.   On: 07/27/2023 14:25   CT HEAD CODE STROKE WO CONTRAST Result Date: 07/27/2023 CLINICAL DATA:  Code stroke. Provided history: Neuro deficit, acute, stroke suspected. Additional history provided: Altered mental status. EXAM: CT HEAD WITHOUT CONTRAST TECHNIQUE: Contiguous axial images were obtained from the base of the skull through the vertex without intravenous contrast. RADIATION DOSE REDUCTION: This exam was performed according to the departmental dose-optimization program which includes automated exposure control, adjustment of the mA and/or kV according to patient size and/or use of iterative reconstruction technique. COMPARISON:  MRI brain and MRA head 06/15/2021. Head CT 06/14/2021. FINDINGS: Brain: Generalized cerebral atrophy. There is no acute intracranial hemorrhage. No demarcated cortical infarct. No extra-axial fluid collection. No intracranial mass effect or midline shift. Vascular: No hyperdense vessel.  Atherosclerotic calcifications. Skull: No calvarial fracture or aggressive osseous lesion. Sinuses/Orbits: No mass or acute finding within the imaged orbits. Severe mucosal thickening within the left maxillary sinus. ASPECTS (Alberta Stroke Program Early CT Score) - Ganglionic level infarction (caudate,  lentiform nuclei, internal capsule, insula, M1-M3 cortex): 7 - Supraganglionic infarction (M4-M6 cortex): 3 Total score (0-10 with 10 being normal): 10 These results were communicated to Dr. Amada Jupiter at 2:07 pmon 3/19/2025by text page via the [facility] messaging system. IMPRESSION: 1. No evidence of an acute intracranial abnormality. 2. Cerebral atrophy. 3. Severe left maxillary sinusitis. Electronically Signed   By: Jackey Loge D.O.   On: 07/27/2023 14:07    EKG: Independently reviewed.  Sinus tachycardia, rate 104, QTc 431.  Ventricular bigeminy  Assessment/Plan Principal Problem:   Aphasia Active Problems:   OBSTRUCTIVE SLEEP APNEA   Hypertension associated with diabetes (HCC)   Hypothyroid   Type 2 diabetes mellitus with other specified complication (HCC)  Assessment and Plan: * Aphasia Resolved in the ED.  Back to baseline.  No other focal neurologic deficits.  Head CT, head and neck negative for acute abnormality.  Prior similar episodes-last episode 06/2021. -MRI brain- Suspected 3 mm subacute hemorrhage in the posterior right temporal lobe. Minimal edema without mass effect. No acute infarct. -Evaluated by teleneurologist Dr. Amada Jupiter, recommended stroke workup and seizure workup with EEG due to history of recurrent stereotyped spells.  With MRI findings, I reached back to neurology, recommended repeating CT head, we would have expected hemorrhage to show up on the CT.   - If repeat CT negative, continue antiplatelet with just Plavix and okay to admit here. - HgbA1c, fasting lipid panel -Frequent neurochecks -Echocardiogram - EEG -Aspirin 81 mg given in ED, discontinue -Resume Crestor -Considering MRI findings, as needed hydralazine for systolic greater than 160.   Type 2 diabetes mellitus with other specified complication (HCC) - HgbA1c - SSi- S -Hold Januvia Farxiga  Hypothyroid Recent TSH is quite suppressed at 0.38.  - On Synthroid 112 mg  ??Follow up as  outpatient  Hypertension associated with diabetes (HCC) Stable. -Hold losartan and Lasix for contrast exposure -Resume carvedilol  OBSTRUCTIVE SLEEP APNEA CPAP at bedtime   DVT prophylaxis: SCDs-considering MRI findings Code Status: Full code, confirmed with patient and spouse at bedside. Family Communication: Spouse at bedside.  Daughter Erskine Squibb who works in our lab here at [employer] later present at bedside, all questions answered at bedside. Disposition Plan: ~ 2 days Consults called: Neurology Admission status: Inpatient, telemetry I certify that at the point of admission it is my clinical judgment that the patient will require inpatient hospital care spanning beyond 2 midnights from the point of admission due to high intensity of service, high risk for further deterioration and high frequency of surveillance required.   Author: Onnie Boer, MD 07/27/2023 6:20 PM  For on call review [email or web address].

## 2023-07-27 NOTE — Assessment & Plan Note (Signed)
-   HgbA1c - SSi- S -Hold Januvia Farxiga

## 2023-07-27 NOTE — Procedures (Signed)
 Patient Name: Karen Potter  MRN: [id number]  Epilepsy Attending: Charlsie Quest  Referring Physician/Provider: Jacalyn Lefevre, MD  Date: 07/27/2023  Duration: 21.38 mins  Patient history: 83yo F with episode of difficulty getting out her words, and slurred speech. EEG to evaluate for seizure.  Level of alertness: Awake  AEDs during EEG study: None  Technical aspects: This EEG study was done with scalp electrodes positioned according to the 10-20 International system of electrode placement. Electrical activity was reviewed with band pass filter of 1-70Hz , sensitivity of 7 uV/mm, display speed of 43mm/sec with a 60Hz  notched filter applied as appropriate. EEG data were recorded continuously and digitally stored.  Video monitoring was available and reviewed as appropriate.  Description: The posterior dominant rhythm consists of 8 Hz activity of moderate voltage (25-35 uV) seen predominantly in posterior head regions, symmetric and reactive to eye opening and eye closing. Hyperventilation and photic stimulation were not performed.     IMPRESSION: This study is within normal limits. No seizures or epileptiform discharges were seen throughout the recording.  A normal interictal EEG does not exclude the diagnosis of epilepsy.  Joycelynn Fritsche Annabelle Harman

## 2023-07-27 NOTE — ED Notes (Signed)
 Pt's daughter at bedside.  Brought patient's CPAP from home.  Pt and daughter unclear on how to apply CPAP.  RT called to assist

## 2023-07-27 NOTE — Assessment & Plan Note (Addendum)
 Recent TSH is quite suppressed at 0.38.  - On Synthroid 112 mg  ??Follow up as outpatient

## 2023-07-27 NOTE — ED Provider Notes (Signed)
 [place] EMERGENCY DEPARTMENT AT [facility] Provider Note   CSN: [id number] Arrival date & time: 07/27/23  1326  An emergency department physician performed an initial assessment on this suspected stroke patient at 1346.  History  Chief Complaint  Patient presents with   Altered Mental Status    Karen Potter is a 83 y.o. female.  Pt is an 83 yo female with pmhx significant for TIA, polio (left leg weakness), hld, htn, DM2, duodenal ulcers, kidney stones, OA gastritis, and hx hep b.  Pt said she was fine this am.  She developed difficulty finding the right words about 1 hr pta.  (LKW at 1220) Pt denies any other neuro sx.  Pt said this has happened in the past with previous TIAs.       Home Medications Prior to Admission medications   Medication Sig Start Date End Date Taking? Authorizing Provider  acetaminophen (TYLENOL) 650 MG CR tablet Take 650 mg by mouth in the morning.    [provider]  albuterol (VENTOLIN HFA) 108 (90 Base) MCG/ACT inhaler Inhale 2 puffs into the lungs every 6 (six) hours as needed for wheezing or shortness of breath. 09/07/21   Dettinger, Elige Radon, MD  allopurinol (ZYLOPRIM) 100 MG tablet Take 1 tablet (100 mg total) by mouth daily. 10/06/22   Dettinger, Elige Radon, MD  Blood Glucose Monitoring Suppl (BLOOD GLUCOSE MONITOR SYSTEM) w/Device KIT Use glucose meter to check blood sugar up to 3 times daily as instructed. One Touch Verio preferred by insurance. Dx: EE11.69 05/12/18   Dettinger, Elige Radon, MD  carvedilol (COREG) 6.25 MG tablet Take 1 tablet (6.25 mg total) by mouth 2 (two) times daily with a meal. 01/06/23   Dettinger, Elige Radon, MD  Cholecalciferol (VITAMIN D3) 2000 UNITS capsule Take 2,000 Units by mouth 2 (two) times daily.     [provider]  clobetasol ointment (TEMOVATE) 0.05 % Apply 1 Application topically 2 (two) times daily. 12/06/22   Gabriel Earing, FNP  clopidogrel (PLAVIX) 75 MG tablet Take 1 tablet (75 mg  total) by mouth every evening. 10/06/22   Dettinger, Elige Radon, MD  clotrimazole-betamethasone (LOTRISONE) cream Apply 1 Application topically daily. 12/30/21   Dettinger, Elige Radon, MD  dapagliflozin propanediol (FARXIGA) 10 MG TABS tablet Take 1 tablet (10 mg total) by mouth daily before breakfast. 02/23/23   Dettinger, Elige Radon, MD  Elderberry 500 MG CAPS Take 500 mg by mouth daily.    [provider]  fluticasone (FLONASE) 50 MCG/ACT nasal spray Place 1 spray into both nostrils 2 (two) times daily as needed for allergies or rhinitis. 01/06/23   Dettinger, Elige Radon, MD  furosemide (LASIX) 40 MG tablet Take 1 tablet (40 mg total) by mouth daily. 06/23/22   Dettinger, Elige Radon, MD  glucose blood (ONETOUCH VERIO) test strip USE 1 STRIP TO CHECK GLUCOSE UP TO THREE TIMES DAILY AS  INSTRUCTED 06/23/22   Dettinger, Elige Radon, MD  L-FORMULA LYSINE HCL PO Take 1 Dose by mouth daily.    [provider]  Lancets Misc. KIT Use patient & insurance preferred lancet device to test blood glucose up to 3 times daily for Dx: E11.69 05/12/18   Dettinger, Elige Radon, MD  levothyroxine (SYNTHROID) 112 MCG tablet Take 1 tablet (112 mcg total) by mouth daily. 04/22/23   Dettinger, Elige Radon, MD  losartan (COZAAR) 25 MG tablet Take 1 tablet (25 mg total) by mouth daily. 10/06/22   Dettinger, Elige Radon, MD  nystatin (MYCOSTATIN/NYSTOP) powder APPLY  POWDER TOPICALLY TO AFFECTED AREA THREE TIMES DAILY 06/15/23   Dettinger, Elige Radon, MD  pantoprazole (PROTONIX) 40 MG tablet Take 1 tablet (40 mg total) by mouth daily. 01/06/23   Dettinger, Elige Radon, MD  rosuvastatin (CRESTOR) 5 MG tablet Take 1 tablet by mouth once daily 06/20/23   Dettinger, Elige Radon, MD  sitaGLIPtin (JANUVIA) 100 MG tablet Take 1 tablet (100 mg total) by mouth daily. 10/06/22   Dettinger, Elige Radon, MD      Allergies    Codeine, Dilaudid [hydromorphone], Other, Hydrocodone-acetaminophen, and Doxycycline    Review of Systems   Review of Systems   Neurological:  Positive for speech difficulty.  All other systems reviewed and are negative.   Physical Exam Updated Vital Signs BP (!) 118/51   Pulse (!) 50   Temp 98.7 F (37.1 C) (Oral)   Resp 20   Ht 5\' 4"  (1.626 m)   Wt 103.4 kg   SpO2 93%   BMI 39.13 kg/m  Physical Exam Vitals and nursing note reviewed.  Constitutional:      Appearance: Normal appearance.  HENT:     Head: Normocephalic and atraumatic.     Right Ear: External ear normal.     Left Ear: External ear normal.     Nose: Nose normal.     Mouth/Throat:     Mouth: Mucous membranes are moist.     Pharynx: Oropharynx is clear.  Eyes:     Extraocular Movements: Extraocular movements intact.     Conjunctiva/sclera: Conjunctivae normal.     Pupils: Pupils are equal, round, and reactive to light.  Cardiovascular:     Rate and Rhythm: Normal rate and regular rhythm.     Pulses: Normal pulses.     Heart sounds: Normal heart sounds.  Pulmonary:     Effort: Pulmonary effort is normal.     Breath sounds: Normal breath sounds.  Abdominal:     General: Abdomen is flat. Bowel sounds are normal.     Palpations: Abdomen is soft.  Musculoskeletal:        General: Normal range of motion.     Cervical back: Normal range of motion and neck supple.  Skin:    General: Skin is warm.     Capillary Refill: Capillary refill takes less than 2 seconds.  Neurological:     Mental Status: She is alert and oriented to person, place, and time.     Comments: Expressive aphasia  Psychiatric:        Mood and Affect: Mood normal.        Behavior: Behavior normal.     ED Results / Procedures / Treatments   Labs (all labs ordered are listed, but only abnormal results are displayed) Labs Reviewed  COMPREHENSIVE METABOLIC PANEL - Abnormal; Notable for the following components:      Result Value   Potassium 3.4 (*)    Glucose, Bld 165 (*)    Albumin 3.4 (*)    GFR, Estimated 59 (*)    All other components within normal limits   I-STAT CHEM 8, ED - Abnormal; Notable for the following components:   Potassium 3.4 (*)    Glucose, Bld 163 (*)    All other components within normal limits  CBG MONITORING, ED - Abnormal; Notable for the following components:   Glucose-Capillary 153 (*)    All other components within normal limits  ETHANOL  PROTIME-INR  CBC  DIFFERENTIAL  RAPID URINE DRUG  SCREEN, HOSP PERFORMED  URINALYSIS, ROUTINE W REFLEX MICROSCOPIC    EKG EKG Interpretation Date/Time:  Wednesday July 27 2023 14:14:08 EDT Ventricular Rate:  104 PR Interval:  167 QRS Duration:  147 QT Interval:  391 QTC Calculation: 431 R Axis:   -74  Text Interpretation: Sinus tachycardia Ventricular bigeminy Prominent P waves, nondiagnostic RBBB and LAFB Probable left ventricular hypertrophy PVCs are new Confirmed by Jacalyn Lefevre [phone]) on 07/27/2023 3:15:44 PM  Radiology CT ANGIO HEAD NECK W WO CM (CODE STROKE) Result Date: 07/27/2023 CLINICAL DATA:  Provided history: Neuro deficit, acute, stroke suspected. Additional history provided: New onset of slurred speech. EXAM: CT ANGIOGRAPHY HEAD AND NECK WITH AND WITHOUT CONTRAST TECHNIQUE: Multidetector CT imaging of the head and neck was performed using the standard protocol during bolus administration of intravenous contrast. Multiplanar CT image reconstructions and MIPs were obtained to evaluate the vascular anatomy. Carotid stenosis measurements (when applicable) are obtained utilizing NASCET criteria, using the distal internal carotid diameter as the denominator. RADIATION DOSE REDUCTION: This exam was performed according to the departmental dose-optimization program which includes automated exposure control, adjustment of the mA and/or kV according to patient size and/or use of iterative reconstruction technique. CONTRAST:  75mL OMNIPAQUE IOHEXOL 350 MG/ML SOLN COMPARISON:  MRI brain and MRA head 06/15/2021. FINDINGS: CTA NECK FINDINGS Aortic arch: Standard aortic branching.  Mild sclerotic plaque within the visualized aortic arch. Streak/beam hardening artifact arising from a dense contrast bolus partially obscures the left subclavian artery. Within this limitation, there is no appreciable hemodynamically significant innominate or proximal subclavian artery stenosis. Right carotid system: CCA and ICA patent within the neck without stenosis. Mild atherosclerotic plaque about the carotid bifurcation. Left carotid system: CCA and ICA patent within the neck without stenosis. Minimal atherosclerotic plaque about the carotid bifurcation. Vertebral arteries: Codominant and patent within the neck without stenosis. Skeleton: Spondylosis of the cervical and visualized upper thoracic levels. C4-C5 vertebral ankylosis. Other neck: No neck mass or cervical lymphadenopathy. Prior thyroidectomy. Upper chest: No consolidation within the imaged lung apices. Review of the MIP images confirms the above findings CTA HEAD FINDINGS Anterior circulation: The intracranial internal carotid arteries are patent. Nonstenotic atherosclerotic plaque within both vessels. The M1 middle cerebral arteries are patent. No M2 proximal branch occlusion or high-grade proximal stenosis. The anterior cerebral arteries are patent. Mildly hypoplastic right A1 segment. No intracranial aneurysm is identified. Posterior circulation: The intracranial vertebral arteries are patent. Nonstenotic atherosclerotic plaque within the intracranial right vertebral artery. The basilar artery is patent. The posterior cerebral arteries are patent. Posterior communicating arteries are diminutive or absent, bilaterally. Venous sinuses: Within the limitations of contrast timing, no convincing thrombus. Anatomic variants: As described. Review of the MIP images confirms the above findings No emergent large vessel occlusion identified. These results were communicated to Dr. Amada Jupiter at 2:24 pmon 3/19/2025by text page via the [facility] messaging system.  IMPRESSION: CTA neck: 1. The common carotid, internal carotid and vertebral arteries are patent within the neck without stenosis. Mild atherosclerotic plaque about the carotid bifurcations, bilaterally. 2. Aortic Atherosclerosis (ICD10-I70.0). CTA head: No proximal intracranial large vessel occlusion or high-grade proximal arterial stenosis identified. Electronically Signed   By: Jackey Loge D.O.   On: 07/27/2023 14:25   CT HEAD CODE STROKE WO CONTRAST Result Date: 07/27/2023 CLINICAL DATA:  Code stroke. Provided history: Neuro deficit, acute, stroke suspected. Additional history provided: Altered mental status. EXAM: CT HEAD WITHOUT CONTRAST TECHNIQUE: Contiguous axial images were obtained from the base of the skull through  the vertex without intravenous contrast. RADIATION DOSE REDUCTION: This exam was performed according to the departmental dose-optimization program which includes automated exposure control, adjustment of the mA and/or kV according to patient size and/or use of iterative reconstruction technique. COMPARISON:  MRI brain and MRA head 06/15/2021. Head CT 06/14/2021. FINDINGS: Brain: Generalized cerebral atrophy. There is no acute intracranial hemorrhage. No demarcated cortical infarct. No extra-axial fluid collection. No intracranial mass effect or midline shift. Vascular: No hyperdense vessel.  Atherosclerotic calcifications. Skull: No calvarial fracture or aggressive osseous lesion. Sinuses/Orbits: No mass or acute finding within the imaged orbits. Severe mucosal thickening within the left maxillary sinus. ASPECTS (Alberta Stroke Program Early CT Score) - Ganglionic level infarction (caudate, lentiform nuclei, internal capsule, insula, M1-M3 cortex): 7 - Supraganglionic infarction (M4-M6 cortex): 3 Total score (0-10 with 10 being normal): 10 These results were communicated to Dr. Amada Jupiter at 2:07 pmon 3/19/2025by text page via the [facility] messaging system. IMPRESSION: 1. No evidence of an  acute intracranial abnormality. 2. Cerebral atrophy. 3. Severe left maxillary sinusitis. Electronically Signed   By: Jackey Loge D.O.   On: 07/27/2023 14:07    Procedures Procedures    Medications Ordered in ED Medications  aspirin EC tablet 81 mg (81 mg Oral Given 07/27/23 1440)  iohexol (OMNIPAQUE) 350 MG/ML injection 75 mL (75 mLs Intravenous Contrast Given 07/27/23 1359)    ED Course/ Medical Decision Making/ A&P                                 Medical Decision Making Amount and/or Complexity of Data Reviewed Labs: ordered. Radiology: ordered.  Risk Decision regarding hospitalization.   This patient presents to the ED for concern of speech problems, this involves an extensive number of treatment options, and is a complaint that carries with it a high risk of complications and morbidity.  The differential diagnosis includes cva, tia, seizure, electrolyte abn   Co morbidities that complicate the patient evaluation  IA, polio (left leg weakness), hld, htn, DM2, duodenal ulcers, kidney stones, OA gastritis, and hx hep b   Additional history obtained:  Additional history obtained from epic chart review External records from outside source obtained and reviewed including husband   Lab Tests:  I Ordered, and personally interpreted labs.  The pertinent results include:  cbc nl, cmp nl, etoh <10, inr 1.1   Imaging Studies ordered:  I ordered imaging studies including ct head, ct angio, mri brain  I independently visualized and interpreted imaging which showed  CT head: No evidence of an acute intracranial abnormality.  2. Cerebral atrophy.  3. Severe left maxillary sinusitis.  CTA head/neck: CTA neck:    1. The common carotid, internal carotid and vertebral arteries are  patent within the neck without stenosis. Mild atherosclerotic plaque  about the carotid bifurcations, bilaterally.  2. Aortic Atherosclerosis (ICD10-I70.0).    CTA head:    No proximal  intracranial large vessel occlusion or high-grade  proximal arterial stenosis identified.   I agree with the radiologist interpretation   Cardiac Monitoring:  The patient was maintained on a cardiac monitor.  I personally viewed and interpreted the cardiac monitored which showed an underlying rhythm of: nsr   Medicines ordered and prescription drug management:  I ordered medication including asa  for sx  Reevaluation of the patient after these medicines showed that the patient improved I have reviewed the patients home medicines and have made adjustments as  needed   Test Considered:  mri   Critical Interventions:  Code stroke   Consultations Obtained:  I requested consultation with the neurologist (Dr. Amada Jupiter),  and discussed lab and imaging findings as well as pertinent plan - sx have improved, so no TNK.  Pt has no LVO, so no need for IR.  He recommends admission for TIA vs seizure work up. Pt d/w Dr. Mariea Clonts (triad) for admission.   Problem List / ED Course:  TIA:  code stroke called upon arrival.  Sx have improved, so no TNK.  No need for IR as no LVO.  MRI brain and EEG ordered and are pending upon admission.   Reevaluation:  After the interventions noted above, I reevaluated the patient and found that they have :improved   Social Determinants of Health:  Lives at home   Dispostion:  After consideration of the diagnostic results and the patients response to treatment, I feel that the patent would benefit from admission.    CRITICAL CARE Performed by: Jacalyn Lefevre   Total critical care time: 30 minutes  Critical care time was exclusive of separately billable procedures and treating other patients.  Critical care was necessary to treat or prevent imminent or life-threatening deterioration.  Critical care was time spent personally by me on the following activities: development of treatment plan with patient and/or surrogate as well as nursing,  discussions with consultants, evaluation of patient's response to treatment, examination of patient, obtaining history from patient or surrogate, ordering and performing treatments and interventions, ordering and review of laboratory studies, ordering and review of radiographic studies, pulse oximetry and re-evaluation of patient's condition.         Final Clinical Impression(s) / ED Diagnoses Final diagnoses:  TIA (transient ischemic attack)    Rx / DC Orders ED Discharge Orders     None         Jacalyn Lefevre, MD 07/27/23 1544

## 2023-07-27 NOTE — ED Notes (Signed)
 Code stroke paged [phone]

## 2023-07-27 NOTE — Progress Notes (Signed)
STAT EEG complete - results pending. ? ?

## 2023-07-28 ENCOUNTER — Observation Stay (HOSPITAL_COMMUNITY)

## 2023-07-28 ENCOUNTER — Encounter (HOSPITAL_COMMUNITY): Payer: Self-pay | Admitting: Internal Medicine

## 2023-07-28 DIAGNOSIS — I152 Hypertension secondary to endocrine disorders: Secondary | ICD-10-CM

## 2023-07-28 DIAGNOSIS — R4701 Aphasia: Secondary | ICD-10-CM | POA: Diagnosis not present

## 2023-07-28 DIAGNOSIS — I639 Cerebral infarction, unspecified: Secondary | ICD-10-CM | POA: Diagnosis present

## 2023-07-28 LAB — ECHOCARDIOGRAM COMPLETE
AR max vel: 2.15 cm2
AV Area VTI: 2.64 cm2
AV Area mean vel: 2.16 cm2
AV Mean grad: 4 mmHg
AV Peak grad: 9.1 mmHg
Ao pk vel: 1.51 m/s
Area-P 1/2: 2.63 cm2
Calc EF: 72.8 %
Height: 64 in
MV VTI: 2.75 cm2
S' Lateral: 2.4 cm
Single Plane A2C EF: 74.5 %
Single Plane A4C EF: 70.3 %
Weight: 3647.29 [oz_av]

## 2023-07-28 LAB — LIPID PANEL
Cholesterol: 91 mg/dL (ref 0–200)
HDL: 32 mg/dL — ABNORMAL LOW (ref >40)
LDL Cholesterol: 27 mg/dL (ref 0–99)
Total CHOL/HDL Ratio: 2.8 ratio
Triglycerides: 161 mg/dL — ABNORMAL HIGH (ref <150)
VLDL: 32 mg/dL (ref 0–40)

## 2023-07-28 LAB — RAPID URINE DRUG SCREEN, HOSP PERFORMED
Amphetamines: NOT DETECTED
Barbiturates: NOT DETECTED
Benzodiazepines: NOT DETECTED
Cocaine: NOT DETECTED
Opiates: NOT DETECTED
Tetrahydrocannabinol: NOT DETECTED

## 2023-07-28 LAB — HEMOGLOBIN A1C
Hgb A1c MFr Bld: 6.5 % — ABNORMAL HIGH (ref 4.8–5.6)
Mean Plasma Glucose: 139.85 mg/dL

## 2023-07-28 NOTE — TOC Transition Note (Signed)
 Transition of Care [facility]) - Discharge Note   Patient Details  Name: Karen Potter MRN: [id number] Date of Birth: Apr 25, 1941  [facility] CM/SW Contact:  Beather Arbour Phone Number: 07/28/2023, 11:13 AM   Clinical Narrative:    This Clinical research associate spoke with patient and shared with her PT recommendation for HHPT. Patient agreeable to HHPT and CSW arranging it with [facility] . Cory with [facility] notified and can accept referral. TOC signing off.    Final next level of care: Home w Home Health Services Barriers to Discharge: Barriers Resolved   Patient Goals and CMS Choice Patient states their goals for this hospitalization and ongoing recovery are:: return back home CMS Medicare.gov Compare Post Acute Care list provided to:: Patient Choice offered to / list presented to : Patient      Discharge Placement              Patient chooses bed at:  (Patient is DC back home) Patient to be transferred to facility by: POV: Spouse Name of family member notified: Patient - [name] Patient and family notified of of transfer: 07/28/23  Discharge Plan and Services Additional resources added to the After Visit Summary for                  DME Arranged: N/A DME Agency: NA       HH Arranged: PT HH Agency: [facility] Date [facility] Agency Contacted: 07/28/23 Time HH Agency Contacted: 1112 Representative spoke with at [facility] Agency: Kandee Keen  Social Drivers of Health (SDOH) Interventions SDOH Screenings   Food Insecurity: No Food Insecurity (07/27/2023)  Housing: Low Risk  (07/27/2023)  Transportation Needs: No Transportation Needs (07/27/2023)  Utilities: Not At Risk (07/27/2023)  Alcohol Screen: Low Risk  (12/08/2022)  Depression (PHQ2-9): Low Risk  (04/18/2023)  Financial Resource Strain: Low Risk  (12/08/2022)  Physical Activity: Inactive (12/08/2022)  Social Connections: Socially Integrated (07/27/2023)  Stress: No Stress Concern Present (12/08/2022)  Tobacco Use: Low Risk   (07/20/2023)  Health Literacy: Adequate Health Literacy (12/08/2022)     Readmission Risk Interventions    07/28/2023   11:10 AM 07/28/2023   10:36 AM  Readmission Risk Prevention Plan  Medication Screening Complete Complete  Transportation Screening Complete Complete

## 2023-07-28 NOTE — Discharge Summary (Signed)
 Physician Discharge Summary  Karen Potter FAO:[id number] DOB: 1940-10-10 DOA: 07/27/2023  PCP: Dettinger, Elige Radon, MD  Admit date: 07/27/2023 Discharge date: 07/28/2023  Admitted From: Home. Disposition: Home  Recommendations for Outpatient Follow-up:  Follow up with PCP in 1-2 weeks Follow up with neurology as scheduled  Home Health: None. Equipment/Devices: None  Discharge Condition:Stable  CODE STATUS: Full  Diet recommendation:  Low salt low fat low carb diet   Brief/Interim Summary: Karen Potter is a 83 y.o. female with medical history significant for diabetes mellitus, hypertension, OSA on CPAP, TIA. Patient presented to the ED reports of difficulty getting out her words, and slurred speech.  Patient admitted as above with acute aphasia concerning for CVA given her history.  CT initially did not show any acute findings, MRI shows 3 mm subacute hemorrhage in the posterior temporal lobe.  Neurology consulted indicating this is not an unexpected finding given her history.  Repeat CT is recommended -repeat imaging confirmed prior findings with no acute evolution of bleed. She otherwise is healthy after insulin, echocardiogram shows no obvious signs of embolic disease - no events noted on telemetry. Given unremarkable workup neurology recommends no medication changes and continued outpatient follow-up with her PCP and neurology.  Patient is otherwise stable and agreeable for discharge home.   Discharge Diagnoses:  Principal Problem:   Aphasia Active Problems:   OBSTRUCTIVE SLEEP APNEA   Hypertension associated with diabetes (HCC)   Hypothyroid   Type 2 diabetes mellitus with other specified complication (HCC)   Slurred speech   Acute CVA (cerebrovascular accident) (HCC)  Aphasia, rule out TIA  -Symptom resolved in the ED, neurology notes imaging is consistent with nonacute microhemorrhage, likely incidental finding.  TEE unremarkable -No changes to medical therapy,  lengthy discussion in regards to secondary prevention: -A1c 6.5, triglyceride level elevated, HDL low -Discussed improving lifestyle and dietary habits -Continue statin, Plavix.  No indication for concurrent aspirin     Type 2 diabetes mellitus controlled -A1c 6.5 -Resume Januvia Farxiga   Hypothyroid Recent TSH low at 0.38 on Synthroid, recommend repeat testing and titration of medications as appropriate per PCP   Hypertension associated with diabetes (HCC) -Continue home medications, no changes   OBSTRUCTIVE SLEEP APNEA CPAP at bedtime  Discharge Instructions   Allergies as of 07/28/2023       Reactions   Codeine Itching   Dilaudid [hydromorphone] Other (See Comments)   Mouth blisters   Other Itching   Most pain meds cause itching.  When has to take pain medication, she has been instructed to take Benadryl   Hydrocodone-acetaminophen Other (See Comments)   Doxycycline Rash, Other (See Comments)        Medication List     TAKE these medications    acetaminophen 650 MG CR tablet Commonly known as: TYLENOL Take 650 mg by mouth in the morning.   allopurinol 100 MG tablet Commonly known as: ZYLOPRIM Take 1 tablet (100 mg total) by mouth daily.   Blood Glucose Monitor System w/Device Kit Use glucose meter to check blood sugar up to 3 times daily as instructed. One Touch Verio preferred by insurance. 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   clobetasol ointment 0.05 % Commonly known as: TEMOVATE Apply 1 Application topically 2 (two) times daily.   clopidogrel 75 MG tablet Commonly known as: PLAVIX Take 1 tablet (75 mg total)  by mouth every evening.   clotrimazole-betamethasone cream Commonly known as: LOTRISONE Apply 1 Application topically daily.   dapagliflozin propanediol 10 MG Tabs tablet Commonly known as: FARXIGA Take 1 tablet (10 mg total) by mouth  daily before breakfast.   dicyclomine 10 MG capsule Commonly known as: BENTYL Take 20 mg by mouth 3 (three) times daily.   Elderberry 500 MG Caps Take 500 mg by mouth daily.   furosemide 40 MG tablet Commonly known as: LASIX Take 1 tablet (40 mg total) by mouth daily.   L-FORMULA LYSINE HCL PO Take 1 Dose by mouth daily.   Lancets Misc. Kit Use patient & insurance preferred lancet device to test blood glucose up to 3 times daily for Dx: E11.69   levothyroxine 112 MCG tablet Commonly known as: SYNTHROID Take 1 tablet (112 mcg total) by mouth daily.   losartan 25 MG tablet Commonly known as: COZAAR Take 1 tablet (25 mg total) by mouth daily.   nystatin powder Commonly known as: MYCOSTATIN/NYSTOP APPLY  POWDER TOPICALLY TO AFFECTED AREA THREE TIMES DAILY   OneTouch Verio test strip Generic drug: glucose blood USE 1 STRIP TO CHECK GLUCOSE UP TO THREE TIMES DAILY AS  INSTRUCTED   pantoprazole 40 MG tablet Commonly known as: PROTONIX Take 1 tablet (40 mg total) by mouth daily.   rosuvastatin 5 MG tablet Commonly known as: CRESTOR Take 1 tablet by mouth once daily   sitaGLIPtin 100 MG tablet Commonly known as: Januvia Take 1 tablet (100 mg total) by mouth daily.   Vitamin D3 50 MCG (2000 UT) capsule Take 2,000 Units by mouth 2 (two) times daily.        Follow-up Information     [facility] Follow up.   Why: HHPT will call to schedule first home visit.               Allergies  Allergen Reactions   Codeine Itching   Dilaudid [Hydromorphone] Other (See Comments)    Mouth blisters   Other Itching    Most pain meds cause itching.  When has to take pain medication, she has been instructed to take Benadryl   Hydrocodone-Acetaminophen Other (See Comments)   Doxycycline Rash and Other (See Comments)    Consultations: Neurology  Procedures/Studies: EEG adult Result Date: 07/27/2023 Charlsie Quest, MD     07/27/2023  8:35 PM Patient Name:  Karen Potter MRN: [id number] Epilepsy Attending: Charlsie Quest Referring Physician/Provider: Jacalyn Lefevre, MD Date: 07/27/2023 Duration: 21.38 mins Patient history: 83yo F with episode of difficulty getting out her words, and slurred speech. EEG to evaluate for seizure. Level of alertness: Awake AEDs during EEG study: None Technical aspects: This EEG study was done with scalp electrodes positioned according to the 10-20 International system of electrode placement. Electrical activity was reviewed with band pass filter of 1-70Hz , sensitivity of 7 uV/mm, display speed of 31mm/sec with a 60Hz  notched filter applied as appropriate. EEG data were recorded continuously and digitally stored.  Video monitoring was available and reviewed as appropriate. Description: The posterior dominant rhythm consists of 8 Hz activity of moderate voltage (25-35 uV) seen predominantly in posterior head regions, symmetric and reactive to eye opening and eye closing. Hyperventilation and photic stimulation were not performed.   IMPRESSION: This study is within normal limits. No seizures or epileptiform discharges were seen throughout the recording. A normal interictal EEG does not exclude the diagnosis of epilepsy. Priyanka Annabelle Harman   CT HEAD WO CONTRAST ( ) Result  Date: 07/27/2023 CLINICAL DATA:  Stroke, follow up. EXAM: CT HEAD WITHOUT CONTRAST TECHNIQUE: Contiguous axial images were obtained from the base of the skull through the vertex without intravenous contrast. RADIATION DOSE REDUCTION: This exam was performed according to the departmental dose-optimization program which includes automated exposure control, adjustment of the mA and/or kV according to patient size and/or use of iterative reconstruction technique. COMPARISON:  Head CT and MRI earlier today FINDINGS: Brain: There is a 6 mm focus of subtle hypodensity in the posterior right temporal lobe (series 5, image 17) corresponding to the suspected small subacute  hemorrhage and minimal edema on MRI. No acute intracranial hemorrhage, acute large territory infarct, mass, midline shift, or extra-axial fluid collection is identified. Cerebral volume is normal. The ventricles are normal in size. Vascular: Calcified atherosclerosis at the skull base. No hyperdense vessel. Skull: No acute fracture or suspicious lesion. Sinuses/Orbits: Moderate mucosal thickening in the left maxillary sinus. Trace mastoid effusions. Bilateral cataract extraction. Other: None. IMPRESSION: 1. 6 mm subtle hypodensity in the posterior right temporal lobe likely reflecting a late subacute microhemorrhage as described on MRI. 2. No new intracranial abnormality. Electronically Signed   By: Sebastian Ache M.D.   On: 07/27/2023 18:52   MR BRAIN WO CONTRAST Result Date: 07/27/2023 CLINICAL DATA:  Neuro deficit, acute, stroke suspected. EXAM: MRI HEAD WITHOUT CONTRAST TECHNIQUE: Multiplanar, multiecho pulse sequences of the brain and surrounding structures were obtained without intravenous contrast. COMPARISON:  Head CT and CTA 07/27/2023.  Head MRI 06/15/2021. FINDINGS: Brain: There is no evidence of an acute infarct, mass, midline shift, or extra-axial fluid collection. A few chronic microhemorrhages are present in the posterior right cerebral hemisphere. A 3 mm hemorrhage in the posterior right temporal lobe demonstrates intrinsic T1 hyperintensity and minimal edema with subtle hypodensity in this area on CT suggestive of a subacute time course. No significant chronic small vessel ischemia is evident in the cerebral white matter for age. Mild cerebral atrophy is within normal limits for age. Vascular: Major intracranial vascular flow voids are preserved. Skull and upper cervical spine: Unremarkable bone marrow signal. Sinuses/Orbits: Bilateral cataract extraction. Moderate circumferential mucosal thickening in the left maxillary sinus. Small bilateral mastoid effusions. Other: None. IMPRESSION: 1.  Suspected 3 mm subacute hemorrhage in the posterior right temporal lobe. Minimal edema without mass effect. 2. No acute infarct. Electronically Signed   By: Sebastian Ache M.D.   On: 07/27/2023 17:35   CT ANGIO HEAD NECK W WO CM (CODE STROKE) Result Date: 07/27/2023 CLINICAL DATA:  Provided history: Neuro deficit, acute, stroke suspected. Additional history provided: New onset of slurred speech. EXAM: CT ANGIOGRAPHY HEAD AND NECK WITH AND WITHOUT CONTRAST TECHNIQUE: Multidetector CT imaging of the head and neck was performed using the standard protocol during bolus administration of intravenous contrast. Multiplanar CT image reconstructions and MIPs were obtained to evaluate the vascular anatomy. Carotid stenosis measurements (when applicable) are obtained utilizing NASCET criteria, using the distal internal carotid diameter as the denominator. RADIATION DOSE REDUCTION: This exam was performed according to the departmental dose-optimization program which includes automated exposure control, adjustment of the mA and/or kV according to patient size and/or use of iterative reconstruction technique. CONTRAST:  75mL OMNIPAQUE IOHEXOL 350 MG/ML SOLN COMPARISON:  MRI brain and MRA head 06/15/2021. FINDINGS: CTA NECK FINDINGS Aortic arch: Standard aortic branching. Mild sclerotic plaque within the visualized aortic arch. Streak/beam hardening artifact arising from a dense contrast bolus partially obscures the left subclavian artery. Within this limitation, there is no appreciable hemodynamically  significant innominate or proximal subclavian artery stenosis. Right carotid system: CCA and ICA patent within the neck without stenosis. Mild atherosclerotic plaque about the carotid bifurcation. Left carotid system: CCA and ICA patent within the neck without stenosis. Minimal atherosclerotic plaque about the carotid bifurcation. Vertebral arteries: Codominant and patent within the neck without stenosis. Skeleton: Spondylosis of  the cervical and visualized upper thoracic levels. C4-C5 vertebral ankylosis. Other neck: No neck mass or cervical lymphadenopathy. Prior thyroidectomy. Upper chest: No consolidation within the imaged lung apices. Review of the MIP images confirms the above findings CTA HEAD FINDINGS Anterior circulation: The intracranial internal carotid arteries are patent. Nonstenotic atherosclerotic plaque within both vessels. The M1 middle cerebral arteries are patent. No M2 proximal branch occlusion or high-grade proximal stenosis. The anterior cerebral arteries are patent. Mildly hypoplastic right A1 segment. No intracranial aneurysm is identified. Posterior circulation: The intracranial vertebral arteries are patent. Nonstenotic atherosclerotic plaque within the intracranial right vertebral artery. The basilar artery is patent. The posterior cerebral arteries are patent. Posterior communicating arteries are diminutive or absent, bilaterally. Venous sinuses: Within the limitations of contrast timing, no convincing thrombus. Anatomic variants: As described. Review of the MIP images confirms the above findings No emergent large vessel occlusion identified. These results were communicated to Dr. Amada Jupiter at 2:24 pmon 3/19/2025by text page via the [facility] messaging system. IMPRESSION: CTA neck: 1. The common carotid, internal carotid and vertebral arteries are patent within the neck without stenosis. Mild atherosclerotic plaque about the carotid bifurcations, bilaterally. 2. Aortic Atherosclerosis (ICD10-I70.0). CTA head: No proximal intracranial large vessel occlusion or high-grade proximal arterial stenosis identified. Electronically Signed   By: Jackey Loge D.O.   On: 07/27/2023 14:25   CT HEAD CODE STROKE WO CONTRAST Result Date: 07/27/2023 CLINICAL DATA:  Code stroke. Provided history: Neuro deficit, acute, stroke suspected. Additional history provided: Altered mental status. EXAM: CT HEAD WITHOUT CONTRAST TECHNIQUE:  Contiguous axial images were obtained from the base of the skull through the vertex without intravenous contrast. RADIATION DOSE REDUCTION: This exam was performed according to the departmental dose-optimization program which includes automated exposure control, adjustment of the mA and/or kV according to patient size and/or use of iterative reconstruction technique. COMPARISON:  MRI brain and MRA head 06/15/2021. Head CT 06/14/2021. FINDINGS: Brain: Generalized cerebral atrophy. There is no acute intracranial hemorrhage. No demarcated cortical infarct. No extra-axial fluid collection. No intracranial mass effect or midline shift. Vascular: No hyperdense vessel.  Atherosclerotic calcifications. Skull: No calvarial fracture or aggressive osseous lesion. Sinuses/Orbits: No mass or acute finding within the imaged orbits. Severe mucosal thickening within the left maxillary sinus. ASPECTS ([place]berta Stroke Program Early CT Score) - Ganglionic level infarction (caudate, lentiform nuclei, internal capsule, insula, M1-M3 cortex): 7 - Supraganglionic infarction (M4-M6 cortex): 3 Total score (0-10 with 10 being normal): 10 These results were communicated to Dr. Amada Jupiter at 2:07 pmon 3/19/2025by text page via the [facility] messaging system. IMPRESSION: 1. No evidence of an acute intracranial abnormality. 2. Cerebral atrophy. 3. Severe left maxillary sinusitis. Electronically Signed   By: Jackey Loge D.O.   On: 07/27/2023 14:07     Subjective: No acute issues or events overnight, back to baseline otherwise requesting discharge home   Discharge Exam: Vitals:   07/28/23 0945 07/28/23 1000  BP: 116/78 127/66  Pulse: 68 69  Resp: 19 16  Temp:    SpO2: 95% 93%   Vitals:   07/28/23 0915 07/28/23 0930 07/28/23 0945 07/28/23 1000  BP: (!) 100/59 101/67 116/78 127/66  Pulse: 71 69 68 69  Resp: 18 17 19 16   Temp:      TempSrc:      SpO2: 91% 95% 95% 93%  Weight:      Height:        General: Pt is alert, awake,  not in acute distress Cardiovascular: RRR, S1/S2 +, no rubs, no gallops Respiratory: CTA bilaterally, no wheezing, no rhonchi Abdominal: Soft, NT, ND, bowel sounds + Extremities: no edema, no cyanosis    The results of significant diagnostics from this hospitalization (including imaging, microbiology, ancillary and laboratory) are listed below for reference.     Microbiology: No results found for this or any previous visit (from the past 240 hours).   Labs: BNP (last 3 results) No results for input(s): "BNP" in the last 8760 hours. Basic Metabolic Panel: Recent Labs  Lab 07/27/23 1413 07/27/23 1415  NA 138 142  K 3.4* 3.4*  CL 101 102  CO2 27  --   GLUCOSE 165* 163*  BUN 12 11  CREATININE 0.97 1.00  CALCIUM 8.9  --    Liver Function Tests: Recent Labs  Lab 07/27/23 1413  AST 19  ALT 17  ALKPHOS 75  BILITOT 0.8  PROT 6.9  ALBUMIN 3.4*    CBC: Recent Labs  Lab 07/27/23 1413 07/27/23 1415  WBC 7.1  --   NEUTROABS 4.0  --   HGB 14.0 14.6  HCT 42.0 43.0  MCV 91.9  --   PLT 251  --    CBG: Recent Labs  Lab 07/27/23 1345  GLUCAP 153*   Hgb A1c Recent Labs    07/27/23 1413  HGBA1C 6.5*   Lipid Profile Recent Labs    07/28/23 0319  CHOL 91  HDL 32*  LDLCALC 27  TRIG 010*  CHOLHDL 2.8   Urinalysis    Component Value Date/Time   COLORURINE YELLOW 06/14/2021 1358   APPEARANCEUR Clear 09/15/2021 1118   LABSPEC <1.005 (L) 06/14/2021 1358   PHURINE 6.0 06/14/2021 1358   GLUCOSEU Negative 09/15/2021 1118   HGBUR TRACE (A) 06/14/2021 1358   BILIRUBINUR Negative 09/15/2021 1118   KETONESUR NEGATIVE 06/14/2021 1358   PROTEINUR Trace (A) 09/15/2021 1118   PROTEINUR NEGATIVE 06/14/2021 1358   UROBILINOGEN negative 06/01/2013 1142   UROBILINOGEN 0.2 04/08/2012 0045   NITRITE Negative 09/15/2021 1118   NITRITE NEGATIVE 06/14/2021 1358   LEUKOCYTESUR 1+ (A) 09/15/2021 1118   LEUKOCYTESUR SMALL (A) 06/14/2021 1358   Sepsis Labs Recent Labs   Lab 07/27/23 1413  WBC 7.1   Microbiology No results found for this or any previous visit (from the past 240 hours).   Time coordinating discharge: Over 30 minutes  SIGNED:   Azucena Fallen, DO [facility] 07/28/2023, 1:01 PM Pager   If 7PM-7AM, please contact night-coverage [email or web address]

## 2023-07-28 NOTE — Progress Notes (Signed)
 SLP Cancellation Note  Patient Details Name: Karen Potter MRN: [id number] DOB: 1941-05-02   Cancelled treatment:       Reason Eval/Treat Not Completed: SLP screened, no needs identified, will sign off: Pt screened in ED and she and her husband report that she is back to baseline in terms of cognitive linguistic skills and no change in swallowing, aphasia resolved.   Thank you,  [name], CCC-SLP [phone]    [name] 07/28/2023, 1:55 PM

## 2023-07-28 NOTE — Progress Notes (Signed)
  Echocardiogram 2D Echocardiogram has been performed.  Ocie Doyne RDCS 07/28/2023, 1:29 PM

## 2023-07-28 NOTE — Care Management CC44 (Signed)
 Condition Code 44 Documentation Completed  Patient Details  Name: Karen Potter MRN: [id number] Date of Birth: Sep 23, 1940   Condition Code 44 given:  Yes Patient signature on Condition Code 44 notice:  Yes Documentation of 2 MD's agreement:  Yes Code 44 added to claim:  Yes    Isabella Bowens, LCSWA 07/28/2023, 12:28 PM

## 2023-07-28 NOTE — H&P (Addendum)
 Physical Therapy Evaluation Patient Details Name: Karen Potter MRN: [id number] DOB: 1941-03-15 Today's Date: 07/28/2023  History of Present Illness  Karen Potter is a 83 y.o. female with medical history significant for diabetes mellitus, hypertension, OSA on CPAP, TIA.  Patient presented to the ED reports of difficulty getting out her words, and slurred speech.  She has a history of TIA and has had similar speech difficulty with prior TIA episodes.  Patient reports she was trying to communicate with her husband when suddenly she could not get her words out, and the words she was able to get out were gabbled.  Husband is at bedside and confirms this history.  No facial asymmetry, no weakness of extremities, no change in vision, no normal sensation.  She is on Plavix and compliant.   Clinical Impression  Pt demonstrates ability to perform all bed mobility and functional transfers with modified independence, increased time required. Pt also demonstrates independence with ambulation but does demonstrate decreased steadiness on feet, however no LOB and no need for AD. Pt is near baseline and is encouraged to walk with mobility and nursing. Patient discharged from physical therapy to care of nursing for ambulation daily as tolerated for length of stay.       If plan is discharge home, recommend the following: A little help with walking and/or transfers;A little help with bathing/dressing/bathroom;Assistance with cooking/housework;Help with stairs or ramp for entrance   Can travel by private vehicle        Equipment Recommendations None recommended by PT  Recommendations for Other Services       Functional Status Assessment Patient has had a recent decline in their functional status and demonstrates the ability to make significant improvements in function in a reasonable and predictable amount of time.     Precautions / Restrictions Precautions Precautions: Fall Recall of  Precautions/Restrictions: Intact Restrictions Weight Bearing Restrictions Per Provider Order: No      Mobility  Bed Mobility Overal bed mobility: Independent                  Transfers Overall transfer level: Independent Equipment used: None               General transfer comment: Supervision and managment of lines.    Ambulation/Gait Ambulation/Gait assistance: Modified independent (Device/Increase time) (increased time, decreased step length on left, no LOB) Gait Distance (Feet): 125 Feet Assistive device: None Gait Pattern/deviations: WFL(Within Functional Limits), Decreased step length - right, Decreased step length - left, Decreased stride length Gait velocity: decreased        Stairs            Wheelchair Mobility     Tilt Bed    Modified Rankin (Stroke Patients Only)       Balance Overall balance assessment: Independent                                           Pertinent Vitals/Pain Pain Assessment Pain Assessment: No/denies pain    Home Living Family/patient expects to be discharged to:: Private residence Living Arrangements: Spouse/significant other Available Help at Discharge: Family Type of Home: House Home Access: Stairs to enter   Secretary/administrator of Steps: 6 Alternate Level Stairs-Number of Steps: 12 Home Layout: Multi-level Home Equipment: Agricultural consultant (2 wheels);Wheelchair - manual      Prior Function Prior Level of Function :  Independent/Modified Independent             Mobility Comments: Pt modified independent with cane and walker/shopping cart ADLs Comments: Pt requires occassional assistance from spouse.     Extremity/Trunk Assessment   Upper Extremity Assessment Upper Extremity Assessment: Overall WFL for tasks assessed    Lower Extremity Assessment Lower Extremity Assessment: Overall WFL for tasks assessed;Generalized weakness    Cervical / Trunk Assessment Cervical /  Trunk Assessment: Kyphotic  Communication   Communication Communication: No apparent difficulties    Cognition Arousal: Alert Behavior During Therapy: WFL for tasks assessed/performed   PT - Cognitive impairments: No apparent impairments                         Following commands: Intact       Cueing Cueing Techniques: Verbal cues, Visual cues     General Comments      Exercises     Assessment/Plan    PT Assessment All further PT needs can be met in the next venue of care  PT Problem List Decreased strength;Decreased activity tolerance;Decreased balance;Decreased mobility       PT Treatment Interventions      PT Goals (Current goals can be found in the Care Plan section)  Acute Rehab PT Goals Patient Stated Goal: to go home PT Goal Formulation: With patient Time For Goal Achievement: 08/11/23 Potential to Achieve Goals: Good    Frequency       Co-evaluation               AM-PAC PT "6 Clicks" Mobility  Outcome Measure Help needed turning from your back to your side while in a flat bed without using bedrails?: None Help needed moving from lying on your back to sitting on the side of a flat bed without using bedrails?: None Help needed moving to and from a bed to a chair (including a wheelchair)?: None Help needed standing up from a chair using your arms (e.g., wheelchair or bedside chair)?: None Help needed to walk in hospital room?: A Little Help needed climbing 3-5 steps with a railing? : A Little 6 Click Score: 22    End of Session Equipment Utilized During Treatment: Gait belt Activity Tolerance: Patient tolerated treatment well;No increased pain Patient left: in chair;with call bell/phone within reach Nurse Communication: Mobility status PT Visit Diagnosis: Unsteadiness on feet (R26.81);Other abnormalities of gait and mobility (R26.89);Muscle weakness (generalized) (M62.81)    Time: 1610-9604 PT Time Calculation (min) (ACUTE ONLY):  16 min   Charges:     PT Treatments $Therapeutic Activity: 8-22 mins PT General Charges $$ ACUTE PT VISIT: 1 Visit         Luz Lex, PT, DPT [facility] Office: [phone] 12:15 PM, 07/28/23

## 2023-07-28 NOTE — Progress Notes (Signed)
 Chart reviewed.   EEG is negative. Repeat CT confirms the microhemorrhage is not acute. My suspicion is this was simply an incidental finding. I would favor continuing single antiplatelet therapy with plavix. LDL of 27, so no need to change lipid management. A1C is pending and would recommend control of DM with goal < 7. Htn control with goal normotension.   If no embolic source on telemetry or echo, then no further recommendations from a neurology standpoint.   Neurology will be available as needed, please call with questions.   Ritta Slot, MD [facility]   If 7pm- 7am, please page neurology on call as listed in AMION.

## 2023-07-28 NOTE — Care Management Obs Status (Signed)
 MEDICARE OBSERVATION STATUS NOTIFICATION   Patient Details  Name: Karen Potter MRN: [id number] Date of Birth: 1940/05/18   Medicare Observation Status Notification Given:  Yes    Beather Arbour 07/28/2023, 12:28 PM

## 2023-07-29 ENCOUNTER — Ambulatory Visit: Payer: Self-pay

## 2023-07-29 ENCOUNTER — Ambulatory Visit: Admitting: Nurse Practitioner

## 2023-07-29 ENCOUNTER — Encounter: Payer: Self-pay | Admitting: Nurse Practitioner

## 2023-07-29 VITALS — BP 105/65 | HR 78 | Temp 98.0°F | Ht 64.0 in | Wt 225.0 lb

## 2023-07-29 DIAGNOSIS — J029 Acute pharyngitis, unspecified: Secondary | ICD-10-CM

## 2023-07-29 NOTE — Patient Instructions (Signed)
 Take medication as prescribe Cotton underwear Take shower not bath Cranberry juice, yogurt Force fluids AZO over the counter X2 days Culture pending RTO prn

## 2023-07-29 NOTE — Progress Notes (Signed)
 Subjective:    Patient ID: Karen Potter, female    DOB: January 03, 1941, 83 y.o.   MRN: [id number]   Chief Complaint: uri  Sore Throat  This is a new problem. The current episode started yesterday. The problem has been gradually worsening. There has been no fever. The pain is at a severity of 7/10. The pain is moderate. Associated symptoms include a hoarse voice and trouble swallowing. Pertinent negatives include no abdominal pain, coughing, ear pain, headaches, shortness of breath or swollen glands. She has had no exposure to strep. She has tried gargles and acetaminophen for the symptoms. The treatment provided mild relief.    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   Mixed conductive and sensorineural hearing loss of both ears 01/27/2018   Statin-induced myositis 08/09/2017   Obesity, Class III, BMI 40-49.9 (morbid obesity) (HCC) 10/28/2016   Primary hyperparathyroidism (HCC) 05/06/2015   Type 2 diabetes mellitus with other specified complication (HCC)    GERD (gastroesophageal reflux disease) 11/05/2013   Fatty liver 06/01/2013   Hypercalcemia 05/14/2013   Hypertension associated with diabetes (HCC)    Hypothyroid    Migraine    Polio    Seasonal allergies    Vitamin D deficiency    Aphasia 04/11/2012   OBSTRUCTIVE SLEEP APNEA 05/31/2008   Hyperlipidemia associated with type 2 diabetes mellitus (HCC) 05/31/2008   OVARIAN CYST 05/31/2008        Review of Systems  Constitutional:  Negative for diaphoresis.  HENT:  Positive for hoarse voice and trouble swallowing. Negative for ear pain.   Eyes:  Negative for pain.  Respiratory:  Negative for cough and shortness of breath.   Cardiovascular:  Negative for chest pain, palpitations and leg swelling.  Gastrointestinal:  Negative for abdominal pain.  Endocrine: Negative for polydipsia.  Skin:  Negative for rash.  Neurological:  Negative for dizziness, weakness and headaches.  Hematological:  Does not bruise/bleed easily.  All other systems reviewed and are negative.      Objective:   Physical Exam Constitutional:      Appearance: Normal appearance.  HENT:     Nose: No congestion or rhinorrhea.     Mouth/Throat:     Pharynx: No oropharyngeal exudate or posterior oropharyngeal erythema.  Cardiovascular:     Rate and Rhythm: Normal rate and regular rhythm.  Pulmonary:     Effort: Pulmonary effort is normal.     Breath sounds: Normal breath sounds.  Musculoskeletal:     Cervical back: Normal range of motion.  Lymphadenopathy:     Cervical: No cervical adenopathy.  Skin:    General: Skin is warm.  Neurological:     General: No focal deficit present.     Mental Status: She is alert and oriented to person, place, and time.  Psychiatric:        Mood and Affect: Mood normal.        Behavior: Behavior normal.    BP 105/65   Pulse 78   Temp  98 F (36.7 C) (Temporal)   Ht 5\' 4"  (1.626 m)   Wt 225 lb (102.1 kg)   SpO2 98%   BMI 38.62 kg/m         Assessment & Plan:   Karen Potter in today with chief complaint of Sore Throat   1. Pharyngitis, unspecified etiology (Primary) Force fluids Motrin or tylenol OTC OTC decongestant Throat lozenges if help New toothbrush in 3 days      The above assessment and management plan was discussed with the patient. The patient verbalized understanding of and has agreed to the management plan. Patient is  aware to call the clinic if symptoms persist or worsen. Patient is aware when to return to the clinic for a follow-up visit. Patient educated on when it is appropriate to go to the emergency department.   Karen Daphine Deutscher, FNP '

## 2023-07-29 NOTE — Telephone Encounter (Signed)
 Copied from CRM [id number]. Topic: Clinical - Red Word Triage >> Jul 29, 2023  8:47 AM Fuller Mandril wrote: Red Word that prompted transfer to Nurse Triage: Throat killing her, difficulty swallowing, White Saliva  Chief Complaint: severe sore throat Symptoms: difficulty swallowing due to pain, white saliva Frequency: woke up with this Pertinent Negatives: Patient denies fever, rash, cough Disposition: [] ED /[] Urgent Care (no appt availability in office) / [x] Appointment(In office/virtual)/ []  [facility]/ [] Home Care/ [] Refused Recommended Disposition /[] [place] [facility]/ []  Follow-up with PCP Additional Notes: per protocol apt made for today; care advice given, denies questions; instructed to go to ER if becomes worse.   Reason for Disposition  SEVERE (e.g., excruciating) throat pain  Answer Assessment - Initial Assessment Questions 1. ONSET: "When did the throat start hurting?" (Hours or days ago)      Last night 2. SEVERITY: "How bad is the sore throat?" (Scale 1-10; mild, moderate or severe)   - MILD (1-3):  Doesn't interfere with eating or normal activities.   - MODERATE (4-7): Interferes with eating some solids and normal activities.   - SEVERE (8-10):  Excruciating pain, interferes with most normal activities.   - SEVERE WITH DYSPHAGIA (10): Can't swallow liquids, drooling.     severe 3. STREP EXPOSURE: "Has there been any exposure to strep within the past week?" If Yes, ask: "What type of contact occurred?"      denies 4.  VIRAL SYMPTOMS: "Are there any symptoms of a cold, such as a runny nose, cough, hoarse voice or red eyes?"      Hoarse voice 5. FEVER: "Do you have a fever?" If Yes, ask: "What is your temperature, how was it measured, and when did it start?"     denies 6. PUS ON THE TONSILS: "Is there pus on the tonsils in the back of your throat?"     Possibly states is mucous that is white 7. OTHER SYMPTOMS: "Do you have any other symptoms?" (e.g., difficulty  breathing, headache, rash)     Denies.  8. PREGNANCY: "Is there any chance you are pregnant?" "When was your last menstrual period?"     na  Protocols used: Sore Throat-A-AH

## 2023-08-03 DIAGNOSIS — E1159 Type 2 diabetes mellitus with other circulatory complications: Secondary | ICD-10-CM | POA: Diagnosis not present

## 2023-08-03 DIAGNOSIS — Z8673 Personal history of transient ischemic attack (TIA), and cerebral infarction without residual deficits: Secondary | ICD-10-CM | POA: Diagnosis not present

## 2023-08-03 DIAGNOSIS — K760 Fatty (change of) liver, not elsewhere classified: Secondary | ICD-10-CM | POA: Diagnosis not present

## 2023-08-03 DIAGNOSIS — G4733 Obstructive sleep apnea (adult) (pediatric): Secondary | ICD-10-CM | POA: Diagnosis not present

## 2023-08-03 DIAGNOSIS — K219 Gastro-esophageal reflux disease without esophagitis: Secondary | ICD-10-CM | POA: Diagnosis not present

## 2023-08-03 DIAGNOSIS — G319 Degenerative disease of nervous system, unspecified: Secondary | ICD-10-CM | POA: Diagnosis not present

## 2023-08-03 DIAGNOSIS — I700 Atherosclerosis of aorta: Secondary | ICD-10-CM | POA: Diagnosis not present

## 2023-08-03 DIAGNOSIS — Z7984 Long term (current) use of oral hypoglycemic drugs: Secondary | ICD-10-CM | POA: Diagnosis not present

## 2023-08-03 DIAGNOSIS — Z981 Arthrodesis status: Secondary | ICD-10-CM | POA: Diagnosis not present

## 2023-08-03 DIAGNOSIS — J320 Chronic maxillary sinusitis: Secondary | ICD-10-CM | POA: Diagnosis not present

## 2023-08-03 DIAGNOSIS — I452 Bifascicular block: Secondary | ICD-10-CM | POA: Diagnosis not present

## 2023-08-03 DIAGNOSIS — I152 Hypertension secondary to endocrine disorders: Secondary | ICD-10-CM | POA: Diagnosis not present

## 2023-08-03 DIAGNOSIS — Z87442 Personal history of urinary calculi: Secondary | ICD-10-CM | POA: Diagnosis not present

## 2023-08-03 DIAGNOSIS — E785 Hyperlipidemia, unspecified: Secondary | ICD-10-CM | POA: Diagnosis not present

## 2023-08-03 DIAGNOSIS — Z7902 Long term (current) use of antithrombotics/antiplatelets: Secondary | ICD-10-CM | POA: Diagnosis not present

## 2023-08-03 DIAGNOSIS — D509 Iron deficiency anemia, unspecified: Secondary | ICD-10-CM | POA: Diagnosis not present

## 2023-08-03 DIAGNOSIS — E039 Hypothyroidism, unspecified: Secondary | ICD-10-CM | POA: Diagnosis not present

## 2023-08-03 DIAGNOSIS — E559 Vitamin D deficiency, unspecified: Secondary | ICD-10-CM | POA: Diagnosis not present

## 2023-08-03 DIAGNOSIS — Z96651 Presence of right artificial knee joint: Secondary | ICD-10-CM | POA: Diagnosis not present

## 2023-08-03 DIAGNOSIS — I493 Ventricular premature depolarization: Secondary | ICD-10-CM | POA: Diagnosis not present

## 2023-08-03 DIAGNOSIS — Z9181 History of falling: Secondary | ICD-10-CM | POA: Diagnosis not present

## 2023-08-03 DIAGNOSIS — Z86018 Personal history of other benign neoplasm: Secondary | ICD-10-CM | POA: Diagnosis not present

## 2023-08-04 ENCOUNTER — Encounter: Payer: Self-pay | Admitting: Family Medicine

## 2023-08-04 ENCOUNTER — Ambulatory Visit: Payer: PPO | Admitting: Family Medicine

## 2023-08-04 VITALS — BP 129/83 | HR 64 | Ht 64.0 in | Wt 226.0 lb

## 2023-08-04 DIAGNOSIS — I152 Hypertension secondary to endocrine disorders: Secondary | ICD-10-CM | POA: Diagnosis not present

## 2023-08-04 DIAGNOSIS — Z7984 Long term (current) use of oral hypoglycemic drugs: Secondary | ICD-10-CM | POA: Diagnosis not present

## 2023-08-04 DIAGNOSIS — E1140 Type 2 diabetes mellitus with diabetic neuropathy, unspecified: Secondary | ICD-10-CM | POA: Diagnosis not present

## 2023-08-04 DIAGNOSIS — E1159 Type 2 diabetes mellitus with other circulatory complications: Secondary | ICD-10-CM | POA: Diagnosis not present

## 2023-08-04 DIAGNOSIS — E039 Hypothyroidism, unspecified: Secondary | ICD-10-CM | POA: Diagnosis not present

## 2023-08-04 DIAGNOSIS — E785 Hyperlipidemia, unspecified: Secondary | ICD-10-CM

## 2023-08-04 DIAGNOSIS — E1169 Type 2 diabetes mellitus with other specified complication: Secondary | ICD-10-CM | POA: Diagnosis not present

## 2023-08-04 MED ORDER — SITAGLIPTIN PHOSPHATE 100 MG PO TABS: 100.0000 mg | ORAL_TABLET | Freq: Every day | ORAL | 3 refills | Status: AC

## 2023-08-04 MED ORDER — FUROSEMIDE 40 MG PO TABS: 40.0000 mg | ORAL_TABLET | Freq: Every day | ORAL | 3 refills | Status: AC

## 2023-08-04 MED ORDER — LOSARTAN POTASSIUM 25 MG PO TABS: 12.5000 mg | ORAL_TABLET | Freq: Every day | ORAL | 3 refills | Status: AC

## 2023-08-04 MED ORDER — ROSUVASTATIN CALCIUM 5 MG PO TABS: 5.0000 mg | ORAL_TABLET | Freq: Every day | ORAL | 3 refills | Status: AC

## 2023-08-04 NOTE — Progress Notes (Signed)
 BP 129/83   Pulse 64   Ht 5\' 4"  (1.626 m)   Wt 226 lb (102.5 kg)   SpO2 96%   BMI 38.79 kg/m    Subjective:   Patient ID: Karen Potter, female    DOB: 1940-11-07, 83 y.o.   MRN: [id number]  HPI: Karen Potter is a 83 y.o. female presenting on 08/04/2023 for Medical Management of Chronic Issues, Diabetes, and Hypertension   HPI Type 2 diabetes mellitus Patient comes in today for recheck of his diabetes. Patient has been currently taking [place] and [place]. Patient is currently on an ACE inhibitor/ARB. Patient has not seen an ophthalmologist this year. Patient denies any new issues with their feet. The symptom started onset as an adult hypertension and hyperlipidemia and hypothyroidism ARE RELATED TO DM   Hypertension Patient is currently on losartan and furosemide and carvedilol, and their blood pressure today is 129/83, she has been more recently when she was in the hospital after her TIA. Patient denies any lightheadedness or dizziness. Patient denies headaches, blurred vision, chest pains, shortness of breath, or weakness. Denies any side effects from medication and is content with current medication.   Hyperlipidemia and history of TIA Patient is coming in for recheck of his hyperlipidemia. The patient is currently taking Crestor and Plavix. They deny any issues with myalgias or history of liver damage from it. They deny any focal numbness or weakness or chest pain.   Hypothyroidism recheck Patient is coming in for thyroid recheck today as well. They deny any issues with hair changes or heat or cold problems or diarrhea or constipation. They deny any chest pain or palpitations. They are currently on levothyroxine 112 micrograms   Patient was in the emergency department on 07/27/2023 and observed overnight until 07/28/2023 for TIA  Relevant past medical, surgical, family and social history reviewed and updated as indicated. Interim medical history since our last visit  reviewed. Allergies and medications reviewed and updated.  Review of Systems  Constitutional:  Negative for fever.  HENT:  Negative for congestion, ear discharge and ear pain.   Eyes:  Negative for visual disturbance.  Respiratory:  Negative for chest tightness and shortness of breath.   Cardiovascular:  Negative for chest pain and leg swelling.  Genitourinary:  Negative for difficulty urinating and dysuria.  Musculoskeletal:  Negative for back pain and gait problem.  Skin:  Negative for rash.  Neurological:  Negative for dizziness, light-headedness and headaches.  Psychiatric/Behavioral:  Negative for agitation and behavioral problems.   All other systems reviewed and are negative.   Per HPI unless specifically indicated above   Allergies as of 08/04/2023       Reactions   Codeine Itching   Dilaudid [hydromorphone] Other (See Comments)   Mouth blisters   Other Itching   Most pain meds cause itching.  When has to take pain medication, she has been instructed to take Benadryl   Hydrocodone-acetaminophen Other (See Comments)   Doxycycline Rash, Other (See Comments)        Medication List        Accurate as of August 04, 2023 10:26 AM. If you have any questions, ask your nurse or doctor.          acetaminophen 650 MG CR tablet Commonly known as: TYLENOL Take 650 mg by mouth in the morning.   allopurinol 100 MG tablet Commonly known as: ZYLOPRIM Take 1 tablet (100 mg total) by mouth daily.   Blood Glucose Monitor System  w/Device Kit Use glucose meter to check blood sugar up to 3 times daily as instructed. One Touch Verio preferred by insurance. 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   clobetasol ointment 0.05 % Commonly known as: TEMOVATE Apply 1 Application topically 2 (two) times daily.   clopidogrel 75 MG tablet Commonly known as: PLAVIX Take 1 tablet (75 mg  total) by mouth every evening.   clotrimazole-betamethasone cream Commonly known as: LOTRISONE Apply 1 Application topically daily.   dapagliflozin propanediol 10 MG Tabs tablet Commonly known as: FARXIGA Take 1 tablet (10 mg total) by mouth daily before breakfast.   dicyclomine 10 MG capsule Commonly known as: BENTYL Take 20 mg by mouth 3 (three) times daily.   Elderberry 500 MG Caps Take 500 mg by mouth daily.   furosemide 40 MG tablet Commonly known as: LASIX Take 1 tablet (40 mg total) by mouth daily.   L-FORMULA LYSINE HCL PO Take 1 Dose by mouth daily.   Lancets Misc. Kit Use patient & insurance preferred lancet device to test blood glucose up to 3 times daily for Dx: E11.69   levothyroxine 112 MCG tablet Commonly known as: SYNTHROID Take 1 tablet (112 mcg total) by mouth daily.   losartan 25 MG tablet Commonly known as: COZAAR Take 0.5 tablets (12.5 mg total) by mouth daily. What changed: how much to take Changed by: Elige Radon Troi Bechtold   nystatin powder Commonly known as: MYCOSTATIN/NYSTOP APPLY  POWDER TOPICALLY TO AFFECTED AREA THREE TIMES DAILY   OneTouch Verio test strip Generic drug: glucose blood USE 1 STRIP TO CHECK GLUCOSE UP TO THREE TIMES DAILY AS  INSTRUCTED   pantoprazole 40 MG tablet Commonly known as: PROTONIX Take 1 tablet (40 mg total) by mouth daily.   rosuvastatin 5 MG tablet Commonly known as: CRESTOR Take 1 tablet (5 mg total) by mouth daily.   sitaGLIPtin 100 MG tablet Commonly known as: Januvia Take 1 tablet (100 mg total) by mouth daily.   Vitamin D3 50 MCG (2000 UT) capsule Take 2,000 Units by mouth 2 (two) times daily.         Objective:   BP 129/83   Pulse 64   Ht 5\' 4"  (1.626 m)   Wt 226 lb (102.5 kg)   SpO2 96%   BMI 38.79 kg/m   Wt Readings from Last 3 Encounters:  08/04/23 226 lb (102.5 kg)  07/29/23 225 lb (102.1 kg)  07/27/23 227 lb 15.3 oz (103.4 kg)    Physical Exam Vitals and nursing note  reviewed.  Constitutional:      General: She is not in acute distress.    Appearance: She is well-developed. She is not diaphoretic.  Eyes:     Conjunctiva/sclera: Conjunctivae normal.  Cardiovascular:     Rate and Rhythm: Normal rate and regular rhythm.     Heart sounds: Normal heart sounds. No murmur heard. Pulmonary:     Effort: Pulmonary effort is normal. No respiratory distress.     Breath sounds: Normal breath sounds. No wheezing.  Musculoskeletal:        General: No swelling.  Skin:    General: Skin is warm and dry.     Findings: No rash.  Neurological:     Mental Status: She is alert and oriented to person, place, and time.     Coordination: Coordination normal.  Psychiatric:  Behavior: Behavior normal.       Assessment & Plan:   Problem List Items Addressed This Visit       Cardiovascular and Mediastinum   Hypertension associated with diabetes (HCC) - Primary   Relevant Medications   furosemide (LASIX) 40 MG tablet   rosuvastatin (CRESTOR) 5 MG tablet   sitaGLIPtin (JANUVIA) 100 MG tablet   losartan (COZAAR) 25 MG tablet   Other Relevant Orders   CBC with Differential/Platelet   CMP14+EGFR     Endocrine   Hyperlipidemia associated with type 2 diabetes mellitus (HCC)   Relevant Medications   furosemide (LASIX) 40 MG tablet   rosuvastatin (CRESTOR) 5 MG tablet   sitaGLIPtin (JANUVIA) 100 MG tablet   losartan (COZAAR) 25 MG tablet   Other Relevant Orders   CBC with Differential/Platelet   CMP14+EGFR   Hypothyroid   Relevant Orders   TSH   Type 2 diabetes mellitus with other specified complication (HCC)   Relevant Medications   rosuvastatin (CRESTOR) 5 MG tablet   sitaGLIPtin (JANUVIA) 100 MG tablet   losartan (COZAAR) 25 MG tablet   Other Relevant Orders   CBC with Differential/Platelet   CMP14+EGFR   Type 2 diabetes mellitus with diabetic neuropathy, without long-term current use of insulin (HCC)   Relevant Medications   rosuvastatin  (CRESTOR) 5 MG tablet   sitaGLIPtin (JANUVIA) 100 MG tablet   losartan (COZAAR) 25 MG tablet   Other Relevant Orders   CBC with Differential/Platelet   CMP14+EGFR    BP a little on the lower side, we will have her start cutting the losartan in half and only taking half a tablet daily.  Continue other medicines as is currently.  Her A1c was tested in the hospital was 6.5. Follow up plan: Return in about 3 months (around 11/04/2023), or if symptoms worsen or fail to improve, for Diabetes and hypertension and hyperlipidemia and hypothyroidism.  Counseling provided for all of the vaccine components Orders Placed This Encounter  Procedures   CBC with Differential/Platelet   CMP14+EGFR   TSH    Arville Care, MD [facility] 08/04/2023, 10:26 AM

## 2023-08-05 ENCOUNTER — Telehealth: Payer: Self-pay

## 2023-08-05 LAB — CMP14+EGFR
ALT: 13 IU/L (ref 0–32)
AST: 22 IU/L (ref 0–40)
Albumin: 3.9 g/dL (ref 3.7–4.7)
Alkaline Phosphatase: 101 IU/L (ref 44–121)
BUN/Creatinine Ratio: 12 (ref 12–28)
BUN: 12 mg/dL (ref 8–27)
Bilirubin Total: 0.6 mg/dL (ref 0.0–1.2)
CO2: 23 mmol/L (ref 20–29)
Calcium: 9.1 mg/dL (ref 8.7–10.3)
Chloride: 103 mmol/L (ref 96–106)
Creatinine, Ser: 0.97 mg/dL (ref 0.57–1.00)
Globulin, Total: 2.9 g/dL (ref 1.5–4.5)
Glucose: 115 mg/dL — ABNORMAL HIGH (ref 70–99)
Potassium: 3.6 mmol/L (ref 3.5–5.2)
Sodium: 143 mmol/L (ref 134–144)
Total Protein: 6.8 g/dL (ref 6.0–8.5)
eGFR: 59 mL/min/{1.73_m2} — ABNORMAL LOW (ref >59)

## 2023-08-05 LAB — CBC WITH DIFFERENTIAL/PLATELET
Basophils Absolute: 0 10*3/uL (ref 0.0–0.2)
Basos: 0 %
EOS (ABSOLUTE): 0.2 10*3/uL (ref 0.0–0.4)
Eos: 2 %
Hematocrit: 43.4 % (ref 34.0–46.6)
Hemoglobin: 14.5 g/dL (ref 11.1–15.9)
Immature Grans (Abs): 0 10*3/uL (ref 0.0–0.1)
Immature Granulocytes: 0 %
Lymphocytes Absolute: 1.8 10*3/uL (ref 0.7–3.1)
Lymphs: 23 %
MCH: 30.5 pg (ref 26.6–33.0)
MCHC: 33.4 g/dL (ref 31.5–35.7)
MCV: 91 fL (ref 79–97)
Monocytes Absolute: 0.7 10*3/uL (ref 0.1–0.9)
Monocytes: 9 %
Neutrophils Absolute: 5.1 10*3/uL (ref 1.4–7.0)
Neutrophils: 66 %
Platelets: 270 10*3/uL (ref 150–450)
RBC: 4.76 x10E6/uL (ref 3.77–5.28)
RDW: 13.1 % (ref 11.7–15.4)
WBC: 7.9 10*3/uL (ref 3.4–10.8)

## 2023-08-05 LAB — MICROALBUMIN / CREATININE URINE RATIO
Creatinine, Urine: 105.5 mg/dL
Microalb/Creat Ratio: 9 mg/g{creat} (ref 0–29)
Microalbumin, Urine: 9.2 ug/mL

## 2023-08-05 LAB — TSH: TSH: 0.262 u[IU]/mL — ABNORMAL LOW (ref 0.450–4.500)

## 2023-08-05 NOTE — Telephone Encounter (Signed)
 Copied from CRM [id number]. Topic: General - Other >> Aug 05, 2023 11:27 AM Patsy Lager T wrote: Reason for CRM: patient requesting a callback from pharmacist. Please f/u with patient

## 2023-08-08 ENCOUNTER — Other Ambulatory Visit: Payer: Self-pay

## 2023-08-08 ENCOUNTER — Encounter: Payer: Self-pay | Admitting: Family Medicine

## 2023-08-08 DIAGNOSIS — E039 Hypothyroidism, unspecified: Secondary | ICD-10-CM

## 2023-08-08 MED ORDER — LEVOTHYROXINE SODIUM 100 MCG PO TABS: 100.0000 ug | ORAL_TABLET | Freq: Every day | ORAL | 1 refills | Status: DC

## 2023-08-08 NOTE — Telephone Encounter (Signed)
 Left message requesting call back to [phone], my direct line.

## 2023-08-12 ENCOUNTER — Ambulatory Visit

## 2023-08-12 DIAGNOSIS — E785 Hyperlipidemia, unspecified: Secondary | ICD-10-CM

## 2023-08-12 DIAGNOSIS — I452 Bifascicular block: Secondary | ICD-10-CM | POA: Diagnosis not present

## 2023-08-12 DIAGNOSIS — E039 Hypothyroidism, unspecified: Secondary | ICD-10-CM

## 2023-08-12 DIAGNOSIS — I493 Ventricular premature depolarization: Secondary | ICD-10-CM

## 2023-08-12 DIAGNOSIS — G4733 Obstructive sleep apnea (adult) (pediatric): Secondary | ICD-10-CM | POA: Diagnosis not present

## 2023-08-12 DIAGNOSIS — D509 Iron deficiency anemia, unspecified: Secondary | ICD-10-CM | POA: Diagnosis not present

## 2023-08-12 DIAGNOSIS — I152 Hypertension secondary to endocrine disorders: Secondary | ICD-10-CM | POA: Diagnosis not present

## 2023-08-12 DIAGNOSIS — E559 Vitamin D deficiency, unspecified: Secondary | ICD-10-CM | POA: Diagnosis not present

## 2023-08-12 DIAGNOSIS — K219 Gastro-esophageal reflux disease without esophagitis: Secondary | ICD-10-CM

## 2023-08-12 DIAGNOSIS — I700 Atherosclerosis of aorta: Secondary | ICD-10-CM

## 2023-08-12 DIAGNOSIS — K760 Fatty (change of) liver, not elsewhere classified: Secondary | ICD-10-CM | POA: Diagnosis not present

## 2023-08-12 DIAGNOSIS — E1159 Type 2 diabetes mellitus with other circulatory complications: Secondary | ICD-10-CM

## 2023-08-30 ENCOUNTER — Telehealth: Payer: Self-pay

## 2023-08-30 ENCOUNTER — Other Ambulatory Visit: Payer: Self-pay

## 2023-08-30 DIAGNOSIS — E1169 Type 2 diabetes mellitus with other specified complication: Secondary | ICD-10-CM

## 2023-08-30 NOTE — Telephone Encounter (Signed)
 Copied from CRM [id number]. Topic: Clinical - Medication Question >> Aug 30, 2023  9:38 AM Georgeann Kindred wrote: Reason for CRM: Patient is requesting to speak to Pharmacist in the office in regards to a medication that was prescribed to her that is expensive. Patient can be contacted at [phone]

## 2023-08-30 NOTE — Telephone Encounter (Signed)
 [facility] didn't recognize patient was enrolled for 2025. Spoke with someone on [employer] team and was told patient has 2 accounts (due to recent application being done online). Rep was able to process refill for medication, 3 refills remain. Shipment should process in 7-10 days. This will ship to her home.  Patient will need to call [phone] for all refills.

## 2023-08-30 NOTE — Telephone Encounter (Signed)
 Emailed [facility] to request refills for patients Januvia  medication (thru [facility]).

## 2023-08-30 NOTE — Telephone Encounter (Signed)
 Januvia  is going to be over $800. Pt was told at the end of November, early December that her pt assist would be renewed for 2025. A referral has not been placed for 2025. Will place today and will make [facility] aware. Per Ambrosio Bailey encounter she was going to renew on line for pt. Pt has never heard the outcome.  Pt has 16 pills to make it until renewal is completed.

## 2023-08-31 NOTE — Telephone Encounter (Signed)
 Pt made aware. Number provider. Pt will call back if needed.

## 2023-09-06 ENCOUNTER — Telehealth: Payer: Self-pay

## 2023-09-06 NOTE — Progress Notes (Signed)
 Care Guide Pharmacy Note  09/06/2023 Name: Karen Potter MRN: [id number] DOB: 11-10-1940  Referred By: Hilton Lucky, MD Reason for referral: Complex Care Management (Outreach to schedule with Pharm d )   Karen Potter is a 83 y.o. year old female who is a primary care patient of Dettinger, Lucio Sabin, MD.  [name] was referred to the pharmacist for assistance related to: DMII  An unsuccessful telephone outreach was attempted today to contact the patient who was referred to the pharmacy team for assistance with medication assistance. Additional attempts will be made to contact the patient.  [name] , RMA     [facility]  [facility], [facility] Guide  Direct Dial: [phone]  Website: [email or web address]

## 2023-09-06 NOTE — Progress Notes (Signed)
 Care Guide Pharmacy Note  09/06/2023 Name: Karen Potter MRN: [id number] DOB: 1940-05-11  Referred By: Hilton Lucky, MD Reason for referral: Complex Care Management (Outreach to schedule with Pharm d )   Karen Potter is a 83 y.o. year old female who is a primary care patient of Dettinger, Lucio Sabin, MD.  [name] was referred to the pharmacist for assistance related to: DMII  Patient no longer needs assistance at this time   [name] , RMA     [facility]  [facility], [facility] Guide  Direct Dial: [phone]  Website: [email or web address]

## 2023-09-08 ENCOUNTER — Telehealth: Payer: Self-pay | Admitting: Family Medicine

## 2023-09-08 NOTE — Telephone Encounter (Signed)
 Copied from CRM [id number]. Topic: Referral - Question >> Sep 08, 2023  9:23 AM Arlie Benedict B wrote: Reason for CRM: Homehealth plan of care needed, Order [id number], its a month out of mc compliant. Faxed over on 08/03/23 needed status  Need as soon as possiblbe. Jerlyn Moons [phone]

## 2023-09-15 ENCOUNTER — Telehealth: Payer: Self-pay

## 2023-09-15 NOTE — Telephone Encounter (Signed)
 Pt dropped of BP readings. Per Dr. Steen Eden she did have some lows but if she did not feel light headed or faint during those times then she may continue with current medications.   Confirmed with pt that she did not feel faint or light headed. She is aware to continue current medications.  Lows--11/64, 109/63. Most readings range from 130's or 70's.

## 2023-09-27 ENCOUNTER — Encounter: Payer: Self-pay | Admitting: Neurology

## 2023-09-27 ENCOUNTER — Ambulatory Visit: Admitting: Neurology

## 2023-09-27 VITALS — BP 125/73 | HR 79 | Ht 64.0 in | Wt 233.0 lb

## 2023-09-27 DIAGNOSIS — Z9181 History of falling: Secondary | ICD-10-CM

## 2023-09-27 DIAGNOSIS — G4733 Obstructive sleep apnea (adult) (pediatric): Secondary | ICD-10-CM | POA: Diagnosis not present

## 2023-09-27 DIAGNOSIS — R269 Unspecified abnormalities of gait and mobility: Secondary | ICD-10-CM | POA: Diagnosis not present

## 2023-09-27 DIAGNOSIS — Z9189 Other specified personal risk factors, not elsewhere classified: Secondary | ICD-10-CM

## 2023-09-27 DIAGNOSIS — R29818 Other symptoms and signs involving the nervous system: Secondary | ICD-10-CM

## 2023-09-27 NOTE — Patient Instructions (Signed)
 Please continue with your CPAP regularly.  Please use a cane for gait safety as you have fallen.  Also, if need be, you can use your 2 wheeled walker. Try to hydrate better with water , I recommend 8 cups of water  per day, 8 ounce size each.  Try to eliminate all soda as you drink regular soda and this adds calories and caffeine. Talk to your primary care about ways to lose weight. Please follow-up in 1 year to see Megan.

## 2023-09-27 NOTE — Progress Notes (Signed)
 Subjective:    Patient ID: Karen Potter is a 83 y.o. female.  HPI    Interim history:   Karen Potter is an 83 year old right-handed woman with an underlying complex medical history of type 2 diabetes, right bundle branch block, history of polio, osteoarthritis, hypothyroidism, hypertension, hyperlipidemia, history of hepatitis B, history of TIA, reflux disease, seasonal allergies, peripheral edema, type 2 diabetes, OSA on CPAP, and morbid obesity with BMI of over 40, who presents for FU consultation of her OSA and Hx of TIA.  The patient is accompanied by her husband today.     Today, 09/27/2023: She reports doing about the same, no new neurological symptoms since March 2025.  She reports recent fall, skinned her knees per husband.  No loss of consciousness or head injury reported.  She does not hydrate well with water , may drink 1 cup of water  per day, likes to drink regular soda, reports that she was told years ago not to drink any diet sodas because of history of polio.  She drinks cola throughout the day.  She has a cane and a walker available but does not use them.  She does report feeling that her balance is no longer as good.  She is not actively trying to lose weight.  She reports that she recently gained a little bit of weight.    She was hospitalized in March 2025.  I reviewed the hospital records including discharge summary.  She was admitted on 07/27/2023 and discharged on 07/28/2023.  She presented with difficulty getting her words out and slurring of speech.  She had stroke workup.  Symptoms resolved in the emergency room.  Her TEE was unremarkable.  A1c was 6.5.  She was on statin and Plavix  and continued on her medications.  She was advised to repeat her thyroid  function with her PCP.  She had a head CT without contrast on 07/27/2023 and I reviewed the results,   IMPRESSION: 1. 6 mm subtle hypodensity in the posterior right temporal lobe likely reflecting a late subacute  microhemorrhage as described on MRI. 2. No new intracranial abnormality.    In addition, I personally and independently reviewed images through the PACS system.  She had a brain MRI without contrast on 07/27/2023 and I reviewed the results:   IMPRESSION: 1. Suspected 3 mm subacute hemorrhage in the posterior right temporal lobe. Minimal edema without mass effect. 2. No acute infarct.   In addition, I personally and independently reviewed images through the PACS system.  She had a CT angiogram of the head and neck with and without contrast on 07/27/2023 and I have reviewed the results:   IMPRESSION: CTA neck:   1. The common carotid, internal carotid and vertebral arteries are patent within the neck without stenosis. Mild atherosclerotic plaque about the carotid bifurcations, bilaterally. 2. Aortic Atherosclerosis (ICD10-I70.0).   CTA head:   No proximal intracranial large vessel occlusion or high-grade proximal arterial stenosis identified.  She had an EEG on 07/27/2023 and I reviewed the results:    IMPRESSION: This study is within normal limits. No seizures or epileptiform discharges were seen throughout the recording.   A normal interictal EEG does not exclude the diagnosis of epilepsy.    The patient's allergies, current medications, family history, past medical history, past social history, past surgical history and problem list were reviewed and updated as appropriate.    Previously:   06/16/2022 Karen Currier, NP): <<Karen Potter is a 83 y.o. female with a history  of OSA on CPAP. Returns today for follow-up. Reports that she feels more sleepy. PCP changed her to Januvia  and told her that could be causing her to be sleepy. Hard time getting her glucose <180 despite changes in medication.  She plans to continue to discuss with her PCP.  She states that she has no trouble falling asleep at night and staying asleep.  Her download is below>>  06/18/2021 (SA): She  reports feeling at baseline as far as her neurological symptoms.  2 days ago she woke up with pain in her right big toe, similar to prior gout flareup.  She had a nurse visit recently at home and she is scheduled to see her primary care this afternoon.  She saw cardiology 2 days ago and I reviewed the note from 06/16/2021.  She is scheduled for an echocardiogram later this month and also to await Holter monitor.  As far as vascular risk factors, she does have severe obesity, a prior suspicion for TIAs, type 2 diabetes, hypertension, hyperlipidemia and sleep apnea.  She does not drink much in the way of water , estimates that she drinks about 3 or 4 glasses of water  per day, she drinks 2-3 bottles of regular soda.  She does not drink any alcohol, she does not smoke. She has noticed pallor in her fingers and purplish discoloration of her nails at times.  This is not painful, comes and goes.  Does not affect her feet typically.     She has a prior history of suspected TIA, was hospitalized in 2013 with an episode of a aphasia/confusion and had full work-up at the time.  She had seen Dr. Tilda Fogo in the past.  She was at one point suspected to have transient global amnesia as I understand.   She is compliant with her CPAP.  I evaluated her on 10/09/2019 for a suspected TIA in April 2020.  We talked about risk factor modification and weight loss at the time.  We proceeded with a brain MRI.  She had a brain MRI without contrast on 10/19/2018 and I reviewed the results: IMPRESSION: Unremarkable MRI brain. No acute or focal intracranial abnormality. Similar appearance to priors. She was advised of the test results via phone call.   She had admission to the hospital on 06/14/2021 with a transient episode of aphasia and confusion.  Symptoms had resolved by the time she was evaluated in the emergency room.  She was on aspirin  325 mg daily and was placed on dual antiplatelet therapy with the plan to continue with Plavix  after  3 weeks.  I reviewed the emergency room records as well as hospital records.  She had a head CT without contrast on 06/14/2021 and I reviewed the results: Impression: No acute intracranial abnormalities.  She had a brain MRI with and without contrast as well as MR angiogram of the head and MR angiogram of the neck on 06/14/2021 and I reviewed the results: IMPRESSION: No acute infarction, hemorrhage, or mass.   No large vessel occlusion, hemodynamically significant stenosis, or evidence of dissection.   Lipid panel from 06/15/2021 showed total cholesterol of 132, triglycerides elevated at 240, HDL 28, LDL 56.   She has been followed in this clinic for sleep apnea on a regular basis.  She saw Karen Currier, NP on 06/11/2021, at which time she was compliant with her CPAP of 12 cm with good apnea control.  Average usage of over 7 hours.   She saw Karen Currier, NP on 06/05/2020,  at which time she was compliant with her CPAP with good apnea control and average usage of nearly 8 hours.    She saw Karen Currier, NP on 06/05/2019, at which time she was fully compliant with her CPAP machine at 12 cm with an average usage of over 7 hours and very good apnea control.     I saw her on 05/31/2018 for sleep apnea follow-up, at which time she was fully compliant with her CPAP machine.  We talked about her sleep study results.  She had a recent interim hospitalization for salmonella enterocolitis and sepsis, complicated by dehydration and acute kidney injury.   I first met her on 01/10/2018 at the request of her primary care physician, at which time she reported a long-standing history of sleep apnea and being on CPAP therapy. She needed reevaluation and new equipment. She was invited for sleep study testing. She had a baseline sleep study, followed by a CPAP titration study. I went over her test results with her in detail today. Baseline sleep study from 01/16/2018 showed a sleep efficiency markedly reduced at 40.6%,  sleep latency was markedly delayed at 267 minutes, she had clear difficulty attaining sleep without using CPAP. She had an increased percentage of light stage sleep, REM sleep was absent. Total AHI was 30.3 per hour, average oxygen saturation 93%, nadir was 86% during non-REM sleep. She was invited for a CPAP titration study. She had this on 02/20/2018, sleep efficiency was 53.7%, sleep latency again delayed at 133 minutes. REM latency was 138.5 minutes, REM percentage reduced at 4.5%. She was titrated via full face mask as she is used to using a fullface mask. CPAP was titrated to a pressure of 10 cm. On the final pressure her AHI was 2.6 per hour with supine non-REM sleep achieved an O2 nadir of 87%. Based on her test results I suggested a home treatment pressure of 12 cm.   I reviewed her CPAP compliance data from 04/30/2018 through 05/29/2018, which is a total of 30 days, during which time she used her CPAP every night with percent used days greater than 4 hours at 100%, indicating superb compliance with an average usage of 7 hours and 10 minutes, residual AHI at goal at 1.1 per hour, leak on the high side with the 95th percentile at 24.2 L/m on a pressure of 12 cm with EPR of 1.    01/10/2018: (She) was previously diagnosed with obstructive sleep apnea and placed on CPAP therapy. She had sleep study testing in June 2003 with severe obstructive sleep apnea diagnosed with a baseline AHI of 92 per hour. She has been on CPAP. A compliance download was not possible today as she has an older machine. I reviewed your office note from 11/07/2017. Her Epworth sleepiness score is 15 out of 24, fatigue score is 60 out of 63. She complains of mouth dryness. 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 They have 4 children, 2 are adopted. She is a nonsmoker and does not typically utilize alcohol, she drinks caffeine in the form of soda, 2 to 3 bottles per day on average. She  has had SOB with minimal exertion since April. She has been on antibiotics and has had a steroid shot. She has had chest x-ray. Her chest x-ray from 11/07/2017 showed mildly enlarged cardiac silhouette with mild bibasilar atelectasis. She reports being fully compliant with CPAP therapy, pressure is at 16, she does  not like the ramp feature. She uses humidification and nasal mask which seems to fit well. She would not be opposed to retesting for sleep apnea. She does not feel that her shortness of breath during the day is related to her sleep apnea as she is fully compliant with treatment. Nevertheless, her Epworth sleepiness score is elevated. She also reports not always making enough time for sleep. She goes to bed around 2 and rise time varies. Sometimes she is up by 6 if she needs to and sometimes she may sleep until 10. She has nocturia about once per average night and denies morning headaches. She does not have a close family member with sleep apnea but some distant cousins. She had exposure to secondhand smoke when she was a child and also before her husband quit smoking. She had a tonsillectomy as a child. Her weight at the time of her sleep study in 2003 was 220, current weight of 258.  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  05-06-2015   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   age 36--  pt states was quarantined   no treatment ;  per pt no symptoms or issues since   History of kidney stones    History of TIA (transient ischemic attack)    03-23-2014  w/ episode temporay amnesia   Hyperlipidemia     Hypertension    Hypothyroidism    Iron deficiency anemia    LAFB (left anterior fascicular block)    OA (osteoarthritis)    OSA on CPAP    severe per study 2003   Polio    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   Post-polio limb muscle weakness    left leg  w/ 3 repair surgery's   RBBB (right bundle branch block)    Seasonal allergies    TIA (transient ischemic attack)    2023   Type 2 diabetes mellitus (HCC)    Vitamin D  deficiency     Her Past Surgical History Is Significant For: Past Surgical History:  Procedure Laterality Date   BACK SURGERY     CARPAL TUNNEL RELEASE Right 05/10/2007   CHOLECYSTECTOMY OPEN  05/11/1983   and Appendectomy   CYSTO/  RIGHT URETEROSCOPIC STONE EXTRACTON/  STENT PLACEMENT  06/ 2016   in [place]    CYSTOSCOPY/RETROGRADE/URETEROSCOPY/STONE EXTRACTION WITH BASKET Right 11/17/2015   Procedure: CYSTOSCOPY/RETROGRADE PYELOGRAM RIGHT/DIAGNOSTIC RIGHT URETEROSCOPY/LASER RENAL CALCIFICATION/RIGHT STENT PLACEMENT;  Surgeon: Marco Severs, MD;  Location: [facility];  Service: Urology;  Laterality: Right;   ESOPHAGOGASTRODUODENOSCOPY N/A 08/29/2015   Procedure: ESOPHAGOGASTRODUODENOSCOPY (EGD);  Surgeon: Alyce Jubilee, MD;  Location: [facility];  Service: Endoscopy;  Laterality: N/A;   HOLMIUM LASER APPLICATION Right 11/17/2015   Procedure: HOLMIUM LASER APPLICATION;  Surgeon: Marco Severs, MD;  Location: [facility];  Service: Urology;  Laterality: Right;   KNEE ARTHROSCOPY Right 05/10/1997   LEFT HEEL REPAIR SURGERY  1954;  1985;  1995   polio  LUMBAR SPINE SURGERY  09/01/2010   L4 -L5 fusion,  L3 laminectomy,  L5 - S1 foraminotomy   OVARIAN CYST SURGERY Right 05/10/1964   PARATHYROIDECTOMY N/A 05/06/2015   Procedure: PARATHYROIDECTOMY;  Surgeon: Oralee Billow, MD;  Location: [facility];  Service: General;  Laterality: N/A;   left superior   TONSILLECTOMY  as child   TOTAL ABDOMINAL HYSTERECTOMY W/  BILATERAL SALPINGOOPHORECTOMY  05/10/1982   TOTAL KNEE ARTHROPLASTY Right 04/08/2008   TRANSTHORACIC ECHOCARDIOGRAM  04/07/2012   grade 1 diastolic function,  ef 60-65%/  mild AV calcification without stenosis/  trivial MR    Her Family History Is Significant For: Family History  Problem Relation Age of Onset   Heart failure Mother        No details.  No MI   Cancer Father        Lung   Cancer Brother        Lung   Stroke Maternal Grandfather    Cancer Paternal Grandmother        rectal   Cancer Paternal Grandfather        gastric   Sleep apnea Neg Hx    Transient ischemic attack Neg Hx     Her Social History Is Significant For: Social History   Socioeconomic History   Marital status: Married    Spouse name: Mickey   Number of children: 4   Years of education: 13-some college   Highest education level: High school graduate  Occupational History   Occupation: Retired  Tobacco Use   Smoking status: Never   Smokeless tobacco: Never  Vaping Use   Vaping status: Never Used  Substance and Sexual Activity   Alcohol use: No   Drug use: No   Sexual activity: Not Currently    Birth control/protection: Surgical  Other Topics Concern   Not on file  Social History Narrative   Lives with husband.  Four children.     Drinks 1-2 cokes per day at least, but usually drinks water    All children live fairly close. Son is closest, but works 12-13 hours/day.   Disabled daughter in [place] that she helps care for.   Another daughter lives within 15 minutes and takes her shopping and to out of town doctor visits.   Social Drivers of Corporate investment banker Strain: Low Risk  (12/08/2022)   Overall Financial Resource Strain (CARDIA)    Difficulty of Paying Living Expenses: Not hard at all  Food Insecurity: No Food Insecurity (07/27/2023)   Hunger Vital Sign    Worried About Running Out of Food in the Last Year: Never true    Ran Out of Food in the Last Year: Never true   Transportation Needs: No Transportation Needs (07/27/2023)   PRAPARE - Administrator, Civil Service (Medical): No    Lack of Transportation (Non-Medical): No  Physical Activity: Inactive (12/08/2022)   Exercise Vital Sign    Days of Exercise per Week: 0 days    Minutes of Exercise per Session: 0 min  Stress: No Stress Concern Present (12/08/2022)   [employer] of Occupational Health - Occupational Stress Questionnaire    Feeling of Stress : Not at all  Social Connections: Socially Integrated (07/27/2023)   Social Connection and Isolation Panel [NHANES]    Frequency of Communication with Friends and Family: More than three times a week    Frequency of Social Gatherings with Friends and Family: More than three times a week  Attends Religious Services: More than 4 times per year    Active Member of Clubs or Organizations: Yes    Attends Banker Meetings: More than 4 times per year    Marital Status: Married    Her Allergies Are:  Allergies  Allergen Reactions   Codeine Itching   Dilaudid  [Hydromorphone ] Other (See Comments)    Mouth blisters   Other Itching    Most pain meds cause itching.  When has to take pain medication, she has been instructed to take Benadryl    Hydrocodone -Acetaminophen  Other (See Comments)   Doxycycline Rash and Other (See Comments)  :   Her Current Medications Are:  Outpatient Encounter Medications as of 09/27/2023  Medication Sig   acetaminophen  (TYLENOL ) 650 MG CR tablet Take 650 mg by mouth in the morning.   allopurinol  (ZYLOPRIM ) 100 MG tablet Take 1 tablet (100 mg total) by mouth daily.   Blood Glucose Monitoring Suppl (BLOOD GLUCOSE MONITOR SYSTEM) w/Device KIT Use glucose meter to check blood sugar up to 3 times daily as instructed. One Touch Verio preferred by insurance. Dx: EE11.69   carvedilol  (COREG ) 6.25 MG tablet Take 1 tablet (6.25 mg total) by mouth 2 (two) times daily with a meal.   Cholecalciferol (VITAMIN D3)  2000 UNITS capsule Take 2,000 Units by mouth 2 (two) times daily.    clobetasol  ointment (TEMOVATE ) 0.05 % Apply 1 Application topically 2 (two) times daily.   clopidogrel  (PLAVIX ) 75 MG tablet Take 1 tablet (75 mg total) by mouth every evening.   clotrimazole -betamethasone  (LOTRISONE ) cream Apply 1 Application topically daily.   dapagliflozin  propanediol (FARXIGA ) 10 MG TABS tablet Take 1 tablet (10 mg total) by mouth daily before breakfast.   dicyclomine  (BENTYL ) 10 MG capsule Take 20 mg by mouth 3 (three) times daily.   Elderberry 500 MG CAPS Take 500 mg by mouth daily.   furosemide  (LASIX ) 40 MG tablet Take 1 tablet (40 mg total) by mouth daily.   glucose blood (ONETOUCH VERIO) test strip USE 1 STRIP TO CHECK GLUCOSE UP TO THREE TIMES DAILY AS  INSTRUCTED   L-FORMULA LYSINE HCL PO Take 1 Dose by mouth daily.   Lancets Misc. KIT Use patient & insurance preferred lancet device to test blood glucose up to 3 times daily for Dx: E11.69   levothyroxine  (SYNTHROID ) 100 MCG tablet Take 1 tablet (100 mcg total) by mouth daily.   losartan  (COZAAR ) 25 MG tablet Take 0.5 tablets (12.5 mg total) by mouth daily.   nystatin  (MYCOSTATIN /NYSTOP ) powder APPLY  POWDER TOPICALLY TO AFFECTED AREA THREE TIMES DAILY   pantoprazole  (PROTONIX ) 40 MG tablet Take 1 tablet (40 mg total) by mouth daily.   rosuvastatin  (CRESTOR ) 5 MG tablet Take 1 tablet (5 mg total) by mouth daily.   sitaGLIPtin  (JANUVIA ) 100 MG tablet Take 1 tablet (100 mg total) by mouth daily.   No facility-administered encounter medications on file as of 09/27/2023.  :  Review of Systems:  Out of a complete 14 point review of systems, all are reviewed and negative with the exception of these symptoms as listed below:      Review of Systems  Neurological:        Pt here for TIA f/u  Pt states has questions about Plavix      Objective:  Neurological Exam  Physical Exam Physical Examination:   Vitals:   09/27/23 1312  BP: 125/73   Pulse: 79    General Examination: The patient is a very pleasant 83 y.o.  female in no acute distress. She appears well-developed and well-nourished and well groomed.   HEENT: Normocephalic, atraumatic, pupils are equal, round and reactive to light and accommodation. Corrective eyeglasses in place. Extraocular tracking is unremarkable. Hearing is grossly intact. Face is symmetric with normal facial animation, speech is without dysarthria. No lip, neck or jaw tremor, airway examination reveals moderate mouth dryness and moderate airway crowding. Tongue protrudes centrally and palate elevates symmetrically. No carotid bruits.   Chest: Clear to auscultation without wheezing, rhonchi or crackles noted.   Heart: S1+S2+0, regular and normal without murmurs, rubs or gallops noted.    Abdomen: Soft, non-tender and non-distended.   Extremities: There is no obvious change in the left distal leg being smaller in caliber (had polio as a child on the left side).  No pitting edema.   Skin: Warm and dry without trophic changes noted.   Neurologically:  Mental status: The patient is awake, alert and oriented in all 4 spheres. Her immediate and remote memory, attention, language skills and fund of knowledge are appropriate. There is no evidence of aphasia, agnosia, apraxia or anomia. Speech is clear with normal prosody and enunciation. Thought process is linear. Mood is normal and affect is normal.  Cranial nerves II - XII are as described above under HEENT exam. Motor exam: Normal bulk (except L distal leg), strength and tone is noted. There is no obvious resting or action tremor.  Romberg is not tested for safety concerns.   Fine motor skills and coordination: grossly intact.  Cerebellar testing: No dysmetria or intention tremor.  Sensory exam: intact to light touch.  Gait, station and balance: She stands with mild difficulty and pushes herself up, requires no assistance. She stands naturally slightly  wide-based. She walks without a limp but slowly, she has no walking aid today.   Assessment and Plan:  In summary, Karen Potter is an 83 year old right-handed woman with an underlying complex medical history of type 2 diabetes, right bundle branch block, history of polio, osteoarthritis, hypothyroidism, hypertension, hyperlipidemia, history of hepatitis B, history of TIA, reflux disease, seasonal allergies, peripheral edema, type 2 diabetes, OSA on CPAP, and morbid obesity with BMI of over 40, who presents for FU consultation of her OSA and Hx of TIA.  She had extensive workup in the hospital in March 2025.  We talked about secondary prevention and the possibility of having transient neurological symptoms even secondary to dehydration.  She is strongly encouraged to drink more water  and eliminate regular sodas altogether over time.  She is advised to use a cane for gait safety as she reports a fall about a month ago.  She is encouraged to even consider a 2 wheeled walker which she has available.  She is compliant with her CPAP, last 30 days were reviewed on her download, apnea scores are good.  She is on a set pressure of 12 cm with EPR of 1.  She is advised to follow-up routinely to see Colby Daub, NP in about a year from now.  I answered all their questions today and the patient and her husband were in agreement.   I spent 40 minutes in total face-to-face time and in reviewing records during pre-charting, more than 50% of which was spent in counseling and coordination of care, reviewing test results, reviewing medications and treatment regimen and/or in discussing or reviewing the diagnosis of OSA, transient neurological symptoms, the prognosis and treatment options. Pertinent laboratory and imaging test results that were available during this  visit with the patient were reviewed by me and considered in my medical decision making (see chart for details).

## 2023-10-12 ENCOUNTER — Other Ambulatory Visit: Payer: Self-pay | Admitting: Family Medicine

## 2023-10-12 NOTE — Telephone Encounter (Unsigned)
 Copied from CRM [id number]. Topic: Clinical - Medication Question >> Oct 12, 2023  3:12 PM [place] B wrote: Reason for CRM: pt needs refill of Farxiga  [place] her thru the [place], not sure how this works as far as using them for pharmacy to get this. Please cb pt with update

## 2023-10-12 NOTE — Telephone Encounter (Signed)
 Concha Deed, do you help with this. Is the pharmacy different from [facility]? That is where the Rx was sent last time.

## 2023-10-13 ENCOUNTER — Telehealth: Payer: Self-pay | Admitting: Pharmacist

## 2023-10-13 MED ORDER — DAPAGLIFLOZIN PROPANEDIOL 10 MG PO TABS: 10.0000 mg | ORAL_TABLET | Freq: Every day | ORAL | 3 refills | Status: AC

## 2023-10-13 NOTE — Telephone Encounter (Signed)
   Patient [place] in the [place] patient [place] program for Farxiga .  Updated RX escribed to [facility] mail order (pharmacy for [place] patient assistance).  Patient is stable on current regimen.  A1c controlled at 6.4% and PCP f/u in 11/2023.    Wilhemina Grall Dattero Tasnia Spegal, PharmD, BCACP, CPP Clinical Pharmacist, [facility]

## 2023-10-19 ENCOUNTER — Other Ambulatory Visit

## 2023-10-19 DIAGNOSIS — E039 Hypothyroidism, unspecified: Secondary | ICD-10-CM | POA: Diagnosis not present

## 2023-10-20 ENCOUNTER — Ambulatory Visit: Payer: Self-pay | Admitting: Family Medicine

## 2023-10-20 LAB — TSH: TSH: 3.32 u[IU]/mL (ref 0.450–4.500)

## 2023-11-10 ENCOUNTER — Ambulatory Visit: Admitting: Family Medicine

## 2023-11-10 ENCOUNTER — Encounter: Payer: Self-pay | Admitting: Family Medicine

## 2023-11-10 VITALS — BP 112/74 | HR 70 | Ht 64.0 in | Wt 232.0 lb

## 2023-11-10 DIAGNOSIS — I152 Hypertension secondary to endocrine disorders: Secondary | ICD-10-CM

## 2023-11-10 DIAGNOSIS — Z8673 Personal history of transient ischemic attack (TIA), and cerebral infarction without residual deficits: Secondary | ICD-10-CM

## 2023-11-10 DIAGNOSIS — E1159 Type 2 diabetes mellitus with other circulatory complications: Secondary | ICD-10-CM | POA: Diagnosis not present

## 2023-11-10 DIAGNOSIS — E039 Hypothyroidism, unspecified: Secondary | ICD-10-CM | POA: Diagnosis not present

## 2023-11-10 DIAGNOSIS — E1169 Type 2 diabetes mellitus with other specified complication: Secondary | ICD-10-CM

## 2023-11-10 DIAGNOSIS — M1A0710 Idiopathic chronic gout, right ankle and foot, without tophus (tophi): Secondary | ICD-10-CM

## 2023-11-10 DIAGNOSIS — E1140 Type 2 diabetes mellitus with diabetic neuropathy, unspecified: Secondary | ICD-10-CM | POA: Diagnosis not present

## 2023-11-10 DIAGNOSIS — M1712 Unilateral primary osteoarthritis, left knee: Secondary | ICD-10-CM

## 2023-11-10 DIAGNOSIS — K219 Gastro-esophageal reflux disease without esophagitis: Secondary | ICD-10-CM

## 2023-11-10 DIAGNOSIS — E785 Hyperlipidemia, unspecified: Secondary | ICD-10-CM | POA: Diagnosis not present

## 2023-11-10 LAB — LIPID PANEL

## 2023-11-10 LAB — BAYER DCA HB A1C WAIVED: HB A1C (BAYER DCA - WAIVED): 6.5 % — ABNORMAL HIGH (ref 4.8–5.6)

## 2023-11-10 MED ORDER — ALLOPURINOL 100 MG PO TABS: 100.0000 mg | ORAL_TABLET | Freq: Every day | ORAL | 3 refills | Status: AC

## 2023-11-10 MED ORDER — CLOPIDOGREL BISULFATE 75 MG PO TABS: 75.0000 mg | ORAL_TABLET | Freq: Every evening | ORAL | 3 refills | Status: AC

## 2023-11-10 MED ORDER — PANTOPRAZOLE SODIUM 40 MG PO TBEC: 40.0000 mg | DELAYED_RELEASE_TABLET | Freq: Every day | ORAL | 3 refills | Status: AC

## 2023-11-10 MED ORDER — CARVEDILOL 6.25 MG PO TABS: 6.2500 mg | ORAL_TABLET | Freq: Two times a day (BID) | ORAL | 3 refills | Status: AC

## 2023-11-10 MED ORDER — ONETOUCH VERIO VI STRP: ORAL_STRIP | 9 refills | Status: AC

## 2023-11-10 MED ORDER — LANCETS MISC. KIT: PACK | 12 refills | Status: AC

## 2023-11-10 NOTE — Progress Notes (Signed)
 BP 112/74   Pulse 70   Ht 5' 4 (1.626 m)   Wt 232 lb (105.2 kg)   SpO2 96%   BMI 39.82 kg/m    Subjective:   Patient ID: Karen Potter, female    DOB: Apr 28, 1941, 83 y.o.   MRN: [id number]  HPI: Karen Potter is a 83 y.o. female presenting on 11/10/2023 for Medical Management of Chronic Issues, Diabetes, Hypertension, and Knee Pain (left)   HPI Type 2 diabetes mellitus Patient comes in today for recheck of his diabetes. Patient has been currently taking Farxiga  and Januvia . Patient is currently on an ACE inhibitor/ARB. Patient has seen an ophthalmologist this year. Patient denies any new issues with their feet. The symptom started onset as an adult neuropathy and hyperlipidemia and hypothyroidism and hypertension ARE RELATED TO DM   Hyperlipidemia Patient is coming in for recheck of his hyperlipidemia. The patient is currently taking Crestor . They deny any issues with myalgias or history of liver damage from it. They deny any focal numbness or weakness or chest pain.   Hypothyroidism recheck Patient is coming in for thyroid  recheck today as well. They deny any issues with hair changes or heat or cold problems or diarrhea or constipation. They deny any chest pain or palpitations. They are currently on levothyroxine  100 micrograms   Hypertension Patient is currently on carvedilol  and furosemide  and losartan , and their blood pressure today is 112/74. Patient denies any lightheadedness or dizziness. Patient denies headaches, blurred vision, chest pains, shortness of breath, or weakness. Denies any side effects from medication and is content with current medication.   Left knee pain Patient's biggest complaint today is left knee pain.  She has been bothering her for the past few weeks at least but getting worse.  She is taking some Tylenol  but it just feels like it is not getting better.  She feels like she is unable to step up on stairs and that is when it hurts the  most.  GERD Patient is currently on pantoprazole .  She denies any major symptoms or abdominal pain or belching or burping. She denies any blood in her stool or lightheadedness or dizziness.   Relevant past medical, surgical, family and social history reviewed and updated as indicated. Interim medical history since our last visit reviewed. Allergies and medications reviewed and updated.  Review of Systems  Constitutional:  Negative for chills and fever.  HENT:  Negative for congestion, ear discharge and ear pain.   Eyes:  Negative for redness and visual disturbance.  Respiratory:  Negative for chest tightness and shortness of breath.   Cardiovascular:  Negative for chest pain and leg swelling.  Genitourinary:  Negative for difficulty urinating and dysuria.  Musculoskeletal:  Positive for arthralgias and myalgias. Negative for back pain and gait problem.  Skin:  Negative for rash.  Neurological:  Negative for dizziness, light-headedness and headaches.  Psychiatric/Behavioral:  Negative for agitation and behavioral problems.   All other systems reviewed and are negative.   Per HPI unless specifically indicated above   Allergies as of 11/10/2023       Reactions   Codeine Itching   Dilaudid  [hydromorphone ] Other (See Comments)   Mouth blisters   Other Itching   Most pain meds cause itching.  When has to take pain medication, she has been instructed to take Benadryl    Hydrocodone -acetaminophen  Other (See Comments)   Doxycycline Rash, Other (See Comments)        Medication List  Accurate as of November 10, 2023 11:42 AM. If you have any questions, ask your nurse or doctor.          acetaminophen  650 MG CR tablet Commonly known as: TYLENOL  Take 650 mg by mouth in the morning.   allopurinol  100 MG tablet Commonly known as: ZYLOPRIM  Take 1 tablet (100 mg total) by mouth daily.   Blood Glucose Monitor System w/Device Kit Use glucose meter to check blood sugar up to 3  times daily as instructed. One Touch Verio preferred by insurance. Dx: EE11.69   carvedilol  6.25 MG tablet Commonly known as: COREG  Take 1 tablet (6.25 mg total) by mouth 2 (two) times daily with a meal.   clobetasol  ointment 0.05 % Commonly known as: TEMOVATE  Apply 1 Application topically 2 (two) times daily.   clopidogrel  75 MG tablet Commonly known as: PLAVIX  Take 1 tablet (75 mg total) by mouth every evening.   clotrimazole -betamethasone  cream Commonly known as: LOTRISONE  Apply 1 Application topically daily.   dapagliflozin  propanediol 10 MG Tabs tablet Commonly known as: FARXIGA  Take 1 tablet (10 mg total) by mouth daily before breakfast.   dicyclomine  10 MG capsule Commonly known as: BENTYL  Take 20 mg by mouth 3 (three) times daily.   Elderberry 500 MG Caps Take 500 mg by mouth daily.   furosemide  40 MG tablet Commonly known as: LASIX  Take 1 tablet (40 mg total) by mouth daily.   L-FORMULA LYSINE HCL PO Take 1 Dose by mouth daily.   Lancets Misc. Kit Use patient & insurance preferred lancet device to test blood glucose up to 3 times daily for Dx: E11.69   levothyroxine  100 MCG tablet Commonly known as: SYNTHROID  Take 1 tablet (100 mcg total) by mouth daily.   losartan  25 MG tablet Commonly known as: COZAAR  Take 0.5 tablets (12.5 mg total) by mouth daily.   nystatin  powder Commonly known as: MYCOSTATIN /NYSTOP  APPLY  POWDER TOPICALLY TO AFFECTED AREA THREE TIMES DAILY   OneTouch Verio test strip Generic drug: glucose blood USE 1 STRIP TO CHECK GLUCOSE UP TO THREE TIMES DAILY AS  INSTRUCTED   pantoprazole  40 MG tablet Commonly known as: PROTONIX  Take 1 tablet (40 mg total) by mouth daily.   rosuvastatin  5 MG tablet Commonly known as: CRESTOR  Take 1 tablet (5 mg total) by mouth daily.   sitaGLIPtin  100 MG tablet Commonly known as: Januvia  Take 1 tablet (100 mg total) by mouth daily.   Vitamin D3 50 MCG (2000 UT) capsule Take 2,000 Units by mouth 2  (two) times daily.         Objective:   BP 112/74   Pulse 70   Ht 5' 4 (1.626 m)   Wt 232 lb (105.2 kg)   SpO2 96%   BMI 39.82 kg/m   Wt Readings from Last 3 Encounters:  11/10/23 232 lb (105.2 kg)  09/27/23 233 lb (105.7 kg)  08/04/23 226 lb (102.5 kg)    Physical Exam Vitals and nursing note reviewed.  Constitutional:      General: She is not in acute distress.    Appearance: She is well-developed. She is not diaphoretic.  Eyes:     Conjunctiva/sclera: Conjunctivae normal.     Pupils: Pupils are equal, round, and reactive to light.  Cardiovascular:     Rate and Rhythm: Normal rate and regular rhythm.     Heart sounds: Normal heart sounds. No murmur heard. Pulmonary:     Effort: Pulmonary effort is normal. No respiratory distress.  Breath sounds: Normal breath sounds. No wheezing.  Musculoskeletal:        General: Normal range of motion.     Left knee: Tenderness present over the medial joint line.  Skin:    General: Skin is warm and dry.     Findings: No rash.  Neurological:     Mental Status: She is alert and oriented to person, place, and time.     Coordination: Coordination normal.  Psychiatric:        Behavior: Behavior normal.       Assessment & Plan:   Problem List Items Addressed This Visit       Cardiovascular and Mediastinum   Hypertension associated with diabetes (HCC)   Relevant Medications   carvedilol  (COREG ) 6.25 MG tablet   Other Relevant Orders   CBC with Differential/Platelet   CMP14+EGFR   Lipid panel   Bayer DCA Hb A1c Waived     Digestive   GERD (gastroesophageal reflux disease) (Chronic)   Relevant Medications   pantoprazole  (PROTONIX ) 40 MG tablet     Endocrine   Hyperlipidemia associated with type 2 diabetes mellitus (HCC) - Primary   Relevant Medications   carvedilol  (COREG ) 6.25 MG tablet   Other Relevant Orders   CBC with Differential/Platelet   CMP14+EGFR   Lipid panel   Bayer DCA Hb A1c Waived    Hypothyroid   Relevant Medications   carvedilol  (COREG ) 6.25 MG tablet   Other Relevant Orders   TSH   Type 2 diabetes mellitus with other specified complication (HCC)   Relevant Medications   carvedilol  (COREG ) 6.25 MG tablet   glucose blood (ONETOUCH VERIO) test strip   Type 2 diabetes mellitus with diabetic neuropathy, without long-term current use of insulin  (HCC)   Other Visit Diagnoses       Chronic gout of right foot, unspecified cause       Relevant Medications   allopurinol  (ZYLOPRIM ) 100 MG tablet     History of TIA (transient ischemic attack)       Relevant Medications   clopidogrel  (PLAVIX ) 75 MG tablet     Primary osteoarthritis of left knee       Relevant Medications   allopurinol  (ZYLOPRIM ) 100 MG tablet       A1c was 6.5, blood pressure looks good today as well.  Patient's left knee osteoarthritis, recommended that she can increase the Tylenol  and also use Voltaren  gel.  If that does not work she can come back and try an injection in the future. Follow up plan: Return in about 3 months (around 02/10/2024), or if symptoms worsen or fail to improve, for Diabetes and thyroid .  Counseling provided for all of the vaccine components Orders Placed This Encounter  Procedures   CBC with Differential/Platelet   CMP14+EGFR   Lipid panel   Bayer DCA Hb A1c Waived   TSH    Fonda Levins, MD [facility] 11/10/2023, 11:42 AM

## 2023-11-11 LAB — CMP14+EGFR
ALT: 16 IU/L (ref 0–32)
AST: 17 IU/L (ref 0–40)
Albumin: 4.1 g/dL (ref 3.7–4.7)
Alkaline Phosphatase: 104 IU/L (ref 44–121)
BUN/Creatinine Ratio: 17 (ref 12–28)
BUN: 17 mg/dL (ref 8–27)
Bilirubin Total: 0.6 mg/dL (ref 0.0–1.2)
CO2: 24 mmol/L (ref 20–29)
Calcium: 9.6 mg/dL (ref 8.7–10.3)
Chloride: 101 mmol/L (ref 96–106)
Creatinine, Ser: 1.01 mg/dL — ABNORMAL HIGH (ref 0.57–1.00)
Globulin, Total: 2.9 g/dL (ref 1.5–4.5)
Glucose: 130 mg/dL — ABNORMAL HIGH (ref 70–99)
Potassium: 3.9 mmol/L (ref 3.5–5.2)
Sodium: 142 mmol/L (ref 134–144)
Total Protein: 7 g/dL (ref 6.0–8.5)
eGFR: 56 mL/min/1.73 — ABNORMAL LOW (ref >59)

## 2023-11-11 LAB — CBC WITH DIFFERENTIAL/PLATELET
Basophils Absolute: 0.1 x10E3/uL (ref 0.0–0.2)
Basos: 1 %
EOS (ABSOLUTE): 0.1 x10E3/uL (ref 0.0–0.4)
Eos: 2 %
Hematocrit: 45.4 % (ref 34.0–46.6)
Hemoglobin: 14.8 g/dL (ref 11.1–15.9)
Immature Grans (Abs): 0 x10E3/uL (ref 0.0–0.1)
Immature Granulocytes: 0 %
Lymphocytes Absolute: 2.1 x10E3/uL (ref 0.7–3.1)
Lymphs: 25 %
MCH: 30.3 pg (ref 26.6–33.0)
MCHC: 32.6 g/dL (ref 31.5–35.7)
MCV: 93 fL (ref 79–97)
Monocytes Absolute: 1 x10E3/uL — ABNORMAL HIGH (ref 0.1–0.9)
Monocytes: 11 %
Neutrophils Absolute: 5.3 x10E3/uL (ref 1.4–7.0)
Neutrophils: 61 %
Platelets: 256 x10E3/uL (ref 150–450)
RBC: 4.88 x10E6/uL (ref 3.77–5.28)
RDW: 13.1 % (ref 11.7–15.4)
WBC: 8.6 x10E3/uL (ref 3.4–10.8)

## 2023-11-11 LAB — LIPID PANEL
Chol/HDL Ratio: 2.8 ratio (ref 0.0–4.4)
Cholesterol, Total: 110 mg/dL (ref 100–199)
HDL: 39 mg/dL — ABNORMAL LOW (ref >39)
LDL Chol Calc (NIH): 46 mg/dL (ref 0–99)
Triglycerides: 147 mg/dL (ref 0–149)
VLDL Cholesterol Cal: 25 mg/dL (ref 5–40)

## 2023-11-14 ENCOUNTER — Ambulatory Visit: Payer: Self-pay | Admitting: Family Medicine

## 2023-11-14 ENCOUNTER — Telehealth: Payer: Self-pay

## 2023-11-14 NOTE — Telephone Encounter (Signed)
 Copied from CRM [id number]. Topic: Clinical - Medical Advice >> Nov 14, 2023  8:29 AM Diannia H wrote: Reason for CRM: Patient called in today about her left knee, provider seen patient on 07/03 and gave her a few things for it but its not getting any better only worse, provider mentioned something about injections and the patient is wanting to speak to someone about that. Could you please assist, patients callback number is [phone].

## 2023-11-14 NOTE — Telephone Encounter (Signed)
 Appt made with Dr. KANDICE 7/9 at 8:50am. Pt aware. She did not want to wait until Thursday for Dettinger since she is in so much pain.

## 2023-11-14 NOTE — Telephone Encounter (Signed)
 I was talking about an injection into the knee for arthritis, if she does feel like she wants to do that now then please make her an appointment to do that.

## 2023-11-15 ENCOUNTER — Emergency Department (HOSPITAL_BASED_OUTPATIENT_CLINIC_OR_DEPARTMENT_OTHER): Admitting: Radiology

## 2023-11-15 ENCOUNTER — Other Ambulatory Visit: Payer: Self-pay

## 2023-11-15 ENCOUNTER — Other Ambulatory Visit (HOSPITAL_BASED_OUTPATIENT_CLINIC_OR_DEPARTMENT_OTHER): Payer: Self-pay

## 2023-11-15 ENCOUNTER — Encounter (HOSPITAL_BASED_OUTPATIENT_CLINIC_OR_DEPARTMENT_OTHER): Payer: Self-pay

## 2023-11-15 ENCOUNTER — Emergency Department (HOSPITAL_BASED_OUTPATIENT_CLINIC_OR_DEPARTMENT_OTHER)
Admission: EM | Admit: 2023-11-15 | Discharge: 2023-11-15 | Disposition: A | Discharge status: Home / Self Care | Attending: Emergency Medicine | Admitting: Emergency Medicine

## 2023-11-15 DIAGNOSIS — M25562 Pain in left knee: Secondary | ICD-10-CM | POA: Diagnosis not present

## 2023-11-15 DIAGNOSIS — E039 Hypothyroidism, unspecified: Secondary | ICD-10-CM | POA: Insufficient documentation

## 2023-11-15 DIAGNOSIS — M1712 Unilateral primary osteoarthritis, left knee: Secondary | ICD-10-CM | POA: Insufficient documentation

## 2023-11-15 DIAGNOSIS — Z7901 Long term (current) use of anticoagulants: Secondary | ICD-10-CM | POA: Insufficient documentation

## 2023-11-15 DIAGNOSIS — M25462 Effusion, left knee: Secondary | ICD-10-CM | POA: Insufficient documentation

## 2023-11-15 DIAGNOSIS — I10 Essential (primary) hypertension: Secondary | ICD-10-CM | POA: Diagnosis not present

## 2023-11-15 DIAGNOSIS — Z79899 Other long term (current) drug therapy: Secondary | ICD-10-CM | POA: Insufficient documentation

## 2023-11-15 DIAGNOSIS — E119 Type 2 diabetes mellitus without complications: Secondary | ICD-10-CM | POA: Diagnosis not present

## 2023-11-15 LAB — SPECIMEN STATUS REPORT

## 2023-11-15 LAB — TSH: TSH: 1.21 u[IU]/mL (ref 0.450–4.500)

## 2023-11-15 MED ORDER — LIDOCAINE-EPINEPHRINE (PF) 2 %-1:200000 IJ SOLN
10.0000 mL | Freq: Once | INTRAMUSCULAR | Status: AC
  Administered 2023-11-15: 10 mL
  Filled 2023-11-15: qty 20

## 2023-11-15 MED ORDER — PREDNISONE 20 MG PO TABS
40.0000 mg | ORAL_TABLET | Freq: Every day | ORAL | 0 refills | Status: DC
  Filled 2023-11-15: qty 10, 5d supply, fill #0

## 2023-11-15 MED ORDER — BUPIVACAINE-EPINEPHRINE (PF) 0.5% -1:200000 IJ SOLN: 10.0000 mL | Freq: Once | INTRAMUSCULAR | Status: DC

## 2023-11-15 NOTE — Discharge Instructions (Signed)
 We were able to pull out 30 mL of fluid.  The fluid is most likely from the arthritis being flared up.  Your x-ray did not show any signs of anything broken.  There was no blood in your joint.  The lidocaine  we put in can last anywhere from 3 to 5 hours.  You can start the prednisone  to see if that helps with the inflammation and pain.  It is okay to continue using the Voltaren  gel and Tylenol .

## 2023-11-15 NOTE — ED Notes (Signed)
 Pt d/c instructions, medications, and follow-up care reviewed with pt. Pt verbalized understanding and had no further questions at time of d/c. Pt CA&Ox4 and in NAD at time of d/c

## 2023-11-15 NOTE — ED Triage Notes (Signed)
 Onset a week of increasing pain to left knee.  Unable to walk on.  Denies injury.  Present in wheel chair

## 2023-11-15 NOTE — ED Provider Notes (Signed)
 [place] EMERGENCY DEPARTMENT AT [facility] Provider Note   CSN: [id number] Arrival date & time: 11/15/23  1011     Patient presents with: Knee Pain   Karen Potter is a 83 y.o. female.   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  It started out with mild aching pain in her knee which has just progressively gotten worse.  She has noticed some swelling in the knee as well it hurts in the medial portion of the knee.  There has been no redness or fever.  She denies any injury that she is aware of.  She reports the pain is so bad that she cannot walk and she is having to sit in a wheelchair and is unable to leave her house.  She has multiple medication allergies and can only take Tylenol  and use Voltaren  gel which she reports has not touched the pain.  The history is provided by the patient and medical records.  Knee Pain      Prior to Admission medications   Medication Sig Start Date End Date Taking? Authorizing Provider  predniSONE  (DELTASONE ) 20 MG tablet Take 2 tablets (40 mg total) by mouth daily. 11/15/23  Yes Doretha Folks, MD  acetaminophen  (TYLENOL ) 650 MG CR tablet Take 650 mg by mouth in the morning.    [provider]  allopurinol  (ZYLOPRIM ) 100 MG tablet Take 1 tablet (100 mg total) by mouth daily. 11/10/23   Dettinger, Fonda LABOR, MD  Blood Glucose Monitoring Suppl (BLOOD GLUCOSE MONITOR SYSTEM) w/Device KIT Use glucose meter to check blood sugar up to 3 times daily as instructed. One Touch Verio preferred by insurance. Dx: EE11.69 05/12/18   Dettinger, Fonda LABOR, MD  carvedilol  (COREG ) 6.25 MG tablet Take 1 tablet (6.25 mg total) by mouth 2 (two) times daily with a  meal. 11/10/23   Dettinger, Fonda LABOR, MD  Cholecalciferol (VITAMIN D3) 2000 UNITS capsule Take 2,000 Units by mouth 2 (two) times daily.     [provider]  clobetasol  ointment (TEMOVATE ) 0.05 % Apply 1 Application topically 2 (two) times daily. 12/06/22   Joesph Annabella HERO, FNP  clopidogrel  (PLAVIX ) 75 MG tablet Take 1 tablet (75 mg total) by mouth every evening. 11/10/23   Dettinger, Fonda LABOR, MD  clotrimazole -betamethasone  (LOTRISONE ) cream Apply 1 Application topically daily. 12/30/21   Dettinger, Fonda LABOR, MD  dapagliflozin  propanediol (FARXIGA ) 10 MG TABS tablet Take 1 tablet (10 mg total) by mouth daily before breakfast. 10/13/23   Dettinger, Fonda LABOR, MD  dicyclomine  (BENTYL ) 10 MG capsule Take 20 mg by mouth 3 (three) times daily. 07/19/23   [provider]  Elderberry 500 MG CAPS Take 500 mg by mouth daily.    [provider]  furosemide  (LASIX ) 40 MG tablet Take 1 tablet (40 mg total) by mouth daily. 08/04/23   Dettinger, Fonda LABOR, MD  glucose blood (ONETOUCH VERIO) test strip USE 1 STRIP TO CHECK GLUCOSE UP TO THREE TIMES DAILY AS  INSTRUCTED 11/10/23   Dettinger, Fonda LABOR, MD  L-FORMULA LYSINE HCL PO Take 1 Dose by mouth daily.    [provider]  Lancets Misc. KIT Use patient & insurance preferred lancet device to test blood glucose up to  3 times daily for Dx: E11.69 11/10/23   Dettinger, Fonda LABOR, MD  levothyroxine  (SYNTHROID ) 100 MCG tablet Take 1 tablet (100 mcg total) by mouth daily. 08/08/23   Dettinger, Fonda LABOR, MD  losartan  (COZAAR ) 25 MG tablet Take 0.5 tablets (12.5 mg total) by mouth daily. 08/04/23   Dettinger, Fonda LABOR, MD  nystatin  (MYCOSTATIN /NYSTOP ) powder APPLY  POWDER TOPICALLY TO AFFECTED AREA THREE TIMES DAILY 06/15/23   Dettinger, Fonda LABOR, MD  pantoprazole  (PROTONIX ) 40 MG tablet Take 1 tablet (40 mg total) by mouth daily. 11/10/23   Dettinger, Fonda LABOR, MD  rosuvastatin  (CRESTOR ) 5 MG tablet Take 1 tablet (5 mg total) by mouth daily. 08/04/23    Dettinger, Fonda LABOR, MD  sitaGLIPtin  (JANUVIA ) 100 MG tablet Take 1 tablet (100 mg total) by mouth daily. 08/04/23   Dettinger, Fonda LABOR, MD    Allergies: Codeine, Dilaudid  [hydromorphone ], Other, Hydrocodone -acetaminophen , and Doxycycline    Review of Systems  Updated Vital Signs BP 125/73   Pulse 71   Temp 98.1 F (36.7 C)   Resp 20   Ht 5' 4 (1.626 m)   Wt 102.1 kg   SpO2 97%   BMI 38.62 kg/m   Physical Exam Vitals reviewed.  Cardiovascular:     Rate and Rhythm: Normal rate.     Pulses: Normal pulses.  Pulmonary:     Effort: Pulmonary effort is normal.  Musculoskeletal:        General: Tenderness present.     Left knee: No erythema, ecchymosis or bony tenderness. Tenderness present over the medial joint line and patellar tendon. No lateral joint line tenderness.     Comments: No noted swelling in the lower leg.  Left leg is slightly shortened compared to the right.  Minimal swelling at the knee.  No warmth or erythema noted  Neurological:     Mental Status: She is alert. Mental status is at baseline.     (all labs ordered are listed, but only abnormal results are displayed) Labs Reviewed - No data to display  EKG: None  Radiology: DG Knee Complete 4 Views Left Result Date: 11/15/2023 CLINICAL DATA:  pain and swelling EXAM: LEFT KNEE - COMPLETE 4+ VIEW COMPARISON:  December 21, 2021 FINDINGS: No acute fracture or dislocation. Small joint effusion. Meniscal chondrocalcinosis. Mild-to-moderate patellofemoral joint space loss with osteophyte formation. Peripheral vascular atherosclerosis. IMPRESSION: 1. Small joint effusion.  No acute fracture or dislocation. 2. Mild-to-moderate osteoarthritis of the knee at the patellofemoral joint. Electronically Signed   By: Rogelia Myers M.D.   On: 11/15/2023 11:22     .Joint Aspiration/Arthrocentesis  Date/Time: 11/15/2023 3:32 PM  Performed by: Doretha Folks, MD Authorized by: Doretha Folks, MD   Consent:    Consent  obtained:  Verbal   Consent given by:  Patient   Risks, benefits, and alternatives were discussed: yes     Risks discussed:  Bleeding, infection and pain   Alternatives discussed:  No treatment Universal protocol:    Relevant documents present and verified: yes     Site/side marked: yes     Immediately prior to procedure, a time out was called: yes     Patient identity confirmed:  Verbally with patient Location:    Location:  Knee   Knee:  L knee Anesthesia:    Anesthesia method:  Local infiltration   Local anesthetic:  Lidocaine  2% WITH epi Procedure details:    Preparation: Patient was prepped and draped in usual sterile fashion     Needle  gauge:  18 G   Ultrasound guidance: yes     Approach:  Lateral   Aspirate amount:  30mL   Aspirate characteristics:  Clear and yellow   Steroid injected: no     Specimen collected: no   Post-procedure details:    Dressing:  Adhesive bandage   Procedure completion:  Tolerated    Medications Ordered in the ED  lidocaine -EPINEPHrine  (XYLOCAINE  W/EPI) 2 %-1:200000 (PF) injection 10 mL (10 mLs Infiltration Given 11/15/23 1213)                                    Medical Decision Making Amount and/or Complexity of Data Reviewed Radiology: ordered and independent interpretation performed. Decision-making details documented in ED Course.  Risk Prescription drug management.   Patient presenting with persistent left knee pain that worsened over the last week.  No findings on exam concerning for septic arthritis and patient denies fever, redness of the joint.  No recent procedures done on the knee.  Patient reports significant allergy to almost all pain medications so she has been using Tylenol  and Voltaren  gel without improvement. I have independently visualized and interpreted pt's images today.  Knee film with mild arthritis but no evidence of fracture or dislocation.  Radiology reports small joint effusion and mild to moderate osteoarthritis.   Suspect that is the cause of the patient's pain.  Low suspicion for muscular injury or DVT.  Due to patient's multiple medication allergies and having superior pain offered her a bupivacaine  injection after discussing the risk with her and she would like to proceed with that.  She will need to follow-up with Dr. Beckie who is her orthopedist.  30 mL of clear yellow fluid were removed from her knee and lidocaine  with epi was placed for pain control.  Patient was put in an Ace wrap and ice with follow-up with orthopedics.      Final diagnoses:  Effusion of left knee joint  Primary osteoarthritis of left knee    ED Discharge Orders          Ordered    predniSONE  (DELTASONE ) 20 MG tablet  Daily        11/15/23 1238               Doretha Folks, MD 11/15/23 1533

## 2023-11-16 ENCOUNTER — Ambulatory Visit

## 2023-11-17 ENCOUNTER — Ambulatory Visit: Admitting: Neurology

## 2023-11-21 DIAGNOSIS — M25562 Pain in left knee: Secondary | ICD-10-CM | POA: Diagnosis not present

## 2023-11-26 DIAGNOSIS — M25562 Pain in left knee: Secondary | ICD-10-CM | POA: Diagnosis not present

## 2023-12-01 DIAGNOSIS — M25562 Pain in left knee: Secondary | ICD-10-CM | POA: Diagnosis not present

## 2023-12-09 ENCOUNTER — Ambulatory Visit: Payer: PPO

## 2023-12-09 VITALS — Ht 64.0 in | Wt 223.0 lb

## 2023-12-09 DIAGNOSIS — Z0000 Encounter for general adult medical examination without abnormal findings: Secondary | ICD-10-CM

## 2023-12-09 DIAGNOSIS — Z1231 Encounter for screening mammogram for malignant neoplasm of breast: Secondary | ICD-10-CM | POA: Diagnosis not present

## 2023-12-09 NOTE — Progress Notes (Signed)
 Subjective:   Karen Potter is a 83 y.o. who presents for a Medicare Wellness preventive visit.  As a reminder, Annual Wellness Visits don't include a physical exam, and some assessments may be limited, especially if this visit is performed virtually. We may recommend an in-person follow-up visit with your provider if needed.  Visit Complete: Virtual I connected with  Karen Potter on 12/09/23 by a audio enabled telemedicine application and verified that I am speaking with the correct person using two identifiers.  Patient Location: Home  Provider Location: Home Office  I discussed the limitations of evaluation and management by telemedicine. The patient expressed understanding and agreed to proceed.  Vital Signs: Because this visit was a virtual/telehealth visit, some criteria may be missing or patient reported. Any vitals not documented were not able to be obtained and vitals that have been documented are patient reported.  VideoDeclined- This patient declined Librarian, academic. Therefore the visit was completed with audio only.  Persons Participating in Visit: Patient.  AWV Questionnaire: No: Patient Medicare AWV questionnaire was not completed prior to this visit.  Cardiac Risk Factors include: advanced age (>79men, >73 women);diabetes mellitus;dyslipidemia;hypertension;obesity (BMI >30kg/m2);sedentary lifestyle;Other (see comment), Risk factor comments: Sleep apnea on cpap, CAD     Objective:    Today's Vitals   12/09/23 0754  Weight: 223 lb (101.2 kg)  Height: 5' 4 (1.626 m)  PainSc: 3   PainLoc: Knee   Body mass index is 38.28 kg/m.     12/09/2023    7:49 AM 07/27/2023    6:07 PM 07/27/2023    1:42 PM 12/08/2022    1:21 PM 06/23/2022    2:35 PM 12/04/2021    1:48 PM 06/14/2021    7:52 PM  Advanced Directives  Does Patient Have a Medical Advance Directive? No Yes No Yes Yes Yes Yes  Type of Advance Directive  Living will  Healthcare  Power of [place];Living will Healthcare Power of [place];Living will Healthcare Power of [place];Living will Living will  Does patient want to make changes to medical advance directive?  No - Patient declined  No - Patient declined No - Patient declined  No - Patient declined  Copy of Healthcare Power of Attorney in Chart?    Yes - validated most recent copy scanned in chart (See row information) No - copy requested Yes - validated most recent copy scanned in chart (See row information)   Would patient like information on creating a medical advance directive? Yes (MAU/Ambulatory/Procedural Areas - Information given) No - Patient declined No - Patient declined    No - Patient declined    Current Medications (verified) Outpatient Encounter Medications as of 12/09/2023  Medication Sig   acetaminophen  (TYLENOL ) 650 MG CR tablet Take 650 mg by mouth in the morning.   allopurinol  (ZYLOPRIM ) 100 MG tablet Take 1 tablet (100 mg total) by mouth daily.   Blood Glucose Monitoring Suppl (BLOOD GLUCOSE MONITOR SYSTEM) w/Device KIT Use glucose meter to check blood sugar up to 3 times daily as instructed. One Touch Verio preferred by insurance. Dx: EE11.69   carvedilol  (COREG ) 6.25 MG tablet Take 1 tablet (6.25 mg total) by mouth 2 (two) times daily with a meal.   Cholecalciferol (VITAMIN D3) 2000 UNITS capsule Take 2,000 Units by mouth 2 (two) times daily.    clobetasol  ointment (TEMOVATE ) 0.05 % Apply 1 Application topically 2 (two) times daily.   clopidogrel  (PLAVIX ) 75 MG tablet Take 1 tablet (75 mg total)  by mouth every evening.   clotrimazole -betamethasone  (LOTRISONE ) cream Apply 1 Application topically daily.   dapagliflozin  propanediol (FARXIGA ) 10 MG TABS tablet Take 1 tablet (10 mg total) by mouth daily before breakfast.   Elderberry 500 MG CAPS Take 500 mg by mouth daily.   furosemide  (LASIX ) 40 MG tablet Take 1 tablet (40 mg total) by mouth daily.   glucose blood (ONETOUCH VERIO) test strip USE 1  STRIP TO CHECK GLUCOSE UP TO THREE TIMES DAILY AS  INSTRUCTED   L-FORMULA LYSINE HCL PO Take 1 Dose by mouth daily.   Lancets Misc. KIT Use patient & insurance preferred lancet device to test blood glucose up to 3 times daily for Dx: E11.69   levothyroxine  (SYNTHROID ) 100 MCG tablet Take 1 tablet (100 mcg total) by mouth daily.   losartan  (COZAAR ) 25 MG tablet Take 0.5 tablets (12.5 mg total) by mouth daily.   nystatin  (MYCOSTATIN /NYSTOP ) powder APPLY  POWDER TOPICALLY TO AFFECTED AREA THREE TIMES DAILY   pantoprazole  (PROTONIX ) 40 MG tablet Take 1 tablet (40 mg total) by mouth daily.   predniSONE  (DELTASONE ) 20 MG tablet Take 2 tablets (40 mg total) by mouth daily.   rosuvastatin  (CRESTOR ) 5 MG tablet Take 1 tablet (5 mg total) by mouth daily.   sitaGLIPtin  (JANUVIA ) 100 MG tablet Take 1 tablet (100 mg total) by mouth daily.   [DISCONTINUED] dicyclomine  (BENTYL ) 10 MG capsule Take 20 mg by mouth 3 (three) times daily.   No facility-administered encounter medications on file as of 12/09/2023.    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   age 25--  pt states was quarantined   no treatment ;  per pt no symptoms or issues since   History of kidney stones    History of TIA (transient ischemic attack)    03-23-2014  w/ episode temporay amnesia   Hyperlipidemia    Hypertension    Hypothyroidism    Iron deficiency anemia    LAFB (left anterior fascicular block)    OA  (osteoarthritis)    OSA on CPAP    severe per study 2003   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   Post-polio limb muscle weakness    left leg  w/ 3 repair surgery's   RBBB (right bundle branch block)    Seasonal allergies    TIA (transient ischemic attack)    2023   Type 2 diabetes mellitus (HCC)    Vitamin D  deficiency    Past Surgical History:  Procedure Laterality Date   BACK SURGERY     CARPAL TUNNEL RELEASE Right 05/10/2007   CHOLECYSTECTOMY OPEN  05/11/1983   and Appendectomy   CYSTO/  RIGHT URETEROSCOPIC STONE EXTRACTON/  STENT PLACEMENT  06/ 2016   in [place]    CYSTOSCOPY/RETROGRADE/URETEROSCOPY/STONE EXTRACTION WITH BASKET Right 11/17/2015   Procedure: CYSTOSCOPY/RETROGRADE PYELOGRAM RIGHT/DIAGNOSTIC RIGHT URETEROSCOPY/LASER RENAL CALCIFICATION/RIGHT STENT PLACEMENT;  Surgeon: Belvie CROME  McKenzie, MD;  Location: [facility];  Service: Urology;  Laterality: Right;   ESOPHAGOGASTRODUODENOSCOPY N/A 08/29/2015   Procedure: ESOPHAGOGASTRODUODENOSCOPY (EGD);  Surgeon: Margo LITTIE Haddock, MD;  [facility];  Service: Endoscopy;  Laterality: N/A;   HOLMIUM LASER APPLICATION Right 11/17/2015   Procedure: HOLMIUM LASER APPLICATION;  Surgeon: Belvie LITTIE Clara, MD;  [facility];  Service: Urology;  Laterality: Right;   KNEE ARTHROSCOPY Right 05/10/1997   LEFT HEEL REPAIR SURGERY  1954;  1985;  1995   polio   LUMBAR SPINE SURGERY  09/01/2010   L4 -L5 fusion,  L3 laminectomy,  L5 - S1 foraminotomy   OVARIAN CYST SURGERY Right 05/10/1964   PARATHYROIDECTOMY N/A 05/06/2015   Procedure: PARATHYROIDECTOMY;  Surgeon: Krystal Spinner, MD;  Location: [facility];  Service: General;  Laterality: N/A;   left superior   TONSILLECTOMY  as child   TOTAL ABDOMINAL HYSTERECTOMY W/ BILATERAL SALPINGOOPHORECTOMY  05/10/1982   TOTAL KNEE ARTHROPLASTY Right 04/08/2008   TRANSTHORACIC ECHOCARDIOGRAM  04/07/2012   grade 1 diastolic function,   ef 60-65%/  mild AV calcification without stenosis/  trivial MR   Family History  Problem Relation Age of Onset   Heart failure Mother        No details.  No MI   Cancer Father        Lung   Cancer Brother        Lung   Stroke Maternal Grandfather    Cancer Paternal Grandmother        rectal   Cancer Paternal Grandfather        gastric   Sleep apnea Neg Hx    Transient ischemic attack Neg Hx    Social History   Socioeconomic History   Marital status: Married    Spouse name: Mickey   Number of children: 4   Years of education: 13-some college   Highest education level: High school graduate  Occupational History   Occupation: Retired  Tobacco Use   Smoking status: Never   Smokeless tobacco: Never  Vaping Use   Vaping status: Never Used  Substance and Sexual Activity   Alcohol use: No   Drug use: No   Sexual activity: Not Currently    Birth control/protection: Surgical  Other Topics Concern   Not on file  Social History Narrative   Lives with husband.  Four children.     Drinks 1-2 cokes per day at least, but usually drinks water    All children live fairly close. Son is closest, but works 12-13 hours/day.   Disabled daughter in [place] that she helps care for.   Another daughter lives within 15 minutes and takes her shopping and to out of town doctor visits.   Social Drivers of Corporate investment banker Strain: Low Risk  (12/09/2023)   Overall Financial Resource Strain (CARDIA)    Difficulty of Paying Living Expenses: Not hard at all  Food Insecurity: No Food Insecurity (12/09/2023)   Hunger Vital Sign    Worried About Running Out of Food in the Last Year: Never true    Ran Out of Food in the Last Year: Never true  Transportation Needs: No Transportation Needs (12/09/2023)   PRAPARE - Administrator, Civil Service (Medical): No    Lack of Transportation (Non-Medical): No  Physical Activity: Inactive (12/09/2023)   Exercise Vital Sign    Days of Exercise  per Week: 0 days    Minutes of Exercise per  Session: 0 min  Stress: Stress Concern Present (12/09/2023)   [employer] of Occupational Health - Occupational Stress Questionnaire    Feeling of Stress: To some extent  Social Connections: Socially Integrated (12/09/2023)   Social Connection and Isolation Panel    Frequency of Communication with Friends and Family: More than three times a week    Frequency of Social Gatherings with Friends and Family: More than three times a week    Attends Religious Services: More than 4 times per year    Active Member of [employer] or Organizations: Yes    Attends Engineer, structural: More than 4 times per year    Marital Status: Married    Tobacco Counseling Counseling given: Yes    Clinical Intake:  Pre-visit preparation completed: Yes  Pain : No/denies pain Pain Score: 3      BMI - recorded: 38.28 Nutritional Status: BMI > 30  Obese Diabetes: Yes CBG done?: No (telehealth visit. unable to obtain.) Did pt. bring in CBG monitor from home?: No  Lab Results  Component Value Date   HGBA1C 6.5 (H) 11/10/2023   HGBA1C 6.5 (H) 07/27/2023   HGBA1C 6.4 (H) 04/18/2023     How often do you need to have someone help you when you read instructions, pamphlets, or other written materials from your doctor or pharmacy?: 1 - Never  Interpreter Needed?: No  Information entered by :: [facility]   Activities of Daily Living     12/09/2023    8:04 AM 07/27/2023    6:07 PM  In your present state of health, do you have any difficulty performing the following activities:  Hearing? 1 0  Comment unable to hear out of LT ear, hearing aid in RT ear   Vision? 0 0  Difficulty concentrating or making decisions? 0 0  Walking or climbing stairs? 1   Comment torn meniscus   Dressing or bathing? 0   Doing errands, shopping? 1 0  Comment torn meniscus   Preparing Food and eating ? N   Using the Toilet? N   In the past six months, have you  accidently leaked urine? N   Do you have problems with loss of bowel control? N   Managing your Medications? N   Managing your Finances? N   Housekeeping or managing your Housekeeping? Y     Patient Care Team: Dettinger, Fonda LABOR, MD as PCP - General (Family Medicine) Rosalie Kitchens, MD as Consulting Physician (Gastroenterology) Alphonzo Michelene Berg, MD as Referring Physician (Nurse Practitioner) Buck Saucer, MD as Attending Physician (Neurology) Lavona Agent, MD as Consulting Physician (Cardiology) Sherrilee Belvie CROME, MD as Consulting Physician (Urology) Melodi Lerner, MD as Consulting Physician (Orthopedic Surgery) Cleotilde Sewer, OD (Optometry)  I have updated your Care Teams any recent Medical Services you may have received from other providers in the past year.     Assessment:   This is a routine wellness examination for [place].  Hearing/Vision screen Hearing Screening - Comments:: Patient is unable to hear out of left ear, but wears a hearing aid in right ear. She is up to date on exams and sees [facility] in [place]  Vision Screening - Comments:: Wears rx glasses - up to date with routine eye exams with  [facility] in [place]   Goals Addressed             This Visit's Progress    Patient Stated       I'd like to improve  my health       Depression Screen     12/09/2023    8:06 AM 11/10/2023   11:28 AM 08/04/2023   10:16 AM 07/29/2023    1:41 PM 04/18/2023   11:13 AM 01/06/2023   10:20 AM 12/08/2022    1:20 PM  PHQ 2/9 Scores  PHQ - 2 Score 3 0 0 0 0 0 0  PHQ- 9 Score 6   0 1 2 0    Fall Risk     12/09/2023    8:01 AM 11/10/2023   11:28 AM 08/04/2023   10:16 AM 04/18/2023   11:13 AM 01/06/2023   10:20 AM  Fall Risk   Falls in the past year? 0 0 0 0 1  Number falls in past yr: 0 0 0  0  Injury with Fall? 0 0 0  1  Risk for fall due to : Impaired balance/gait;Impaired mobility;Orthopedic patient No Fall Risks Impaired balance/gait  Impaired  balance/gait;Orthopedic patient  Risk for fall due to: Comment patient currently has a torn meniscus LEFT      Follow up Falls prevention discussed;Education provided;Falls evaluation completed Falls evaluation completed Falls evaluation completed  Falls evaluation completed    MEDICARE RISK AT HOME:  Medicare Risk at Home Any stairs in or around the home?: Yes If so, are there any without handrails?: No Home free of loose throw rugs in walkways, pet beds, electrical cords, etc?: Yes Adequate lighting in your home to reduce risk of falls?: Yes Life alert?: No Use of a cane, walker or w/c?: Yes Grab bars in the bathroom?: Yes Shower chair or bench in shower?: No Elevated toilet seat or a handicapped toilet?: No Patient currently uses a walker as needed due to a torn mensicus  TIMED UP AND GO:  Was the test performed?  No  Cognitive Function: 6CIT completed    08/29/2017    2:02 PM  MMSE - Mini Mental State Exam  Orientation to time 5  Orientation to Place 5  Registration 3  Attention/ Calculation 5  Recall 3  Language- name 2 objects 2  Language- repeat 1  Language- follow 3 step command 3  Language- read & follow direction 1  Write a sentence 1  Copy design 1  Total score 30        12/09/2023    8:05 AM 12/08/2022    1:21 PM 12/04/2021    1:50 PM 12/03/2019    1:45 PM 10/12/2018    2:41 PM  6CIT Screen  What Year? 0 points 0 points 0 points 0 points 0 points  What month? 0 points 0 points 0 points 0 points 0 points  What time? 0 points 0 points 0 points 0 points 0 points  Count back from 20 0 points 0 points 0 points 0 points 0 points  Months in reverse 0 points 0 points 0 points 0 points 0 points  Repeat phrase 0 points 0 points 2 points 2 points 0 points  Total Score 0 points 0 points 2 points 2 points 0 points    Immunizations Immunization History  Administered Date(s) Administered   Fluad Quad(high Dose 65+) 01/18/2019, 02/07/2020, 02/17/2021, 01/22/2022    Influenza, High Dose Seasonal PF 03/20/2013, 02/24/2016, 02/07/2017, 03/10/2018   Influenza,inj,Quad PF,6+ Mos 02/19/2014, 02/06/2015   Moderna Sars-Covid-2 Vaccination 07/04/2019, 08/01/2019, 03/27/2020, 09/23/2020, 02/14/2021   Pneumococcal Conjugate-13 08/05/2014   Pneumococcal Polysaccharide-23 02/07/2009   Tdap 01/07/2011   Zoster Recombinant(Shingrix ) 04/10/2019, 06/23/2022   Zoster,  Live 05/10/2010    Screening Tests Health Maintenance  Topic Date Due   COVID-19 Vaccine (6 - 2024-25 season) 01/09/2023   OPHTHALMOLOGY EXAM  02/04/2023   INFLUENZA VACCINE  12/09/2023   DTaP/Tdap/Td (2 - Td or Tdap) 04/17/2024 (Originally 01/06/2021)   MAMMOGRAM  03/20/2024   HEMOGLOBIN A1C  05/12/2024   Diabetic kidney evaluation - Urine ACR  08/03/2024   Diabetic kidney evaluation - eGFR measurement  11/09/2024   FOOT EXAM  11/09/2024   Medicare Annual Wellness (AWV)  12/08/2024   DEXA SCAN  01/05/2025   Colonoscopy  10/26/2027   Pneumococcal Vaccine: 50+ Years  Completed   Zoster Vaccines- Shingrix   Completed   Hepatitis B Vaccines  Aged Out   HPV VACCINES  Aged Out   Meningococcal B Vaccine  Aged Out   Hepatitis C Screening  Discontinued    Health Maintenance  Health Maintenance Due  Topic Date Due   COVID-19 Vaccine (6 - 2024-25 season) 01/09/2023   OPHTHALMOLOGY EXAM  02/04/2023   INFLUENZA VACCINE  12/09/2023   Health Maintenance Items Addressed: Mammogram ordered  Additional Screening:  Vision Screening: Recommended annual ophthalmology exams for early detection of glaucoma and other disorders of the eye. Would you like a referral to an eye doctor? No   Last vision office visit notes requested from Dr. Molli office Dental Screening: Recommended annual dental exams for proper oral hygiene  Community Resource Referral / Chronic Care Management: CRR required this visit?  No   CCM required this visit?  No   Plan:    I have personally reviewed and noted the  following in the patient's chart:   Medical and social history Use of alcohol, tobacco or illicit drugs  Current medications and supplements including opioid prescriptions. Patient is not currently taking opioid prescriptions. Functional ability and status Nutritional status Physical activity Advanced directives List of other physicians Hospitalizations, surgeries, and ER visits in previous 12 months Vitals Screenings to include cognitive, depression, and falls Referrals and appointments  In addition, I have reviewed and discussed with patient certain preventive protocols, quality metrics, and best practice recommendations. A written personalized care plan for preventive services as well as general preventive health recommendations were provided to patient.   Masato Pettie, CMA   12/09/2023   After Visit Summary: (MyChart) Due to this being a telephonic visit, the after visit summary with patients personalized plan was offered to patient via MyChart   Notes: Nothing significant to report at this time.

## 2023-12-09 NOTE — Patient Instructions (Addendum)
 Karen Potter , Thank you for taking time out of your busy schedule to complete your Annual Wellness Visit with me. I enjoyed our conversation and look forward to speaking with you again next year. I, as well as your care team,  appreciate your ongoing commitment to your health goals. Please review the following plan we discussed and let me know if I can assist you in the future. Your Game plan/ To Do List    Referrals: If you haven't heard from the office you've been referred to, please reach out to them at the phone provided.   Follow up Visits: We will see or speak with you next year for your Next Medicare AWV with our clinical staff December 11, 2024 at 8:00 am Have you seen your provider in the last 6 months (3 months if uncontrolled diabetes)? Yes  Clinician Recommendations:  Aim for 30 minutes of exercise or brisk walking, 6-8 glasses of water , and 5 servings of fruits and vegetables each day.   BEST OF LUCK WITH YOUR SURGERY AND HAPPY HAPPY BIRTHDAY      This is a list of the screenings recommended for you:  Health Maintenance  Topic Date Due   COVID-19 Vaccine (6 - 2024-25 season) 01/09/2023   Eye exam for diabetics  02/04/2023   Flu Shot  12/09/2023   DTaP/Tdap/Td vaccine (2 - Td or Tdap) 04/17/2024*   Mammogram  03/20/2024   Hemoglobin A1C  05/12/2024   Yearly kidney health urinalysis for diabetes  08/03/2024   Yearly kidney function blood test for diabetes  11/09/2024   Complete foot exam   11/09/2024   Medicare Annual Wellness Visit  12/08/2024   DEXA scan (bone density measurement)  01/05/2025   Colon Cancer Screening  10/26/2027   Pneumococcal Vaccine for age over 55  Completed   Zoster (Shingles) Vaccine  Completed   Hepatitis B Vaccine  Aged Out   HPV Vaccine  Aged Out   Meningitis B Vaccine  Aged Out   Hepatitis C Screening  Discontinued  *Topic was postponed. The date shown is not the original due date.   I WILL MAIL THIS INFORMATION TO YOU.   Advanced  directives: (Provided) Advance directive discussed with you today. I have provided a copy for you to complete at home and have notarized. Once this is complete, please bring a copy in to our office so we can scan it into your chart.  Advance Care Planning is important because it:  [x]  Makes sure you receive the medical care that is consistent with your values, goals, and preferences  [x]  It provides guidance to your family and loved ones and reduces their decisional burden about whether or not they are making the right decisions based on your wishes.  Follow the link provided in your after visit summary or read over the paperwork we have mailed to you to help you started getting your Advance Directives in place. If you need assistance in completing these, please reach out to us  so that we can help you!  See attachments for Preventive Care and Fall Prevention Tips.

## 2023-12-14 DIAGNOSIS — S83242A Other tear of medial meniscus, current injury, left knee, initial encounter: Secondary | ICD-10-CM | POA: Diagnosis not present

## 2024-01-17 DIAGNOSIS — G8918 Other acute postprocedural pain: Secondary | ICD-10-CM | POA: Diagnosis not present

## 2024-01-17 DIAGNOSIS — M94262 Chondromalacia, left knee: Secondary | ICD-10-CM | POA: Diagnosis not present

## 2024-01-17 DIAGNOSIS — M23322 Other meniscus derangements, posterior horn of medial meniscus, left knee: Secondary | ICD-10-CM | POA: Diagnosis not present

## 2024-02-06 ENCOUNTER — Other Ambulatory Visit: Payer: Self-pay | Admitting: Family Medicine

## 2024-02-10 ENCOUNTER — Encounter: Payer: Self-pay | Admitting: Family Medicine

## 2024-02-10 ENCOUNTER — Ambulatory Visit (INDEPENDENT_AMBULATORY_CARE_PROVIDER_SITE_OTHER): Admitting: Family Medicine

## 2024-02-10 VITALS — BP 123/68 | HR 68 | Temp 97.0°F | Ht 64.0 in | Wt 232.0 lb

## 2024-02-10 DIAGNOSIS — E1159 Type 2 diabetes mellitus with other circulatory complications: Secondary | ICD-10-CM

## 2024-02-10 DIAGNOSIS — Z9889 Other specified postprocedural states: Secondary | ICD-10-CM

## 2024-02-10 DIAGNOSIS — E785 Hyperlipidemia, unspecified: Secondary | ICD-10-CM

## 2024-02-10 DIAGNOSIS — E039 Hypothyroidism, unspecified: Secondary | ICD-10-CM | POA: Diagnosis not present

## 2024-02-10 DIAGNOSIS — E1169 Type 2 diabetes mellitus with other specified complication: Secondary | ICD-10-CM | POA: Diagnosis not present

## 2024-02-10 DIAGNOSIS — E1140 Type 2 diabetes mellitus with diabetic neuropathy, unspecified: Secondary | ICD-10-CM

## 2024-02-10 DIAGNOSIS — I152 Hypertension secondary to endocrine disorders: Secondary | ICD-10-CM | POA: Diagnosis not present

## 2024-02-10 LAB — BAYER DCA HB A1C WAIVED: HB A1C (BAYER DCA - WAIVED): 6.4 % — ABNORMAL HIGH (ref 4.8–5.6)

## 2024-02-10 NOTE — Progress Notes (Signed)
 BP 123/68   Pulse 68   Temp (!) 97 F (36.1 C) (Temporal)   Ht 5' 4 (1.626 m)   Wt 232 lb (105.2 kg)   SpO2 100%   BMI 39.82 kg/m    Subjective:   Patient ID: Karen Potter, female    DOB: Dec 25, 1940, 83 y.o.   MRN: [id number]  HPI: Karen Potter is a 83 y.o. female presenting on 02/10/2024 for Medical Management of Chronic Issues   Discussed the use of AI scribe software for clinical note transcription with the patient, who gave verbal consent to proceed.  History of Present Illness   Karen Potter is an 83 year old female with diabetes who presents for a recheck of her blood sugar levels.  Glycemic control - Fluctuating blood glucose levels ranging from 130 mg/dL to 819 mg/dL - Currently managed with Januvia  and Farxiga  - Januvia  prescription may require renewal soon  Cutaneous findings - Skin tears and bruises present on arms and side - Removed a worsening scab, resulting in perceived improvement - Applies Neosporin to affected areas  Postoperative knee discomfort and mobility - Recent knee surgery - Ongoing knee discomfort, exacerbated by prolonged activity - Not participating in formal physical therapy - Uses a cane for ambulation within the home - Not performing specific exercises, but considering use of home exercise equipment      Relevant past medical, surgical, family and social history reviewed and updated as indicated. Interim medical history since our last visit reviewed. Allergies and medications reviewed and updated.  Review of Systems  Constitutional:  Negative for chills and fever.  HENT:  Negative for congestion, ear discharge and ear pain.   Eyes:  Negative for redness and visual disturbance.  Respiratory:  Negative for chest tightness and shortness of breath.   Cardiovascular:  Negative for chest pain and leg swelling.  Genitourinary:  Negative for difficulty urinating and dysuria.  Musculoskeletal:  Positive for arthralgias and gait  problem. Negative for back pain.  Skin:  Negative for rash.  Neurological:  Negative for light-headedness and headaches.  Psychiatric/Behavioral:  Negative for agitation and behavioral problems.   All other systems reviewed and are negative. Mood  Per HPI unless specifically indicated above   Allergies as of 02/10/2024       Reactions   Codeine Itching   Dilaudid  [hydromorphone ] Other (See Comments)   Mouth blisters   Other Itching   Most pain meds cause itching.  When has to take pain medication, she has been instructed to take Benadryl    Hydrocodone -acetaminophen  Other (See Comments)   Doxycycline Rash, Other (See Comments)        Medication List        Accurate as of February 10, 2024 10:41 AM. If you have any questions, ask your nurse or doctor.          STOP taking these medications    predniSONE  20 MG tablet Commonly known as: DELTASONE  Stopped by: Fonda LABOR Sonam Huelsmann       TAKE these medications    acetaminophen  650 MG CR tablet Commonly known as: TYLENOL  Take 650 mg by mouth in the morning.   allopurinol  100 MG tablet Commonly known as: ZYLOPRIM  Take 1 tablet (100 mg total) by mouth daily.   Blood Glucose Monitor System w/Device Kit Use glucose meter to check blood sugar up to 3 times daily as instructed. One Touch Verio preferred by insurance. Dx: EE11.69   carvedilol  6.25 MG tablet Commonly known as:  COREG  Take 1 tablet (6.25 mg total) by mouth 2 (two) times daily with a meal.   clobetasol  ointment 0.05 % Commonly known as: TEMOVATE  Apply 1 Application topically 2 (two) times daily.   clopidogrel  75 MG tablet Commonly known as: PLAVIX  Take 1 tablet (75 mg total) by mouth every evening.   clotrimazole -betamethasone  cream Commonly known as: LOTRISONE  Apply 1 Application topically daily.   dapagliflozin  propanediol 10 MG Tabs tablet Commonly known as: FARXIGA  Take 1 tablet (10 mg total) by mouth daily before breakfast.   Elderberry 500 MG  Caps Take 500 mg by mouth daily.   furosemide  40 MG tablet Commonly known as: LASIX  Take 1 tablet (40 mg total) by mouth daily.   L-FORMULA LYSINE HCL PO Take 1 Dose by mouth daily.   Lancets Misc. Kit Use patient & insurance preferred lancet device to test blood glucose up to 3 times daily for Dx: E11.69   levothyroxine  100 MCG tablet Commonly known as: SYNTHROID  Take 1 tablet by mouth once daily   losartan  25 MG tablet Commonly known as: COZAAR  Take 0.5 tablets (12.5 mg total) by mouth daily.   nystatin  powder Commonly known as: MYCOSTATIN /NYSTOP  APPLY  POWDER TOPICALLY TO AFFECTED AREA THREE TIMES DAILY   OneTouch Verio test strip Generic drug: glucose blood USE 1 STRIP TO CHECK GLUCOSE UP TO THREE TIMES DAILY AS  INSTRUCTED   pantoprazole  40 MG tablet Commonly known as: PROTONIX  Take 1 tablet (40 mg total) by mouth daily.   rosuvastatin  5 MG tablet Commonly known as: CRESTOR  Take 1 tablet (5 mg total) by mouth daily.   sitaGLIPtin  100 MG tablet Commonly known as: Januvia  Take 1 tablet (100 mg total) by mouth daily.   Vitamin D3 50 MCG (2000 UT) capsule Take 2,000 Units by mouth 2 (two) times daily.         Objective:   BP 123/68   Pulse 68   Temp (!) 97 F (36.1 C) (Temporal)   Ht 5' 4 (1.626 m)   Wt 232 lb (105.2 kg)   SpO2 100%   BMI 39.82 kg/m   Wt Readings from Last 3 Encounters:  02/10/24 232 lb (105.2 kg)  12/09/23 223 lb (101.2 kg)  11/15/23 225 lb (102.1 kg)    Physical Exam Physical Exam   VITALS: BP- 123/68 CHEST: Lungs clear to auscultation. CARDIOVASCULAR: Heart regular rate and rhythm, no murmurs.         Assessment & Plan:   Problem List Items Addressed This Visit       Cardiovascular and Mediastinum   Hypertension associated with diabetes (HCC)     Endocrine   Hyperlipidemia associated with type 2 diabetes mellitus (HCC)   Hypothyroid   Type 2 diabetes mellitus with other specified complication (HCC) - Primary    Relevant Orders   Bayer DCA Hb A1c Waived   Type 2 diabetes mellitus with diabetic neuropathy, without long-term current use of insulin  (HCC)   Other Visit Diagnoses       Status post arthroscopy of left knee              Status post recent knee arthroscopy Recent knee arthroscopy with ongoing discomfort. Advised exercises to strengthen knee and improve mobility. - Engage in exercises such as using a stationary bicycle, going up and down steps, and using resistance bands or towels for leg exercises. - Consider using leg weights during exercises to build muscle strength. - Encourage regular movement and exercise while sitting.  Type 2  diabetes mellitus Blood glucose levels fluctuating between 130s and 180s. Hemoglobin A1c at 6.4, indicating good control. Current medications include Januvia  and Farxiga , well-tolerated. - Continue Januvia  and Farxiga . - Coordinate with Mliss to renew Januvia  prescription for next year.  Hypothyroidism Thyroid  function to be re-evaluated with upcoming blood work. - Recheck thyroid  levels with blood work.  Senile purpura and skin fragility Skin tears and bruising due to age-related skin thinning and fragility. No new medications contributing. - Apply Neosporin twice a day to skin tears. - Avoid using peroxide on skin tears.          Follow up plan: Return in about 3 months (around 05/12/2024), or if symptoms worsen or fail to improve, for Type 2 diabetes.  Counseling provided for all of the vaccine components Orders Placed This Encounter  Procedures   Bayer DCA Hb A1c Waived    Fonda Levins, MD [facility] 02/10/2024, 10:41 AM

## 2024-02-23 DIAGNOSIS — E119 Type 2 diabetes mellitus without complications: Secondary | ICD-10-CM | POA: Diagnosis not present

## 2024-02-23 DIAGNOSIS — H53143 Visual discomfort, bilateral: Secondary | ICD-10-CM | POA: Diagnosis not present

## 2024-02-23 LAB — HM DIABETES EYE EXAM

## 2024-03-02 ENCOUNTER — Telehealth: Payer: Self-pay

## 2024-03-02 NOTE — Telephone Encounter (Signed)
   Farxiga  conditionally approved for 2026.  Will need to re-enroll with [employer] for Januvia  assistance.

## 2024-03-07 NOTE — Telephone Encounter (Signed)
 [employer] application placed in mail.

## 2024-03-15 NOTE — Telephone Encounter (Signed)
 PAP: Patient assistance application for Farxiga  has been approved by PAP Companies: [employer] from 05/10/24 to 05/09/25.   Medication should be delivered to: Home.   For further shipping updates, please contact [employer] ([employer]) at [phone].   Patient ID is: [id number]   ______   STEVENSON (Januvia ) re-enrollment pending.

## 2024-03-20 NOTE — Telephone Encounter (Signed)
 Rec'd patients completed [employer] application.   Emailed provider pages to [employer].

## 2024-03-21 ENCOUNTER — Ambulatory Visit

## 2024-03-21 DIAGNOSIS — M25562 Pain in left knee: Secondary | ICD-10-CM | POA: Diagnosis not present

## 2024-03-21 DIAGNOSIS — Z5189 Encounter for other specified aftercare: Secondary | ICD-10-CM | POA: Diagnosis not present

## 2024-03-28 NOTE — Telephone Encounter (Signed)
 PAP: RE-ENROLLMENT application for JANUVIA  has been submitted to [employer], via fax

## 2024-03-29 ENCOUNTER — Other Ambulatory Visit: Payer: Self-pay | Admitting: Medical Genetics

## 2024-03-29 ENCOUNTER — Other Ambulatory Visit (HOSPITAL_COMMUNITY)
Admission: RE | Admit: 2024-03-29 | Discharge: 2024-03-29 | Disposition: A | Discharge status: Home / Self Care | Payer: Self-pay | Source: Ambulatory Visit | Attending: Medical Genetics | Admitting: Medical Genetics

## 2024-04-04 ENCOUNTER — Ambulatory Visit

## 2024-04-09 LAB — GENECONNECT MOLECULAR SCREEN: Genetic Analysis Overall Interpretation: NEGATIVE

## 2024-04-19 NOTE — Telephone Encounter (Signed)
 Refaxed to [employer]

## 2024-04-20 ENCOUNTER — Telehealth: Payer: Self-pay

## 2024-04-20 DIAGNOSIS — M25562 Pain in left knee: Secondary | ICD-10-CM | POA: Diagnosis not present

## 2024-04-20 NOTE — Telephone Encounter (Signed)
 Completed attestation letter received from patient.   Faxed to [employer].

## 2024-04-20 NOTE — Telephone Encounter (Signed)
 I called and spoke with patient. She says she has a patient assistance form that she was told by [employer] that needs to be filled out by PCP office and sent in before end of year. Patient is going to drop it off at the office. Will make sure Lavern gets this. Will forward this message to camille, just for FYI.

## 2024-04-20 NOTE — Telephone Encounter (Signed)
 [employer] re-enrollment form refaxed 04/19/24. Will contact patient.

## 2024-04-20 NOTE — Telephone Encounter (Signed)
 Contacted patient to f/u on paperwork she received from [employer].   Patient dropped off at office this morning. Informed patient I would take a look at it as soon as possible and send off to company as/if needed.

## 2024-04-20 NOTE — Telephone Encounter (Signed)
 Copied from CRM #[id number]. Topic: Clinical - Medical Advice >> Apr 20, 2024 10:07 AM Alfonso ORN wrote: Reason for CRM: patient requesting  Lavern sailor, Everette the Pharmacy Technician  phone number reason need to talk with her getting assistance with medication pt. can not afford  , patient has a form for assistance program from [employer] to bring in need to  Fax by the provider

## 2024-04-30 ENCOUNTER — Inpatient Hospital Stay: Admission: RE | Admit: 2024-04-30 | Discharge: 2024-04-30 | Attending: Family Medicine

## 2024-04-30 DIAGNOSIS — Z1231 Encounter for screening mammogram for malignant neoplasm of breast: Secondary | ICD-10-CM

## 2024-05-07 ENCOUNTER — Other Ambulatory Visit: Payer: Self-pay | Admitting: Family Medicine

## 2024-05-07 DIAGNOSIS — B372 Candidiasis of skin and nail: Secondary | ICD-10-CM

## 2024-05-07 DIAGNOSIS — L309 Dermatitis, unspecified: Secondary | ICD-10-CM

## 2024-05-11 ENCOUNTER — Emergency Department (HOSPITAL_COMMUNITY)
Admission: EM | Admit: 2024-05-11 | Discharge: 2024-05-11 | Disposition: A | Discharge status: Home / Self Care | Attending: Emergency Medicine | Admitting: Emergency Medicine

## 2024-05-11 ENCOUNTER — Emergency Department (HOSPITAL_COMMUNITY)

## 2024-05-11 DIAGNOSIS — R718 Other abnormality of red blood cells: Secondary | ICD-10-CM | POA: Diagnosis not present

## 2024-05-11 DIAGNOSIS — Z7902 Long term (current) use of antithrombotics/antiplatelets: Secondary | ICD-10-CM | POA: Insufficient documentation

## 2024-05-11 DIAGNOSIS — Z79899 Other long term (current) drug therapy: Secondary | ICD-10-CM | POA: Diagnosis not present

## 2024-05-11 DIAGNOSIS — K625 Hemorrhage of anus and rectum: Secondary | ICD-10-CM | POA: Diagnosis present

## 2024-05-11 LAB — COMPREHENSIVE METABOLIC PANEL WITH GFR
ALT: 16 U/L (ref 0–44)
AST: 26 U/L (ref 15–41)
Albumin: 4.1 g/dL (ref 3.5–5.0)
Alkaline Phosphatase: 108 U/L (ref 38–126)
Anion gap: 14 (ref 5–15)
BUN: 10 mg/dL (ref 8–23)
CO2: 27 mmol/L (ref 22–32)
Calcium: 9.5 mg/dL (ref 8.9–10.3)
Chloride: 104 mmol/L (ref 98–111)
Creatinine, Ser: 0.91 mg/dL (ref 0.44–1.00)
GFR, Estimated: >60 mL/min
Glucose, Bld: 131 mg/dL — ABNORMAL HIGH (ref 70–99)
Potassium: 3.3 mmol/L — ABNORMAL LOW (ref 3.5–5.1)
Sodium: 144 mmol/L (ref 135–145)
Total Bilirubin: 0.5 mg/dL (ref 0.0–1.2)
Total Protein: 7.6 g/dL (ref 6.5–8.1)

## 2024-05-11 LAB — CBC
HCT: 45.4 % (ref 36.0–46.0)
Hemoglobin: 15.1 g/dL — ABNORMAL HIGH (ref 12.0–15.0)
MCH: 31.2 pg (ref 26.0–34.0)
MCHC: 33.3 g/dL (ref 30.0–36.0)
MCV: 93.8 fL (ref 80.0–100.0)
Platelets: 271 K/uL (ref 150–400)
RBC: 4.84 MIL/uL (ref 3.87–5.11)
RDW: 14.1 % (ref 11.5–15.5)
WBC: 9.9 K/uL (ref 4.0–10.5)
nRBC: 0 % (ref 0.0–0.2)

## 2024-05-11 MED ORDER — IOHEXOL 300 MG/ML  SOLN
100.0000 mL | Freq: Once | INTRAMUSCULAR | Status: AC | PRN
  Administered 2024-05-11: 100 mL via INTRAVENOUS

## 2024-05-11 NOTE — ED Provider Notes (Signed)
 " [place] EMERGENCY DEPARTMENT AT [facility] Provider Note   CSN: [id number] Arrival date & time: 05/11/24  1453     Patient presents with: Rectal Bleeding   Karen Potter is a 84 y.o. female.   84 year old female here today for rectal bleeding for the last 2 to 3 days.  Patient reports that when she has a bowel movement, she has bright red blood.  She denies any abdominal pain.  She does take Plavix .  She has always received her regularly scheduled colonoscopies.   Rectal Bleeding      Prior to Admission medications  Medication Sig Start Date End Date Taking? Authorizing Provider  acetaminophen  (TYLENOL ) 650 MG CR tablet Take 650 mg by mouth in the morning.    [provider]  allopurinol  (ZYLOPRIM ) 100 MG tablet Take 1 tablet (100 mg total) by mouth daily. 11/10/23   Dettinger, Fonda LABOR, MD  Blood Glucose Monitoring Suppl (BLOOD GLUCOSE MONITOR SYSTEM) w/Device KIT Use glucose meter to check blood sugar up to 3 times daily as instructed. One Touch Verio preferred by insurance. Dx: EE11.69 05/12/18   Dettinger, Fonda LABOR, MD  carvedilol  (COREG ) 6.25 MG tablet Take 1 tablet (6.25 mg total) by mouth 2 (two) times daily with a meal. 11/10/23   Dettinger, Fonda LABOR, MD  Cholecalciferol (VITAMIN D3) 2000 UNITS capsule Take 2,000 Units by mouth 2 (two) times daily.     [provider]  clobetasol  ointment (TEMOVATE ) 0.05 % Apply 1 Application topically 2 (two) times daily. 12/06/22   Joesph Annabella HERO, FNP  clopidogrel  (PLAVIX ) 75 MG tablet Take 1 tablet (75 mg total) by mouth every evening. 11/10/23   Dettinger, Fonda LABOR, MD  clotrimazole -betamethasone  (LOTRISONE ) cream APPLY  CREAM TOPICALLY TO AFFECTED AREA ONCE DAILY 05/07/24   Dettinger, Fonda LABOR, MD  dapagliflozin  propanediol (FARXIGA ) 10 MG TABS tablet Take 1 tablet (10 mg total) by mouth daily before breakfast. 10/13/23   Dettinger, Fonda LABOR, MD  Elderberry 500 MG CAPS Take 500 mg by mouth daily.    [provider]  furosemide  (LASIX ) 40 MG tablet Take 1 tablet (40 mg total) by mouth daily. 08/04/23   Dettinger, Fonda LABOR, MD  glucose blood (ONETOUCH VERIO) test strip USE 1 STRIP TO CHECK GLUCOSE UP TO THREE TIMES DAILY AS  INSTRUCTED 11/10/23   Dettinger, Fonda LABOR, MD  L-FORMULA LYSINE HCL PO Take 1 Dose by mouth daily. Patient not taking: Reported on 02/10/2024    [provider]  Lancets Misc. KIT Use patient & insurance preferred lancet device to test blood glucose up to 3 times daily for Dx: E11.69 11/10/23   Dettinger, Fonda LABOR, MD  levothyroxine  (SYNTHROID ) 100 MCG tablet Take 1 tablet by mouth once daily 02/06/24   Dettinger, Fonda LABOR, MD  losartan  (COZAAR ) 25 MG tablet Take 0.5 tablets (12.5 mg total) by mouth daily. 08/04/23   Dettinger, Fonda LABOR, MD  nystatin  (MYCOSTATIN /NYSTOP ) powder APPLY POWDER TOPICALLY TO AFFECTED AREA 3 TIMES DAILY 05/07/24   Dettinger, Fonda LABOR, MD  pantoprazole  (PROTONIX ) 40 MG tablet Take 1 tablet (40 mg total) by mouth daily. 11/10/23   Dettinger, Fonda LABOR, MD  rosuvastatin  (CRESTOR ) 5 MG tablet Take 1 tablet (5 mg total) by mouth daily. 08/04/23   Dettinger, Fonda LABOR, MD  sitaGLIPtin  (JANUVIA ) 100 MG tablet Take 1 tablet (100 mg total) by mouth daily. 08/04/23   Dettinger, Fonda LABOR, MD    Allergies: Codeine, Dilaudid  [place] ], Other, Hydrocodone -acetaminophen , and Doxycycline  Review of Systems  Gastrointestinal:  Positive for hematochezia.    Updated Vital Signs BP 136/86 (BP Location: Left Arm)   Pulse 69   Temp 98.3 F (36.8 C) (Oral)   Resp 18   SpO2 98%   Physical Exam Vitals and nursing note reviewed. Exam conducted with a chaperone present.  Constitutional:      Appearance: She is not ill-appearing or toxic-appearing.  Cardiovascular:     Rate and Rhythm: Normal rate.  Pulmonary:     Effort: Pulmonary effort is normal.  Abdominal:     General: Abdomen is flat.     Palpations: Abdomen is soft.  Genitourinary:    Comments:  Nurse chaperone Holly present.  No blood on digital rectal exam, no stool in rectal vault. Musculoskeletal:        General: Normal range of motion.  Neurological:     General: No focal deficit present.     Mental Status: She is alert.     (all labs ordered are listed, but only abnormal results are displayed) Labs Reviewed  COMPREHENSIVE METABOLIC PANEL WITH GFR - Abnormal; Notable for the following components:      Result Value   Potassium 3.3 (*)    Glucose, Bld 131 (*)    All other components within normal limits  CBC - Abnormal; Notable for the following components:   Hemoglobin 15.1 (*)    All other components within normal limits    EKG: None  Radiology: CT ABDOMEN PELVIS W CONTRAST Result Date: 05/11/2024 CLINICAL DATA:  Rectal bleeding EXAM: CT ABDOMEN AND PELVIS WITH CONTRAST TECHNIQUE: Multidetector CT imaging of the abdomen and pelvis was performed using the standard protocol following bolus administration of intravenous contrast. RADIATION DOSE REDUCTION: This exam was performed according to the departmental dose-optimization program which includes automated exposure control, adjustment of the mA and/or kV according to patient size and/or use of iterative reconstruction technique. CONTRAST:  OMNIPAQUE  IOHEXOL  300 MG/ML  SOLN COMPARISON:  CT 08/17/2019, 02/23/2018 FINDINGS: Lower chest: Lung bases demonstrate no acute airspace disease. Mitral calcification. Coronary vascular calcification Hepatobiliary: Cholecystectomy. Stable enlargement of the common bile duct likely due to postsurgical change. Mild intra hepatic biliary dilatation as before Pancreas: Unremarkable. No pancreatic ductal dilatation or surrounding inflammatory changes. Spleen: Normal in size without focal abnormality. Adrenals/Urinary Tract: Adrenal glands are within normal limits. 16 mm hypodensity in the midpole right kidney with slight increased density values. Subcentimeter hypodensities too small to  further characterize, no imaging follow-up is recommended. Exophytic lesion lower pole left kidney measures slightly smaller compared to prior exams but more dense, this measures 3.4 cm compared with 3.6 cm previously. The bladder is decompressed Stomach/Bowel: Stomach nonenlarged. No dilated small bowel. Diverticular disease of the colon without acute wall thickening Vascular/Lymphatic: Aortic atherosclerosis. No enlarged abdominal or pelvic lymph nodes. Reproductive: Hysterectomy.  No adnexal mass Other: Since no ascites or free air Musculoskeletal: Posterior spinal fusion hardware L4-L5. No acute osseous abnormality IMPRESSION: 1. No CT evidence for acute intra-abdominal or pelvic abnormality. 2. 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 3. 16 mm indeterminate hypodensity midpole right kidney. This could be assessed with nonemergent renal CT or MRI. Presumed complicated cyst lower pole left kidney, present since more remote exams from 2019 but smaller in size and slightly more dense on today's exam, no specific imaging follow-up recommended for this finding. 4. Aortic atherosclerosis. Aortic Atherosclerosis (ICD10-I70.0). Electronically Signed   By: Luke Bun M.D.   On: 05/11/2024 23:26  Procedures   Medications Ordered in the ED  iohexol  (OMNIPAQUE ) 300 MG/ML solution 100 mL (100 mLs Intravenous Contrast Given 05/11/24 2211)                                    Medical Decision Making 84 year old female here today for bright red blood per rectum.  Differential diagnoses include internal hemorrhoids, diverticular bleeding, less likely brisk GI bleeding, less likely upper GI bleed.  Plan -patient's hemoglobin today is 15.1.  Have blood work over the last 9 months which shows a stable hemoglobin in the mid 14's.  I do not have any blood on digital rectal exam.  She is normotensive, nontachycardic, ambulatory with her walker which is her baseline.  Patient overall appears well.  Ct imaging of the patient's abdomen shows diverticular disease without diverticulitis.  This is likely the cause of her bleeding.  Will have the patient hold her Plavix  for 2 days, follow-up with her PCP.  Incidental hypodensity in the kidney.  Discussed with patient.  She will follow-up with her PCP.  I considered admission for this patient, however given her reassuring workup she is appropriate for discharge.  Amount and/or Complexity of Data Reviewed Labs: ordered. Radiology: ordered.  Risk Prescription drug management.        Final diagnoses:  Rectal bleeding    ED Discharge Orders     None          Mannie Fairy DASEN, DO 05/11/24 2332  "

## 2024-05-11 NOTE — Discharge Instructions (Signed)
 Your red blood cell count today was normal.  I would like you to hold your clopidogrel  for 2 days.  I think that your bleeding is being caused by diverticular bleeding.  This typically will go away after a few days.  Return to the emergency room if you feel lightheaded, lose consciousness, or have any increase in your bleeding.  Follow-up with your primary care doctor within 1 week.

## 2024-05-11 NOTE — ED Triage Notes (Signed)
 C/o rectal bleeding for the last 2-3 days, worsening today. Bright red blood in the commode, on the toilet paper, then on the floor when she stood up. Denies any pain.

## 2024-05-14 ENCOUNTER — Ambulatory Visit: Payer: Self-pay

## 2024-05-14 NOTE — Telephone Encounter (Signed)
 Pt aware of provider feedback and denies any dizziness, chest pain or light headedness. Pt aware if she develops symptoms to go back to ED.

## 2024-05-14 NOTE — Telephone Encounter (Signed)
 Pt states she was in the ED over the weekend for rectal bleeding. Pt was told to stop taking her plavix  for 3 days and that the bleeding was due to her diverticular. The pt states that the bleeding has not changed, states that she also feels the same since the ED visit. Pt also asking if she should continue to hold the plavix  or if she she restart the plavix . Pt denies dizziness, SOB, lightheadedness. Pt requesting call back from PCP. Attempted to schedule today at clinic, no appts with any providers today.   Copied from CRM [id number]. Topic: Clinical - Red Word Triage >> May 14, 2024  9:06 AM Marda MATSU wrote: Red Word that prompted transfer to Nurse Triage: Rectal Bleeding

## 2024-05-14 NOTE — Telephone Encounter (Signed)
 Pt has reg check up 1/8 with Dr. Maryanne.  Do you need her seen sooner for this or have recommendations until the visit?

## 2024-05-14 NOTE — Telephone Encounter (Signed)
 If she is still bleeding then I would definitely have her continue to hold the Plavix .  Please see if you can get her an appointment sooner than the eighth, if not we will see her on the eighth.  If the bleeding worsens or she is lightheaded or dizzy or has chest pain then go back to the emergency department Fonda Levins, MD [facility] 05/14/2024, 11:24 AM

## 2024-05-14 NOTE — Telephone Encounter (Signed)
 Pt made aware of Dr. Williemae recommendations. Pt had bleeding last pm with a BM. Happens every time with BM. She will continue to hold Plavix  and will keep appt with Dettinger. Pt states that she was told she has diverticulitis.

## 2024-05-17 ENCOUNTER — Encounter: Payer: Self-pay | Admitting: Family Medicine

## 2024-05-17 ENCOUNTER — Ambulatory Visit: Payer: Self-pay | Admitting: Family Medicine

## 2024-05-17 VITALS — BP 125/78 | HR 68 | Temp 98.2°F | Ht 64.0 in | Wt 233.0 lb

## 2024-05-17 DIAGNOSIS — E785 Hyperlipidemia, unspecified: Secondary | ICD-10-CM

## 2024-05-17 DIAGNOSIS — E1159 Type 2 diabetes mellitus with other circulatory complications: Secondary | ICD-10-CM

## 2024-05-17 DIAGNOSIS — E1169 Type 2 diabetes mellitus with other specified complication: Secondary | ICD-10-CM | POA: Diagnosis not present

## 2024-05-17 DIAGNOSIS — I152 Hypertension secondary to endocrine disorders: Secondary | ICD-10-CM | POA: Diagnosis not present

## 2024-05-17 DIAGNOSIS — Z7984 Long term (current) use of oral hypoglycemic drugs: Secondary | ICD-10-CM

## 2024-05-17 DIAGNOSIS — E039 Hypothyroidism, unspecified: Secondary | ICD-10-CM

## 2024-05-17 DIAGNOSIS — K921 Melena: Secondary | ICD-10-CM | POA: Diagnosis not present

## 2024-05-17 LAB — TSH: TSH: 7.64 u[IU]/mL — ABNORMAL HIGH (ref 0.450–4.500)

## 2024-05-17 LAB — BASIC METABOLIC PANEL WITH GFR
BUN/Creatinine Ratio: 11 — ABNORMAL LOW (ref 12–28)
BUN: 9 mg/dL (ref 8–27)
CO2: 26 mmol/L (ref 20–29)
Calcium: 9.3 mg/dL (ref 8.7–10.3)
Chloride: 102 mmol/L (ref 96–106)
Creatinine, Ser: 0.85 mg/dL (ref 0.57–1.00)
Glucose: 154 mg/dL — ABNORMAL HIGH (ref 70–99)
Potassium: 3.4 mmol/L — ABNORMAL LOW (ref 3.5–5.2)
Sodium: 144 mmol/L (ref 134–144)
eGFR: 68 mL/min/1.73

## 2024-05-17 LAB — CBC WITH DIFFERENTIAL/PLATELET
Basophils Absolute: 0.1 x10E3/uL (ref 0.0–0.2)
Basos: 1 %
EOS (ABSOLUTE): 0.3 x10E3/uL (ref 0.0–0.4)
Eos: 4 %
Hematocrit: 42.6 % (ref 34.0–46.6)
Hemoglobin: 13.9 g/dL (ref 11.1–15.9)
Immature Grans (Abs): 0 x10E3/uL (ref 0.0–0.1)
Immature Granulocytes: 0 %
Lymphocytes Absolute: 1.9 x10E3/uL (ref 0.7–3.1)
Lymphs: 26 %
MCH: 31.3 pg (ref 26.6–33.0)
MCHC: 32.6 g/dL (ref 31.5–35.7)
MCV: 96 fL (ref 79–97)
Monocytes Absolute: 0.9 x10E3/uL (ref 0.1–0.9)
Monocytes: 12 %
Neutrophils Absolute: 4.3 x10E3/uL (ref 1.4–7.0)
Neutrophils: 57 %
Platelets: 255 x10E3/uL (ref 150–450)
RBC: 4.44 x10E6/uL (ref 3.77–5.28)
RDW: 13.2 % (ref 11.7–15.4)
WBC: 7.6 x10E3/uL (ref 3.4–10.8)

## 2024-05-17 LAB — BAYER DCA HB A1C WAIVED: HB A1C (BAYER DCA - WAIVED): 6.1 % — ABNORMAL HIGH (ref 4.8–5.6)

## 2024-05-17 LAB — LIPID PANEL
Chol/HDL Ratio: 2.8 ratio (ref 0.0–4.4)
Cholesterol, Total: 105 mg/dL (ref 100–199)
HDL: 38 mg/dL — ABNORMAL LOW
LDL Chol Calc (NIH): 38 mg/dL (ref 0–99)
Triglycerides: 173 mg/dL — ABNORMAL HIGH (ref 0–149)
VLDL Cholesterol Cal: 29 mg/dL (ref 5–40)

## 2024-05-17 NOTE — Progress Notes (Signed)
 "  BP 125/78   Pulse 68   Temp 98.2 F (36.8 C)   Ht 5' 4 (1.626 m)   Wt 233 lb (105.7 kg)   SpO2 96%   BMI 39.99 kg/m    Subjective:   Patient ID: Karen Potter, female    DOB: 1940/09/02, 84 y.o.   MRN: [id number]  HPI: Karen Potter is a 84 y.o. female presenting on 05/17/2024 for Medical Management of Chronic Issues, Diabetes, Hyperlipidemia, and Hypertension   Discussed the use of AI scribe software for clinical note transcription with the patient, who gave verbal consent to proceed.  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  Rectal bleeding - Persistent rectal bleeding since May 11, 2024 ER visit. - Blood present on underwear upon standing and significant bleeding during bowel movements, described as 'blood everywhere'. - Occasional episodes of passing only blood without stool. - No pain in the rectal area or pain with defecation. - No current hemorrhoidal discomfort, though history of hemorrhoids. - Recent observation of increased mucus mixed with blood during bowel movements. - Hemoglobin was 15.1 during ER visit. - Has not resumed Plavix  since onset of bleeding.  Bowel habit changes - Bowel movements have varied in consistency. - History of pencil-like stools, recently returned to regular size. - On Sunday, passed a larger stool. - No stool at times, only blood present.  Gastrointestinal symptoms - Bloating and gassiness occurred but resolved by morning. - No fevers, chills, or body aches. - No lightheadedness or dizziness except when going to the bathroom.  Knee pain - Knee pain began November 08, 2023, diagnosed as meniscus tear. - Underwent surgery in September 2025, but symptoms persisted. - MRI in December 2025 suggested possible fracture. - Scheduled for orthopedic follow-up at end of January 2026.          Relevant past medical, surgical, family and social  history reviewed and updated as indicated. Interim medical history since our last visit reviewed. Allergies and medications reviewed and updated.  Review of Systems  Constitutional:  Negative for chills and fever.  Eyes:  Negative for visual disturbance.  Respiratory:  Negative for chest tightness and shortness of breath.   Cardiovascular:  Negative for chest pain and leg swelling.  Gastrointestinal:  Positive for blood in stool. Negative for abdominal pain, constipation, diarrhea, nausea and vomiting.  Genitourinary:  Negative for difficulty urinating and dysuria.  Musculoskeletal:  Negative for back pain and gait problem.  Skin:  Negative for rash.  Neurological:  Negative for dizziness, light-headedness and headaches.  Psychiatric/Behavioral:  Negative for agitation and behavioral problems.   All other systems reviewed and are negative.   Per HPI unless specifically indicated above   Allergies as of 05/17/2024       Reactions   Codeine Itching   Dilaudid  [hydromorphone ] Other (See Comments)   Mouth blisters   Other Itching   Most pain meds cause itching.  When has to take pain medication, she has been instructed to take Benadryl    Hydrocodone -acetaminophen  Other (See Comments)   Doxycycline Rash, Other (See Comments)        Medication List        Accurate as of May 17, 2024 11:37 AM. If you have any questions, ask your nurse or doctor.          STOP taking these medications    L-FORMULA LYSINE HCL PO Stopped  by: Fonda Levins, MD       TAKE these medications    acetaminophen  650 MG CR tablet Commonly known as: TYLENOL  Take 650 mg by mouth in the morning.   allopurinol  100 MG tablet Commonly known as: ZYLOPRIM  Take 1 tablet (100 mg total) by mouth daily.   Blood Glucose Monitor System w/Device Kit Use glucose meter to check blood sugar up to 3 times daily as instructed. One Touch Verio preferred by insurance. Dx: EE11.69   carvedilol  6.25 MG  tablet Commonly known as: COREG  Take 1 tablet (6.25 mg total) by mouth 2 (two) times daily with a meal.   clobetasol  ointment 0.05 % Commonly known as: TEMOVATE  Apply 1 Application topically 2 (two) times daily.   clopidogrel  75 MG tablet Commonly known as: PLAVIX  Take 1 tablet (75 mg total) by mouth every evening.   clotrimazole -betamethasone  cream Commonly known as: LOTRISONE  APPLY  CREAM TOPICALLY TO AFFECTED AREA ONCE DAILY   dapagliflozin  propanediol 10 MG Tabs tablet Commonly known as: FARXIGA  Take 1 tablet (10 mg total) by mouth daily before breakfast.   Elderberry 500 MG Caps Take 500 mg by mouth daily.   furosemide  40 MG tablet Commonly known as: LASIX  Take 1 tablet (40 mg total) by mouth daily.   Lancets Misc. Kit Use patient & insurance preferred lancet device to test blood glucose up to 3 times daily for Dx: E11.69   levothyroxine  100 MCG tablet Commonly known as: SYNTHROID  Take 1 tablet by mouth once daily   losartan  25 MG tablet Commonly known as: COZAAR  Take 0.5 tablets (12.5 mg total) by mouth daily.   nystatin  powder Commonly known as: MYCOSTATIN /NYSTOP  APPLY POWDER TOPICALLY TO AFFECTED AREA 3 TIMES DAILY   OneTouch Verio test strip Generic drug: glucose blood USE 1 STRIP TO CHECK GLUCOSE UP TO THREE TIMES DAILY AS  INSTRUCTED   pantoprazole  40 MG tablet Commonly known as: PROTONIX  Take 1 tablet (40 mg total) by mouth daily.   rosuvastatin  5 MG tablet Commonly known as: CRESTOR  Take 1 tablet (5 mg total) by mouth daily.   sitaGLIPtin  100 MG tablet Commonly known as: Januvia  Take 1 tablet (100 mg total) by mouth daily.   Vitamin D3 50 MCG (2000 UT) capsule Take 2,000 Units by mouth 2 (two) times daily.         Objective:   BP 125/78   Pulse 68   Temp 98.2 F (36.8 C)   Ht 5' 4 (1.626 m)   Wt 233 lb (105.7 kg)   SpO2 96%   BMI 39.99 kg/m   Wt Readings from Last 3 Encounters:  05/17/24 233 lb (105.7 kg)  02/10/24 232 lb  (105.2 kg)  12/09/23 223 lb (101.2 kg)    Physical Exam Physical Exam   VITALS: BP- 125/78 CHEST: Lungs clear to auscultation. CARDIOVASCULAR: Heart regular rate and rhythm.         Assessment & Plan:   Problem List Items Addressed This Visit       Cardiovascular and Mediastinum   Hypertension associated with diabetes (HCC)   Relevant Orders   Bayer DCA Hb A1c Waived   CBC with Differential/Platelet   Lipid panel   TSH   Basic metabolic panel with GFR     Endocrine   Hyperlipidemia associated with type 2 diabetes mellitus (HCC) - Primary   Relevant Orders   Bayer DCA Hb A1c Waived   CBC with Differential/Platelet   Lipid panel   TSH   Basic metabolic panel  with GFR   Hypothyroid   Relevant Orders   Bayer DCA Hb A1c Waived   CBC with Differential/Platelet   Lipid panel   TSH   Basic metabolic panel with GFR   Type 2 diabetes mellitus with other specified complication (HCC)   Other Visit Diagnoses       Blood in stool, frank       Relevant Orders   Ambulatory referral to Gastroenterology          Rectal bleeding due to diverticular disease Persistent bleeding despite holding Plavix . CT showed diverticular disease without infection. No hemorrhoids. No significant anemia. - Continue to hold Plavix . - Ordered blood count to monitor hemoglobin. - Urgent referral to gastroenterologist.  Type 2 diabetes mellitus with hypertension and hyperlipidemia A1c 6.1, good glycemic control. Blood pressure 125/78, normal.  Left knee pain status post meniscus repair with possible fracture Persistent pain post-surgery. MRI suggested possible fracture. - Continue follow up with orthopedic surgeon.          Follow up plan: Return in about 3 months (around 08/15/2024), or if symptoms worsen or fail to improve, for Diabetes recheck.  Counseling provided for all of the vaccine components Orders Placed This Encounter  Procedures   Bayer DCA Hb A1c Waived   CBC with  Differential/Platelet   Lipid panel   TSH   Basic metabolic panel with GFR   Ambulatory referral to Gastroenterology    Fonda Levins, MD [facility] 05/17/2024, 11:37 AM     "

## 2024-05-22 ENCOUNTER — Other Ambulatory Visit: Payer: Self-pay | Admitting: Family Medicine

## 2024-05-22 DIAGNOSIS — L309 Dermatitis, unspecified: Secondary | ICD-10-CM

## 2024-05-25 ENCOUNTER — Ambulatory Visit: Payer: Self-pay | Admitting: Family Medicine

## 2024-05-25 MED ORDER — LEVOTHYROXINE SODIUM 112 MCG PO TABS: 112.0000 ug | ORAL_TABLET | Freq: Every day | ORAL | 1 refills | Status: AC

## 2024-06-05 ENCOUNTER — Ambulatory Visit: Admitting: Physical Therapy

## 2024-06-11 ENCOUNTER — Ambulatory Visit: Admitting: Physical Therapy

## 2024-06-13 ENCOUNTER — Other Ambulatory Visit (HOSPITAL_BASED_OUTPATIENT_CLINIC_OR_DEPARTMENT_OTHER): Payer: Self-pay

## 2024-08-15 ENCOUNTER — Ambulatory Visit: Admitting: Family Medicine

## 2024-10-02 ENCOUNTER — Ambulatory Visit: Admitting: Adult Health

## 2024-12-11 ENCOUNTER — Ambulatory Visit: Payer: Self-pay
